# Patient Record
Sex: Male | Born: 1935 | Race: White | Hispanic: No | Marital: Single | Smoking: Former smoker
Health system: Southern US, Community
[De-identification: ages and names within clinical notes are randomized; demographics above are authoritative.]

## PROBLEM LIST (undated history)

## (undated) DIAGNOSIS — I351 Nonrheumatic aortic (valve) insufficiency: Secondary | ICD-10-CM

## (undated) DIAGNOSIS — Z720 Tobacco use: Secondary | ICD-10-CM

## (undated) DIAGNOSIS — C8510 Unspecified B-cell lymphoma, unspecified site: Secondary | ICD-10-CM

## (undated) DIAGNOSIS — I340 Nonrheumatic mitral (valve) insufficiency: Secondary | ICD-10-CM

## (undated) DIAGNOSIS — J439 Emphysema, unspecified: Secondary | ICD-10-CM

## (undated) DIAGNOSIS — E119 Type 2 diabetes mellitus without complications: Secondary | ICD-10-CM

## (undated) DIAGNOSIS — Z9221 Personal history of antineoplastic chemotherapy: Secondary | ICD-10-CM

## (undated) DIAGNOSIS — I6529 Occlusion and stenosis of unspecified carotid artery: Secondary | ICD-10-CM

## (undated) DIAGNOSIS — I2510 Atherosclerotic heart disease of native coronary artery without angina pectoris: Secondary | ICD-10-CM

## (undated) DIAGNOSIS — G5710 Meralgia paresthetica, unspecified lower limb: Secondary | ICD-10-CM

## (undated) DIAGNOSIS — I739 Peripheral vascular disease, unspecified: Secondary | ICD-10-CM

## (undated) DIAGNOSIS — K279 Peptic ulcer, site unspecified, unspecified as acute or chronic, without hemorrhage or perforation: Secondary | ICD-10-CM

## (undated) DIAGNOSIS — E785 Hyperlipidemia, unspecified: Secondary | ICD-10-CM

## (undated) DIAGNOSIS — D509 Iron deficiency anemia, unspecified: Secondary | ICD-10-CM

## (undated) DIAGNOSIS — D649 Anemia, unspecified: Secondary | ICD-10-CM

## (undated) DIAGNOSIS — J40 Bronchitis, not specified as acute or chronic: Secondary | ICD-10-CM

## (undated) DIAGNOSIS — I509 Heart failure, unspecified: Secondary | ICD-10-CM

## (undated) DIAGNOSIS — I10 Essential (primary) hypertension: Secondary | ICD-10-CM

## (undated) DIAGNOSIS — R600 Localized edema: Secondary | ICD-10-CM

## (undated) HISTORY — DX: Peptic ulcer, site unspecified, unspecified as acute or chronic, without hemorrhage or perforation: K27.9

## (undated) HISTORY — DX: Essential (primary) hypertension: I10

## (undated) HISTORY — DX: Localized edema: R60.0

## (undated) HISTORY — DX: Emphysema, unspecified: J43.9

## (undated) HISTORY — DX: Meralgia paresthetica, unspecified lower limb: G57.10

## (undated) HISTORY — DX: Occlusion and stenosis of unspecified carotid artery: I65.29

## (undated) HISTORY — DX: Iron deficiency anemia, unspecified: D50.9

## (undated) HISTORY — DX: Type 2 diabetes mellitus without complications: E11.9

## (undated) HISTORY — DX: Anemia, unspecified: D64.9

## (undated) HISTORY — DX: Bronchitis, not specified as acute or chronic: J40

## (undated) HISTORY — DX: Nonrheumatic aortic (valve) insufficiency: I35.1

## (undated) HISTORY — DX: Hyperlipidemia, unspecified: E78.5

## (undated) HISTORY — DX: Personal history of antineoplastic chemotherapy: Z92.21

## (undated) HISTORY — DX: Unspecified B-cell lymphoma, unspecified site: C85.10

## (undated) HISTORY — DX: Tobacco use: Z72.0

## (undated) HISTORY — DX: Atherosclerotic heart disease of native coronary artery without angina pectoris: I25.10

## (undated) HISTORY — DX: Nonrheumatic mitral (valve) insufficiency: I34.0

## (undated) HISTORY — DX: Peripheral vascular disease, unspecified: I73.9

---

## 2006-01-12 ENCOUNTER — Emergency Department: Payer: Self-pay | Admitting: Unknown Physician Specialty

## 2006-01-28 ENCOUNTER — Ambulatory Visit: Payer: Self-pay | Admitting: Surgery

## 2006-02-02 ENCOUNTER — Ambulatory Visit: Payer: Self-pay | Admitting: Surgery

## 2007-08-15 ENCOUNTER — Ambulatory Visit: Payer: Self-pay | Admitting: Cardiovascular Disease

## 2007-08-15 HISTORY — PX: CARDIAC CATHETERIZATION: SHX172

## 2007-11-30 DIAGNOSIS — C8510 Unspecified B-cell lymphoma, unspecified site: Secondary | ICD-10-CM

## 2007-11-30 HISTORY — DX: Unspecified B-cell lymphoma, unspecified site: C85.10

## 2008-09-03 ENCOUNTER — Ambulatory Visit: Payer: Self-pay | Admitting: Vascular Surgery

## 2008-10-09 ENCOUNTER — Ambulatory Visit: Payer: Self-pay | Admitting: Vascular Surgery

## 2008-10-16 ENCOUNTER — Inpatient Hospital Stay: Payer: Self-pay | Admitting: Vascular Surgery

## 2010-04-20 ENCOUNTER — Ambulatory Visit: Payer: Self-pay | Admitting: Rheumatology

## 2010-04-30 ENCOUNTER — Ambulatory Visit: Payer: Self-pay | Admitting: Oncology

## 2010-05-07 LAB — CBC WITH DIFFERENTIAL (CANCER CENTER ONLY)
BASO%: 0.5 % (ref 0.0–2.0)
Eosinophils Absolute: 0.3 10*3/uL (ref 0.0–0.5)
LYMPH#: 1.1 10*3/uL (ref 0.9–3.3)
LYMPH%: 15.8 % (ref 14.0–48.0)
MCV: 93 fL (ref 82–98)
MONO#: 0.5 10*3/uL (ref 0.1–0.9)
NEUT#: 4.8 10*3/uL (ref 1.5–6.5)
Platelets: 254 10*3/uL (ref 145–400)
RBC: 3.93 10*6/uL — ABNORMAL LOW (ref 4.20–5.70)
WBC: 6.7 10*3/uL (ref 4.0–10.0)

## 2010-05-07 LAB — CMP (CANCER CENTER ONLY)
AST: 18 U/L (ref 11–38)
Alkaline Phosphatase: 98 U/L — ABNORMAL HIGH (ref 26–84)
BUN, Bld: 30 mg/dL — ABNORMAL HIGH (ref 7–22)
Creat: 1.5 mg/dl — ABNORMAL HIGH (ref 0.6–1.2)
Glucose, Bld: 170 mg/dL — ABNORMAL HIGH (ref 73–118)
Total Bilirubin: 0.5 mg/dl (ref 0.20–1.60)

## 2010-05-07 LAB — MORPHOLOGY - CHCC SATELLITE
PLT EST ~~LOC~~: ADEQUATE
RBC Comments: NORMAL

## 2010-05-08 LAB — RETICULOCYTES (CHCC): Retic Ct Pct: 1.2 % (ref 0.4–3.1)

## 2010-05-08 LAB — IRON AND TIBC
%SAT: 16 % — ABNORMAL LOW (ref 20–55)
Iron: 49 ug/dL (ref 42–165)
UIBC: 255 ug/dL

## 2010-05-08 LAB — FERRITIN: Ferritin: 111 ng/mL (ref 22–322)

## 2010-05-08 LAB — BETA 2 MICROGLOBULIN, SERUM: Beta-2 Microglobulin: 2.87 mg/L — ABNORMAL HIGH (ref 1.01–1.73)

## 2010-05-13 ENCOUNTER — Ambulatory Visit: Payer: Self-pay | Admitting: Internal Medicine

## 2010-08-13 ENCOUNTER — Ambulatory Visit: Payer: Self-pay | Admitting: Oncology

## 2012-04-26 ENCOUNTER — Ambulatory Visit: Payer: Self-pay | Admitting: Cardiovascular Disease

## 2013-03-29 HISTORY — PX: ESOPHAGOGASTRODUODENOSCOPY: SHX1529

## 2013-03-29 HISTORY — PX: COLONOSCOPY: SHX174

## 2013-07-17 ENCOUNTER — Ambulatory Visit: Payer: Self-pay | Admitting: Cardiovascular Disease

## 2013-11-05 ENCOUNTER — Ambulatory Visit: Payer: Self-pay | Admitting: Internal Medicine

## 2013-11-19 ENCOUNTER — Ambulatory Visit: Payer: Self-pay | Admitting: Internal Medicine

## 2013-11-29 ENCOUNTER — Ambulatory Visit: Payer: Self-pay | Admitting: Internal Medicine

## 2014-04-25 ENCOUNTER — Ambulatory Visit: Payer: Self-pay | Admitting: Internal Medicine

## 2014-07-30 ENCOUNTER — Emergency Department: Payer: Self-pay | Admitting: Emergency Medicine

## 2015-10-14 ENCOUNTER — Inpatient Hospital Stay: Payer: Medicare PPO | Attending: Internal Medicine | Admitting: Internal Medicine

## 2015-10-14 ENCOUNTER — Encounter: Payer: Self-pay | Admitting: *Deleted

## 2015-10-14 ENCOUNTER — Other Ambulatory Visit: Payer: Self-pay | Admitting: *Deleted

## 2015-10-14 DIAGNOSIS — C8510 Unspecified B-cell lymphoma, unspecified site: Secondary | ICD-10-CM | POA: Insufficient documentation

## 2015-11-04 ENCOUNTER — Ambulatory Visit: Payer: Medicare PPO | Admitting: Internal Medicine

## 2015-12-25 ENCOUNTER — Inpatient Hospital Stay: Payer: Medicare PPO

## 2016-01-01 ENCOUNTER — Inpatient Hospital Stay: Payer: Medicare PPO | Attending: Internal Medicine

## 2016-01-24 ENCOUNTER — Encounter: Payer: Self-pay | Admitting: Emergency Medicine

## 2016-01-24 ENCOUNTER — Emergency Department
Admission: EM | Admit: 2016-01-24 | Discharge: 2016-01-24 | Disposition: A | Discharge status: Home / Self Care | Payer: Medicare PPO | Attending: Emergency Medicine | Admitting: Emergency Medicine

## 2016-01-24 ENCOUNTER — Emergency Department: Payer: Medicare PPO

## 2016-01-24 DIAGNOSIS — Z7984 Long term (current) use of oral hypoglycemic drugs: Secondary | ICD-10-CM | POA: Diagnosis not present

## 2016-01-24 DIAGNOSIS — D649 Anemia, unspecified: Secondary | ICD-10-CM | POA: Insufficient documentation

## 2016-01-24 DIAGNOSIS — Z7982 Long term (current) use of aspirin: Secondary | ICD-10-CM | POA: Insufficient documentation

## 2016-01-24 DIAGNOSIS — Y9289 Other specified places as the place of occurrence of the external cause: Secondary | ICD-10-CM | POA: Insufficient documentation

## 2016-01-24 DIAGNOSIS — Y998 Other external cause status: Secondary | ICD-10-CM | POA: Insufficient documentation

## 2016-01-24 DIAGNOSIS — Z79899 Other long term (current) drug therapy: Secondary | ICD-10-CM | POA: Insufficient documentation

## 2016-01-24 DIAGNOSIS — Z7902 Long term (current) use of antithrombotics/antiplatelets: Secondary | ICD-10-CM | POA: Insufficient documentation

## 2016-01-24 DIAGNOSIS — Z87891 Personal history of nicotine dependence: Secondary | ICD-10-CM | POA: Diagnosis not present

## 2016-01-24 DIAGNOSIS — S29002A Unspecified injury of muscle and tendon of back wall of thorax, initial encounter: Secondary | ICD-10-CM | POA: Diagnosis present

## 2016-01-24 DIAGNOSIS — S20221A Contusion of right back wall of thorax, initial encounter: Secondary | ICD-10-CM | POA: Insufficient documentation

## 2016-01-24 DIAGNOSIS — I10 Essential (primary) hypertension: Secondary | ICD-10-CM | POA: Insufficient documentation

## 2016-01-24 DIAGNOSIS — Y9389 Activity, other specified: Secondary | ICD-10-CM | POA: Diagnosis not present

## 2016-01-24 DIAGNOSIS — E785 Hyperlipidemia, unspecified: Secondary | ICD-10-CM | POA: Insufficient documentation

## 2016-01-24 DIAGNOSIS — E119 Type 2 diabetes mellitus without complications: Secondary | ICD-10-CM | POA: Insufficient documentation

## 2016-01-24 MED ORDER — TRAMADOL HCL 50 MG PO TABS: 50.0000 mg | ORAL_TABLET | Freq: Two times a day (BID) | ORAL | Status: DC | PRN

## 2016-01-24 MED ORDER — CARISOPRODOL 350 MG PO TABS: 350.0000 mg | ORAL_TABLET | Freq: Four times a day (QID) | ORAL | Status: DC | PRN
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
## 2016-03-18 ENCOUNTER — Telehealth: Payer: Self-pay | Admitting: Hematology and Oncology

## 2016-03-18 NOTE — Telephone Encounter (Signed)
Dr Alvy Bimler  Recommends pt to see Dr.  Mike Gip.

## 2016-04-29 ENCOUNTER — Other Ambulatory Visit: Payer: Self-pay | Admitting: Nephrology

## 2016-04-29 DIAGNOSIS — N183 Chronic kidney disease, stage 3 unspecified: Secondary | ICD-10-CM

## 2016-04-29 DIAGNOSIS — N179 Acute kidney failure, unspecified: Secondary | ICD-10-CM

## 2016-05-05 ENCOUNTER — Ambulatory Visit: Payer: Medicare PPO

## 2016-06-17 ENCOUNTER — Emergency Department: Payer: Medicare PPO

## 2016-06-17 ENCOUNTER — Inpatient Hospital Stay
Admission: EM | Admit: 2016-06-17 | Discharge: 2016-06-21 | DRG: 191 | Disposition: A | Discharge status: Home / Self Care | Payer: Medicare PPO | Attending: Internal Medicine | Admitting: Internal Medicine

## 2016-06-17 DIAGNOSIS — I25110 Atherosclerotic heart disease of native coronary artery with unstable angina pectoris: Secondary | ICD-10-CM | POA: Diagnosis present

## 2016-06-17 DIAGNOSIS — E1165 Type 2 diabetes mellitus with hyperglycemia: Secondary | ICD-10-CM | POA: Diagnosis present

## 2016-06-17 DIAGNOSIS — D509 Iron deficiency anemia, unspecified: Secondary | ICD-10-CM | POA: Diagnosis present

## 2016-06-17 DIAGNOSIS — Z9221 Personal history of antineoplastic chemotherapy: Secondary | ICD-10-CM

## 2016-06-17 DIAGNOSIS — Z9889 Other specified postprocedural states: Secondary | ICD-10-CM

## 2016-06-17 DIAGNOSIS — R0600 Dyspnea, unspecified: Secondary | ICD-10-CM | POA: Diagnosis not present

## 2016-06-17 DIAGNOSIS — I480 Paroxysmal atrial fibrillation: Secondary | ICD-10-CM | POA: Diagnosis present

## 2016-06-17 DIAGNOSIS — Z8711 Personal history of peptic ulcer disease: Secondary | ICD-10-CM

## 2016-06-17 DIAGNOSIS — T380X5A Adverse effect of glucocorticoids and synthetic analogues, initial encounter: Secondary | ICD-10-CM | POA: Diagnosis present

## 2016-06-17 DIAGNOSIS — Z79899 Other long term (current) drug therapy: Secondary | ICD-10-CM

## 2016-06-17 DIAGNOSIS — J449 Chronic obstructive pulmonary disease, unspecified: Secondary | ICD-10-CM | POA: Diagnosis present

## 2016-06-17 DIAGNOSIS — E1151 Type 2 diabetes mellitus with diabetic peripheral angiopathy without gangrene: Secondary | ICD-10-CM | POA: Diagnosis present

## 2016-06-17 DIAGNOSIS — I4892 Unspecified atrial flutter: Secondary | ICD-10-CM | POA: Diagnosis present

## 2016-06-17 DIAGNOSIS — J441 Chronic obstructive pulmonary disease with (acute) exacerbation: Secondary | ICD-10-CM | POA: Diagnosis present

## 2016-06-17 DIAGNOSIS — C8510 Unspecified B-cell lymphoma, unspecified site: Secondary | ICD-10-CM | POA: Diagnosis present

## 2016-06-17 DIAGNOSIS — I10 Essential (primary) hypertension: Secondary | ICD-10-CM | POA: Diagnosis present

## 2016-06-17 DIAGNOSIS — R0603 Acute respiratory distress: Secondary | ICD-10-CM

## 2016-06-17 DIAGNOSIS — Z7982 Long term (current) use of aspirin: Secondary | ICD-10-CM

## 2016-06-17 DIAGNOSIS — Z87891 Personal history of nicotine dependence: Secondary | ICD-10-CM

## 2016-06-17 DIAGNOSIS — R079 Chest pain, unspecified: Secondary | ICD-10-CM

## 2016-06-17 LAB — BASIC METABOLIC PANEL
ANION GAP: 8 (ref 5–15)
BUN: 36 mg/dL — AB (ref 6–20)
CALCIUM: 8.3 mg/dL — AB (ref 8.9–10.3)
CO2: 28 mmol/L (ref 22–32)
CREATININE: 1.72 mg/dL — AB (ref 0.61–1.24)
Chloride: 104 mmol/L (ref 101–111)
GFR calc Af Amer: 42 mL/min — ABNORMAL LOW (ref >60)
GFR, EST NON AFRICAN AMERICAN: 36 mL/min — AB (ref >60)
GLUCOSE: 143 mg/dL — AB (ref 65–99)
Potassium: 3.9 mmol/L (ref 3.5–5.1)
Sodium: 140 mmol/L (ref 135–145)

## 2016-06-17 LAB — CBC
HCT: 28.8 % — ABNORMAL LOW (ref 40.0–52.0)
HEMOGLOBIN: 9.5 g/dL — AB (ref 13.0–18.0)
MCH: 25.6 pg — AB (ref 26.0–34.0)
MCHC: 32.8 g/dL (ref 32.0–36.0)
MCV: 78.1 fL — ABNORMAL LOW (ref 80.0–100.0)
PLATELETS: 322 10*3/uL (ref 150–440)
RBC: 3.69 MIL/uL — ABNORMAL LOW (ref 4.40–5.90)
RDW: 18.7 % — AB (ref 11.5–14.5)
WBC: 11.6 10*3/uL — ABNORMAL HIGH (ref 3.8–10.6)

## 2016-06-17 LAB — TROPONIN I

## 2016-06-17 MED ORDER — ALBUTEROL SULFATE (2.5 MG/3ML) 0.083% IN NEBU
5.0000 mg | INHALATION_SOLUTION | Freq: Once | RESPIRATORY_TRACT | Status: AC
  Administered 2016-06-17: 5 mg via RESPIRATORY_TRACT

## 2016-06-17 MED ORDER — ASPIRIN 81 MG PO CHEW
324.0000 mg | CHEWABLE_TABLET | Freq: Once | ORAL | Status: AC
  Administered 2016-06-18: 324 mg via ORAL
  Filled 2016-06-17: qty 4

## 2016-06-17 MED ORDER — METHYLPREDNISOLONE SODIUM SUCC 125 MG IJ SOLR
125.0000 mg | Freq: Once | INTRAMUSCULAR | Status: AC
  Administered 2016-06-17: 125 mg via INTRAVENOUS

## 2016-06-17 MED ORDER — IPRATROPIUM-ALBUTEROL 0.5-2.5 (3) MG/3ML IN SOLN
3.0000 mL | Freq: Once | RESPIRATORY_TRACT | Status: AC
  Administered 2016-06-17: 3 mL via RESPIRATORY_TRACT

## 2016-06-17 MED ORDER — IPRATROPIUM-ALBUTEROL 0.5-2.5 (3) MG/3ML IN SOLN
RESPIRATORY_TRACT | Status: AC
  Filled 2016-06-17: qty 6

## 2016-06-17 MED ORDER — METHYLPREDNISOLONE SODIUM SUCC 125 MG IJ SOLR
INTRAMUSCULAR | Status: AC
  Filled 2016-06-17: qty 2

## 2016-06-17 MED ORDER — ALBUTEROL SULFATE (2.5 MG/3ML) 0.083% IN NEBU
INHALATION_SOLUTION | RESPIRATORY_TRACT | Status: AC
  Filled 2016-06-17: qty 6

## 2016-06-17 NOTE — ED Provider Notes (Signed)
[facility] Emergency Department Provider Note  ____________________________________________  Time seen: 11:00 PM  I have reviewed the triage vital signs and the nursing notes.   HISTORY  Chief Complaint Chest Pain and Shortness of Breath     HPI Brett [name] is a 81 y.o. male presents with acute onset of chest pain shortness of breath at 12:00 today. Patient states that the pain is central describes it as pressure radiating to his left neck and left arm. Patient also admits to dyspnea. Patient denies any auditory pain swelling. Patient denies any previous cardiac history or history of pulmonary emboli. Patient does admit to many years of tobacco use however stated that he quit in January 2017.     Past Medical History  Diagnosis Date  . Aortic regurgitation   . Mitral regurgitation   . Essential hypertension   . PVD (peripheral vascular disease) ([place])   . Bronchitis   . Hyperlipidemia   . Diabetes mellitus, type 2 ([place])   . CAD (coronary artery disease)   . B-cell lymphoma ([place]) 2009    DX AT [facility]  . History of chemotherapy   . PUD (peptic ulcer disease)   . Leg edema   . Meralgia paraesthetica   . Tobacco abuse   . Emphysema of lung ([place])   . Carotid stenosis   . Anemia   . IDA (iron deficiency anemia)     Patient Active Problem List   Diagnosis Date Noted  . B-cell lymphoma ([place]) 10/14/2015    Past Surgical History  Procedure Laterality Date  . Cardiac catheterization  08/15/2007  . Esophagogastroduodenoscopy  03/2013  . Colonoscopy  03/2013    Current Outpatient Rx  Name  Route  Sig  Dispense  Refill  . albuterol (VENTOLIN HFA) 108 (90 BASE) MCG/ACT inhaler      use as directed         . amLODipine (NORVASC) 5 MG tablet               . aspirin (ADULT ASPIRIN EC LOW STRENGTH) 81 MG EC tablet   Oral   Take 1 tablet by mouth daily.         . clopidogrel (PLAVIX) 75 MG tablet               . DHA-EPA-VITAMIN E  PO   Oral   Take by mouth.         . Fe Fum-FA-B Cmp-C-Zn-Mg-Mn-Cu (HEMOCYTE-PLUS) 106-1 MG TABS   Oral   Take 1 tablet by mouth daily.         . furosemide (LASIX) 40 MG tablet   Oral   Take 1 tablet by mouth daily.         Marland Kitchen gabapentin (NEURONTIN) 100 MG capsule   Oral   Take 1 capsule by mouth daily.         . Garlic [id number] MG CAPS   Oral   Take by mouth.         Marland Kitchen glimepiride (AMARYL) 1 MG tablet   Oral   Take 1 tablet by mouth daily.         . hydrALAZINE (APRESOLINE) 100 MG tablet               . hydrochlorothiazide (HYDRODIURIL) 25 MG tablet   Oral   Take 25 mg by mouth daily.      3   . HYDROcodone-ibuprofen (VICOPROFEN) 7.5-200 MG tablet               .  losartan-hydrochlorothiazide (HYZAAR) 100-25 MG tablet               . metoprolol (TOPROL-XL) 200 MG 24 hr tablet   Oral   Take 1 tablet by mouth daily.         Marland Kitchen omeprazole (PRILOSEC) 40 MG capsule   Oral   Take 1 capsule by mouth every morning.      1   . PREVNAR 13 SUSP injection      TO BE ADMINISTERED BY PHARMACIST FOR IMMUNIZATION      0     Dispense as written.   . simvastatin (ZOCOR) 40 MG tablet   Oral   Take by mouth.         . traMADol (ULTRAM) 50 MG tablet   Oral   Take 1 tablet (50 mg total) by mouth every 12 (twelve) hours as needed for moderate pain.   12 tablet   0   . traMADol (ULTRAM) 50 MG tablet   Oral   Take 1 tablet (50 mg total) by mouth every 12 (twelve) hours as needed for moderate pain.   12 tablet   0   . Vitamin D, Cholecalciferol, 1000 UNITS CAPS      once a day           Allergies No known drug allergies No family history on file.  Social History Social History  Substance Use Topics  . Smoking status: Former Smoker    Types: Cigarettes    Quit date: 08/30/2015  . Smokeless tobacco: Never Used  . Alcohol Use: No    Review of Systems  Constitutional: Negative for fever. Eyes: Negative for visual changes. ENT:  Negative for sore throat. Cardiovascular: Negative for chest pain. Respiratory: Negative for shortness of breath. Gastrointestinal: Negative for abdominal pain, vomiting and diarrhea. Genitourinary: Negative for dysuria. Musculoskeletal: Negative for back pain. Skin: Negative for rash. Neurological: Negative for headaches, focal weakness or numbness.   10-point ROS otherwise negative.  ____________________________________________   PHYSICAL EXAM:  VITAL SIGNS: ED Triage Vitals  Enc Vitals Group     BP 06/17/16 1812 122/48 mmHg     Pulse Rate 06/17/16 1812 86     Resp 06/17/16 1812 26     Temp 06/17/16 1812 98.2 F (36.8 C)     Temp Source 06/17/16 1812 Oral     SpO2 06/17/16 1812 94 %     Weight 06/17/16 1812 185 lb (83.915 kg)     Height --      Head Cir --      Peak Flow --      Pain Score 06/17/16 1812 7     Pain Loc --      Pain Edu? --      Excl. in [place]? --      Constitutional: Alert and oriented. Well appearing and in no distress. Eyes: Conjunctivae are normal. PERRL. Normal extraocular movements. ENT   Head: Normocephalic and atraumatic.   Nose: No congestion/rhinnorhea.   Mouth/Throat: Mucous membranes are moist.   Neck: No stridor. Hematological/Lymphatic/Immunilogical: No cervical lymphadenopathy. Cardiovascular: Normal rate, regular rhythm. Normal and symmetric distal pulses are present in all extremities. No murmurs, rubs, or gallops. Respiratory:Tachypnea with accessory respiratory muscle use. Coarse extremity wheezes Gastrointestinal: Soft and nontender. No distention. There is no CVA tenderness. Genitourinary: deferred Musculoskeletal: Nontender with normal range of motion in all extremities. No joint effusions.  No lower extremity tenderness nor edema. Neurologic:  Normal speech and language. No gross focal  neurologic deficits are appreciated. Speech is normal.  Skin:  Skin is warm, dry and intact. No rash noted. Psychiatric: Mood and  affect are normal. Speech and behavior are normal. Patient exhibits appropriate insight and judgment.  ____________________________________________    LABS (pertinent positives/negatives)  Labs Reviewed  BASIC METABOLIC PANEL - Abnormal; Notable for the following:    Glucose, Bld 143 (*)    BUN 36 (*)    Creatinine, Ser 1.72 (*)    Calcium 8.3 (*)    GFR calc non Af Amer 36 (*)    GFR calc Af Amer 42 (*)    All other components within normal limits  CBC - Abnormal; Notable for the following:    WBC 11.6 (*)    RBC 3.69 (*)    Hemoglobin 9.5 (*)    HCT 28.8 (*)    MCV 78.1 (*)    MCH 25.6 (*)    RDW 18.7 (*)    All other components within normal limits  TROPONIN I  BLOOD GAS, VENOUS     ____________________________________________   EKG ED ECG REPORT I, Yemas[place] N BROWN, the attending physician, personally viewed and interpreted this ECG.   Date: 06/18/2016  EKG Time: 6:11 PM  Rate: 87  Rhythm: Normal sinus rhythm  Axis: Normal  Intervals: Normal  ST&T Change: None   ____________________________________________    RADIOLOGY      DG Chest 2 View (Final result) Result time: 06/17/16 19:00:55   Final result by Rad Results In Interface (06/17/16 19:00:55)   Narrative:   CLINICAL DATA: Acute onset chest pain and shortness of breath today. Tachypnea.  EXAM: CHEST 2 VIEW  COMPARISON: None.  FINDINGS: Mild cardiomegaly noted. Mild bibasilar scarring or atelectasis noted. No evidence of pulmonary consolidation or edema. No evidence of pleural effusion.  IMPRESSION: Mild cardiomegaly and bibasilar scarring versus atelectasis. No evidence of pulmonary consolidation or edema.   Electronically Signed By: Earle Gell M.D. On: 06/17/2016 19:00          Procedures   Critical Care performed: CRITICAL CARE Performed by: Gregor Hams   Total critical care time: 30 minutes  Critical care time was exclusive of separately billable  procedures and treating other patients.  Critical care was necessary to treat or prevent imminent or life-threatening deterioration.  Critical care was time spent personally by me on the following activities: development of treatment plan with patient and/or surrogate as well as nursing, discussions with consultants, evaluation of patient's response to treatment, examination of patient, obtaining history from patient or surrogate, ordering and performing treatments and interventions, ordering and review of laboratory studies, ordering and review of radiographic studies, pulse oximetry and re-evaluation of patient's condition.   ____________________________________________   INITIAL IMPRESSION / ASSESSMENT AND PLAN / ED COURSE  Pertinent labs & imaging results that were available during my care of the patient were reviewed by me and considered in my medical decision making (see chart for details).  Patient apparent respiratory difficulty as such DuoNeb's 2 given his IV Solu-Medrol 125 mg. Patient given aspirin 324 mg  ____________________________________________   FINAL CLINICAL IMPRESSION(S) / ED DIAGNOSES  Final diagnoses:  Chest pain, unspecified chest pain type  Respiratory distress      Gregor Hams, MD 06/18/16 0126
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
## 2016-06-17 NOTE — ED Notes (Signed)
Pt c/o of right and left chest pain beginning at 12 pm today that radiates to neck, back, and both arms. Pt reports he had similar pain last Sunday. Pt reports SOB, weakness, dizziness/lightheadedness, and nausea accompany the pain. Pt is diaphoretic.

## 2016-06-17 NOTE — ED Notes (Signed)
Patient relations came to this RN to report concerns related to patient status; primary nurse has not been in with patient. Patient noted to be SOB in bed. Pulse oximeter attached; result 82%. Patient placed on supplemental oxygen at 2L/[place]; sats improved to 88%. Oxygen titrated up to 4L/[place]; sats 94%. Primary nurse made aware.

## 2016-06-18 ENCOUNTER — Encounter: Payer: Self-pay | Admitting: Internal Medicine

## 2016-06-18 DIAGNOSIS — R079 Chest pain, unspecified: Secondary | ICD-10-CM | POA: Diagnosis present

## 2016-06-18 DIAGNOSIS — J441 Chronic obstructive pulmonary disease with (acute) exacerbation: Secondary | ICD-10-CM | POA: Diagnosis present

## 2016-06-18 LAB — CBC
HEMATOCRIT: 28.5 % — AB (ref 40.0–52.0)
HEMOGLOBIN: 9.2 g/dL — AB (ref 13.0–18.0)
MCH: 25.3 pg — ABNORMAL LOW (ref 26.0–34.0)
MCHC: 32.4 g/dL (ref 32.0–36.0)
MCV: 78.3 fL — ABNORMAL LOW (ref 80.0–100.0)
Platelets: 308 10*3/uL (ref 150–440)
RBC: 3.65 MIL/uL — AB (ref 4.40–5.90)
RDW: 18.5 % — ABNORMAL HIGH (ref 11.5–14.5)
WBC: 14.1 10*3/uL — AB (ref 3.8–10.6)

## 2016-06-18 LAB — BASIC METABOLIC PANEL
Anion gap: 10 (ref 5–15)
BUN: 39 mg/dL — ABNORMAL HIGH (ref 6–20)
CALCIUM: 8.2 mg/dL — AB (ref 8.9–10.3)
CO2: 28 mmol/L (ref 22–32)
CREATININE: 2.04 mg/dL — AB (ref 0.61–1.24)
Chloride: 103 mmol/L (ref 101–111)
GFR calc Af Amer: 34 mL/min — ABNORMAL LOW (ref >60)
GFR calc non Af Amer: 29 mL/min — ABNORMAL LOW (ref >60)
GLUCOSE: 354 mg/dL — AB (ref 65–99)
Potassium: 4.5 mmol/L (ref 3.5–5.1)
Sodium: 141 mmol/L (ref 135–145)

## 2016-06-18 LAB — BLOOD GAS, VENOUS
Acid-Base Excess: 5.6 mmol/L — ABNORMAL HIGH (ref 0.0–3.0)
Bicarbonate: 31.1 mEq/L — ABNORMAL HIGH (ref 21.0–28.0)
O2 Saturation: 70.3 %
PCO2 VEN: 48 mmHg (ref 44.0–60.0)
PH VEN: 7.42 (ref 7.320–7.430)
PO2 VEN: 36 mmHg (ref 31.0–45.0)
Patient temperature: 37

## 2016-06-18 LAB — GLUCOSE, CAPILLARY
GLUCOSE-CAPILLARY: 383 mg/dL — AB (ref 65–99)
GLUCOSE-CAPILLARY: 432 mg/dL — AB (ref 65–99)
GLUCOSE-CAPILLARY: 468 mg/dL — AB (ref 65–99)
GLUCOSE-CAPILLARY: 326 mg/dL — AB (ref 65–99)
Glucose-Capillary: 363 mg/dL — ABNORMAL HIGH (ref 65–99)
Glucose-Capillary: 423 mg/dL — ABNORMAL HIGH (ref 65–99)
Glucose-Capillary: 275 mg/dL — ABNORMAL HIGH (ref 65–99)

## 2016-06-18 LAB — TROPONIN I: Troponin I: <0.03 ng/mL (ref <0.03)

## 2016-06-18 LAB — LIPID PANEL
Cholesterol: 121 mg/dL (ref 0–200)
HDL: 30 mg/dL — ABNORMAL LOW (ref >40)
LDL CALC: 63 mg/dL (ref 0–99)
Total CHOL/HDL Ratio: 4 RATIO
Triglycerides: 141 mg/dL (ref <150)
VLDL: 28 mg/dL (ref 0–40)

## 2016-06-18 LAB — FIBRIN DERIVATIVES D-DIMER (ARMC ONLY): FIBRIN DERIVATIVES D-DIMER (ARMC): 1781 — AB (ref 0–499)

## 2016-06-18 MED ORDER — CLOPIDOGREL BISULFATE 75 MG PO TABS
75.0000 mg | ORAL_TABLET | Freq: Every day | ORAL | Status: DC
  Administered 2016-06-18 – 2016-06-21 (×4): 75 mg via ORAL
  Filled 2016-06-18 (×4): qty 1

## 2016-06-18 MED ORDER — INSULIN ASPART 100 UNIT/ML ~~LOC~~ SOLN
0.0000 [IU] | Freq: Every day | SUBCUTANEOUS | Status: DC
  Administered 2016-06-18: 3 [IU] via SUBCUTANEOUS
  Filled 2016-06-18: qty 3

## 2016-06-18 MED ORDER — INSULIN ASPART 100 UNIT/ML ~~LOC~~ SOLN: 0.0000 [IU] | Freq: Every day | SUBCUTANEOUS | Status: DC

## 2016-06-18 MED ORDER — SODIUM CHLORIDE 0.9 % IV BOLUS (SEPSIS)
500.0000 mL | Freq: Once | INTRAVENOUS | Status: AC
  Administered 2016-06-18: 500 mL via INTRAVENOUS

## 2016-06-18 MED ORDER — ENOXAPARIN SODIUM 40 MG/0.4ML ~~LOC~~ SOLN
40.0000 mg | Freq: Every day | SUBCUTANEOUS | Status: DC
Start: 2016-06-18 — End: 2016-06-18
  Administered 2016-06-18: 40 mg via SUBCUTANEOUS
  Filled 2016-06-18: qty 0.4

## 2016-06-18 MED ORDER — SODIUM CHLORIDE 0.9% FLUSH: 3.0000 mL | INTRAVENOUS | Status: DC | PRN

## 2016-06-18 MED ORDER — INSULIN ASPART 100 UNIT/ML ~~LOC~~ SOLN
0.0000 [IU] | Freq: Three times a day (TID) | SUBCUTANEOUS | Status: DC
  Administered 2016-06-18: 20 [IU] via SUBCUTANEOUS
  Administered 2016-06-19: 11 [IU] via SUBCUTANEOUS
  Administered 2016-06-19: 10 [IU] via SUBCUTANEOUS
  Administered 2016-06-19: 20 [IU] via SUBCUTANEOUS
  Filled 2016-06-18 (×2): qty 20
  Filled 2016-06-18: qty 10
  Filled 2016-06-18: qty 11
  Filled 2016-06-18: qty 20

## 2016-06-18 MED ORDER — PANTOPRAZOLE SODIUM 40 MG PO TBEC
40.0000 mg | DELAYED_RELEASE_TABLET | Freq: Every day | ORAL | Status: DC
  Administered 2016-06-18 – 2016-06-21 (×4): 40 mg via ORAL
  Filled 2016-06-18 (×4): qty 1

## 2016-06-18 MED ORDER — ACETAMINOPHEN 325 MG PO TABS: 650.0000 mg | ORAL_TABLET | ORAL | Status: DC | PRN

## 2016-06-18 MED ORDER — ONDANSETRON HCL 4 MG/2ML IJ SOLN: 4.0000 mg | Freq: Four times a day (QID) | INTRAMUSCULAR | Status: DC | PRN

## 2016-06-18 MED ORDER — AMLODIPINE BESYLATE 5 MG PO TABS
5.0000 mg | ORAL_TABLET | Freq: Every day | ORAL | Status: DC
  Administered 2016-06-18: 5 mg via ORAL
  Filled 2016-06-18: qty 1

## 2016-06-18 MED ORDER — ATORVASTATIN CALCIUM 20 MG PO TABS
20.0000 mg | ORAL_TABLET | Freq: Every day | ORAL | Status: DC
  Administered 2016-06-18 – 2016-06-21 (×4): 20 mg via ORAL
  Filled 2016-06-18 (×5): qty 1

## 2016-06-18 MED ORDER — ASPIRIN 81 MG PO TBEC: 81.0000 mg | DELAYED_RELEASE_TABLET | Freq: Every day | ORAL | Status: DC

## 2016-06-18 MED ORDER — ASPIRIN EC 81 MG PO TBEC
81.0000 mg | DELAYED_RELEASE_TABLET | Freq: Every day | ORAL | Status: DC
  Administered 2016-06-19 – 2016-06-21 (×3): 81 mg via ORAL
  Filled 2016-06-18 (×4): qty 1

## 2016-06-18 MED ORDER — ENOXAPARIN SODIUM 40 MG/0.4ML ~~LOC~~ SOLN: 40.0000 mg | Freq: Every day | SUBCUTANEOUS | Status: DC

## 2016-06-18 MED ORDER — SODIUM CHLORIDE 0.9 % IV SOLN: 250.0000 mL | INTRAVENOUS | Status: DC | PRN

## 2016-06-18 MED ORDER — LOSARTAN POTASSIUM-HCTZ 100-25 MG PO TABS: 1.0000 | ORAL_TABLET | Freq: Every day | ORAL | Status: DC

## 2016-06-18 MED ORDER — ASPIRIN 81 MG PO CHEW: 324.0000 mg | CHEWABLE_TABLET | ORAL | Status: DC

## 2016-06-18 MED ORDER — METHYLPREDNISOLONE SODIUM SUCC 125 MG IJ SOLR
60.0000 mg | Freq: Four times a day (QID) | INTRAMUSCULAR | Status: DC
  Administered 2016-06-18 (×2): 60 mg via INTRAVENOUS
  Filled 2016-06-18 (×2): qty 2

## 2016-06-18 MED ORDER — HYDRALAZINE HCL 25 MG PO TABS
25.0000 mg | ORAL_TABLET | Freq: Three times a day (TID) | ORAL | Status: DC
  Administered 2016-06-18 – 2016-06-21 (×8): 25 mg via ORAL
  Filled 2016-06-18 (×12): qty 1

## 2016-06-18 MED ORDER — SODIUM CHLORIDE 0.9% FLUSH
3.0000 mL | Freq: Two times a day (BID) | INTRAVENOUS | Status: DC
  Administered 2016-06-18 – 2016-06-21 (×6): 3 mL via INTRAVENOUS

## 2016-06-18 MED ORDER — INSULIN ASPART 100 UNIT/ML ~~LOC~~ SOLN
10.0000 [IU] | Freq: Once | SUBCUTANEOUS | Status: AC
  Administered 2016-06-18: 10 [IU] via SUBCUTANEOUS
  Filled 2016-06-18: qty 10

## 2016-06-18 MED ORDER — IPRATROPIUM-ALBUTEROL 0.5-2.5 (3) MG/3ML IN SOLN
3.0000 mL | Freq: Four times a day (QID) | RESPIRATORY_TRACT | Status: DC
  Administered 2016-06-18 – 2016-06-21 (×12): 3 mL via RESPIRATORY_TRACT
  Filled 2016-06-18 (×13): qty 3

## 2016-06-18 MED ORDER — METHYLPREDNISOLONE SODIUM SUCC 40 MG IJ SOLR
40.0000 mg | Freq: Three times a day (TID) | INTRAMUSCULAR | Status: DC
  Administered 2016-06-18 – 2016-06-19 (×2): 40 mg via INTRAVENOUS
  Filled 2016-06-18 (×2): qty 1

## 2016-06-18 MED ORDER — NITROGLYCERIN 0.4 MG SL SUBL: 0.4000 mg | SUBLINGUAL_TABLET | SUBLINGUAL | Status: DC | PRN

## 2016-06-18 MED ORDER — ASPIRIN 300 MG RE SUPP: 300.0000 mg | RECTAL | Status: DC

## 2016-06-18 MED ORDER — HYDROCHLOROTHIAZIDE 25 MG PO TABS
25.0000 mg | ORAL_TABLET | Freq: Every day | ORAL | Status: DC
  Administered 2016-06-18 – 2016-06-21 (×4): 25 mg via ORAL
  Filled 2016-06-18 (×4): qty 1

## 2016-06-18 MED ORDER — INSULIN ASPART 100 UNIT/ML ~~LOC~~ SOLN
0.0000 [IU] | Freq: Three times a day (TID) | SUBCUTANEOUS | Status: DC
  Administered 2016-06-18 (×2): 15 [IU] via SUBCUTANEOUS
  Filled 2016-06-18 (×2): qty 15

## 2016-06-18 MED ORDER — GABAPENTIN 100 MG PO CAPS
100.0000 mg | ORAL_CAPSULE | Freq: Every day | ORAL | Status: DC
  Administered 2016-06-18 – 2016-06-20 (×3): 100 mg via ORAL
  Filled 2016-06-18 (×3): qty 1

## 2016-06-18 MED ORDER — LOSARTAN POTASSIUM 50 MG PO TABS
100.0000 mg | ORAL_TABLET | Freq: Every day | ORAL | Status: DC
  Administered 2016-06-18 – 2016-06-21 (×4): 100 mg via ORAL
  Filled 2016-06-18 (×5): qty 2

## 2016-06-18 MED ORDER — INSULIN GLARGINE 100 UNIT/ML ~~LOC~~ SOLN
5.0000 [IU] | Freq: Once | SUBCUTANEOUS | Status: AC
  Administered 2016-06-18: 5 [IU] via SUBCUTANEOUS
  Filled 2016-06-18: qty 0.05

## 2016-06-18 MED ORDER — GLIMEPIRIDE 1 MG PO TABS
1.0000 mg | ORAL_TABLET | Freq: Every day | ORAL | Status: DC
  Administered 2016-06-18 – 2016-06-21 (×4): 1 mg via ORAL
  Filled 2016-06-18 (×4): qty 1

## 2016-06-18 NOTE — Progress Notes (Signed)
Inpatient Diabetes Program Recommendations  AACE/ADA: New Consensus Statement on Inpatient Glycemic Control (2015)  Target Ranges:  Prepandial:   less than 140 mg/dL      Peak postprandial:   less than 180 mg/dL (1-2 hours)      Critically ill patients:  140 - 180 mg/dL   Lab Results  Component Value Date   GLUCAP 363* 06/18/2016    Review of Glycemic Control  Results for SABRINA, BUXBAUM (MRN [id number]) as of 06/18/2016 10:18  Ref. Range 06/18/2016 07:31  Glucose-Capillary Latest Ref Range: 65-99 mg/dL 363 (H)    Diabetes history: Type 2 Outpatient Diabetes medications: Amaryl 1mg /day Current orders for Inpatient glycemic control: Amaryl 1mg /day, Novolog moderate correction scale 0-15 units tid, Novolog 0-5 units qhs  Inpatient Diabetes Program Recommendations: Agree with current orders for blood sugar management. Please decrease correction insulin as steroids are tapered.   Gentry Fitz, RN, BA, MHA, CDE Diabetes Coordinator Inpatient Diabetes Program  [phone] (Team Pager) [phone] ([place]) 06/18/2016 10:34 AM

## 2016-06-18 NOTE — H&P (Signed)
[facility] [place] NAME: Brett [name]    MR#:  [id number]  DATE OF BIRTH:  February 12, 1935  DATE OF ADMISSION:  06/17/2016  PRIMARY CARE PHYSICIAN: Perrin Maltese, MD   REQUESTING/REFERRING PHYSICIAN:   CHIEF COMPLAINT:   Chief Complaint  Patient presents with  . Chest Pain  . Shortness of Breath    HISTORY OF PRESENT ILLNESS: Brett [name]  is a 81 y.o. male with a known history of Emphysema, aortic regurgitation, peripheral vascular disease, type 2 diabetes mellitus, hyperlipidemia presented to the emergency room with chest pain since yesterday afternoon. The pain is located in the left side of the chest and radiates to the shoulder. Patient also has occasional cough but no history of any fever or chills. Does not use any home oxygen. The chest pain is sharp in nature 4 out of 10 on a scale of 1-10. Patient follows up with Dr. [name] cardiologist as an outpatient. Patient was worked up with a troponin which was negative EKG shows normal sinus rhythm with no ST segment changes. Patient was short of breath in the emergency room was put on oxygen via nasal cannula at 3 L and he was given Solu-Medrol and neb treatments for COPD exacerbation. Hospitalist service was consulted for further care of the patient. No history of any headache dizziness or blurry vision. No history of any syncope or seizure.  PAST MEDICAL HISTORY:   Past Medical History  Diagnosis Date  . Aortic regurgitation   . Mitral regurgitation   . Essential hypertension   . PVD (peripheral vascular disease) ([place])   . Bronchitis   . Hyperlipidemia   . Diabetes mellitus, type 2 ([place])   . CAD (coronary artery disease)   . B-cell lymphoma ([place]) 2009    DX AT [facility]  . History of chemotherapy   . PUD (peptic ulcer disease)   . Leg edema   . Meralgia paraesthetica   . Tobacco abuse   . Emphysema of lung ([place])   . Carotid stenosis   . Anemia   . IDA (iron deficiency anemia)      PAST SURGICAL HISTORY: Past Surgical History  Procedure Laterality Date  . Cardiac catheterization  08/15/2007  . Esophagogastroduodenoscopy  03/2013  . Colonoscopy  03/2013    SOCIAL HISTORY:  Social History  Substance Use Topics  . Smoking status: Former Smoker    Types: Cigarettes    Quit date: 08/30/2015  . Smokeless tobacco: Never Used  . Alcohol Use: No    FAMILY HISTORY:  Family History  Problem Relation Age of Onset  . Rheum arthritis Neg Hx   . Osteoarthritis Neg Hx   . Asthma Neg Hx   . Diabetes Neg Hx   . Cancer Neg Hx     DRUG ALLERGIES: No Known Allergies  REVIEW OF SYSTEMS:   CONSTITUTIONAL: No fever, fatigue or weakness.  EYES: No blurred or double vision.  EARS, NOSE, AND THROAT: No tinnitus or ear pain.  RESPIRATORY: has occasional cough,has shortness of breath, has wheezing , no hemoptysis.  CARDIOVASCULAR: Has chest pain,no orthopnea, edema.  GASTROINTESTINAL: No nausea, vomiting, diarrhea or abdominal pain.  GENITOURINARY: No dysuria, hematuria.  ENDOCRINE: No polyuria, nocturia,  HEMATOLOGY: No anemia, easy bruising or bleeding SKIN: No rash or lesion. MUSCULOSKELETAL: No joint pain or arthritis.   NEUROLOGIC: No tingling, numbness, weakness.  PSYCHIATRY: No anxiety or depression.   MEDICATIONS AT HOME:  Prior to Admission medications  Medication Sig Start Date End Date Taking? Authorizing Provider  albuterol (VENTOLIN HFA) 108 (90 BASE) MCG/ACT inhaler inhale 1 puff into lungs everyday 09/19/15  Yes Historical Provider, MD  amLODipine (NORVASC) 5 MG tablet Take 5 mg by mouth daily.  08/27/15  Yes Historical Provider, MD  aspirin (ADULT ASPIRIN EC LOW STRENGTH) 81 MG EC tablet Take 1 tablet by mouth daily. 09/19/15  Yes Historical Provider, MD  atorvastatin (LIPITOR) 20 MG tablet Take 20 mg by mouth daily.   Yes Historical Provider, MD  clopidogrel (PLAVIX) 75 MG tablet Take 75 mg by mouth daily.  08/27/15  Yes Historical Provider, MD   furosemide (LASIX) 40 MG tablet Take 1 tablet by mouth daily. 09/22/15  Yes Historical Provider, MD  gabapentin (NEURONTIN) 100 MG capsule Take 1 capsule by mouth at bedtime.  09/22/15  Yes Historical Provider, MD  glimepiride (AMARYL) 1 MG tablet Take 1 tablet by mouth daily. 09/22/15  Yes Historical Provider, MD  hydrALAZINE (APRESOLINE) 100 MG tablet Take 300 mg by mouth 3 (three) times daily.  09/22/15  Yes Historical Provider, MD  hydrochlorothiazide (HYDRODIURIL) 25 MG tablet Take 25 mg by mouth daily. 09/16/15  Yes Historical Provider, MD  losartan-hydrochlorothiazide (HYZAAR) 100-25 MG tablet Take 1 tablet by mouth daily.  04/29/10  Yes Historical Provider, MD  metoprolol (TOPROL-XL) 200 MG 24 hr tablet Take 1 tablet by mouth daily. 09/22/15  Yes Historical Provider, MD  omeprazole (PRILOSEC) 40 MG capsule Take 1 capsule by mouth every morning. 09/19/15  Yes Historical Provider, MD  simvastatin (ZOCOR) 40 MG tablet Take 40 mg by mouth daily at 6 PM.  09/29/09  Yes Historical Provider, MD      PHYSICAL EXAMINATION:   VITAL SIGNS: Blood pressure 143/56, pulse 85, temperature 98.2 F (36.8 C), temperature source Oral, resp. rate 23, weight 83.915 kg (185 lb), SpO2 90 %.  GENERAL:  81 y.o.-year-old patient lying in the bed with no acute distress.  EYES: Pupils equal, round, reactive to light and accommodation. No scleral icterus. Extraocular muscles intact.  HEENT: Head atraumatic, normocephalic. Oropharynx and nasopharynx clear.  NECK:  Supple, no jugular venous distention. No thyroid enlargement, no tenderness.  LUNGS: Decreased breath sounds bilaterally,  Scattered wheezing in both lung fields . No use of accessory muscles of respiration.  CARDIOVASCULAR: S1, S2 normal. No murmurs, rubs, or gallops.  ABDOMEN: Soft, nontender, nondistended. Bowel sounds present. No organomegaly or mass.  EXTREMITIES: No pedal edema, cyanosis, or clubbing.  NEUROLOGIC: Cranial nerves II through XII are  intact. Muscle strength 5/5 in all extremities. Sensation intact. Gait not checked.  PSYCHIATRIC: The patient is alert and oriented x 3.  SKIN: No obvious rash, lesion, or ulcer.   LABORATORY PANEL:   CBC  Recent Labs Lab 06/17/16 1814  WBC 11.6*  HGB 9.5*  HCT 28.8*  PLT 322  MCV 78.1*  MCH 25.6*  MCHC 32.8  RDW 18.7*   ------------------------------------------------------------------------------------------------------------------  Chemistries   Recent Labs Lab 06/17/16 1814  NA 140  K 3.9  CL 104  CO2 28  GLUCOSE 143*  BUN 36*  CREATININE 1.72*  CALCIUM 8.3*   ------------------------------------------------------------------------------------------------------------------ estimated creatinine clearance is 36 mL/min (by C-G formula based on Cr of 1.72). ------------------------------------------------------------------------------------------------------------------ No results for input(s): TSH, T4TOTAL, T3FREE, THYROIDAB in the last 72 hours.  Invalid input(s): FREET3   Coagulation profile No results for input(s): INR, PROTIME in the last 168 hours. ------------------------------------------------------------------------------------------------------------------- No results for input(s): DDIMER in the last 72 hours. -------------------------------------------------------------------------------------------------------------------  Cardiac  Enzymes  Recent Labs Lab 06/17/16 1814  TROPONINI <0.03   ------------------------------------------------------------------------------------------------------------------ Invalid input(s): POCBNP  ---------------------------------------------------------------------------------------------------------------  Urinalysis No results found for: COLORURINE, APPEARANCEUR, LABSPEC, PHURINE, GLUCOSEU, HGBUR, BILIRUBINUR, KETONESUR, PROTEINUR, UROBILINOGEN, NITRITE, LEUKOCYTESUR   RADIOLOGY: Dg Chest 2  View  06/17/2016  CLINICAL DATA:  Acute onset chest pain and shortness of breath today. Tachypnea. EXAM: CHEST  2 VIEW COMPARISON:  None. FINDINGS: Mild cardiomegaly noted. Mild bibasilar scarring or atelectasis noted. No evidence of pulmonary consolidation or edema. No evidence of pleural effusion. IMPRESSION: Mild cardiomegaly and bibasilar scarring versus atelectasis. No evidence of pulmonary consolidation or edema. Electronically Signed   By: Earle Gell M.D.   On: 06/17/2016 19:00    EKG: Orders placed or performed during the hospital encounter of 06/17/16  . EKG 12-Lead  . EKG 12-Lead  . ED EKG  . ED EKG    IMPRESSION AND PLAN: 81 year old male patient with history of emphysema, peripheral vascular disease, diabetes mellitus, hypertension, aortic regurgitation presented to the emergency room with chest pain and cough. Admitting diagnosis 1. Unstable angina 2. Acute COPD exacerbation 3. Type 2 diabetes mellitus 4. Hypertension 5 peripheral vascular disease 6. B-cell lymphoma Treatment plan Admit patient to telemetry observation bed Cycle troponin to rule out ischemia IV Solu-Medrol 60 MG every 6 hourly Check echocardiogram Cardiology Consultation Aspirin 81 mg daily DVT prophylaxis subcutaneous Lovenox. Supportive care   All the records are reviewed and case discussed with ED provider. Management plans discussed with the patient, family and they are in agreement.  CODE STATUS:FULL Code Status History    This patient does not have a recorded code status. Please follow your organizational policy for patients in this situation.       TOTAL TIME TAKING CARE OF THIS PATIENT: 54 minutes.    Saundra Shelling M.D on 06/18/2016 at 2:26 AM  Between 7am to 6pm - Pager - [phone]  After 6pm go to [email or web address] - [email or web address] [facility]  Office  [phone]  CC: Primary care physician; Perrin Maltese, MD

## 2016-06-18 NOTE — Consult Note (Signed)
[facility] Card[facility]  CARDIOLOGY CONSULT NOTE  Patient ID: Brett [name] MRN: [id number] DOB/AGE: 1935-09-07 81 y.o.  Admit date: 06/17/2016 Referring Physician [facility] Primary Physician [facility] Cardiologist [name] Reason for Consultation Chest pain  HPI: 81 year old gentleman referred for evaluation of chest pain. She reports she underwent previous cardiac catheterization for 5 years ago which did not reveal significant coronary artery disease. Patient has known tobacco abuse, COPD on multiple inhaler therapy. He presents to [facility] emergency room with chest pain, pleuritic in nature, exacerbated by movement or cough, sharp in nature. ECG was nondiagnostic. The patient has ruled out for myocardial infarction by negative troponin. She was treated for COPD exacerbation, on oxygen therapy and intravenous Solu-Medrol.  Review of systems complete and found to be negative unless listed above     Past Medical History  Diagnosis Date  . Aortic regurgitation   . Mitral regurgitation   . Essential hypertension   . PVD (peripheral vascular disease) ([place])   . Bronchitis   . Hyperlipidemia   . Diabetes mellitus, type 2 ([place])   . CAD (coronary artery disease)   . B-cell lymphoma ([place]) 2009    DX AT [facility]  . History of chemotherapy   . PUD (peptic ulcer disease)   . Leg edema   . Meralgia paraesthetica   . Tobacco abuse   . Emphysema of lung ([place])   . Carotid stenosis   . Anemia   . IDA (iron deficiency anemia)     Past Surgical History  Procedure Laterality Date  . Cardiac catheterization  08/15/2007  . Esophagogastroduodenoscopy  03/2013  . Colonoscopy  03/2013    Prescriptions prior to admission  Medication Sig Dispense Refill Last Dose  . albuterol (VENTOLIN HFA) 108 (90 BASE) MCG/ACT inhaler inhale 1 puff into lungs everyday   06/17/2016 at Unknown time  . amLODipine (NORVASC) 5 MG tablet Take 5 mg by mouth daily.    06/17/2016 at Unknown time  . aspirin (ADULT ASPIRIN EC LOW STRENGTH)  81 MG EC tablet Take 1 tablet by mouth daily.   06/17/2016 at 0800  . atorvastatin (LIPITOR) 20 MG tablet Take 20 mg by mouth daily.   06/17/2016 at Unknown time  . clopidogrel (PLAVIX) 75 MG tablet Take 75 mg by mouth daily.    06/17/2016 at Unknown time  . furosemide (LASIX) 40 MG tablet Take 1 tablet by mouth daily.   06/17/2016 at Unknown time  . gabapentin (NEURONTIN) 100 MG capsule Take 1 capsule by mouth at bedtime.    Past Week at Unknown time  . glimepiride (AMARYL) 1 MG tablet Take 1 tablet by mouth daily.   06/17/2016 at Unknown time  . hydrALAZINE (APRESOLINE) 100 MG tablet Take 300 mg by mouth 3 (three) times daily.    06/17/2016 at Unknown time  . hydrochlorothiazide (HYDRODIURIL) 25 MG tablet Take 25 mg by mouth daily.  3 06/17/2016 at Unknown time  . losartan-hydrochlorothiazide (HYZAAR) 100-25 MG tablet Take 1 tablet by mouth daily.    06/17/2016 at Unknown time  . metoprolol (TOPROL-XL) 200 MG 24 hr tablet Take 1 tablet by mouth daily.   06/17/2016 at Unknown time  . omeprazole (PRILOSEC) 40 MG capsule Take 1 capsule by mouth every morning.  1 06/17/2016 at Unknown time  . simvastatin (ZOCOR) 40 MG tablet Take 40 mg by mouth daily at 6 PM.    06/17/2016 at Unknown time   Social History   Social History  . Marital Status: Single  Spouse Name: N/A  . Number of Children: N/A  . Years of Education: N/A   Occupational History  . retired    Social History Main Topics  . Smoking status: Former Smoker    Types: Cigarettes    Quit date: 08/30/2015  . Smokeless tobacco: Never Used  . Alcohol Use: No  . Drug Use: No  . Sexual Activity: Not on file   Other Topics Concern  . Not on file   Social History Narrative    Family History  Problem Relation Age of Onset  . Rheum arthritis Neg Hx   . Osteoarthritis Neg Hx   . Asthma Neg Hx   . Diabetes Neg Hx   . Cancer Neg Hx       Review of systems complete and found to be negative unless listed above      PHYSICAL  EXAM  General: Well developed, well nourished, in no acute distress HEENT:  Normocephalic and atramatic Neck:  No JVD.  Lungs: Clear bilaterally to auscultation and percussion. Heart: HRRR . Normal S1 and S2 without gallops or murmurs.  Abdomen: Bowel sounds are positive, abdomen soft and non-tender  Msk:  Back normal, normal gait. Normal strength and tone for age. Extremities: No clubbing, cyanosis or edema.   Neuro: Alert and oriented X 3. Psych:  Good affect, responds appropriately  Labs:   Lab Results  Component Value Date   WBC 14.1* 06/18/2016   HGB 9.2* 06/18/2016   HCT 28.5* 06/18/2016   MCV 78.3* 06/18/2016   PLT 308 06/18/2016    Recent Labs Lab 06/18/16 0413  NA 141  K 4.5  CL 103  CO2 28  BUN 39*  CREATININE 2.04*  CALCIUM 8.2*  GLUCOSE 354*   Lab Results  Component Value Date   TROPONINI <0.03 06/18/2016    Lab Results  Component Value Date   CHOL 121 06/18/2016   Lab Results  Component Value Date   HDL 30* 06/18/2016   Lab Results  Component Value Date   LDLCALC 63 06/18/2016   Lab Results  Component Value Date   TRIG 141 06/18/2016   Lab Results  Component Value Date   CHOLHDL 4.0 06/18/2016   No results found for: LDLDIRECT    Radiology: Dg Chest 2 View  06/17/2016  CLINICAL DATA:  Acute onset chest pain and shortness of breath today. Tachypnea. EXAM: CHEST  2 VIEW COMPARISON:  None. FINDINGS: Mild cardiomegaly noted. Mild bibasilar scarring or atelectasis noted. No evidence of pulmonary consolidation or edema. No evidence of pleural effusion. IMPRESSION: Mild cardiomegaly and bibasilar scarring versus atelectasis. No evidence of pulmonary consolidation or edema. Electronically Signed   By: Earle Gell M.D.   On: 06/17/2016 19:00    EKG: Sinus rhythm  ASSESSMENT AND PLAN:   1. Chest pain, atypical, pleuritic in nature, negative troponin, nondiagnostic ECG, likely noncardiac, likely secondary to COPD exacerbation 2. COPD  exacerbation, on nebulizer and IV Solu-Medrol therapy  Recommendations  1. Agree with current therapy 2. Defer full dose anticoagulation 3. Defer cardiac catheterization 4. Pending patient's clinical course, consider Lexiscan Myoview, which could be done as outpatient with Dr. [name]   Signed: Isaias Cowman MD,PhD, [facility] 06/18/2016, 9:00 AM

## 2016-06-18 NOTE — Care Management Obs Status (Signed)
[facility] NOTIFICATION   Patient Details  Name: Brett [name] MRN: [id number] Date of Birth: 1935/10/12   Medicare Observation Status Notification Given:  Yes    Jolly Mango, RN 06/18/2016, 10:05 AM

## 2016-06-18 NOTE — Progress Notes (Signed)
Dr. Margaretmary Eddy notified that CBG is still >420. Order for one time dose of lantus 5 units, novolog 10 units, 500cc NS bolus. Instructed to recheck CBG at 1900 to see if it starts trending down.

## 2016-06-18 NOTE — Progress Notes (Signed)
Brett [name] at [place] NAME: Brett [name]    MR#:  [id number]  DATE OF BIRTH:  Oct 24, 1935  SUBJECTIVE:  CHIEF COMPLAINT:  Patient is resting comfortably during my examination shortness of breath is better. Denies any chest pain. Quit smoking 8 months ago.  REVIEW OF SYSTEMS:  CONSTITUTIONAL: No fever, fatigue or weakness.  EYES: No blurred or double vision.  EARS, NOSE, AND THROAT: No tinnitus or ear pain.  RESPIRATORY: No cough, shortness of breath, wheezing or hemoptysis.  CARDIOVASCULAR: No chest pain, orthopnea, edema.  GASTROINTESTINAL: No nausea, vomiting, diarrhea or abdominal pain.  GENITOURINARY: No dysuria, hematuria.  ENDOCRINE: No polyuria, nocturia,  HEMATOLOGY: No anemia, easy bruising or bleeding SKIN: No rash or lesion. MUSCULOSKELETAL: No joint pain or arthritis.   NEUROLOGIC: No tingling, numbness, weakness.  PSYCHIATRY: No anxiety or depression.   DRUG ALLERGIES:  No Known Allergies  VITALS:  Blood pressure 118/48, pulse 95, temperature 98.1 F (36.7 C), temperature source Oral, resp. rate 19, weight 83.9 kg (184 lb 15.5 oz), SpO2 91 %.  PHYSICAL EXAMINATION:  GENERAL:  81 y.o.-year-old patient lying in the bed with no acute distress.  EYES: Pupils equal, round, reactive to light and accommodation. No scleral icterus. Extraocular muscles intact.  HEENT: Head atraumatic, normocephalic. Oropharynx and nasopharynx clear.  NECK:  Supple, no jugular venous distention. No thyroid enlargement, no tenderness.  LUNGS: Moderate  breath sounds bilaterally, minimal wheezing , norales,rhonchi or crepitation. No use of accessory muscles of respiration.  CARDIOVASCULAR: S1, S2 normal. No murmurs, rubs, or gallops.  ABDOMEN: Soft, nontender, nondistended. Bowel sounds present. No organomegaly or mass.  EXTREMITIES: No pedal edema, cyanosis, or clubbing.  NEUROLOGIC: Cranial nerves II through XII are intact. Muscle strength  5/5 in all extremities. Sensation intact. Gait not checked.  PSYCHIATRIC: The patient is alert and oriented x 3.  SKIN: No obvious rash, lesion, or ulcer.    LABORATORY PANEL:   CBC  Recent Labs Lab 06/18/16 0413  WBC 14.1*  HGB 9.2*  HCT 28.5*  PLT 308   ------------------------------------------------------------------------------------------------------------------  Chemistries   Recent Labs Lab 06/18/16 0413  NA 141  K 4.5  CL 103  CO2 28  GLUCOSE 354*  BUN 39*  CREATININE 2.04*  CALCIUM 8.2*   ------------------------------------------------------------------------------------------------------------------  Cardiac Enzymes  Recent Labs Lab 06/18/16 0946  TROPONINI <0.03   ------------------------------------------------------------------------------------------------------------------  RADIOLOGY:  Dg Chest 2 View  06/17/2016  CLINICAL DATA:  Acute onset chest pain and shortness of breath today. Tachypnea. EXAM: CHEST  2 VIEW COMPARISON:  None. FINDINGS: Mild cardiomegaly noted. Mild bibasilar scarring or atelectasis noted. No evidence of pulmonary consolidation or edema. No evidence of pleural effusion. IMPRESSION: Mild cardiomegaly and bibasilar scarring versus atelectasis. No evidence of pulmonary consolidation or edema. Electronically Signed   By: Earle Gell M.D.   On: 06/17/2016 19:00    EKG:   Orders placed or performed during the hospital encounter of 06/17/16  . EKG 12-Lead  . EKG 12-Lead  . ED EKG  . ED EKG    ASSESSMENT AND PLAN:    81 year old male patient with history of emphysema, peripheral vascular disease, diabetes mellitus, hypertension, aortic regurgitation presented to the emergency room with chest pain and cough.  #Acute COPD exacerbation Clinically better. Continue IV steroids, tapered slowly and continue breathing treatments Await clinical improvement  #Chest pain-probably from COPD exacerbation Nontrending troponins. Dr.  Doren [name] is recommending outpatient Lexiscan by patient's primary cardiologist Dr. Humphrey [name]  #Type  2 diabetes mellitus-sliding scale insulin follow-up with diabetic coordinator  # Hypertension-stable  #peripheral vascular disease  # B-cell lymphoma-outpatient follow-up with oncology as recommended DVT prophylaxis with Lovenox subcutaneous     All the records are reviewed and case discussed with Care Management/Social Workerr. Management plans discussed with the patient, family and they are in agreement.  CODE STATUS: FC  TOTAL TIME TAKING CARE OF THIS PATIENT: 36  minutes.   POSSIBLE D/C IN 2  DAYS, DEPENDING ON CLINICAL CONDITION.  Note: This dictation was prepared with Dragon dictation along with smaller phrase technology. Any transcriptional errors that result from this process are unintentional.   Brett [name] M.D on 06/18/2016 at 3:09 PM  Between 7am to 6pm - Pager - [phone] After 6pm go to [email or web address] - password [email or web address] [facility]  Office  [phone]  CC: Primary care physician; Brett Maltese, MD

## 2016-06-18 NOTE — Progress Notes (Signed)
CBG 432. Dr. Margaretmary Eddy notified. Order to change patient from moderate sliding scale to resistant sliding scale since patient is receiving steroids and to give the highest dose in that range (20 units).

## 2016-06-18 NOTE — ED Notes (Signed)
[name]  [phone]

## 2016-06-19 ENCOUNTER — Observation Stay (HOSPITAL_BASED_OUTPATIENT_CLINIC_OR_DEPARTMENT_OTHER)
Admit: 2016-06-19 | Discharge: 2016-06-19 | Disposition: A | Discharge status: Home / Self Care | Payer: Medicare PPO | Attending: Internal Medicine | Admitting: Internal Medicine

## 2016-06-19 DIAGNOSIS — R079 Chest pain, unspecified: Secondary | ICD-10-CM | POA: Diagnosis not present

## 2016-06-19 LAB — GLUCOSE, CAPILLARY
GLUCOSE-CAPILLARY: 450 mg/dL — AB (ref 65–99)
GLUCOSE-CAPILLARY: 256 mg/dL — AB (ref 65–99)
GLUCOSE-CAPILLARY: 263 mg/dL — AB (ref 65–99)
Glucose-Capillary: 289 mg/dL — ABNORMAL HIGH (ref 65–99)
Glucose-Capillary: 403 mg/dL — ABNORMAL HIGH (ref 65–99)
Glucose-Capillary: 390 mg/dL — ABNORMAL HIGH (ref 65–99)
Glucose-Capillary: 360 mg/dL — ABNORMAL HIGH (ref 65–99)

## 2016-06-19 LAB — BASIC METABOLIC PANEL
Anion gap: 11 (ref 5–15)
BUN: 68 mg/dL — ABNORMAL HIGH (ref 6–20)
CO2: 24 mmol/L (ref 22–32)
Calcium: 8.2 mg/dL — ABNORMAL LOW (ref 8.9–10.3)
Chloride: 102 mmol/L (ref 101–111)
Creatinine, Ser: 2.22 mg/dL — ABNORMAL HIGH (ref 0.61–1.24)
GFR calc Af Amer: 31 mL/min — ABNORMAL LOW (ref >60)
GFR calc non Af Amer: 26 mL/min — ABNORMAL LOW (ref >60)
Glucose, Bld: 492 mg/dL — ABNORMAL HIGH (ref 65–99)
Potassium: 4 mmol/L (ref 3.5–5.1)
Sodium: 137 mmol/L (ref 135–145)

## 2016-06-19 LAB — ECHOCARDIOGRAM COMPLETE: Weight: 2959.46 oz

## 2016-06-19 MED ORDER — HEPARIN SODIUM (PORCINE) 5000 UNIT/ML IJ SOLN
5000.0000 [IU] | Freq: Three times a day (TID) | INTRAMUSCULAR | Status: DC
  Administered 2016-06-19 – 2016-06-21 (×5): 5000 [IU] via SUBCUTANEOUS
  Filled 2016-06-19 (×5): qty 1

## 2016-06-19 MED ORDER — DILTIAZEM HCL 25 MG/5ML IV SOLN
5.0000 mg | Freq: Once | INTRAVENOUS | Status: AC
  Administered 2016-06-19: 5 mg via INTRAVENOUS
  Filled 2016-06-19: qty 5

## 2016-06-19 MED ORDER — SODIUM CHLORIDE 0.9 % IV SOLN
INTRAVENOUS | Status: AC
  Administered 2016-06-19 (×2): via INTRAVENOUS

## 2016-06-19 MED ORDER — INSULIN DETEMIR 100 UNIT/ML ~~LOC~~ SOLN
5.0000 [IU] | Freq: Once | SUBCUTANEOUS | Status: AC
  Administered 2016-06-19: 5 [IU] via SUBCUTANEOUS
  Filled 2016-06-19: qty 0.05

## 2016-06-19 MED ORDER — DILTIAZEM HCL 30 MG PO TABS
30.0000 mg | ORAL_TABLET | Freq: Four times a day (QID) | ORAL | Status: DC
  Administered 2016-06-19 – 2016-06-21 (×9): 30 mg via ORAL
  Filled 2016-06-19 (×9): qty 1

## 2016-06-19 MED ORDER — INSULIN ASPART 100 UNIT/ML ~~LOC~~ SOLN
0.0000 [IU] | Freq: Every day | SUBCUTANEOUS | Status: DC
  Administered 2016-06-19: 3 [IU] via SUBCUTANEOUS
  Administered 2016-06-20: 2 [IU] via SUBCUTANEOUS
  Filled 2016-06-19: qty 2
  Filled 2016-06-19: qty 3

## 2016-06-19 MED ORDER — SODIUM CHLORIDE 0.9 % IV SOLN
INTRAVENOUS | Status: AC
  Administered 2016-06-19: 15:00:00 via INTRAVENOUS

## 2016-06-19 MED ORDER — INSULIN ASPART 100 UNIT/ML ~~LOC~~ SOLN
20.0000 [IU] | Freq: Once | SUBCUTANEOUS | Status: AC
  Administered 2016-06-19: 20 [IU] via SUBCUTANEOUS

## 2016-06-19 MED ORDER — INSULIN ASPART 100 UNIT/ML ~~LOC~~ SOLN: 10.0000 [IU] | Freq: Once | SUBCUTANEOUS | Status: DC

## 2016-06-19 MED ORDER — INSULIN ASPART 100 UNIT/ML ~~LOC~~ SOLN
0.0000 [IU] | Freq: Three times a day (TID) | SUBCUTANEOUS | Status: DC
  Administered 2016-06-20: 15 [IU] via SUBCUTANEOUS
  Administered 2016-06-20: 11 [IU] via SUBCUTANEOUS
  Administered 2016-06-21: 4 [IU] via SUBCUTANEOUS
  Administered 2016-06-21: 7 [IU] via SUBCUTANEOUS
  Filled 2016-06-19: qty 7
  Filled 2016-06-19: qty 15
  Filled 2016-06-19: qty 4
  Filled 2016-06-19: qty 1

## 2016-06-19 MED ORDER — INSULIN GLARGINE 100 UNIT/ML ~~LOC~~ SOLN: 10.0000 [IU] | Freq: Every day | SUBCUTANEOUS | Status: DC

## 2016-06-19 MED ORDER — INSULIN GLARGINE 100 UNIT/ML ~~LOC~~ SOLN
10.0000 [IU] | Freq: Every day | SUBCUTANEOUS | Status: DC
  Administered 2016-06-19: 10 [IU] via SUBCUTANEOUS
  Filled 2016-06-19 (×3): qty 0.1

## 2016-06-19 MED ORDER — METHYLPREDNISOLONE SODIUM SUCC 40 MG IJ SOLR
40.0000 mg | INTRAMUSCULAR | Status: DC
  Administered 2016-06-20: 40 mg via INTRAVENOUS
  Filled 2016-06-19: qty 1

## 2016-06-19 NOTE — Progress Notes (Signed)
Pt blood glucose 263 tonight. MD Hugelmeyer notified. Verbal order given to place ACHS orders with resistant scale. RN to place orders and administer insulin as ordered. Will continue to monitor.   Iran Sizer M

## 2016-06-19 NOTE — Care Management Note (Addendum)
Case Management Note  Patient Details  Name: Brett [name] MRN: [id number] Date of Birth: November 28, 1935  Subjective/Objective:     Mr Piech's nurse called to report that the Discharge today has been cancelled per Dr Margaretmary Eddy. No new oxygen orders. Will reevaluate tomorrow for home oxygen.               Action/Plan:   Expected Discharge Date:                  Expected Discharge Plan:     In-House Referral:     Discharge planning Services     Post Acute Care Choice:    Choice offered to:     DME Arranged:    DME Agency:     HH Arranged:    HH Agency:     Status of Service:     If discussed at [employer] of Stay Meetings, dates discussed:    Additional Comments:  Annie Saephan A, RN 06/19/2016, 1:29 PM

## 2016-06-19 NOTE — Progress Notes (Signed)
Pt converted to NSR in the 80s.

## 2016-06-19 NOTE — Progress Notes (Signed)
HR still a fib 110s-120s, asymptomatic, VSS. MD Dr. Estanislado Pandy notified, orders placed to d/c amlodipine and begin cardizem PO first dose now. RN will continue to monitor. Rachael Fee, RN

## 2016-06-19 NOTE — Progress Notes (Signed)
CBG = 360.  Received 20 units novolog sliding scale.  Adjustments have been made to his insulin doses, hopefully this will bring his glucose under control.

## 2016-06-19 NOTE — Progress Notes (Signed)
Patient is alert, oriented, has cognitive deficiencies. Doesn't know routine things such as his height.  He is extremely hard of hearing, and I think responds to what he thinks he heard  Bilateral expiratory wheeze.  On O2 3L [place].  O2 sats are in the low 90's.

## 2016-06-19 NOTE — Progress Notes (Signed)
Pt remains a fib 110s with jumps into 120s, asymptomatic, VSS. MD Dr. Ara Kussmaul notified, orders placed for additional dose of 5mg  IVP cardizem. RN will administer and continue to monitor. Rachael Fee, RN

## 2016-06-19 NOTE — Progress Notes (Signed)
RN notified by CCMD that pt converted to a fib, sustaining HR 120s-130s with jumps into the 140s. VSS, pt asymptomatic. MD Dr. Ara Kussmaul notified, orders placed for 5mg  cardizem IVP once. RN will administer and continue to monitor. Rachael Fee, RN

## 2016-06-19 NOTE — Progress Notes (Signed)
Removed from oxygen in preparation for discharge.  O2 sat: 84.  O2 at 3L reapplied.  He pulled himself up in bed, O2 sat dropper to 82.  Oxygen at 1 L at rest resulted in O2 sat of 91,  Ambulation to room door on room air:  O2 sat = 86. Pulse 160.  Dr. Margaretmary Eddy notified

## 2016-06-19 NOTE — Progress Notes (Signed)
Patient has transitioned between NSR and A fib several times today.  When he is in A fib he is tachycardic.  Ambulating his from the bed to the door put him into A fib.  He reverted to NSR shortly after.

## 2016-06-19 NOTE — Progress Notes (Signed)
[place] at [place] NAME: Brett [name]    MR#:  [id number]  DATE OF BIRTH:  1935-08-17  SUBJECTIVE:  CHIEF COMPLAINT:  Patient is resting comfortably during my examination shortness of breath is better.Desaturated with ambulation today  REVIEW OF SYSTEMS:  CONSTITUTIONAL: No fever, fatigue or weakness.  EYES: No blurred or double vision.  EARS, NOSE, AND THROAT: No tinnitus or ear pain.  RESPIRATORY: No cough, shortness of breath, wheezing or hemoptysis.  CARDIOVASCULAR: No chest pain, orthopnea, edema.  GASTROINTESTINAL: No nausea, vomiting, diarrhea or abdominal pain.  GENITOURINARY: No dysuria, hematuria.  ENDOCRINE: No polyuria, nocturia,  HEMATOLOGY: No anemia, easy bruising or bleeding SKIN: No rash or lesion. MUSCULOSKELETAL: No joint pain or arthritis.   NEUROLOGIC: No tingling, numbness, weakness.  PSYCHIATRY: No anxiety or depression.   DRUG ALLERGIES:  No Known Allergies  VITALS:  Blood pressure 157/60, pulse 160, temperature 98 F (36.7 C), temperature source Oral, resp. rate 20, weight 83.9 kg (184 lb 15.5 oz), SpO2 86 %.  PHYSICAL EXAMINATION:  GENERAL:  81 y.o.-year-old patient lying in the bed with no acute distress.  EYES: Pupils equal, round, reactive to light and accommodation. No scleral icterus. Extraocular muscles intact.  HEENT: Head atraumatic, normocephalic. Oropharynx and nasopharynx clear.  NECK:  Supple, no jugular venous distention. No thyroid enlargement, no tenderness.  LUNGS: Moderate  breath sounds bilaterally, minimal wheezing , norales,rhonchi or crepitation. No use of accessory muscles of respiration.  CARDIOVASCULAR: S1, S2 normal. No murmurs, rubs, or gallops.  ABDOMEN: Soft, nontender, nondistended. Bowel sounds present. No organomegaly or mass.  EXTREMITIES: No pedal edema, cyanosis, or clubbing.  NEUROLOGIC: Cranial nerves II through XII are intact. Muscle strength 5/5 in all  extremities. Sensation intact. Gait not checked.  PSYCHIATRIC: The patient is alert and oriented x 3.  SKIN: No obvious rash, lesion, or ulcer.    LABORATORY PANEL:   CBC  Recent Labs Lab 06/18/16 0413  WBC 14.1*  HGB 9.2*  HCT 28.5*  PLT 308   ------------------------------------------------------------------------------------------------------------------  Chemistries   Recent Labs Lab 06/19/16 1239  NA 137  K 4.0  CL 102  CO2 24  GLUCOSE 492*  BUN 68*  CREATININE 2.22*  CALCIUM 8.2*   ------------------------------------------------------------------------------------------------------------------  Cardiac Enzymes  Recent Labs Lab 06/18/16 1538  TROPONINI <0.03   ------------------------------------------------------------------------------------------------------------------  RADIOLOGY:  Dg Chest 2 View  06/17/2016  CLINICAL DATA:  Acute onset chest pain and shortness of breath today. Tachypnea. EXAM: CHEST  2 VIEW COMPARISON:  None. FINDINGS: Mild cardiomegaly noted. Mild bibasilar scarring or atelectasis noted. No evidence of pulmonary consolidation or edema. No evidence of pleural effusion. IMPRESSION: Mild cardiomegaly and bibasilar scarring versus atelectasis. No evidence of pulmonary consolidation or edema. Electronically Signed   By: Earle Gell M.D.   On: 06/17/2016 19:00    EKG:   Orders placed or performed during the hospital encounter of 06/17/16  . EKG 12-Lead  . EKG 12-Lead  . ED EKG  . ED EKG    ASSESSMENT AND PLAN:    81 year old male patient with history of emphysema, peripheral vascular disease, diabetes mellitus, hypertension, aortic regurgitation presented to the emergency room with chest pain and cough.  #Acute COPD exacerbation  Clinically better but desaturated with min exertion   Continue IV steroids, taper slowly and continue breathing treatments Await clinical improvement  #Chest pain-probably from COPD  exacerbation Nontrending troponins. Dr. Doren Custard is recommending outpatient Lexiscan by patient's primary cardiologist Dr. [name]  #  Type 2 diabetes mellitus- hyperglycemia - steroid induced  Check Hb A1c  Lantus 10 units q hs  sliding scale insulin follow-up with diabetic coordinator  # Hypertension-stable  #peripheral vascular disease  # B-cell lymphoma-outpatient follow-up with oncology as recommended DVT prophylaxis with Lovenox subcutaneous     All the records are reviewed and case discussed with Care Management/Social Workerr. Management plans discussed with the patient, family and they are in agreement.  CODE STATUS: FC  TOTAL TIME TAKING CARE OF THIS PATIENT: 36  minutes.   POSSIBLE D/C IN 1-2  DAYS, DEPENDING ON CLINICAL CONDITION.  Note: This dictation was prepared with Dragon dictation along with smaller phrase technology. Any transcriptional errors that result from this process are unintentional.   [name] M.D on 06/19/2016 at 4:07 PM  Between 7am to 6pm - Pager - [phone] After 6pm go to [email or web address] - password [email or web address] [facility]  Office  [phone]  CC: Primary care physician; Perrin Maltese, MD

## 2016-06-19 NOTE — Progress Notes (Signed)
HR remains a fib 120s-130s, asymptomatic, VSS. MD Dr. Ara Kussmaul notified, orders placed for additional dose of 5mg  IVP cardizem. RN will administer and continue to monitor. Rachael Fee, RN

## 2016-06-19 NOTE — Progress Notes (Signed)
12:00 CBG = 450.  Dr. Margaretmary Eddy contacted. Stat glucose ordered = 492.  Instructed to give Novolog 20 units, levemir 5 units, and start NS at 13ml/hr. At 1420 CBG = 403. Dr. Margaretmary Eddy notified. Orders to give Novolog 10 units and give 500 ml  NS bolus.  At 1546 repeat CBG = 390. Dr. Margaretmary Eddy notified.

## 2016-06-20 DIAGNOSIS — C8510 Unspecified B-cell lymphoma, unspecified site: Secondary | ICD-10-CM | POA: Diagnosis present

## 2016-06-20 DIAGNOSIS — Z79899 Other long term (current) drug therapy: Secondary | ICD-10-CM | POA: Diagnosis not present

## 2016-06-20 DIAGNOSIS — Z8711 Personal history of peptic ulcer disease: Secondary | ICD-10-CM | POA: Diagnosis not present

## 2016-06-20 DIAGNOSIS — J441 Chronic obstructive pulmonary disease with (acute) exacerbation: Secondary | ICD-10-CM | POA: Diagnosis present

## 2016-06-20 DIAGNOSIS — I4892 Unspecified atrial flutter: Secondary | ICD-10-CM | POA: Diagnosis present

## 2016-06-20 DIAGNOSIS — Z9221 Personal history of antineoplastic chemotherapy: Secondary | ICD-10-CM | POA: Diagnosis not present

## 2016-06-20 DIAGNOSIS — I10 Essential (primary) hypertension: Secondary | ICD-10-CM | POA: Diagnosis present

## 2016-06-20 DIAGNOSIS — D509 Iron deficiency anemia, unspecified: Secondary | ICD-10-CM | POA: Diagnosis present

## 2016-06-20 DIAGNOSIS — E1151 Type 2 diabetes mellitus with diabetic peripheral angiopathy without gangrene: Secondary | ICD-10-CM | POA: Diagnosis present

## 2016-06-20 DIAGNOSIS — I25110 Atherosclerotic heart disease of native coronary artery with unstable angina pectoris: Secondary | ICD-10-CM | POA: Diagnosis present

## 2016-06-20 DIAGNOSIS — I480 Paroxysmal atrial fibrillation: Secondary | ICD-10-CM | POA: Diagnosis present

## 2016-06-20 DIAGNOSIS — E1165 Type 2 diabetes mellitus with hyperglycemia: Secondary | ICD-10-CM | POA: Diagnosis present

## 2016-06-20 DIAGNOSIS — Z87891 Personal history of nicotine dependence: Secondary | ICD-10-CM | POA: Diagnosis not present

## 2016-06-20 DIAGNOSIS — T380X5A Adverse effect of glucocorticoids and synthetic analogues, initial encounter: Secondary | ICD-10-CM | POA: Diagnosis present

## 2016-06-20 DIAGNOSIS — Z7982 Long term (current) use of aspirin: Secondary | ICD-10-CM | POA: Diagnosis not present

## 2016-06-20 DIAGNOSIS — R0600 Dyspnea, unspecified: Secondary | ICD-10-CM | POA: Diagnosis present

## 2016-06-20 DIAGNOSIS — J449 Chronic obstructive pulmonary disease, unspecified: Secondary | ICD-10-CM | POA: Diagnosis present

## 2016-06-20 DIAGNOSIS — Z9889 Other specified postprocedural states: Secondary | ICD-10-CM | POA: Diagnosis not present

## 2016-06-20 LAB — GLUCOSE, CAPILLARY
GLUCOSE-CAPILLARY: 419 mg/dL — AB (ref 65–99)
GLUCOSE-CAPILLARY: 408 mg/dL — AB (ref 65–99)
GLUCOSE-CAPILLARY: 369 mg/dL — AB (ref 65–99)
GLUCOSE-CAPILLARY: 331 mg/dL — AB (ref 65–99)
Glucose-Capillary: 268 mg/dL — ABNORMAL HIGH (ref 65–99)
Glucose-Capillary: 462 mg/dL — ABNORMAL HIGH (ref 65–99)
Glucose-Capillary: 212 mg/dL — ABNORMAL HIGH (ref 65–99)

## 2016-06-20 MED ORDER — INSULIN GLARGINE 100 UNIT/ML ~~LOC~~ SOLN
10.0000 [IU] | Freq: Two times a day (BID) | SUBCUTANEOUS | Status: DC
  Administered 2016-06-20 – 2016-06-21 (×3): 10 [IU] via SUBCUTANEOUS
  Filled 2016-06-20 (×4): qty 0.1

## 2016-06-20 MED ORDER — PREDNISONE 50 MG PO TABS
50.0000 mg | ORAL_TABLET | Freq: Every day | ORAL | Status: DC
  Administered 2016-06-21: 50 mg via ORAL
  Filled 2016-06-20: qty 1

## 2016-06-20 MED ORDER — INSULIN ASPART 100 UNIT/ML ~~LOC~~ SOLN
20.0000 [IU] | Freq: Once | SUBCUTANEOUS | Status: AC
  Administered 2016-06-20: 20 [IU] via SUBCUTANEOUS
  Filled 2016-06-20: qty 20

## 2016-06-20 MED ORDER — METOPROLOL SUCCINATE ER 50 MG PO TB24
50.0000 mg | ORAL_TABLET | Freq: Two times a day (BID) | ORAL | Status: DC
  Administered 2016-06-20 (×2): 50 mg via ORAL
  Filled 2016-06-20 (×2): qty 1

## 2016-06-20 MED ORDER — SODIUM CHLORIDE 0.9 % IV SOLN
INTRAVENOUS | Status: AC
Start: 2016-06-20 — End: 2016-06-20
  Administered 2016-06-20: 12:00:00 via INTRAVENOUS

## 2016-06-20 MED ORDER — SENNOSIDES-DOCUSATE SODIUM 8.6-50 MG PO TABS
1.0000 | ORAL_TABLET | Freq: Two times a day (BID) | ORAL | Status: DC
  Administered 2016-06-20 – 2016-06-21 (×3): 1 via ORAL
  Filled 2016-06-20 (×3): qty 1

## 2016-06-20 MED ORDER — INSULIN ASPART 100 UNIT/ML ~~LOC~~ SOLN
10.0000 [IU] | Freq: Once | SUBCUTANEOUS | Status: AC
  Administered 2016-06-20: 10 [IU] via SUBCUTANEOUS
  Filled 2016-06-20: qty 10

## 2016-06-20 NOTE — Progress Notes (Signed)
Ambulated one revolution of the unit.  Oxygen on at 3.5 liters.  Lowest O2 sat = 88. Patient was steady on his feet.  PT consult requested.

## 2016-06-20 NOTE — Progress Notes (Signed)
[place] [facility] [place] NAME: Brett [name]    MR#:  [id number]  DATE OF BIRTH:  [birth year]  SUBJECTIVE:  CHIEF COMPLAINT:  Patient is resting comfortably during my examinationIntermittent episodes of paroxysmal atrial fibrillation was noticed. Patient's shortness of breath is better.Desaturated to 88% on 3 L at room air  REVIEW OF SYSTEMS:  CONSTITUTIONAL: No fever, fatigue or weakness.  EYES: No blurred or double vision.  EARS, NOSE, AND THROAT: No tinnitus or ear pain.  RESPIRATORY: No cough, shortness of breath, wheezing or hemoptysis.  CARDIOVASCULAR: No chest pain, orthopnea, edema.  GASTROINTESTINAL: No nausea, vomiting, diarrhea or abdominal pain.  GENITOURINARY: No dysuria, hematuria.  ENDOCRINE: No polyuria, nocturia,  HEMATOLOGY: No anemia, easy bruising or bleeding SKIN: No rash or lesion. MUSCULOSKELETAL: No joint pain or arthritis.   NEUROLOGIC: No tingling, numbness, weakness.  PSYCHIATRY: No anxiety or depression.   DRUG ALLERGIES:  No Known Allergies  VITALS:  Blood pressure (!) 134/57, pulse 89, temperature 97.8 F (36.6 C), temperature source Oral, resp. rate (!) 24, height 5\' 10"  (1.778 m), weight 90.3 kg (199 lb), SpO2 93 %.  PHYSICAL EXAMINATION:  GENERAL:  81 y.o.-year-old patient lying in the bed with no acute distress.  EYES: Pupils equal, round, reactive to light and accommodation. No scleral icterus. Extraocular muscles intact.  HEENT: Head atraumatic, normocephalic. Oropharynx and nasopharynx clear.  NECK:  Supple, no jugular venous distention. No thyroid enlargement, no tenderness.  LUNGS: Moderate  breath sounds bilaterally, minimal end exp wheezing , norales,rhonchi or crepitation. No use of accessory muscles of respiration.  CARDIOVASCULAR: S1, S2 normal. No murmurs, rubs, or gallops.  ABDOMEN: Soft, nontender, nondistended. Bowel sounds present. No organomegaly or mass.  EXTREMITIES: No pedal  edema, cyanosis, or clubbing.  NEUROLOGIC: Cranial nerves II through XII are intact. Muscle strength 5/5 in all extremities. Sensation intact. Gait not checked.  PSYCHIATRIC: The patient is alert and oriented x 3.  SKIN: No obvious rash, lesion, or ulcer.    LABORATORY PANEL:   CBC  Recent Labs Lab 06/18/16 0413  WBC 14.1*  HGB 9.2*  HCT 28.5*  PLT 308   ------------------------------------------------------------------------------------------------------------------  Chemistries   Recent Labs Lab 06/19/16 1239  NA 137  K 4.0  CL 102  CO2 24  GLUCOSE 492*  BUN 68*  CREATININE 2.22*  CALCIUM 8.2*   ------------------------------------------------------------------------------------------------------------------  Cardiac Enzymes  Recent Labs Lab 06/18/16 1538  TROPONINI <0.03   ------------------------------------------------------------------------------------------------------------------  RADIOLOGY:  No results found.  EKG:   Orders placed or performed during the hospital encounter of 06/17/16  . EKG 12-Lead  . EKG 12-Lead  . ED EKG  . ED EKG    ASSESSMENT AND PLAN:    81 year old male patient with history of emphysema, peripheral vascular disease, diabetes mellitus, hypertension, aortic regurgitation presented to the emergency room with chest pain and cough.  #Acute COPD exacerbation -Desaturating with minimal exertion Incentive spirometry Ambulate patient with oxygen each shift Taper IV steroids, and continue breathing treatments Await clinical improvement  #Chest pain-probably from COPD exacerbation Nontrending troponins. Dr. Doren Custard is recommending outpatient Lexiscan by patient's primary cardiologist Dr. [name]  # paroxysmal atrial fibrillation Appreciate cardio recommendations  Resume metoprolol succinate 50 mg twice a day  Continue Cardizem for now Continue dual antiplatelet therapy. Hesitant to start chronic anticoagulation in the  setting of iron deficiency anemia. This can be further addressed as an outpatient.   #Type 2 diabetes mellitus- hyperglycemia - steroid induced  Check  Hb A1c  Lantus 10 units q 12 hours sliding scale insulin follow-up with diabetic coordinator  # Hypertension-stable  #peripheral vascular disease  # B-cell lymphoma-outpatient follow-up with oncology as recommended DVT prophylaxis with Lovenox subcutaneous     All the records are reviewed and case discussed with Care Management/Social Workerr. Management plans discussed with the patient, family and they are in agreement.  CODE STATUS: FC  TOTAL TIME TAKING CARE OF THIS PATIENT: 36  minutes.   POSSIBLE D/C IN 1-2  DAYS, DEPENDING ON CLINICAL CONDITION.  Note: This dictation was prepared with Dragon dictation along with smaller phrase technology. Any transcriptional errors that result from this process are unintentional.   Brett [name] M.D on 06/20/2016 at 12:06 PM  Between 7am to 6pm - Pager - [phone] After 6pm go to [email or web address] - password [email or web address] [facility]  Office  [phone]  CC: Primary care physician; Perrin Maltese, MD

## 2016-06-20 NOTE — Progress Notes (Addendum)
At 11:30 CBG = 462.  Dr. Margaretmary Eddy notified. Blood glucose ordered STAT. No S&S of hyperglycemia. 20 units of Novolog and 10 units Levemir given. At 13:15 CBG = 419.,At 14:40 CBG = 408. Additional Novolog 10 units given subq.

## 2016-06-20 NOTE — Progress Notes (Signed)
[facility] Cardiology  SUBJECTIVE: I don't have chest pain   Vitals:   06/20/16 0738 06/20/16 0953 06/20/16 0954 06/20/16 1006  BP: (!) 142/52 139/62    Pulse: (!) 108 (!) 106 (!) 112 (!) 124  Resp: (!) 24     Temp: 97.7 F (36.5 C)     TempSrc: Oral     SpO2: (!) 89% (!) 89% (!) 88% 93%  Weight:      Height:         Intake/Output Summary (Last 24 hours) at 06/20/16 1017 Last data filed at 06/20/16 0715  Gross per 24 hour  Intake          1466.67 ml  Output             1100 ml  Net           366.67 ml      PHYSICAL EXAM  General: Well developed, well nourished, in no acute distress HEENT:  Normocephalic and atramatic Neck:  No JVD.  Lungs: Clear bilaterally to auscultation and percussion. Heart: HRRR . Normal S1 and S2 without gallops or murmurs.  Abdomen: Bowel sounds are positive, abdomen soft and non-tender  Msk:  Back normal, normal gait. Normal strength and tone for age. Extremities: No clubbing, cyanosis or edema.   Neuro: Alert and oriented X 3. Psych:  Good affect, responds appropriately   LABS: Basic Metabolic Panel:  Recent Labs  06/18/16 0413 06/19/16 1239  NA 141 137  K 4.5 4.0  CL 103 102  CO2 28 24  GLUCOSE 354* 492*  BUN 39* 68*  CREATININE 2.04* 2.22*  CALCIUM 8.2* 8.2*   Liver Function Tests: No results for input(s): AST, ALT, ALKPHOS, BILITOT, PROT, ALBUMIN in the last 72 hours. No results for input(s): LIPASE, AMYLASE in the last 72 hours. CBC:  Recent Labs  06/17/16 1814 06/18/16 0413  WBC 11.6* 14.1*  HGB 9.5* 9.2*  HCT 28.8* 28.5*  MCV 78.1* 78.3*  PLT 322 308   Cardiac Enzymes:  Recent Labs  06/18/16 0413 06/18/16 0946 06/18/16 1538  TROPONINI <0.03 <0.03 <0.03   BNP: Invalid input(s): POCBNP D-Dimer: No results for input(s): DDIMER in the last 72 hours. Hemoglobin A1C: No results for input(s): HGBA1C in the last 72 hours. Fasting Lipid Panel:  Recent Labs  06/18/16 0413  CHOL 121  HDL 30*  LDLCALC 63   TRIG 141  CHOLHDL 4.0   Thyroid Function Tests: No results for input(s): TSH, T4TOTAL, T3FREE, THYROIDAB in the last 72 hours.  Invalid input(s): FREET3 Anemia Panel: No results for input(s): VITAMINB12, FOLATE, FERRITIN, TIBC, IRON, RETICCTPCT in the last 72 hours.  No results found.   Echo LV ejection fraction 60-65%  TELEMETRY: Atrial flutter at a rate of 110-120 bpm:  ASSESSMENT AND PLAN:  Principal Problem:   Chest pain Active Problems:   COPD with acute exacerbation (HCC)   COPD exacerbation (HCC)    1. Atypical chest pain, pleuritic, negative troponin, nondiagnostic ECG, currently resolved 2. Paroxysmal atrial flutter, variable heart rate, 110-120 BPM, asymptomatic 3. COPD exacerbation, improved  Recommendations  1. Continue current medications 2. Resume metoprolol succinate 50 mg twice a day 3. Continue Cardizem for now 4. Continue dual antiplatelet therapy. Hesitant to start chronic anticoagulation in the setting of iron deficiency anemia. This can be further addressed as an outpatient.   Isaias Cowman, MD, PhD, [facility] 06/20/2016 10:17 AM

## 2016-06-20 NOTE — Progress Notes (Signed)
Shifted to Afib a few minutes ago, pulse was 101.  Converted back to NSR quickly. Pulse 83.

## 2016-06-21 LAB — GLUCOSE, CAPILLARY
GLUCOSE-CAPILLARY: 183 mg/dL — AB (ref 65–99)
GLUCOSE-CAPILLARY: 234 mg/dL — AB (ref 65–99)

## 2016-06-21 LAB — HEMOGLOBIN A1C: HEMOGLOBIN A1C: 7.7 % — AB (ref 4.0–6.0)

## 2016-06-21 MED ORDER — DILTIAZEM HCL ER COATED BEADS 180 MG PO CP24: 180.0000 mg | ORAL_CAPSULE | Freq: Every day | ORAL | 0 refills | Status: DC

## 2016-06-21 MED ORDER — SODIUM CHLORIDE 0.9 % IJ SOLN
INTRAMUSCULAR | Status: AC
  Filled 2016-06-21: qty 10

## 2016-06-21 MED ORDER — HYDRALAZINE HCL 25 MG PO TABS: 50.0000 mg | ORAL_TABLET | Freq: Three times a day (TID) | ORAL | 0 refills | Status: DC

## 2016-06-21 MED ORDER — PREDNISONE 10 MG (21) PO TBPK: 10.0000 mg | ORAL_TABLET | Freq: Every day | ORAL | 0 refills | Status: DC

## 2016-06-21 MED ORDER — SENNOSIDES-DOCUSATE SODIUM 8.6-50 MG PO TABS: 1.0000 | ORAL_TABLET | Freq: Two times a day (BID) | ORAL | Status: DC

## 2016-06-21 MED ORDER — INSULIN GLARGINE 100 UNIT/ML ~~LOC~~ SOLN: 7.0000 [IU] | Freq: Two times a day (BID) | SUBCUTANEOUS | 11 refills | Status: DC

## 2016-06-21 MED ORDER — FLUTICASONE-SALMETEROL 250-50 MCG/DOSE IN AEPB: 1.0000 | INHALATION_SPRAY | Freq: Two times a day (BID) | RESPIRATORY_TRACT | 0 refills | Status: DC

## 2016-06-21 MED ORDER — DILTIAZEM HCL ER COATED BEADS 180 MG PO CP24
180.0000 mg | ORAL_CAPSULE | Freq: Every day | ORAL | Status: DC
  Administered 2016-06-21: 180 mg via ORAL
  Filled 2016-06-21: qty 1

## 2016-06-21 MED ORDER — METOPROLOL SUCCINATE ER 100 MG PO TB24: 100.0000 mg | ORAL_TABLET | Freq: Every day | ORAL | Status: DC

## 2016-06-21 NOTE — Progress Notes (Signed)
[facility] Card[facility]  SUBJECTIVE: I want to go home   Vitals:   06/21/16 0134 06/21/16 0411 06/21/16 0616 06/21/16 0827  BP:  126/60    Pulse:  69 80   Resp:  15    Temp:  98 F (36.7 C)    TempSrc:  Oral    SpO2: 93% 92% 92% 93%  Weight:      Height:         Intake/Output Summary (Last 24 hours) at 06/21/16 0831 Last data filed at 06/21/16 0700  Gross per 24 hour  Intake           2340.5 ml  Output             2600 ml  Net           -259.5 ml      PHYSICAL EXAM  General: Well developed, well nourished, in no acute distress HEENT:  Normocephalic and atramatic Neck:  No JVD.  Lungs: Clear bilaterally to auscultation and percussion. Heart: HRRR . Normal S1 and S2 without gallops or murmurs.  Abdomen: Bowel sounds are positive, abdomen soft and non-tender  Msk:  Back normal, normal gait. Normal strength and tone for age. Extremities: No clubbing, cyanosis or edema.   Neuro: Alert and oriented X 3. Psych:  Good affect, responds appropriately   LABS: Basic Metabolic Panel:  Recent Labs  06/19/16 1239  NA 137  K 4.0  CL 102  CO2 24  GLUCOSE 492*  BUN 68*  CREATININE 2.22*  CALCIUM 8.2*   Liver Function Tests: No results for input(s): AST, ALT, ALKPHOS, BILITOT, PROT, ALBUMIN in the last 72 hours. No results for input(s): LIPASE, AMYLASE in the last 72 hours. CBC: No results for input(s): WBC, NEUTROABS, HGB, HCT, MCV, PLT in the last 72 hours. Cardiac Enzymes:  Recent Labs  06/18/16 0946 06/18/16 1538  TROPONINI <0.03 <0.03   BNP: Invalid input(s): POCBNP D-Dimer: No results for input(s): DDIMER in the last 72 hours. Hemoglobin A1C: No results for input(s): HGBA1C in the last 72 hours. Fasting Lipid Panel: No results for input(s): CHOL, HDL, LDLCALC, TRIG, CHOLHDL, LDLDIRECT in the last 72 hours. Thyroid Function Tests: No results for input(s): TSH, T4TOTAL, T3FREE, THYROIDAB in the last 72 hours.  Invalid input(s): FREET3 Anemia Panel: No  results for input(s): VITAMINB12, FOLATE, FERRITIN, TIBC, IRON, RETICCTPCT in the last 72 hours.  No results found.   Echo Normal left ventricular function, LV ejection fraction 60-65%  TELEMETRY: Sinus rhythm:  ASSESSMENT AND PLAN:  Principal Problem:   Chest pain Active Problems:   COPD with acute exacerbation (HCC)   COPD exacerbation (HCC)   COPD (chronic obstructive pulmonary disease) ([place])    1. Atypical chest pain, pleuritic, negative troponin, nondiagnostic ECG, resolved 2. Paroxysmal atrial flutter, variable heart rate, currently in sinus rhythm 3. COPD exacerbation, improved  Recommendations  1. Continue current medications 2. Continue metoprolol and Cardizem for rate and rhythm control 3. Continue dual antiplatelet therapy. Hesitant to start chronic anticoagulation in the setting of iron deficiency anemia, which can be further addressed as an outpatient.  Sign off for now, please call if any questions   Kanoelani Dobies, MD, PhD, [facility] 06/21/2016 8:31 AM

## 2016-06-21 NOTE — Care Management Important Message (Signed)
Important Message  Patient Details  Name: Brett [name] MRN: [id number] Date of Birth: 10-14-1935   Medicare Important Message Given:  Yes    Jolly Mango, RN 06/21/2016, 9:56 AM

## 2016-06-21 NOTE — Care Management (Signed)
Met with patient at bedside. He is sitting up in chair.Extremely hard of hearing. Just worked with PT who recommended home health PT. Would benefit from nursing due to COPD and new home O2. Patient has no agency preference. Referral to [facility] for [facility] SN and PT and home O2. Patient lives at home alone. He uses no DME. He has a friend that is his support system and takes him to appointments.PCP is Wolfgang Phoenix.

## 2016-06-21 NOTE — Progress Notes (Signed)
Ambulated around nurses station earlier this morning Oxygen saturation down to 88 with exertion, 92% at rest on 3.[place]

## 2016-06-21 NOTE — Progress Notes (Signed)
Patient discharged home with home health services ,instructions explained and well understood,follow up appointments made,vital signs within normal limits,oxygen per nasal canula 3.5 liters,escorted by son and staff member via wheel chair.

## 2016-06-21 NOTE — Discharge Instructions (Signed)
Activity as tolerated with 3 L of oxygen via nasal cannula, as per PT recommendations Diet diabetic Follow-up with primary care physician in a week Follow-up with cardiology Dr. Lorinda Creed in a week Follow-up with pulmonology Dr. Mortimer Fries 2 weeks Follow-up with primary oncologist in 1 week or as recommended

## 2016-06-21 NOTE — Evaluation (Signed)
Physical Therapy Evaluation Patient Details Name: Brett [name] MRN: [id number] DOB: 1935/06/18 Today's Date: 06/21/2016   History of Present Illness  Pt is a 81 yr old male presenting with L sided chest pain radiating to posterior neck and shoulder and SOB. Admitted with acute COPD exacerbation; troponins found to be non-trending. PMH significant for emphysema, aortic regurgitation, PVD, DM, HLD and h/o B cell lymphoma.      Clinical Impression  Prior to admission, pt was independent with all functional mobility and ADLs.  Pt lives alone in a one story home with 3 STE.  Currently pt is mod I with O2 for bed mobility and transfers, and CGA for ambulation x 339ft. On 3L O2, pt desaturated from 92% at rest to 89% following ambulation, but quickly recovered to 93% with seated rest. RN and MD notified. Pt would benefit from from pulmonary rehabilitation for increased pulmonary activity tolerance and O2 management education.       Follow Up Recommendations  (Pulmonary Rehab)    Equipment Recommendations  None recommended by PT    Recommendations for Other Services       Precautions / Restrictions Precautions Precautions: Fall Restrictions Weight Bearing Restrictions: No      Mobility  Bed Mobility Overal bed mobility: Modified Independent General bed mobility comments: With 3L O2, pt able to achieve supine > sit from flat surface without assist.  Transfers Overall transfer level: Modified independent Equipment used:  (3L O2) General transfer comment: Pt performs sit <> stand transfer from EOB without difficulty or physical assist.  Ambulation/Gait Ambulation/Gait assistance: Min guard Ambulation Distance (Feet): 330 Feet Assistive device:  (3L O2) Gait Pattern/deviations: WFL(Within Functional Limits);Step-through pattern Gait velocity: WFL General Gait Details: Able to ambulate 2x around RN station with CGA for safety/O2 management. Performs head turns in all directions  without change in cadence or LOB.   Stairs    Wheelchair Mobility    Modified Rankin (Stroke Patients Only)     Balance Overall balance assessment: Needs assistance Sitting-balance support: Feet supported Sitting balance-Leahy Scale: Good Standing balance support: No upper extremity supported Standing balance-Leahy Scale: Good High level balance activites: Head turns High Level Balance Comments: No LOB or instability noted      Pertinent Vitals/Pain Pain Assessment: No/denies pain Resting: SaO2 on 3L 92%, HR 78 bpm Following TherEx:  SaO2 on 3L 91%, HR 81 bpm Following ambulation: SaO2 on 3L 89%, HR 100 bpm End of session: SaO2 on 3L 93%, HR 86 bpm    Home Living Family/patient expects to be discharged to:: Private residence Living Arrangements: Alone Available Help at Discharge: Friend(s);Available PRN/intermittently Type of Home: House Home Access: Stairs to enter Entrance Stairs-Rails: Left Entrance Stairs-Number of Steps: 3 Home Layout: One level Home Equipment: None      Prior Function Level of Independence: Independent  Not using oxygen at baseline         Extremity/Trunk Assessment   Upper Extremity Assessment: Overall WFL for tasks assessed   Lower Extremity Assessment: Overall WFL for tasks assessed (Strength at least 3/5 throughout bilat LEs)  Cervical / Trunk Assessment: Normal    Communication   Communication: HOH  Cognition Arousal/Alertness: Awake/alert Behavior During Therapy: WFL for tasks assessed/performed Overall Cognitive Status: Within Functional Limits for tasks assessed    General Comments Nursing cleared pt for participation in physical therapy.  Pt agreeable to PT session.     Exercises General Exercises - Lower Extremity Ankle Circles/Pumps: AROM Quad Sets: AROM Short Arc Quad:  AROM Heel Slides: AROM Hip ABduction/ADduction: AROM Straight Leg Raises: AROM  All exercises performed bilaterally x 10 reps in supine.        Assessment/Plan    PT Assessment Patient needs continued PT services  PT Diagnosis  (Cardiopulmonary status limiting activity)   PT Problem List Decreased activity tolerance;Decreased mobility;Cardiopulmonary status limiting activity  PT Treatment Interventions Gait training;Stair training;Functional mobility training;Therapeutic activities;Therapeutic exercise;Balance training;Patient/family education   PT Goals (Current goals can be found in the Care Plan section) Acute Rehab PT Goals Patient Stated Goal: I'm ready to go home PT Goal Formulation: With patient Time For Goal Achievement: 07/05/16 Potential to Achieve Goals: Good    Frequency Min 2X/week   Barriers to discharge           End of Session Equipment Utilized During Treatment: Gait belt;Oxygen (3L O2) Activity Tolerance: Patient tolerated treatment well Patient left: in chair;with call bell/phone within reach;with chair alarm set Nurse Communication: Mobility status (O2 monitoring)         Time: [id number] PT Time Calculation (min) (ACUTE ONLY): 36 min   Charges:         PT G Codes:        Esteen Delpriore, SPT 06/21/2016, 1:13 PM

## 2016-06-21 NOTE — Progress Notes (Signed)
SATURATION QUALIFICATIONS: (This note is used to comply with regulatory documentation for home oxygen)  Patient Saturations on Room Air at Rest =88%  Patient Saturations on Room Air while Ambulating =NA%  Patient Saturations on 3.5 Liters of oxygen while Ambulating =93%  Please briefly explain why patient needs home oxygen:

## 2016-06-21 NOTE — Discharge Summary (Addendum)
[facility] NAME: Brett [name]    MR#:  [id number]  DATE OF BIRTH:  1935-03-08  DATE OF ADMISSION:  06/17/2016 ADMITTING PHYSICIAN: Saundra Shelling, MD  DATE OF DISCHARGE: 06/21/16  PRIMARY CARE PHYSICIAN: Perrin Maltese, MD    ADMISSION DIAGNOSIS:  Respiratory distress [R06.00] Chest pain, unspecified chest pain type [R07.9]  DISCHARGE DIAGNOSIS:  Principal Problem:   Chest pain Active Problems:   COPD with acute exacerbation (HCC)   COPD exacerbation (HCC)   COPD (chronic obstructive pulmonary disease) ([place])   SECONDARY DIAGNOSIS:   Past Medical History:  Diagnosis Date  . Anemia   . Aortic regurgitation   . B-cell lymphoma ([place]) 2009   DX AT [facility]  . Bronchitis   . CAD (coronary artery disease)   . Carotid stenosis   . Diabetes mellitus, type 2 ([place])   . Emphysema of lung ([place])   . Essential hypertension   . History of chemotherapy   . Hyperlipidemia   . IDA (iron deficiency anemia)   . Leg edema   . Meralgia paraesthetica   . Mitral regurgitation   . PUD (peptic ulcer disease)   . PVD (peripheral vascular disease) ([place])   . Tobacco abuse     HOSPITAL COURSE:   81 year old male patient with history of emphysema, peripheral vascular disease, diabetes mellitus, hypertension, aortic regurgitation presented to the emergency room with chest pain and cough.  #Acute COPD exacerbation -Desaturating with minimal exertion Incentive spirometry Ambulated patient with oxygen ,He needs 3 L of oxygen via nasal cannula. Arranged Home O2 Tapered IV steroids to by mouth prednisone and continue breathing treatments  #Chest pain-probably from COPD exacerbation Nontrending troponins. Dr. Doren Custard is recommending outpatient Lexiscan by patient's primary cardiologist Dr. [name]  # paroxysmal atrial fibrillation Appreciate cardio recommendations Resume metoprolol succinate 50 mg twice a day Added Cardizem to  the regimen Continue dual antiplatelet therapy. Hesitant to start chronic anticoagulation in the setting of iron deficiency anemia. This can be further addressed as an outpatient.   #Type 2 diabetes mellitus- hyperglycemia -  Check Hb A1c op Lantus 7 units q 12 hours insulin training provided to the patient prior to the discharge. Discontinued Amaryl  # Hypertension-stable Cardizem is added to the regimen. Continue metoprolol and hydralazine dose is changed to 50 minute grams by mouth every 8 hours   #peripheral vascular disease  # B-cell lymphoma-outpatient follow-up with oncology as recommended  DVT prophylaxis with Lovenox subcutaneous  DISCHARGE CONDITIONS:   fair  CONSULTS OBTAINED:  Treatment Team:  Dionisio David, MD Isaias Cowman, MD   PROCEDURES none  DRUG ALLERGIES:  No Known Allergies  DISCHARGE MEDICATIONS:   Current Discharge Medication List    START taking these medications   Details  diltiazem (CARDIZEM CD) 180 MG 24 hr capsule Take 1 capsule (180 mg total) by mouth daily. Qty: 30 capsule, Refills: 0    Fluticasone-Salmeterol (ADVAIR DISKUS) 250-50 MCG/DOSE AEPB Inhale 1 puff into the lungs 2 times daily at 12 noon and 4 pm. Qty: 1 each, Refills: 0    insulin glargine (LANTUS) 100 UNIT/ML injection Inject 0.07 mLs (7 Units total) into the skin 2 (two) times daily. Qty: 10 mL, Refills: 11    predniSONE (STERAPRED UNI-PAK 21 TAB) 10 MG (21) TBPK tablet Take 1 tablet (10 mg total) by mouth daily. Take 6 tablets by mouth for 1 day followed by  5 tablets by mouth for 1 day followed by  4 tablets by mouth for 1 day followed by  3 tablets by mouth for 1 day followed by  2 tablets by mouth for 1 day followed by  1 tablet by mouth for a day and stop Qty: 21 tablet, Refills: 0    senna-docusate (SENOKOT-S) 8.6-50 MG tablet Take 1 tablet by mouth 2 (two) times daily.      CONTINUE these medications which have CHANGED   Details  hydrALAZINE  (APRESOLINE) 25 MG tablet Take 2 tablets (50 mg total) by mouth 3 (three) times daily. Qty: 90 tablet, Refills: 0      CONTINUE these medications which have NOT CHANGED   Details  albuterol (VENTOLIN HFA) 108 (90 BASE) MCG/ACT inhaler inhale 1 puff into lungs everyday    amLODipine (NORVASC) 5 MG tablet Take 5 mg by mouth daily.     aspirin (ADULT ASPIRIN EC LOW STRENGTH) 81 MG EC tablet Take 1 tablet by mouth daily.    atorvastatin (LIPITOR) 20 MG tablet Take 20 mg by mouth daily.    clopidogrel (PLAVIX) 75 MG tablet Take 75 mg by mouth daily.     furosemide (LASIX) 40 MG tablet Take 1 tablet by mouth daily.    gabapentin (NEURONTIN) 100 MG capsule Take 1 capsule by mouth at bedtime.     hydrochlorothiazide (HYDRODIURIL) 25 MG tablet Take 25 mg by mouth daily. Refills: 3    losartan-hydrochlorothiazide (HYZAAR) 100-25 MG tablet Take 1 tablet by mouth daily.     metoprolol (TOPROL-XL) 200 MG 24 hr tablet Take 1 tablet by mouth daily.    omeprazole (PRILOSEC) 40 MG capsule Take 1 capsule by mouth every morning. Refills: 1    simvastatin (ZOCOR) 40 MG tablet Take 40 mg by mouth daily at 6 PM.       STOP taking these medications     glimepiride (AMARYL) 1 MG tablet          DISCHARGE INSTRUCTIONS:   Activity as tolerated with 3 L of oxygen via nasal cannula, as per PT recommendations Diet diabetic Follow-up with primary care physician in a week Follow-up with cardiology Dr. Lorinda Creed in a week Follow-up with pulmonology Dr. Mortimer Fries 2 weeks Follow-up with primary oncologist in 1 week or as recommended   DIET:  Diabetic diet  DISCHARGE CONDITION:  Fair  ACTIVITY:  Activity as tolerated  OXYGEN:  Home Oxygen: Yes.     Oxygen Delivery: 3 liters/min via Patient connected to nasal cannula oxygen  DISCHARGE LOCATION:  home   If you experience worsening of your admission symptoms, develop shortness of breath, life threatening emergency, suicidal or homicidal  thoughts you must seek medical attention immediately by calling 911 or calling your MD immediately  if symptoms less severe.  You Must read complete instructions/literature along with all the possible adverse reactions/side effects for all the Medicines you take and that have been prescribed to you. Take any new Medicines after you have completely understood and accpet all the possible adverse reactions/side effects.   Please note  You were cared for by a hospitalist during your hospital stay. If you have any questions about your discharge medications or the care you received while you were in the hospital after you are discharged, you can call the unit and asked to speak with the hospitalist on call if the hospitalist that took care of you is not available. Once you are discharged, your primary care physician will handle any further medical issues. Please note that NO REFILLS for any discharge  medications will be authorized once you are discharged, as it is imperative that you return to your primary care physician (or establish a relationship with a primary care physician if you do not have one) for your aftercare needs so that they can reassess your need for medications and monitor your lab values.     Today  Chief Complaint  Patient presents with  . Chest Pain  . Shortness of Breath   Patient is feeling much better. Ambulated  in the hallway but needs 3 L of oxygen via nasal cannula  ROS:  CONSTITUTIONAL: Denies fevers, chills. Denies any fatigue, weakness.  EYES: Denies blurry vision, double vision, eye pain. EARS, NOSE, THROAT: Denies tinnitus, ear pain, hearing loss. RESPIRATORY: Denies cough, wheeze, shortness of breath.  CARDIOVASCULAR: Denies chest pain, palpitations, edema.  GASTROINTESTINAL: Denies nausea, vomiting, diarrhea, abdominal pain. Denies bright red blood per rectum. GENITOURINARY: Denies dysuria, hematuria. ENDOCRINE: Denies nocturia or thyroid problems. HEMATOLOGIC  AND LYMPHATIC: Denies easy bruising or bleeding. SKIN: Denies rash or lesion. MUSCULOSKELETAL: Denies pain in neck, back, shoulder, knees, hips or arthritic symptoms.  NEUROLOGIC: Denies paralysis, paresthesias.  PSYCHIATRIC: Denies anxiety or depressive symptoms.   VITAL SIGNS:  Blood pressure (!) 144/58, pulse 80, temperature 97.9 F (36.6 C), temperature source Oral, resp. rate 18, height 5\' 10"  (1.778 m), weight 90.3 kg (199 lb), SpO2 92 %.  I/O:    Intake/Output Summary (Last 24 hours) at 06/21/16 1322 Last data filed at 06/21/16 0958  Gross per 24 hour  Intake           1860.5 ml  Output             2350 ml  Net           -489.5 ml    PHYSICAL EXAMINATION:  GENERAL:  81 y.o.-year-old patient lying in the bed with no acute distress.  EYES: Pupils equal, round, reactive to light and accommodation. No scleral icterus. Extraocular muscles intact.  HEENT: Head atraumatic, normocephalic. Oropharynx and nasopharynx clear.  NECK:  Supple, no jugular venous distention. No thyroid enlargement, no tenderness.  LUNGS: Normal breath sounds bilaterally, no wheezing, rales,rhonchi or crepitation. No use of accessory muscles of respiration.  CARDIOVASCULAR: S1, S2 normal. No murmurs, rubs, or gallops.  ABDOMEN: Soft, non-tender, non-distended. Bowel sounds present. No organomegaly or mass.  EXTREMITIES: No pedal edema, cyanosis, or clubbing.  NEUROLOGIC: Cranial nerves II through XII are intact. Muscle strength 5/5 in all extremities. Sensation intact. Gait not checked.  PSYCHIATRIC: The patient is alert and oriented x 3.  SKIN: No obvious rash, lesion, or ulcer.   DATA REVIEW:   CBC  Recent Labs Lab 06/18/16 0413  WBC 14.1*  HGB 9.2*  HCT 28.5*  PLT 308    Chemistries   Recent Labs Lab 06/19/16 1239  NA 137  K 4.0  CL 102  CO2 24  GLUCOSE 492*  BUN 68*  CREATININE 2.22*  CALCIUM 8.2*    Cardiac Enzymes  Recent Labs Lab 06/18/16 1538  TROPONINI <0.03     Microbiology Results  No results found for this or any previous visit.  RADIOLOGY:  Dg Chest 2 View  Result Date: 06/17/2016 CLINICAL DATA:  Acute onset chest pain and shortness of breath today. Tachypnea. EXAM: CHEST  2 VIEW COMPARISON:  None. FINDINGS: Mild cardiomegaly noted. Mild bibasilar scarring or atelectasis noted. No evidence of pulmonary consolidation or edema. No evidence of pleural effusion. IMPRESSION: Mild cardiomegaly and bibasilar scarring versus atelectasis. No evidence  of pulmonary consolidation or edema. Electronically Signed   By: Earle Gell M.D.   On: 06/17/2016 19:00    EKG:   Orders placed or performed during the hospital encounter of 06/17/16  . EKG 12-Lead  . EKG 12-Lead  . ED EKG  . ED EKG      Management plans discussed with the patient, family and they are in agreement.  CODE STATUS:     Code Status Orders        Start     Ordered   06/18/16 0340  Full code  Continuous     06/18/16 0339    Code Status History    Date Active Date Inactive Code Status Order ID Comments User Context   This patient has a current code status but no historical code status.      TOTAL TIME TAKING CARE OF THIS PATIENT: 45  minutes.   Note: This dictation was prepared with Dragon dictation along with smaller phrase technology. Any transcriptional errors that result from this process are unintentional.   @[facility] @  on 06/21/2016 at 1:22 PM  Between 7am to 6pm - Pager - [phone]  After 6pm go to [email or web address] - password [email or web address] [facility]  Office  [phone]  CC: Primary care physician; Perrin Maltese, MD

## 2016-06-21 NOTE — Progress Notes (Signed)
Pt instructed on how to inject insulin. Pt return demonstrated method of pulling insulin into a syringe. Pt verbalized understanding and demonstrated the correct method to draw insulin into a syringe. Discussed with the patient areas to inject and told patient to rotate areas. Pt verbalized understanding.

## 2016-07-05 ENCOUNTER — Encounter: Payer: Self-pay | Admitting: Internal Medicine

## 2016-07-05 ENCOUNTER — Ambulatory Visit (INDEPENDENT_AMBULATORY_CARE_PROVIDER_SITE_OTHER): Payer: Medicare PPO | Admitting: Internal Medicine

## 2016-07-05 VITALS — BP 152/78 | HR 93 | Ht 71.0 in | Wt 190.0 lb

## 2016-07-05 DIAGNOSIS — J438 Other emphysema: Secondary | ICD-10-CM | POA: Diagnosis not present

## 2016-07-05 MED ORDER — TIOTROPIUM BROMIDE MONOHYDRATE 18 MCG IN CAPS: 18.0000 ug | ORAL_CAPSULE | Freq: Every day | RESPIRATORY_TRACT | 3 refills | Status: DC

## 2016-07-05 NOTE — Progress Notes (Signed)
[facility] Consultation      Assessment and Plan:  Severe pulmonary hypertension, with evidence of chronic right heart failure. -Suspect that this may be secondary to the patient's aortic stenosis, and may also be secondary to other causes of hypoxemia such as emphysema. -I've advised the patient and his caregiver that they need to follow up with cardiology to further address his valvular issues, and see whether the patient may require a TAVR procedure.  Emphysema. -Patient was advised to continue Advair, and discontinue Symbicort. He is prescribed Spiriva as well. -We'll check an alpha-1 antitrypsin level today, the patient was advised to try to increase his activity level. -At subsequent visit, we can consider referral to pulmonary rehabilitation.  Chronic kidney disease. -May be contributing to the patient's volume overload status, which in turn is contributing to his dyspnea.  Acute hypoxic respiratory failure. -Multifactorial, secondary to above.  Date: 07/05/2016  MRN# [id number] Brett [name] 13-Jan-1935  Referring Physician: Dr. [name].   Brett [name] is a 81 y.o. old male seen in consultation for chief complaint of:    Chief Complaint  Patient presents with  . Hospitalization Follow-up    pt states breathing is baseline since hosp. c/o cont sob w/exertion, fatigued & weakness. on 3L 02    HPI:   81 year old male smoker with history of emphysema, peripheral vascular disease, diabetes mellitus, peripheral vascular disease, optic ulcer disease, hypertension, aortic regurgitation B-cell lymphoma. He was recently admitted to the hospital with chest pain, he was diagnosed with a COPD exacerbation. He was discharged with a prednisone taper. He is present with his friend/caregiver who gives much of the history as the pt is very hard of hearing. He notes that the breathing was better when he got out of the hospital about 2 weeks ago, and now is a bit worse  for the past 2 days.   He is currently on Advair 250 twice daily and symbicort 1 puff once per day,  and on oxygen at 3L, he was not on anything before the hospitalization. He used to smoke .5 ppd until October of last year.  He does not drive, he can walk around a [employer] slowly without difficulty.   Review of 06/17/16 CXR images; emphysematous changes.  Differential CBC from 05/07/10 showed eos of 4.4% Echo from 06/19/16; EF 60%; PASP of 61%. Mild to moderate aortic stenosis. Mild LA dilatation.    Ambulatory oxygen testing: Baseline oxygen at rest on RA was 92% and HR 112. After walking 300 feet he had significant dyspnea, sat was 86% and HR 130 and regular. Started back on 3L oxygen, sat increased to 94%.    PMHX:   Past Medical History:  Diagnosis Date  . Anemia   . Aortic regurgitation   . B-cell lymphoma ([place]) 2009   DX AT [facility]  . Bronchitis   . CAD (coronary artery disease)   . Carotid stenosis   . Diabetes mellitus, type 2 ([place])   . Emphysema of lung ([place])   . Essential hypertension   . History of chemotherapy   . Hyperlipidemia   . IDA (iron deficiency anemia)   . Leg edema   . Meralgia paraesthetica   . Mitral regurgitation   . PUD (peptic ulcer disease)   . PVD (peripheral vascular disease) ([place])   . Tobacco abuse    Surgical Hx:  Past Surgical History:  Procedure Laterality Date  . CARDIAC CATHETERIZATION  08/15/2007  . COLONOSCOPY  03/2013  .  ESOPHAGOGASTRODUODENOSCOPY  03/2013   Family Hx:  Family History  Problem Relation Age of Onset  . Rheum arthritis Neg Hx   . Osteoarthritis Neg Hx   . Asthma Neg Hx   . Diabetes Neg Hx   . Cancer Neg Hx    Social Hx:   Social History  Substance Use Topics  . Smoking status: Former Smoker    Types: Cigarettes    Quit date: 08/30/2015  . Smokeless tobacco: Never Used  . Alcohol use No   Medication:   Reviewed.    Allergies:  Review of patient's allergies indicates no known allergies.  Review of  Systems: Gen:  Denies  fever, sweats, chills HEENT: Denies blurred vision, double vision. bleeds, Cvc:  No dizziness, chest pain. Resp:   Denies cough or sputum production,  Gi: Denies swallowing difficulty, stomach pain. Gu:  Denies bladder incontinence, burning urine Ext:   No Joint pain, stiffness. Skin: No skin rash,  hives  Endoc:  No polyuria, polydipsia. Psych: No depression, insomnia. Other:  All other systems were reviewed with the patient and were negative other that what is mentioned in the HPI.   Physical Examination:   VS: BP (!) 152/78 (BP Location: Left Arm, Cuff Size: Normal)   Pulse 93   Ht 5\' 11"  (1.803 m)   Wt 190 lb (86.2 kg)   SpO2 95%   BMI 26.50 kg/m   General Appearance: Very hard of hearing, appears fatigued. Neuro:without focal findings,  speech normal,  HEENT: PERRLA, EOM intact.   Pulmonary: normal breath sounds, No wheezing. Decreased air entry bilaterally. CardiovascularNormal S1,S2.  No m/r/g.   Abdomen: Benign, Soft, non-tender. Renal:  No costovertebral tenderness  GU:  No performed at this time. Endoc: No evident thyromegaly, no signs of acromegaly. Skin:   warm, no rashes, no ecchymosis  Extremities: normal, no cyanosis, clubbing.  Other findings:    LABORATORY PANEL:   CBC No results for input(s): WBC, HGB, HCT, PLT in the last 168 hours. ------------------------------------------------------------------------------------------------------------------  Chemistries  No results for input(s): NA, K, CL, CO2, GLUCOSE, BUN, CREATININE, CALCIUM, MG, AST, ALT, ALKPHOS, BILITOT in the last 168 hours.  Invalid input(s): GFRCGP ------------------------------------------------------------------------------------------------------------------  Cardiac Enzymes No results for input(s): TROPONINI in the last 168 hours. ------------------------------------------------------------  RADIOLOGY:  No results found.     Thank  you for the  consultation and for allowing [facility] to assist in the care of your patient. Our recommendations are noted above.  Please contact us if we can be of further service.   Marda Stalker, MD.  Board Certified in Internal Medicine, Pulmonary Medicine, [place], and Sleep Medicine.  [facility] Office Number: [phone]  Patricia Pesa, M.D.  Vilinda Boehringer, M.D.  Merton Border, M.D  07/05/2016

## 2016-07-05 NOTE — Patient Instructions (Addendum)
--  Stop symbicort, continue advair. Start spiriva once daily if you can afford it.  --You have severe pulmonary hypertension, which may be associated with leaky aortic valve. follow up with cardiologist in regards to aortic valve problems.  --Will check alpha-1 testing today.  --PFT before next visit.

## 2016-07-10 ENCOUNTER — Emergency Department: Payer: Medicare PPO

## 2016-07-10 ENCOUNTER — Inpatient Hospital Stay
Admission: EM | Admit: 2016-07-10 | Discharge: 2016-07-15 | DRG: 190 | Disposition: A | Discharge status: Home / Self Care | Payer: Medicare PPO | Attending: Internal Medicine | Admitting: Internal Medicine

## 2016-07-10 DIAGNOSIS — Z7902 Long term (current) use of antithrombotics/antiplatelets: Secondary | ICD-10-CM

## 2016-07-10 DIAGNOSIS — J441 Chronic obstructive pulmonary disease with (acute) exacerbation: Secondary | ICD-10-CM | POA: Diagnosis present

## 2016-07-10 DIAGNOSIS — Z87891 Personal history of nicotine dependence: Secondary | ICD-10-CM | POA: Diagnosis not present

## 2016-07-10 DIAGNOSIS — Z8711 Personal history of peptic ulcer disease: Secondary | ICD-10-CM | POA: Diagnosis not present

## 2016-07-10 DIAGNOSIS — D638 Anemia in other chronic diseases classified elsewhere: Secondary | ICD-10-CM | POA: Diagnosis present

## 2016-07-10 DIAGNOSIS — J9811 Atelectasis: Secondary | ICD-10-CM | POA: Diagnosis present

## 2016-07-10 DIAGNOSIS — Z8572 Personal history of non-Hodgkin lymphomas: Secondary | ICD-10-CM

## 2016-07-10 DIAGNOSIS — I5033 Acute on chronic diastolic (congestive) heart failure: Secondary | ICD-10-CM | POA: Diagnosis present

## 2016-07-10 DIAGNOSIS — J189 Pneumonia, unspecified organism: Secondary | ICD-10-CM | POA: Diagnosis present

## 2016-07-10 DIAGNOSIS — Z9221 Personal history of antineoplastic chemotherapy: Secondary | ICD-10-CM | POA: Diagnosis not present

## 2016-07-10 DIAGNOSIS — N179 Acute kidney failure, unspecified: Secondary | ICD-10-CM | POA: Diagnosis present

## 2016-07-10 DIAGNOSIS — N183 Chronic kidney disease, stage 3 (moderate): Secondary | ICD-10-CM | POA: Diagnosis present

## 2016-07-10 DIAGNOSIS — R140 Abdominal distension (gaseous): Secondary | ICD-10-CM | POA: Diagnosis present

## 2016-07-10 DIAGNOSIS — I739 Peripheral vascular disease, unspecified: Secondary | ICD-10-CM | POA: Diagnosis present

## 2016-07-10 DIAGNOSIS — J440 Chronic obstructive pulmonary disease with acute lower respiratory infection: Principal | ICD-10-CM | POA: Diagnosis present

## 2016-07-10 DIAGNOSIS — I509 Heart failure, unspecified: Secondary | ICD-10-CM

## 2016-07-10 DIAGNOSIS — I10 Essential (primary) hypertension: Secondary | ICD-10-CM | POA: Diagnosis present

## 2016-07-10 DIAGNOSIS — Z9889 Other specified postprocedural states: Secondary | ICD-10-CM | POA: Diagnosis not present

## 2016-07-10 DIAGNOSIS — I2510 Atherosclerotic heart disease of native coronary artery without angina pectoris: Secondary | ICD-10-CM | POA: Diagnosis present

## 2016-07-10 DIAGNOSIS — E119 Type 2 diabetes mellitus without complications: Secondary | ICD-10-CM

## 2016-07-10 DIAGNOSIS — I130 Hypertensive heart and chronic kidney disease with heart failure and stage 1 through stage 4 chronic kidney disease, or unspecified chronic kidney disease: Secondary | ICD-10-CM | POA: Diagnosis present

## 2016-07-10 DIAGNOSIS — T502X5A Adverse effect of carbonic-anhydrase inhibitors, benzothiadiazides and other diuretics, initial encounter: Secondary | ICD-10-CM | POA: Diagnosis present

## 2016-07-10 DIAGNOSIS — Z7951 Long term (current) use of inhaled steroids: Secondary | ICD-10-CM | POA: Diagnosis not present

## 2016-07-10 DIAGNOSIS — R079 Chest pain, unspecified: Secondary | ICD-10-CM

## 2016-07-10 DIAGNOSIS — J449 Chronic obstructive pulmonary disease, unspecified: Secondary | ICD-10-CM | POA: Diagnosis present

## 2016-07-10 DIAGNOSIS — I350 Nonrheumatic aortic (valve) stenosis: Secondary | ICD-10-CM | POA: Diagnosis present

## 2016-07-10 DIAGNOSIS — I272 Other secondary pulmonary hypertension: Secondary | ICD-10-CM | POA: Diagnosis present

## 2016-07-10 DIAGNOSIS — J9621 Acute and chronic respiratory failure with hypoxia: Secondary | ICD-10-CM | POA: Diagnosis present

## 2016-07-10 DIAGNOSIS — Z79899 Other long term (current) drug therapy: Secondary | ICD-10-CM

## 2016-07-10 DIAGNOSIS — E1122 Type 2 diabetes mellitus with diabetic chronic kidney disease: Secondary | ICD-10-CM | POA: Diagnosis present

## 2016-07-10 DIAGNOSIS — Y95 Nosocomial condition: Secondary | ICD-10-CM | POA: Diagnosis present

## 2016-07-10 DIAGNOSIS — Z7982 Long term (current) use of aspirin: Secondary | ICD-10-CM

## 2016-07-10 DIAGNOSIS — J90 Pleural effusion, not elsewhere classified: Secondary | ICD-10-CM

## 2016-07-10 DIAGNOSIS — R0602 Shortness of breath: Secondary | ICD-10-CM

## 2016-07-10 DIAGNOSIS — A419 Sepsis, unspecified organism: Secondary | ICD-10-CM | POA: Diagnosis present

## 2016-07-10 DIAGNOSIS — E876 Hypokalemia: Secondary | ICD-10-CM | POA: Diagnosis present

## 2016-07-10 LAB — COMPREHENSIVE METABOLIC PANEL
ALBUMIN: 3.2 g/dL — AB (ref 3.5–5.0)
ALK PHOS: 107 U/L (ref 38–126)
ALT: 24 U/L (ref 17–63)
AST: 18 U/L (ref 15–41)
Anion gap: 7 (ref 5–15)
BILIRUBIN TOTAL: 0.3 mg/dL (ref 0.3–1.2)
BUN: 31 mg/dL — AB (ref 6–20)
CHLORIDE: 100 mmol/L — AB (ref 101–111)
CO2: 33 mmol/L — AB (ref 22–32)
CREATININE: 1.66 mg/dL — AB (ref 0.61–1.24)
Calcium: 7.7 mg/dL — ABNORMAL LOW (ref 8.9–10.3)
GFR calc Af Amer: 44 mL/min — ABNORMAL LOW (ref >60)
GFR calc non Af Amer: 38 mL/min — ABNORMAL LOW (ref >60)
GLUCOSE: 201 mg/dL — AB (ref 65–99)
Potassium: 3.2 mmol/L — ABNORMAL LOW (ref 3.5–5.1)
Sodium: 140 mmol/L (ref 135–145)
Total Protein: 7 g/dL (ref 6.5–8.1)

## 2016-07-10 LAB — CBC WITH DIFFERENTIAL/PLATELET
BASOS ABS: 0.1 10*3/uL (ref 0–0.1)
BASOS PCT: 1 %
Eosinophils Absolute: 0.3 10*3/uL (ref 0–0.7)
Eosinophils Relative: 3 %
HEMATOCRIT: 28.5 % — AB (ref 40.0–52.0)
HEMOGLOBIN: 9.3 g/dL — AB (ref 13.0–18.0)
Lymphocytes Relative: 7 %
Lymphs Abs: 0.6 10*3/uL — ABNORMAL LOW (ref 1.0–3.6)
MCH: 24.6 pg — ABNORMAL LOW (ref 26.0–34.0)
MCHC: 32.6 g/dL (ref 32.0–36.0)
MCV: 75.6 fL — ABNORMAL LOW (ref 80.0–100.0)
Monocytes Absolute: 0.7 10*3/uL (ref 0.2–1.0)
Monocytes Relative: 9 %
NEUTROS ABS: 6.4 10*3/uL (ref 1.4–6.5)
NEUTROS PCT: 80 %
Platelets: 256 10*3/uL (ref 150–440)
RBC: 3.78 MIL/uL — ABNORMAL LOW (ref 4.40–5.90)
RDW: 18.6 % — AB (ref 11.5–14.5)
WBC: 8 10*3/uL (ref 3.8–10.6)

## 2016-07-10 LAB — BRAIN NATRIURETIC PEPTIDE: B Natriuretic Peptide: 427 pg/mL — ABNORMAL HIGH (ref 0.0–100.0)

## 2016-07-10 LAB — TROPONIN I: Troponin I: <0.03 ng/mL (ref <0.03)

## 2016-07-10 MED ORDER — PIPERACILLIN-TAZOBACTAM 3.375 G IVPB
INTRAVENOUS | Status: AC
  Filled 2016-07-10: qty 50

## 2016-07-10 MED ORDER — LEVOFLOXACIN IN D5W 750 MG/150ML IV SOLN
750.0000 mg | Freq: Once | INTRAVENOUS | Status: AC
  Administered 2016-07-10: 750 mg via INTRAVENOUS
  Filled 2016-07-10: qty 150

## 2016-07-10 MED ORDER — PIPERACILLIN-TAZOBACTAM 3.375 G IVPB 30 MIN
3.3750 g | Freq: Once | INTRAVENOUS | Status: AC
  Administered 2016-07-10: 3.375 g via INTRAVENOUS
  Filled 2016-07-10: qty 50

## 2016-07-10 MED ORDER — IPRATROPIUM-ALBUTEROL 0.5-2.5 (3) MG/3ML IN SOLN
RESPIRATORY_TRACT | Status: AC
  Filled 2016-07-10: qty 3

## 2016-07-10 MED ORDER — VANCOMYCIN HCL IN DEXTROSE 1-5 GM/200ML-% IV SOLN
1000.0000 mg | Freq: Once | INTRAVENOUS | Status: AC
  Administered 2016-07-10: 1000 mg via INTRAVENOUS
  Filled 2016-07-10: qty 200

## 2016-07-10 MED ORDER — IPRATROPIUM-ALBUTEROL 0.5-2.5 (3) MG/3ML IN SOLN
3.0000 mL | Freq: Once | RESPIRATORY_TRACT | Status: AC
  Administered 2016-07-10: 3 mL via RESPIRATORY_TRACT

## 2016-07-10 MED ORDER — FUROSEMIDE 10 MG/ML IJ SOLN
40.0000 mg | Freq: Once | INTRAMUSCULAR | Status: AC
  Administered 2016-07-10: 40 mg via INTRAVENOUS
  Filled 2016-07-10: qty 4

## 2016-07-10 NOTE — ED Notes (Signed)
Admitting MD at bedside.

## 2016-07-10 NOTE — ED Notes (Addendum)
Pt took 324 mg of aspirin before coming to ED. Pt supposed to see cardiology on Thursday for 'leaky valve

## 2016-07-10 NOTE — ED Notes (Signed)
Pt's O2 sat decreased to 93%. Pt's O2 flow increased to 5L from 4L [place]. Pt's O2 sat currently 95%; will continue to monitor

## 2016-07-10 NOTE — H&P (Signed)
Brett [name] at [place] NAME: Brett [name]    MR#:  [id number]  DATE OF BIRTH:  [birth year]  DATE OF ADMISSION:  07/10/2016  PRIMARY CARE PHYSICIAN: Perrin Maltese, MD   REQUESTING/REFERRING PHYSICIAN: Edd Fabian, MD  CHIEF COMPLAINT:   Chief Complaint  Patient presents with  . Respiratory Distress  . Abdominal Pain    upper left quadrant    HISTORY OF PRESENT ILLNESS:  Brett [name]  is a 81 y.o. male who presents with 3 days of progressive shortness of breath as well as some left-sided chest pain. Patient states he has had some chills at home as well. Workup in the ED today shows possible pneumonia, bandemia on his differential, elevated BNP suggestive of heart failure. Patient has significant lower extremity edema as well as some abdominal distention. Hospitals were called for admission  PAST MEDICAL HISTORY:   Past Medical History:  Diagnosis Date  . Anemia   . Aortic regurgitation   . B-cell lymphoma ([place]) 2009   DX AT [facility]  . Bronchitis   . CAD (coronary artery disease)   . Carotid stenosis   . Diabetes mellitus, type 2 ([place])   . Emphysema of lung ([place])   . Essential hypertension   . History of chemotherapy   . Hyperlipidemia   . IDA (iron deficiency anemia)   . Leg edema   . Meralgia paraesthetica   . Mitral regurgitation   . PUD (peptic ulcer disease)   . PVD (peripheral vascular disease) ([place])   . Tobacco abuse     PAST SURGICAL HISTORY:   Past Surgical History:  Procedure Laterality Date  . CARDIAC CATHETERIZATION  08/15/2007  . COLONOSCOPY  03/2013  . ESOPHAGOGASTRODUODENOSCOPY  03/2013    SOCIAL HISTORY:   Social History  Substance Use Topics  . Smoking status: Former Smoker    Types: Cigarettes    Quit date: 08/30/2015  . Smokeless tobacco: Never Used  . Alcohol use No    FAMILY HISTORY:   Family History  Problem Relation Age of Onset  . Rheum arthritis Neg Hx   . Osteoarthritis Neg Hx   .  Asthma Neg Hx   . Diabetes Neg Hx   . Cancer Neg Hx     DRUG ALLERGIES:  No Known Allergies  MEDICATIONS AT HOME:   Prior to Admission medications   Medication Sig Start Date End Date Taking? Authorizing Provider  amLODipine (NORVASC) 5 MG tablet Take 5 mg by mouth daily.  08/27/15  Yes Historical Provider, MD  aspirin (ADULT ASPIRIN EC LOW STRENGTH) 81 MG EC tablet Take 1 tablet by mouth daily. 09/19/15  Yes Historical Provider, MD  atorvastatin (LIPITOR) 20 MG tablet Take 20 mg by mouth daily.   Yes Historical Provider, MD  clopidogrel (PLAVIX) 75 MG tablet Take 75 mg by mouth daily.  08/27/15  Yes Historical Provider, MD  diltiazem (CARDIZEM CD) 180 MG 24 hr capsule Take 1 capsule (180 mg total) by mouth daily. 06/21/16  Yes [name], MD  Fluticasone-Salmeterol (ADVAIR DISKUS) 250-50 MCG/DOSE AEPB Inhale 1 puff into the lungs 2 times daily at 12 noon and 4 pm. 06/21/16  Yes [name], MD  furosemide (LASIX) 40 MG tablet Take 1 tablet by mouth daily. 09/22/15  Yes Historical Provider, MD  gabapentin (NEURONTIN) 100 MG capsule Take 1 capsule by mouth at bedtime.  09/22/15  Yes Historical Provider, MD  hydrALAZINE (APRESOLINE) 25 MG tablet Take 2 tablets (50 mg total)  by mouth 3 (three) times daily. 06/21/16  Yes [name], MD  hydrochlorothiazide (HYDRODIURIL) 25 MG tablet Take 25 mg by mouth daily. 09/16/15  Yes Historical Provider, MD  metoprolol (TOPROL-XL) 200 MG 24 hr tablet Take 1 tablet by mouth daily. 09/22/15  Yes Historical Provider, MD  senna-docusate (SENOKOT-S) 8.6-50 MG tablet Take 1 tablet by mouth 2 (two) times daily. 06/21/16  Yes [name], MD  tiotropium (SPIRIVA HANDIHALER) 18 MCG inhalation capsule Place 1 capsule (18 mcg total) into inhaler and inhale daily. 07/05/16  Yes Laverle Hobby, MD    REVIEW OF SYSTEMS:  Review of Systems  Constitutional: Positive for chills and fever. Negative for malaise/fatigue and weight loss.  HENT: Negative for ear pain,  hearing loss and tinnitus.   Eyes: Negative for blurred vision, double vision, pain and redness.  Respiratory: Positive for shortness of breath. Negative for cough and hemoptysis.   Cardiovascular: Positive for chest pain and leg swelling. Negative for palpitations and orthopnea.  Gastrointestinal: Negative for abdominal pain, constipation, diarrhea, nausea and vomiting.  Genitourinary: Negative for dysuria, frequency and hematuria.  Musculoskeletal: Negative for back pain, joint pain and neck pain.  Skin:       No acne, rash, or lesions  Neurological: Negative for dizziness, tremors, focal weakness and weakness.  Endo/Heme/Allergies: Negative for polydipsia. Does not bruise/bleed easily.  Psychiatric/Behavioral: Negative for depression. The patient is not nervous/anxious and does not have insomnia.      VITAL SIGNS:   Vitals:   07/10/16 2049 07/10/16 2051 07/10/16 2100 07/10/16 2154  BP: (!) 156/65  (!) 142/42 (!) 143/53  Pulse: 83  80 85  Resp: (!) 32  (!) 38 (!) 34  Temp: 99.6 F (37.6 C)   98.6 F (37 C)  TempSrc: Oral   Axillary  SpO2: 93%  93% 93%  Weight: 86.2 kg (190 lb) 86.2 kg (190 lb)    Height:  5\' 10"  (1.778 m)     Wt Readings from Last 3 Encounters:  07/10/16 86.2 kg (190 lb)  07/05/16 86.2 kg (190 lb)  06/19/16 90.3 kg (199 lb)    PHYSICAL EXAMINATION:  Physical Exam  Vitals reviewed. Constitutional: He is oriented to person, place, and time. He appears well-developed and well-nourished. No distress.  HENT:  Head: Normocephalic and atraumatic.  Mouth/Throat: Oropharynx is clear and moist.  Eyes: Conjunctivae and EOM are normal. Pupils are equal, round, and reactive to light. No scleral icterus.  Neck: Normal range of motion. Neck supple. No JVD present. No thyromegaly present.  Cardiovascular: Normal rate, regular rhythm and intact distal pulses.  Exam reveals no gallop and no friction rub.   No murmur heard. Respiratory: Effort normal. No respiratory  distress. He has no wheezes. He has rales.  GI: Soft. Bowel sounds are normal. He exhibits distension. There is no tenderness.  Musculoskeletal: Normal range of motion. He exhibits edema.  No arthritis, no gout  Lymphadenopathy:    He has no cervical adenopathy.  Neurological: He is alert and oriented to person, place, and time. No cranial nerve deficit.  No dysarthria, no aphasia  Skin: Skin is warm and dry. No rash noted. No erythema.  Psychiatric: He has a normal mood and affect. His behavior is normal. Judgment and thought content normal.    LABORATORY PANEL:   CBC  Recent Labs Lab 07/10/16 2048  WBC 8.0  HGB 9.3*  HCT 28.5*  PLT 256   ------------------------------------------------------------------------------------------------------------------  Chemistries   Recent Labs Lab 07/10/16 2048  NA 140  K 3.2*  CL 100*  CO2 33*  GLUCOSE 201*  BUN 31*  CREATININE 1.66*  CALCIUM 7.7*  AST 18  ALT 24  ALKPHOS 107  BILITOT 0.3   ------------------------------------------------------------------------------------------------------------------  Cardiac Enzymes  Recent Labs Lab 07/10/16 2048  TROPONINI <0.03   ------------------------------------------------------------------------------------------------------------------  RADIOLOGY:  Dg Chest 2 View  Result Date: 07/10/2016 CLINICAL DATA:  Chest pain. EXAM: CHEST  2 VIEW COMPARISON:  06/17/2016. FINDINGS: Interval small to moderate-sized bilateral pleural effusions, larger on the right. Bibasilar airspace opacity with an appearance most compatible with atelectasis. Poor inspiration with no gross change in mild prominence of the pulmonary vasculature and interstitial markings. No gross change in mild cardiomegaly. Diffuse osteopenia. Thoracolumbar spine degenerative changes. IMPRESSION: Interval bilateral pleural effusions and bibasilar atelectasis. Underlying pneumonia cannot be excluded. Electronically Signed    By: Claudie Revering M.D.   On: 07/10/2016 22:05    EKG:   Orders placed or performed during the hospital encounter of 07/10/16  . EKG 12-Lead  . EKG 12-Lead    IMPRESSION AND PLAN:  Principal Problem:   Sepsis ([place]) - antibiotics started in the ED, continue on admission. Blood and urine cultures sent. Patient is hemodynamically stable. Lactic acid pending. Active Problems:   Acute CHF - patient was admitted here in the hospital about a month ago and had an echocardiogram at that time which did not show any signs of CHF. However, tonight his BNP is elevated at 400 and he has clinical signs of CHF with bilateral lower extremity edema as well as abdominal distention. IV Lasix given in the ED, we'll order a second dose early in the morning as long as his lactic acid is okay.   COPD (chronic obstructive pulmonary disease) ([place]) - continue home inhalers   HCAP (healthcare-associated pneumonia) - IV antibiotics and cultures as above   HTN (hypertension) - continue home meds   Diabetes ([place]) - slight scale insulin with corresponding glucose checks and carb modified diet   CAD (coronary artery disease) - continue home meds  All the records are reviewed and case discussed with ED provider. Management plans discussed with the patient and/or family.  DVT PROPHYLAXIS: SubQ lovenox  GI PROPHYLAXIS: None  ADMISSION STATUS: Inpatient  CODE STATUS: Full Code Status History    Date Active Date Inactive Code Status Order ID Comments User Context   06/18/2016  3:40 AM 06/21/2016  6:44 PM Full Code [id number]  Saundra Shelling, MD ED      TOTAL TIME TAKING CARE OF THIS PATIENT: 45 minutes.    Jalina Blowers [place] 07/10/2016, 11:24 PM  [facility]  Office  [phone]  CC: Primary care physician; Perrin Maltese, MD

## 2016-07-10 NOTE — ED Notes (Signed)
Pt reminded to breath through nose

## 2016-07-10 NOTE — ED Triage Notes (Signed)
Per EMS: Pt called out for chest pain. Pt has recent COPD dx. Pt normally on 3L [place]. Pt O2 93% at EMS arrival. PT breathing labored, switched to 4L [place]. Pt recently finished lasted dose of prednisone - CBG currently elevated at 210

## 2016-07-10 NOTE — ED Notes (Signed)
Changed pt's dressing on right AC IV.

## 2016-07-10 NOTE — ED Notes (Signed)
Pt O2 decreased to 4L [place]. Pt O2 94%.

## 2016-07-10 NOTE — ED Notes (Signed)
MD Gayle at bedside. 

## 2016-07-10 NOTE — ED Notes (Signed)
Called pharmacy to request vancomyocin

## 2016-07-10 NOTE — ED Provider Notes (Addendum)
[facility] Emergency Department Provider Note   ____________________________________________   First MD Initiated Contact with Patient 07/10/16 2051     (approximate)  I have reviewed the triage vital signs and the nursing notes.   HISTORY  Chief Complaint Respiratory Distress and Abdominal Pain (upper left quadrant)    HPI Brett [name] is a 81 y.o. male with history of carotid stenosis, coronary artery disease, recent COPD diagnosis, severe pulmonary hypertension with chronic right heart failure thought to be secondary to known aortic stenosis who presents for evaluation of 3-4 hours of gradual onset constant left-sided chest and left upper quadrant pain, gradual, constant, severe, no modifying factors. This began at rest. He is also having shortness of breath. According to his son, he had a low-grade fever with a temperature of 100.8. No vomiting or diarrhea. He took 325 mg of aspirin at home. He has had worsening lower extremity swelling.    Past Medical History:  Diagnosis Date  . Anemia   . Aortic regurgitation   . B-cell lymphoma ([place]) 2009   DX AT [facility]  . Bronchitis   . CAD (coronary artery disease)   . Carotid stenosis   . Diabetes mellitus, type 2 ([place])   . Emphysema of lung ([place])   . Essential hypertension   . History of chemotherapy   . Hyperlipidemia   . IDA (iron deficiency anemia)   . Leg edema   . Meralgia paraesthetica   . Mitral regurgitation   . PUD (peptic ulcer disease)   . PVD (peripheral vascular disease) ([place])   . Tobacco abuse     Patient Active Problem List   Diagnosis Date Noted  . COPD (chronic obstructive pulmonary disease) ([place]) 06/20/2016  . Chest pain 06/18/2016  . COPD with acute exacerbation ([place]) 06/18/2016  . COPD exacerbation ([place]) 06/18/2016  . B-cell lymphoma ([place]) 10/14/2015    Past Surgical History:  Procedure Laterality Date  . CARDIAC CATHETERIZATION  08/15/2007  . COLONOSCOPY  03/2013    . ESOPHAGOGASTRODUODENOSCOPY  03/2013    Prior to Admission medications   Medication Sig Start Date End Date Taking? Authorizing Provider  amLODipine (NORVASC) 5 MG tablet Take 5 mg by mouth daily.  08/27/15  Yes Historical Provider, MD  aspirin (ADULT ASPIRIN EC LOW STRENGTH) 81 MG EC tablet Take 1 tablet by mouth daily. 09/19/15  Yes Historical Provider, MD  atorvastatin (LIPITOR) 20 MG tablet Take 20 mg by mouth daily.   Yes Historical Provider, MD  clopidogrel (PLAVIX) 75 MG tablet Take 75 mg by mouth daily.  08/27/15  Yes Historical Provider, MD  diltiazem (CARDIZEM CD) 180 MG 24 hr capsule Take 1 capsule (180 mg total) by mouth daily. 06/21/16  Yes [name], MD  Fluticasone-Salmeterol (ADVAIR DISKUS) 250-50 MCG/DOSE AEPB Inhale 1 puff into the lungs 2 times daily at 12 noon and 4 pm. 06/21/16  Yes [name], MD  furosemide (LASIX) 40 MG tablet Take 1 tablet by mouth daily. 09/22/15  Yes Historical Provider, MD  gabapentin (NEURONTIN) 100 MG capsule Take 1 capsule by mouth at bedtime.  09/22/15  Yes Historical Provider, MD  hydrALAZINE (APRESOLINE) 25 MG tablet Take 2 tablets (50 mg total) by mouth 3 (three) times daily. 06/21/16  Yes [name], MD  hydrochlorothiazide (HYDRODIURIL) 25 MG tablet Take 25 mg by mouth daily. 09/16/15  Yes Historical Provider, MD  metoprolol (TOPROL-XL) 200 MG 24 hr tablet Take 1 tablet by mouth daily. 09/22/15  Yes Historical Provider, MD  senna-docusate (SENOKOT-S) 8.6-50 MG tablet Take 1 tablet by mouth 2 (two) times daily. 06/21/16  Yes [name], MD  tiotropium (SPIRIVA HANDIHALER) 18 MCG inhalation capsule Place 1 capsule (18 mcg total) into inhaler and inhale daily. 07/05/16  Yes Laverle Hobby, MD    Allergies Review of patient's allergies indicates no known allergies.  Family History  Problem Relation Age of Onset  . Rheum arthritis Neg Hx   . Osteoarthritis Neg Hx   . Asthma Neg Hx   . Diabetes Neg Hx   . Cancer Neg Hx     Social  History Social History  Substance Use Topics  . Smoking status: Former Smoker    Types: Cigarettes    Quit date: 08/30/2015  . Smokeless tobacco: Never Used  . Alcohol use No    Review of Systems Constitutional: No fever/chills Eyes: No visual changes. ENT: No sore throat. Cardiovascular: +chest pain. Respiratory: + shortness of breath. Gastrointestinal: +LUQ abdominal pain.  No nausea, no vomiting.  No diarrhea.  No constipation. Genitourinary: Negative for dysuria. Musculoskeletal: Negative for back pain. Skin: Negative for rash. Neurological: Negative for headaches, focal weakness or numbness.  10-point ROS otherwise negative.  ____________________________________________   PHYSICAL EXAM:  Vitals:   07/10/16 2049 07/10/16 2051 07/10/16 2100 07/10/16 2154  BP: (!) 156/65  (!) 142/42 (!) 143/53  Pulse: 83  80 85  Resp: (!) 32  (!) 38 (!) 34  Temp: 99.6 F (37.6 C)   98.6 F (37 C)  TempSrc: Oral   Axillary  SpO2: 93%  93% 93%  Weight: 190 lb (86.2 kg) 190 lb (86.2 kg)    Height:  5\' 10"  (1.778 m)      VITAL SIGNS: ED Triage Vitals [07/10/16 2044]  Enc Vitals Group     BP      Pulse      Resp      Temp      Temp src      SpO2 94 %     Weight      Height      Head Circumference      Peak Flow      Pain Score      Pain Loc      Pain Edu?      Excl. in [place]?     Constitutional: Alert and oriented. In mild respiratory distress but able to speak in short sentences. Eyes: Conjunctivae are normal. PERRL. EOMI. Head: Atraumatic. Nose: No congestion/rhinnorhea. Mouth/Throat: Mucous membranes are moist.  Oropharynx non-erythematous. Neck: No stridor.  Supple without meningismus. Cardiovascular: Normal rate, regular rhythm. Grossly normal heart sounds.  Good peripheral circulation. Respiratory: Tachypnea with mild increased work of breathing, poor air movement bilaterally, no wheeze, rhonchi and the bases. Gastrointestinal: Soft and nontender. No distention.   No CVA tenderness. Genitourinary: Deferred. Musculoskeletal: 4+ pitting edema bilateral lower extremities. Neurologic:  Normal speech and language. No gross focal neurologic deficits are appreciated.  Skin:  Skin is warm, dry and intact. No rash noted. Psychiatric: Mood and affect are normal. Speech and behavior are normal.  ____________________________________________   LABS (all labs ordered are listed, but only abnormal results are displayed)  Labs Reviewed  CBC WITH DIFFERENTIAL/PLATELET - Abnormal; Notable for the following:       Result Value   RBC 3.78 (*)    Hemoglobin 9.3 (*)    HCT 28.5 (*)    MCV 75.6 (*)    MCH 24.6 (*)    RDW 18.6 (*)  Lymphs Abs 0.6 (*)    All other components within normal limits  COMPREHENSIVE METABOLIC PANEL - Abnormal; Notable for the following:    Potassium 3.2 (*)    Chloride 100 (*)    CO2 33 (*)    Glucose, Bld 201 (*)    BUN 31 (*)    Creatinine, Ser 1.66 (*)    Calcium 7.7 (*)    Albumin 3.2 (*)    GFR calc non Af Amer 38 (*)    GFR calc Af Amer 44 (*)    All other components within normal limits  BRAIN NATRIURETIC PEPTIDE - Abnormal; Notable for the following:    B Natriuretic Peptide 427.0 (*)    All other components within normal limits  CULTURE, BLOOD (ROUTINE X 2)  CULTURE, BLOOD (ROUTINE X 2)  TROPONIN I   ____________________________________________  EKG  ED ECG REPORT I, Joanne Gavel, the attending physician, personally viewed and interpreted this ECG.   Date: 07/10/2016  EKG Time: 20:48  Rate: 83  Rhythm: normal sinus rhythm  Axis: normal  Intervals:none  ST&T Change: No acute ST elevation or acute ST depression. T-wave inversions in lead 1, aVL, V2, V4, V5, V6. When compared to the prior EKG, these T-wave inversions are new since 06/17/2016.  ____________________________________________  RADIOLOGY  CXR IMPRESSION: Interval bilateral pleural effusions and bibasilar atelectasis. Underlying pneumonia  cannot be excluded. ____________________________________________   PROCEDURES  Procedure(s) performed: None  Procedures  Critical Care performed: Yes, see critical care note(s).  CRITICAL CARE Performed by: Loura Pardon A   Total critical care time: 30 minutes  Critical care time was exclusive of separately billable procedures and treating other patients.  Critical care was necessary to treat or prevent imminent or life-threatening deterioration.  Critical care was time spent personally by me on the following activities: development of treatment plan with patient and/or surrogate as well as nursing, discussions with consultants, evaluation of patient's response to treatment, examination of patient, obtaining history from patient or surrogate, ordering and performing treatments and interventions, ordering and review of laboratory studies, ordering and review of radiographic studies, pulse oximetry and re-evaluation of patient's condition.  ____________________________________________   INITIAL IMPRESSION / ASSESSMENT AND PLAN / ED COURSE  Pertinent labs & imaging results that were available during my care of the patient were reviewed by me and considered in my medical decision making (see chart for details).  ALVESTER KNOEDLER is a 81 y.o. male with history of carotid stenosis, coronary artery disease, recent COPD diagnosis, severe pulmonary hypertension with chronic right heart failure thought to be secondary to known aortic stenosis who presents for evaluation of 3-4 hours of gradual onset constant left-sided chest and left upper quadrant pain. On exam he is tachypneic, respiratory rate 32-38 at rest, O2 saturation is 93% on 4 L of oxygen, patient typically wears 3 L of oxygen chronically. He has mild tenderness in left chest wall, globally diminished breath sounds with rhonchi in the bases. Severe pitting edema bilateral lower extremities. Concern for ACS, COPD versus CHF  exacerbation. We'll treat with DuoNeb treatments, obtain screening labs, chest x-ray, give Lasix, and reassess frequently. anticipate admission.  ----------------------------------------- 10:38 PM on 07/10/2016 ----------------------------------------- Patient appears unremarkable at this time. He reports he is not having any more pain in the left side of his chest. Still with diminished air movement. Chest x-ray shows bilateral sizable pleural effusions, underlying pneumonia cannot be excluded. BNP is elevated at 427. We'll treat for CHF exacerbation as well  as age With Lasix, IV vancomycin, Zosyn, Levaquin given his hospitalization 1 month ago. His troponin is negative though his EKG did show new T-wave inversions in multiple leads. CBC shows anemia, hemoglobin 9.3 but this is chronic. CMP shows mild creatinine elevation at 1.66 which is improved from prior. Case discussed with Dr. Jannifer Franklin, hospitalist, for admission at this time.   Clinical Course     ____________________________________________   FINAL CLINICAL IMPRESSION(S) / ED DIAGNOSES  Final diagnoses:  HCAP (healthcare-associated pneumonia)  SOB (shortness of breath)  Chest pain, unspecified chest pain type  Acute on chronic congestive heart failure, unspecified congestive heart failure type ([place])      NEW MEDICATIONS STARTED DURING THIS VISIT:  New Prescriptions   No medications on file     Note:  This document was prepared using Dragon voice recognition software and may include unintentional dictation errors.    Joanne Gavel, MD 07/10/16 [place] Maizee Reinhold, MD 07/10/16 2251

## 2016-07-10 NOTE — ED Notes (Signed)
Patient transported to X-ray 

## 2016-07-11 ENCOUNTER — Inpatient Hospital Stay: Payer: Medicare PPO

## 2016-07-11 ENCOUNTER — Inpatient Hospital Stay
Admit: 2016-07-11 | Discharge: 2016-07-11 | Disposition: A | Discharge status: Home / Self Care | Payer: Medicare PPO | Attending: Cardiology | Admitting: Cardiology

## 2016-07-11 LAB — BASIC METABOLIC PANEL
Anion gap: 8 (ref 5–15)
BUN: 25 mg/dL — AB (ref 6–20)
CALCIUM: 7.4 mg/dL — AB (ref 8.9–10.3)
CHLORIDE: 97 mmol/L — AB (ref 101–111)
CO2: 37 mmol/L — AB (ref 22–32)
CREATININE: 1.62 mg/dL — AB (ref 0.61–1.24)
GFR calc Af Amer: 45 mL/min — ABNORMAL LOW (ref >60)
GFR calc non Af Amer: 39 mL/min — ABNORMAL LOW (ref >60)
GLUCOSE: 150 mg/dL — AB (ref 65–99)
Potassium: 2.6 mmol/L — CL (ref 3.5–5.1)
Sodium: 142 mmol/L (ref 135–145)

## 2016-07-11 LAB — CBC
HCT: 27.2 % — ABNORMAL LOW (ref 40.0–52.0)
Hemoglobin: 8.9 g/dL — ABNORMAL LOW (ref 13.0–18.0)
MCH: 24.8 pg — AB (ref 26.0–34.0)
MCHC: 32.7 g/dL (ref 32.0–36.0)
MCV: 75.8 fL — AB (ref 80.0–100.0)
PLATELETS: 243 10*3/uL (ref 150–440)
RBC: 3.59 MIL/uL — AB (ref 4.40–5.90)
RDW: 19 % — ABNORMAL HIGH (ref 11.5–14.5)
WBC: 6.3 10*3/uL (ref 3.8–10.6)

## 2016-07-11 LAB — MAGNESIUM
MAGNESIUM: 2 mg/dL (ref 1.7–2.4)
Magnesium: 1.1 mg/dL — ABNORMAL LOW (ref 1.7–2.4)

## 2016-07-11 LAB — TROPONIN I
TROPONIN I: 0.03 ng/mL — AB (ref <0.03)
Troponin I: 0.03 ng/mL (ref <0.03)
Troponin I: 0.03 ng/mL (ref <0.03)

## 2016-07-11 LAB — TSH: TSH: 1.599 u[IU]/mL (ref 0.350–4.500)

## 2016-07-11 LAB — GLUCOSE, CAPILLARY
Glucose-Capillary: 161 mg/dL — ABNORMAL HIGH (ref 65–99)
Glucose-Capillary: 315 mg/dL — ABNORMAL HIGH (ref 65–99)
Glucose-Capillary: 261 mg/dL — ABNORMAL HIGH (ref 65–99)
Glucose-Capillary: 217 mg/dL — ABNORMAL HIGH (ref 65–99)

## 2016-07-11 LAB — LACTIC ACID, PLASMA: LACTIC ACID, VENOUS: 1.1 mmol/L (ref 0.5–1.9)

## 2016-07-11 LAB — MRSA PCR SCREENING: MRSA BY PCR: NEGATIVE

## 2016-07-11 LAB — POTASSIUM: Potassium: 3 mmol/L — ABNORMAL LOW (ref 3.5–5.1)

## 2016-07-11 MED ORDER — SODIUM CHLORIDE 0.9 % IV SOLN
1250.0000 mg | INTRAVENOUS | Status: DC
  Administered 2016-07-11: 1250 mg via INTRAVENOUS
  Filled 2016-07-11: qty 1250

## 2016-07-11 MED ORDER — GABAPENTIN 100 MG PO CAPS
100.0000 mg | ORAL_CAPSULE | Freq: Every day | ORAL | Status: DC
  Administered 2016-07-11 – 2016-07-14 (×5): 100 mg via ORAL
  Filled 2016-07-11 (×5): qty 1

## 2016-07-11 MED ORDER — DILTIAZEM HCL ER COATED BEADS 180 MG PO CP24
180.0000 mg | ORAL_CAPSULE | Freq: Every day | ORAL | Status: DC
  Administered 2016-07-11 – 2016-07-15 (×5): 180 mg via ORAL
  Filled 2016-07-11 (×5): qty 1

## 2016-07-11 MED ORDER — CLOPIDOGREL BISULFATE 75 MG PO TABS
75.0000 mg | ORAL_TABLET | Freq: Every day | ORAL | Status: DC
  Administered 2016-07-11 – 2016-07-15 (×5): 75 mg via ORAL
  Filled 2016-07-11 (×5): qty 1

## 2016-07-11 MED ORDER — INSULIN ASPART 100 UNIT/ML ~~LOC~~ SOLN
0.0000 [IU] | Freq: Every day | SUBCUTANEOUS | Status: DC
  Administered 2016-07-11: 2 [IU] via SUBCUTANEOUS
  Filled 2016-07-11: qty 2

## 2016-07-11 MED ORDER — IPRATROPIUM-ALBUTEROL 0.5-2.5 (3) MG/3ML IN SOLN
3.0000 mL | RESPIRATORY_TRACT | Status: DC
  Administered 2016-07-11 – 2016-07-13 (×15): 3 mL via RESPIRATORY_TRACT
  Filled 2016-07-11 (×16): qty 3

## 2016-07-11 MED ORDER — METOPROLOL SUCCINATE ER 100 MG PO TB24
200.0000 mg | ORAL_TABLET | Freq: Every day | ORAL | Status: DC
Start: 2016-07-11 — End: 2016-07-15
  Administered 2016-07-11 – 2016-07-15 (×5): 200 mg via ORAL
  Filled 2016-07-11 (×5): qty 2

## 2016-07-11 MED ORDER — ONDANSETRON HCL 4 MG PO TABS: 4.0000 mg | ORAL_TABLET | Freq: Four times a day (QID) | ORAL | Status: DC | PRN

## 2016-07-11 MED ORDER — ATORVASTATIN CALCIUM 20 MG PO TABS
20.0000 mg | ORAL_TABLET | Freq: Every day | ORAL | Status: DC
  Administered 2016-07-11 – 2016-07-15 (×5): 20 mg via ORAL
  Filled 2016-07-11 (×5): qty 1

## 2016-07-11 MED ORDER — TIOTROPIUM BROMIDE MONOHYDRATE 18 MCG IN CAPS
18.0000 ug | ORAL_CAPSULE | Freq: Every day | RESPIRATORY_TRACT | Status: DC
  Administered 2016-07-11 – 2016-07-15 (×5): 18 ug via RESPIRATORY_TRACT
  Filled 2016-07-11: qty 5

## 2016-07-11 MED ORDER — POTASSIUM CHLORIDE 20 MEQ PO PACK
20.0000 meq | PACK | ORAL | Status: AC
  Administered 2016-07-11 (×2): 20 meq via ORAL
  Filled 2016-07-11 (×2): qty 1

## 2016-07-11 MED ORDER — ENOXAPARIN SODIUM 40 MG/0.4ML ~~LOC~~ SOLN
40.0000 mg | SUBCUTANEOUS | Status: DC
  Administered 2016-07-11: 40 mg via SUBCUTANEOUS
  Filled 2016-07-11: qty 0.4

## 2016-07-11 MED ORDER — POTASSIUM CHLORIDE CRYS ER 20 MEQ PO TBCR
40.0000 meq | EXTENDED_RELEASE_TABLET | ORAL | Status: AC
  Administered 2016-07-11 (×2): 40 meq via ORAL
  Filled 2016-07-11 (×2): qty 2

## 2016-07-11 MED ORDER — ASPIRIN EC 81 MG PO TBEC
81.0000 mg | DELAYED_RELEASE_TABLET | Freq: Every day | ORAL | Status: DC
Start: 2016-07-11 — End: 2016-07-15
  Administered 2016-07-11 – 2016-07-15 (×5): 81 mg via ORAL
  Filled 2016-07-11 (×5): qty 1

## 2016-07-11 MED ORDER — INSULIN ASPART 100 UNIT/ML ~~LOC~~ SOLN
0.0000 [IU] | Freq: Three times a day (TID) | SUBCUTANEOUS | Status: DC
  Administered 2016-07-11: 2 [IU] via SUBCUTANEOUS
  Administered 2016-07-11: 5 [IU] via SUBCUTANEOUS
  Administered 2016-07-11: 7 [IU] via SUBCUTANEOUS
  Administered 2016-07-12: 3 [IU] via SUBCUTANEOUS
  Administered 2016-07-12: 7 [IU] via SUBCUTANEOUS
  Administered 2016-07-12: 1 [IU] via SUBCUTANEOUS
  Administered 2016-07-13: 3 [IU] via SUBCUTANEOUS
  Administered 2016-07-13 – 2016-07-14 (×2): 2 [IU] via SUBCUTANEOUS
  Administered 2016-07-14: 7 [IU] via SUBCUTANEOUS
  Filled 2016-07-11: qty 2
  Filled 2016-07-11: qty 7
  Filled 2016-07-11: qty 1
  Filled 2016-07-11: qty 5
  Filled 2016-07-11: qty 3
  Filled 2016-07-11: qty 2
  Filled 2016-07-11: qty 7
  Filled 2016-07-11: qty 2
  Filled 2016-07-11: qty 7
  Filled 2016-07-11: qty 3

## 2016-07-11 MED ORDER — FUROSEMIDE 10 MG/ML IJ SOLN
40.0000 mg | Freq: Once | INTRAMUSCULAR | Status: AC
  Administered 2016-07-11: 40 mg via INTRAVENOUS
  Filled 2016-07-11: qty 4

## 2016-07-11 MED ORDER — CETYLPYRIDINIUM CHLORIDE 0.05 % MT LIQD
7.0000 mL | Freq: Two times a day (BID) | OROMUCOSAL | Status: DC
  Administered 2016-07-11 – 2016-07-15 (×9): 7 mL via OROMUCOSAL

## 2016-07-11 MED ORDER — FUROSEMIDE 40 MG PO TABS
40.0000 mg | ORAL_TABLET | Freq: Every day | ORAL | Status: DC
  Administered 2016-07-11: 40 mg via ORAL
  Filled 2016-07-11: qty 1

## 2016-07-11 MED ORDER — MOMETASONE FURO-FORMOTEROL FUM 200-5 MCG/ACT IN AERO
2.0000 | INHALATION_SPRAY | Freq: Two times a day (BID) | RESPIRATORY_TRACT | Status: DC
  Administered 2016-07-11 – 2016-07-15 (×9): 2 via RESPIRATORY_TRACT
  Filled 2016-07-11: qty 8.8

## 2016-07-11 MED ORDER — MAGNESIUM SULFATE 4 GM/100ML IV SOLN
4.0000 g | Freq: Once | INTRAVENOUS | Status: AC
  Administered 2016-07-11: 4 g via INTRAVENOUS
  Filled 2016-07-11 (×2): qty 100

## 2016-07-11 MED ORDER — IPRATROPIUM-ALBUTEROL 0.5-2.5 (3) MG/3ML IN SOLN: 3.0000 mL | RESPIRATORY_TRACT | Status: DC | PRN

## 2016-07-11 MED ORDER — ACETAMINOPHEN 325 MG PO TABS
650.0000 mg | ORAL_TABLET | Freq: Four times a day (QID) | ORAL | Status: DC | PRN
  Administered 2016-07-12: 650 mg via ORAL
  Filled 2016-07-11: qty 2

## 2016-07-11 MED ORDER — PIPERACILLIN-TAZOBACTAM 3.375 G IVPB
3.3750 g | Freq: Three times a day (TID) | INTRAVENOUS | Status: DC
  Administered 2016-07-11 – 2016-07-14 (×9): 3.375 g via INTRAVENOUS
  Filled 2016-07-11 (×12): qty 50

## 2016-07-11 MED ORDER — ONDANSETRON HCL 4 MG/2ML IJ SOLN: 4.0000 mg | Freq: Four times a day (QID) | INTRAMUSCULAR | Status: DC | PRN

## 2016-07-11 MED ORDER — FUROSEMIDE 10 MG/ML IJ SOLN
40.0000 mg | Freq: Two times a day (BID) | INTRAMUSCULAR | Status: DC
  Administered 2016-07-11 – 2016-07-15 (×8): 40 mg via INTRAVENOUS
  Filled 2016-07-11 (×8): qty 4

## 2016-07-11 MED ORDER — ACETAMINOPHEN 650 MG RE SUPP: 650.0000 mg | Freq: Four times a day (QID) | RECTAL | Status: DC | PRN

## 2016-07-11 MED ORDER — SODIUM CHLORIDE 0.9% FLUSH
3.0000 mL | Freq: Two times a day (BID) | INTRAVENOUS | Status: DC
  Administered 2016-07-11 – 2016-07-15 (×7): 3 mL via INTRAVENOUS

## 2016-07-11 NOTE — Progress Notes (Signed)
Pharmacy Consult for Electrolyte Monitoring and Replacement Indication: Hypokalemia, Hypomagnesemia  No Known Allergies  Patient Measurements: Height: 5\' 10"  (177.8 cm) Weight: 186 lb (84.4 kg) IBW/kg (Calculated) : 73  Vital Signs: Temp: 98.4 F (36.9 C) (08/13 0821) Temp Source: Oral (08/13 0821) BP: 142/53 (08/13 0821) Pulse Rate: 95 (08/13 0821) Intake/Output from previous day: 08/12 0701 - 08/13 0700 In: -  Out: 2300 [Urine:2300] Intake/Output from this shift: No intake/output data recorded.  Labs:  Recent Labs  07/10/16 2048 07/11/16 0615  WBC 8.0 6.3  HGB 9.3* 8.9*  HCT 28.5* 27.2*  PLT 256 243     Recent Labs  07/10/16 2048 07/11/16 0615  NA 140 142  K 3.2* 2.6*  CL 100* 97*  CO2 33* 37*  GLUCOSE 201* 150*  BUN 31* 25*  CREATININE 1.66* 1.62*  CALCIUM 7.7* 7.4*  MG  --  1.1*  PROT 7.0  --   ALBUMIN 3.2*  --   AST 18  --   ALT 24  --   ALKPHOS 107  --   BILITOT 0.3  --    Estimated Creatinine Clearance: 38.2 mL/min (by C-G formula based on SCr of 1.62 mg/dL).    Recent Labs  07/11/16 0736  GLUCAP 161*    Medical History: Past Medical History:  Diagnosis Date  . Anemia   . Aortic regurgitation   . B-cell lymphoma ([place]) 2009   DX AT [facility]  . Bronchitis   . CAD (coronary artery disease)   . Carotid stenosis   . Diabetes mellitus, type 2 ([place])   . Emphysema of lung ([place])   . Essential hypertension   . History of chemotherapy   . Hyperlipidemia   . IDA (iron deficiency anemia)   . Leg edema   . Meralgia paraesthetica   . Mitral regurgitation   . PUD (peptic ulcer disease)   . PVD (peripheral vascular disease) ([place])   . Tobacco abuse     Medications:  Scheduled:  . antiseptic oral rinse  7 mL Mouth Rinse BID  . aspirin EC  81 mg Oral Daily  . atorvastatin  20 mg Oral Daily  . clopidogrel  75 mg Oral Daily  . diltiazem  180 mg Oral Daily  . enoxaparin (LOVENOX) injection  40 mg Subcutaneous Q24H  . furosemide  40 mg Oral  Daily  . gabapentin  100 mg Oral QHS  . insulin aspart  0-5 Units Subcutaneous QHS  . insulin aspart  0-9 Units Subcutaneous TID WC  . ipratropium-albuterol  3 mL Nebulization Q4H  . magnesium sulfate 1 - 4 g bolus IVPB  4 g Intravenous Once  . metoprolol  200 mg Oral Daily  . mometasone-formoterol  2 puff Inhalation BID  . piperacillin-tazobactam (ZOSYN)  IV  3.375 g Intravenous Q8H  . potassium chloride  40 mEq Oral Q4H  . sodium chloride flush  3 mL Intravenous Q12H  . tiotropium  18 mcg Inhalation Daily  . vancomycin  1,250 mg Intravenous Q24H    Assessment: Pharmacy consulted to assist in replacing electrolytes in this 81 y/o M admitted with acute CHF.   K= 2.6, Mag= 1.1  Plan:  Magnesium sulfate 4 g IV once and potassium chloride 40 meq po x 2. Will recheck potassium at 1800 and all electrolytes with AM labs.   Ulice Dash D 07/11/2016,8:25 AM

## 2016-07-11 NOTE — Progress Notes (Signed)
Pharmacy Consult for Electrolyte Monitoring and Replacement Indication: Hypokalemia, Hypomagnesemia  No Known Allergies  Patient Measurements: Height: 5\' 10"  (177.8 cm) Weight: 186 lb (84.4 kg) IBW/kg (Calculated) : 73  Vital Signs: Temp: 98.2 F (36.8 C) (08/13 1940) Temp Source: Oral (08/13 1940) BP: 122/56 (08/13 1940) Pulse Rate: 84 (08/13 1943) Intake/Output from previous day: 08/12 0701 - 08/13 0700 In: -  Out: 2300 [Urine:2300] Intake/Output from this shift: No intake/output data recorded.  Labs:  Recent Labs  07/10/16 2048 07/11/16 0615  WBC 8.0 6.3  HGB 9.3* 8.9*  HCT 28.5* 27.2*  PLT 256 243     Recent Labs  07/10/16 2048 07/11/16 0615 07/11/16 1804  NA 140 142  --   K 3.2* 2.6* 3.0*  CL 100* 97*  --   CO2 33* 37*  --   GLUCOSE 201* 150*  --   BUN 31* 25*  --   CREATININE 1.66* 1.62*  --   CALCIUM 7.7* 7.4*  --   MG  --  1.1* 2.0  PROT 7.0  --   --   ALBUMIN 3.2*  --   --   AST 18  --   --   ALT 24  --   --   ALKPHOS 107  --   --   BILITOT 0.3  --   --    Estimated Creatinine Clearance: 38.2 mL/min (by C-G formula based on SCr of 1.62 mg/dL).    Recent Labs  07/11/16 0736 07/11/16 1142 07/11/16 1649  GLUCAP 161* 315* 261*    Medical History: Past Medical History:  Diagnosis Date  . Anemia   . Aortic regurgitation   . B-cell lymphoma ([place]) 2009   DX AT [facility]  . Bronchitis   . CAD (coronary artery disease)   . Carotid stenosis   . Diabetes mellitus, type 2 ([place])   . Emphysema of lung ([place])   . Essential hypertension   . History of chemotherapy   . Hyperlipidemia   . IDA (iron deficiency anemia)   . Leg edema   . Meralgia paraesthetica   . Mitral regurgitation   . PUD (peptic ulcer disease)   . PVD (peripheral vascular disease) ([place])   . Tobacco abuse     Medications:  Scheduled:  . antiseptic oral rinse  7 mL Mouth Rinse BID  . aspirin EC  81 mg Oral Daily  . atorvastatin  20 mg Oral Daily  . clopidogrel  75 mg  Oral Daily  . diltiazem  180 mg Oral Daily  . enoxaparin (LOVENOX) injection  40 mg Subcutaneous Q24H  . furosemide  40 mg Intravenous BID  . gabapentin  100 mg Oral QHS  . insulin aspart  0-5 Units Subcutaneous QHS  . insulin aspart  0-9 Units Subcutaneous TID WC  . ipratropium-albuterol  3 mL Nebulization Q4H  . metoprolol  200 mg Oral Daily  . mometasone-formoterol  2 puff Inhalation BID  . piperacillin-tazobactam (ZOSYN)  IV  3.375 g Intravenous Q8H  . potassium chloride  20 mEq Oral Q2H  . sodium chloride flush  3 mL Intravenous Q12H  . tiotropium  18 mcg Inhalation Daily    Assessment: Pharmacy consulted to assist in replacing electrolytes in this 81 y/o M admitted with acute CHF.   Follow up labs this evening: K = 3.0 Mg = 2.0  Plan:  Order potassium chloride 20 mg PO every 2 hours x 2 doses  Will recheck electrolytes with AM labs.   Stanton Kidney  Shelda Jakes, PharmD Clinical Pharmacist 07/11/2016,7:58 PM

## 2016-07-11 NOTE — ED Notes (Signed)
Pt's O2 sat decreased to 87%. Pt's O2 increased to 5L [place]

## 2016-07-11 NOTE — Progress Notes (Signed)
*  PRELIMINARY RESULTS* Echocardiogram 2D Echocardiogram has been performed.  Brett [name] 07/11/2016, 5:54 PM

## 2016-07-11 NOTE — Progress Notes (Signed)
Patient arrived to 2A Room 238. Patient denies pain and all questions answered. Patient oriented to unit and Fall Safety Plan signed. Skin assessment completed with Suzzanne Cloud and skin intact. A&Ox4, VSS, and ST on verified tele box #[id number]. Nursing staff will continue to monitor for any changes in patient status. Earleen Reaper, RN

## 2016-07-11 NOTE — Progress Notes (Signed)
Pt states he wears a CPAP at home at night, there is no order, MD paged, Dr. Ara Kussmaul gave to order to continue CPAP here. Respiratory has been called to let them know he needs the machine & to help with the settings. Will continue to monitor. Conley Simmonds, RN

## 2016-07-11 NOTE — Consult Note (Signed)
Brett [name] is a 81 y.o. male  [id number]  Primary Cardiologist: Neoma Laming Reason for Consultation: CHF  HPI: Brett [name] presented with a three-day history of progressive shortness of breath and increased swelling. He reports that his physical therapy tech noted a low oxygen saturation and patient was instructed to go to the hospital. The patient was recently hospitalized in July 2017 for COPD exacerbation which time an echocardiogram was done on 06/19/2016 showing a normal LV systolic function with EF [id number], normal diastolic function, mild LVH, pulmonary artery pressures moderate lead to severely elevated, and mild to moderate aortic stenosis and mild aortic regurgitation. The patient was a longtime smoker having quit approximately 1 year ago. On this admission chest x-ray indicates bilateral pleural effusions and pneumonia. He also has an elevated BNP of 427 and negative troponins. He is currently being treated with antibiotics for pneumonia and furosemide for symptoms of heart failure.  Coronary CT angiography 11/2015- [id number], Mild mid left circ disease, mild mid RCA disease, diffusely calcified LAD with no significant CAD   Review of Systems: Negative for chest pain or pressure, shortness of breath with exertion has improved since admission. Positive for 1+ bilateral pedal edema, improved since admission   Past Medical History:  Diagnosis Date  . Anemia   . Aortic regurgitation   . B-cell lymphoma ([place]) 2009   DX AT [facility]  . Bronchitis   . CAD (coronary artery disease)   . Carotid stenosis   . Diabetes mellitus, type 2 ([place])   . Emphysema of lung ([place])   . Essential hypertension   . History of chemotherapy   . Hyperlipidemia   . IDA (iron deficiency anemia)   . Leg edema   . Meralgia paraesthetica   . Mitral regurgitation   . PUD (peptic ulcer disease)   . PVD (peripheral vascular disease) ([place])   . Tobacco abuse     Medications Prior to Admission  Medication Sig  Dispense Refill  . amLODipine (NORVASC) 5 MG tablet Take 5 mg by mouth daily.     Marland Kitchen aspirin (ADULT ASPIRIN EC LOW STRENGTH) 81 MG EC tablet Take 1 tablet by mouth daily.    Marland Kitchen atorvastatin (LIPITOR) 20 MG tablet Take 20 mg by mouth daily.    . clopidogrel (PLAVIX) 75 MG tablet Take 75 mg by mouth daily.     Marland Kitchen diltiazem (CARDIZEM CD) 180 MG 24 hr capsule Take 1 capsule (180 mg total) by mouth daily. 30 capsule 0  . Fluticasone-Salmeterol (ADVAIR DISKUS) 250-50 MCG/DOSE AEPB Inhale 1 puff into the lungs 2 times daily at 12 noon and 4 pm. 1 each 0  . furosemide (LASIX) 40 MG tablet Take 1 tablet by mouth daily.    Marland Kitchen gabapentin (NEURONTIN) 100 MG capsule Take 1 capsule by mouth at bedtime.     . hydrALAZINE (APRESOLINE) 25 MG tablet Take 2 tablets (50 mg total) by mouth 3 (three) times daily. 90 tablet 0  . hydrochlorothiazide (HYDRODIURIL) 25 MG tablet Take 25 mg by mouth daily.  3  . metoprolol (TOPROL-XL) 200 MG 24 hr tablet Take 1 tablet by mouth daily.    Marland Kitchen senna-docusate (SENOKOT-S) 8.6-50 MG tablet Take 1 tablet by mouth 2 (two) times daily.    Marland Kitchen tiotropium (SPIRIVA HANDIHALER) 18 MCG inhalation capsule Place 1 capsule (18 mcg total) into inhaler and inhale daily. 90 capsule 3     . antiseptic oral rinse  7 mL Mouth Rinse BID  . aspirin EC  81  mg Oral Daily  . atorvastatin  20 mg Oral Daily  . clopidogrel  75 mg Oral Daily  . diltiazem  180 mg Oral Daily  . enoxaparin (LOVENOX) injection  40 mg Subcutaneous Q24H  . furosemide  40 mg Intravenous BID  . gabapentin  100 mg Oral QHS  . insulin aspart  0-5 Units Subcutaneous QHS  . insulin aspart  0-9 Units Subcutaneous TID WC  . ipratropium-albuterol  3 mL Nebulization Q4H  . metoprolol  200 mg Oral Daily  . mometasone-formoterol  2 puff Inhalation BID  . piperacillin-tazobactam (ZOSYN)  IV  3.375 g Intravenous Q8H  . sodium chloride flush  3 mL Intravenous Q12H  . tiotropium  18 mcg Inhalation Daily    Infusions:    No Known  Allergies  Social History   Social History  . Marital status: Single    Spouse name: N/A  . Number of children: N/A  . Years of education: N/A   Occupational History  . retired    Social History Main Topics  . Smoking status: Former Smoker    Types: Cigarettes    Quit date: 08/30/2015  . Smokeless tobacco: Never Used  . Alcohol use No  . Drug use: No  . Sexual activity: Not on file   Other Topics Concern  . Not on file   Social History Narrative  . No narrative on file    Family History  Problem Relation Age of Onset  . Rheum arthritis Neg Hx   . Osteoarthritis Neg Hx   . Asthma Neg Hx   . Diabetes Neg Hx   . Cancer Neg Hx     PHYSICAL EXAM: Vitals:   07/11/16 0821 07/11/16 1140  BP: (!) 142/53 (!) 120/53  Pulse: 95 81  Resp: 18 17  Temp: 98.4 F (36.9 C) 97.6 F (36.4 C)     Intake/Output Summary (Last 24 hours) at 07/11/16 1450 Last data filed at 07/11/16 1216  Gross per 24 hour  Intake              100 ml  Output             3075 ml  Net            -2975 ml    General:  Well appearing. No respiratory difficulty HEENT: normal Neck: supple. no JVD. Carotids 2+ bilat; no bruits. No lymphadenopathy or thryomegaly appreciated. Cor: PMI nondisplaced. Regular rate & rhythm. No rubs, gallops or murmurs. Lungs: clear Abdomen: soft, nontender, nondistended. No hepatosplenomegaly. No bruits or masses. Good bowel sounds. Extremities: no cyanosis, clubbing, rash, edema Neuro: alert & oriented x 3, cranial nerves grossly intact. moves all 4 extremities w/o difficulty. Affect pleasant.  ECG: Normal sinus rhythm with T-wave inversions, antero-laterally which was not present in July 2017  Results for orders placed or performed during the hospital encounter of 07/10/16 (from the past 24 hour(s))  CBC with Differential     Status: Abnormal   Collection Time: 07/10/16  8:48 PM  Result Value Ref Range   WBC 8.0 3.8 - 10.6 K/uL   RBC 3.78 (L) 4.40 - 5.90 MIL/uL    Hemoglobin 9.3 (L) 13.0 - 18.0 g/dL   HCT 28.5 (L) 40.0 - 52.0 %   MCV 75.6 (L) 80.0 - 100.0 fL   MCH 24.6 (L) 26.0 - 34.0 pg   MCHC 32.6 32.0 - 36.0 g/dL   RDW 18.6 (H) 11.5 - 14.5 %   Platelets 256 150 -  440 K/uL   Neutrophils Relative % 80 %   Neutro Abs 6.4 1.4 - 6.5 K/uL   Lymphocytes Relative 7 %   Lymphs Abs 0.6 (L) 1.0 - 3.6 K/uL   Monocytes Relative 9 %   Monocytes Absolute 0.7 0.2 - 1.0 K/uL   Eosinophils Relative 3 %   Eosinophils Absolute 0.3 0 - 0.7 K/uL   Basophils Relative 1 %   Basophils Absolute 0.1 0 - 0.1 K/uL  Comprehensive metabolic panel     Status: Abnormal   Collection Time: 07/10/16  8:48 PM  Result Value Ref Range   Sodium 140 135 - 145 mmol/L   Potassium 3.2 (L) 3.5 - 5.1 mmol/L   Chloride 100 (L) 101 - 111 mmol/L   CO2 33 (H) 22 - 32 mmol/L   Glucose, Bld 201 (H) 65 - 99 mg/dL   BUN 31 (H) 6 - 20 mg/dL   Creatinine, Ser 1.66 (H) 0.61 - 1.24 mg/dL   Calcium 7.7 (L) 8.9 - 10.3 mg/dL   Total Protein 7.0 6.5 - 8.1 g/dL   Albumin 3.2 (L) 3.5 - 5.0 g/dL   AST 18 15 - 41 U/L   ALT 24 17 - 63 U/L   Alkaline Phosphatase 107 38 - 126 U/L   Total Bilirubin 0.3 0.3 - 1.2 mg/dL   GFR calc non Af Amer 38 (L) >60 mL/min   GFR calc Af Amer 44 (L) >60 mL/min   Anion gap 7 5 - 15  Troponin I     Status: None   Collection Time: 07/10/16  8:48 PM  Result Value Ref Range   Troponin I <0.03 <0.03 ng/mL  Brain natriuretic peptide     Status: Abnormal   Collection Time: 07/10/16  8:48 PM  Result Value Ref Range   B Natriuretic Peptide 427.0 (H) 0.0 - 100.0 pg/mL  Blood culture (routine x 2)     Status: None (Preliminary result)   Collection Time: 07/10/16 11:05 PM  Result Value Ref Range   Specimen Description BLOOD RIGHT ASSIST CONTROL    Special Requests      BOTTLES DRAWN AEROBIC AND ANAEROBIC 18CCAERO,16CCANA   Culture NO GROWTH < 12 HOURS    Report Status PENDING   Blood culture (routine x 2)     Status: None (Preliminary result)   Collection Time:  07/10/16 11:08 PM  Result Value Ref Range   Specimen Description BLOOD RIGHT HAND    Special Requests      BOTTLES DRAWN AEROBIC AND ANAEROBIC 14CCAERO,[id number]   Culture NO GROWTH < 12 HOURS    Report Status PENDING   Lactic acid, plasma     Status: None   Collection Time: 07/11/16 12:41 AM  Result Value Ref Range   Lactic Acid, Venous 1.1 0.5 - 1.9 mmol/L  Troponin I     Status: Abnormal   Collection Time: 07/11/16 12:41 AM  Result Value Ref Range   Troponin I 0.03 (HH) <0.03 ng/mL  Troponin I     Status: Abnormal   Collection Time: 07/11/16  6:15 AM  Result Value Ref Range   Troponin I 0.03 (HH) <0.03 ng/mL  Basic metabolic panel     Status: Abnormal   Collection Time: 07/11/16  6:15 AM  Result Value Ref Range   Sodium 142 135 - 145 mmol/L   Potassium 2.6 (LL) 3.5 - 5.1 mmol/L   Chloride 97 (L) 101 - 111 mmol/L   CO2 37 (H) 22 - 32 mmol/L  Glucose, Bld 150 (H) 65 - 99 mg/dL   BUN 25 (H) 6 - 20 mg/dL   Creatinine, Ser 1.62 (H) 0.61 - 1.24 mg/dL   Calcium 7.4 (L) 8.9 - 10.3 mg/dL   GFR calc non Af Amer 39 (L) >60 mL/min   GFR calc Af Amer 45 (L) >60 mL/min   Anion gap 8 5 - 15  CBC     Status: Abnormal   Collection Time: 07/11/16  6:15 AM  Result Value Ref Range   WBC 6.3 3.8 - 10.6 K/uL   RBC 3.59 (L) 4.40 - 5.90 MIL/uL   Hemoglobin 8.9 (L) 13.0 - 18.0 g/dL   HCT 27.2 (L) 40.0 - 52.0 %   MCV 75.8 (L) 80.0 - 100.0 fL   MCH 24.8 (L) 26.0 - 34.0 pg   MCHC 32.7 32.0 - 36.0 g/dL   RDW 19.0 (H) 11.5 - 14.5 %   Platelets 243 150 - 440 K/uL  Magnesium     Status: Abnormal   Collection Time: 07/11/16  6:15 AM  Result Value Ref Range   Magnesium 1.1 (L) 1.7 - 2.4 mg/dL  Glucose, capillary     Status: Abnormal   Collection Time: 07/11/16  7:36 AM  Result Value Ref Range   Glucose-Capillary 161 (H) 65 - 99 mg/dL  MRSA PCR Screening     Status: None   Collection Time: 07/11/16  7:43 AM  Result Value Ref Range   MRSA by PCR NEGATIVE NEGATIVE  Glucose, capillary      Status: Abnormal   Collection Time: 07/11/16 11:42 AM  Result Value Ref Range   Glucose-Capillary 315 (H) 65 - 99 mg/dL  Troponin I     Status: Abnormal   Collection Time: 07/11/16 11:45 AM  Result Value Ref Range   Troponin I 0.03 (HH) <0.03 ng/mL  TSH     Status: None   Collection Time: 07/11/16 11:45 AM  Result Value Ref Range   TSH 1.599 0.350 - 4.500 uIU/mL   Dg Chest 1 View  Result Date: 07/11/2016 CLINICAL DATA:  Pt came in through ED yesterday with left sided CP. Pt was diagnosed with PNA- follow up. Former smoker. Hx of CAD, Carotid stenosis, diabetes, emphysema, HTN, Mitral regurg. EXAM: CHEST 1 VIEW COMPARISON:  07/10/2016 FINDINGS: Moderate, right greater left, pleural effusions with associated lung base opacity, most likely atelectasis, are stable. No evidence of pulmonary edema. No pneumothorax. Cardiac silhouette is mildly enlarged but partly obscured by the pleural effusions. No mediastinal or hilar masses or convincing adenopathy. IMPRESSION: 1. No change from previous day's study. 2. Moderate, right greater left, pleural effusions with associated atelectasis. The lung base pneumonia is possible but felt less likely. No evidence of pulmonary edema. Electronically Signed   By: Lajean Manes M.D.   On: 07/11/2016 09:02   Dg Chest 2 View  Result Date: 07/10/2016 CLINICAL DATA:  Chest pain. EXAM: CHEST  2 VIEW COMPARISON:  06/17/2016. FINDINGS: Interval small to moderate-sized bilateral pleural effusions, larger on the right. Bibasilar airspace opacity with an appearance most compatible with atelectasis. Poor inspiration with no gross change in mild prominence of the pulmonary vasculature and interstitial markings. No gross change in mild cardiomegaly. Diffuse osteopenia. Thoracolumbar spine degenerative changes. IMPRESSION: Interval bilateral pleural effusions and bibasilar atelectasis. Underlying pneumonia cannot be excluded. Electronically Signed   By: Claudie Revering M.D.   On:  07/10/2016 22:05     ASSESSMENT AND PLAN: Congestive heart failure with elevated BNP of 427, shortness  of breath and hypoxia, pleural effusions on chest x-ray and increased lower extremity edema. Currently the patient is not having chest pain and his breathing has improved since admission, troponins have been less than 0.03 and 0.03 for 3 occurrences. Echo did not identify heart failure in July 2017. In light of EKG changes will repeat echo. Advised to continue with diuresis.  Daune Perch, NP 07/11/2016 2:50 PM

## 2016-07-11 NOTE — Progress Notes (Signed)
[facility] [place] NAME: Nygil Luttrull    MR#:  [id number]  DATE OF BIRTH:  1935/09/20  SUBJECTIVE:   Patient here with increasing lower extremity edema and shortness of breath.  Shortness of breath has improved however continues to have significant amount of lower extremity edema and abdominal fluid.  REVIEW OF SYSTEMS:    Review of Systems  Constitutional: Negative for chills, fever and malaise/fatigue.  HENT: Negative.  Negative for ear discharge, ear pain, hearing loss, nosebleeds and sore throat.   Eyes: Negative.  Negative for blurred vision and pain.  Respiratory: Positive for cough, shortness of breath and wheezing. Negative for hemoptysis.   Cardiovascular: Positive for leg swelling. Negative for chest pain and palpitations.  Gastrointestinal: Negative.  Negative for abdominal pain, blood in stool, diarrhea, nausea and vomiting.  Genitourinary: Negative.  Negative for dysuria.  Musculoskeletal: Negative.  Negative for back pain.  Skin: Negative.   Neurological: Positive for weakness. Negative for dizziness, tremors, speech change, focal weakness, seizures and headaches.  Endo/Heme/Allergies: Negative.  Does not bruise/bleed easily.  Psychiatric/Behavioral: Negative.  Negative for depression, hallucinations and suicidal ideas.    Tolerating Diet:yes      DRUG ALLERGIES:  No Known Allergies  VITALS:  Blood pressure (!) 142/53, pulse 95, temperature 98.4 F (36.9 C), temperature source Oral, resp. rate 18, height 5\' 10"  (1.778 m), weight 84.4 kg (186 lb), SpO2 91 %.  PHYSICAL EXAMINATION:   Physical Exam  Constitutional: He is oriented to person, place, and time and well-developed, well-nourished, and in no distress. No distress.  HENT:  Head: Normocephalic.  Eyes: No scleral icterus.  Neck: Normal range of motion. Neck supple. No JVD present. No tracheal deviation present.  Cardiovascular: Normal rate and regular  rhythm.  Exam reveals no gallop and no friction rub.   Murmur heard. Pulmonary/Chest: Effort normal and breath sounds normal. No respiratory distress. He has no wheezes. He has no rales. He exhibits no tenderness.  Abdominal: Soft. Bowel sounds are normal. He exhibits distension. He exhibits no mass. There is no tenderness. There is no rebound and no guarding.  Musculoskeletal: Normal range of motion. He exhibits edema.  Neurological: He is alert and oriented to person, place, and time.  Skin: Skin is warm. No rash noted. No erythema.  Psychiatric: Affect and judgment normal.      LABORATORY PANEL:   CBC  Recent Labs Lab 07/11/16 0615  WBC 6.3  HGB 8.9*  HCT 27.2*  PLT 243   ------------------------------------------------------------------------------------------------------------------  Chemistries   Recent Labs Lab 07/10/16 2048 07/11/16 0615  NA 140 142  K 3.2* 2.6*  CL 100* 97*  CO2 33* 37*  GLUCOSE 201* 150*  BUN 31* 25*  CREATININE 1.66* 1.62*  CALCIUM 7.7* 7.4*  MG  --  1.1*  AST 18  --   ALT 24  --   ALKPHOS 107  --   BILITOT 0.3  --    ------------------------------------------------------------------------------------------------------------------  Cardiac Enzymes  Recent Labs Lab 07/10/16 2048 07/11/16 0041 07/11/16 0615  TROPONINI <0.03 0.03* 0.03*   ------------------------------------------------------------------------------------------------------------------  RADIOLOGY:  Dg Chest 1 View  Result Date: 07/11/2016 CLINICAL DATA:  Pt came in through ED yesterday with left sided CP. Pt was diagnosed with PNA- follow up. Former smoker. Hx of CAD, Carotid stenosis, diabetes, emphysema, HTN, Mitral regurg. EXAM: CHEST 1 VIEW COMPARISON:  07/10/2016 FINDINGS: Moderate, right greater left, pleural effusions with associated lung base opacity, most likely atelectasis, are stable.  No evidence of pulmonary edema. No pneumothorax. Cardiac silhouette is  mildly enlarged but partly obscured by the pleural effusions. No mediastinal or hilar masses or convincing adenopathy. IMPRESSION: 1. No change from previous day's study. 2. Moderate, right greater left, pleural effusions with associated atelectasis. The lung base pneumonia is possible but felt less likely. No evidence of pulmonary edema. Electronically Signed   By: Lajean Manes M.D.   On: 07/11/2016 09:02   Dg Chest 2 View  Result Date: 07/10/2016 CLINICAL DATA:  Chest pain. EXAM: CHEST  2 VIEW COMPARISON:  06/17/2016. FINDINGS: Interval small to moderate-sized bilateral pleural effusions, larger on the right. Bibasilar airspace opacity with an appearance most compatible with atelectasis. Poor inspiration with no gross change in mild prominence of the pulmonary vasculature and interstitial markings. No gross change in mild cardiomegaly. Diffuse osteopenia. Thoracolumbar spine degenerative changes. IMPRESSION: Interval bilateral pleural effusions and bibasilar atelectasis. Underlying pneumonia cannot be excluded. Electronically Signed   By: Claudie Revering M.D.   On: 07/10/2016 22:05     ASSESSMENT AND PLAN:   81 year old male with a history of marginal zone lymphoma and COPD who presents with acute CHF exacerbation.  1. Acute on chronic diastolic heart failure with pEF: Patient had echocardiogram in July which shows preserved ejection fraction however he does have mild LVH and increased pulmonary arterial pressures. Patient will need IV Lasix. Continue to monitor daily weight and I/O Cardiology consult pending.   2. Acute hypoxic respiratory failure: Chest x-ray this morning is reporting bilateral effusions with atelectasis. There is also evidence of pneumonia. Start ISS. Continue ZOSYN. MRSA PCR Negative so therefore we'll discontinue May Glasson. If hypoxemia does not improve then consider ordering a lateral decubitus xrays to evaluate pleural effusions. Follow up on blood culture. 3.  Severe hypokalemia: Consult pharmacy to assist with bleeding. Check magnesium level as well.  4. COPD: Patient does not appear to be in exacerbation at this time. Continue outpatient regimen.   5. Anemia of chronic disease: Hemoglobin remains relatively stable. No need for transfusion at this time.  6. Diabetes: Continue sliding scale insulin and ADA diet.  7.history of marginal zone lymphoma: Patient follows at [facility].   Management plans discussed with the patient and he is in agreement.  CODE STATUS: FULL  TOTAL TIME TAKING CARE OF THIS PATIENT: 30 minutes.     POSSIBLE D/C 2-3 days, DEPENDING ON CLINICAL CONDITION.   Seynabou Fults M.D on 07/11/2016 at 11:03 AM  Between 7am to 6pm - Pager - [phone] After 6pm go to [email or web address] - password [email or web address] [facility]  Office  [phone]  CC: Primary care physician; Perrin Maltese, MD  Note: This dictation was prepared with Dragon dictation along with smaller phrase technology. Any transcriptional errors that result from this process are unintentional.

## 2016-07-11 NOTE — ED Notes (Signed)
Called floor to let them know pt on the way 

## 2016-07-11 NOTE — ED Notes (Signed)
Pt's O2 sat now 93% on 5L  

## 2016-07-11 NOTE — Evaluation (Signed)
Physical Therapy Evaluation Patient Details Name: Brett [name] MRN: [id number] DOB: [birth year] Today's Date: 07/11/2016   History of Present Illness   81 y.o. male who presents with 3 days of progressive shortness of breath as well as some left-sided chest pain. Patient states he has had some chills at home as well. Admitted with sepsis - possible pneumonia. Patient has significant lower extremity edema as well as some abdominal distention. PMH significant for emphysema, aortic regurgitation, PVD, DM, HLD and h/o B cell lymphoma.    Clinical Impression  Pt very hard of hearing, but able to participate well with PT and shows good confidence and safety with ambulation.  However his O2 is of concern t/o the session, at rest on 5 liters he was in the mid 80s, able to get to low 90s with focused breathing.  During brief bout of ambulation his sats dropped to low 80s and he did have significant fatigue.  Regarding strength, balance, etc pt did well, but his activity tolerance was very limited.     Follow Up Recommendations Home health PT    Equipment Recommendations  None recommended by PT    Recommendations for Other Services       Precautions / Restrictions Precautions Precautions: Fall Restrictions Weight Bearing Restrictions: No      Mobility  Bed Mobility Overal bed mobility: Independent             General bed mobility comments: No issues rising to EOB  Transfers Overall transfer level: Independent Equipment used: None             General transfer comment: Pt performs sit <> stand transfer from EOB without difficulty or physical assist.  Ambulation/Gait Ambulation/Gait assistance: Modified independent (Device/Increase time) Ambulation Distance (Feet): 50 Feet Assistive device: None       General Gait Details: Pt walks with good speed and confidence with no LOBs or safety issues, however O2 drops to 82% during ambulation on 6 liters. He reports some fatigue  but not excessive, O2 increases to 90s on 5 liters relatively quickly sitting at rest.   Stairs            Wheelchair Mobility    Modified Rankin (Stroke Patients Only)       Balance Overall balance assessment: Independent   Sitting balance-Leahy Scale: Good       Standing balance-Leahy Scale: Good                               Pertinent Vitals/Pain Pain Assessment: No/denies pain    Home Living Family/patient expects to be discharged to:: Private residence Living Arrangements: Alone Available Help at Discharge: Friend(s) (stops by QD, assists with grocery shopping, etc) Type of Home: House Home Access: Stairs to enter Entrance Stairs-Rails: Left Entrance Stairs-Number of Steps: 3   Home Equipment:  (O2)      Prior Function Level of Independence: Independent         Comments: Pt reports he does not get out much secondary to O2, but is able to do all he needs in the home     Hand Dominance        Extremity/Trunk Assessment   Upper Extremity Assessment: Overall WFL for tasks assessed           Lower Extremity Assessment: Overall WFL for tasks assessed         Communication   Communication: [facility]  Cognition Arousal/Alertness: Awake/alert  Behavior During Therapy: WFL for tasks assessed/performed Overall Cognitive Status: Within Functional Limits for tasks assessed                      General Comments      Exercises        Assessment/Plan    PT Assessment Patient needs continued PT services  PT Diagnosis Generalized weakness   PT Problem List Decreased activity tolerance;Cardiopulmonary status limiting activity  PT Treatment Interventions Gait training;Stair training;Functional mobility training;Therapeutic activities;Therapeutic exercise;Balance training;Patient/family education   PT Goals (Current goals can be found in the Care Plan section) Acute Rehab PT Goals Patient Stated Goal: go home PT Goal  Formulation: With patient Time For Goal Achievement: 07/25/16 Potential to Achieve Goals: Good    Frequency Min 2X/week   Barriers to discharge        Co-evaluation               End of Session Equipment Utilized During Treatment: Gait belt;Oxygen (5 liters in bed, 6 during ambulation) Activity Tolerance: Patient limited by fatigue Patient left: with bed alarm set;with call bell/phone within reach           Time: 1300-1315 PT Time Calculation (min) (ACUTE ONLY): 15 min   Charges:   PT Evaluation $PT Eval Low Complexity: 1 Procedure     PT G Codes:        Kreg Shropshire, DPT 07/11/2016, 2:40 PM

## 2016-07-11 NOTE — Progress Notes (Addendum)
Pharmacy Antibiotic Note  Brett [name] is a 81 y.o. male admitted on 07/10/2016 with sepsis.  Pharmacy has been consulted for vancomycin and Zosyn dosing.  Plan: DW 86.2kg   Vd 60L kei 0.035 hr-1  T1/2 20 hours Vancomycin 1250 mg q 24 hours ordered with stacked dosing. Level before 5th dose. Goal trough 15-20.  Zosyn 3.375 grams q 8 hours ordered.    Height: 5\' 10"  (177.8 cm) Weight: 187 lb 12.8 oz (85.2 kg) IBW/kg (Calculated) : 73  Temp (24hrs), Avg:98.6 F (37 C), Min:97.7 F (36.5 C), Max:99.6 F (37.6 C)   Recent Labs Lab 07/10/16 2048  WBC 8.0  CREATININE 1.66*    Estimated Creatinine Clearance: 37.3 mL/min (by C-G formula based on SCr of 1.66 mg/dL).    No Known Allergies  Antimicrobials this admission: vancomycin  >>  Zosyn  >>   Dose adjustments this admission:   Microbiology results: 8/12 BCx: pending    Thank you for allowing pharmacy to be a part of this patient's care.  Cole Klugh S 07/11/2016 12:35 AM

## 2016-07-12 ENCOUNTER — Inpatient Hospital Stay: Payer: Medicare PPO

## 2016-07-12 LAB — GLUCOSE, CAPILLARY
GLUCOSE-CAPILLARY: 323 mg/dL — AB (ref 65–99)
GLUCOSE-CAPILLARY: 148 mg/dL — AB (ref 65–99)
GLUCOSE-CAPILLARY: 176 mg/dL — AB (ref 65–99)
Glucose-Capillary: 207 mg/dL — ABNORMAL HIGH (ref 65–99)

## 2016-07-12 LAB — ECHOCARDIOGRAM COMPLETE
Height: 70 in
Weight: 2976 oz

## 2016-07-12 LAB — BASIC METABOLIC PANEL
Anion gap: 11 (ref 5–15)
BUN: 32 mg/dL — AB (ref 6–20)
CHLORIDE: 95 mmol/L — AB (ref 101–111)
CO2: 34 mmol/L — AB (ref 22–32)
Calcium: 7.4 mg/dL — ABNORMAL LOW (ref 8.9–10.3)
Creatinine, Ser: 2 mg/dL — ABNORMAL HIGH (ref 0.61–1.24)
GFR calc non Af Amer: 30 mL/min — ABNORMAL LOW (ref >60)
GFR, EST AFRICAN AMERICAN: 35 mL/min — AB (ref >60)
Glucose, Bld: 187 mg/dL — ABNORMAL HIGH (ref 65–99)
POTASSIUM: 3.6 mmol/L (ref 3.5–5.1)
SODIUM: 140 mmol/L (ref 135–145)

## 2016-07-12 LAB — MAGNESIUM: MAGNESIUM: 1.9 mg/dL (ref 1.7–2.4)

## 2016-07-12 LAB — PHOSPHORUS: PHOSPHORUS: 4.6 mg/dL (ref 2.5–4.6)

## 2016-07-12 MED ORDER — HEPARIN (PORCINE) IN NACL 2-0.9 UNIT/ML-% IJ SOLN
INTRAMUSCULAR | Status: AC
  Filled 2016-07-12: qty 500

## 2016-07-12 MED ORDER — ENOXAPARIN SODIUM 40 MG/0.4ML ~~LOC~~ SOLN
40.0000 mg | SUBCUTANEOUS | Status: DC
  Administered 2016-07-12: 40 mg via SUBCUTANEOUS
  Filled 2016-07-12 (×2): qty 0.4

## 2016-07-12 MED ORDER — SODIUM CHLORIDE 0.9% FLUSH: 3.0000 mL | INTRAVENOUS | Status: DC | PRN

## 2016-07-12 MED ORDER — SODIUM CHLORIDE 0.9 % WEIGHT BASED INFUSION: 1.0000 mL/kg/h | INTRAVENOUS | Status: DC

## 2016-07-12 MED ORDER — MIDAZOLAM HCL 2 MG/2ML IJ SOLN
INTRAMUSCULAR | Status: AC
  Filled 2016-07-12: qty 2

## 2016-07-12 MED ORDER — LIVING WELL WITH DIABETES BOOK
Freq: Once | Status: AC
  Administered 2016-07-12: 22:00:00
  Filled 2016-07-12: qty 1

## 2016-07-12 MED ORDER — ENOXAPARIN SODIUM 30 MG/0.3ML ~~LOC~~ SOLN: 30.0000 mg | SUBCUTANEOUS | Status: DC

## 2016-07-12 MED ORDER — SODIUM CHLORIDE 0.9% FLUSH
3.0000 mL | Freq: Two times a day (BID) | INTRAVENOUS | Status: DC
Start: 2016-07-12 — End: 2016-07-13
  Administered 2016-07-12 (×2): 3 mL via INTRAVENOUS

## 2016-07-12 MED ORDER — ASPIRIN 81 MG PO CHEW
81.0000 mg | CHEWABLE_TABLET | ORAL | Status: AC
  Administered 2016-07-13: 81 mg via ORAL
  Filled 2016-07-12: qty 1

## 2016-07-12 MED ORDER — FENTANYL CITRATE (PF) 100 MCG/2ML IJ SOLN
INTRAMUSCULAR | Status: AC
  Filled 2016-07-12: qty 2

## 2016-07-12 MED ORDER — SODIUM CHLORIDE 0.9 % IV SOLN
250.0000 mL | INTRAVENOUS | Status: DC | PRN
  Administered 2016-07-12: 1000 mL via INTRAVENOUS

## 2016-07-12 MED ORDER — SODIUM CHLORIDE 0.9 % WEIGHT BASED INFUSION: 3.0000 mL/kg/h | INTRAVENOUS | Status: DC

## 2016-07-12 NOTE — Progress Notes (Addendum)
Inpatient Diabetes Program Recommendations  AACE/ADA: New Consensus Statement on Inpatient Glycemic Control (2015)  Target Ranges:  Prepandial:   less than 140 mg/dL      Peak postprandial:   less than 180 mg/dL (1-2 hours)      Critically ill patients:  140 - 180 mg/dL   Results for Brett [name], Brett [name] (MRN [id number]) as of 07/12/2016 09:23  Ref. Range 07/11/2016 07:36 07/11/2016 11:42 07/11/2016 16:49 07/11/2016 21:29 07/12/2016 07:33  Glucose-Capillary Latest Ref Range: 65 - 99 mg/dL 161 (H) 315 (H) 261 (H) 217 (H) 207 (H)  Results for Brett, [name] (MRN [id number]) as of 07/12/2016 15:11  Ref. Range 06/20/2016 05:04  Hemoglobin A1C Latest Ref Range: 4.0 - 6.0 % 7.7 (H)   Review of Glycemic Control  Diabetes history: DM2 Outpatient Diabetes medications: No DM meds listed on home medication list Current orders for Inpatient glycemic control: Novolog 0-9 units TID with meals, Novolog 0-5 units QHS  Inpatient Diabetes Program Recommendations: Insulin - Basal: Please consider ordering low dose basal insulin. Recommend starting with Lantus 8 units Q24H starting now (based on 84 kg x 0.1 units). Outpatient DM medications: If patient will be prescribed DM medication at time of discharge, please ensure patient has a prescription for medications along with instructions to take medication as prescribed and follow up with PCP.  NOTE: In reviewing the chart, noted patient use to be on Amaryl for DM control which was discontinued on 06/21/16 per Discharge Summary. Patient was discharged from last hospitalization on 06/21/16 on Lantus 7 units BID (new to insulin) for diabetes control.  Addendum 07/12/16@15 :01- Went to speak with patient about DM and clarify if patient was taking DM medications. Of note, patient is very hard of hearing. Patient states that he does NOT have diabetes. Discussed documentation of DM2 hx in chart and patient continues to deny any prior diagnosis of diabetes. Patient reports that  he does NOT take any medications as an outpatient for DM. Inquired about last discharge from hospital on 06/21/16 at which time Amaryl was discontinued and patient was prescribed Lantus and was also educated on insulin administration on 06/21/16 by Thomas Hoff, RN (according to chart review). Again patient states that he does not have diabetes and is adamant that he does not take any DM medications as an outpatient. Patient reports that he does not have any of his outpatient medications with him at the hospital and that he gets his medications from [facility] order. In further talking with patient he states that he does monitor his glucose and it ranges from 70-150's mg/dl when he checks it. Patient reports that his PCP asked him to check his glucose daily. Will order Living Well with Diabetes booklet and patient education on DM by bedside RNs. At time of discharge, MD will need to be sure patient is prescribed DM medication if continued as an outpatient.  Thanks, Barnie Alderman, RN, MSN, CDE Diabetes Coordinator Inpatient Diabetes Program [phone] (Team Pager from [place] to [place]) [phone] ([place] office) [phone] [facility] office) [phone] [facility] office)

## 2016-07-12 NOTE — Care Management (Signed)
[facility] on [facility] through [facility]

## 2016-07-12 NOTE — Progress Notes (Signed)
Physical Therapy Treatment Patient Details Name: Brett [name] MRN: [id number] DOB: [birth year] Today's Date: 07/12/2016    History of Present Illness  81 y.o. male who presents with 3 days of progressive shortness of breath as well as some left-sided chest pain. Patient states he has had some chills at home as well. Admitted with sepsis - possible pneumonia. Patient has significant lower extremity edema as well as some abdominal distention. PMH significant for emphysema, aortic regurgitation, PVD, DM, HLD and h/o B cell lymphoma.      PT Comments    Pt initially refuses PT; agreeable with gentle encouragement. Pt just finishing breathing treatment. Respiratory therapist notes 88% O2 saturation is okay for pt. Pt O2 saturation throughout session is 87-88%. Pt participates well with supine and seated exercises. Pt notes he does not wish to use assistive device for stand/ambulation. Pt demonstrates increased difficulty with sit to stand tranfers today and short ambulation requiring Min assist to attain/maintain balance without device. Several sit to stand transfers performed before ability to ambulate to chair. Pt up in chair comfortably. Discussed need for assistive device at this time due to decreased strength and balance. Continue PT to progress strength, endurance and balance to improve functional mobility and allow for safe return home.   Follow Up Recommendations  Home health PT     Equipment Recommendations  None recommended by PT    Recommendations for Other Services       Precautions / Restrictions Precautions Precautions: Fall Restrictions Weight Bearing Restrictions: No    Mobility  Bed Mobility Overal bed mobility: Modified Independent             General bed mobility comments: use of rails; increased time/effort  Transfers Overall transfer level: Needs assistance Equipment used: None Transfers: Sit to/from Stand Sit to Stand: Min guard;Min assist          General transfer comment: STS performed several times without AD, as pt does not wish to use. Pt unsteady/ataxic with STS requiring assist to attain/maintain balance Pt attempts with large arm movements/lean against bed to gain balance and falls to bed in seated position 3 of 4 times.    Ambulation/Gait Ambulation/Gait assistance: Min assist Ambulation Distance (Feet): 5 Feet Assistive device: None Gait Pattern/deviations: Decreased step length - right (short, choppy steps bed to chair) Gait velocity: reduced Gait velocity interpretation: Below normal speed for age/gender General Gait Details: Min A for steadiness bed to chair   Stairs            Wheelchair Mobility    Modified Rankin (Stroke Patients Only)       Balance Overall balance assessment: Needs assistance Sitting-balance support: Feet supported Sitting balance-Leahy Scale: Good     Standing balance support: No upper extremity supported Standing balance-Leahy Scale: Poor                      Cognition Arousal/Alertness: Awake/alert Behavior During Therapy: WFL for tasks assessed/performed Overall Cognitive Status: Within Functional Limits for tasks assessed                      Exercises General Exercises - Lower Extremity Ankle Circles/Pumps: AROM;Both;20 reps;Supine Quad Sets: Strengthening;Both;20 reps;Supine Gluteal Sets: Strengthening;Both;20 reps;Supine Short Arc Quad: AROM;Both;20 reps;Supine Long Arc Quad: AROM;Both;20 reps;Seated Heel Slides: AROM;Both;20 reps;Supine Hip ABduction/ADduction: AROM;Both;20 reps;Supine Straight Leg Raises: Strengthening;Both;10 reps;Supine Hip Flexion/Marching: AROM;Both;20 reps;Seated    General Comments        Pertinent Vitals/Pain Pain  Assessment: No/denies pain    Home Living                      Prior Function            PT Goals (current goals can now be found in the care plan section) Progress towards PT goals:  Progressing toward goals    Frequency  Min 2X/week    PT Plan Current plan remains appropriate    Co-evaluation             End of Session Equipment Utilized During Treatment: Gait belt;Oxygen Activity Tolerance: Patient limited by fatigue;Patient tolerated treatment well Patient left: in chair;with call bell/phone within reach;with chair alarm set     Time: [id number] PT Time Calculation (min) (ACUTE ONLY): 33 min  Charges:  $Gait Training: 8-22 mins $Therapeutic Exercise: 8-22 mins                    G Codes:      Charlaine Dalton, PTA 07/12/2016, 12:16 PM

## 2016-07-12 NOTE — Progress Notes (Signed)
[facility]  Patient Status: Active  [facility] is providing the following services: SN/PT/MSW  If patient discharges after hours, please call [phone].   Brett [name] 07/12/2016, 9:16 AM

## 2016-07-12 NOTE — Progress Notes (Signed)
[name] at [place] NAME: Brett [name]    MR#:  [id number]  DATE OF BIRTH:  [birth year]  SUBJECTIVE:   Shortness of breath and lower extremity edema is improved. Continues to be on 6 L oxygen with saturations at 88%.  High risk for intubation if any worsening.   REVIEW OF SYSTEMS:    Review of Systems  Constitutional: Negative for chills, fever and malaise/fatigue.  HENT: Negative.  Negative for ear discharge, ear pain, hearing loss, nosebleeds and sore throat.   Eyes: Negative.  Negative for blurred vision and pain.  Respiratory: Positive for cough, shortness of breath and wheezing. Negative for hemoptysis.   Cardiovascular: Positive for leg swelling. Negative for chest pain and palpitations.  Gastrointestinal: Negative.  Negative for abdominal pain, blood in stool, diarrhea, nausea and vomiting.  Genitourinary: Negative.  Negative for dysuria.  Musculoskeletal: Negative.  Negative for back pain.  Skin: Negative.   Neurological: Positive for weakness. Negative for dizziness, tremors, speech change, focal weakness, seizures and headaches.  Endo/Heme/Allergies: Negative.  Does not bruise/bleed easily.  Psychiatric/Behavioral: Negative.  Negative for depression, hallucinations and suicidal ideas.   DRUG ALLERGIES:  No Known Allergies  VITALS:  Blood pressure (!) 110/42, pulse 92, temperature 98 F (36.7 C), resp. rate 18, height 5\' 10"  (1.778 m), weight 84.5 kg (186 lb 4.8 oz), SpO2 (!) 88 %.  PHYSICAL EXAMINATION:   Physical Exam  Constitutional: He is oriented to person, place, and time and well-developed, well-nourished, and in no distress. No distress.  HENT:  Head: Normocephalic.  Eyes: No scleral icterus.  Neck: Normal range of motion. Neck supple. No JVD present. No tracheal deviation present.  Cardiovascular: Normal rate and regular rhythm.  Exam reveals no gallop and no friction rub.   Murmur heard. Pulmonary/Chest:  Effort normal and breath sounds normal. No respiratory distress. He has no wheezes. He has no rales. He exhibits no tenderness.  Abdominal: Soft. Bowel sounds are normal. He exhibits distension. He exhibits no mass. There is no tenderness. There is no rebound and no guarding.  Musculoskeletal: Normal range of motion. He exhibits edema.  Neurological: He is alert and oriented to person, place, and time.  Skin: Skin is warm. No rash noted. No erythema.  Psychiatric: Affect and judgment normal.   LABORATORY PANEL:   CBC  Recent Labs Lab 07/11/16 0615  WBC 6.3  HGB 8.9*  HCT 27.2*  PLT 243   ------------------------------------------------------------------------------------------------------------------  Chemistries   Recent Labs Lab 07/10/16 2048  07/12/16 0412  NA 140  < > 140  K 3.2*  < > 3.6  CL 100*  < > 95*  CO2 33*  < > 34*  GLUCOSE 201*  < > 187*  BUN 31*  < > 32*  CREATININE 1.66*  < > 2.00*  CALCIUM 7.7*  < > 7.4*  MG  --   < > 1.9  AST 18  --   --   ALT 24  --   --   ALKPHOS 107  --   --   BILITOT 0.3  --   --   < > = values in this interval not displayed. ------------------------------------------------------------------------------------------------------------------  Cardiac Enzymes  Recent Labs Lab 07/11/16 0041 07/11/16 0615 07/11/16 1145  TROPONINI 0.03* 0.03* 0.03*   ------------------------------------------------------------------------------------------------------------------  RADIOLOGY:  Dg Chest 1 View  Result Date: 07/11/2016 CLINICAL DATA:  Pt came in through ED yesterday with left sided CP. Pt was diagnosed with PNA- follow up.  Former smoker. Hx of CAD, Carotid stenosis, diabetes, emphysema, HTN, Mitral regurg. EXAM: CHEST 1 VIEW COMPARISON:  07/10/2016 FINDINGS: Moderate, right greater left, pleural effusions with associated lung base opacity, most likely atelectasis, are stable. No evidence of pulmonary edema. No pneumothorax. Cardiac  silhouette is mildly enlarged but partly obscured by the pleural effusions. No mediastinal or hilar masses or convincing adenopathy. IMPRESSION: 1. No change from previous day's study. 2. Moderate, right greater left, pleural effusions with associated atelectasis. The lung base pneumonia is possible but felt less likely. No evidence of pulmonary edema. Electronically Signed   By: Lajean Manes M.D.   On: 07/11/2016 09:02   Dg Chest 2 View  Result Date: 07/10/2016 CLINICAL DATA:  Chest pain. EXAM: CHEST  2 VIEW COMPARISON:  06/17/2016. FINDINGS: Interval small to moderate-sized bilateral pleural effusions, larger on the right. Bibasilar airspace opacity with an appearance most compatible with atelectasis. Poor inspiration with no gross change in mild prominence of the pulmonary vasculature and interstitial markings. No gross change in mild cardiomegaly. Diffuse osteopenia. Thoracolumbar spine degenerative changes. IMPRESSION: Interval bilateral pleural effusions and bibasilar atelectasis. Underlying pneumonia cannot be excluded. Electronically Signed   By: Claudie Revering M.D.   On: 07/10/2016 22:05     ASSESSMENT AND PLAN:   81 year old male with a history of marginal zone lymphoma and COPD who presents with acute CHF exacerbation.  1. Acute on chronic diastolic heart failure with pEF: Patient had echocardiogram in July which shows preserved ejection fraction however he does have mild LVH and increased pulmonary arterial pressures. Continue IV Lasix. Continue to monitor daily weight and I/O Patient will get cardiac catheterization today as per Dr. Yancey Flemings.  # Bilateral pleural effusions Likely due to CHF. No significant improvement despite good diuresis. We'll check a CT scan of the chest without contrast. Further management as per CT scan results.  # Acute kidney injury over CKD stage III due to diuresis  2. Acute hypoxic respiratory failure There is also evidence of pneumonia. Continue  ZOSYN. MRSA PCR Negative -  discontinued vancomycin  3. Severe hypokalemia due to Diuresis has resolved  4. COPD: Patient does not appear to be in exacerbation at this time. Continue outpatient regimen.  5. Anemia of chronic disease: Hemoglobin remains relatively stable. No need for transfusion at this time.  6. Diabetes: Continue sliding scale insulin and ADA diet.  7.history of marginal zone lymphoma: Patient follows at [facility].  Management plans discussed with the patient and he is in agreement.  CODE STATUS: FULL  TOTAL TIME TAKING CARE OF THIS PATIENT: 30 minutes.   POSSIBLE D/C 2-3 days, DEPENDING ON CLINICAL CONDITION.  Hillary Bow R M.D on 07/12/2016 at 2:28 PM  Between 7am to 6pm - Pager - [phone]  After 6pm go to [email or web address] - password [email or web address] [facility] [facility]  Office  [phone]  CC: Primary care physician; Perrin Maltese, MD  Note: This dictation was prepared with Dragon dictation along with smaller phrase technology. Any transcriptional errors that result from this process are unintentional.

## 2016-07-12 NOTE — Care Management Note (Addendum)
Case Management Note  Patient Details  Name: Brett [name] MRN: [id number] Date of Birth: [birth year]  Subjective/Objective:  Recently at [facility] 07/20-007/24 for COPD exacerbation. Readmitted 08/12 with PNA, acute onset CHF. Referred to CHF clinic. Patient familiar to [facility] from last visit. He lives at home alone but has a friend that is his support system, Brett [name] [phone]).  Followed by [facility] for SN, PT and SW. He has worked in the recent past but has been unable to due to his illnesses. He will be retiring at this point per his friend, Brett [name]. PCP is Brett [name]. Following progression.                 Action/Plan: Resumption of home care at DC.   Expected Discharge Date:  07/13/16               Expected Discharge Plan:  [place]  In-House Referral:     Discharge planning Services  CM Consult  Post Acute Care Choice:  Home Health, Resumption of Svcs/PTA Provider Choice offered to:     DME Arranged:    DME Agency:     HH Arranged:  RN, PT, Social Work [employer] Agency:  [place]  Status of Service:  In process, will continue to follow  If discussed at Long Length of Stay Meetings, dates discussed:    Additional Comments:  Jolly Mango, RN 07/12/2016, 9:32 AM

## 2016-07-12 NOTE — Progress Notes (Signed)
Patient returned to unit from special no change

## 2016-07-12 NOTE — Progress Notes (Signed)
SUBJECTIVE: Patient is still very short of breath   Vitals:   07/12/16 0113 07/12/16 0430 07/12/16 0458 07/12/16 0728  BP:   (!) 121/52   Pulse: 91  95   Resp:   16   Temp:   97.8 F (36.6 C)   TempSrc:   Axillary   SpO2: 95% 91% 100% (!) 88%  Weight:   186 lb 4.8 oz (84.5 kg)   Height:        Intake/Output Summary (Last 24 hours) at 07/12/16 0843 Last data filed at 07/12/16 0700  Gross per 24 hour  Intake              603 ml  Output             2775 ml  Net            -2172 ml    LABS: Basic Metabolic Panel:  Recent Labs  07/11/16 0615 07/11/16 1804 07/12/16 0412  NA 142  --  140  K 2.6* 3.0* 3.6  CL 97*  --  95*  CO2 37*  --  34*  GLUCOSE 150*  --  187*  BUN 25*  --  32*  CREATININE 1.62*  --  2.00*  CALCIUM 7.4*  --  7.4*  MG 1.1* 2.0 1.9  PHOS  --   --  4.6   Liver Function Tests:  Recent Labs  07/10/16 2048  AST 18  ALT 24  ALKPHOS 107  BILITOT 0.3  PROT 7.0  ALBUMIN 3.2*   No results for input(s): LIPASE, AMYLASE in the last 72 hours. CBC:  Recent Labs  07/10/16 2048 07/11/16 0615  WBC 8.0 6.3  NEUTROABS 6.4  --   HGB 9.3* 8.9*  HCT 28.5* 27.2*  MCV 75.6* 75.8*  PLT 256 243   Cardiac Enzymes:  Recent Labs  07/11/16 0041 07/11/16 0615 07/11/16 1145  TROPONINI 0.03* 0.03* 0.03*   BNP: Invalid input(s): POCBNP D-Dimer: No results for input(s): DDIMER in the last 72 hours. Hemoglobin A1C: No results for input(s): HGBA1C in the last 72 hours. Fasting Lipid Panel: No results for input(s): CHOL, HDL, LDLCALC, TRIG, CHOLHDL, LDLDIRECT in the last 72 hours. Thyroid Function Tests:  Recent Labs  07/11/16 1145  TSH 1.599   Anemia Panel: No results for input(s): VITAMINB12, FOLATE, FERRITIN, TIBC, IRON, RETICCTPCT in the last 72 hours.   PHYSICAL EXAM General: Well developed, well nourished, in no acute distress HEENT:  Normocephalic and atramatic Neck:  No JVD.  Lungs: Clear bilaterally to auscultation and  percussion. Heart: HRRR . Normal S1 and S2 without gallops or murmurs.  Abdomen: Bowel sounds are positive, abdomen soft and non-tender  Msk:  Back normal, normal gait. Normal strength and tone for age. Extremities: No clubbing, cyanosis or edema.   Neuro: Alert and oriented X 3. Psych:  Good affect, responds appropriately  TELEMETRY:Sinus rhythm  ASSESSMENT AND PLAN: Patient has congestive heart failure most likely due to diastolic dysfunction and pulmonary hypertension with no significant aortic stenosis. Advise continuation of diuresis. CCTA showed no significant coronary artery disease done recently. Thus catheterization is not needed and creatinine is also elevated. Principal Problem:   Sepsis ([place]) Active Problems:   COPD (chronic obstructive pulmonary disease) ([place])   HCAP (healthcare-associated pneumonia)   HTN (hypertension)   Diabetes (HCC)   CAD (coronary artery disease)   Acute CHF (congestive heart failure) (HCC)    Shonteria Abeln A, MD, [facility] 07/12/2016 8:43 AM

## 2016-07-12 NOTE — Care Management Important Message (Signed)
Important Message  Patient Details  Name: Brett [name] MRN: [id number] Date of Birth: 1935-10-09   Medicare Important Message Given:  Yes    Jolly Mango, RN 07/12/2016, 8:55 AM

## 2016-07-12 NOTE — Progress Notes (Signed)
Pt has been on and off of the non rebreather/CPAP/6L [place] all night, pt has had the non rebreather on since 130 and he wanted to take it off this morning, put him back on the 6L [place] and sats are from 89-90%. Talked to respiratory about him, respiratory mentioned the hi-flow [place]. MD paged to ask if he wanted to put him on that, Dr. Marcille Blanco states that for the moment if the pt is comfortable on the 6L [place], then to keep him on that for right now but to monitor him. Goal is to keep sats 88% and above. Will continue to monitor. Conley Simmonds, RN

## 2016-07-12 NOTE — Progress Notes (Signed)
Pharmacy Consult for Electrolyte Monitoring and Replacement Indication: Hypokalemia, Hypomagnesemia  No Known Allergies  Patient Measurements: Height: 5\' 10"  (177.8 cm) Weight: 186 lb 4.8 oz (84.5 kg) IBW/kg (Calculated) : 73  Vital Signs: Temp: 98 F (36.7 C) (08/14 1436) Temp Source: Oral (08/14 1436) BP: 131/59 (08/14 1436) Pulse Rate: 90 (08/14 1436) Intake/Output from previous day: 08/13 0701 - 08/14 0700 In: 603 [P.O.:400; I.V.:3; IV Piggyback:200] Out: [id number] [Urine:3075] Intake/Output from this shift: No intake/output data recorded.  Labs:  Recent Labs  07/10/16 2048 07/11/16 0615  WBC 8.0 6.3  HGB 9.3* 8.9*  HCT 28.5* 27.2*  PLT 256 243     Recent Labs  07/10/16 2048 07/11/16 0615 07/11/16 1804 07/12/16 0412  NA 140 142  --  140  K 3.2* 2.6* 3.0* 3.6  CL 100* 97*  --  95*  CO2 33* 37*  --  34*  GLUCOSE 201* 150*  --  187*  BUN 31* 25*  --  32*  CREATININE 1.66* 1.62*  --  2.00*  CALCIUM 7.7* 7.4*  --  7.4*  MG  --  1.1* 2.0 1.9  PHOS  --   --   --  4.6  PROT 7.0  --   --   --   ALBUMIN 3.2*  --   --   --   AST 18  --   --   --   ALT 24  --   --   --   ALKPHOS 107  --   --   --   BILITOT 0.3  --   --   --    Estimated Creatinine Clearance: 30.9 mL/min (by C-G formula based on SCr of 2 mg/dL).    Recent Labs  07/12/16 0733 07/12/16 1123 07/12/16 1613  GLUCAP 207* 323* 148*    Medical History: Past Medical History:  Diagnosis Date  . Anemia   . Aortic regurgitation   . 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 Bronchitis   . CAD (coronary artery disease)   . Carotid stenosis   . Diabetes mellitus, type 2 ([place])   . Emphysema of lung ([place])   . Essential hypertension   . History of chemotherapy   . Hyperlipidemia   . IDA (iron deficiency anemia)   . Leg edema   . Meralgia paraesthetica   . Mitral regurgitation   . PUD (peptic ulcer disease)   . PVD (peripheral vascular disease) ([place])   . Tobacco abuse     Medications:   Scheduled:  . antiseptic oral rinse  7 mL Mouth Rinse BID  . [START ON 07/13/2016] aspirin  81 mg Oral Pre-Cath  . aspirin EC  81 mg Oral Daily  . atorvastatin  20 mg Oral Daily  . clopidogrel  75 mg Oral Daily  . diltiazem  180 mg Oral Daily  . enoxaparin (LOVENOX) injection  30 mg Subcutaneous Q24H  . furosemide  40 mg Intravenous BID  . gabapentin  100 mg Oral QHS  . insulin aspart  0-5 Units Subcutaneous QHS  . insulin aspart  0-9 Units Subcutaneous TID WC  . ipratropium-albuterol  3 mL Nebulization Q4H  . living well with diabetes book   Does not apply Once  . metoprolol  200 mg Oral Daily  . mometasone-formoterol  2 puff Inhalation BID  . piperacillin-tazobactam (ZOSYN)  IV  3.375 g Intravenous Q8H  . sodium chloride flush  3 mL Intravenous Q12H  . sodium chloride flush  3 mL Intravenous Q12H  . tiotropium  18 mcg Inhalation Daily    Assessment: Pharmacy consulted to assist in replacing electrolytes in this 81 y/o M admitted with acute CHF.    Plan:   Will recheck electrolyte with am labs.    Pharmacy will continue to monitor and adjust per consult.    Brett [name] 07/12/2016,7:15 PM

## 2016-07-12 NOTE — Progress Notes (Signed)
Patient off the floor to specials for cardia catherization

## 2016-07-13 ENCOUNTER — Encounter
Admission: EM | Disposition: A | Discharge status: Home / Self Care | Payer: Self-pay | Source: Home / Self Care | Attending: Internal Medicine

## 2016-07-13 HISTORY — PX: CARDIAC CATHETERIZATION: SHX172

## 2016-07-13 LAB — GLUCOSE, CAPILLARY
GLUCOSE-CAPILLARY: 173 mg/dL — AB (ref 65–99)
Glucose-Capillary: 181 mg/dL — ABNORMAL HIGH (ref 65–99)
Glucose-Capillary: 213 mg/dL — ABNORMAL HIGH (ref 65–99)
Glucose-Capillary: 145 mg/dL — ABNORMAL HIGH (ref 65–99)

## 2016-07-13 LAB — BASIC METABOLIC PANEL
ANION GAP: 10 (ref 5–15)
BUN: 33 mg/dL — ABNORMAL HIGH (ref 6–20)
CHLORIDE: 95 mmol/L — AB (ref 101–111)
CO2: 37 mmol/L — ABNORMAL HIGH (ref 22–32)
Calcium: 7.4 mg/dL — ABNORMAL LOW (ref 8.9–10.3)
Creatinine, Ser: 2.04 mg/dL — ABNORMAL HIGH (ref 0.61–1.24)
GFR, EST AFRICAN AMERICAN: 34 mL/min — AB (ref >60)
GFR, EST NON AFRICAN AMERICAN: 29 mL/min — AB (ref >60)
Glucose, Bld: 152 mg/dL — ABNORMAL HIGH (ref 65–99)
POTASSIUM: 3.3 mmol/L — AB (ref 3.5–5.1)
SODIUM: 142 mmol/L (ref 135–145)

## 2016-07-13 LAB — MAGNESIUM: Magnesium: 1.8 mg/dL (ref 1.7–2.4)

## 2016-07-13 LAB — HEMOGLOBIN: HEMOGLOBIN: 8.8 g/dL — AB (ref 13.0–18.0)

## 2016-07-13 SURGERY — RIGHT/LEFT HEART CATH AND CORONARY ANGIOGRAPHY
Anesthesia: Moderate Sedation | Laterality: Right

## 2016-07-13 SURGERY — LEFT HEART CATH AND CORONARY ANGIOGRAPHY
Anesthesia: Moderate Sedation | Laterality: Right

## 2016-07-13 MED ORDER — HEPARIN (PORCINE) IN NACL 2-0.9 UNIT/ML-% IJ SOLN
INTRAMUSCULAR | Status: AC
  Filled 2016-07-13: qty 1000

## 2016-07-13 MED ORDER — SODIUM CHLORIDE 0.9 % WEIGHT BASED INFUSION
3.0000 mL/kg/h | INTRAVENOUS | Status: DC
Start: 2016-07-14 — End: 2016-07-13

## 2016-07-13 MED ORDER — SODIUM CHLORIDE 0.9% FLUSH: 3.0000 mL | Freq: Two times a day (BID) | INTRAVENOUS | Status: DC

## 2016-07-13 MED ORDER — FENTANYL CITRATE (PF) 100 MCG/2ML IJ SOLN
INTRAMUSCULAR | Status: AC
  Filled 2016-07-13: qty 2

## 2016-07-13 MED ORDER — SODIUM CHLORIDE 0.9 % IV SOLN: 250.0000 mL | INTRAVENOUS | Status: DC | PRN

## 2016-07-13 MED ORDER — ASPIRIN 81 MG PO CHEW: 81.0000 mg | CHEWABLE_TABLET | ORAL | Status: DC

## 2016-07-13 MED ORDER — SODIUM CHLORIDE 0.9 % WEIGHT BASED INFUSION: 1.0000 mL/kg/h | INTRAVENOUS | Status: DC

## 2016-07-13 MED ORDER — POTASSIUM CHLORIDE CRYS ER 20 MEQ PO TBCR
40.0000 meq | EXTENDED_RELEASE_TABLET | Freq: Once | ORAL | Status: AC
  Administered 2016-07-13: 40 meq via ORAL
  Filled 2016-07-13: qty 2

## 2016-07-13 MED ORDER — MIDAZOLAM HCL 2 MG/2ML IJ SOLN
INTRAMUSCULAR | Status: AC
  Filled 2016-07-13: qty 2

## 2016-07-13 MED ORDER — MIDAZOLAM HCL 2 MG/2ML IJ SOLN
INTRAMUSCULAR | Status: DC | PRN
  Administered 2016-07-13: 1 mg via INTRAVENOUS

## 2016-07-13 MED ORDER — SODIUM CHLORIDE 0.9% FLUSH: 3.0000 mL | INTRAVENOUS | Status: DC | PRN

## 2016-07-13 MED ORDER — IPRATROPIUM-ALBUTEROL 0.5-2.5 (3) MG/3ML IN SOLN
3.0000 mL | Freq: Four times a day (QID) | RESPIRATORY_TRACT | Status: DC
  Administered 2016-07-14 – 2016-07-15 (×6): 3 mL via RESPIRATORY_TRACT
  Filled 2016-07-13 (×6): qty 3

## 2016-07-13 MED ORDER — SODIUM CHLORIDE 0.9 % IV SOLN
INTRAVENOUS | Status: DC
  Administered 2016-07-13: 17:00:00 via INTRAVENOUS

## 2016-07-13 SURGICAL SUPPLY — 12 items
CATH INFINITI 5FR ANG PIGTAIL (CATHETERS) ×3 IMPLANT
CATH INFINITI 5FR JL4 (CATHETERS) ×3 IMPLANT
CATH INFINITI JR4 5F (CATHETERS) ×3 IMPLANT
CATH SWANZ 7F THERMO (CATHETERS) ×3 IMPLANT
DEVICE CLOSURE MYNXGRIP 5F (Vascular Products) ×3 IMPLANT
KIT MANI 3VAL PERCEP (MISCELLANEOUS) ×3 IMPLANT
KIT RIGHT HEART (MISCELLANEOUS) ×3 IMPLANT
NEEDLE PERC 18GX7CM (NEEDLE) ×3 IMPLANT
PACK CARDIAC CATH (CUSTOM PROCEDURE TRAY) ×3 IMPLANT
SHEATH PINNACLE 5F 10CM (SHEATH) ×3 IMPLANT
SHEATH PINNACLE 7F 10CM (SHEATH) ×3 IMPLANT
WIRE EMERALD 3MM-J .035X150CM (WIRE) ×3 IMPLANT

## 2016-07-13 NOTE — Progress Notes (Signed)
Patient changed to [place] 5L from veni mask. Will continue to monitor 02 sats. Report called to floor. Will return patient to floor around 1600.

## 2016-07-13 NOTE — Progress Notes (Signed)
[place] at [place] NAME: Brett [name]    MR#:  [id number]  DATE OF BIRTH:  [birth year]  SUBJECTIVE:   Shortness of breath is better,  on 5 L oxygen.  REVIEW OF SYSTEMS:    Review of Systems  Constitutional: Negative for chills, fever and malaise/fatigue.  HENT: Negative.  Negative for ear discharge, ear pain, hearing loss, nosebleeds and sore throat.   Eyes: Negative.  Negative for blurred vision and pain.  Respiratory: Positive for cough, shortness of breath and wheezing. Negative for hemoptysis.   Cardiovascular: Positive for leg swelling. Negative for chest pain and palpitations.  Gastrointestinal: Negative.  Negative for abdominal pain, blood in stool, diarrhea, nausea and vomiting.  Genitourinary: Negative.  Negative for dysuria.  Musculoskeletal: Negative.  Negative for back pain.  Skin: Negative.   Neurological: Positive for weakness. Negative for dizziness, tremors, speech change, focal weakness, seizures and headaches.  Endo/Heme/Allergies: Negative.  Does not bruise/bleed easily.  Psychiatric/Behavioral: Negative.  Negative for depression, hallucinations and suicidal ideas.   DRUG ALLERGIES:  Not on File  VITALS:  Blood pressure 111/61, pulse 97, temperature 97.7 F (36.5 C), temperature source Oral, resp. rate (!) 24, height 5\' 10"  (1.778 m), weight 179 lb 6.4 oz (81.4 kg), SpO2 90 %.  PHYSICAL EXAMINATION:   Physical Exam  Constitutional: He is oriented to person, place, and time and well-developed, well-nourished, and in no distress. No distress.  HENT:  Head: Normocephalic.  Eyes: No scleral icterus.  Neck: Normal range of motion. Neck supple. No JVD present. No tracheal deviation present.  Cardiovascular: Normal rate and regular rhythm.  Exam reveals no gallop and no friction rub.   Murmur heard. Pulmonary/Chest: Effort normal and breath sounds normal. No respiratory distress. He has no wheezes. He has no rales. He  exhibits no tenderness.  Abdominal: Soft. Bowel sounds are normal. He exhibits distension. He exhibits no mass. There is no tenderness. There is no rebound and no guarding.  Musculoskeletal: Normal range of motion. He exhibits edema.  Neurological: He is alert and oriented to person, place, and time.  Skin: Skin is warm. No rash noted. No erythema.  Psychiatric: Affect and judgment normal.   LABORATORY PANEL:   CBC  Recent Labs Lab 07/11/16 0615 07/13/16 0400  WBC 6.3  --   HGB 8.9* 8.8*  HCT 27.2*  --   PLT 243  --    ------------------------------------------------------------------------------------------------------------------  Chemistries   Recent Labs Lab 07/10/16 2048  07/13/16 0400  NA 140  < > 142  K 3.2*  < > 3.3*  CL 100*  < > 95*  CO2 33*  < > 37*  GLUCOSE 201*  < > 152*  BUN 31*  < > 33*  CREATININE 1.66*  < > 2.04*  CALCIUM 7.7*  < > 7.4*  MG  --   < > 1.8  AST 18  --   --   ALT 24  --   --   ALKPHOS 107  --   --   BILITOT 0.3  --   --   < > = values in this interval not displayed. ------------------------------------------------------------------------------------------------------------------  Cardiac Enzymes  Recent Labs Lab 07/11/16 0041 07/11/16 0615 07/11/16 1145  TROPONINI 0.03* 0.03* 0.03*   ------------------------------------------------------------------------------------------------------------------  RADIOLOGY:  Ct Chest Wo Contrast  Result Date: 07/12/2016 CLINICAL DATA:  Followup pleural effusions. EXAM: CT CHEST WITHOUT CONTRAST TECHNIQUE: Multidetector CT imaging of the chest was performed following the standard protocol without  IV contrast. COMPARISON:  Portable chest obtained yesterday. FINDINGS: Cardiovascular: Aortic and coronary artery calcifications. Mediastinum/Nodes: There are some borderline enlarged mediastinal nodes. These include a left inferior paratracheal node with a short axis diameter of 8 mm on image number 57  and a right paratracheal node with a short axis diameter of 8 mm on image number 46. A small pericardial effusion is demonstrated with a thickness of 9 mm on image number 124. Lungs/Pleura: Mild moderate bilateral bullous changes, most pronounced in the upper lobes. Bilateral lower lobe atelectasis, greater on the right. Small to moderate-sized bilateral pleural effusions. Upper Abdomen: Unremarkable. Musculoskeletal: Thoracic spine degenerative changes. IMPRESSION: 1. Small to moderate-sized bilateral pleural effusions. 2. Bilateral lower lobe atelectasis. 3. COPD with centrilobular emphysema. 4. Small pericardial effusion. 5. Aortic atherosclerosis and coronary artery atherosclerosis. Electronically Signed   By: Claudie Revering M.D.   On: 07/12/2016 14:37     ASSESSMENT AND PLAN:   81 year old male with a history of marginal zone lymphoma and COPD who presents with acute CHF exacerbation.  1. Acute on chronic diastolic heart failure with pEF: Patient had echocardiogram in July which shows preserved ejection fraction however he does have mild LVH and increased pulmonary arterial pressures. Continue IV Lasix. Continue to monitor daily weight and I/O Patient will get cardiac catheterization today as per Dr. [name].  # Bilateral pleural effusions Likely due to CHF. No significant improvement despite good diuresis.  CT scan of the chest without contrast: Small to moderate-sized bilateral pleural effusions.  # Acute kidney injury over CKD stage III due to diuresis, f/u BMP.  2. Acute hypoxic respiratory failure There is also evidence of pneumonia. Continue ZOSYN. MRSA PCR Negative -  discontinued vancomycin  3. Severe hypokalemia due to Diuresis, given KCl.  4. COPD: Patient does not appear to be in exacerbation at this time. Continue outpatient regimen.  5. Anemia of chronic disease: Hemoglobin remains relatively stable. No need for transfusion at this time.  6. Diabetes: Continue sliding scale  insulin and ADA diet.  7.history of marginal zone lymphoma: Patient follows at [facility].  Management plans discussed with the patient and he is in agreement.  CODE STATUS: FULL  TOTAL TIME TAKING CARE OF THIS PATIENT: 41 minutes.   POSSIBLE D/C 2-3 days, DEPENDING ON CLINICAL CONDITION.  Demetrios Loll M.D on 07/13/2016 at 2:58 PM  Between 7am to 6pm - Pager - [phone]  After 6pm go to [email or web address] - password [email or web address] [place] [facility]  Office  [phone]  CC: Primary care physician; Perrin Maltese, MD  Note: This dictation was prepared with Dragon dictation along with smaller phrase technology. Any transcriptional errors that result from this process are unintentional.

## 2016-07-13 NOTE — Progress Notes (Signed)
Pt returned from cath lab, R groin site intact, soft, no evidence of hematoma, bleeding or ecchymosis. No reports of pain, VSS on 4L [place]. Pt up to bathroom without difficulty. Will continue to monitor.

## 2016-07-13 NOTE — Progress Notes (Signed)
MD made aware by staff that patient's 02 sats have been low since coming to specials area (85 to 90% on 5L). Dr. [name] reassessed patient and states that he will do procedure as scheduled.

## 2016-07-13 NOTE — Progress Notes (Signed)
Placed pt on Cpap for QHS, pt immediately started pulling at the mask and stated he could not wear. Placed pt back in 6L [place], informed RN and will cont. To monitor.

## 2016-07-13 NOTE — Progress Notes (Signed)
SUBJECTIVE: Patient denies any chest pain but feels less short of breath compared to yesterday.   Vitals:   07/13/16 0010 07/13/16 0419 07/13/16 0423 07/13/16 0745  BP:  (!) 117/54    Pulse:  94    Resp:  18    Temp:  98.3 F (36.8 C)    TempSrc:  Oral    SpO2: 92% 91% 91% 90%  Weight:  179 lb 6.4 oz (81.4 kg)    Height:        Intake/Output Summary (Last 24 hours) at 07/13/16 0853 Last data filed at 07/13/16 0815  Gross per 24 hour  Intake              563 ml  Output             1426 ml  Net             -863 ml    LABS: Basic Metabolic Panel:  Recent Labs  07/11/16 1804 07/12/16 0412 07/13/16 0400  NA  --  140 142  K 3.0* 3.6 3.3*  CL  --  95* 95*  CO2  --  34* 37*  GLUCOSE  --  187* 152*  BUN  --  32* 33*  CREATININE  --  2.00* 2.04*  CALCIUM  --  7.4* 7.4*  MG 2.0 1.9  --   PHOS  --  4.6  --    Liver Function Tests:  Recent Labs  07/10/16 2048  AST 18  ALT 24  ALKPHOS 107  BILITOT 0.3  PROT 7.0  ALBUMIN 3.2*   No results for input(s): LIPASE, AMYLASE in the last 72 hours. CBC:  Recent Labs  07/10/16 2048 07/11/16 0615 07/13/16 0400  WBC 8.0 6.3  --   NEUTROABS 6.4  --   --   HGB 9.3* 8.9* 8.8*  HCT 28.5* 27.2*  --   MCV 75.6* 75.8*  --   PLT 256 243  --    Cardiac Enzymes:  Recent Labs  07/11/16 0041 07/11/16 0615 07/11/16 1145  TROPONINI 0.03* 0.03* 0.03*   BNP: Invalid input(s): POCBNP D-Dimer: No results for input(s): DDIMER in the last 72 hours. Hemoglobin A1C: No results for input(s): HGBA1C in the last 72 hours. Fasting Lipid Panel: No results for input(s): CHOL, HDL, LDLCALC, TRIG, CHOLHDL, LDLDIRECT in the last 72 hours. Thyroid Function Tests:  Recent Labs  07/11/16 1145  TSH 1.599   Anemia Panel: No results for input(s): VITAMINB12, FOLATE, FERRITIN, TIBC, IRON, RETICCTPCT in the last 72 hours.   PHYSICAL EXAM General: Well developed, well nourished, in no acute distress HEENT:  Normocephalic and  atramatic Neck:  No JVD.  Lungs: Clear bilaterally to auscultation and percussion. Heart: HRRR . Normal S1 and S2 without gallops or murmurs.  Abdomen: Bowel sounds are positive, abdomen soft and non-tender  Msk:  Back normal, normal gait. Normal strength and tone for age. Extremities: No clubbing, cyanosis or edema.   Neuro: Alert and oriented X 3. Psych:  Good affect, responds appropriately  TELEMETRY:Sinus rhythm  ASSESSMENT AND PLAN: Congestive heart failure with pulmonary hypertension and mild aortic stenosis and diastolic dysfunction but normal systolic function. Patient does have renal insufficiency but ccta showed severe calcification of the LAD and moderate disease in left circumflex and RCA. Because of the severe calcification patient may still have significant LAD disease thus catheterization will be done without LV gram. Also will do a right heart catheter because patient appears to have pulmonary hypertension on  echocardiogram. Patient was on 6 L nasal cannula and I was unable to lie down yesterday thus catheterization had to be postponed for today.  Principal Problem:   Sepsis ([place]) Active Problems:   COPD (chronic obstructive pulmonary disease) ([place])   HCAP (healthcare-associated pneumonia)   HTN (hypertension)   Diabetes (HCC)   CAD (coronary artery disease)   Acute CHF (congestive heart failure) (HCC)    Fenna Semel A, MD, [facility] 07/13/2016 8:53 AM

## 2016-07-13 NOTE — Progress Notes (Signed)
PT Cancellation Note  Patient Details Name: Brett [name] MRN: [id number] DOB: 14-Nov-1935   Cancelled Treatment:    Reason Eval/Treat Not Completed: Patient at procedure or test/unavailable. Treatment attempted; pt unavailable at this time. Re attempt at a later time/date.    Charlaine Dalton, [place] 07/13/2016, 2:17 PM

## 2016-07-13 NOTE — Progress Notes (Signed)
Mild CAD with severe calcification in LAD/LCX/RCA without any obstrctive disease. LV gram deffered, asEF on echo was normal.

## 2016-07-14 ENCOUNTER — Encounter: Payer: Self-pay | Admitting: Cardiovascular Disease

## 2016-07-14 ENCOUNTER — Inpatient Hospital Stay: Payer: Medicare PPO

## 2016-07-14 LAB — GLUCOSE, CAPILLARY
GLUCOSE-CAPILLARY: 152 mg/dL — AB (ref 65–99)
GLUCOSE-CAPILLARY: 319 mg/dL — AB (ref 65–99)
GLUCOSE-CAPILLARY: 219 mg/dL — AB (ref 65–99)
Glucose-Capillary: 138 mg/dL — ABNORMAL HIGH (ref 65–99)
Glucose-Capillary: 132 mg/dL — ABNORMAL HIGH (ref 65–99)

## 2016-07-14 LAB — BASIC METABOLIC PANEL
ANION GAP: 12 (ref 5–15)
BUN: 36 mg/dL — ABNORMAL HIGH (ref 6–20)
CHLORIDE: 95 mmol/L — AB (ref 101–111)
CO2: 34 mmol/L — AB (ref 22–32)
CREATININE: 2.01 mg/dL — AB (ref 0.61–1.24)
Calcium: 7.5 mg/dL — ABNORMAL LOW (ref 8.9–10.3)
GFR calc Af Amer: 35 mL/min — ABNORMAL LOW (ref >60)
GFR calc non Af Amer: 30 mL/min — ABNORMAL LOW (ref >60)
Glucose, Bld: 154 mg/dL — ABNORMAL HIGH (ref 65–99)
POTASSIUM: 3.5 mmol/L (ref 3.5–5.1)
SODIUM: 141 mmol/L (ref 135–145)

## 2016-07-14 MED ORDER — LEVOFLOXACIN IN D5W 500 MG/100ML IV SOLN
500.0000 mg | Freq: Once | INTRAVENOUS | Status: AC
  Administered 2016-07-14: 500 mg via INTRAVENOUS
  Filled 2016-07-14: qty 100

## 2016-07-14 MED ORDER — INSULIN ASPART 100 UNIT/ML ~~LOC~~ SOLN
0.0000 [IU] | Freq: Every day | SUBCUTANEOUS | Status: DC
  Administered 2016-07-14: 2 [IU] via SUBCUTANEOUS
  Filled 2016-07-14: qty 2

## 2016-07-14 MED ORDER — POTASSIUM CHLORIDE CRYS ER 20 MEQ PO TBCR
20.0000 meq | EXTENDED_RELEASE_TABLET | Freq: Once | ORAL | Status: AC
  Administered 2016-07-14: 20 meq via ORAL
  Filled 2016-07-14: qty 1

## 2016-07-14 MED ORDER — LEVOFLOXACIN IN D5W 250 MG/50ML IV SOLN
250.0000 mg | INTRAVENOUS | Status: DC
  Filled 2016-07-14: qty 50

## 2016-07-14 MED ORDER — INSULIN ASPART 100 UNIT/ML ~~LOC~~ SOLN
0.0000 [IU] | Freq: Three times a day (TID) | SUBCUTANEOUS | Status: DC
Start: 2016-07-14 — End: 2016-07-15
  Administered 2016-07-14: 2 [IU] via SUBCUTANEOUS
  Administered 2016-07-15: 3 [IU] via SUBCUTANEOUS
  Filled 2016-07-14: qty 8
  Filled 2016-07-14: qty 3
  Filled 2016-07-14: qty 2

## 2016-07-14 NOTE — Discharge Instructions (Signed)
Heart Failure Clinic appointment on August 06, 2016 at 11:30am with Darylene Price, [place]. Please call [phone] to reschedule.

## 2016-07-14 NOTE — Progress Notes (Signed)
SUBJECTIVE: The patient is feeling well this morning with no chest pain. Breathing is better and patient is still on 4 L of oxygen. Pedal edema has improved.   Vitals:   07/13/16 1950 07/14/16 0230 07/14/16 0556 07/14/16 0815  BP: (!) 129/50  (!) 122/55 131/63  Pulse: 97  90 100  Resp: 15  14   Temp: 97.7 F (36.5 C)  97.7 F (36.5 C)   TempSrc: Oral     SpO2: 92% 91% 95% 93%  Weight:   176 lb 8 oz (80.1 kg)   Height:        Intake/Output Summary (Last 24 hours) at 07/14/16 0828 Last data filed at 07/14/16 [id number]  Gross per 24 hour  Intake              303 ml  Output             1700 ml  Net            -1397 ml    LABS: Basic Metabolic Panel:  Recent Labs  07/12/16 0412 07/13/16 0400 07/14/16 0405  NA 140 142 141  K 3.6 3.3* 3.5  CL 95* 95* 95*  CO2 34* 37* 34*  GLUCOSE 187* 152* 154*  BUN 32* 33* 36*  CREATININE 2.00* 2.04* 2.01*  CALCIUM 7.4* 7.4* 7.5*  MG 1.9 1.8  --   PHOS 4.6  --   --    Liver Function Tests: No results for input(s): AST, ALT, ALKPHOS, BILITOT, PROT, ALBUMIN in the last 72 hours. No results for input(s): LIPASE, AMYLASE in the last 72 hours. CBC:  Recent Labs  07/13/16 0400  HGB 8.8*   Cardiac Enzymes:  Recent Labs  07/11/16 1145  TROPONINI 0.03*   BNP: Invalid input(s): POCBNP D-Dimer: No results for input(s): DDIMER in the last 72 hours. Hemoglobin A1C: No results for input(s): HGBA1C in the last 72 hours. Fasting Lipid Panel: No results for input(s): CHOL, HDL, LDLCALC, TRIG, CHOLHDL, LDLDIRECT in the last 72 hours. Thyroid Function Tests:  Recent Labs  07/11/16 1145  TSH 1.599   Anemia Panel: No results for input(s): VITAMINB12, FOLATE, FERRITIN, TIBC, IRON, RETICCTPCT in the last 72 hours.   PHYSICAL EXAM General: Well developed, well nourished, in no acute distress HEENT:  Normocephalic and atramatic Neck:  No JVD.  Lungs: Clear bilaterally to auscultation and percussion. Heart: HRRR . Normal S1 and S2  without gallops or murmurs.  Abdomen: Bowel sounds are positive, abdomen soft and non-tender  Msk:  Back normal, normal gait. Normal strength and tone for age. Extremities: No clubbing, cyanosis or edema.   Neuro: Alert and oriented X 3. Psych:  Good affect, responds appropriately  TELEMETRY: Sinus rhythm  ASSESSMENT AND PLAN: Congestive heart failure with pulmonary hypertension mild aortic stenosis and diastolic dysfunction but normal systolic function. A cardiac catheterization was done yesterday by Dr. [name] which revealed Mild CAD with severe calcification in LAD/LCX/RCA without any obstrctive disease. LV gram deffered, as EF on echo was normal. The patient likely has a combination of diastolic heart failure and COPD complicated by pneumonia. Patient has been diuresing well and slowly improving. Still requiring 4 L of oxygen and hospitalist is managing.  Principal Problem:   Sepsis ([place]) Active Problems:   COPD (chronic obstructive pulmonary disease) ([place])   HCAP (healthcare-associated pneumonia)   HTN (hypertension)   Diabetes ([place])   CAD (coronary artery disease)   Acute CHF (congestive heart failure) (HCC)    Daune Perch, NP  07/14/2016 8:28 AM

## 2016-07-14 NOTE — Progress Notes (Signed)
Physical Therapy Treatment Patient Details Name: Brett [name] MRN: [id number] DOB: 13-Dec-1934 Today's Date: 07/14/2016    History of Present Illness  81 y.o. male who presents with 3 days of progressive shortness of breath as well as some left-sided chest pain. Patient states he has had some chills at home as well. Admitted with sepsis - possible pneumonia. Patient has significant lower extremity edema as well as some abdominal distention. PMH significant for emphysema, aortic regurgitation, PVD, DM, HLD and h/o B cell lymphoma.      PT Comments    Pt agreeable to PT, but only bed exercises this morn; pt notes he was up in the chair earlier and recently returned to bed. Pt now feels fatigued. Pt progresses bed exercises well today tolerating resistance with several exercises, increasing repetitions with straight leg raise without assist and initiating new exercises. Pt without complaints post session. Heart rate and O2 saturation maintain within initial readings throughout session. Continue Pt to progress strength and endurance and improve functional mobility to allow an optimal, safe return home. Pt does need to demonstrated continuity with ambulation before discharge.   Follow Up Recommendations  Home health PT     Equipment Recommendations  None recommended by PT    Recommendations for Other Services       Precautions / Restrictions Restrictions Weight Bearing Restrictions: No    Mobility  Bed Mobility               General bed mobility comments: Not tested; recently returned to bed; fatigued  Transfers                    Ambulation/Gait                 Stairs            Wheelchair Mobility    Modified Rankin (Stroke Patients Only)       Balance                                    Cognition Arousal/Alertness: Awake/alert Behavior During Therapy: WFL for tasks assessed/performed Overall Cognitive Status: Within  Functional Limits for tasks assessed                      Exercises General Exercises - Lower Extremity Ankle Circles/Pumps: AROM;Both;20 reps;Supine Quad Sets: Strengthening;Both;20 reps;Supine Gluteal Sets: Strengthening;Both;20 reps;Supine Short Arc Quad: AROM;Both;20 reps;Supine Heel Slides: Strengthening;Both;20 reps;Supine Hip ABduction/ADduction: Strengthening;Both;20 reps;Supine Straight Leg Raises: Strengthening;Both;20 reps;Supine Other Exercises Other Exercises: Ham sets, B 20x supine Other Exercises: Bridge 20x supine    General Comments        Pertinent Vitals/Pain Pain Assessment: No/denies pain    Home Living                      Prior Function            PT Goals (current goals can now be found in the care plan section)      Frequency  Min 2X/week    PT Plan Current plan remains appropriate    Co-evaluation             End of Session   Activity Tolerance: Patient tolerated treatment well;Patient limited by fatigue (fatigue reason pt did not want out of bed, up earlier ) Patient left: in bed;with call bell/phone within reach;with bed alarm set  Time: [id number] PT Time Calculation (min) (ACUTE ONLY): 23 min  Charges:  $Therapeutic Exercise: 23-37 mins                    G Codes:      Charlaine Dalton, PTA 07/14/2016, 12:07 PM

## 2016-07-14 NOTE — Progress Notes (Signed)
Patients CBG increased to 319 from this AM (it was 152 this AM). Pt not on steroids and reports he didn't consume anything other than his breakfast tray this AM. Sent text page to Dr. Bridgett Larsson to let him know.

## 2016-07-14 NOTE — Progress Notes (Signed)
[facility] [place] NAME: Brett [name]    MR#:  [id number]  DATE OF BIRTH:  [birth year]  SUBJECTIVE:   Shortness of breath is better,  on 4 L oxygen.  REVIEW OF SYSTEMS:    Review of Systems  Constitutional: Negative for chills, fever and malaise/fatigue.  HENT: Negative.  Negative for ear discharge, ear pain, hearing loss, nosebleeds and sore throat.   Eyes: Negative.  Negative for blurred vision and pain.  Respiratory: Positive for cough and shortness of breath. Negative for hemoptysis and wheezing.   Cardiovascular: Positive for leg swelling. Negative for chest pain and palpitations.  Gastrointestinal: Negative.  Negative for abdominal pain, blood in stool, diarrhea, nausea and vomiting.  Genitourinary: Negative.  Negative for dysuria.  Musculoskeletal: Negative.  Negative for back pain.  Skin: Negative.   Neurological: Positive for weakness. Negative for dizziness, tremors, speech change, focal weakness, seizures and headaches.  Endo/Heme/Allergies: Negative.  Does not bruise/bleed easily.  Psychiatric/Behavioral: Negative.  Negative for depression, hallucinations and suicidal ideas.   DRUG ALLERGIES:  Not on File  VITALS:  Blood pressure 124/60, pulse 99, temperature 98.1 F (36.7 C), temperature source Oral, resp. rate 20, height 5\' 10"  (1.778 m), weight 176 lb 8 oz (80.1 kg), SpO2 91 %.  PHYSICAL EXAMINATION:   Physical Exam  Constitutional: He is oriented to person, place, and time and well-developed, well-nourished, and in no distress. No distress.  HENT:  Head: Normocephalic.  Eyes: No scleral icterus.  Neck: Normal range of motion. Neck supple. No JVD present. No tracheal deviation present.  Cardiovascular: Normal rate and regular rhythm.  Exam reveals no gallop and no friction rub.   Murmur heard. Pulmonary/Chest: Effort normal and breath sounds normal. No respiratory distress. He has no wheezes. He has no rales. He  exhibits no tenderness.  Abdominal: Soft. Bowel sounds are normal. He exhibits no distension and no mass. There is no tenderness. There is no rebound and no guarding.  Musculoskeletal: Normal range of motion. He exhibits edema.  Neurological: He is alert and oriented to person, place, and time.  Skin: Skin is warm. No rash noted. No erythema.  Psychiatric: Affect and judgment normal.   LABORATORY PANEL:   CBC  Recent Labs Lab 07/11/16 0615 07/13/16 0400  WBC 6.3  --   HGB 8.9* 8.8*  HCT 27.2*  --   PLT 243  --    ------------------------------------------------------------------------------------------------------------------  Chemistries   Recent Labs Lab 07/10/16 2048  07/13/16 0400 07/14/16 0405  NA 140  < > 142 141  K 3.2*  < > 3.3* 3.5  CL 100*  < > 95* 95*  CO2 33*  < > 37* 34*  GLUCOSE 201*  < > 152* 154*  BUN 31*  < > 33* 36*  CREATININE 1.66*  < > 2.04* 2.01*  CALCIUM 7.7*  < > 7.4* 7.5*  MG  --   < > 1.8  --   AST 18  --   --   --   ALT 24  --   --   --   ALKPHOS 107  --   --   --   BILITOT 0.3  --   --   --   < > = values in this interval not displayed. ------------------------------------------------------------------------------------------------------------------  Cardiac Enzymes  Recent Labs Lab 07/11/16 0041 07/11/16 0615 07/11/16 1145  TROPONINI 0.03* 0.03* 0.03*   ------------------------------------------------------------------------------------------------------------------  RADIOLOGY:  Dg Chest 2 View  Result Date: 07/14/2016  CLINICAL DATA:  Pleural effusion. EXAM: CHEST  2 VIEW COMPARISON:  CT 07/12/2016.  Chest x-ray 07/11/2016. FINDINGS: Cardiomegaly pulmonary vascular prominence and bilateral interstitial prominence consistent congestive heart failure. Bilateral pleural effusions. No pneumothorax. IMPRESSION: Congestive heart failure with bilateral interstitial edema and bilateral pleural effusions. Electronically Signed   By:  [name]  Register   On: 07/14/2016 14:40     ASSESSMENT AND PLAN:   81 year old male with a history of marginal zone lymphoma and COPD who presents with acute CHF exacerbation.  1. Acute on chronic diastolic heart failure with pEF: Patient had echocardiogram in July which shows preserved ejection fraction however he does have mild LVH and increased pulmonary arterial pressures. Continue IV Lasix. Continue to monitor daily weight and I/O Mild CAD with severe calcification in LAD/LCX/RCA without any obstrctive disease per cardiac catheterization by Dr. [name].  # Bilateral pleural effusions Likely due to CHF.   CT scan of the chest without contrast: Small to moderate-sized bilateral pleural effusions. Repeat CXR in am.  # Acute kidney injury over CKD stage III due to diuresis, f/u BMP.  2. Acute hypoxic respiratory failure There is also evidence of pneumonia. disContinue ZOSYN. Change to po levaquin. MRSA PCR Negative -  discontinued vancomycin  3. Severe hypokalemia due to Diuresis, given KCl, improved.  4. COPD: Patient does not appear to be in exacerbation at this time. Continue outpatient regimen.  5. Anemia of chronic disease: Hemoglobin remains relatively stable. No need for transfusion at this time.  6. Diabetes: Continue sliding scale insulin and ADA diet.  7.history of marginal zone lymphoma: Patient follows at [facility]  Management plans discussed with the patient and he is in agreement.  CODE STATUS: FULL  TOTAL TIME TAKING CARE OF THIS PATIENT: 37 minutes.   POSSIBLE D/C 2 days, DEPENDING ON CLINICAL CONDITION.  Demetrios Loll M.D on 07/14/2016 at 3:56 PM  Between 7am to 6pm - Pager - [phone]  After 6pm go to [email or web address] - password [email or web address] [place] [facility]  Office  [phone]  CC: Primary care physician; Perrin Maltese, MD  Note: This dictation was prepared with Dragon dictation along with smaller phrase technology. Any transcriptional errors  that result from this process are unintentional.

## 2016-07-15 LAB — BASIC METABOLIC PANEL
ANION GAP: 8 (ref 5–15)
BUN: 37 mg/dL — ABNORMAL HIGH (ref 6–20)
CALCIUM: 7.4 mg/dL — AB (ref 8.9–10.3)
CO2: 36 mmol/L — AB (ref 22–32)
Chloride: 95 mmol/L — ABNORMAL LOW (ref 101–111)
Creatinine, Ser: 1.78 mg/dL — ABNORMAL HIGH (ref 0.61–1.24)
GFR, EST AFRICAN AMERICAN: 40 mL/min — AB (ref >60)
GFR, EST NON AFRICAN AMERICAN: 35 mL/min — AB (ref >60)
GLUCOSE: 258 mg/dL — AB (ref 65–99)
POTASSIUM: 3.3 mmol/L — AB (ref 3.5–5.1)
Sodium: 139 mmol/L (ref 135–145)

## 2016-07-15 LAB — CULTURE, BLOOD (ROUTINE X 2)
CULTURE: NO GROWTH
CULTURE: NO GROWTH

## 2016-07-15 LAB — GLUCOSE, CAPILLARY
GLUCOSE-CAPILLARY: 186 mg/dL — AB (ref 65–99)
GLUCOSE-CAPILLARY: 279 mg/dL — AB (ref 65–99)

## 2016-07-15 LAB — MAGNESIUM: Magnesium: 1.8 mg/dL (ref 1.7–2.4)

## 2016-07-15 MED ORDER — LEVOFLOXACIN 500 MG PO TABS: 500.0000 mg | ORAL_TABLET | Freq: Every day | ORAL | 0 refills | Status: DC

## 2016-07-15 MED ORDER — POTASSIUM CHLORIDE 20 MEQ PO PACK
40.0000 meq | PACK | ORAL | Status: DC
  Administered 2016-07-15: 40 meq via ORAL
  Filled 2016-07-15: qty 2

## 2016-07-15 MED ORDER — MAGNESIUM SULFATE 2 GM/50ML IV SOLN
2.0000 g | Freq: Once | INTRAVENOUS | Status: AC
  Administered 2016-07-15: 2 g via INTRAVENOUS
  Filled 2016-07-15: qty 50

## 2016-07-15 NOTE — Care Management Note (Signed)
Case Management Note  Patient Details  Name: Brett [name] MRN: [id number] Date of Birth: Sep 16, 1935  Subjective/Objective:                    Action/Plan:   Expected Discharge Date:   07/15/2016              Expected Discharge Plan:  [place]  In-House Referral:     Discharge planning Services  CM Consult  Post Acute Care Choice:  Home Health, Resumption of Svcs/PTA Provider Choice offered to:     DME Arranged:    DME Agency:     HH Arranged:  RN, PT, Social Work [employer] Agency:  [place]  Status of Service:  In process, will continue to follow  If discussed at Long Length of Stay Meetings, dates discussed:    Additional Comments:  Jolly Mango, RN 07/15/2016, 11:15 AM

## 2016-07-15 NOTE — Progress Notes (Signed)
SUBJECTIVE: No chest pain no shortness of breath   Vitals:   07/14/16 1408 07/14/16 2009 07/14/16 2033 07/15/16 0419  BP:  (!) 118/54  (!) 113/59  Pulse:  92  87  Resp:  14  18  Temp:  97.9 F (36.6 C)  97.7 F (36.5 C)  TempSrc:  Oral  Oral  SpO2: 91% 94% 93% 92%  Weight:    176 lb 4.8 oz (80 kg)  Height:        Intake/Output Summary (Last 24 hours) at 07/15/16 0831 Last data filed at 07/15/16 0617  Gross per 24 hour  Intake              393 ml  Output             1725 ml  Net            -1332 ml    LABS: Basic Metabolic Panel:  Recent Labs  07/13/16 0400 07/14/16 0405 07/15/16 0339  NA 142 141 139  K 3.3* 3.5 3.3*  CL 95* 95* 95*  CO2 37* 34* 36*  GLUCOSE 152* 154* 258*  BUN 33* 36* 37*  CREATININE 2.04* 2.01* 1.78*  CALCIUM 7.4* 7.5* 7.4*  MG 1.8  --  1.8   Liver Function Tests: No results for input(s): AST, ALT, ALKPHOS, BILITOT, PROT, ALBUMIN in the last 72 hours. No results for input(s): LIPASE, AMYLASE in the last 72 hours. CBC:  Recent Labs  07/13/16 0400  HGB 8.8*   Cardiac Enzymes: No results for input(s): CKTOTAL, CKMB, CKMBINDEX, TROPONINI in the last 72 hours. BNP: Invalid input(s): POCBNP D-Dimer: No results for input(s): DDIMER in the last 72 hours. Hemoglobin A1C: No results for input(s): HGBA1C in the last 72 hours. Fasting Lipid Panel: No results for input(s): CHOL, HDL, LDLCALC, TRIG, CHOLHDL, LDLDIRECT in the last 72 hours. Thyroid Function Tests: No results for input(s): TSH, T4TOTAL, T3FREE, THYROIDAB in the last 72 hours.  Invalid input(s): FREET3 Anemia Panel: No results for input(s): VITAMINB12, FOLATE, FERRITIN, TIBC, IRON, RETICCTPCT in the last 72 hours.   PHYSICAL EXAM General: Well developed, well nourished, in no acute distress HEENT:  Normocephalic and atramatic Neck:  No JVD.  Lungs: Clear bilaterally to auscultation and percussion. Heart: HRRR . Normal S1 and S2 without gallops or murmurs.  Abdomen: Bowel  sounds are positive, abdomen soft and non-tender  Msk:  Back normal, normal gait. Normal strength and tone for age. Extremities: No clubbing, cyanosis or edema.   Neuro: Alert and oriented X 3. Psych:  Good affect, responds appropriately  TELEMETRY: Sinus rhythm  ASSESSMENT AND PLAN: Mild coronary artery disease with COPD exacerbation and diastolic dysfunction heart failure. Patient has gradually improved and may be discharged with follow-up in the office next Tuesday at 10 AM.  Principal Problem:   Sepsis ([place]) Active Problems:   COPD (chronic obstructive pulmonary disease) ([place])   HCAP (healthcare-associated pneumonia)   HTN (hypertension)   Diabetes ([place])   CAD (coronary artery disease)   Acute CHF (congestive heart failure) ([place])    Mathew Postiglione A, MD, [facility] 07/15/2016 8:31 AM

## 2016-07-15 NOTE — Progress Notes (Signed)
Inpatient Diabetes Program Recommendations  AACE/ADA: New Consensus Statement on Inpatient Glycemic Control (2015)  Target Ranges:  Prepandial:   less than 140 mg/dL      Peak postprandial:   less than 180 mg/dL (1-2 hours)      Critically ill patients:  140 - 180 mg/dL   Lab Results  Component Value Date   GLUCAP 186 (H) 07/15/2016   HGBA1C 7.7 (H) 06/20/2016    Review of Glycemic Control  Results for Brett [name], Brett [name] (MRN [id number]) as of 07/15/2016 09:55  Ref. Range 07/14/2016 11:20 07/14/2016 16:09 07/14/2016 18:16 07/14/2016 21:00 07/15/2016 07:34  Glucose-Capillary Latest Ref Range: 65 - 99 mg/dL 319 (H) 138 (H) 132 (H) 219 (H) 186 (H)    Diabetes history: Type 2 Outpatient Diabetes medications: none noted Current orders for Inpatient glycemic control: Novolog moderate correction scale 0-15 units tid, Novolog 0-5 units qhs  Inpatient Diabetes Program Recommendations:   Please consider ordering low dose basal insulin. Recommend starting with Lantus 8 units Q24H starting now (based on 80 kg x 0.1 units).  Outpatient DM medications: If patient will be prescribed DM medication at time of discharge, please ensure patient has a prescription for medications along with instructions to take medication as prescribed and follow up with PCP.  Gentry Fitz, RN, BA, MHA, CDE Diabetes Coordinator Inpatient Diabetes Program  [phone] (Team Pager) [phone] ([place]) 07/15/2016 10:00 AM

## 2016-07-15 NOTE — Care Management (Signed)
Patient discharging home with [place].

## 2016-07-15 NOTE — Discharge Summary (Signed)
[facility] [place] NAME: Brett [name]    MR#:  [id number]  DATE OF BIRTH:  05-30-1935  DATE OF ADMISSION:  07/10/2016 ADMITTING PHYSICIAN: Lance Coon, MD  DATE OF DISCHARGE: 07/15/16  PRIMARY CARE PHYSICIAN: Perrin Maltese, MD    ADMISSION DIAGNOSIS:  SOB (shortness of breath) [R06.02] HCAP (healthcare-associated pneumonia) [J18.9] Chest pain, unspecified chest pain type [R07.9] Acute on chronic congestive heart failure, unspecified congestive heart failure type ([place]) [I50.9]  DISCHARGE DIAGNOSIS:  Principal Problem:   Sepsis ([place])  acute on chronic hypoxic respiratory failure HCAP (healthcare-associated pneumonia)   Acute on chronic diastolic CHF (congestive heart failure) (HCC) Additional diagnosis   COPD (chronic obstructive pulmonary disease) (HCC)   HTN (hypertension)   Diabetes (HCC)   CAD (coronary artery disease)   Acute on chronic diastolic CHF (congestive heart failure) ([place])   SECONDARY DIAGNOSIS:   Past Medical History:  Diagnosis Date  . Anemia   . Aortic regurgitation   . B-cell lymphoma ([place]) 2009   DX AT [facility]  . Bronchitis   . CAD (coronary artery disease)   . Carotid stenosis   . Diabetes mellitus, type 2 ([place])   . Emphysema of lung ([place])   . Essential hypertension   . History of chemotherapy   . Hyperlipidemia   . IDA (iron deficiency anemia)   . Leg edema   . Meralgia paraesthetica   . Mitral regurgitation   . PUD (peptic ulcer disease)   . PVD (peripheral vascular disease) ([place])   . Tobacco abuse     HOSPITAL COURSE:  Brett [name]  is a 81 y.o. male admitted 07/10/2016 with chief complaint Respiratory Distress and Abdominal Pain (upper left quadrant) . Please see H&P performed by Lance Coon, MD for further information.Patient presented with the above symptoms, found to have evidence of pneumonia started on antibiotics with initial improvement also noted to have volume overload.  Placed on IV diuresis with overall improvement of symptoms. Patient also evaluated by cardiology during his stay in the hospital who assisted in the diuresis process. He is now back to baseline oxygen requirements to follow with cardiology as an outpatient.  DISCHARGE CONDITIONS:   Stable  CONSULTS OBTAINED:  Treatment Team:  Dionisio David, MD  DRUG ALLERGIES:  Not on File  DISCHARGE MEDICATIONS:   Current Discharge Medication List    START taking these medications   Details  levofloxacin (LEVAQUIN) 500 MG tablet Take 1 tablet (500 mg total) by mouth daily. Qty: 4 tablet, Refills: 0      CONTINUE these medications which have NOT CHANGED   Details  amLODipine (NORVASC) 5 MG tablet Take 5 mg by mouth daily.     aspirin (ADULT ASPIRIN EC LOW STRENGTH) 81 MG EC tablet Take 1 tablet by mouth daily.    atorvastatin (LIPITOR) 20 MG tablet Take 20 mg by mouth daily.    clopidogrel (PLAVIX) 75 MG tablet Take 75 mg by mouth daily.     diltiazem (CARDIZEM CD) 180 MG 24 hr capsule Take 1 capsule (180 mg total) by mouth daily. Qty: 30 capsule, Refills: 0    Fluticasone-Salmeterol (ADVAIR DISKUS) 250-50 MCG/DOSE AEPB Inhale 1 puff into the lungs 2 times daily at 12 noon and 4 pm. Qty: 1 each, Refills: 0    furosemide (LASIX) 40 MG tablet Take 1 tablet by mouth daily.    gabapentin (NEURONTIN) 100 MG capsule Take 1 capsule by mouth at bedtime.  hydrALAZINE (APRESOLINE) 25 MG tablet Take 2 tablets (50 mg total) by mouth 3 (three) times daily. Qty: 90 tablet, Refills: 0    hydrochlorothiazide (HYDRODIURIL) 25 MG tablet Take 25 mg by mouth daily. Refills: 3    metoprolol (TOPROL-XL) 200 MG 24 hr tablet Take 1 tablet by mouth daily.    senna-docusate (SENOKOT-S) 8.6-50 MG tablet Take 1 tablet by mouth 2 (two) times daily.    tiotropium (SPIRIVA HANDIHALER) 18 MCG inhalation capsule Place 1 capsule (18 mcg total) into inhaler and inhale daily. Qty: 90 capsule, Refills: 3           DISCHARGE INSTRUCTIONS:    DIET:  Cardiac diet  DISCHARGE CONDITION:  Stable  ACTIVITY:  Activity as tolerated  OXYGEN:  Home Oxygen: Yes.     Oxygen Delivery: 3 liters/min via Patient connected to nasal cannula oxygen  DISCHARGE LOCATION:  home   If you experience worsening of your admission symptoms, develop shortness of breath, life threatening emergency, suicidal or homicidal thoughts you must seek medical attention immediately by calling 911 or calling your MD immediately  if symptoms less severe.  You Must read complete instructions/literature along with all the possible adverse reactions/side effects for all the Medicines you take and that have been prescribed to you. Take any new Medicines after you have completely understood and accpet all the possible adverse reactions/side effects.   Please note  You were cared for by a hospitalist during your hospital stay. If you have any questions about your discharge medications or the care you received while you were in the hospital after you are discharged, you can call the unit and asked to speak with the hospitalist on call if the hospitalist that took care of you is not available. Once you are discharged, your primary care physician will handle any further medical issues. Please note that NO REFILLS for any discharge medications will be authorized once you are discharged, as it is imperative that you return to your primary care physician (or establish a relationship with a primary care physician if you do not have one) for your aftercare needs so that they can reassess your need for medications and monitor your lab values.    On the day of Discharge:   VITAL SIGNS:  Blood pressure (!) 128/59, pulse 87, temperature 97.7 F (36.5 C), temperature source Oral, resp. rate 18, height 5\' 10"  (1.778 m), weight 80 kg (176 lb 4.8 oz), SpO2 92 %.  I/O:   Intake/Output Summary (Last 24 hours) at 07/15/16 0932 Last data filed at  07/15/16 0617  Gross per 24 hour  Intake              393 ml  Output             1475 ml  Net            -1082 ml    PHYSICAL EXAMINATION:  GENERAL:  81 y.o.-year-old patient lying in the bed with no acute distress.  EYES: Pupils equal, round, reactive to light and accommodation. No scleral icterus. Extraocular muscles intact.  HEENT: Head atraumatic, normocephalic. Oropharynx and nasopharynx clear.  NECK:  Supple, no jugular venous distention. No thyroid enlargement, no tenderness.  LUNGS: Normal breath sounds bilaterally, no wheezing, rales,rhonchi or crepitation. No use of accessory muscles of respiration.  CARDIOVASCULAR: S1, S2 normal. No murmurs, rubs, or gallops.  ABDOMEN: Soft, non-tender, non-distended. Bowel sounds present. No organomegaly or mass.  EXTREMITIES: No pedal edema, cyanosis, or clubbing.  NEUROLOGIC: Cranial nerves II through XII are intact. Muscle strength 5/5 in all extremities. Sensation intact. Gait not checked.  PSYCHIATRIC: The patient is alert and oriented x 3.  SKIN: No obvious rash, lesion, or ulcer.   DATA REVIEW:   CBC  Recent Labs Lab 07/11/16 0615 07/13/16 0400  WBC 6.3  --   HGB 8.9* 8.8*  HCT 27.2*  --   PLT 243  --     Chemistries   Recent Labs Lab 07/10/16 2048  07/15/16 0339  NA 140  < > 139  K 3.2*  < > 3.3*  CL 100*  < > 95*  CO2 33*  < > 36*  GLUCOSE 201*  < > 258*  BUN 31*  < > 37*  CREATININE 1.66*  < > 1.78*  CALCIUM 7.7*  < > 7.4*  MG  --   < > 1.8  AST 18  --   --   ALT 24  --   --   ALKPHOS 107  --   --   BILITOT 0.3  --   --   < > = values in this interval not displayed.  Cardiac Enzymes  Recent Labs Lab 07/11/16 1145  TROPONINI 0.03*    Microbiology Results  Results for orders placed or performed during the hospital encounter of 07/10/16  Blood culture (routine x 2)     Status: None (Preliminary result)   Collection Time: 07/10/16 11:05 PM  Result Value Ref Range Status   Specimen Description BLOOD  RIGHT ASSIST CONTROL  Final   Special Requests   Final    BOTTLES DRAWN AEROBIC AND ANAEROBIC 18CCAERO,16CCANA   Culture NO GROWTH 4 DAYS  Final   Report Status PENDING  Incomplete  Blood culture (routine x 2)     Status: None (Preliminary result)   Collection Time: 07/10/16 11:08 PM  Result Value Ref Range Status   Specimen Description BLOOD RIGHT HAND  Final   Special Requests   Final    BOTTLES DRAWN AEROBIC AND ANAEROBIC 14CCAERO,11CCANA   Culture NO GROWTH 4 DAYS  Final   Report Status PENDING  Incomplete  MRSA PCR Screening     Status: None   Collection Time: 07/11/16  7:43 AM  Result Value Ref Range Status   MRSA by PCR NEGATIVE NEGATIVE Final    Comment:        The GeneXpert MRSA Assay (FDA approved for NASAL specimens only), is one component of a comprehensive MRSA colonization surveillance program. It is not intended to diagnose MRSA infection nor to guide or monitor treatment for MRSA infections.     RADIOLOGY:  Dg Chest 2 View  Result Date: 07/14/2016 CLINICAL DATA:  Pleural effusion. EXAM: CHEST  2 VIEW COMPARISON:  CT 07/12/2016.  Chest x-ray 07/11/2016. FINDINGS: Cardiomegaly pulmonary vascular prominence and bilateral interstitial prominence consistent congestive heart failure. Bilateral pleural effusions. No pneumothorax. IMPRESSION: Congestive heart failure with bilateral interstitial edema and bilateral pleural effusions. Electronically Signed   By: [name]  Register   On: 07/14/2016 14:40     Management plans discussed with the patient, family and they are in agreement.  CODE STATUS:     Code Status Orders        Start     Ordered   07/11/16 0019  Full code  Continuous     07/11/16 0018    Code Status History    Date Active Date Inactive Code Status Order ID Comments User Context   07/11/2016 12:18  AM 07/11/2016  5:07 PM Full Code [id number]  Lance Coon, MD Inpatient   06/18/2016  3:40 AM 06/21/2016  6:44 PM Full Code [id number]  Saundra Shelling, MD  ED      TOTAL TIME TAKING CARE OF THIS PATIENT: 32 minutes.    Hower,  Karenann Cai.D on 07/15/2016 at 9:32 AM  Between 7am to 6pm - Pager - [phone]  After 6pm go to [email or web address] - Proofreader  [facility]  Office  [phone]  CC: Primary care physician; Perrin Maltese, MD

## 2016-07-27 ENCOUNTER — Telehealth: Payer: Self-pay

## 2016-07-27 NOTE — Telephone Encounter (Signed)
Received forms from [place] in regards to this patient's CT scan that was performed on 07/12/16, pt is scheduled for a rov with DR on 09/01/16. [facility] is concerned about a possible pleural fluid aspiration & path.  DR please advise if you would like to see this patient sooner then he's scheduled appt. Thanks.

## 2016-07-28 NOTE — Telephone Encounter (Signed)
Per DK (verbally) pt should be scheduled sooner then October. Pt is scheduled with DR 08/11/16 @ 11:45. Pt aware and voices understanding. Nothing further needed.

## 2016-08-05 ENCOUNTER — Ambulatory Visit (INDEPENDENT_AMBULATORY_CARE_PROVIDER_SITE_OTHER): Payer: Medicare PPO | Admitting: Internal Medicine

## 2016-08-05 ENCOUNTER — Encounter: Payer: Self-pay | Admitting: Internal Medicine

## 2016-08-05 VITALS — BP 118/58 | HR 75 | Ht 71.0 in | Wt 188.0 lb

## 2016-08-05 DIAGNOSIS — J90 Pleural effusion, not elsewhere classified: Secondary | ICD-10-CM

## 2016-08-05 DIAGNOSIS — J432 Centrilobular emphysema: Secondary | ICD-10-CM

## 2016-08-05 NOTE — Patient Instructions (Signed)
Follow up with Dr. Juanell Fairly in:1 week - 2 view CXR prior to follow up visit - cont with CHF meds - cont with current inhalers - cont with supplemental O2

## 2016-08-05 NOTE — Assessment & Plan Note (Signed)
Bilateral pleural effusion seen on CAT scan. Physical examination today shows increase air movement on the right with decreased breath sounds on the left with fine crackles. I suspect that he has had radiographic lag from his recent HCAP, that was seen at this cardiologist office.   At this time these effusions are clinically resolving, and no acute intervention is needed such as thoracentesis. Patient is not septic appearing and no further antibiotics or steroids from a pulmonary standpoint as needed also.  Plan: -Follow-up in one week with two-view chest x-ray

## 2016-08-05 NOTE — Progress Notes (Signed)
[facility] Consultation      MRN# [id number] Brett [name] Mar 11, 1935   CC: Chief Complaint  Patient presents with  . Follow-up    feeling good; SOB w/excertion and when he bends over and then up; walking more; doing PT at home;      Events since last clinic visit: Patient presents today for follow-up visit at the request of his cardiologist secondary to continue pleural effusions. History per caregiver who is in the room, who is his neighbor. Patient was recently admitted to the hospital from August 12 to August 17, for healthcare associated pneumonia, acute by bilateral pleural effusions. There are now has a shiny also had a right heart cardiac catheterization which is self reported as normal. He follows closely with his cardiologist, Dr. [name], who obtained another x-ray recently and suspected the patient still has pneumonia, based on his x-ray results, even though he was not having increased shortness of breath, cough, sputum production, or fever or chills. At today's visit he has chronic shortness of breath, is wearing his 3 L of oxygen with exertion and at night. His saturation since his pneumonia on 3 L is usually averaging about 95-96 percent, he is in no acute respiratory distress today. Examination today shows that he has improved breath sounds on the right side with decreased breath sounds and fine crackles on the left.     Current Outpatient Prescriptions:  .  amLODipine (NORVASC) 5 MG tablet, Take 5 mg by mouth daily. , Disp: , Rfl:  .  aspirin (ADULT ASPIRIN EC LOW STRENGTH) 81 MG EC tablet, Take 1 tablet by mouth daily., Disp: , Rfl:  .  atorvastatin (LIPITOR) 20 MG tablet, Take 20 mg by mouth daily., Disp: , Rfl:  .  clopidogrel (PLAVIX) 75 MG tablet, Take 75 mg by mouth daily. , Disp: , Rfl:  .  diltiazem (CARDIZEM CD) 180 MG 24 hr capsule, Take 1 capsule (180 mg total) by mouth daily., Disp: 30 capsule, Rfl: 0 .  Fluticasone-Salmeterol (ADVAIR  DISKUS) 250-50 MCG/DOSE AEPB, Inhale 1 puff into the lungs 2 times daily at 12 noon and 4 pm., Disp: 1 each, Rfl: 0 .  furosemide (LASIX) 40 MG tablet, Take 1 tablet by mouth daily., Disp: , Rfl:  .  gabapentin (NEURONTIN) 100 MG capsule, Take 1 capsule by mouth at bedtime. , Disp: , Rfl:  .  hydrALAZINE (APRESOLINE) 25 MG tablet, Take 2 tablets (50 mg total) by mouth 3 (three) times daily., Disp: 90 tablet, Rfl: 0 .  hydrochlorothiazide (HYDRODIURIL) 25 MG tablet, Take 25 mg by mouth daily., Disp: , Rfl: 3 .  metoprolol (TOPROL-XL) 200 MG 24 hr tablet, Take 1 tablet by mouth daily., Disp: , Rfl:  .  senna-docusate (SENOKOT-S) 8.6-50 MG tablet, Take 1 tablet by mouth 2 (two) times daily., Disp: , Rfl:  .  tiotropium (SPIRIVA HANDIHALER) 18 MCG inhalation capsule, Place 1 capsule (18 mcg total) into inhaler and inhale daily., Disp: 90 capsule, Rfl: 3   Review of Systems  Constitutional: Negative for chills and fever.  Eyes: Negative for blurred vision and double vision.  Respiratory: Positive for shortness of breath. Negative for cough, sputum production and wheezing.   Gastrointestinal: Negative for heartburn.  Genitourinary: Negative for dysuria.  Musculoskeletal: Negative for myalgias.  Skin: Negative for rash.  Neurological: Negative for headaches.      Allergies:  Review of patient's allergies indicates no known allergies.  Physical Examination:  VS: BP (!) 118/58 (BP Location:  Left Arm, Cuff Size: Normal)   Pulse 75   Ht 5\' 11"  (1.803 m)   Wt 188 lb (85.3 kg)   SpO2 91%   BMI 26.22 kg/m   General Appearance: No distress  HEENT: PERRLA, no ptosis, no other lesions noticed Pulmonary:dec L base BS sound with fine crackles, good respiratory effort, mild dec BS on the Right   Cardiovascular:  Normal S1,S2.  No m/r/g.     Abdomen:Exam: Benign, Soft, non-tender, No masses  Skin:   warm, no rashes, no ecchymosis  Extremities: bilateral 2+ pitting edema to mid legs.       Rad  results: (The following images and results were reviewed by Dr. Stevenson Clinch on 08/05/2016). CXR 07/14/16 FINDINGS: Cardiomegaly pulmonary vascular prominence and bilateral interstitial prominence consistent congestive heart failure. Bilateral pleural effusions. No pneumothorax.  IMPRESSION: Congestive heart failure with bilateral interstitial edema and bilateral pleural effusions.      Assessment and Plan: 81 year old male with severe centrilobular emphysema, diastolic heart failure, pulmonary hypertension, seen in clinic today for bilateral pleural effusions at the request of his cardiologist Pleural effusion Bilateral pleural effusion seen on CAT scan. Physical examination today shows increase air movement on the right with decreased breath sounds on the left with fine crackles. I suspect that he has had radiographic lag from his recent HCAP, that was seen at this cardiologist office.   At this time these effusions are clinically resolving, and no acute intervention is needed such as thoracentesis. Patient is not septic appearing and no further antibiotics or steroids from a pulmonary standpoint as needed also.  Plan: -Follow-up in one week with two-view chest x-ray  COPD (chronic obstructive pulmonary disease) ([place]) Currently following closely with Dr. Ashby Dawes.  Current COPD regiment includes: 3 L of oxygen with exertion and sleep Spiriva, Dulera  Plan: -No acute exacerbation of COPD, continue with current inhalers and oxygen regiment.   Updated Medication List Outpatient Encounter Prescriptions as of 08/05/2016  Medication Sig  . amLODipine (NORVASC) 5 MG tablet Take 5 mg by mouth daily.   Marland Kitchen aspirin (ADULT ASPIRIN EC LOW STRENGTH) 81 MG EC tablet Take 1 tablet by mouth daily.  Marland Kitchen atorvastatin (LIPITOR) 20 MG tablet Take 20 mg by mouth daily.  . clopidogrel (PLAVIX) 75 MG tablet Take 75 mg by mouth daily.   Marland Kitchen diltiazem (CARDIZEM CD) 180 MG 24 hr capsule Take 1 capsule (180 mg  total) by mouth daily.  . Fluticasone-Salmeterol (ADVAIR DISKUS) 250-50 MCG/DOSE AEPB Inhale 1 puff into the lungs 2 times daily at 12 noon and 4 pm.  . furosemide (LASIX) 40 MG tablet Take 1 tablet by mouth daily.  Marland Kitchen gabapentin (NEURONTIN) 100 MG capsule Take 1 capsule by mouth at bedtime.   . hydrALAZINE (APRESOLINE) 25 MG tablet Take 2 tablets (50 mg total) by mouth 3 (three) times daily.  . hydrochlorothiazide (HYDRODIURIL) 25 MG tablet Take 25 mg by mouth daily.  . metoprolol (TOPROL-XL) 200 MG 24 hr tablet Take 1 tablet by mouth daily.  Marland Kitchen senna-docusate (SENOKOT-S) 8.6-50 MG tablet Take 1 tablet by mouth 2 (two) times daily.  Marland Kitchen tiotropium (SPIRIVA HANDIHALER) 18 MCG inhalation capsule Place 1 capsule (18 mcg total) into inhaler and inhale daily.  . [DISCONTINUED] levofloxacin (LEVAQUIN) 500 MG tablet Take 1 tablet (500 mg total) by mouth daily.   No facility-administered encounter medications on file as of 08/05/2016.     Orders for this visit: Orders Placed This Encounter  Procedures  . DG Chest 2 View  Standing Status:   Future    Standing Expiration Date:   10/05/2017    Order Specific Question:   Reason for Exam (SYMPTOM  OR DIAGNOSIS REQUIRED)    Answer:   pleural effusion    Order Specific Question:   Preferred imaging location?    Answer:   [facility]    Thank  you for the visitation and for allowing  [facility] to assist in the care of your patient. Our recommendations are noted above.  Please contact us if we can be of further service.  Vilinda Boehringer, MD [facility] Office Number: [phone]  Note: This note was prepared with Dragon dictation along with smaller phrase technology. Any transcriptional errors that result from this process are unintentional.

## 2016-08-05 NOTE — Assessment & Plan Note (Signed)
Currently following closely with Dr. Ashby Dawes.  Current COPD regiment includes: 3 L of oxygen with exertion and sleep Spiriva, Dulera  Plan: -No acute exacerbation of COPD, continue with current inhalers and oxygen regiment.

## 2016-08-06 ENCOUNTER — Encounter: Payer: Self-pay | Admitting: Family

## 2016-08-06 ENCOUNTER — Ambulatory Visit: Payer: Medicare PPO | Attending: Family | Admitting: Family

## 2016-08-06 VITALS — BP 116/51 | HR 71 | Resp 20 | Ht 70.0 in | Wt 187.0 lb

## 2016-08-06 DIAGNOSIS — I110 Hypertensive heart disease with heart failure: Secondary | ICD-10-CM | POA: Diagnosis not present

## 2016-08-06 DIAGNOSIS — J449 Chronic obstructive pulmonary disease, unspecified: Secondary | ICD-10-CM | POA: Insufficient documentation

## 2016-08-06 DIAGNOSIS — Z7982 Long term (current) use of aspirin: Secondary | ICD-10-CM | POA: Insufficient documentation

## 2016-08-06 DIAGNOSIS — R600 Localized edema: Secondary | ICD-10-CM | POA: Insufficient documentation

## 2016-08-06 DIAGNOSIS — Z7902 Long term (current) use of antithrombotics/antiplatelets: Secondary | ICD-10-CM | POA: Diagnosis not present

## 2016-08-06 DIAGNOSIS — I10 Essential (primary) hypertension: Secondary | ICD-10-CM

## 2016-08-06 DIAGNOSIS — Z8572 Personal history of non-Hodgkin lymphomas: Secondary | ICD-10-CM | POA: Diagnosis not present

## 2016-08-06 DIAGNOSIS — Z9981 Dependence on supplemental oxygen: Secondary | ICD-10-CM | POA: Diagnosis not present

## 2016-08-06 DIAGNOSIS — D509 Iron deficiency anemia, unspecified: Secondary | ICD-10-CM | POA: Insufficient documentation

## 2016-08-06 DIAGNOSIS — Z8711 Personal history of peptic ulcer disease: Secondary | ICD-10-CM | POA: Insufficient documentation

## 2016-08-06 DIAGNOSIS — Z955 Presence of coronary angioplasty implant and graft: Secondary | ICD-10-CM | POA: Diagnosis not present

## 2016-08-06 DIAGNOSIS — Z9221 Personal history of antineoplastic chemotherapy: Secondary | ICD-10-CM | POA: Insufficient documentation

## 2016-08-06 DIAGNOSIS — I2510 Atherosclerotic heart disease of native coronary artery without angina pectoris: Secondary | ICD-10-CM | POA: Diagnosis not present

## 2016-08-06 DIAGNOSIS — J432 Centrilobular emphysema: Secondary | ICD-10-CM | POA: Diagnosis not present

## 2016-08-06 DIAGNOSIS — Z87891 Personal history of nicotine dependence: Secondary | ICD-10-CM | POA: Insufficient documentation

## 2016-08-06 DIAGNOSIS — I5032 Chronic diastolic (congestive) heart failure: Secondary | ICD-10-CM | POA: Insufficient documentation

## 2016-08-06 DIAGNOSIS — E785 Hyperlipidemia, unspecified: Secondary | ICD-10-CM | POA: Insufficient documentation

## 2016-08-06 DIAGNOSIS — Z8679 Personal history of other diseases of the circulatory system: Secondary | ICD-10-CM | POA: Diagnosis not present

## 2016-08-06 NOTE — Patient Instructions (Signed)
Resume weighing daily and call for an overnight weight gain of > 2 pounds or a weekly weight gain of >5 pounds. 

## 2016-08-06 NOTE — Progress Notes (Signed)
Subjective:    Patient ID: Brett [name], male    DOB: 16-Jun-1935, 81 y.o.   MRN: [id number]  Congestive Heart Failure  Presents for initial visit. The disease course has been improving. Associated symptoms include edema and shortness of breath ("when bending over at the waist"). Pertinent negatives include no abdominal pain, chest pain, fatigue, orthopnea or palpitations. The symptoms have been improving. Past treatments include beta blockers, oxygen and salt and fluid restriction. The treatment provided moderate relief. Compliance with prior treatments has been good. His past medical history is significant for anemia, CAD, chronic lung disease, HTN and valvular heart disease.  Hypertension  This is a chronic problem. The current episode started more than 1 year ago. The problem is unchanged. The problem is controlled. Associated symptoms include peripheral edema and shortness of breath ("when bending over at the waist"). Pertinent negatives include no chest pain, neck pain or palpitations. There are no associated agents to hypertension. Risk factors for coronary artery disease include dyslipidemia, male gender and smoking/tobacco exposure. Past treatments include beta blockers, calcium channel blockers, diuretics and lifestyle changes. The current treatment provides significant improvement. There are no compliance problems.  Hypertensive end-organ damage includes CAD/MI and heart failure.   Past Medical History:  Diagnosis Date  . Anemia   . Aortic regurgitation   . B-cell lymphoma ([place]) 2009   DX AT [facility]  . Bronchitis   . CAD (coronary artery disease)   . Carotid stenosis   . Diabetes mellitus, type 2 ([place])   . Emphysema of lung ([place])   . Essential hypertension   . History of chemotherapy   . Hyperlipidemia   . IDA (iron deficiency anemia)   . Leg edema   . Meralgia paraesthetica   . Mitral regurgitation   . PUD (peptic ulcer disease)   . PVD (peripheral vascular disease) ([place])    . Tobacco abuse     Past Surgical History:  Procedure Laterality Date  . CARDIAC CATHETERIZATION  08/15/2007  . CARDIAC CATHETERIZATION Right 07/13/2016   Procedure: Right/Left Heart Cath and Coronary Angiography;  Surgeon: Dionisio David, MD;  Location: [facility];  Service: Cardiovascular;  Laterality: Right;  . COLONOSCOPY  03/2013  . ESOPHAGOGASTRODUODENOSCOPY  03/2013    Family History  Problem Relation Age of Onset  . Rheum arthritis Neg Hx   . Osteoarthritis Neg Hx   . Asthma Neg Hx   . Diabetes Neg Hx   . Cancer Neg Hx     Social History  Substance Use Topics  . Smoking status: Former Smoker    Types: Cigarettes    Quit date: 08/30/2015  . Smokeless tobacco: Never Used  . Alcohol use No    No Known Allergies  Prior to Admission medications   Medication Sig Start Date End Date Taking? Authorizing Provider  amLODipine (NORVASC) 5 MG tablet Take 5 mg by mouth daily.  08/27/15  Yes Historical Provider, MD  aspirin (ADULT ASPIRIN EC LOW STRENGTH) 81 MG EC tablet Take 1 tablet by mouth daily. 09/19/15  Yes Historical Provider, MD  atorvastatin (LIPITOR) 20 MG tablet Take 20 mg by mouth daily.   Yes Historical Provider, MD  clopidogrel (PLAVIX) 75 MG tablet Take 75 mg by mouth daily.  08/27/15  Yes Historical Provider, MD  Fluticasone-Salmeterol (ADVAIR DISKUS) 250-50 MCG/DOSE AEPB Inhale 1 puff into the lungs 2 times daily at 12 noon and 4 pm. 06/21/16  Yes [name], MD  furosemide (LASIX) 40 MG tablet  Take 1 tablet by mouth 2 (two) times daily.  09/22/15  Yes Historical Provider, MD  gabapentin (NEURONTIN) 100 MG capsule Take 1 capsule by mouth at bedtime.  09/22/15  Yes Historical Provider, MD  hydrALAZINE (APRESOLINE) 25 MG tablet Take 2 tablets (50 mg total) by mouth 3 (three) times daily. 06/21/16  Yes [name], MD  hydrochlorothiazide (HYDRODIURIL) 25 MG tablet Take 25 mg by mouth daily. 09/16/15  Yes Historical Provider, MD  metoprolol (TOPROL-XL) 200 MG  24 hr tablet Take 1 tablet by mouth daily. 09/22/15  Yes Historical Provider, MD  senna-docusate (SENOKOT-S) 8.6-50 MG tablet Take 1 tablet by mouth 2 (two) times daily. Patient taking differently: Take 1 tablet by mouth 2 (two) times daily as needed.  06/21/16  Yes [name], MD  tiotropium (SPIRIVA HANDIHALER) 18 MCG inhalation capsule Place 1 capsule (18 mcg total) into inhaler and inhale daily. 07/05/16  Yes Laverle Hobby, MD      Review of Systems  Constitutional: Negative for appetite change and fatigue.  HENT: Positive for hearing loss. Negative for congestion, postnasal drip and sore throat.   Eyes: Positive for visual disturbance (left eye). Negative for itching.  Respiratory: Positive for shortness of breath ("when bending over at the waist"). Negative for cough, chest tightness and wheezing.   Cardiovascular: Positive for leg swelling. Negative for chest pain and palpitations.  Gastrointestinal: Negative for abdominal distention and abdominal pain.  Endocrine: Negative.   Genitourinary: Negative.   Musculoskeletal: Negative for back pain and neck pain.  Skin: Negative.   Allergic/Immunologic: Negative.   Neurological: Negative for dizziness and light-headedness.  Hematological: Negative for adenopathy. Bruises/bleeds easily.  Psychiatric/Behavioral: Positive for sleep disturbance (chronic frequent waking during the night). Negative for dysphoric mood. The patient is not nervous/anxious.        Objective:   Physical Exam  Constitutional: He is oriented to person, place, and time. He appears well-developed and well-nourished.  HENT:  Head: Normocephalic and atraumatic.  Eyes: Conjunctivae are normal. Pupils are equal, round, and reactive to light.  Neck: Normal range of motion. Neck supple.  Cardiovascular: Normal rate and regular rhythm.   Pulmonary/Chest: Effort normal. He has no wheezes. He has rales in the left lower field.  Abdominal: Soft. He exhibits no  distension. There is no tenderness.  Musculoskeletal: He exhibits edema (2+ pitting edema in bilateral lower legs). He exhibits no tenderness.  Neurological: He is alert and oriented to person, place, and time.  Skin: Skin is warm and dry.  Psychiatric: He has a normal mood and affect. His behavior is normal. Thought content normal.  Nursing note and vitals reviewed.   BP (!) 116/51   Pulse 71   Resp 20   Ht 5\' 10"  (1.778 m)   Wt 187 lb (84.8 kg)   SpO2 99% Comment: on 3L  BMI 26.83 kg/m        Assessment & Plan:  1: Chronic heart failure with preserved ejection fraction- Patient presents with shortness of breath when bending over at the waist which quickly improves when he sits back up (Class II). Denies any fatigue. Does have chronic swelling in his lower legs and does wear his TED hose daily. Has recently had his diuretic adjusted by his cardiologist [name]). Does elevate his legs when he's at home sitting in his recliner. Has not been weighing himself because he doesn't have any scales so a set of scales was given to him today. He was instructed to weigh himself daily first  thing in the morning and call for an overnight weight gain of >2 pounds or a weekly weight gain of >5 pounds. He admits to adding salt to "some foods" and the importance of not adding any salt to food was discussed. Discussed how to read food labels and the importance of closely following a 2000mg  sodium diet was discussed and written information was also given to him. Is currently having physical therapy come twice a week. 2: HTN- Blood pressure looks good today. Continue medications at this time. 3: COPD- Continues to have some rales in his LLL and has been recently seen by his pulmonologist. Wears his oxygen at 3L around the clock as well as use his inhalers.  Patient did not bring his medications nor a list. Each medication was verbally reviewed with the patient and he was encouraged to bring the bottles to every  visit to confirm accuracy of list.  Return in 1 month or sooner for any questions/problems before then.

## 2016-08-11 ENCOUNTER — Ambulatory Visit: Payer: Medicare PPO | Admitting: Internal Medicine

## 2016-08-11 NOTE — Progress Notes (Signed)
* [facility]     Assessment and Plan:  81 year old male, who was admitted to the hospital in July for pneumonia. Subsequently was placed on oxygen. History of emphysema with good functional status.  Pleural effusions. -CXR 08/12/16: Images reviewed. Mild right pleural effusion, significantly improved from previous.  Pulmonary hypertension. -Evidence of pulmonary hypertension seen on CT scan with a large pulmonary arteries, however, on review of the hemodynamics from recent left/right heart cath-- the mean pulmonary artery pressure was 25, LVEDP was elevated. This is consistent with normal pulmonary pressures.   Emphysema.  -Seen on most recent CT images. --Continue spiriva and dulera.  --Continue oxygen at night at 2L, can discontinue ambulatory oxygen. -Patient will be completing a pulmonary function test and following up in approximately 3 months.   Date: 08/11/2016  MRN# [id number] Brett [name] 08-20-1935   Brett [name] is a 81 y.o. old male seen in follow up for chief complaint of  Chief Complaint  Patient presents with  . Follow-up    pleural effusion; no complaints; O2 levels drop w/exertion. using O2 at night and w/exertion     HPI:   Patient presents today for follow-up visit for pleural effusions. History per caregiver who is in the room, who is his neighbor. Patient was recently admitted to the hospital from August 12 to August 17, for healthcare associated pneumonia, acute by bilateral pleural effusions. Before that hospitalization he would walk 2 miles per day and was very active and would go to work every day. Today he notes that his breathing has been feeling well. He has been using 3L at night and with activity.    He follows closely with his cardiologist, Dr. [name]. Review of the CT scan images from 07/12/16: There is biapical emphysema, bilateral pleural effusions, moderate on the right, small on the left. Chest x-ray 07/14/16 images  reviewed, continued bilateral pleural effusions.Chest x-ray images taken today also reviewed, shows a minimal right-sided pleural effusion, otherwise unremarkable. Lungs with mild hyperinflation.  At today's visit he has chronic shortness of breath, is wearing his 3 L of oxygen with exertion and at night. His saturation since his pneumonia on 3 L is usually averaging about 95-96 percent, he is in no acute respiratory distress today. Examination today shows that he has improved breath sounds on the right side with decreased breath sounds and fine crackles on the left. He quit smoking about a year ago. He is taking spiriva once daily and the dulera 2 puffs once daily.   Findings from right to left heart cath on 07/13/2016:  Mid LAD to Dist LAD lesion, 40 %stenosed.  Prox Cx to Dist Cx lesion, 30 %stenosed.  Mid RCA to Dist RCA lesion, 30 %stenosed.  Hemodynamic findings consistent with pulmonary hypertension.                    Elevated LV EDP consistent with volume overload; LVEF on echo was 55 percent   On my review of the reported hemodynamics: PAP: 40/10; mean equals 25. RVSP: 45. LVEDP: 33, 12 (uncertain which measurements are from the right versus left heart caths).  Oxygen sat today on RA at rest was 93% with HR 76, after walking 600 feet he has mild dyspnea, sat dropped to 87% and HR 103.   Medication:   Outpatient Encounter Prescriptions as of 08/12/2016  Medication Sig  . amLODipine (NORVASC) 5 MG tablet Take 5 mg by mouth daily.   Marland Kitchen aspirin (ADULT  ASPIRIN EC LOW STRENGTH) 81 MG EC tablet Take 1 tablet by mouth daily.  Marland Kitchen atorvastatin (LIPITOR) 20 MG tablet Take 20 mg by mouth daily.  . clopidogrel (PLAVIX) 75 MG tablet Take 75 mg by mouth daily.   . Fluticasone-Salmeterol (ADVAIR DISKUS) 250-50 MCG/DOSE AEPB Inhale 1 puff into the lungs 2 times daily at 12 noon and 4 pm.  . furosemide (LASIX) 40 MG tablet Take 1 tablet by mouth 2 (two) times daily.   Marland Kitchen gabapentin (NEURONTIN) 100  MG capsule Take 1 capsule by mouth at bedtime.   . hydrALAZINE (APRESOLINE) 25 MG tablet Take 2 tablets (50 mg total) by mouth 3 (three) times daily.  . hydrochlorothiazide (HYDRODIURIL) 25 MG tablet Take 25 mg by mouth daily.  . metoprolol (TOPROL-XL) 200 MG 24 hr tablet Take 1 tablet by mouth daily.  Marland Kitchen senna-docusate (SENOKOT-S) 8.6-50 MG tablet Take 1 tablet by mouth 2 (two) times daily. (Patient taking differently: Take 1 tablet by mouth 2 (two) times daily as needed. )  . tiotropium (SPIRIVA HANDIHALER) 18 MCG inhalation capsule Place 1 capsule (18 mcg total) into inhaler and inhale daily.   No facility-administered encounter medications on file as of 08/12/2016.      Allergies:  Review of patient's allergies indicates no known allergies.  Review of Systems: Gen:  Denies  fever, sweats. HEENT: Denies blurred vision. Cvc:  No dizziness, chest pain or heaviness Resp:   Denies cough or sputum porduction. Gi: Denies swallowing difficulty, stomach pain. Gu:  Denies bladder incontinence, burning urine Ext:   No Joint pain, stiffness. Skin: No skin rash, easy bruising. Endoc:  No polyuria, polydipsia. Psych: No depression, insomnia. Other:  All other systems were reviewed and found to be negative other than what is mentioned in the HPI.   Physical Examination:   VS: BP (!) 114/58 (BP Location: Right Arm, Cuff Size: Normal)   Pulse 73   Wt 184 lb (83.5 kg)   SpO2 92%   BMI 26.40 kg/m     General Appearance: No distress  Neuro:without focal findings,  speech normal,  HEENT: PERRLA, EOM intact. Pulmonary: normal breath sounds, No wheezing.   CardiovascularNormal S1,S2.  No m/r/g.   Abdomen: Benign, Soft, non-tender. Renal:  No costovertebral tenderness  GU:  Not performed at this time. Endoc: No evident thyromegaly, no signs of acromegaly. Skin:   warm, no rash. Extremities: normal, no cyanosis, clubbing.   LABORATORY PANEL:   CBC No results for input(s): WBC, HGB, HCT,  PLT in the last 168 hours. ------------------------------------------------------------------------------------------------------------------  Chemistries  No results for input(s): NA, K, CL, CO2, GLUCOSE, BUN, CREATININE, CALCIUM, MG, AST, ALT, ALKPHOS, BILITOT in the last 168 hours.  Invalid input(s): GFRCGP ------------------------------------------------------------------------------------------------------------------  Cardiac Enzymes No results for input(s): TROPONINI in the last 168 hours. ------------------------------------------------------------  RADIOLOGY:   No results found for this or any previous visit. Results for orders placed during the hospital encounter of 07/10/16  DG Chest 2 View   Narrative CLINICAL DATA:  Pleural effusion.  EXAM: CHEST  2 VIEW  COMPARISON:  CT 07/12/2016.  Chest x-ray 07/11/2016.  FINDINGS: Cardiomegaly pulmonary vascular prominence and bilateral interstitial prominence consistent congestive heart failure. Bilateral pleural effusions. No pneumothorax.  IMPRESSION: Congestive heart failure with bilateral interstitial edema and bilateral pleural effusions.   Electronically Signed   By: [name]  Register   On: 07/14/2016 14:40    ------------------------------------------------------------------------------------------------------------------  Thank  you for allowing [facility] to assist in the care of your  patient. Our recommendations are noted above.  Please contact us if we can be of further service.   Marda Stalker, MD.  [facility] Office Number: [phone]  Patricia Pesa, M.D.  Vilinda Boehringer, M.D.  Merton Border, M.D  08/11/2016

## 2016-08-12 ENCOUNTER — Encounter: Payer: Self-pay | Admitting: Internal Medicine

## 2016-08-12 ENCOUNTER — Ambulatory Visit (INDEPENDENT_AMBULATORY_CARE_PROVIDER_SITE_OTHER): Payer: Medicare PPO | Admitting: Internal Medicine

## 2016-08-12 ENCOUNTER — Encounter: Payer: Self-pay | Admitting: *Deleted

## 2016-08-12 ENCOUNTER — Ambulatory Visit: Payer: Medicare PPO | Admitting: Pulmonary Disease

## 2016-08-12 ENCOUNTER — Ambulatory Visit
Admission: RE | Admit: 2016-08-12 | Discharge: 2016-08-12 | Disposition: A | Discharge status: Home / Self Care | Payer: Medicare PPO | Source: Ambulatory Visit | Attending: Internal Medicine | Admitting: Internal Medicine

## 2016-08-12 VITALS — BP 114/58 | HR 73 | Wt 184.0 lb

## 2016-08-12 DIAGNOSIS — J90 Pleural effusion, not elsewhere classified: Secondary | ICD-10-CM | POA: Insufficient documentation

## 2016-08-12 DIAGNOSIS — J438 Other emphysema: Secondary | ICD-10-CM

## 2016-08-12 DIAGNOSIS — J9811 Atelectasis: Secondary | ICD-10-CM | POA: Diagnosis not present

## 2016-08-12 NOTE — Patient Instructions (Signed)
--  Continue spiriva and dulera.  --Continue oxygen at night at 2L, can discontinue portable oxygen.  --OK to go back to work. Ok to drive.

## 2016-08-30 ENCOUNTER — Encounter: Payer: Self-pay | Admitting: *Deleted

## 2016-09-01 ENCOUNTER — Ambulatory Visit: Payer: Medicare PPO | Admitting: Internal Medicine

## 2016-09-03 ENCOUNTER — Ambulatory Visit: Payer: Medicare PPO | Attending: Family | Admitting: Family

## 2016-09-03 ENCOUNTER — Encounter: Payer: Self-pay | Admitting: Family

## 2016-09-03 VITALS — BP 117/51 | HR 88 | Resp 20 | Ht 70.0 in | Wt 183.0 lb

## 2016-09-03 DIAGNOSIS — I2510 Atherosclerotic heart disease of native coronary artery without angina pectoris: Secondary | ICD-10-CM | POA: Insufficient documentation

## 2016-09-03 DIAGNOSIS — Z87891 Personal history of nicotine dependence: Secondary | ICD-10-CM | POA: Insufficient documentation

## 2016-09-03 DIAGNOSIS — R531 Weakness: Secondary | ICD-10-CM | POA: Diagnosis not present

## 2016-09-03 DIAGNOSIS — J432 Centrilobular emphysema: Secondary | ICD-10-CM

## 2016-09-03 DIAGNOSIS — I509 Heart failure, unspecified: Secondary | ICD-10-CM | POA: Diagnosis present

## 2016-09-03 DIAGNOSIS — E119 Type 2 diabetes mellitus without complications: Secondary | ICD-10-CM | POA: Diagnosis not present

## 2016-09-03 DIAGNOSIS — Z9221 Personal history of antineoplastic chemotherapy: Secondary | ICD-10-CM | POA: Diagnosis not present

## 2016-09-03 DIAGNOSIS — Z7984 Long term (current) use of oral hypoglycemic drugs: Secondary | ICD-10-CM | POA: Diagnosis not present

## 2016-09-03 DIAGNOSIS — Z7951 Long term (current) use of inhaled steroids: Secondary | ICD-10-CM | POA: Insufficient documentation

## 2016-09-03 DIAGNOSIS — I10 Essential (primary) hypertension: Secondary | ICD-10-CM

## 2016-09-03 DIAGNOSIS — Z7902 Long term (current) use of antithrombotics/antiplatelets: Secondary | ICD-10-CM | POA: Insufficient documentation

## 2016-09-03 DIAGNOSIS — Z8711 Personal history of peptic ulcer disease: Secondary | ICD-10-CM | POA: Insufficient documentation

## 2016-09-03 DIAGNOSIS — E785 Hyperlipidemia, unspecified: Secondary | ICD-10-CM | POA: Diagnosis not present

## 2016-09-03 DIAGNOSIS — I739 Peripheral vascular disease, unspecified: Secondary | ICD-10-CM | POA: Diagnosis not present

## 2016-09-03 DIAGNOSIS — Z8572 Personal history of non-Hodgkin lymphomas: Secondary | ICD-10-CM | POA: Insufficient documentation

## 2016-09-03 DIAGNOSIS — J449 Chronic obstructive pulmonary disease, unspecified: Secondary | ICD-10-CM | POA: Insufficient documentation

## 2016-09-03 DIAGNOSIS — Z7982 Long term (current) use of aspirin: Secondary | ICD-10-CM | POA: Diagnosis not present

## 2016-09-03 DIAGNOSIS — R29898 Other symptoms and signs involving the musculoskeletal system: Secondary | ICD-10-CM

## 2016-09-03 DIAGNOSIS — I110 Hypertensive heart disease with heart failure: Secondary | ICD-10-CM | POA: Insufficient documentation

## 2016-09-03 DIAGNOSIS — I5032 Chronic diastolic (congestive) heart failure: Secondary | ICD-10-CM

## 2016-09-03 DIAGNOSIS — Z79899 Other long term (current) drug therapy: Secondary | ICD-10-CM | POA: Diagnosis not present

## 2016-09-03 MED ORDER — FUROSEMIDE 40 MG PO TABS: 40.0000 mg | ORAL_TABLET | Freq: Two times a day (BID) | ORAL | 3 refills | Status: DC

## 2016-09-03 MED ORDER — FUROSEMIDE 40 MG PO TABS: 40.0000 mg | ORAL_TABLET | Freq: Two times a day (BID) | ORAL | 0 refills | Status: DC

## 2016-09-03 NOTE — Progress Notes (Signed)
Patient ID: Brett [name], male    DOB: 07-Mar-1935, 81 y.o.   MRN: [id number]  HPI  Mr Nordling is a 81 y/o male with a history of PVD, previous tobacco use, MR, anemia, hyperlipidemia, HTN, DM, CAD, COPD, carotid stenosis and chronic heart failure.   Last echo was done 07/11/16 which showed an EF of 55% along with mild MR, moderate TR and PA pressure of 40 mmHg.   Had a cardiac catheterization done on 07/13/16   Mid LAD to Dist LAD lesion, 40 %stenosed.  Prox Cx to Dist Cx lesion, 30 %stenosed.  Mid RCA to Dist RCA lesion, 30 %stenosed.                    Hemodynamic findings consistent with pulmonary hypertension.  Last admission was on 07/10/16 to [facility] with sepsis, pneumonia and exacerbation of chronic heart failure. Was treated with antibiotics along with IV diuresis. Was discharged on oxygen.   He presents today for a follow-up visit with fatigue with moderate exertion. Denies any shortness of breath or swelling in his legs/abdomen. Continues to weigh daily and says that his home weight ranges from 183-185 pounds. Has been out of his furosemide since yesterday. Biggest concern today is the increasing difficulty that he's having in getting up from a seated position. Able to walk without difficulty.    Past Medical History:  Diagnosis Date  . Anemia   . Aortic regurgitation   . B-cell lymphoma ([place]) 2009   DX AT [facility]  . Bronchitis   . CAD (coronary artery disease)   . Carotid stenosis   . Diabetes mellitus, type 2 ([place])   . Emphysema of lung ([place])   . Essential hypertension   . History of chemotherapy   . Hyperlipidemia   . IDA (iron deficiency anemia)   . Leg edema   . Meralgia paraesthetica   . Mitral regurgitation   . PUD (peptic ulcer disease)   . PVD (peripheral vascular disease) ([place])   . Tobacco abuse     Past Surgical History:  Procedure Laterality Date  . CARDIAC CATHETERIZATION  08/15/2007  . CARDIAC CATHETERIZATION Right 07/13/2016   Procedure:  Right/Left Heart Cath and Coronary Angiography;  Surgeon: Dionisio David, MD;  Location: [facility];  Service: Cardiovascular;  Laterality: Right;  . COLONOSCOPY  03/2013  . ESOPHAGOGASTRODUODENOSCOPY  03/2013    Family History  Problem Relation Age of Onset  . Rheum arthritis Neg Hx   . Osteoarthritis Neg Hx   . Asthma Neg Hx   . Diabetes Neg Hx   . Cancer Neg Hx     Social History  Substance Use Topics  . Smoking status: Former Smoker    Types: Cigarettes    Quit date: 08/30/2015  . Smokeless tobacco: Never Used  . Alcohol use No    No Known Allergies  Prior to Admission medications   Medication Sig Start Date End Date Taking? Authorizing Provider  amLODipine (NORVASC) 5 MG tablet Take 5 mg by mouth daily.  08/27/15  Yes Historical Provider, MD  aspirin (ADULT ASPIRIN EC LOW STRENGTH) 81 MG EC tablet Take 1 tablet by mouth daily. 09/19/15  Yes Historical Provider, MD  atorvastatin (LIPITOR) 20 MG tablet Take 20 mg by mouth daily.   Yes Historical Provider, MD  clopidogrel (PLAVIX) 75 MG tablet Take 75 mg by mouth daily.  08/27/15  Yes Historical Provider, MD  Fluticasone-Salmeterol (ADVAIR DISKUS) 250-50 MCG/DOSE AEPB Inhale 1 puff  into the lungs 2 times daily at 12 noon and 4 pm. 06/21/16  Yes [name], MD  furosemide (LASIX) 40 MG tablet Take 1 tablet (40 mg total) by mouth 2 (two) times daily. 09/03/16  Yes Alisa Graff, FNP  gabapentin (NEURONTIN) 100 MG capsule Take 1 capsule by mouth at bedtime.  09/22/15  Yes Historical Provider, MD  glimepiride (AMARYL) 1 MG tablet Take 1 mg by mouth daily with breakfast.   Yes Historical Provider, MD  hydrALAZINE (APRESOLINE) 25 MG tablet Take 2 tablets (50 mg total) by mouth 3 (three) times daily. 06/21/16  Yes [name], MD  hydrochlorothiazide (HYDRODIURIL) 25 MG tablet Take 25 mg by mouth daily. 09/16/15  Yes Historical Provider, MD  metoprolol (TOPROL-XL) 200 MG 24 hr tablet Take 1 tablet by mouth daily. 09/22/15  Yes  Historical Provider, MD  senna-docusate (SENOKOT-S) 8.6-50 MG tablet Take 1 tablet by mouth 2 (two) times daily. Patient taking differently: Take 1 tablet by mouth 2 (two) times daily as needed.  06/21/16  Yes [name], MD  tiotropium (SPIRIVA HANDIHALER) 18 MCG inhalation capsule Place 1 capsule (18 mcg total) into inhaler and inhale daily. 07/05/16  Yes Laverle Hobby, MD     Review of Systems  Constitutional: Positive for fatigue. Negative for appetite change.  HENT: Positive for hearing loss. Negative for congestion, postnasal drip and sore throat.   Eyes: Negative.   Respiratory: Negative for cough, chest tightness and shortness of breath.   Cardiovascular: Positive for leg swelling. Negative for chest pain and palpitations.  Gastrointestinal: Negative for abdominal distention and abdominal pain.  Endocrine: Negative.   Genitourinary: Negative.   Musculoskeletal: Negative for back pain and neck pain.  Skin: Negative.   Allergic/Immunologic: Negative.   Neurological: Positive for weakness (getting up from a chair). Negative for dizziness and light-headedness.  Hematological: Negative for adenopathy. Does not bruise/bleed easily.  Psychiatric/Behavioral: Positive for sleep disturbance (sleeping more during the day). Negative for dysphoric mood. The patient is not nervous/anxious.     Vitals:   09/03/16 0849  BP: (!) 117/51  Pulse: 88  Resp: 20  SpO2: 92%  Weight: 183 lb (83 kg)  Height: 5\' 10"  (1.778 m)     Physical Exam  Constitutional: He is oriented to person, place, and time. He appears well-developed and well-nourished.  HENT:  Head: Normocephalic and atraumatic.  Right Ear: Decreased hearing is noted.  Left Ear: Decreased hearing is noted.  Eyes: Conjunctivae are normal. Pupils are equal, round, and reactive to light.  Neck: Normal range of motion. Neck supple.  Cardiovascular: Normal rate and regular rhythm.   Pulmonary/Chest: Effort normal. He has no  wheezes. He has no rales.  Abdominal: Soft. He exhibits no distension. There is no tenderness.  Musculoskeletal: He exhibits edema (trace amount bilateral lower legs). He exhibits no tenderness.  Neurological: He is alert and oriented to person, place, and time.  Skin: Skin is warm and dry.  Psychiatric: He has a normal mood and affect. His behavior is normal. Thought content normal.  Nursing note and vitals reviewed.    Assessment & Plan:  1: Chronic heart failure with preserved ejection fraction- - NYHA Class II - mildly fluid overloaded today, has been out of furosemide since yesterday - weighing daily with weight ranging from 183-185 pounds. Reminded to call for an overnight weight gain of >2 pounds or a weekly weight gain of >5 pounds - 30 day furosemide RX sent to local pharmacy and 90 day RX  sent to [facility] mail order - continue elevating legs when at home - saw cardiologist 08/19/16 and has another appointment scheduled in November 2017  2: HTN-  - BP looks great today - Has recently seen his PCP  3: COPD- - Wearing oxygen at 3L just at bedtime now - No wheezing heard  4: Leg weakness- - His friend says that this has worsened over the last couple of weeks where he has difficulty in standing from seated position - Walking without difficulty - Endorses some stiffness in knees - Encouraged him to follow-up with PCP to see if he needs knee xrays or not  Return in 3 months or sooner for any questions/problems before then.

## 2016-09-03 NOTE — Patient Instructions (Signed)
Continue weighing daily and call for an overnight weight gain of > 2 pounds or a weekly weight gain of >5 pounds. 

## 2016-10-07 ENCOUNTER — Emergency Department
Admission: EM | Admit: 2016-10-07 | Discharge: 2016-10-07 | Disposition: A | Discharge status: Home / Self Care | Payer: Medicare PPO | Attending: Emergency Medicine | Admitting: Emergency Medicine

## 2016-10-07 ENCOUNTER — Encounter: Payer: Self-pay | Admitting: Emergency Medicine

## 2016-10-07 DIAGNOSIS — I2510 Atherosclerotic heart disease of native coronary artery without angina pectoris: Secondary | ICD-10-CM | POA: Diagnosis not present

## 2016-10-07 DIAGNOSIS — R42 Dizziness and giddiness: Secondary | ICD-10-CM

## 2016-10-07 DIAGNOSIS — Z7984 Long term (current) use of oral hypoglycemic drugs: Secondary | ICD-10-CM | POA: Insufficient documentation

## 2016-10-07 DIAGNOSIS — J449 Chronic obstructive pulmonary disease, unspecified: Secondary | ICD-10-CM | POA: Diagnosis not present

## 2016-10-07 DIAGNOSIS — Z79899 Other long term (current) drug therapy: Secondary | ICD-10-CM | POA: Diagnosis not present

## 2016-10-07 DIAGNOSIS — Z87891 Personal history of nicotine dependence: Secondary | ICD-10-CM | POA: Insufficient documentation

## 2016-10-07 DIAGNOSIS — R55 Syncope and collapse: Secondary | ICD-10-CM | POA: Insufficient documentation

## 2016-10-07 DIAGNOSIS — I5032 Chronic diastolic (congestive) heart failure: Secondary | ICD-10-CM | POA: Diagnosis not present

## 2016-10-07 DIAGNOSIS — I110 Hypertensive heart disease with heart failure: Secondary | ICD-10-CM | POA: Insufficient documentation

## 2016-10-07 DIAGNOSIS — Z7982 Long term (current) use of aspirin: Secondary | ICD-10-CM | POA: Insufficient documentation

## 2016-10-07 DIAGNOSIS — E119 Type 2 diabetes mellitus without complications: Secondary | ICD-10-CM | POA: Diagnosis not present

## 2016-10-07 LAB — COMPREHENSIVE METABOLIC PANEL
ALK PHOS: 76 U/L (ref 38–126)
ALT: 11 U/L — ABNORMAL LOW (ref 17–63)
ANION GAP: 15 (ref 5–15)
AST: 20 U/L (ref 15–41)
Albumin: 3.2 g/dL — ABNORMAL LOW (ref 3.5–5.0)
BILIRUBIN TOTAL: 0.3 mg/dL (ref 0.3–1.2)
BUN: 62 mg/dL — ABNORMAL HIGH (ref 6–20)
CALCIUM: 8.6 mg/dL — AB (ref 8.9–10.3)
CO2: 35 mmol/L — ABNORMAL HIGH (ref 22–32)
Chloride: 87 mmol/L — ABNORMAL LOW (ref 101–111)
Creatinine, Ser: 2.06 mg/dL — ABNORMAL HIGH (ref 0.61–1.24)
GFR, EST AFRICAN AMERICAN: 34 mL/min — AB (ref >60)
GFR, EST NON AFRICAN AMERICAN: 29 mL/min — AB (ref >60)
Glucose, Bld: 308 mg/dL — ABNORMAL HIGH (ref 65–99)
Potassium: 2.8 mmol/L — ABNORMAL LOW (ref 3.5–5.1)
SODIUM: 137 mmol/L (ref 135–145)
TOTAL PROTEIN: 8 g/dL (ref 6.5–8.1)

## 2016-10-07 LAB — CBC
HCT: 26.7 % — ABNORMAL LOW (ref 40.0–52.0)
HEMOGLOBIN: 8.3 g/dL — AB (ref 13.0–18.0)
MCH: 21.1 pg — ABNORMAL LOW (ref 26.0–34.0)
MCHC: 30.9 g/dL — ABNORMAL LOW (ref 32.0–36.0)
MCV: 68.3 fL — ABNORMAL LOW (ref 80.0–100.0)
PLATELETS: 474 10*3/uL — AB (ref 150–440)
RBC: 3.91 MIL/uL — AB (ref 4.40–5.90)
RDW: 20.7 % — ABNORMAL HIGH (ref 11.5–14.5)
WBC: 11 10*3/uL — ABNORMAL HIGH (ref 3.8–10.6)

## 2016-10-07 MED ORDER — SODIUM CHLORIDE 0.9 % IV BOLUS (SEPSIS)
1000.0000 mL | Freq: Once | INTRAVENOUS | Status: AC
  Administered 2016-10-07: 1000 mL via INTRAVENOUS

## 2016-10-07 NOTE — ED Triage Notes (Signed)
Patient sent by dr Cletis Media for low hbg--blood was drawn on Tuesday and they called to day saying to go to ED now for transfusion.  He has had some dizziness recently

## 2016-10-07 NOTE — ED Triage Notes (Deleted)
2. In watching the video [employer]- 5 levels of leadership we learn the five levels of becoming a good leader. The video explains that as we climb up these levels we grow as leaders. The levels are position, permission, production, people development and personhood.

## 2016-10-07 NOTE — Discharge Instructions (Signed)
Please continue current outpatient management and continue with your Doppler studies tomorrow. Please drink plenty of fluids and continue her current medications. Contact her primary physician to inform them of your visit here today in emergency department and that your lab work should be in the system for them to analyze.  Please return immediately if condition worsens. Please contact her primary physician or the physician you were given for referral. If you have any specialist physicians involved in her treatment and plan please also contact them. Thank you for using [facility].

## 2016-10-07 NOTE — ED Provider Notes (Signed)
Time Seen: Approximately 1519 I have reviewed the triage notes  Chief Complaint: Anemia   History of Present Illness: Brett [name] is a 81 y.o. male who was referred here for evaluation of possible anemia. Patient has history of anemia with B cell lymphoma, etc. He is currently being evaluated on an outpatient basis for near syncopal episodes which she states is been going on now for the last several months. The blood drawn on an outpatient basis on Tuesday and his neighbor was notified who is his primary caretaker at this time that his hemoglobin was 7.0 per the office and was referred here to the emergency department for further evaluation. The patient describes some lightheadedness whenever he is leaning over and then stands upright. No syncopal episodes and has carotid Doppler studies were ordered for tomorrow.  Past Medical History:  Diagnosis Date  . Anemia   . Aortic regurgitation   . 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 Bronchitis   . CAD (coronary artery disease)   . Carotid stenosis   . Diabetes mellitus, type 2 ([place])   . Emphysema of lung ([place])   . Essential hypertension   . History of chemotherapy   . Hyperlipidemia   . IDA (iron deficiency anemia)   . Leg edema   . Meralgia paraesthetica   . Mitral regurgitation   . PUD (peptic ulcer disease)   . PVD (peripheral vascular disease) ([place])   . Tobacco abuse     Patient Active Problem List   Diagnosis Date Noted  . Leg weakness, bilateral 09/03/2016  . Chronic diastolic heart failure ([place]) 08/06/2016  . Pleural effusion 08/05/2016  . Sepsis ([place]) 07/10/2016  . HTN (hypertension) 07/10/2016  . Diabetes ([place]) 07/10/2016  . CAD (coronary artery disease) 07/10/2016  . COPD (chronic obstructive pulmonary disease) ([place]) 06/20/2016  . Chest pain 06/18/2016  . B-cell lymphoma ([place]) 10/14/2015    Past Surgical History:  Procedure Laterality Date  . CARDIAC CATHETERIZATION  08/15/2007  . CARDIAC  CATHETERIZATION Right 07/13/2016   Procedure: Right/Left Heart Cath and Coronary Angiography;  Surgeon: Dionisio David, MD;  Location: [facility];  Service: Cardiovascular;  Laterality: Right;  . COLONOSCOPY  03/2013  . ESOPHAGOGASTRODUODENOSCOPY  03/2013    Past Surgical History:  Procedure Laterality Date  . CARDIAC CATHETERIZATION  08/15/2007  . CARDIAC CATHETERIZATION Right 07/13/2016   Procedure: Right/Left Heart Cath and Coronary Angiography;  Surgeon: Dionisio David, MD;  Location: [facility];  Service: Cardiovascular;  Laterality: Right;  . COLONOSCOPY  03/2013  . ESOPHAGOGASTRODUODENOSCOPY  03/2013    Current Outpatient Rx  . Order #: [id number] Class: Historical Med  . Order #: [id number] Class: Historical Med  . Order #: [id number] Class: Historical Med  . Order #: [id number] Class: Historical Med  . Order #: [id number] Class: Print  . Order #: [id number] Class: Normal  . Order #: [id number] Class: Historical Med  . Order #: [id number] Class: Historical Med  . Order #: [id number] Class: Print  . Order #: [id number] Class: Historical Med  . Order #: [id number] Class: Historical Med  . Order #: [id number] Class: OTC  . Order #: [id number] Class: Normal    Allergies:  Patient has no known allergies.  Family History: Family History  Problem Relation Age of Onset  . Rheum arthritis Neg Hx   . Osteoarthritis Neg Hx   . Asthma Neg Hx   . Diabetes Neg Hx   . Cancer Neg Hx     Social History: Social History  Substance Use Topics  . Smoking status: Former Smoker    Types: Cigarettes    Quit date: 08/30/2015  . Smokeless tobacco: Never Used  . Alcohol use No     Review of Systems:   10 point review of systems was performed and was otherwise negative:  Constitutional: No fever Eyes: No visual disturbances ENT: No sore throat, ear pain Cardiac: No chest pain Respiratory: No shortness of breath, wheezing, or stridor Abdomen: No abdominal pain, no vomiting, No  diarrhea Endocrine: No weight loss, No night sweats Extremities: No peripheral edema, cyanosis Skin: No rashes, easy bruising Neurologic: No focal weakness, trouble with speech or swollowing Urologic: No dysuria, Hematuria, or urinary frequency Patient denies any melena or hematochezia. Physical Exam:  ED Triage Vitals [10/07/16 1155]  Enc Vitals Group     BP (!) 126/45     Pulse Rate 89     Resp 16     Temp 97.4 F (36.3 C)     Temp Source Oral     SpO2 94 %     Weight 175 lb (79.4 kg)     Height 5\' 10"  (1.778 m)     Head Circumference      Peak Flow      Pain Score      Pain Loc      Pain Edu?      Excl. in [place]?     General: Awake , Alert , and Oriented times 3; GCS 15 Head: Normal cephalic , atraumatic Eyes: Pupils equal , round, reactive to light Nose/Throat: No nasal drainage, patent upper airway without erythema or exudate.  Neck: Supple, Full range of motion, No anterior adenopathy or palpable thyroid masses Lungs: Clear to ascultation without wheezes , rhonchi, or rales Heart: Regular rate, regular rhythm without murmurs , gallops , or rubs Abdomen: Soft, non tender without rebound, guarding , or rigidity; bowel sounds positive and symmetric in all 4 quadrants. No organomegaly .        Extremities: 2 plus symmetric pulses. No edema, clubbing or cyanosis Neurologic: normal ambulation, Motor symmetric without deficits, sensory intact Skin: warm, dry, no rashes   Labs:   All laboratory work was reviewed including any pertinent negatives or positives listed below:  Labs Reviewed  COMPREHENSIVE METABOLIC PANEL - Abnormal; Notable for the following:       Result Value   Potassium 2.8 (*)    Chloride 87 (*)    CO2 35 (*)    Glucose, Bld 308 (*)    BUN 62 (*)    Creatinine, Ser 2.06 (*)    Calcium 8.6 (*)    Albumin 3.2 (*)    ALT 11 (*)    GFR calc non Af Amer 29 (*)    GFR calc Af Amer 34 (*)    All other components within normal limits  CBC - Abnormal;  Notable for the following:    WBC 11.0 (*)    RBC 3.91 (*)    Hemoglobin 8.3 (*)    HCT 26.7 (*)    MCV 68.3 (*)    MCH 21.1 (*)    MCHC 30.9 (*)    RDW 20.7 (*)    Platelets 474 (*)    All other components within normal limits   ED Course:  Patient's stay here was uneventful and review of his laboratory work his hemoglobin was 8.3 which is not significantly different from his previous hemoglobins in the past. He does not have any evidence  of active bleeding and I felt a blood transfusion was not necessary at this time from a clinical standpoint. Patient does appear to be dehydrated based on his BUN and creatinine ratio and was given a fluid bolus here in the emergency department.   Clinical Course      Assessment: * Near syncope   Final Clinical Impression:   Final diagnoses:  Postural dizziness with near syncope     Plan:  Outpatient Patient was advised to return immediately if condition worsens. Patient was advised to follow up with their primary care physician or other specialized physicians involved in their outpatient care. The patient and/or family member/power of attorney had laboratory results reviewed at the bedside. All questions and concerns were addressed and appropriate discharge instructions were distributed by the nursing staff.             Daymon Larsen, MD 10/07/16 1725

## 2016-12-01 ENCOUNTER — Encounter: Payer: Self-pay | Admitting: *Deleted

## 2016-12-01 ENCOUNTER — Inpatient Hospital Stay
Admission: EM | Admit: 2016-12-01 | Discharge: 2016-12-03 | DRG: 812 | Disposition: A | Discharge status: Home Health | Payer: Medicare Other | Attending: Internal Medicine | Admitting: Internal Medicine

## 2016-12-01 ENCOUNTER — Emergency Department: Payer: Medicare Other

## 2016-12-01 DIAGNOSIS — M6281 Muscle weakness (generalized): Secondary | ICD-10-CM

## 2016-12-01 DIAGNOSIS — Z8711 Personal history of peptic ulcer disease: Secondary | ICD-10-CM | POA: Diagnosis not present

## 2016-12-01 DIAGNOSIS — I10 Essential (primary) hypertension: Secondary | ICD-10-CM | POA: Diagnosis not present

## 2016-12-01 DIAGNOSIS — E785 Hyperlipidemia, unspecified: Secondary | ICD-10-CM | POA: Diagnosis present

## 2016-12-01 DIAGNOSIS — D509 Iron deficiency anemia, unspecified: Secondary | ICD-10-CM | POA: Diagnosis not present

## 2016-12-01 DIAGNOSIS — R296 Repeated falls: Secondary | ICD-10-CM | POA: Diagnosis present

## 2016-12-01 DIAGNOSIS — I6529 Occlusion and stenosis of unspecified carotid artery: Secondary | ICD-10-CM | POA: Diagnosis not present

## 2016-12-01 DIAGNOSIS — I351 Nonrheumatic aortic (valve) insufficiency: Secondary | ICD-10-CM | POA: Diagnosis present

## 2016-12-01 DIAGNOSIS — J439 Emphysema, unspecified: Secondary | ICD-10-CM | POA: Diagnosis not present

## 2016-12-01 DIAGNOSIS — Z8572 Personal history of non-Hodgkin lymphomas: Secondary | ICD-10-CM | POA: Diagnosis not present

## 2016-12-01 DIAGNOSIS — I2510 Atherosclerotic heart disease of native coronary artery without angina pectoris: Secondary | ICD-10-CM | POA: Diagnosis present

## 2016-12-01 DIAGNOSIS — D649 Anemia, unspecified: Secondary | ICD-10-CM

## 2016-12-01 DIAGNOSIS — Z8673 Personal history of transient ischemic attack (TIA), and cerebral infarction without residual deficits: Secondary | ICD-10-CM

## 2016-12-01 DIAGNOSIS — W19XXXA Unspecified fall, initial encounter: Secondary | ICD-10-CM

## 2016-12-01 DIAGNOSIS — I340 Nonrheumatic mitral (valve) insufficiency: Secondary | ICD-10-CM | POA: Diagnosis not present

## 2016-12-01 DIAGNOSIS — R262 Difficulty in walking, not elsewhere classified: Secondary | ICD-10-CM | POA: Diagnosis not present

## 2016-12-01 DIAGNOSIS — I639 Cerebral infarction, unspecified: Secondary | ICD-10-CM | POA: Diagnosis present

## 2016-12-01 DIAGNOSIS — R29898 Other symptoms and signs involving the musculoskeletal system: Secondary | ICD-10-CM

## 2016-12-01 DIAGNOSIS — R531 Weakness: Secondary | ICD-10-CM | POA: Diagnosis present

## 2016-12-01 DIAGNOSIS — Z79899 Other long term (current) drug therapy: Secondary | ICD-10-CM

## 2016-12-01 DIAGNOSIS — Z9221 Personal history of antineoplastic chemotherapy: Secondary | ICD-10-CM | POA: Diagnosis not present

## 2016-12-01 DIAGNOSIS — Z87891 Personal history of nicotine dependence: Secondary | ICD-10-CM | POA: Diagnosis not present

## 2016-12-01 DIAGNOSIS — Z23 Encounter for immunization: Secondary | ICD-10-CM

## 2016-12-01 DIAGNOSIS — Z7902 Long term (current) use of antithrombotics/antiplatelets: Secondary | ICD-10-CM

## 2016-12-01 DIAGNOSIS — E1151 Type 2 diabetes mellitus with diabetic peripheral angiopathy without gangrene: Secondary | ICD-10-CM | POA: Diagnosis not present

## 2016-12-01 DIAGNOSIS — Z7982 Long term (current) use of aspirin: Secondary | ICD-10-CM

## 2016-12-01 LAB — CBC
HCT: 25.3 % — ABNORMAL LOW (ref 40.0–52.0)
HEMOGLOBIN: 7.7 g/dL — AB (ref 13.0–18.0)
MCH: 20.7 pg — ABNORMAL LOW (ref 26.0–34.0)
MCHC: 30.5 g/dL — AB (ref 32.0–36.0)
MCV: 68.1 fL — ABNORMAL LOW (ref 80.0–100.0)
Platelets: 375 10*3/uL (ref 150–440)
RBC: 3.72 MIL/uL — ABNORMAL LOW (ref 4.40–5.90)
RDW: 21 % — ABNORMAL HIGH (ref 11.5–14.5)
WBC: 5.6 10*3/uL (ref 3.8–10.6)

## 2016-12-01 LAB — BASIC METABOLIC PANEL
ANION GAP: 9 (ref 5–15)
BUN: 19 mg/dL (ref 6–20)
CALCIUM: 8 mg/dL — AB (ref 8.9–10.3)
CO2: 31 mmol/L (ref 22–32)
Chloride: 101 mmol/L (ref 101–111)
Creatinine, Ser: 1.64 mg/dL — ABNORMAL HIGH (ref 0.61–1.24)
GFR calc Af Amer: 44 mL/min — ABNORMAL LOW (ref >60)
GFR, EST NON AFRICAN AMERICAN: 38 mL/min — AB (ref >60)
GLUCOSE: 166 mg/dL — AB (ref 65–99)
POTASSIUM: 3.9 mmol/L (ref 3.5–5.1)
SODIUM: 141 mmol/L (ref 135–145)

## 2016-12-01 LAB — URINALYSIS, COMPLETE (UACMP) WITH MICROSCOPIC
BILIRUBIN URINE: NEGATIVE
Bacteria, UA: NONE SEEN
Glucose, UA: NEGATIVE mg/dL
HGB URINE DIPSTICK: NEGATIVE
Ketones, ur: NEGATIVE mg/dL
LEUKOCYTES UA: NEGATIVE
NITRITE: NEGATIVE
PH: 6 (ref 5.0–8.0)
Protein, ur: 100 mg/dL — AB
RBC / HPF: NONE SEEN RBC/hpf (ref 0–5)
SPECIFIC GRAVITY, URINE: 1.015 (ref 1.005–1.030)

## 2016-12-01 LAB — PREPARE RBC (CROSSMATCH)

## 2016-12-01 LAB — TROPONIN I: Troponin I: 0.03 ng/mL (ref <0.03)

## 2016-12-01 LAB — ABO/RH: ABO/RH(D): B POS

## 2016-12-01 MED ORDER — SODIUM CHLORIDE 0.9 % IV SOLN
Freq: Once | INTRAVENOUS | Status: AC
  Administered 2016-12-02: 02:00:00 via INTRAVENOUS

## 2016-12-01 NOTE — ED Notes (Signed)
Patient's friend , Claudette Head, is his contact for medical updates.  [name] can be reached on his (c) [phone] or (h) [phone].

## 2016-12-01 NOTE — ED Triage Notes (Addendum)
Pt brought in with friend who reports pt has had frequent falls over the past 5 days, pt denies hitting head or LOC, bilateral feet increased swelling, pt is pale in color, pt reports being sore all over from the frequent falls, pt has bruise over entire body from falling, pt reports not remembering all the falls

## 2016-12-01 NOTE — ED Notes (Signed)
Lab called and informed of the add on troponin.  Verbalized understanding of this information.

## 2016-12-01 NOTE — ED Provider Notes (Signed)
[facility] Emergency Department Provider Note  Time seen: 7:59 PM  I have reviewed the triage vital signs and the nursing notes.   HISTORY  Chief Complaint Fall    HPI Brett [name] is a 82 y.o. male with a past medical history of anemia, diabetes, hypertension, peripheral edema,who presents to the emergency department for generalized weakness. According to the family friend who checks on the patient, the patient lives alone however over the past 2-3 days he has fallen 8 times. The friend states this is extremely abnormal the patient does not typically fall. He is also notes that the patient has become more sluggish. Patient states he has been feeling weak over the past several days, states especially weakness in his right lower extremity which he states has been ongoing for the past one month but worse over the past several days. Patient now having difficulty ambulating. The friend states tonight he attempted to get the patient up was unable to get the patient out of bed so he brought the patient to the emergency department for evaluation. Patient does not recall the falls. Patient denies any pain or discomfort at this time.   Past Medical History:  Diagnosis Date  . Anemia   . Aortic regurgitation   . 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 Bronchitis   . CAD (coronary artery disease)   . Carotid stenosis   . Diabetes mellitus, type 2 ([place])   . Emphysema of lung ([place])   . Essential hypertension   . History of chemotherapy   . Hyperlipidemia   . IDA (iron deficiency anemia)   . Leg edema   . Meralgia paraesthetica   . Mitral regurgitation   . PUD (peptic ulcer disease)   . PVD (peripheral vascular disease) ([place])   . Tobacco abuse     Patient Active Problem List   Diagnosis Date Noted  . Leg weakness, bilateral 09/03/2016  . Chronic diastolic heart failure ([place]) 08/06/2016  . Pleural effusion 08/05/2016  . Sepsis ([place]) 07/10/2016  . HTN  (hypertension) 07/10/2016  . Diabetes ([place]) 07/10/2016  . CAD (coronary artery disease) 07/10/2016  . COPD (chronic obstructive pulmonary disease) ([place]) 06/20/2016  . Chest pain 06/18/2016  . B-cell lymphoma ([place]) 10/14/2015    Past Surgical History:  Procedure Laterality Date  . CARDIAC CATHETERIZATION  08/15/2007  . CARDIAC CATHETERIZATION Right 07/13/2016   Procedure: Right/Left Heart Cath and Coronary Angiography;  Surgeon: Dionisio David, MD;  Location: [facility];  Service: Cardiovascular;  Laterality: Right;  . COLONOSCOPY  03/2013  . ESOPHAGOGASTRODUODENOSCOPY  03/2013    Prior to Admission medications   Medication Sig Start Date End Date Taking? Authorizing Provider  amLODipine (NORVASC) 5 MG tablet Take 5 mg by mouth daily.  08/27/15   Historical Provider, MD  aspirin (ADULT ASPIRIN EC LOW STRENGTH) 81 MG EC tablet Take 1 tablet by mouth daily. 09/19/15   Historical Provider, MD  atorvastatin (LIPITOR) 20 MG tablet Take 20 mg by mouth daily.    Historical Provider, MD  clopidogrel (PLAVIX) 75 MG tablet Take 75 mg by mouth daily.  08/27/15   Historical Provider, MD  Fluticasone-Salmeterol (ADVAIR DISKUS) 250-50 MCG/DOSE AEPB Inhale 1 puff into the lungs 2 times daily at 12 noon and 4 pm. 06/21/16   [name], MD  furosemide (LASIX) 40 MG tablet Take 1 tablet (40 mg total) by mouth 2 (two) times daily. 09/03/16   Alisa Graff, FNP  gabapentin (NEURONTIN) 100 MG capsule Take 1 capsule by mouth at bedtime.  09/22/15   Historical Provider, MD  glimepiride (AMARYL) 1 MG tablet Take 1 mg by mouth daily with breakfast.    Historical Provider, MD  hydrALAZINE (APRESOLINE) 25 MG tablet Take 2 tablets (50 mg total) by mouth 3 (three) times daily. 06/21/16   [name], MD  hydrochlorothiazide (HYDRODIURIL) 25 MG tablet Take 25 mg by mouth daily. 09/16/15   Historical Provider, MD  metoprolol (TOPROL-XL) 200 MG 24 hr tablet Take 1 tablet by mouth daily. 09/22/15   Historical  Provider, MD  senna-docusate (SENOKOT-S) 8.6-50 MG tablet Take 1 tablet by mouth 2 (two) times daily. Patient taking differently: Take 1 tablet by mouth 2 (two) times daily as needed.  06/21/16   [name], MD  tiotropium (SPIRIVA HANDIHALER) 18 MCG inhalation capsule Place 1 capsule (18 mcg total) into inhaler and inhale daily. 07/05/16   Laverle Hobby, MD    No Known Allergies  Family History  Problem Relation Age of Onset  . Rheum arthritis Neg Hx   . Osteoarthritis Neg Hx   . Asthma Neg Hx   . Diabetes Neg Hx   . Cancer Neg Hx     Social History Social History  Substance Use Topics  . Smoking status: Former Smoker    Types: Cigarettes    Quit date: 08/30/2015  . Smokeless tobacco: Never Used  . Alcohol use No    Review of Systems Constitutional: Negative for fever. Cardiovascular: Negative for chest pain. Respiratory: Negative for shortness of breath. Gastrointestinal: Negative for abdominal pain Musculoskeletal: Bruises over her arms and legs. Neurological: Generalized weakness. Increased right lower extremity weakness. 10-point ROS otherwise negative.  ____________________________________________   PHYSICAL EXAM:  VITAL SIGNS: ED Triage Vitals  Enc Vitals Group     BP 12/01/16 1740 (!) 131/59     Pulse Rate 12/01/16 1740 (!) 112     Resp 12/01/16 1740 18     Temp 12/01/16 1740 97.3 F (36.3 C)     Temp Source 12/01/16 1740 Oral     SpO2 12/01/16 1740 (!) 87 %     Weight 12/01/16 1740 176 lb (79.8 kg)     Height 12/01/16 1740 5\' 10"  (1.778 m)     Head Circumference --      Peak Flow --      Pain Score 12/01/16 1748 4     Pain Loc --      Pain Edu? --      Excl. in [place]? --     Constitutional: Alert and oriented. Somnolent, sluggish responses. Hard of hearing. Pale in color. Eyes: Normal exam ENT   Head: Normocephalic and atraumatic.   Mouth/Throat: Mucous membranes are moist. Cardiovascular: Normal rate, regular rhythm around 90-1 100  bpm. Respiratory: Normal respiratory effort without tachypnea nor retractions. Breath sounds are clear Gastrointestinal: Soft and nontender. No distention. Musculoskeletal: Nontender with normal range of motion in all extremities.  Neurologic:  Patient has generalized weakness however significant weakness noted in the right lower extremity 2/5, unable to lift off of the bed against gravity. 4/5 motor in left lower extremity. 4+/5 motor bilaterally in the upper extremities. No cranial nerve deficit. Skin:  Skin is warm, dry and intact.  Psychiatric: Mood and affect are normal.   ____________________________________________    EKG  EKG reviewed and interpreted by myself shows sinus tachycardia 111 bpm, narrow QRS, normal axis, normal intervals, nonspecific but no concerning ST changes.  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 No acute infarct.  Chest x-ray is negative for opacity, possible mild CHF.  ____________________________________________   INITIAL IMPRESSION / ASSESSMENT AND PLAN / ED COURSE  Pertinent labs & imaging results that were available during my care of the patient were reviewed by me and considered in my medical decision making (see chart for details).  Patient presents to the emergency department generalized weakness with multiple recent falls. Patient has significant weakness especially in the right lower extremity. CT does not appear to show any acute infarct. Labs show hemoglobin of 7.7. We'll perform a rectal examination. Patient's typical hemoglobin is around 8.5-9. Urinalysis and chest x-ray pending.  Rectal examination shows guaiac negative stool. We will order a transfusion for the patient. Patient's urinalysis is negative. Given the patient's increased weakness multiple falls and right lower extremity weakness patient will be admitted to the hospital for further treatment.  Transfusion pending.  ____________________________________________   FINAL CLINICAL IMPRESSION(S) / ED DIAGNOSES  Generalized weakness Right lower extremity weakness. Symptomatic anemia.    Harvest Dark, MD 12/01/16 2232

## 2016-12-02 ENCOUNTER — Inpatient Hospital Stay: Payer: Medicare Other

## 2016-12-02 DIAGNOSIS — W19XXXA Unspecified fall, initial encounter: Secondary | ICD-10-CM | POA: Diagnosis not present

## 2016-12-02 DIAGNOSIS — D509 Iron deficiency anemia, unspecified: Secondary | ICD-10-CM | POA: Diagnosis not present

## 2016-12-02 DIAGNOSIS — Z8711 Personal history of peptic ulcer disease: Secondary | ICD-10-CM | POA: Diagnosis not present

## 2016-12-02 DIAGNOSIS — I351 Nonrheumatic aortic (valve) insufficiency: Secondary | ICD-10-CM | POA: Diagnosis not present

## 2016-12-02 DIAGNOSIS — I340 Nonrheumatic mitral (valve) insufficiency: Secondary | ICD-10-CM | POA: Diagnosis not present

## 2016-12-02 DIAGNOSIS — R296 Repeated falls: Secondary | ICD-10-CM | POA: Diagnosis not present

## 2016-12-02 DIAGNOSIS — E1151 Type 2 diabetes mellitus with diabetic peripheral angiopathy without gangrene: Secondary | ICD-10-CM | POA: Diagnosis not present

## 2016-12-02 DIAGNOSIS — R531 Weakness: Secondary | ICD-10-CM

## 2016-12-02 DIAGNOSIS — Z8673 Personal history of transient ischemic attack (TIA), and cerebral infarction without residual deficits: Secondary | ICD-10-CM | POA: Diagnosis not present

## 2016-12-02 DIAGNOSIS — I639 Cerebral infarction, unspecified: Secondary | ICD-10-CM

## 2016-12-02 DIAGNOSIS — I10 Essential (primary) hypertension: Secondary | ICD-10-CM | POA: Diagnosis not present

## 2016-12-02 DIAGNOSIS — Z23 Encounter for immunization: Secondary | ICD-10-CM | POA: Diagnosis not present

## 2016-12-02 DIAGNOSIS — J439 Emphysema, unspecified: Secondary | ICD-10-CM | POA: Diagnosis not present

## 2016-12-02 DIAGNOSIS — I2510 Atherosclerotic heart disease of native coronary artery without angina pectoris: Secondary | ICD-10-CM | POA: Diagnosis not present

## 2016-12-02 DIAGNOSIS — E785 Hyperlipidemia, unspecified: Secondary | ICD-10-CM | POA: Diagnosis not present

## 2016-12-02 DIAGNOSIS — Z8572 Personal history of non-Hodgkin lymphomas: Secondary | ICD-10-CM | POA: Diagnosis not present

## 2016-12-02 DIAGNOSIS — Z87891 Personal history of nicotine dependence: Secondary | ICD-10-CM | POA: Diagnosis not present

## 2016-12-02 DIAGNOSIS — I6529 Occlusion and stenosis of unspecified carotid artery: Secondary | ICD-10-CM | POA: Diagnosis not present

## 2016-12-02 DIAGNOSIS — R262 Difficulty in walking, not elsewhere classified: Secondary | ICD-10-CM | POA: Diagnosis not present

## 2016-12-02 DIAGNOSIS — Z9221 Personal history of antineoplastic chemotherapy: Secondary | ICD-10-CM | POA: Diagnosis not present

## 2016-12-02 LAB — IRON AND TIBC
Iron: 21 ug/dL — ABNORMAL LOW (ref 45–182)
SATURATION RATIOS: 9 % — AB (ref 17.9–39.5)
TIBC: 245 ug/dL — ABNORMAL LOW (ref 250–450)
UIBC: 224 ug/dL

## 2016-12-02 LAB — CBC WITH DIFFERENTIAL/PLATELET
BASOS ABS: 0 10*3/uL (ref 0–0.1)
Basophils Relative: 0 %
EOS PCT: 15 %
Eosinophils Absolute: 0.8 10*3/uL — ABNORMAL HIGH (ref 0–0.7)
HCT: 26 % — ABNORMAL LOW (ref 40.0–52.0)
Hemoglobin: 8.1 g/dL — ABNORMAL LOW (ref 13.0–18.0)
LYMPHS ABS: 0.6 10*3/uL — AB (ref 1.0–3.6)
LYMPHS PCT: 10 %
MCH: 21.5 pg — AB (ref 26.0–34.0)
MCHC: 31.2 g/dL — ABNORMAL LOW (ref 32.0–36.0)
MCV: 68.8 fL — AB (ref 80.0–100.0)
Monocytes Absolute: 0.4 10*3/uL (ref 0.2–1.0)
Monocytes Relative: 7 %
NEUTROS ABS: 4 10*3/uL (ref 1.4–6.5)
NEUTROS PCT: 68 %
PLATELETS: 354 10*3/uL (ref 150–440)
RBC: 3.78 MIL/uL — AB (ref 4.40–5.90)
RDW: 21 % — ABNORMAL HIGH (ref 11.5–14.5)
WBC: 5.8 10*3/uL (ref 3.8–10.6)

## 2016-12-02 LAB — BASIC METABOLIC PANEL
ANION GAP: 5 (ref 5–15)
BUN: 17 mg/dL (ref 6–20)
CHLORIDE: 103 mmol/L (ref 101–111)
CO2: 32 mmol/L (ref 22–32)
CREATININE: 1.54 mg/dL — AB (ref 0.61–1.24)
Calcium: 7.6 mg/dL — ABNORMAL LOW (ref 8.9–10.3)
GFR calc non Af Amer: 41 mL/min — ABNORMAL LOW (ref >60)
GFR, EST AFRICAN AMERICAN: 48 mL/min — AB (ref >60)
Glucose, Bld: 123 mg/dL — ABNORMAL HIGH (ref 65–99)
Potassium: 3.6 mmol/L (ref 3.5–5.1)
SODIUM: 140 mmol/L (ref 135–145)

## 2016-12-02 LAB — LIPID PANEL
CHOLESTEROL: 114 mg/dL (ref 0–200)
HDL: 22 mg/dL — ABNORMAL LOW (ref >40)
LDL CALC: 55 mg/dL (ref 0–99)
TRIGLYCERIDES: 187 mg/dL — AB (ref <150)
Total CHOL/HDL Ratio: 5.2 RATIO
VLDL: 37 mg/dL (ref 0–40)

## 2016-12-02 LAB — GLUCOSE, CAPILLARY
GLUCOSE-CAPILLARY: 162 mg/dL — AB (ref 65–99)
Glucose-Capillary: 108 mg/dL — ABNORMAL HIGH (ref 65–99)
Glucose-Capillary: 130 mg/dL — ABNORMAL HIGH (ref 65–99)
Glucose-Capillary: 148 mg/dL — ABNORMAL HIGH (ref 65–99)

## 2016-12-02 MED ORDER — SODIUM CHLORIDE 0.9% FLUSH
3.0000 mL | Freq: Two times a day (BID) | INTRAVENOUS | Status: DC
  Administered 2016-12-02 – 2016-12-03 (×2): 3 mL via INTRAVENOUS

## 2016-12-02 MED ORDER — INFLUENZA VAC SPLIT QUAD 0.5 ML IM SUSY
0.5000 mL | PREFILLED_SYRINGE | INTRAMUSCULAR | Status: AC
  Administered 2016-12-03: 11:00:00 0.5 mL via INTRAMUSCULAR
  Filled 2016-12-02: qty 0.5

## 2016-12-02 MED ORDER — GLIMEPIRIDE 2 MG PO TABS
1.0000 mg | ORAL_TABLET | Freq: Every day | ORAL | Status: DC
Start: 2016-12-02 — End: 2016-12-03
  Administered 2016-12-02 – 2016-12-03 (×2): 1 mg via ORAL
  Filled 2016-12-02 (×2): qty 1

## 2016-12-02 MED ORDER — FUROSEMIDE 40 MG PO TABS
40.0000 mg | ORAL_TABLET | Freq: Two times a day (BID) | ORAL | Status: DC
  Administered 2016-12-02 – 2016-12-03 (×3): 40 mg via ORAL
  Filled 2016-12-02 (×3): qty 1

## 2016-12-02 MED ORDER — TIOTROPIUM BROMIDE MONOHYDRATE 18 MCG IN CAPS
18.0000 ug | ORAL_CAPSULE | Freq: Every day | RESPIRATORY_TRACT | Status: DC
Start: 2016-12-02 — End: 2016-12-03
  Administered 2016-12-02 – 2016-12-03 (×2): 18 ug via RESPIRATORY_TRACT
  Filled 2016-12-02: qty 5

## 2016-12-02 MED ORDER — CLOPIDOGREL BISULFATE 75 MG PO TABS
75.0000 mg | ORAL_TABLET | Freq: Every day | ORAL | Status: DC
  Administered 2016-12-02 – 2016-12-03 (×2): 75 mg via ORAL
  Filled 2016-12-02 (×2): qty 1

## 2016-12-02 MED ORDER — METOPROLOL SUCCINATE ER 100 MG PO TB24
200.0000 mg | ORAL_TABLET | Freq: Every day | ORAL | Status: DC
  Administered 2016-12-02 – 2016-12-03 (×2): 200 mg via ORAL
  Filled 2016-12-02: qty 8

## 2016-12-02 MED ORDER — INSULIN ASPART 100 UNIT/ML ~~LOC~~ SOLN
0.0000 [IU] | Freq: Three times a day (TID) | SUBCUTANEOUS | Status: DC
  Administered 2016-12-02: 1 [IU] via SUBCUTANEOUS
  Administered 2016-12-02: 13:00:00 2 [IU] via SUBCUTANEOUS
  Filled 2016-12-02: qty 2

## 2016-12-02 MED ORDER — ENOXAPARIN SODIUM 40 MG/0.4ML ~~LOC~~ SOLN
40.0000 mg | SUBCUTANEOUS | Status: DC
  Administered 2016-12-02: 40 mg via SUBCUTANEOUS
  Filled 2016-12-02: qty 0.4

## 2016-12-02 MED ORDER — HEPARIN SODIUM (PORCINE) 5000 UNIT/ML IJ SOLN
5000.0000 [IU] | Freq: Three times a day (TID) | INTRAMUSCULAR | Status: DC
  Administered 2016-12-02: 5000 [IU] via SUBCUTANEOUS
  Filled 2016-12-02: qty 1

## 2016-12-02 MED ORDER — IRON SUCROSE 20 MG/ML IV SOLN
500.0000 mg | Freq: Once | INTRAVENOUS | Status: DC
  Filled 2016-12-02: qty 25

## 2016-12-02 MED ORDER — PNEUMOCOCCAL VAC POLYVALENT 25 MCG/0.5ML IJ INJ
0.5000 mL | INJECTION | INTRAMUSCULAR | Status: AC
  Administered 2016-12-03: 11:00:00 0.5 mL via INTRAMUSCULAR
  Filled 2016-12-02: qty 0.5

## 2016-12-02 MED ORDER — GABAPENTIN 100 MG PO CAPS
100.0000 mg | ORAL_CAPSULE | Freq: Every day | ORAL | Status: DC
  Administered 2016-12-02: 22:00:00 100 mg via ORAL
  Filled 2016-12-02: qty 1

## 2016-12-02 MED ORDER — SODIUM CHLORIDE 0.9 % IV SOLN
500.0000 mg | Freq: Once | INTRAVENOUS | Status: AC
  Administered 2016-12-02: 20:00:00 500 mg via INTRAVENOUS
  Filled 2016-12-02 (×2): qty 25

## 2016-12-02 MED ORDER — ATORVASTATIN CALCIUM 20 MG PO TABS
40.0000 mg | ORAL_TABLET | Freq: Every day | ORAL | Status: DC
  Administered 2016-12-02 – 2016-12-03 (×2): 40 mg via ORAL
  Filled 2016-12-02 (×2): qty 2

## 2016-12-02 MED ORDER — SENNOSIDES-DOCUSATE SODIUM 8.6-50 MG PO TABS
1.0000 | ORAL_TABLET | Freq: Two times a day (BID) | ORAL | Status: DC | PRN
  Administered 2016-12-03: 1 via ORAL
  Filled 2016-12-02: qty 1

## 2016-12-02 MED ORDER — ASPIRIN EC 81 MG PO TBEC
81.0000 mg | DELAYED_RELEASE_TABLET | Freq: Every day | ORAL | Status: DC
  Administered 2016-12-02 – 2016-12-03 (×2): 81 mg via ORAL
  Filled 2016-12-02 (×2): qty 1

## 2016-12-02 MED ORDER — MOMETASONE FURO-FORMOTEROL FUM 200-5 MCG/ACT IN AERO
2.0000 | INHALATION_SPRAY | Freq: Two times a day (BID) | RESPIRATORY_TRACT | Status: DC
  Administered 2016-12-02 – 2016-12-03 (×3): 2 via RESPIRATORY_TRACT
  Filled 2016-12-02: qty 8.8

## 2016-12-02 MED ORDER — HYDRALAZINE HCL 50 MG PO TABS
50.0000 mg | ORAL_TABLET | Freq: Three times a day (TID) | ORAL | Status: DC
  Administered 2016-12-02 – 2016-12-03 (×4): 50 mg via ORAL
  Filled 2016-12-02 (×4): qty 1

## 2016-12-02 MED ORDER — AMLODIPINE BESYLATE 5 MG PO TABS
5.0000 mg | ORAL_TABLET | Freq: Every day | ORAL | Status: DC
  Administered 2016-12-02 – 2016-12-03 (×2): 5 mg via ORAL
  Filled 2016-12-02 (×2): qty 1

## 2016-12-02 NOTE — Consult Note (Signed)
Reason for Consult:Falls  Referring Physician: Posey Pronto  CC: Falls  HPI: Brett [name] is an 82 y.o. male with multiple medical problems admitted secondary to multiple falls.  Patient reports that he has had more falls than usual for the past week.  Prior to a week ago he was not having any falls.  Reports no dizziness or vertigo and no loss of consciousness.  For two of the falls he reports that he got tangled in his O2 cord.  For one of the falls he reports getting his feet crossed up in the bathroom.  With the last fall in his kitchen he reports that he was unable to get up.  Per report of the chart he was noted to have some right sided weakness.    Past Medical History:  Diagnosis Date  . Anemia   . Aortic regurgitation   . B-cell lymphoma ([place]) 2009   DX AT [facility]  . Bronchitis   . CAD (coronary artery disease)   . Carotid stenosis   . Diabetes mellitus, type 2 ([place])   . Emphysema of lung ([place])   . Essential hypertension   . History of chemotherapy   . Hyperlipidemia   . IDA (iron deficiency anemia)   . Leg edema   . Meralgia paraesthetica   . Mitral regurgitation   . PUD (peptic ulcer disease)   . PVD (peripheral vascular disease) ([place])   . Tobacco abuse     Past Surgical History:  Procedure Laterality Date  . CARDIAC CATHETERIZATION  08/15/2007  . CARDIAC CATHETERIZATION Right 07/13/2016   Procedure: Right/Left Heart Cath and Coronary Angiography;  Surgeon: Dionisio David, MD;  Location: [facility];  Service: Cardiovascular;  Laterality: Right;  . COLONOSCOPY  03/2013  . ESOPHAGOGASTRODUODENOSCOPY  03/2013    Family History  Problem Relation Age of Onset  . Rheum arthritis Neg Hx   . Osteoarthritis Neg Hx   . Asthma Neg Hx   . Diabetes Neg Hx   . Cancer Neg Hx     Social History:  reports that he quit smoking about 15 months ago. His smoking use included Cigarettes. He has never used smokeless tobacco. He reports that he does not drink alcohol or use  drugs.  No Known Allergies  Medications:  I have reviewed the patient's current medications. Prior to Admission:  Prescriptions Prior to Admission  Medication Sig Dispense Refill Last Dose  . amLODipine (NORVASC) 5 MG tablet Take 5 mg by mouth daily.    12/01/2016 at Unknown time  . aspirin (ADULT ASPIRIN EC LOW STRENGTH) 81 MG EC tablet Take 1 tablet by mouth daily.   12/01/2016 at Unknown time  . atorvastatin (LIPITOR) 20 MG tablet Take 20 mg by mouth daily.   11/30/2016 at Unknown time  . clopidogrel (PLAVIX) 75 MG tablet Take 75 mg by mouth daily.    12/01/2016 at Unknown time  . Fluticasone-Salmeterol (ADVAIR DISKUS) 250-50 MCG/DOSE AEPB Inhale 1 puff into the lungs 2 times daily at 12 noon and 4 pm. 1 each 0 12/01/2016 at Unknown time  . furosemide (LASIX) 40 MG tablet Take 1 tablet (40 mg total) by mouth 2 (two) times daily. 180 tablet 3 12/01/2016 at Unknown time  . gabapentin (NEURONTIN) 100 MG capsule Take 1 capsule by mouth at bedtime.    11/30/2016 at Unknown time  . glimepiride (AMARYL) 1 MG tablet Take 1 mg by mouth daily with breakfast.   12/01/2016 at Unknown time  . hydrALAZINE (  APRESOLINE) 25 MG tablet Take 2 tablets (50 mg total) by mouth 3 (three) times daily. 90 tablet 0 12/01/2016 at Unknown time  . hydrochlorothiazide (HYDRODIURIL) 25 MG tablet Take 25 mg by mouth daily.  3 12/01/2016 at Unknown time  . metoprolol (TOPROL-XL) 200 MG 24 hr tablet Take 1 tablet by mouth daily.   12/01/2016 at Unknown time  . senna-docusate (SENOKOT-S) 8.6-50 MG tablet Take 1 tablet by mouth 2 (two) times daily. (Patient taking differently: Take 1 tablet by mouth 2 (two) times daily as needed. )   Taking  . tiotropium (SPIRIVA HANDIHALER) 18 MCG inhalation capsule Place 1 capsule (18 mcg total) into inhaler and inhale daily. 90 capsule 3 12/01/2016 at Unknown time   Scheduled: . amLODipine  5 mg Oral Daily  . aspirin EC  81 mg Oral Daily  . atorvastatin  40 mg Oral Daily  . clopidogrel  75 mg Oral Daily  .  furosemide  40 mg Oral BID  . gabapentin  100 mg Oral QHS  . glimepiride  1 mg Oral Q breakfast  . heparin  5,000 Units Subcutaneous Q8H  . hydrALAZINE  50 mg Oral TID  . insulin aspart  0-9 Units Subcutaneous TID WC  . metoprolol  200 mg Oral Daily  . mometasone-formoterol  2 puff Inhalation BID  . sodium chloride flush  3 mL Intravenous Q12H  . tiotropium  18 mcg Inhalation Daily    ROS: History obtained from the patient  General ROS: negative for - chills, fatigue, fever, night sweats, weight gain or weight loss Psychological ROS: negative for - behavioral disorder, hallucinations, memory difficulties, mood swings or suicidal ideation Ophthalmic ROS: negative for - blurry vision, double vision, eye pain or loss of vision ENT ROS: HOH Allergy and Immunology ROS: negative for - hives or itchy/watery eyes Hematological and Lymphatic ROS: negative for - bleeding problems, bruising or swollen lymph nodes Endocrine ROS: negative for - galactorrhea, hair pattern changes, polydipsia/polyuria or temperature intolerance Respiratory ROS: negative for - cough, hemoptysis, shortness of breath or wheezing Cardiovascular ROS: negative for - chest pain, dyspnea on exertion, edema or irregular heartbeat Gastrointestinal ROS: negative for - abdominal pain, diarrhea, hematemesis, nausea/vomiting or stool incontinence Genito-Urinary ROS: negative for - dysuria, hematuria, incontinence or urinary frequency/urgency Musculoskeletal ROS: negative for - joint swelling or muscular weakness Neurological ROS: as noted in HPI Dermatological ROS: negative for rash and skin lesion changes  Physical Examination: Blood pressure (!) 149/82, pulse 99, temperature 98.5 F (36.9 C), temperature source Oral, resp. rate 18, height 5\' 10"  (1.778 m), weight 83.1 kg (183 lb 4.8 oz), SpO2 96 %.  HEENT-  Normocephalic, no lesions, without obvious abnormality.  Normal external eye and conjunctiva.  Normal TM's bilaterally.   Normal auditory canals and external ears. Normal external nose, mucus membranes and septum.  Normal pharynx. Cardiovascular- S1, S2 normal, pulses palpable throughout   Lungs- chest clear, no wheezing, rales, normal symmetric air entry Abdomen- soft, non-tender; bowel sounds normal; no masses,  no organomegaly Extremities- mild lower extremity edema Lymph-no adenopathy palpable Musculoskeletal-no joint tenderness, deformity or swelling Skin-warm and dry, no hyperpigmentation, vitiligo, or suspicious lesions  Neurological Examination Mental Status: Alert, oriented, thought content appropriate.  Speech fluent without evidence of aphasia.  Able to follow 3 step commands without difficulty. Cranial Nerves: II: Discs flat bilaterally; Visual fields grossly normal, pupils equal, round, reactive to light and accommodation III,IV, VI: ptosis not present, extra-ocular motions intact bilaterally V,VII: smile symmetric, facial light touch  sensation normal bilaterally VIII: hearing decreased bilaterally IX,X: gag reflex present XI: bilateral shoulder shrug XII: midline tongue extension Motor: Right : Upper extremity   5/5    Left:     Upper extremity   5/5  Lower extremity   5/5     Lower extremity   5/5 Tone and bulk:normal tone throughout; no atrophy noted Sensory: Pinprick and light touch intact throughout, bilaterally Deep Tendon Reflexes: 2+ in the upper extremities, 1+ at the knees and absent at the ankles Plantars: Right: downgoing   Left: downgoing Cerebellar: Normal finger-to-nose and normal heel-to-shin testing bilaterally Gait: not tested due to safety concerns    Laboratory Studies:   Basic Metabolic Panel:  Recent Labs Lab 12/01/16 1749 12/02/16 0310  NA 141 140  K 3.9 3.6  CL 101 103  CO2 31 32  GLUCOSE 166* 123*  BUN 19 17  CREATININE 1.64* 1.54*  CALCIUM 8.0* 7.6*    Liver Function Tests: No results for input(s): AST, ALT, ALKPHOS, BILITOT, PROT, ALBUMIN in the  last 168 hours. No results for input(s): LIPASE, AMYLASE in the last 168 hours. No results for input(s): AMMONIA in the last 168 hours.  CBC:  Recent Labs Lab 12/01/16 1749  WBC 5.6  HGB 7.7*  HCT 25.3*  MCV 68.1*  PLT 375    Cardiac Enzymes:  Recent Labs Lab 12/01/16 1749  TROPONINI 0.03*    BNP: Invalid input(s): POCBNP  CBG:  Recent Labs Lab 12/02/16 0727 12/02/16 1153  GLUCAP 108* 162*    Microbiology: Results for orders placed or performed during the hospital encounter of 07/10/16  Blood culture (routine x 2)     Status: None   Collection Time: 07/10/16 11:05 PM  Result Value Ref Range Status   Specimen Description BLOOD RIGHT ASSIST CONTROL  Final   Special Requests   Final    BOTTLES DRAWN AEROBIC AND ANAEROBIC [place]   Culture NO GROWTH 5 DAYS  Final   Report Status 07/15/2016 FINAL  Final  Blood culture (routine x 2)     Status: None   Collection Time: 07/10/16 11:08 PM  Result Value Ref Range Status   Specimen Description BLOOD RIGHT HAND  Final   Special Requests   Final    BOTTLES DRAWN AEROBIC AND ANAEROBIC 14CCAERO,11CCANA   Culture NO GROWTH 5 DAYS  Final   Report Status 07/15/2016 FINAL  Final  MRSA PCR Screening     Status: None   Collection Time: 07/11/16  7:43 AM  Result Value Ref Range Status   MRSA by PCR NEGATIVE NEGATIVE Final    Comment:        The GeneXpert MRSA Assay (FDA approved for NASAL specimens only), is one component of a comprehensive MRSA colonization surveillance program. It is not intended to diagnose MRSA infection nor to guide or monitor treatment for MRSA infections.     Coagulation Studies: No results for input(s): LABPROT, INR in the last 72 hours.  Urinalysis:  Recent Labs Lab 12/01/16 2132  COLORURINE YELLOW*  LABSPEC 1.015  PHURINE 6.0  GLUCOSEU NEGATIVE  HGBUR NEGATIVE  BILIRUBINUR NEGATIVE  KETONESUR NEGATIVE  PROTEINUR 100*  NITRITE NEGATIVE  LEUKOCYTESUR NEGATIVE     Lipid Panel:     Component Value Date/Time   CHOL 114 12/02/2016 0310   TRIG 187 (H) 12/02/2016 0310   HDL 22 (L) 12/02/2016 0310   CHOLHDL 5.2 12/02/2016 0310   VLDL 37 12/02/2016 0310   LDLCALC 55 12/02/2016 0310  HgbA1C:  Lab Results  Component Value Date   HGBA1C 7.7 (H) 06/20/2016    Urine Drug Screen:  No results found for: LABOPIA, COCAINSCRNUR, LABBENZ, AMPHETMU, THCU, LABBARB  Alcohol Level: No results for input(s): ETH in the last 168 hours.  Other results: EKG: sinus tachycardia at 111bpm.  Imaging: Dg Chest 2 View  Result Date: 12/01/2016 CLINICAL DATA:  Repeated falls over the past 5 days.  Weakness. EXAM: CHEST  2 VIEW COMPARISON:  08/12/2016 CXR FINDINGS: Stable cardiomegaly with aortic atherosclerosis. Mild faint airspace opacities bilaterally which may reflect sequela of CHF. Trace left effusion with left basilar atelectasis. Calcific bursitis versus rotator cuff tendinopathy on the left. Mild AC joint osteoarthritis. IMPRESSION: Stable cardiomegaly with aortic atherosclerosis. Faint airspace opacities bilaterally with small left effusion may reflect sequela of CHF. Electronically Signed   By: Ashley Royalty M.D.   On: 12/01/2016 20:33   Ct Head Wo Contrast  Result Date: 12/01/2016 CLINICAL DATA:  Frequent falls over the past 5 days. Denies hitting head or loss consciousness. : EXAM: CT HEAD WITHOUT CONTRAST TECHNIQUE: Contiguous axial images were obtained from the base of the skull through the vertex without intravenous contrast. COMPARISON:  None. FINDINGS: Brain: There is mild superficial and central atrophy. Chronic appearing small vessel ischemic changes periventricular and subcortical white matter. Chronic small left cerebellar infarct. No intra-axial mass nor extra-axial fluid collections. Vascular: Moderate carotid siphon calcifications bilaterally. Skull: No acute osseous abnormality. Sinuses/Orbits: Symmetric appearing orbits and globes. Right lens  replacement surgery. The visualized paranasal sinuses and mastoids are clear and without acute appearing abnormality. Other: None IMPRESSION: Mild superficial and central atrophy with chronic appearing small vessel ischemic disease of periventricular and subcortical white matter. Chronic appearing left tiny cerebellar infarct. No acute intracranial abnormality. Electronically Signed   By: Ashley Royalty M.D.   On: 12/01/2016 18:38   Mr Brain Wo Contrast  Result Date: 12/02/2016 CLINICAL DATA:  Unstable balance. Multiple falls. History of diabetes hyperlipidemia vascular disease. EXAM: MRI HEAD WITHOUT CONTRAST TECHNIQUE: Multiplanar, multiecho pulse sequences of the brain and surrounding structures were obtained without intravenous contrast. COMPARISON:  CT head 12/01/2016 FINDINGS: Brain: Negative for acute infarct. Small chronic infarct left cerebellum. Tiny white matter hyperintensities. Mild atrophy.  Negative for hydrocephalus Small focus of superficial subarachnoid hemorrhage over the right convexity with hemosiderin staining. No other areas of superficial siderosis. Negative for mass or edema. Vascular: Normal arterial flow voids. Skull and upper cervical spine: Negative Sinuses/Orbits: Mild mucosal edema paranasal sinuses. Right lens replacement. Other: None IMPRESSION: Negative for acute infarct. Atrophy and mild chronic ischemic change. Small chronic infarct left cerebellum Small focus of chronic subarachnoid hemorrhage over the right convexity. This could be due to prior trauma or stroke. Small vascular malformation cannot be excluded as a cause for prior hemorrhage. Electronically Signed   By: Franchot Gallo M.D.   On: 12/02/2016 10:46   US Carotid Bilateral  Result Date: 12/02/2016 CLINICAL DATA:  82 year old male with symptoms of cerebrovascular accident EXAM: BILATERAL CAROTID DUPLEX ULTRASOUND TECHNIQUE: Pearline Cables scale imaging, color Doppler and duplex ultrasound were performed of bilateral carotid  and vertebral arteries in the neck. COMPARISON:  Brain MRI 12/02/2016 ; prior MRA neck 04/26/2012 FINDINGS: Criteria: Quantification of carotid stenosis is based on velocity parameters that correlate the residual internal carotid diameter with NASCET-based stenosis levels, using the diameter of the distal internal carotid lumen as the denominator for stenosis measurement. The following velocity measurements were obtained: RIGHT ICA:  101/33 cm/sec CCA:  38/93 cm/sec SYSTOLIC ICA/CCA RATIO:  1.0 DIASTOLIC ICA/CCA RATIO:  2.0 ECA:  366 cm/sec LEFT ICA:  97/23 cm/sec CCA:  734/28 cm/sec SYSTOLIC ICA/CCA RATIO:  0.9 DIASTOLIC ICA/CCA RATIO:  1.1 ECA:  109 cm/sec RIGHT CAROTID ARTERY: Heterogeneous atherosclerotic plaque in the carotid bifurcation. By peak systolic velocity criteria, the estimated stenosis remains less than 50%. RIGHT VERTEBRAL ARTERY:  Patent with antegrade flow. LEFT CAROTID ARTERY: Heterogeneous atherosclerotic plaque in the carotid bifurcation. By peak systolic velocity criteria the estimated stenosis remains less than 50%. LEFT VERTEBRAL ARTERY:  Patent with antegrade flow. IMPRESSION: 1. Mild (1-49%) stenosis proximal right internal carotid artery secondary to heterogenous atherosclerotic plaque. 2. Mild (1-49%) stenosis proximal left internal carotid artery secondary to heterogenous atherosclerotic plaque. 3. Vertebral arteries are patent with normal antegrade flow. Signed, Criselda Peaches, MD [facility] Electronically Signed   By: Jacqulynn Cadet M.D.   On: 12/02/2016 11:20     Assessment/Plan: 82 year old male presenting after multiple falls.  MRI of the brain reviewed and shows no acute changes.  No evidence of acute infarct.  TIA remains on the differential since patient noted to have some focal weakness but since with chronic infarcts can not rule out unmasking of neurologic abnormalities due to hypoperfusion.  Carotid  dopplers unremarkable.  LDL 55.  No evidence of PD on neurological examination.    Recommendations: 1.  PT to evaluate patient gait 2.  Orthostatic vitals 3.  Continue ASA    Alexis Goodell, MD Neurology [phone] 12/02/2016, 1:14 PM

## 2016-12-02 NOTE — H&P (Signed)
[facility] NAME: Brett [name]    MR#:  [id number]  DATE OF BIRTH:  05/22/1935  DATE OF ADMISSION:  12/01/2016  PRIMARY CARE PHYSICIAN: Perrin Maltese, MD   REQUESTING/REFERRING PHYSICIAN: Dr.Brown  CHIEF COMPLAINT:   Chief Complaint  Patient presents with  . Fall    HISTORY OF PRESENT ILLNESS: Brett [name]  is a 82 y.o. male with a known history of Anemia, aortic regurgitation, B-cell lymphoma, bronchitis, coronary artery disease, carotid stenosis, diabetes, emphysema, hyperlipidemia, iron deficiency anemia, peptic ulcer disease, peripheral vascular disease- for last few weeks he started having imbalance and had multiple falls so finally sent to emergency room. In ER on CT scan he was noted to have some stroke on his CT scan and he also noted to have some weakness in his right side compared to left. His hemoglobin is lower than his baseline but his stool guaiac was negative in ER. Her physician ordered 2 units of blood transfusion for him and gave his admission for further stroke workup. Patient denies any headache, visual symptoms, speech problem.  PAST MEDICAL HISTORY:   Past Medical History:  Diagnosis Date  . Anemia   . Aortic regurgitation   . B-cell lymphoma ([place]) 2009   DX AT [facility]  . Bronchitis   . CAD (coronary artery disease)   . Carotid stenosis   . Diabetes mellitus, type 2 ([place])   . Emphysema of lung ([place])   . Essential hypertension   . History of chemotherapy   . Hyperlipidemia   . IDA (iron deficiency anemia)   . Leg edema   . Meralgia paraesthetica   . Mitral regurgitation   . PUD (peptic ulcer disease)   . PVD (peripheral vascular disease) ([place])   . Tobacco abuse     PAST SURGICAL HISTORY: Past Surgical History:  Procedure Laterality Date  . CARDIAC CATHETERIZATION  08/15/2007  . CARDIAC CATHETERIZATION Right 07/13/2016   Procedure: Right/Left Heart Cath and Coronary Angiography;  Surgeon: Dionisio David, MD;  Location: [facility];  Service: Cardiovascular;  Laterality: Right;  . COLONOSCOPY  03/2013  . ESOPHAGOGASTRODUODENOSCOPY  03/2013    SOCIAL HISTORY:  Social History  Substance Use Topics  . Smoking status: Former Smoker    Types: Cigarettes    Quit date: 08/30/2015  . Smokeless tobacco: Never Used  . Alcohol use No    FAMILY HISTORY:  Family History  Problem Relation Age of Onset  . Rheum arthritis Neg Hx   . Osteoarthritis Neg Hx   . Asthma Neg Hx   . Diabetes Neg Hx   . Cancer Neg Hx     DRUG ALLERGIES: No Known Allergies  REVIEW OF SYSTEMS:   CONSTITUTIONAL: No fever, fatigue or weakness.  EYES: No blurred or double vision.  EARS, NOSE, AND THROAT: No tinnitus or ear pain.  RESPIRATORY: No cough, shortness of breath, wheezing or hemoptysis.  CARDIOVASCULAR: No chest pain, orthopnea, edema.  GASTROINTESTINAL: No nausea, vomiting, diarrhea or abdominal pain.  GENITOURINARY: No dysuria, hematuria.  ENDOCRINE: No polyuria, nocturia,  HEMATOLOGY: No anemia, easy bruising or bleeding SKIN: No rash or lesion. MUSCULOSKELETAL: No joint pain or arthritis.   NEUROLOGIC: No tingling, numbness, weakness. Has multiple falls in last few weeks. PSYCHIATRY: No anxiety or depression.   MEDICATIONS AT HOME:  Prior to Admission medications   Medication Sig Start Date End Date Taking? Authorizing Provider  amLODipine (NORVASC) 5 MG tablet Take 5 mg  by mouth daily.  08/27/15  Yes Historical Provider, MD  aspirin (ADULT ASPIRIN EC LOW STRENGTH) 81 MG EC tablet Take 1 tablet by mouth daily. 09/19/15  Yes Historical Provider, MD  atorvastatin (LIPITOR) 20 MG tablet Take 20 mg by mouth daily.   Yes Historical Provider, MD  clopidogrel (PLAVIX) 75 MG tablet Take 75 mg by mouth daily.  08/27/15  Yes Historical Provider, MD  Fluticasone-Salmeterol (ADVAIR DISKUS) 250-50 MCG/DOSE AEPB Inhale 1 puff into the lungs 2 times daily at 12 noon and 4 pm. 06/21/16  Yes [name], MD   furosemide (LASIX) 40 MG tablet Take 1 tablet (40 mg total) by mouth 2 (two) times daily. 09/03/16  Yes Alisa Graff, FNP  gabapentin (NEURONTIN) 100 MG capsule Take 1 capsule by mouth at bedtime.  09/22/15  Yes Historical Provider, MD  glimepiride (AMARYL) 1 MG tablet Take 1 mg by mouth daily with breakfast.   Yes Historical Provider, MD  hydrALAZINE (APRESOLINE) 25 MG tablet Take 2 tablets (50 mg total) by mouth 3 (three) times daily. 06/21/16  Yes [name], MD  hydrochlorothiazide (HYDRODIURIL) 25 MG tablet Take 25 mg by mouth daily. 09/16/15  Yes Historical Provider, MD  metoprolol (TOPROL-XL) 200 MG 24 hr tablet Take 1 tablet by mouth daily. 09/22/15  Yes Historical Provider, MD  senna-docusate (SENOKOT-S) 8.6-50 MG tablet Take 1 tablet by mouth 2 (two) times daily. Patient taking differently: Take 1 tablet by mouth 2 (two) times daily as needed.  06/21/16  Yes [name], MD  tiotropium (SPIRIVA HANDIHALER) 18 MCG inhalation capsule Place 1 capsule (18 mcg total) into inhaler and inhale daily. 07/05/16  Yes Laverle Hobby, MD      PHYSICAL EXAMINATION:   VITAL SIGNS: Blood pressure (!) 145/71, pulse (!) 109, temperature 98.2 F (36.8 C), temperature source Oral, resp. rate 20, height 5\' 10"  (1.778 m), weight 79.8 kg (176 lb), SpO2 94 %.  GENERAL:  82 y.o.-year-old patient lying in the bed with no acute distress.  EYES: Pupils equal, round, reactive to light and accommodation. No scleral icterus. Extraocular muscles intact.  HEENT: Head atraumatic, normocephalic. Oropharynx and nasopharynx clear.  NECK:  Supple, no jugular venous distention. No thyroid enlargement, no tenderness.  LUNGS: Normal breath sounds bilaterally, no wheezing, rales,rhonchi or crepitation. No use of accessory muscles of respiration.  CARDIOVASCULAR: S1, S2 normal. No murmurs, rubs, or gallops.  ABDOMEN: Soft, nontender, nondistended. Bowel sounds present. No organomegaly or mass.  EXTREMITIES: No pedal  edema, cyanosis, or clubbing.  NEUROLOGIC: Cranial nerves II through XII are intact. Muscle strength 4/5 in Upper and lower extremities. Sensation intact. Gait not checked.  Coordination is not checked. PSYCHIATRIC: The patient is alert and oriented x 3.  SKIN: No obvious rash, lesion, or ulcer.   LABORATORY PANEL:   CBC  Recent Labs Lab 12/01/16 1749  WBC 5.6  HGB 7.7*  HCT 25.3*  PLT 375  MCV 68.1*  MCH 20.7*  MCHC 30.5*  RDW 21.0*   ------------------------------------------------------------------------------------------------------------------  Chemistries   Recent Labs Lab 12/01/16 1749  NA 141  K 3.9  CL 101  CO2 31  GLUCOSE 166*  BUN 19  CREATININE 1.64*  CALCIUM 8.0*   ------------------------------------------------------------------------------------------------------------------ estimated creatinine clearance is 37.7 mL/min (by C-G formula based on SCr of 1.64 mg/dL (H)). ------------------------------------------------------------------------------------------------------------------ No results for input(s): TSH, T4TOTAL, T3FREE, THYROIDAB in the last 72 hours.  Invalid input(s): FREET3   Coagulation profile No results for input(s): INR, PROTIME in the last 168 hours. -------------------------------------------------------------------------------------------------------------------  No results for input(s): DDIMER in the last 72 hours. -------------------------------------------------------------------------------------------------------------------  Cardiac Enzymes  Recent Labs Lab 12/01/16 1749  TROPONINI 0.03*   ------------------------------------------------------------------------------------------------------------------ Invalid input(s): POCBNP  ---------------------------------------------------------------------------------------------------------------  Urinalysis    Component Value Date/Time   COLORURINE YELLOW (A) 12/01/2016  2132   APPEARANCEUR CLEAR (A) 12/01/2016 2132   LABSPEC 1.015 12/01/2016 2132   PHURINE 6.0 12/01/2016 2132   GLUCOSEU NEGATIVE 12/01/2016 2132   HGBUR NEGATIVE 12/01/2016 2132   BILIRUBINUR NEGATIVE 12/01/2016 2132   [place] NEGATIVE 12/01/2016 2132   PROTEINUR 100 (A) 12/01/2016 2132   NITRITE NEGATIVE 12/01/2016 2132   LEUKOCYTESUR NEGATIVE 12/01/2016 2132     RADIOLOGY: Dg Chest 2 View  Result Date: 12/01/2016 CLINICAL DATA:  Repeated falls over the past 5 days.  Weakness. EXAM: CHEST  2 VIEW COMPARISON:  08/12/2016 CXR FINDINGS: Stable cardiomegaly with aortic atherosclerosis. Mild faint airspace opacities bilaterally which may reflect sequela of CHF. Trace left effusion with left basilar atelectasis. Calcific bursitis versus rotator cuff tendinopathy on the left. Mild AC joint osteoarthritis. IMPRESSION: Stable cardiomegaly with aortic atherosclerosis. Faint airspace opacities bilaterally with small left effusion may reflect sequela of CHF. Electronically Signed   By: Ashley Royalty M.D.   On: 12/01/2016 20:33   Ct Head Wo Contrast  Result Date: 12/01/2016 CLINICAL DATA:  Frequent falls over the past 5 days. Denies hitting head or loss consciousness. : EXAM: CT HEAD WITHOUT CONTRAST TECHNIQUE: Contiguous axial images were obtained from the base of the skull through the vertex without intravenous contrast. COMPARISON:  None. FINDINGS: Brain: There is mild superficial and central atrophy. Chronic appearing small vessel ischemic changes periventricular and subcortical white matter. Chronic small left cerebellar infarct. No intra-axial mass nor extra-axial fluid collections. Vascular: Moderate carotid siphon calcifications bilaterally. Skull: No acute osseous abnormality. Sinuses/Orbits: Symmetric appearing orbits and globes. Right lens replacement surgery. The visualized paranasal sinuses and mastoids are clear and without acute appearing abnormality. Other: None IMPRESSION: Mild superficial and  central atrophy with chronic appearing small vessel ischemic disease of periventricular and subcortical white matter. Chronic appearing left tiny cerebellar infarct. No acute intracranial abnormality. Electronically Signed   By: Ashley Royalty M.D.   On: 12/01/2016 18:38    EKG: Orders placed or performed during the hospital encounter of 12/01/16  . EKG 12-Lead  . EKG 12-Lead  . ED EKG  . ED EKG    IMPRESSION AND PLAN:  * Stroke   Patient has symptoms for last few weeks and CT scan shows possibly chronic versus subacute stroke.   We'll do MRI on the brain, carotid Doppler. Echocardiogram was done 4 months ago so no need to repeat.    Physical therapy evaluation, frequent neuro checks, orthostatic vital checks.   Check lipid panel and hemoglobin A1c.   Patient is already taking aspirin and Plavix, continue that, increase statin dose.   Neurology consult.  * Iron deficiency anemia   The patient's hemoglobin is less than baseline but no guaiac positive stool.   ER physician with transfusing PRBCs.   Check iron studies and he may need to be started on oral iron supplements.  * Hypertension   Continue home medications.  * History of coronary artery disease   Continue home medication.  * Diabetes   Continue oral agents, keep on sliding scale coverage.  All the records are reviewed and case discussed with ED provider. Management plans discussed with the patient, family and they are in agreement.  CODE STATUS: full Code Status History  Date Active Date Inactive Code Status Order ID Comments User Context   07/11/2016 12:18 AM 07/11/2016  5:07 PM Full Code [id number]  Lance Coon, MD Inpatient   06/18/2016  3:40 AM 06/21/2016  6:44 PM Full Code [id number]  Saundra Shelling, MD ED       TOTAL TIME TAKING CARE OF THIS PATIENT: 50 minutes.    Vaughan Basta M.D on 12/02/2016   Between 7am to 6pm - Pager - [phone]  After 6pm go to [email or web address] - password [email or web address] [facility]  Office  [phone]  CC: Primary care physician; Perrin Maltese, MD   Note: This dictation was prepared with Dragon dictation along with smaller phrase technology. Any transcriptional errors that result from this process are unintentional.

## 2016-12-02 NOTE — Progress Notes (Signed)
[facility]                                                                                                                                                                                  Patient Demographics   Brett [name], is a 82 y.o. male, DOB - [birth year], DDU:[id number]  Admit date - 12/01/2016   Admitting Physician Dustin Flock, MD  Outpatient Primary MD for the patient is Perrin Maltese, MD   LOS - 0  Subjective: Patient admited with falls and stroke like symptoms, Continuing to complain of feeling weak.    Review of Systems:   CONSTITUTIONAL: No documented fever. Positive fatigue, positive weakness. No weight gain, no weight loss.  EYES: No blurry or double vision.  ENT: No tinnitus. No postnasal drip. No redness of the oropharynx.  RESPIRATORY: No cough, no wheeze, no hemoptysis. No dyspnea.  CARDIOVASCULAR: No chest pain. No orthopnea. No palpitations. No syncope.  GASTROINTESTINAL: No nausea, no vomiting or diarrhea. No abdominal pain. No melena or hematochezia.  GENITOURINARY: No dysuria or hematuria.  ENDOCRINE: No polyuria or nocturia. No heat or cold intolerance.  HEMATOLOGY: No anemia. No bruising. No bleeding.  INTEGUMENTARY: No rashes. No lesions.  MUSCULOSKELETAL: No arthritis. No swelling. No gout.  NEUROLOGIC: No numbness, tingling, or ataxia. No seizure-type activity.  PSYCHIATRIC: No anxiety. No insomnia. No ADD.    Vitals:   Vitals:   12/02/16 1305 12/02/16 1307 12/02/16 1310 12/02/16 1313  BP: 136/77 (!) 143/80 (!) 156/89 (!) 149/82  Pulse: (!) 106 (!) 111 (!) 105 99  Resp: 18 18 18 18   Temp: 98.4 F (36.9 C) 98.4 F (36.9 C) 98.5 F (36.9 C) 98.5 F (36.9 C)  TempSrc: Oral Oral Oral Oral  SpO2: 94% 94% 96% 96%  Weight:      Height:        Wt Readings from Last 3 Encounters:  12/02/16 183 lb 4.8 oz (83.1 kg)  10/07/16 175 lb (79.4 kg)  09/03/16 183 lb (83 kg)     Intake/Output Summary (Last  24 hours) at 12/02/16 1534 Last data filed at 12/02/16 1527  Gross per 24 hour  Intake             1380 ml  Output              400 ml  Net              980 ml    Physical Exam:   GENERAL: Pleasant-appearing in no apparent distress.  HEAD, EYES, EARS, NOSE AND THROAT: Atraumatic, normocephalic. Extraocular muscles are intact. Pupils equal and reactive to light. Sclerae anicteric. No conjunctival  injection. No oro-pharyngeal erythema.  NECK: Supple. There is no jugular venous distention. No bruits, no lymphadenopathy, no thyromegaly.  HEART: Regular rate and rhythm,. No murmurs, no rubs, no clicks.  LUNGS: Clear to auscultation bilaterally. No rales or rhonchi. No wheezes.  ABDOMEN: Soft, flat, nontender, nondistended. Has good bowel sounds. No hepatosplenomegaly appreciated.  EXTREMITIES: No evidence of any cyanosis, clubbing, or peripheral edema.  +2 pedal and radial pulses bilaterally.  NEUROLOGIC: The patient is alert, awake, and oriented x3 with diffuse motor weakness no sensory deficits appreciated bilaterally.  SKIN: Moist and warm with no rashes appreciated.  Psych: Not anxious, depressed LN: No inguinal LN enlargement    Antibiotics   Anti-infectives    None      Medications   Scheduled Meds: . amLODipine  5 mg Oral Daily  . aspirin EC  81 mg Oral Daily  . atorvastatin  40 mg Oral Daily  . clopidogrel  75 mg Oral Daily  . furosemide  40 mg Oral BID  . gabapentin  100 mg Oral QHS  . glimepiride  1 mg Oral Q breakfast  . heparin  5,000 Units Subcutaneous Q8H  . hydrALAZINE  50 mg Oral TID  . insulin aspart  0-9 Units Subcutaneous TID WC  . metoprolol  200 mg Oral Daily  . mometasone-formoterol  2 puff Inhalation BID  . sodium chloride flush  3 mL Intravenous Q12H  . tiotropium  18 mcg Inhalation Daily   Continuous Infusions: PRN Meds:.senna-docusate   Data Review:   Micro Results No results found for this or any previous visit (from the past 240  hour(s)).  Radiology Reports Dg Chest 2 View  Result Date: 12/01/2016 CLINICAL DATA:  Repeated falls over the past 5 days.  Weakness. EXAM: CHEST  2 VIEW COMPARISON:  08/12/2016 CXR FINDINGS: Stable cardiomegaly with aortic atherosclerosis. Mild faint airspace opacities bilaterally which may reflect sequela of CHF. Trace left effusion with left basilar atelectasis. Calcific bursitis versus rotator cuff tendinopathy on the left. Mild AC joint osteoarthritis. IMPRESSION: Stable cardiomegaly with aortic atherosclerosis. Faint airspace opacities bilaterally with small left effusion may reflect sequela of CHF. Electronically Signed   By: Ashley Royalty M.D.   On: 12/01/2016 20:33   Ct Head Wo Contrast  Result Date: 12/01/2016 CLINICAL DATA:  Frequent falls over the past 5 days. Denies hitting head or loss consciousness. : EXAM: CT HEAD WITHOUT CONTRAST TECHNIQUE: Contiguous axial images were obtained from the base of the skull through the vertex without intravenous contrast. COMPARISON:  None. FINDINGS: Brain: There is mild superficial and central atrophy. Chronic appearing small vessel ischemic changes periventricular and subcortical white matter. Chronic small left cerebellar infarct. No intra-axial mass nor extra-axial fluid collections. Vascular: Moderate carotid siphon calcifications bilaterally. Skull: No acute osseous abnormality. Sinuses/Orbits: Symmetric appearing orbits and globes. Right lens replacement surgery. The visualized paranasal sinuses and mastoids are clear and without acute appearing abnormality. Other: None IMPRESSION: Mild superficial and central atrophy with chronic appearing small vessel ischemic disease of periventricular and subcortical white matter. Chronic appearing left tiny cerebellar infarct. No acute intracranial abnormality. Electronically Signed   By: Ashley Royalty M.D.   On: 12/01/2016 18:38   Mr Brain Wo Contrast  Result Date: 12/02/2016 CLINICAL DATA:  Unstable balance.  Multiple falls. History of diabetes hyperlipidemia vascular disease. EXAM: MRI HEAD WITHOUT CONTRAST TECHNIQUE: Multiplanar, multiecho pulse sequences of the brain and surrounding structures were obtained without intravenous contrast. COMPARISON:  CT head 12/01/2016 FINDINGS: Brain: Negative for acute  infarct. Small chronic infarct left cerebellum. Tiny white matter hyperintensities. Mild atrophy.  Negative for hydrocephalus Small focus of superficial subarachnoid hemorrhage over the right convexity with hemosiderin staining. No other areas of superficial siderosis. Negative for mass or edema. Vascular: Normal arterial flow voids. Skull and upper cervical spine: Negative Sinuses/Orbits: Mild mucosal edema paranasal sinuses. Right lens replacement. Other: None IMPRESSION: Negative for acute infarct. Atrophy and mild chronic ischemic change. Small chronic infarct left cerebellum Small focus of chronic subarachnoid hemorrhage over the right convexity. This could be due to prior trauma or stroke. Small vascular malformation cannot be excluded as a cause for prior hemorrhage. Electronically Signed   By: Franchot Gallo M.D.   On: 12/02/2016 10:46   US Carotid Bilateral  Result Date: 12/02/2016 CLINICAL DATA:  82 year old male with symptoms of cerebrovascular accident EXAM: BILATERAL CAROTID DUPLEX ULTRASOUND TECHNIQUE: Pearline Cables scale imaging, color Doppler and duplex ultrasound were performed of bilateral carotid and vertebral arteries in the neck. COMPARISON:  Brain MRI 12/02/2016 ; prior MRA neck 04/26/2012 FINDINGS: Criteria: Quantification of carotid stenosis is based on velocity parameters that correlate the residual internal carotid diameter with NASCET-based stenosis levels, using the diameter of the distal internal carotid lumen as the denominator for stenosis measurement. The following velocity measurements were obtained: RIGHT ICA:  101/33 cm/sec CCA:  10/93 cm/sec SYSTOLIC ICA/CCA RATIO:  1.0 DIASTOLIC ICA/CCA  RATIO:  2.0 ECA:  366 cm/sec LEFT ICA:  97/23 cm/sec CCA:  235/57 cm/sec SYSTOLIC ICA/CCA RATIO:  0.9 DIASTOLIC ICA/CCA RATIO:  1.1 ECA:  109 cm/sec RIGHT CAROTID ARTERY: Heterogeneous atherosclerotic plaque in the carotid bifurcation. By peak systolic velocity criteria, the estimated stenosis remains less than 50%. RIGHT VERTEBRAL ARTERY:  Patent with antegrade flow. LEFT CAROTID ARTERY: Heterogeneous atherosclerotic plaque in the carotid bifurcation. By peak systolic velocity criteria the estimated stenosis remains less than 50%. LEFT VERTEBRAL ARTERY:  Patent with antegrade flow. IMPRESSION: 1. Mild (1-49%) stenosis proximal right internal carotid artery secondary to heterogenous atherosclerotic plaque. 2. Mild (1-49%) stenosis proximal left internal carotid artery secondary to heterogenous atherosclerotic plaque. 3. Vertebral arteries are patent with normal antegrade flow. Signed, Criselda Peaches, MD [facility] Electronically Signed   By: Jacqulynn Cadet M.D.   On: 12/02/2016 11:20     CBC  Recent Labs Lab 12/01/16 1749 12/02/16 1445  WBC 5.6 5.8  HGB 7.7* 8.1*  HCT 25.3* 26.0*  PLT 375 354  MCV 68.1* 68.8*  MCH 20.7* 21.5*  MCHC 30.5* 31.2*  RDW 21.0* 21.0*  LYMPHSABS  --  0.6*  MONOABS  --  0.4  EOSABS  --  0.8*  BASOSABS  --  0.0    Chemistries   Recent Labs Lab 12/01/16 1749 12/02/16 0310  NA 141 140  K 3.9 3.6  CL 101 103  CO2 31 32  GLUCOSE 166* 123*  BUN 19 17  CREATININE 1.64* 1.54*  CALCIUM 8.0* 7.6*   ------------------------------------------------------------------------------------------------------------------ estimated creatinine clearance is 40.2 mL/min (by C-G formula based on SCr of 1.54 mg/dL (H)). ------------------------------------------------------------------------------------------------------------------ No results for input(s): HGBA1C in the last 72  hours. ------------------------------------------------------------------------------------------------------------------  Recent Labs  12/02/16 0310  CHOL 114  HDL 22*  LDLCALC 55  TRIG 187*  CHOLHDL 5.2   ------------------------------------------------------------------------------------------------------------------ No results for input(s): TSH, T4TOTAL, T3FREE, THYROIDAB in the last 72 hours.  Invalid input(s): FREET3 ------------------------------------------------------------------------------------------------------------------  Recent Labs  12/02/16 0310  TIBC 245*  IRON 21*    Coagulation profile No results for input(s):  INR, PROTIME in the last 168 hours.  No results for input(s): DDIMER in the last 72 hours.  Cardiac Enzymes  Recent Labs Lab 12/01/16 1749  TROPONINI 0.03*   ------------------------------------------------------------------------------------------------------------------ Invalid input(s): POCBNP    Assessment & Plan  Patient is 53 admitted with fall noted to have weakness  *Generalized weakness    MRI suggest old strokes and chronic hemorrhage. Seen by neuro   Will need physical therapy evaluation   *Iron deficiency anemia   Status post transfusion I will give him dose of iron.  * Hypertension   Continue home medications. Blood pressure stable  * History of coronary artery disease   Continue Plavix aspirin and metoprolol  * Diabetes   Continue oral agents, keep on sliding scale coverage.  All the records are reviewed and case discussed with ED provider. Management plans discussed with the patient, family and they are in agreement.  CODE STATUS: full          Code Status History    Date Active Date Inactive Code Status Order ID Comments User Context   07/11/2016 12:18 AM 07/11/2016  5:07 PM Full Code [id number]  Lance Coon, MD Inpatient   06/18/2016  3:40 AM 06/21/2016  6:44 PM Full Code [id number]  Saundra Shelling,  MD ED       TOTAL TIME TAKING CARE OF THIS PATIENT: 29minminutes.    Vaughan Basta M.D on 12/02/2016       Code Status Orders        Start     Ordered   12/02/16 0813  Full code  Continuous     12/02/16 0812    Code Status History    Date Active Date Inactive Code Status Order ID Comments User Context   07/11/2016 12:18 AM 07/11/2016  5:07 PM Full Code [id number]  Lance Coon, MD Inpatient   06/18/2016  3:40 AM 06/21/2016  6:44 PM Full Code [id number]  Saundra Shelling, MD ED           Consults  neurology   DVT Prophylaxis  Lovenox   Lab Results  Component Value Date   PLT 354 12/02/2016     Time Spent in minutes   54min  Greater than 50% of time spent in care coordination and counseling patient regarding the condition and plan of care.   Dustin Flock M.D on 12/02/2016 at 3:34 PM  Between 7am to 6pm - Pager - [phone]  After 6pm go to [email or web address] - password [email or web address] [facility]   Office  [phone]

## 2016-12-02 NOTE — Evaluation (Signed)
Physical Therapy Evaluation Patient Details Name: Brett [name] MRN: [id number] DOB: 1935/04/25 Today's Date: 12/02/2016   History of Present Illness  Pt is a 82 year old male presenting after multiple falls.  MRI of the brain reviewed and shows no acute changes.  No evidence of acute infarct.  TIA remains on the differential since patient noted to have some focal weakness but since with chronic infarcts can not rule out unmasking of neurologic abnormalities due to hypoperfusion.  Carotid dopplers unremarkable.  LDL 55.  No evidence of PD on neurological examination.      Clinical Impression  Pt presents with deficits in gait, balance, and activity tolerance.  Pt showed good functional strength in BUEs and BLEs with strength grossly equal L and R.  Light touch and proprioception grossly intact to BLEs and no deficits R VS L noted in coordination with finger to nose test or RAMs of UEs.  Pt ind with bed mobility tasks and SBA with transfers without LOB.  Pt able to amb 100' with RW and SBA with slow/moderate cadence and no LOB but was limited by fatigue.  Orthostatic BPs taken (mmHg) with supine: 116/51, sitting: 125/59, and standing: 123/61 all without symptoms.  Baseline HR in the low 90s with HR 110 bpm and SpO2 88% after amb with SpO2 returning to baseline of 91-92% quickly upon sitting with cues for breathing technique.  Pt will benefit from PT services to address above deficits for decreased caregiver assistance upon discharge.      Follow Up Recommendations Home health PT    Equipment Recommendations  Rolling walker with 5" wheels    Recommendations for Other Services       Precautions / Restrictions Precautions Precautions: Fall Restrictions Weight Bearing Restrictions: No      Mobility  Bed Mobility Overal bed mobility: Independent                Transfers Overall transfer level: Needs assistance Equipment used: Rolling walker (2 wheeled) Transfers: Sit to/from  Stand Sit to Stand: Supervision         General transfer comment: Pt confident and steady with standing without LOB  Ambulation/Gait Ambulation/Gait assistance: Supervision Ambulation Distance (Feet): 100 Feet Assistive device: Rolling walker (2 wheeled) Gait Pattern/deviations: Step-through pattern   Gait velocity interpretation: Below normal speed for age/gender General Gait Details: Slow to moderate cadence wtih gait without LOB with distance limited by fatigue and minimal SOB; SpO2 88% on 2LO2/min and HR 110 bpm after amb.  SpO2 returned to 91% after sitting < 30 sec with cues for breathing technique.  Stairs            Wheelchair Mobility    Modified Rankin (Stroke Patients Only)       Balance Overall balance assessment: Needs assistance Sitting-balance support: No upper extremity supported Sitting balance-Leahy Scale: Normal     Standing balance support: Bilateral upper extremity supported Standing balance-Leahy Scale: Good                               Pertinent Vitals/Pain Pain Assessment: No/denies pain    Home Living Family/patient expects to be discharged to:: Private residence Living Arrangements: Alone Available Help at Discharge: Friend(s);Available PRN/intermittently Type of Home: House Home Access: Stairs to enter Entrance Stairs-Rails: Left Entrance Stairs-Number of Steps: 3 Home Layout: One level Home Equipment: Cane - quad      Prior Function Level of Independence: Independent with  assistive device(s)         Comments: Pt reports Ind Amb limited community distances with QC, no fall history beyond recent bouts of falling, Ind with ADLs     Hand Dominance   Dominant Hand: Right    Extremity/Trunk Assessment   Upper Extremity Assessment Upper Extremity Assessment: Overall WFL for tasks assessed    Lower Extremity Assessment Lower Extremity Assessment: Overall WFL for tasks assessed       Communication    Communication: HOH  Cognition Arousal/Alertness: Awake/alert Behavior During Therapy: WFL for tasks assessed/performed Overall Cognitive Status: Within Functional Limits for tasks assessed                      General Comments      Exercises Total Joint Exercises Ankle Circles/Pumps: Strengthening;Both;10 reps Quad Sets: AROM;Both;10 reps Gluteal Sets: AROM;Both;10 reps Towel Squeeze: AROM;Both;10 reps Straight Leg Raises: AROM;Both;5 reps Long Arc Quad: AROM;Both;10 reps Knee Flexion: AROM;Both;10 reps Marching in Standing: AROM;10 reps   Assessment/Plan    PT Assessment Patient needs continued PT services  PT Problem List Decreased activity tolerance;Decreased balance          PT Treatment Interventions Gait training;Stair training;Therapeutic exercise;Therapeutic activities;Balance training;Neuromuscular re-education;Patient/family education    PT Goals (Current goals can be found in the Care Plan section)  Acute Rehab PT Goals Patient Stated Goal: I want to walk like I used to be able to PT Goal Formulation: With patient Time For Goal Achievement: 12/15/16 Potential to Achieve Goals: Good    Frequency Min 2X/week   Barriers to discharge        Co-evaluation               End of Session Equipment Utilized During Treatment: Gait belt;Oxygen Activity Tolerance: Patient limited by fatigue Patient left: in bed;with bed alarm set;with call bell/phone within reach           Time: 7116-5790 PT Time Calculation (min) (ACUTE ONLY): 36 min   Charges:   PT Evaluation $PT Eval Low Complexity: 1 Procedure PT Treatments $Therapeutic Exercise: 8-22 mins   PT G Codes:        DRoyetta Asal PT, DPT 12/02/16, 4:44 PM

## 2016-12-03 DIAGNOSIS — Z23 Encounter for immunization: Secondary | ICD-10-CM | POA: Diagnosis not present

## 2016-12-03 DIAGNOSIS — I639 Cerebral infarction, unspecified: Secondary | ICD-10-CM | POA: Diagnosis not present

## 2016-12-03 DIAGNOSIS — D509 Iron deficiency anemia, unspecified: Secondary | ICD-10-CM | POA: Diagnosis not present

## 2016-12-03 LAB — TYPE AND SCREEN
ABO/RH(D): B POS
Antibody Screen: NEGATIVE
UNIT DIVISION: 0
UNIT DIVISION: 0

## 2016-12-03 LAB — CBC
HEMATOCRIT: 28.5 % — AB (ref 40.0–52.0)
Hemoglobin: 9 g/dL — ABNORMAL LOW (ref 13.0–18.0)
MCH: 21.5 pg — AB (ref 26.0–34.0)
MCHC: 31.5 g/dL — AB (ref 32.0–36.0)
MCV: 68.4 fL — ABNORMAL LOW (ref 80.0–100.0)
Platelets: 362 10*3/uL (ref 150–440)
RBC: 4.17 MIL/uL — ABNORMAL LOW (ref 4.40–5.90)
RDW: 21.1 % — ABNORMAL HIGH (ref 11.5–14.5)
WBC: 7.7 10*3/uL (ref 3.8–10.6)

## 2016-12-03 LAB — HEMOGLOBIN A1C
Hgb A1c MFr Bld: 7.3 % — ABNORMAL HIGH (ref 4.8–5.6)
Mean Plasma Glucose: 163 mg/dL

## 2016-12-03 LAB — GLUCOSE, CAPILLARY
GLUCOSE-CAPILLARY: 157 mg/dL — AB (ref 65–99)
Glucose-Capillary: 105 mg/dL — ABNORMAL HIGH (ref 65–99)

## 2016-12-03 NOTE — Discharge Instructions (Signed)
[facility] [place] [facility]:  Cardiac diet  DISCHARGE CONDITION:  Stable  ACTIVITY:  Activity as tolerated with walker  OXYGEN:  Home Oxygen: No.   Oxygen Delivery: room air  DISCHARGE LOCATION:  home    ADDITIONAL DISCHARGE INSTRUCTION:   If you experience worsening of your admission symptoms, develop shortness of breath, life threatening emergency, suicidal or homicidal thoughts you must seek medical attention immediately by calling 911 or calling your MD immediately  if symptoms less severe.  You Must read complete instructions/literature along with all the possible adverse reactions/side effects for all the Medicines you take and that have been prescribed to you. Take any new Medicines after you have completely understood and accpet all the possible adverse reactions/side effects.   Please note  You were cared for by a hospitalist during your hospital stay. If you have any questions about your discharge medications or the care you received while you were in the hospital after you are discharged, you can call the unit and asked to speak with the hospitalist on call if the hospitalist that took care of you is not available. Once you are discharged, your primary care physician will handle any further medical issues. Please note that NO REFILLS for any discharge medications will be authorized once you are discharged, as it is imperative that you return to your primary care physician (or establish a relationship with a primary care physician if you do not have one) for your aftercare needs so that they can reassess your need for medications and monitor your lab values.

## 2016-12-03 NOTE — Discharge Summary (Signed)
[place] [facility] [place], 82 y.o., DOB 11-02-1935, MRN [id number]. Admission date: 12/01/2016 Discharge Date 12/03/2016 Primary MD Perrin Maltese, MD Admitting Physician Dustin Flock, MD  Admission Diagnosis  Cerebral infarction [facility]) [I63.9] CVA (cerebral infarction) [I63.9] Right leg weakness [R29.898] Fall, initial encounter [W19.XXXA] Symptomatic anemia [D64.9]  Discharge Diagnosis   Principal Problem: Fall Symptomatic anemia with guaiac negative stools History of B cell lymphoma Coronary artery disease Diabetes type 2 COPD Essential hypertension Hyperlipidemia unspecified Valvular heart disease Peptic ulcer disease    Hospital Course Brett [name]  is a 82 y.o. male with a known history of Anemia, aortic regurgitation, B-cell lymphoma, bronchitis, coronary artery disease, carotid stenosis, diabetes, emphysema, hyperlipidemia, iron deficiency anemia, peptic ulcer disease, peripheral vascular disease- for last few weeks he started having imbalance and had multiple falls so finally sent to emergency room. Patient in the ER was noted to be anemic. Therefore he was transfused. He does have a history of iron deficiency anemia. He was guaiac negative. Patient will need outpatient GI follow-up was as well as follow-up with his primary care provider increased still having some significant anemia will need to follow-up with hematology. Patient received transfusion. He started feeling better. He also had MRI of the brain which showed old stroke as well as chronic subarachnoid hemorrhage which neurology did not feel needed any intervention. but no new strokes. He was seen by physical therapy who recommended home PT. Patient currently being arranged for that.             Consults  neurology  Significant Tests:  See full reports for all details     Dg Chest 2 View  Result Date: 12/01/2016 CLINICAL DATA:  Repeated falls over the past 5  days.  Weakness. EXAM: CHEST  2 VIEW COMPARISON:  08/12/2016 CXR FINDINGS: Stable cardiomegaly with aortic atherosclerosis. Mild faint airspace opacities bilaterally which may reflect sequela of CHF. Trace left effusion with left basilar atelectasis. Calcific bursitis versus rotator cuff tendinopathy on the left. Mild AC joint osteoarthritis. IMPRESSION: Stable cardiomegaly with aortic atherosclerosis. Faint airspace opacities bilaterally with small left effusion may reflect sequela of CHF. Electronically Signed   By: Ashley Royalty M.D.   On: 12/01/2016 20:33   Ct Head Wo Contrast  Result Date: 12/01/2016 CLINICAL DATA:  Frequent falls over the past 5 days. Denies hitting head or loss consciousness. : EXAM: CT HEAD WITHOUT CONTRAST TECHNIQUE: Contiguous axial images were obtained from the base of the skull through the vertex without intravenous contrast. COMPARISON:  None. FINDINGS: Brain: There is mild superficial and central atrophy. Chronic appearing small vessel ischemic changes periventricular and subcortical white matter. Chronic small left cerebellar infarct. No intra-axial mass nor extra-axial fluid collections. Vascular: Moderate carotid siphon calcifications bilaterally. Skull: No acute osseous abnormality. Sinuses/Orbits: Symmetric appearing orbits and globes. Right lens replacement surgery. The visualized paranasal sinuses and mastoids are clear and without acute appearing abnormality. Other: None IMPRESSION: Mild superficial and central atrophy with chronic appearing small vessel ischemic disease of periventricular and subcortical white matter. Chronic appearing left tiny cerebellar infarct. No acute intracranial abnormality. Electronically Signed   By: Ashley Royalty M.D.   On: 12/01/2016 18:38   Mr Brain Wo Contrast  Result Date: 12/02/2016 CLINICAL DATA:  Unstable balance. Multiple falls. History of diabetes hyperlipidemia vascular disease. EXAM: MRI HEAD WITHOUT CONTRAST TECHNIQUE: Multiplanar,  multiecho pulse sequences of the brain and surrounding structures were obtained without intravenous contrast. COMPARISON:  CT head  12/01/2016 FINDINGS: Brain: Negative for acute infarct. Small chronic infarct left cerebellum. Tiny white matter hyperintensities. Mild atrophy.  Negative for hydrocephalus Small focus of superficial subarachnoid hemorrhage over the right convexity with hemosiderin staining. No other areas of superficial siderosis. Negative for mass or edema. Vascular: Normal arterial flow voids. Skull and upper cervical spine: Negative Sinuses/Orbits: Mild mucosal edema paranasal sinuses. Right lens replacement. Other: None IMPRESSION: Negative for acute infarct. Atrophy and mild chronic ischemic change. Small chronic infarct left cerebellum Small focus of chronic subarachnoid hemorrhage over the right convexity. This could be due to prior trauma or stroke. Small vascular malformation cannot be excluded as a cause for prior hemorrhage. Electronically Signed   By: Franchot Gallo M.D.   On: 12/02/2016 10:46   US Carotid Bilateral  Result Date: 12/02/2016 CLINICAL DATA:  82 year old male with symptoms of cerebrovascular accident EXAM: BILATERAL CAROTID DUPLEX ULTRASOUND TECHNIQUE: Pearline Cables scale imaging, color Doppler and duplex ultrasound were performed of bilateral carotid and vertebral arteries in the neck. COMPARISON:  Brain MRI 12/02/2016 ; prior MRA neck 04/26/2012 FINDINGS: Criteria: Quantification of carotid stenosis is based on velocity parameters that correlate the residual internal carotid diameter with NASCET-based stenosis levels, using the diameter of the distal internal carotid lumen as the denominator for stenosis measurement. The following velocity measurements were obtained: RIGHT ICA:  101/33 cm/sec CCA:  05/39 cm/sec SYSTOLIC ICA/CCA RATIO:  1.0 DIASTOLIC ICA/CCA RATIO:  2.0 ECA:  366 cm/sec LEFT ICA:  97/23 cm/sec CCA:  767/34 cm/sec SYSTOLIC ICA/CCA RATIO:  0.9 DIASTOLIC ICA/CCA  RATIO:  1.1 ECA:  109 cm/sec RIGHT CAROTID ARTERY: Heterogeneous atherosclerotic plaque in the carotid bifurcation. By peak systolic velocity criteria, the estimated stenosis remains less than 50%. RIGHT VERTEBRAL ARTERY:  Patent with antegrade flow. LEFT CAROTID ARTERY: Heterogeneous atherosclerotic plaque in the carotid bifurcation. By peak systolic velocity criteria the estimated stenosis remains less than 50%. LEFT VERTEBRAL ARTERY:  Patent with antegrade flow. IMPRESSION: 1. Mild (1-49%) stenosis proximal right internal carotid artery secondary to heterogenous atherosclerotic plaque. 2. Mild (1-49%) stenosis proximal left internal carotid artery secondary to heterogenous atherosclerotic plaque. 3. Vertebral arteries are patent with normal antegrade flow. Signed, Criselda Peaches, MD [facility] Electronically Signed   By: Jacqulynn Cadet M.D.   On: 12/02/2016 11:20       Today   Subjective:   Brett [name]  Feeling a little stronger. Denies any complaints  Objective:   Blood pressure 139/81, pulse 90, temperature 98.1 F (36.7 C), temperature source Oral, resp. rate 18, height 5\' 10"  (1.778 m), weight 183 lb 4.8 oz (83.1 kg), SpO2 93 %.  .  Intake/Output Summary (Last 24 hours) at 12/03/16 1545 Last data filed at 12/03/16 1200  Gross per 24 hour  Intake              720 ml  Output              900 ml  Net             -180 ml    Exam VITAL SIGNS: Blood pressure 139/81, pulse 90, temperature 98.1 F (36.7 C), temperature source Oral, resp. rate 18, height 5\' 10"  (1.778 m), weight 183 lb 4.8 oz (83.1 kg), SpO2 93 %.  GENERAL:  82 y.o.-year-old patient lying in the bed with no acute distress.  EYES: Pupils equal, round, reactive to light and accommodation. No scleral icterus. Extraocular muscles intact.  HEENT: Head atraumatic, normocephalic. Oropharynx and  nasopharynx clear.  NECK:  Supple, no jugular venous  distention. No thyroid enlargement, no tenderness.  LUNGS: Normal breath sounds bilaterally, no wheezing, rales,rhonchi or crepitation. No use of accessory muscles of respiration.  CARDIOVASCULAR: S1, S2 normal. Positive systolic murmurs, rubs, or gallops.  ABDOMEN: Soft, nontender, nondistended. Bowel sounds present. No organomegaly or mass.  EXTREMITIES: No pedal edema, cyanosis, or clubbing.  NEUROLOGIC: Cranial nerves II through XII are intact. Muscle strength 5/5 in all extremities. Sensation intact. Gait not checked.  PSYCHIATRIC: The patient is alert and oriented x 3.  SKIN: No obvious rash, lesion, or ulcer.   Data Review     CBC w Diff: Lab Results  Component Value Date   WBC 7.7 12/03/2016   HGB 9.0 (L) 12/03/2016   HGB 12.5 (L) 05/07/2010   HCT 28.5 (L) 12/03/2016   HCT 36.5 (L) 05/07/2010   PLT 362 12/03/2016   PLT 254 05/07/2010   LYMPHOPCT 10 12/02/2016   LYMPHOPCT 15.8 05/07/2010   MONOPCT 7 12/02/2016   MONOPCT 7.7 05/07/2010   EOSPCT 15 12/02/2016   EOSPCT 4.4 05/07/2010   BASOPCT 0 12/02/2016   BASOPCT 0.5 05/07/2010   CMP: Lab Results  Component Value Date   NA 140 12/02/2016   NA 138 05/07/2010   K 3.6 12/02/2016   K 4.2 05/07/2010   CL 103 12/02/2016   CL 101 05/07/2010   CO2 32 12/02/2016   CO2 29 05/07/2010   BUN 17 12/02/2016   BUN 30 (H) 05/07/2010   CREATININE 1.54 (H) 12/02/2016   CREATININE 1.5 (H) 05/07/2010   PROT 8.0 10/07/2016   PROT 6.6 05/07/2010   ALBUMIN 3.2 (L) 10/07/2016   ALBUMIN 4.0 05/07/2010   BILITOT 0.3 10/07/2016   BILITOT 0.50 05/07/2010   ALKPHOS 76 10/07/2016   ALKPHOS 98 (H) 05/07/2010   AST 20 10/07/2016   AST 18 05/07/2010   ALT 11 (L) 10/07/2016   ALT 20 05/07/2010  .  Micro Results No results found for this or any previous visit (from the past 240 hour(s)).      Code Status Orders        Start     Ordered   12/02/16 0813  Full code  Continuous     12/02/16 0812    Code Status History    Date  Active Date Inactive Code Status Order ID Comments User Context   07/11/2016 12:18 AM 07/11/2016  5:07 PM Full Code [id number]  Lance Coon, MD Inpatient   06/18/2016  3:40 AM 06/21/2016  6:44 PM Full Code [id number]  Saundra Shelling, MD ED          Follow-up Information    Perrin Maltese, MD. Go on 12/10/2016.   Specialty:  Internal Medicine Why:  @10 :00 AM Contact information: [address] [place] [address] [phone]        Lucilla Lame, MD. Schedule an appointment as soon as possible for a visit in 1 week.   Specialty:  Gastroenterology Why:  iron def anemia Contact information: [address] [place] [address] [phone]           Discharge Medications   Allergies as of 12/03/2016   No Known Allergies     Medication List    TAKE these medications   ADULT ASPIRIN EC LOW STRENGTH 81 MG EC tablet Generic drug:  aspirin Take 1 tablet by mouth daily.   amLODipine 5 MG tablet Commonly known as:  NORVASC Take 5 mg by mouth  daily.   atorvastatin 20 MG tablet Commonly known as:  LIPITOR Take 20 mg by mouth daily.   clopidogrel 75 MG tablet Commonly known as:  PLAVIX Take 75 mg by mouth daily.   Fluticasone-Salmeterol 250-50 MCG/DOSE Aepb Commonly known as:  ADVAIR DISKUS Inhale 1 puff into the lungs 2 times daily at 12 noon and 4 pm.   furosemide 40 MG tablet Commonly known as:  LASIX Take 1 tablet (40 mg total) by mouth 2 (two) times daily.   gabapentin 100 MG capsule Commonly known as:  NEURONTIN Take 1 capsule by mouth at bedtime.   glimepiride 1 MG tablet Commonly known as:  AMARYL Take 1 mg by mouth daily with breakfast.   hydrALAZINE 25 MG tablet Commonly known as:  APRESOLINE Take 2 tablets (50 mg total) by mouth 3 (three) times daily.   hydrochlorothiazide 25 MG tablet Commonly known as:  HYDRODIURIL Take 25 mg by mouth daily.   metoprolol 200 MG 24 hr tablet Commonly known as:  TOPROL-XL Take 1 tablet by mouth  daily.   senna-docusate 8.6-50 MG tablet Commonly known as:  Senokot-S Take 1 tablet by mouth 2 (two) times daily. What changed:  when to take this  reasons to take this   tiotropium 18 MCG inhalation capsule Commonly known as:  SPIRIVA HANDIHALER Place 1 capsule (18 mcg total) into inhaler and inhale daily.            Durable Medical Equipment        Start     Ordered   12/03/16 1343  For home use only DME Walker rolling  Once    Question:  Patient needs a walker to treat with the following condition  Answer:  Altered gait   12/03/16 1343         Total Time in preparing paper work, data evaluation and todays exam - 35 minutes  Dustin Flock M.D on 12/03/2016 at 3:45 PM  [place]  [phone]

## 2016-12-03 NOTE — Clinical Social Work Note (Signed)
Clinical Social Work Assessment  Patient Details  Name: Brett [name] MRN: [id number] Date of Birth: [birth year]  Date of referral:  12/03/16               Reason for consult:  Discharge Planning                Permission sought to share information with:    Permission granted to share information::     Name::        Agency::     Relationship::     Contact Information:     Housing/Transportation Living arrangements for the past 2 months:  Single Family Home Source of Information:  Patient Patient Interpreter Needed:  None Criminal Activity/Legal Involvement Pertinent to Current Situation/Hospitalization:  No - Comment as needed Significant Relationships:  Friend Lives with:  Self Do you feel safe going back to the place where you live?  Yes Need for family participation in patient care:  Yes (Comment)  Care giving concerns:  Patient lives alone in [place] and has a supportive friend Brett [name] that lives near by and checks on him often.    Social Worker assessment / plan:  Holiday representative (CSW) received consult from RN that patient's caregiver would like to talk about placement. PT is recommending home health. CSW met with patient alone at bedside to discuss D/C plan. Patient was alert and oriented and was sitting up in the bed. CSW introduced self and explained role of CSW department. Patient reported that he lives at home and has the help the his friend Brett [name]. Patient reported that he wants to go home today. RN case manager aware of above. CSW attempted to contact patient's friend Brett [name] however he did not answer. Patient will D/C home today. Please reconsult if future social work needs arise. CSW signing off.   Employment status:  Retired Nurse, adult PT Recommendations:  Home with [place] / Referral to community resources:  Other (Comment Required) (Home Health )  Patient/Family's Response to care:  Patient prefers to go  home today.   Patient/Family's Understanding of and Emotional Response to Diagnosis, Current Treatment, and Prognosis:  Patient was very pleasant and thanked CSW for visit.   Emotional Assessment Appearance:  Appears stated age Attitude/Demeanor/Rapport:    Affect (typically observed):  Accepting, Adaptable, Pleasant Orientation:  Oriented to Self, Oriented to Place, Oriented to  Time, Oriented to Situation Alcohol / Substance use:  Not Applicable Psych involvement (Current and /or in the community):  No (Comment)  Discharge Needs  Concerns to be addressed:  Discharge Planning Concerns Readmission within the last 30 days:  No Current discharge risk:  None Barriers to Discharge:  No Barriers Identified   Saarah Dewing, Veronia Beets, LCSW 12/03/2016, 4:56 PM

## 2016-12-03 NOTE — Care Management (Signed)
Admitted to [facility] with the diagnosis of stroke. Lives alone. Friend is Claudette Head [phone]) Last seen Dr. [name] 2 weeks ago. [facility] in the past for services. No skilled facility. Home oxygen per [facility]. Prescriptions are filled per mail and [facility] [place]. No Life Alert. Takes care of all basic activities of daily living himself, doesn't drive. [name] is in the home most all the time and helps with errands. Good appetite. Cooks his own meals.  Friend will transport. Physical therapy evaluation completed. Recommends home with home health and physical therapy. [place]. Floydene Flock, [facility] representative updated. Requested Med Alert information = given Possible discharge today per Dr. Reginia Forts RN MSN CCM Care Management

## 2016-12-03 NOTE — Discharge Planning (Addendum)
Patient IV and tele removed.  Discharge papers given, explained and educated.  Patient informed of suggested FU appts and also given scripts.  Educated on safety while using walker and need to use inside and outside to avoid future falls.  RN assessment and VS revealed stability for DC to home with [facility] services. Patient SPO2 remains above 90% on RA at rest and with ambulation, therefore will not be needed oxygen at discharge.  Patient wheeled to front and friend transported home via car.

## 2016-12-08 ENCOUNTER — Encounter: Payer: Self-pay | Admitting: Family

## 2016-12-08 ENCOUNTER — Ambulatory Visit: Payer: Medicare PPO | Attending: Family | Admitting: Family

## 2016-12-08 VITALS — BP 130/59 | HR 86 | Resp 18 | Ht 70.0 in | Wt 173.0 lb

## 2016-12-08 DIAGNOSIS — I5032 Chronic diastolic (congestive) heart failure: Secondary | ICD-10-CM | POA: Insufficient documentation

## 2016-12-08 DIAGNOSIS — Z8261 Family history of arthritis: Secondary | ICD-10-CM | POA: Diagnosis not present

## 2016-12-08 DIAGNOSIS — J189 Pneumonia, unspecified organism: Secondary | ICD-10-CM | POA: Insufficient documentation

## 2016-12-08 DIAGNOSIS — Z7982 Long term (current) use of aspirin: Secondary | ICD-10-CM | POA: Insufficient documentation

## 2016-12-08 DIAGNOSIS — Z8711 Personal history of peptic ulcer disease: Secondary | ICD-10-CM | POA: Diagnosis not present

## 2016-12-08 DIAGNOSIS — Z809 Family history of malignant neoplasm, unspecified: Secondary | ICD-10-CM | POA: Diagnosis not present

## 2016-12-08 DIAGNOSIS — I110 Hypertensive heart disease with heart failure: Secondary | ICD-10-CM | POA: Insufficient documentation

## 2016-12-08 DIAGNOSIS — E1151 Type 2 diabetes mellitus with diabetic peripheral angiopathy without gangrene: Secondary | ICD-10-CM | POA: Insufficient documentation

## 2016-12-08 DIAGNOSIS — I2510 Atherosclerotic heart disease of native coronary artery without angina pectoris: Secondary | ICD-10-CM | POA: Diagnosis not present

## 2016-12-08 DIAGNOSIS — Z825 Family history of asthma and other chronic lower respiratory diseases: Secondary | ICD-10-CM | POA: Insufficient documentation

## 2016-12-08 DIAGNOSIS — R29898 Other symptoms and signs involving the musculoskeletal system: Secondary | ICD-10-CM

## 2016-12-08 DIAGNOSIS — I340 Nonrheumatic mitral (valve) insufficiency: Secondary | ICD-10-CM | POA: Insufficient documentation

## 2016-12-08 DIAGNOSIS — Z87891 Personal history of nicotine dependence: Secondary | ICD-10-CM | POA: Diagnosis not present

## 2016-12-08 DIAGNOSIS — I10 Essential (primary) hypertension: Secondary | ICD-10-CM

## 2016-12-08 DIAGNOSIS — Z833 Family history of diabetes mellitus: Secondary | ICD-10-CM | POA: Insufficient documentation

## 2016-12-08 DIAGNOSIS — Z8673 Personal history of transient ischemic attack (TIA), and cerebral infarction without residual deficits: Secondary | ICD-10-CM | POA: Insufficient documentation

## 2016-12-08 DIAGNOSIS — D509 Iron deficiency anemia, unspecified: Secondary | ICD-10-CM | POA: Diagnosis not present

## 2016-12-08 DIAGNOSIS — Z9889 Other specified postprocedural states: Secondary | ICD-10-CM | POA: Diagnosis not present

## 2016-12-08 DIAGNOSIS — E785 Hyperlipidemia, unspecified: Secondary | ICD-10-CM | POA: Diagnosis not present

## 2016-12-08 DIAGNOSIS — J432 Centrilobular emphysema: Secondary | ICD-10-CM

## 2016-12-08 DIAGNOSIS — J440 Chronic obstructive pulmonary disease with acute lower respiratory infection: Secondary | ICD-10-CM | POA: Diagnosis not present

## 2016-12-08 NOTE — Progress Notes (Signed)
Patient ID: Brett [name], male    DOB: July 01, 1935, 82 y.o.   MRN: [id number]  HPI  Brett [name] is a 82 y/o male with a history of PVD, previous tobacco use, Brett, anemia, hyperlipidemia, HTN, DM, CAD, COPD, carotid stenosis and chronic heart failure.   Last echo was done 07/11/16 which showed an EF of 55% along with mild Brett, moderate TR and PA pressure of 40 mmHg.   Had a cardiac catheterization done on 07/13/16   Mid LAD to Dist LAD lesion, 40 %stenosed.  Prox Cx to Dist Cx lesion, 30 %stenosed.  Mid RCA to Dist RCA lesion, 30 %stenosed.                    Hemodynamic findings consistent with pulmonary hypertension.  Most recently admitted on 12/01/16 due to frequent falls and anemia. Was transfused and had PT evaluation who recommended home PT. Brain MRI showed an old stroke as well as chronic subarachnoid hemorrhage which neurology felt didn't need intervention. Discharged the next day.  Was in the ED on 10/07/16 due to anemia and near syncopal episodes. Was given IV fluids due to some dehydration and was discharged from the ED. Previous admission was on 07/10/16 to [facility] with sepsis, pneumonia and exacerbation of chronic heart failure. Was treated with antibiotics along with IV diuresis. Was discharged on oxygen.   He presents today for a follow-up visit with fatigue with moderate exertion. Minimal shortness of breath with exertion. Denies any swelling in his legs. Continues to weigh and has noticed a gradual weight loss. Is supposed to be having PT come to the home soon to work on his balance, weakness and muscle strength. He says that he used to walk 2 miles daily and would like to be able to get back to walking more. Home health nurse coming today.   Past Medical History:  Diagnosis Date  . Anemia   . Aortic regurgitation   . B-cell lymphoma ([place]) 2009   DX AT [facility]  . Bronchitis   . CAD (coronary artery disease)   . Carotid stenosis   . Diabetes mellitus, type 2 ([place])   . Emphysema  of lung ([place])   . Essential hypertension   . History of chemotherapy   . Hyperlipidemia   . IDA (iron deficiency anemia)   . Leg edema   . Meralgia paraesthetica   . Mitral regurgitation   . PUD (peptic ulcer disease)   . PVD (peripheral vascular disease) ([place])   . Tobacco abuse     Past Surgical History:  Procedure Laterality Date  . CARDIAC CATHETERIZATION  08/15/2007  . CARDIAC CATHETERIZATION Right 07/13/2016   Procedure: Right/Left Heart Cath and Coronary Angiography;  Surgeon: Dionisio David, MD;  Location: [place] [facility];  Service: Cardiovascular;  Laterality: Right;  . COLONOSCOPY  03/2013  . ESOPHAGOGASTRODUODENOSCOPY  03/2013   Family History  Problem Relation Age of Onset  . Rheum arthritis Neg Hx   . Osteoarthritis Neg Hx   . Asthma Neg Hx   . Diabetes Neg Hx   . Cancer Neg Hx    Social History  Substance Use Topics  . Smoking status: Former Smoker    Types: Cigarettes    Quit date: 08/30/2015  . Smokeless tobacco: Never Used  . Alcohol use No   No Known Allergies  Prior to Admission medications   Medication Sig Start Date End Date Taking? Authorizing Provider  amLODipine (NORVASC) 5 MG tablet Take  5 mg by mouth daily.  08/27/15  Yes Historical Provider, MD  aspirin (ADULT ASPIRIN EC LOW STRENGTH) 81 MG EC tablet Take 1 tablet by mouth daily. 09/19/15  Yes Historical Provider, MD  atorvastatin (LIPITOR) 20 MG tablet Take 20 mg by mouth daily.   Yes Historical Provider, MD  clopidogrel (PLAVIX) 75 MG tablet Take 75 mg by mouth daily.  08/27/15  Yes Historical Provider, MD  Fluticasone-Salmeterol (ADVAIR DISKUS) 250-50 MCG/DOSE AEPB Inhale 1 puff into the lungs 2 times daily at 12 noon and 4 pm. 06/21/16  Yes [name], MD  furosemide (LASIX) 40 MG tablet Take 1 tablet (40 mg total) by mouth 2 (two) times daily. 09/03/16  Yes Alisa Graff, FNP  gabapentin (NEURONTIN) 100 MG capsule Take 1 capsule by mouth at bedtime.  09/22/15  Yes Historical Provider, MD   glimepiride (AMARYL) 1 MG tablet Take 1 mg by mouth daily with breakfast.   Yes Historical Provider, MD  hydrALAZINE (APRESOLINE) 25 MG tablet Take 2 tablets (50 mg total) by mouth 3 (three) times daily. 06/21/16  Yes [name], MD  hydrochlorothiazide (HYDRODIURIL) 25 MG tablet Take 25 mg by mouth daily. 09/16/15  Yes Historical Provider, MD  metoprolol (TOPROL-XL) 200 MG 24 hr tablet Take 1 tablet by mouth daily. 09/22/15  Yes Historical Provider, MD  senna-docusate (SENOKOT-S) 8.6-50 MG tablet Take 1 tablet by mouth 2 (two) times daily. Patient taking differently: Take 1 tablet by mouth 2 (two) times daily as needed.  06/21/16  Yes [name], MD  tiotropium (SPIRIVA HANDIHALER) 18 MCG inhalation capsule Place 1 capsule (18 mcg total) into inhaler and inhale daily. 07/05/16  Yes Laverle Hobby, MD      Review of Systems  Constitutional: Positive for fatigue. Negative for appetite change.  HENT: Positive for hearing loss. Negative for congestion, postnasal drip and sore throat.   Eyes: Negative.   Respiratory: Positive for shortness of breath (minimal). Negative for cough, chest tightness and wheezing.   Cardiovascular: Negative for chest pain, palpitations and leg swelling.  Gastrointestinal: Negative for abdominal distention and abdominal pain.  Endocrine: Negative.   Genitourinary: Negative.   Musculoskeletal: Negative for back pain and neck pain.  Skin: Negative.   Allergic/Immunologic: Negative.   Neurological: Negative for dizziness and light-headedness.  Hematological: Negative for adenopathy. Does not bruise/bleed easily.  Psychiatric/Behavioral: Positive for sleep disturbance (chronic difficulty with oxygen at 3L). Negative for dysphoric mood and suicidal ideas. The patient is not nervous/anxious.    Vitals:   12/08/16 0857  BP: (!) 130/59  Pulse: 86  Resp: 18  SpO2: 100%  Weight: 173 lb (78.5 kg)  Height: 5\' 10"  (1.778 m)   Wt Readings from Last 3 Encounters:   12/08/16 173 lb (78.5 kg)  12/02/16 183 lb 4.8 oz (83.1 kg)  10/07/16 175 lb (79.4 kg)   Lab Results  Component Value Date   CREATININE 1.54 (H) 12/02/2016   CREATININE 1.64 (H) 12/01/2016   CREATININE 2.06 (H) 10/07/2016    Physical Exam  Constitutional: He is oriented to person, place, and time. He appears well-developed and well-nourished.  HENT:  Head: Normocephalic and atraumatic.  Right Ear: Decreased hearing is noted.  Left Ear: Decreased hearing is noted.  Eyes: Conjunctivae are normal. Pupils are equal, round, and reactive to light.  Neck: Normal range of motion. Neck supple. No JVD present.  Cardiovascular: Normal rate and regular rhythm.   Pulmonary/Chest: Effort normal. He has no wheezes. He has no rales.  Abdominal:  Soft. He exhibits no distension. There is no tenderness.  Musculoskeletal: He exhibits no edema or tenderness.  Neurological: He is alert and oriented to person, place, and time.  Skin: Skin is warm and dry.  Psychiatric: He has a normal mood and affect. His behavior is normal. Thought content normal.  Nursing note and vitals reviewed.   Assessment & Plan:  1: Chronic heart failure with preserved ejection fraction- - NYHA Class II - euvolemic today - continuing to weigh daily Reminded to call for an overnight weight gain of >2 pounds or a weekly weight gain of >5 pounds. Weight down 10 pounds from his last visit here October 2017. He says that he's trying to decrease portion sizes and is rarely eating fast food anymore - did receive his flu/pneumonia vaccine for the season  - sees cardiologist [name]) 12/27/16  2: HTN-  - BP looks good today - sees his PCP [name]) 12/27/16  3: COPD- - Wearing oxygen at 3L only at bedtime  - No wheezing heard  4: Leg weakness- - continues to have some difficulty in rising from a seated position - recently in the hospital due to falls with evidence of an old stroke listed. Home health nurse coming out today and home  PT will also be coming - Walking without difficulty  Return in 3 months or sooner for any questions/problems before then.

## 2016-12-08 NOTE — Patient Instructions (Signed)
Continue weighing daily and call for an overnight weight gain of > 2 pounds or a weekly weight gain of >5 pounds. 

## 2017-01-07 NOTE — Progress Notes (Signed)
   12/02/16 1639  PT Time Calculation  PT Start Time (ACUTE ONLY) 1512  PT Stop Time (ACUTE ONLY) 1548  PT Time Calculation (min) (ACUTE ONLY) 36 min  PT G-Codes **NOT FOR INPATIENT CLASS**  Functional Assessment Tool Used clinical judgment  Functional Limitation Mobility: Walking and moving around  Mobility: Walking and Moving Around Current Status (V6160) CI  Mobility: Walking and Moving Around Goal Status (V3710) CI  PT General Charges  $$ ACUTE PT VISIT 1 Procedure  PT Evaluation  $PT Eval Low Complexity 1 Procedure  PT Treatments  $Therapeutic Exercise 8-22 mins   Late entry g-codes entered after review of initial evaluation by Cy Blamer, PT.  Reyes Ivan. Owens Shark, PT, DPT, NCS 01/07/17, 11:03 AM [phone]

## 2017-03-08 ENCOUNTER — Ambulatory Visit: Payer: Medicare PPO | Admitting: Family

## 2017-09-21 ENCOUNTER — Inpatient Hospital Stay
Admission: EM | Admit: 2017-09-21 | Discharge: 2017-09-24 | DRG: 190 | Disposition: A | Discharge status: Home / Self Care | Payer: Medicare PPO | Attending: Internal Medicine | Admitting: Internal Medicine

## 2017-09-21 ENCOUNTER — Emergency Department: Payer: Medicare PPO

## 2017-09-21 ENCOUNTER — Encounter: Payer: Self-pay | Admitting: Emergency Medicine

## 2017-09-21 DIAGNOSIS — Z66 Do not resuscitate: Secondary | ICD-10-CM | POA: Diagnosis present

## 2017-09-21 DIAGNOSIS — J209 Acute bronchitis, unspecified: Secondary | ICD-10-CM | POA: Diagnosis present

## 2017-09-21 DIAGNOSIS — E785 Hyperlipidemia, unspecified: Secondary | ICD-10-CM | POA: Diagnosis present

## 2017-09-21 DIAGNOSIS — I2510 Atherosclerotic heart disease of native coronary artery without angina pectoris: Secondary | ICD-10-CM | POA: Diagnosis present

## 2017-09-21 DIAGNOSIS — J441 Chronic obstructive pulmonary disease with (acute) exacerbation: Secondary | ICD-10-CM | POA: Diagnosis not present

## 2017-09-21 DIAGNOSIS — Z8701 Personal history of pneumonia (recurrent): Secondary | ICD-10-CM

## 2017-09-21 DIAGNOSIS — Z7951 Long term (current) use of inhaled steroids: Secondary | ICD-10-CM

## 2017-09-21 DIAGNOSIS — Z79899 Other long term (current) drug therapy: Secondary | ICD-10-CM

## 2017-09-21 DIAGNOSIS — Z8572 Personal history of non-Hodgkin lymphomas: Secondary | ICD-10-CM

## 2017-09-21 DIAGNOSIS — E1122 Type 2 diabetes mellitus with diabetic chronic kidney disease: Secondary | ICD-10-CM | POA: Diagnosis present

## 2017-09-21 DIAGNOSIS — Z7902 Long term (current) use of antithrombotics/antiplatelets: Secondary | ICD-10-CM

## 2017-09-21 DIAGNOSIS — N183 Chronic kidney disease, stage 3 (moderate): Secondary | ICD-10-CM | POA: Diagnosis present

## 2017-09-21 DIAGNOSIS — E1151 Type 2 diabetes mellitus with diabetic peripheral angiopathy without gangrene: Secondary | ICD-10-CM | POA: Diagnosis present

## 2017-09-21 DIAGNOSIS — Z7984 Long term (current) use of oral hypoglycemic drugs: Secondary | ICD-10-CM

## 2017-09-21 DIAGNOSIS — J440 Chronic obstructive pulmonary disease with acute lower respiratory infection: Secondary | ICD-10-CM | POA: Diagnosis present

## 2017-09-21 DIAGNOSIS — Z9221 Personal history of antineoplastic chemotherapy: Secondary | ICD-10-CM | POA: Diagnosis not present

## 2017-09-21 DIAGNOSIS — J449 Chronic obstructive pulmonary disease, unspecified: Secondary | ICD-10-CM | POA: Diagnosis present

## 2017-09-21 DIAGNOSIS — I129 Hypertensive chronic kidney disease with stage 1 through stage 4 chronic kidney disease, or unspecified chronic kidney disease: Secondary | ICD-10-CM | POA: Diagnosis present

## 2017-09-21 DIAGNOSIS — J9621 Acute and chronic respiratory failure with hypoxia: Secondary | ICD-10-CM | POA: Diagnosis present

## 2017-09-21 DIAGNOSIS — Z87891 Personal history of nicotine dependence: Secondary | ICD-10-CM | POA: Diagnosis not present

## 2017-09-21 DIAGNOSIS — Z7982 Long term (current) use of aspirin: Secondary | ICD-10-CM

## 2017-09-21 DIAGNOSIS — R0902 Hypoxemia: Secondary | ICD-10-CM

## 2017-09-21 DIAGNOSIS — N179 Acute kidney failure, unspecified: Secondary | ICD-10-CM | POA: Diagnosis present

## 2017-09-21 LAB — BLOOD GAS, VENOUS
Acid-Base Excess: 6.3 mmol/L — ABNORMAL HIGH (ref 0.0–2.0)
BICARBONATE: 32.2 mmol/L — AB (ref 20.0–28.0)
O2 Saturation: 84.2 %
PCO2 VEN: 52 mmHg (ref 44.0–60.0)
PH VEN: 7.4 (ref 7.250–7.430)
Patient temperature: 37
pO2, Ven: 49 mmHg — ABNORMAL HIGH (ref 32.0–45.0)

## 2017-09-21 LAB — COMPREHENSIVE METABOLIC PANEL
ALBUMIN: 3.5 g/dL (ref 3.5–5.0)
ALT: 10 U/L — ABNORMAL LOW (ref 17–63)
ANION GAP: 12 (ref 5–15)
AST: 17 U/L (ref 15–41)
Alkaline Phosphatase: 85 U/L (ref 38–126)
BUN: 29 mg/dL — ABNORMAL HIGH (ref 6–20)
CHLORIDE: 104 mmol/L (ref 101–111)
CO2: 26 mmol/L (ref 22–32)
Calcium: 8.1 mg/dL — ABNORMAL LOW (ref 8.9–10.3)
Creatinine, Ser: 1.8 mg/dL — ABNORMAL HIGH (ref 0.61–1.24)
GFR calc non Af Amer: 34 mL/min — ABNORMAL LOW (ref >60)
GFR, EST AFRICAN AMERICAN: 39 mL/min — AB (ref >60)
Glucose, Bld: 169 mg/dL — ABNORMAL HIGH (ref 65–99)
Potassium: 3.9 mmol/L (ref 3.5–5.1)
Sodium: 142 mmol/L (ref 135–145)
Total Bilirubin: 0.6 mg/dL (ref 0.3–1.2)
Total Protein: 6.9 g/dL (ref 6.5–8.1)

## 2017-09-21 LAB — CBC WITH DIFFERENTIAL/PLATELET
Basophils Absolute: 0.1 10*3/uL (ref 0–0.1)
Basophils Relative: 0 %
Eosinophils Absolute: 0.2 10*3/uL (ref 0–0.7)
Eosinophils Relative: 1 %
HEMATOCRIT: 35.2 % — AB (ref 40.0–52.0)
HEMOGLOBIN: 11.3 g/dL — AB (ref 13.0–18.0)
LYMPHS ABS: 0.6 10*3/uL — AB (ref 1.0–3.6)
Lymphocytes Relative: 5 %
MCH: 27.5 pg (ref 26.0–34.0)
MCHC: 32.2 g/dL (ref 32.0–36.0)
MCV: 85.3 fL (ref 80.0–100.0)
MONOS PCT: 8 %
Monocytes Absolute: 1 10*3/uL (ref 0.2–1.0)
NEUTROS ABS: 11.2 10*3/uL — AB (ref 1.4–6.5)
NEUTROS PCT: 86 %
Platelets: 256 10*3/uL (ref 150–440)
RBC: 4.13 MIL/uL — AB (ref 4.40–5.90)
RDW: 15.9 % — ABNORMAL HIGH (ref 11.5–14.5)
WBC: 13.1 10*3/uL — ABNORMAL HIGH (ref 3.8–10.6)

## 2017-09-21 LAB — PROTIME-INR
INR: 0.96
PROTHROMBIN TIME: 12.7 s (ref 11.4–15.2)

## 2017-09-21 LAB — TROPONIN I: Troponin I: <0.03 ng/mL (ref <0.03)

## 2017-09-21 LAB — LACTIC ACID, PLASMA: LACTIC ACID, VENOUS: 1.5 mmol/L (ref 0.5–1.9)

## 2017-09-21 MED ORDER — VANCOMYCIN HCL IN DEXTROSE 1-5 GM/200ML-% IV SOLN
1000.0000 mg | Freq: Once | INTRAVENOUS | Status: AC
  Administered 2017-09-21: 1000 mg via INTRAVENOUS
  Filled 2017-09-21: qty 200

## 2017-09-21 MED ORDER — VANCOMYCIN HCL 10 G IV SOLR
1250.0000 mg | INTRAVENOUS | Status: DC
  Administered 2017-09-22 – 2017-09-23 (×2): 1250 mg via INTRAVENOUS
  Filled 2017-09-21 (×2): qty 1250

## 2017-09-21 MED ORDER — PIPERACILLIN-TAZOBACTAM 3.375 G IVPB
3.3750 g | Freq: Three times a day (TID) | INTRAVENOUS | Status: DC
  Administered 2017-09-22 – 2017-09-23 (×5): 3.375 g via INTRAVENOUS
  Filled 2017-09-21 (×4): qty 50

## 2017-09-21 MED ORDER — IPRATROPIUM-ALBUTEROL 0.5-2.5 (3) MG/3ML IN SOLN
RESPIRATORY_TRACT | Status: AC
  Administered 2017-09-21: 6 mL
  Filled 2017-09-21: qty 6

## 2017-09-21 MED ORDER — ALBUTEROL SULFATE (2.5 MG/3ML) 0.083% IN NEBU
5.0000 mg | INHALATION_SOLUTION | Freq: Once | RESPIRATORY_TRACT | Status: AC
  Administered 2017-09-21: 5 mg via RESPIRATORY_TRACT
  Filled 2017-09-21: qty 6

## 2017-09-21 MED ORDER — METHYLPREDNISOLONE SODIUM SUCC 125 MG IJ SOLR
125.0000 mg | Freq: Once | INTRAMUSCULAR | Status: AC
  Administered 2017-09-21: 125 mg via INTRAVENOUS
  Filled 2017-09-21: qty 2

## 2017-09-21 MED ORDER — PIPERACILLIN-TAZOBACTAM 3.375 G IVPB 30 MIN
3.3750 g | Freq: Once | INTRAVENOUS | Status: AC
  Administered 2017-09-21: 3.375 g via INTRAVENOUS
  Filled 2017-09-21: qty 50

## 2017-09-21 NOTE — ED Notes (Signed)
Pt's neighbor, [name], left his phone number [phone]); Call [name] in the event of any updates or change in condition of the patient; pt aware and gave permission to have his medical information shared with his neighbor, [name].

## 2017-09-21 NOTE — Progress Notes (Signed)
Pharmacy Antibiotic Note  Brett [name] is a 82 y.o. male admitted on 09/21/2017 with pneumonia.  Pharmacy has been consulted for vanc/zosyn dosing.  Plan: Patient received vanc 1g IV and zosyn 3.375g IV x 1 in ED  Will f/u w/ vanc 1.25g IV q24h w/ 6 hour stacked dose. Will draw vanc trough 10/28 @ 0300 prior to 4th dose. Will continue zosyn 3.375g IV q8h.  Ke 0.0341 T1/2 20 ~ 24 hrs Goal trough 15 - 20 mcg/mL  Height: 6' (182.9 cm) Weight: 182 lb 6.4 oz (82.7 kg) IBW/kg (Calculated) : 77.6  Temp (24hrs), Avg:99.8 F (37.7 C), Min:99.8 F (37.7 C), Max:99.8 F (37.7 C)   Recent Labs Lab 09/21/17 2122 09/21/17 2137  WBC 13.1*  --   CREATININE 1.80*  --   LATICACIDVEN  --  1.5    Estimated Creatinine Clearance: 35.9 mL/min (A) (by C-G formula based on SCr of 1.8 mg/dL (H)).    No Known Allergies   Thank you for allowing pharmacy to be a part of this patient's care.  Tobie Lords, PharmD, BCPS Clinical Pharmacist 09/21/2017

## 2017-09-21 NOTE — ED Notes (Signed)
CODE SEPSIS CALLED TO Brett [name] AT [facility]

## 2017-09-21 NOTE — ED Provider Notes (Signed)
[facility] Emergency Department Provider Note  ____________________________________________  Time seen: Approximately 10:55 PM  I have reviewed the triage vital signs and the nursing notes.   HISTORY  Chief Complaint Shortness of Breath    HPI Brett [name] is a 82 y.o. male who complains of chest pain described as tightness and shortness of breath that started this afternoon. Constant, no radiation. Worse with movement. Feels like when he had pneumonia in the past. No alleviating factors. Moderate intensity.     Past Medical History:  Diagnosis Date  . Anemia   . Aortic regurgitation   . B-cell lymphoma ([place]) 2009   DX AT [facility]  . Bronchitis   . CAD (coronary artery disease)   . Carotid stenosis   . Diabetes mellitus, type 2 ([place])   . Emphysema of lung ([place])   . Essential hypertension   . History of chemotherapy   . Hyperlipidemia   . IDA (iron deficiency anemia)   . Leg edema   . Meralgia paraesthetica   . Mitral regurgitation   . PUD (peptic ulcer disease)   . PVD (peripheral vascular disease) ([place])   . Tobacco abuse      Patient Active Problem List   Diagnosis Date Noted  . Stroke ([place]) 12/02/2016  . Stroke (cerebrum) ([place]) 12/02/2016  . Leg weakness, bilateral 09/03/2016  . Chronic diastolic heart failure ([place]) 08/06/2016  . Pleural effusion 08/05/2016  . Sepsis ([place]) 07/10/2016  . HTN (hypertension) 07/10/2016  . Diabetes ([place]) 07/10/2016  . CAD (coronary artery disease) 07/10/2016  . COPD (chronic obstructive pulmonary disease) ([place]) 06/20/2016  . Chest pain 06/18/2016  . B-cell lymphoma ([place]) 10/14/2015     Past Surgical History:  Procedure Laterality Date  . CARDIAC CATHETERIZATION  08/15/2007  . CARDIAC CATHETERIZATION Right 07/13/2016   Procedure: Right/Left Heart Cath and Coronary Angiography;  Surgeon: Dionisio David, MD;  Location: [place] [facility];  Service: Cardiovascular;  Laterality: Right;  .  COLONOSCOPY  03/2013  . ESOPHAGOGASTRODUODENOSCOPY  03/2013     Prior to Admission medications   Medication Sig Start Date End Date Taking? Authorizing Provider  amLODipine (NORVASC) 5 MG tablet Take 5 mg by mouth daily.  08/27/15   [provider]  aspirin (ADULT ASPIRIN EC LOW STRENGTH) 81 MG EC tablet Take 1 tablet by mouth daily. 09/19/15   [provider]  atorvastatin (LIPITOR) 20 MG tablet Take 20 mg by mouth daily.    [provider]  clopidogrel (PLAVIX) 75 MG tablet Take 75 mg by mouth daily.  08/27/15   [provider]  Fluticasone-Salmeterol (ADVAIR DISKUS) 250-50 MCG/DOSE AEPB Inhale 1 puff into the lungs 2 times daily at 12 noon and 4 pm. 06/21/16   [name], MD  furosemide (LASIX) 40 MG tablet Take 1 tablet (40 mg total) by mouth 2 (two) times daily. 09/03/16   Alisa Graff, FNP  gabapentin (NEURONTIN) 100 MG capsule Take 1 capsule by mouth at bedtime.  09/22/15   [provider]  glimepiride (AMARYL) 1 MG tablet Take 1 mg by mouth daily with breakfast.    [provider]  hydrALAZINE (APRESOLINE) 25 MG tablet Take 2 tablets (50 mg total) by mouth 3 (three) times daily. 06/21/16   Gouru, Illene Silver, MD  hydrochlorothiazide (HYDRODIURIL) 25 MG tablet Take 25 mg by mouth daily. 09/16/15   [provider]  metoprolol (TOPROL-XL) 200 MG 24 hr tablet Take 1 tablet by mouth daily. 09/22/15   [provider]  senna-docusate (SENOKOT-S) 8.6-50 MG tablet Take 1 tablet by mouth 2 (two) times daily. Patient taking differently: Take 1 tablet by mouth 2 (two) times daily as needed.  06/21/16   Gouru, Illene Silver, MD  tiotropium (SPIRIVA HANDIHALER) 18 MCG inhalation capsule Place 1 capsule (18 mcg total) into inhaler and inhale daily. 07/05/16   Laverle Hobby, MD     Allergies Patient has no known allergies.   Family History  Problem Relation Age of Onset  . Rheum arthritis Neg Hx   . Osteoarthritis Neg Hx   . Asthma  Neg Hx   . Diabetes Neg Hx   . Cancer Neg Hx     Social History Social History  Substance Use Topics  . Smoking status: Former Smoker    Types: Cigarettes    Quit date: 08/30/2015  . Smokeless tobacco: Never Used  . Alcohol use No    Review of Systems  Constitutional:   No fever or chills.  ENT:   No sore throat. No rhinorrhea. Cardiovascular:   Positive as above chest pain syncope. Respiratory:   Positive shortness of breath and nonproductive cough. Gastrointestinal:   Negative for abdominal pain, vomiting and diarrhea.  Musculoskeletal:   Negative for focal pain or swelling All other systems reviewed and are negative except as documented above in ROS and HPI.  ____________________________________________   PHYSICAL EXAM:  VITAL SIGNS: ED Triage Vitals  Enc Vitals Group     BP 09/21/17 2111 130/68     Pulse Rate 09/21/17 2111 93     Resp 09/21/17 2111 (!) 23     Temp 09/21/17 2111 99.8 F (37.7 C)     Temp Source 09/21/17 2111 Axillary     SpO2 09/21/17 2110 97 %     Weight 09/21/17 2113 182 lb 6.4 oz (82.7 kg)     Height 09/21/17 2113 6' (1.829 m)     Head Circumference --      Peak Flow --      Pain Score --      Pain Loc --      Pain Edu? --      Excl. in [place]? --     Vital signs reviewed, nursing assessments reviewed.   Constitutional:   Alert and oriented. Not in distress Eyes:   No scleral icterus.  EOMI. No nystagmus. No conjunctival pallor. PERRL. ENT   Head:   Normocephalic and atraumatic.   Nose:   No congestion/rhinnorhea.    Mouth/Throat:   MMM, no pharyngeal erythema. No peritonsillar mass.    Neck:   No meningismus. Full ROM. Hematological/Lymphatic/Immunilogical:   No cervical lymphadenopathy. Cardiovascular:   Tachycardia heart rate 110. Symmetric bilateral radial and DP pulses.  No murmurs.  Respiratory:   Tachypnea, diminished breath sounds diffusely with expiratory wheezing. No focal crackles. Gastrointestinal:   Soft and  nontender. Non distended. There is no CVA tenderness.  No rebound, rigidity, or guarding. Genitourinary:   deferred Musculoskeletal:   Normal range of motion in all extremities. No joint effusions.  No lower extremity tenderness.  No edema. Neurologic:   Normal speech and language.  Motor grossly intact. No gross focal neurologic deficits are appreciated.  Skin:    Skin is warm, dry and intact. No rash noted.  No petechiae, purpura, or bullae.  ____________________________________________    LABS (pertinent positives/negatives) (all labs ordered are listed, but only abnormal results are displayed) Labs Reviewed  COMPREHENSIVE METABOLIC PANEL - Abnormal; Notable for the following:  Result Value   Glucose, Bld 169 (*)    BUN 29 (*)    Creatinine, Ser 1.80 (*)    Calcium 8.1 (*)    ALT 10 (*)    GFR calc non Af Amer 34 (*)    GFR calc Af Amer 39 (*)    All other components within normal limits  CBC WITH DIFFERENTIAL/PLATELET - Abnormal; Notable for the following:    WBC 13.1 (*)    RBC 4.13 (*)    Hemoglobin 11.3 (*)    HCT 35.2 (*)    RDW 15.9 (*)    Neutro Abs 11.2 (*)    Lymphs Abs 0.6 (*)    All other components within normal limits  BLOOD GAS, VENOUS - Abnormal; Notable for the following:    pO2, Ven 49.0 (*)    Bicarbonate 32.2 (*)    Acid-Base Excess 6.3 (*)    All other components within normal limits  CULTURE, BLOOD (ROUTINE X 2)  CULTURE, BLOOD (ROUTINE X 2)  PROTIME-INR  TROPONIN I  LACTIC ACID, PLASMA  URINALYSIS, COMPLETE (UACMP) WITH MICROSCOPIC  LACTIC ACID, PLASMA   ____________________________________________   EKG  Interpreted by me Sinus rhythm rate of 92, normal axis and intervals. Normal QRS ST segments and T waves.  ____________________________________________    [id number]  Dg Chest Portable 1 View  Result Date: 09/21/2017 CLINICAL DATA:  Shortness of Breath EXAM: PORTABLE CHEST 1 VIEW COMPARISON:  12/01/2016 FINDINGS: Cardiac  shadow is mildly enlarged but stable. Mild right basilar atelectasis is noted with associated pleural effusion. The left lung is clear. No acute bony abnormality is noted. IMPRESSION: Mild right basilar atelectasis with small effusion. Electronically Signed   By: Inez Catalina M.D.   On: 09/21/2017 21:28    ____________________________________________   PROCEDURES Procedures CRITICAL CARE Performed by: Joni Fears, Zariah Jost   Total critical care time: 35 minutes  Critical care time was exclusive of separately billable procedures and treating other patients.  Critical care was necessary to treat or prevent imminent or life-threatening deterioration.  Critical care was time spent personally by me on the following activities: development of treatment plan with patient and/or surrogate as well as nursing, discussions with consultants, evaluation of patient's response to treatment, examination of patient, obtaining history from patient or surrogate, ordering and performing treatments and interventions, ordering and review of laboratory studies, ordering and review of radiographic studies, pulse oximetry and re-evaluation of patient's condition.  ____________________________________________   DIFFERENTIAL DIAGNOSIS  Pneumonia, sepsis, pneumothorax, pulmonary embolism, COPD exacerbation, non-STEMI  CLINICAL IMPRESSION / ASSESSMENT AND PLAN / ED COURSE  Pertinent labs & imaging results that were available during my care of the patient were reviewed by me and considered in my medical decision making (see chart for details).   Patient presents with chest pain and shortness of breath. Vital signs are concerning for room air hypoxia of 88%, tachycardia to 110, relative fever in this 82 year old man, and tachypnea. Sepsis protocol initiated upon initial assessment. Empiric antibiotics with vancomycin and Zosyn. Lactate came back in normal range and blood pressure has remained stable, so does not  require large volume fluid boluses for resuscitation. Chest x-ray is unremarkable. Urinalysis pending. Case discussed with the hospitalist for further management.      ____________________________________________   FINAL CLINICAL IMPRESSION(S) / ED DIAGNOSES    Final diagnoses:  COPD exacerbation ([place])  Hypoxia      New Prescriptions   No medications on file     Portions of this  note were generated with dragon dictation software. Dictation errors may occur despite best attempts at proofreading.    Carrie Mew, MD 09/21/17 [phone]

## 2017-09-21 NOTE — ED Notes (Signed)
Per patient request, [name], pt's neighbor, was called to inform him that the patient has been set to be admitted to the hospital.

## 2017-09-21 NOTE — ED Notes (Signed)
Pt's O2 sats noted to be dropping to the high 80s, low 90s; pt's O2 increased to 4L via [place]. MD notified.

## 2017-09-21 NOTE — ED Triage Notes (Signed)
Pt presents to ED 04 via EMS from home c/o shortness of breath; per EMS pt has history of pneumonia, and pt stated that his chest pain feels like it did when he had pneumonia; per EMS pt's VS were respirations in the low 30s, Sinus Tach on the monitor, EKG was normal otherwise, CBG = 175; at this time, pt is awake, alert, and oriented x4, pt has inspiratory and expository wheezing on the right side, and expository wheezes on the left side.

## 2017-09-22 LAB — BASIC METABOLIC PANEL
Anion gap: 9 (ref 5–15)
BUN: 29 mg/dL — ABNORMAL HIGH (ref 6–20)
CHLORIDE: 105 mmol/L (ref 101–111)
CO2: 28 mmol/L (ref 22–32)
CREATININE: 1.75 mg/dL — AB (ref 0.61–1.24)
Calcium: 8 mg/dL — ABNORMAL LOW (ref 8.9–10.3)
GFR calc non Af Amer: 35 mL/min — ABNORMAL LOW (ref >60)
GFR, EST AFRICAN AMERICAN: 41 mL/min — AB (ref >60)
Glucose, Bld: 187 mg/dL — ABNORMAL HIGH (ref 65–99)
Potassium: 3.6 mmol/L (ref 3.5–5.1)
Sodium: 142 mmol/L (ref 135–145)

## 2017-09-22 LAB — BLOOD GAS, ARTERIAL
Acid-Base Excess: 2.3 mmol/L — ABNORMAL HIGH (ref 0.0–2.0)
Bicarbonate: 28.7 mmol/L — ABNORMAL HIGH (ref 20.0–28.0)
FIO2: 0.36
O2 SAT: 93.2 %
PATIENT TEMPERATURE: 37
PO2 ART: 71 mmHg — AB (ref 83.0–108.0)
pCO2 arterial: 52 mmHg — ABNORMAL HIGH (ref 32.0–48.0)
pH, Arterial: 7.35 (ref 7.350–7.450)

## 2017-09-22 LAB — GLUCOSE, CAPILLARY
GLUCOSE-CAPILLARY: 209 mg/dL — AB (ref 65–99)
GLUCOSE-CAPILLARY: 160 mg/dL — AB (ref 65–99)
Glucose-Capillary: 179 mg/dL — ABNORMAL HIGH (ref 65–99)
Glucose-Capillary: 260 mg/dL — ABNORMAL HIGH (ref 65–99)
Glucose-Capillary: 241 mg/dL — ABNORMAL HIGH (ref 65–99)

## 2017-09-22 LAB — CBC WITH DIFFERENTIAL/PLATELET
Basophils Absolute: 0 10*3/uL (ref 0–0.1)
Basophils Relative: 0 %
EOS ABS: 0 10*3/uL (ref 0–0.7)
Eosinophils Relative: 0 %
HEMATOCRIT: 34.3 % — AB (ref 40.0–52.0)
HEMOGLOBIN: 11 g/dL — AB (ref 13.0–18.0)
LYMPHS ABS: 0.3 10*3/uL — AB (ref 1.0–3.6)
Lymphocytes Relative: 2 %
MCH: 27.5 pg (ref 26.0–34.0)
MCHC: 32.1 g/dL (ref 32.0–36.0)
MCV: 85.6 fL (ref 80.0–100.0)
MONOS PCT: 4 %
Monocytes Absolute: 0.5 10*3/uL (ref 0.2–1.0)
NEUTROS ABS: 12.4 10*3/uL — AB (ref 1.4–6.5)
Neutrophils Relative %: 94 %
Platelets: 233 10*3/uL (ref 150–440)
RBC: 4 MIL/uL — ABNORMAL LOW (ref 4.40–5.90)
RDW: 16.2 % — ABNORMAL HIGH (ref 11.5–14.5)
WBC: 13.1 10*3/uL — ABNORMAL HIGH (ref 3.8–10.6)

## 2017-09-22 LAB — HEMOGLOBIN A1C
Hgb A1c MFr Bld: 6 % — ABNORMAL HIGH (ref 4.8–5.6)
Mean Plasma Glucose: 125.5 mg/dL

## 2017-09-22 LAB — LACTIC ACID, PLASMA: Lactic Acid, Venous: 1.6 mmol/L (ref 0.5–1.9)

## 2017-09-22 MED ORDER — HYDROCODONE-ACETAMINOPHEN 5-325 MG PO TABS: 1.0000 | ORAL_TABLET | ORAL | Status: DC | PRN

## 2017-09-22 MED ORDER — ONDANSETRON HCL 4 MG/2ML IJ SOLN: 4.0000 mg | Freq: Four times a day (QID) | INTRAMUSCULAR | Status: DC | PRN

## 2017-09-22 MED ORDER — INSULIN GLARGINE 100 UNIT/ML ~~LOC~~ SOLN
15.0000 [IU] | Freq: Every day | SUBCUTANEOUS | Status: DC
  Administered 2017-09-22 – 2017-09-23 (×3): 15 [IU] via SUBCUTANEOUS
  Filled 2017-09-22 (×4): qty 0.15

## 2017-09-22 MED ORDER — ACETAMINOPHEN 650 MG RE SUPP: 650.0000 mg | Freq: Four times a day (QID) | RECTAL | Status: DC | PRN

## 2017-09-22 MED ORDER — POLYETHYLENE GLYCOL 3350 17 G PO PACK: 17.0000 g | PACK | Freq: Every day | ORAL | Status: DC | PRN

## 2017-09-22 MED ORDER — IPRATROPIUM-ALBUTEROL 0.5-2.5 (3) MG/3ML IN SOLN
3.0000 mL | Freq: Three times a day (TID) | RESPIRATORY_TRACT | Status: DC
  Administered 2017-09-22 – 2017-09-24 (×6): 3 mL via RESPIRATORY_TRACT
  Filled 2017-09-22 (×6): qty 3

## 2017-09-22 MED ORDER — GUAIFENESIN ER 600 MG PO TB12
600.0000 mg | ORAL_TABLET | Freq: Two times a day (BID) | ORAL | Status: DC
  Administered 2017-09-22 – 2017-09-24 (×6): 600 mg via ORAL
  Filled 2017-09-22 (×6): qty 1

## 2017-09-22 MED ORDER — METHYLPREDNISOLONE SODIUM SUCC 125 MG IJ SOLR
60.0000 mg | Freq: Four times a day (QID) | INTRAMUSCULAR | Status: DC
  Administered 2017-09-22 – 2017-09-23 (×7): 60 mg via INTRAVENOUS
  Filled 2017-09-22 (×7): qty 2

## 2017-09-22 MED ORDER — LEVOFLOXACIN 500 MG PO TABS
250.0000 mg | ORAL_TABLET | Freq: Every day | ORAL | Status: DC
  Administered 2017-09-22 – 2017-09-23 (×2): 250 mg via ORAL
  Filled 2017-09-22 (×2): qty 1

## 2017-09-22 MED ORDER — SODIUM CHLORIDE 0.9 % IV SOLN
INTRAVENOUS | Status: DC
  Administered 2017-09-22 – 2017-09-24 (×4): via INTRAVENOUS

## 2017-09-22 MED ORDER — ASPIRIN EC 81 MG PO TBEC
81.0000 mg | DELAYED_RELEASE_TABLET | Freq: Every day | ORAL | Status: DC
  Administered 2017-09-22 – 2017-09-24 (×3): 81 mg via ORAL
  Filled 2017-09-22 (×3): qty 1

## 2017-09-22 MED ORDER — INSULIN ASPART 100 UNIT/ML ~~LOC~~ SOLN
0.0000 [IU] | Freq: Three times a day (TID) | SUBCUTANEOUS | Status: DC
Start: 2017-09-22 — End: 2017-09-22
  Administered 2017-09-22: 11 [IU] via SUBCUTANEOUS
  Administered 2017-09-22: 7 [IU] via SUBCUTANEOUS
  Filled 2017-09-22 (×2): qty 1

## 2017-09-22 MED ORDER — METOPROLOL SUCCINATE ER 50 MG PO TB24
200.0000 mg | ORAL_TABLET | Freq: Every day | ORAL | Status: DC
  Administered 2017-09-22 – 2017-09-24 (×3): 200 mg via ORAL
  Filled 2017-09-22 (×3): qty 4

## 2017-09-22 MED ORDER — HEPARIN SODIUM (PORCINE) 5000 UNIT/ML IJ SOLN
5000.0000 [IU] | Freq: Three times a day (TID) | INTRAMUSCULAR | Status: DC
  Administered 2017-09-22 – 2017-09-24 (×8): 5000 [IU] via SUBCUTANEOUS
  Filled 2017-09-22 (×8): qty 1

## 2017-09-22 MED ORDER — LEVOFLOXACIN 500 MG PO TABS
500.0000 mg | ORAL_TABLET | Freq: Once | ORAL | Status: AC
Start: 2017-09-22 — End: 2017-09-22
  Administered 2017-09-22: 500 mg via ORAL
  Filled 2017-09-22: qty 1

## 2017-09-22 MED ORDER — HYDRALAZINE HCL 50 MG PO TABS
50.0000 mg | ORAL_TABLET | Freq: Three times a day (TID) | ORAL | Status: DC
  Administered 2017-09-22 – 2017-09-24 (×7): 50 mg via ORAL
  Filled 2017-09-22 (×7): qty 1

## 2017-09-22 MED ORDER — ACETAMINOPHEN 325 MG PO TABS: 650.0000 mg | ORAL_TABLET | Freq: Four times a day (QID) | ORAL | Status: DC | PRN

## 2017-09-22 MED ORDER — SENNOSIDES-DOCUSATE SODIUM 8.6-50 MG PO TABS
1.0000 | ORAL_TABLET | Freq: Every day | ORAL | Status: DC
  Administered 2017-09-22 – 2017-09-23 (×3): 1 via ORAL
  Filled 2017-09-22 (×3): qty 1

## 2017-09-22 MED ORDER — IPRATROPIUM-ALBUTEROL 0.5-2.5 (3) MG/3ML IN SOLN
3.0000 mL | Freq: Four times a day (QID) | RESPIRATORY_TRACT | Status: DC
  Administered 2017-09-22 (×3): 3 mL via RESPIRATORY_TRACT
  Filled 2017-09-22 (×3): qty 3

## 2017-09-22 MED ORDER — AMLODIPINE BESYLATE 5 MG PO TABS
5.0000 mg | ORAL_TABLET | Freq: Every day | ORAL | Status: DC
Start: 2017-09-22 — End: 2017-09-24
  Administered 2017-09-22 – 2017-09-24 (×3): 5 mg via ORAL
  Filled 2017-09-22 (×3): qty 1

## 2017-09-22 MED ORDER — ATORVASTATIN CALCIUM 20 MG PO TABS
20.0000 mg | ORAL_TABLET | Freq: Every day | ORAL | Status: DC
  Administered 2017-09-22 – 2017-09-23 (×2): 20 mg via ORAL
  Filled 2017-09-22 (×2): qty 1

## 2017-09-22 MED ORDER — NICOTINE 7 MG/24HR TD PT24
7.0000 mg | MEDICATED_PATCH | Freq: Every day | TRANSDERMAL | Status: DC
  Administered 2017-09-22: 7 mg via TRANSDERMAL
  Filled 2017-09-22 (×3): qty 1

## 2017-09-22 MED ORDER — GABAPENTIN 100 MG PO CAPS
100.0000 mg | ORAL_CAPSULE | Freq: Every day | ORAL | Status: DC
  Administered 2017-09-22 – 2017-09-23 (×3): 100 mg via ORAL
  Filled 2017-09-22 (×3): qty 1

## 2017-09-22 MED ORDER — GLIMEPIRIDE 2 MG PO TABS
1.0000 mg | ORAL_TABLET | Freq: Every day | ORAL | Status: DC
  Administered 2017-09-22 – 2017-09-23 (×2): 1 mg via ORAL
  Filled 2017-09-22: qty 0.5
  Filled 2017-09-22: qty 1

## 2017-09-22 MED ORDER — ONDANSETRON HCL 4 MG PO TABS: 4.0000 mg | ORAL_TABLET | Freq: Four times a day (QID) | ORAL | Status: DC | PRN

## 2017-09-22 MED ORDER — CLOPIDOGREL BISULFATE 75 MG PO TABS
75.0000 mg | ORAL_TABLET | Freq: Every day | ORAL | Status: DC
  Administered 2017-09-22 – 2017-09-24 (×3): 75 mg via ORAL
  Filled 2017-09-22 (×3): qty 1

## 2017-09-22 MED ORDER — LEVOFLOXACIN 500 MG PO TABS: 750.0000 mg | ORAL_TABLET | Freq: Every day | ORAL | Status: DC

## 2017-09-22 MED ORDER — MOMETASONE FURO-FORMOTEROL FUM 200-5 MCG/ACT IN AERO
2.0000 | INHALATION_SPRAY | Freq: Two times a day (BID) | RESPIRATORY_TRACT | Status: DC
  Administered 2017-09-22 – 2017-09-24 (×6): 2 via RESPIRATORY_TRACT
  Filled 2017-09-22: qty 8.8

## 2017-09-22 MED ORDER — INSULIN ASPART 100 UNIT/ML ~~LOC~~ SOLN
0.0000 [IU] | Freq: Three times a day (TID) | SUBCUTANEOUS | Status: DC
  Administered 2017-09-22 – 2017-09-23 (×2): 3 [IU] via SUBCUTANEOUS
  Administered 2017-09-23 (×2): 2 [IU] via SUBCUTANEOUS
  Administered 2017-09-24: 1 [IU] via SUBCUTANEOUS
  Administered 2017-09-24: 3 [IU] via SUBCUTANEOUS
  Filled 2017-09-22 (×6): qty 1

## 2017-09-22 MED ORDER — LEVOFLOXACIN 500 MG PO TABS: 250.0000 mg | ORAL_TABLET | Freq: Every day | ORAL | Status: DC

## 2017-09-22 MED ORDER — ORAL CARE MOUTH RINSE: 15.0000 mL | Freq: Two times a day (BID) | OROMUCOSAL | Status: DC

## 2017-09-22 MED ORDER — INSULIN ASPART 100 UNIT/ML ~~LOC~~ SOLN
4.0000 [IU] | Freq: Three times a day (TID) | SUBCUTANEOUS | Status: DC
  Administered 2017-09-22 – 2017-09-23 (×3): 4 [IU] via SUBCUTANEOUS
  Filled 2017-09-22 (×3): qty 1

## 2017-09-22 MED ORDER — FUROSEMIDE 40 MG PO TABS
40.0000 mg | ORAL_TABLET | Freq: Two times a day (BID) | ORAL | Status: DC
  Administered 2017-09-22 – 2017-09-24 (×5): 40 mg via ORAL
  Filled 2017-09-22 (×5): qty 1

## 2017-09-22 NOTE — H&P (Signed)
[facility] NAME: Brett [name]    MR#:  [id number]  DATE OF BIRTH:  1935/10/16  DATE OF ADMISSION:  09/21/2017  PRIMARY CARE PHYSICIAN: Perrin Maltese, MD   REQUESTING/REFERRING PHYSICIAN:   CHIEF COMPLAINT:   Chief Complaint  Patient presents with  . Shortness of Breath    HISTORY OF PRESENT ILLNESS: Brett [name]  is a 82 y.o. male with below past medical history presenting to the emergency room with 1 day history of worsening shortness of breath, chest tightness made worse with deep breathing/coughing, subjective fevers, chills, sweating episodes, brought to the emergency room via EMS-respiratory rate noted to be in the 30s, tachycardia with heart rate 105 in the emergency room, ER workup noted for creatinine 1.8, BUN 29, white count 13,000, chest x-ray noted for right basilar atelectasis, EKG noted for sinus tachycardia, lactic acid level was normal, hospital service subsequently contacted for further evaluation/care given O2 saturation of 88% on room air on presentation, patient evaluated bedside emergency room, no apparent distress, resting comfortably in bed, patient now be admitted for acute on COPD exacerbation  PAST MEDICAL HISTORY:   Past Medical History:  Diagnosis Date  . Anemia   . Aortic regurgitation   . B-cell lymphoma ([place]) 2009   DX AT [facility]  . Bronchitis   . CAD (coronary artery disease)   . Carotid stenosis   . Diabetes mellitus, type 2 ([place])   . Emphysema of lung ([place])   . Essential hypertension   . History of chemotherapy   . Hyperlipidemia   . IDA (iron deficiency anemia)   . Leg edema   . Meralgia paraesthetica   . Mitral regurgitation   . PUD (peptic ulcer disease)   . PVD (peripheral vascular disease) ([place])   . Tobacco abuse     PAST SURGICAL HISTORY: Past Surgical History:  Procedure Laterality Date  . CARDIAC CATHETERIZATION  08/15/2007  . CARDIAC CATHETERIZATION Right 07/13/2016    Procedure: Right/Left Heart Cath and Coronary Angiography;  Surgeon: Dionisio David, MD;  Location: [facility];  Service: Cardiovascular;  Laterality: Right;  . COLONOSCOPY  03/2013  . ESOPHAGOGASTRODUODENOSCOPY  03/2013    SOCIAL HISTORY:  Social History  Substance Use Topics  . Smoking status: Former Smoker    Types: Cigarettes    Quit date: 08/30/2015  . Smokeless tobacco: Never Used  . Alcohol use No    FAMILY HISTORY:  Family History  Problem Relation Age of Onset  . Rheum arthritis Neg Hx   . Osteoarthritis Neg Hx   . Asthma Neg Hx   . Diabetes Neg Hx   . Cancer Neg Hx     DRUG ALLERGIES: No Known Allergies  REVIEW OF SYSTEMS:   CONSTITUTIONAL: Subjective fever, fatigue and generalized weakness.  EYES: No blurred or double vision.  EARS, NOSE, AND THROAT: No tinnitus or ear pain.  RESPIRATORY: Positive cough cough, shortness of breath, wheezing, no hemoptysis.  CARDIOVASCULAR: Chest tightness with deep breathing/coughing, no orthopnea, edema.  GASTROINTESTINAL: No nausea, vomiting, diarrhea or abdominal pain.  GENITOURINARY: No dysuria, hematuria.  ENDOCRINE: No polyuria, nocturia,  HEMATOLOGY: No anemia, easy bruising or bleeding SKIN: No rash or lesion. MUSCULOSKELETAL: No joint pain or arthritis.   NEUROLOGIC: No tingling, numbness, weakness.  PSYCHIATRY: No anxiety or depression.   MEDICATIONS AT HOME:  Prior to Admission medications   Medication Sig Start Date End Date Taking? Authorizing Provider  amLODipine (NORVASC) 5 MG  tablet Take 5 mg by mouth daily.  08/27/15   [provider]  aspirin (ADULT ASPIRIN EC LOW STRENGTH) 81 MG EC tablet Take 1 tablet by mouth daily. 09/19/15   [provider]  atorvastatin (LIPITOR) 20 MG tablet Take 20 mg by mouth daily.    [provider]  clopidogrel (PLAVIX) 75 MG tablet Take 75 mg by mouth daily.  08/27/15   [provider]  Fluticasone-Salmeterol (ADVAIR DISKUS) 250-50  MCG/DOSE AEPB Inhale 1 puff into the lungs 2 times daily at 12 noon and 4 pm. 06/21/16   [name], MD  furosemide (LASIX) 40 MG tablet Take 1 tablet (40 mg total) by mouth 2 (two) times daily. 09/03/16   Alisa Graff, FNP  gabapentin (NEURONTIN) 100 MG capsule Take 1 capsule by mouth at bedtime.  09/22/15   [provider]  glimepiride (AMARYL) 1 MG tablet Take 1 mg by mouth daily with breakfast.    [provider]  hydrALAZINE (APRESOLINE) 25 MG tablet Take 2 tablets (50 mg total) by mouth 3 (three) times daily. 06/21/16   Gouru, Illene Silver, MD  hydrochlorothiazide (HYDRODIURIL) 25 MG tablet Take 25 mg by mouth daily. 09/16/15   [provider]  metoprolol (TOPROL-XL) 200 MG 24 hr tablet Take 1 tablet by mouth daily. 09/22/15   [provider]  senna-docusate (SENOKOT-S) 8.6-50 MG tablet Take 1 tablet by mouth 2 (two) times daily. Patient taking differently: Take 1 tablet by mouth 2 (two) times daily as needed.  06/21/16   Gouru, Illene Silver, MD  tiotropium (SPIRIVA HANDIHALER) 18 MCG inhalation capsule Place 1 capsule (18 mcg total) into inhaler and inhale daily. 07/05/16   Laverle Hobby, MD      PHYSICAL EXAMINATION:   VITAL SIGNS: Blood pressure (!) 131/58, pulse 92, temperature 99.8 F (37.7 C), temperature source Axillary, resp. rate (!) 21, height 6' (1.829 m), weight 82.7 kg (182 lb 6.4 oz), SpO2 92 %.  GENERAL:  82 y.o.-year-old patient lying in the bed with no acute distress.  Frail appearing EYES: Pupils equal, round, reactive to light and accommodation. No scleral icterus. Extraocular muscles intact.  HEENT: Head atraumatic, normocephalic. Oropharynx and nasopharynx clear.  Heart of hearing NECK:  Supple, no jugular venous distention. No thyroid enlargement, no tenderness.  LUNGS: Diminished breath sounds bilaterally,  wheezing, rhonchi. No use of accessory muscles of respiration.  CARDIOVASCULAR: S1, S2 normal. No murmurs, rubs, or gallops.   ABDOMEN: Soft, nontender, nondistended. Bowel sounds present. No organomegaly or mass.  EXTREMITIES: No pedal edema, cyanosis, or clubbing.  NEUROLOGIC: Cranial nerves II through XII are intact. MAES. Gait not checked.  PSYCHIATRIC: The patient is alert and oriented x 3.  SKIN: No obvious rash, lesion, or ulcer.   LABORATORY PANEL:   CBC  Recent Labs Lab 09/21/17 2122  WBC 13.1*  HGB 11.3*  HCT 35.2*  PLT 256  MCV 85.3  MCH 27.5  MCHC 32.2  RDW 15.9*  LYMPHSABS 0.6*  MONOABS 1.0  EOSABS 0.2  BASOSABS 0.1   ------------------------------------------------------------------------------------------------------------------  Chemistries   Recent Labs Lab 09/21/17 2122  NA 142  K 3.9  CL 104  CO2 26  GLUCOSE 169*  BUN 29*  CREATININE 1.80*  CALCIUM 8.1*  AST 17  ALT 10*  ALKPHOS 85  BILITOT 0.6   ------------------------------------------------------------------------------------------------------------------ estimated creatinine clearance is 35.9 mL/min (A) (by C-G formula based on SCr of 1.8 mg/dL (H)). ------------------------------------------------------------------------------------------------------------------ No results for input(s): TSH, T4TOTAL, T3FREE, THYROIDAB in the last 72 hours.  Invalid input(s): FREET3   Coagulation profile  Recent Labs Lab 09/21/17 2122  INR 0.96   ------------------------------------------------------------------------------------------------------------------- No results for input(s): DDIMER in the last 72 hours. -------------------------------------------------------------------------------------------------------------------  Cardiac Enzymes  Recent Labs Lab 09/21/17 2122  TROPONINI <0.03   ------------------------------------------------------------------------------------------------------------------ Invalid input(s):  POCBNP  ---------------------------------------------------------------------------------------------------------------  Urinalysis    Component Value Date/Time   COLORURINE YELLOW (A) 12/01/2016 2132   APPEARANCEUR CLEAR (A) 12/01/2016 2132   LABSPEC 1.015 12/01/2016 2132   PHURINE 6.0 12/01/2016 2132   GLUCOSEU NEGATIVE 12/01/2016 2132   HGBUR NEGATIVE 12/01/2016 2132   BILIRUBINUR NEGATIVE 12/01/2016 2132   KETONESUR NEGATIVE 12/01/2016 2132   PROTEINUR 100 (A) 12/01/2016 2132   NITRITE NEGATIVE 12/01/2016 2132   LEUKOCYTESUR NEGATIVE 12/01/2016 2132     RADIOLOGY: Dg Chest Portable 1 View  Result Date: 09/21/2017 CLINICAL DATA:  Shortness of Breath EXAM: PORTABLE CHEST 1 VIEW COMPARISON:  12/01/2016 FINDINGS: Cardiac shadow is mildly enlarged but stable. Mild right basilar atelectasis is noted with associated pleural effusion. The left lung is clear. No acute bony abnormality is noted. IMPRESSION: Mild right basilar atelectasis with small effusion. Electronically Signed   By: Inez Catalina M.D.   On: 09/21/2017 21:28    EKG: Orders placed or performed during the hospital encounter of 09/21/17  . ED EKG  . ED EKG  . EKG 12-Lead  . EKG 12-Lead  . EKG 12-Lead  . EKG 12-Lead    IMPRESSION AND PLAN: 1 acute on COPD exacerbation Admit to MedSurg floor on her COPD protocol, empiric Avelox for 5-7-day course, IV Solu-Medrol with tapering as tolerated, inhaled corticosteroids twice daily, mucolytic agents, check sputum cultures, respiratory therapy to evaluate/treat, aggressive pulmonary toilet with bronchodilator therapy, continue close medical monitoring  2 acute SIRS Most likely secondary to above Lactic acid level was normal, white count 13,000, chest x-ray noted for right basilar atelectasis Antibiotics per above, check urinalysis, gentle IV fluids for rehydration, hold hydrochlorothiazide  3 chronic diabetes mellitus type 2 Stable Continue home regimen, start sliding  scale insulin with Accu-Cheks per routine, start Lantus 15 units subcu at bedtime while on steroids, check hemoglobin A1c to determine level of control, and continue close medical monitoring  4 chronic tobacco smoking abuse/dependency Nicotine patch daily and cessation counseling ordered  5 acute on chronic hypoxic respiratory failure Stable Patient chronically on 2 L via nasal cannula at night only Supplemental oxygen as needed and all other plans as stated above  6 history of B-cell lymphoma Status post chemotherapy in remote past Currently in remission   DNR status per patient wishes Condition stable Prognosis poor DVT prophylaxis with heparin subcu Disposition Home in 2-3 days  All the records are reviewed and case discussed with ED provider. Management plans discussed with the patient, family and they are in agreement.  CODE STATUS:    Code Status Orders        Start     Ordered   09/22/17 0037  Full code  Continuous     09/22/17 0036    Code Status History    Date Active Date Inactive Code Status Order ID Comments User Context   09/22/2017 12:33 AM 09/22/2017 12:36 AM DNR [id number]  Gorden Harms, MD ED   12/02/2016  8:12 AM 12/03/2016  8:09 PM Full Code [id number]  Vaughan Basta, MD Inpatient   07/11/2016 12:18 AM 07/11/2016  5:07 PM Full Code [id number]  Lance Coon, MD Inpatient   06/18/2016  3:40 AM 06/21/2016  6:44 PM Full  Code [id number]  Saundra Shelling, MD ED       TOTAL TIME TAKING CARE OF THIS PATIENT: 45 minutes.    Avel Peace Salary M.D on 09/22/2017   Between 7am to 6pm - Pager - [phone]  After 6pm go to [email or web address] - password [email or web address] [facility]  Office  [phone]  CC: Primary care physician; Perrin Maltese, MD   Note: This dictation was prepared with Dragon dictation along with smaller phrase technology. Any transcriptional errors that result from this process are unintentional.

## 2017-09-22 NOTE — Progress Notes (Signed)
Patient admitted for SOB found to have SIRS. Levaquin ordered for COPD exacerbation as well.  Pt's CrCl 35.9 ml/min.  Will start levaquin 500 mg PO x 1 then levaquin 250 mg daily for 4 days for a total of 5 days.  Tobie Lords, PharmD, BCPS Clinical Pharmacist 09/22/2017

## 2017-09-22 NOTE — Progress Notes (Addendum)
Brett [name] at [place] NAME: Brett [name]    MR#:  [id number]  DATE OF BIRTH:  [birth year]  SUBJECTIVE:  CHIEF COMPLAINT:  SOB is better  REVIEW OF SYSTEMS:  CONSTITUTIONAL: No fever, fatigue or weakness.  EYES: No blurred or double vision.  EARS, NOSE, AND THROAT: No tinnitus or ear pain.  RESPIRATORY: improved  cough, shortness of breath, no wheezing or hemoptysis.  CARDIOVASCULAR: No chest pain, orthopnea, edema.  GASTROINTESTINAL: No nausea, vomiting, diarrhea or abdominal pain.  GENITOURINARY: No dysuria, hematuria.  ENDOCRINE: No polyuria, nocturia,  HEMATOLOGY: No anemia, easy bruising or bleeding SKIN: No rash or lesion. MUSCULOSKELETAL: No joint pain or arthritis.   NEUROLOGIC: No tingling, numbness, weakness.  PSYCHIATRY: No anxiety or depression.   DRUG ALLERGIES:  No Known Allergies  VITALS:  Blood pressure 125/70, pulse (!) 107, temperature 98 F (36.7 C), temperature source Oral, resp. rate 20, height 6' (1.829 m), weight 82.7 kg (182 lb 6.4 oz), SpO2 91 %.  PHYSICAL EXAMINATION:  GENERAL:  82 y.o.-year-old patient lying in the bed with no acute distress.  EYES: Pupils equal, round, reactive to light and accommodation. No scleral icterus. Extraocular muscles intact.  HEENT: Head atraumatic, normocephalic. Oropharynx and nasopharynx clear.  NECK:  Supple, no jugular venous distention. No thyroid enlargement, no tenderness.  LUNGS: Mod breath sounds bilaterally, no wheezing, rales,rhonchi or crepitation. No use of accessory muscles of respiration.  CARDIOVASCULAR: S1, S2 normal. No murmurs, rubs, or gallops.  ABDOMEN: Soft, nontender, nondistended. Bowel sounds present. No organomegaly or mass.  EXTREMITIES: No pedal edema, cyanosis, or clubbing.  NEUROLOGIC: Cranial nerves II through XII are intact. Muscle strength 5/5 in all extremities. Sensation intact. Gait not checked.  PSYCHIATRIC: The patient is alert and  oriented x 3.  SKIN: No obvious rash, lesion, or ulcer.    LABORATORY PANEL:   CBC  Recent Labs Lab 09/22/17 0054  WBC 13.1*  HGB 11.0*  HCT 34.3*  PLT 233   ------------------------------------------------------------------------------------------------------------------  Chemistries   Recent Labs Lab 09/21/17 2122 09/22/17 0054  NA 142 142  K 3.9 3.6  CL 104 105  CO2 26 28  GLUCOSE 169* 187*  BUN 29* 29*  CREATININE 1.80* 1.75*  CALCIUM 8.1* 8.0*  AST 17  --   ALT 10*  --   ALKPHOS 85  --   BILITOT 0.6  --    ------------------------------------------------------------------------------------------------------------------  Cardiac Enzymes  Recent Labs Lab 09/21/17 2122  TROPONINI <0.03   ------------------------------------------------------------------------------------------------------------------  RADIOLOGY:  Dg Chest Portable 1 View  Result Date: 09/21/2017 CLINICAL DATA:  Shortness of Breath EXAM: PORTABLE CHEST 1 VIEW COMPARISON:  12/01/2016 FINDINGS: Cardiac shadow is mildly enlarged but stable. Mild right basilar atelectasis is noted with associated pleural effusion. The left lung is clear. No acute bony abnormality is noted. IMPRESSION: Mild right basilar atelectasis with small effusion. Electronically Signed   By: Inez Catalina M.D.   On: 09/21/2017 21:28    EKG:   Orders placed or performed during the hospital encounter of 09/21/17  . ED EKG  . ED EKG  . EKG 12-Lead  . EKG 12-Lead  . EKG 12-Lead  . EKG 12-Lead    ASSESSMENT AND PLAN:   1 acute on COPD exacerbation Continue empiric Avelox for 5-7-day course, IV Solu-Medrol with tapering as tolerated  inhaled corticosteroids twice daily, mucolytic agents check sputum cultures, respiratory therapy to evaluate/treat aggressive pulmonary toilet with bronchodilator therapy  2 acute SIRS Most likely  secondary to above Lactic acid level was normal, white count 13,000, chest x-ray noted  for right basilar atelectasis Antibiotics per above, gentle IV fluids for rehydration, hold hydrochlorothiazide  3 chronic diabetes mellitus type 2 Stable, A1c 6.0 Continue home regimen,  sliding scale insulin with Accu-Cheks per routine Lantus 15 units subcu at bedtime while on steroids   4 chronic tobacco smoking abuse/dependency Nicotine patch daily and cessation counseling ordered  5 acute on chronic hypoxic respiratory failure Stable Patient chronically on 2 L via nasal cannula at night only Supplemental oxygen as needed and all other plans as stated above  6 history of B-cell lymphoma Status post chemotherapy in remote past Currently in remission   DNR status per patient wishes     All the records are reviewed and case discussed with Care Management/Social Workerr. Management plans discussed with the patient, family and they are in agreement.  CODE STATUS: DNR   TOTAL TIME TAKING CARE OF THIS PATIENT: 35  minutes.   POSSIBLE D/C IN 1-2 DAYS, DEPENDING ON CLINICAL CONDITION.  Note: This dictation was prepared with Dragon dictation along with smaller phrase technology. Any transcriptional errors that result from this process are unintentional.   Brett [name] M.D on 09/22/2017 at 4:37 PM  Between 7am to 6pm - Pager - [phone] After 6pm go to [email or web address] - password [email or web address] [facility]  Office  [phone]  CC: Primary care physician; Perrin Maltese, MD

## 2017-09-22 NOTE — Progress Notes (Signed)
Inpatient Diabetes Program Recommendations  AACE/ADA: New Consensus Statement on Inpatient Glycemic Control (2015)  Target Ranges:  Prepandial:   less than 140 mg/dL      Peak postprandial:   less than 180 mg/dL (1-2 hours)      Critically ill patients:  140 - 180 mg/dL   Lab Results  Component Value Date   GLUCAP 209 (H) 09/22/2017   HGBA1C 6.0 (H) 09/22/2017    Review of Glycemic Control  Results for Brett [name], Brett [name] (MRN [id number]) as of 09/22/2017 13:57  Ref. Range 09/22/2017 01:05 09/22/2017 08:29 09/22/2017 11:57  Glucose-Capillary Latest Ref Range: 65 - 99 mg/dL 179 (H) 260 (H) 209 (H)    Diabetes history: Type 2 Outpatient Diabetes medications: Amaryl 1 mg qam  Current orders for Inpatient glycemic control: Amaryl 1mg  qam, Novolog 0-20 units tid, Lantus 15 units qhs  * Solu-medrol 60mg  q6h  Inpatient Diabetes Program Recommendations:  Please d/c Amaryl, decrease Novolog resistant correction to 0-9 units tid (based on GFR, BUN and creatinine) and add Novolog 5 units tid while patient is on steroids.   Add Novolog 0-5 units qhs  Gentry Fitz, RN, [place], [place], CDE Diabetes Coordinator Inpatient Diabetes Program  [phone] (Team Pager) [phone] ([place]) 09/22/2017 2:05 PM

## 2017-09-23 LAB — GLUCOSE, CAPILLARY
GLUCOSE-CAPILLARY: 151 mg/dL — AB (ref 65–99)
GLUCOSE-CAPILLARY: 230 mg/dL — AB (ref 65–99)
GLUCOSE-CAPILLARY: 157 mg/dL — AB (ref 65–99)
Glucose-Capillary: 181 mg/dL — ABNORMAL HIGH (ref 65–99)

## 2017-09-23 MED ORDER — METHYLPREDNISOLONE SODIUM SUCC 40 MG IJ SOLR
40.0000 mg | Freq: Two times a day (BID) | INTRAMUSCULAR | Status: DC
  Administered 2017-09-24 (×2): 40 mg via INTRAVENOUS
  Filled 2017-09-23 (×2): qty 1

## 2017-09-23 MED ORDER — LEVOFLOXACIN 500 MG PO TABS
750.0000 mg | ORAL_TABLET | ORAL | Status: DC
  Administered 2017-09-24: 750 mg via ORAL
  Filled 2017-09-23: qty 2

## 2017-09-23 MED ORDER — INSULIN ASPART 100 UNIT/ML ~~LOC~~ SOLN
6.0000 [IU] | Freq: Three times a day (TID) | SUBCUTANEOUS | Status: DC
  Administered 2017-09-23 – 2017-09-24 (×3): 6 [IU] via SUBCUTANEOUS
  Filled 2017-09-23 (×3): qty 1

## 2017-09-23 NOTE — Progress Notes (Signed)
Brett [name] at [place] NAME: Brett [name]    MR#:  [id number]  DATE OF BIRTH:  [birth year]  SUBJECTIVE:  CHIEF COMPLAINT:  SOB is better, still coughing and some wheezing  REVIEW OF SYSTEMS:  CONSTITUTIONAL: No fever, fatigue or weakness.  EYES: No blurred or double vision.  EARS, NOSE, AND THROAT: No tinnitus or ear pain.  RESPIRATORY: improved  cough, shortness of breath, reports minimal wheezing or hemoptysis.  CARDIOVASCULAR: No chest pain, orthopnea, edema.  GASTROINTESTINAL: No nausea, vomiting, diarrhea or abdominal pain.  GENITOURINARY: No dysuria, hematuria.  ENDOCRINE: No polyuria, nocturia,  HEMATOLOGY: No anemia, easy bruising or bleeding SKIN: No rash or lesion. MUSCULOSKELETAL: No joint pain or arthritis.   NEUROLOGIC: No tingling, numbness, weakness.  PSYCHIATRY: No anxiety or depression.   DRUG ALLERGIES:  No Known Allergies  VITALS:  Blood pressure 129/68, pulse 94, temperature 98 F (36.7 C), temperature source Oral, resp. rate 17, height 6' (1.829 m), weight 82.7 kg (182 lb 6.4 oz), SpO2 96 %.  PHYSICAL EXAMINATION:  GENERAL:  82 y.o.-year-old patient lying in the bed with no acute distress.  EYES: Pupils equal, round, reactive to light and accommodation. No scleral icterus. Extraocular muscles intact.  HEENT: Head atraumatic, normocephalic. Oropharynx and nasopharynx clear.  NECK:  Supple, no jugular venous distention. No thyroid enlargement, no tenderness.  LUNGS: Mod breath sounds bilaterally, minimal diffuse wheezing, no rales,rhonchi or crepitation. No use of accessory muscles of respiration.  CARDIOVASCULAR: S1, S2 normal. No murmurs, rubs, or gallops.  ABDOMEN: Soft, nontender, nondistended. Bowel sounds present. No organomegaly or mass.  EXTREMITIES: No pedal edema, cyanosis, or clubbing.  NEUROLOGIC: Cranial nerves II through XII are intact. Muscle strength 5/5 in all extremities. Sensation intact.  Gait not checked.  PSYCHIATRIC: The patient is alert and oriented x 3.  SKIN: No obvious rash, lesion, or ulcer.    LABORATORY PANEL:   CBC  Recent Labs Lab 09/22/17 0054  WBC 13.1*  HGB 11.0*  HCT 34.3*  PLT 233   ------------------------------------------------------------------------------------------------------------------  Chemistries   Recent Labs Lab 09/21/17 2122 09/22/17 0054  NA 142 142  K 3.9 3.6  CL 104 105  CO2 26 28  GLUCOSE 169* 187*  BUN 29* 29*  CREATININE 1.80* 1.75*  CALCIUM 8.1* 8.0*  AST 17  --   ALT 10*  --   ALKPHOS 85  --   BILITOT 0.6  --    ------------------------------------------------------------------------------------------------------------------  Cardiac Enzymes  Recent Labs Lab 09/21/17 2122  TROPONINI <0.03   ------------------------------------------------------------------------------------------------------------------  RADIOLOGY:  Dg Chest Portable 1 View  Result Date: 09/21/2017 CLINICAL DATA:  Shortness of Breath EXAM: PORTABLE CHEST 1 VIEW COMPARISON:  12/01/2016 FINDINGS: Cardiac shadow is mildly enlarged but stable. Mild right basilar atelectasis is noted with associated pleural effusion. The left lung is clear. No acute bony abnormality is noted. IMPRESSION: Mild right basilar atelectasis with small effusion. Electronically Signed   By: Inez Catalina M.D.   On: 09/21/2017 21:28    EKG:   Orders placed or performed during the hospital encounter of 09/21/17  . ED EKG  . ED EKG  . EKG 12-Lead  . EKG 12-Lead  . EKG 12-Lead  . EKG 12-Lead    ASSESSMENT AND PLAN:   1 acute on COPD exacerbation  clinically improving but still wheezing with exertion Continue empiric levofloxacin for 5-7-day course, tapering IV Solu-Medrol   inhaled corticosteroids twice daily, mucolytic agents check sputum cultures if they can  collect a sample   aggressive pulmonary toilet with bronchodilator therapy  2 acute bronchitis   Blood cultures are negative  Most likely secondary to above Lactic acid level was normal, white count 13,000, chest x-ray noted for right basilar atelectasis Antibiotics per above, gentle IV fluids for rehydration, hold hydrochlorothiazide  3 chronic diabetes mellitus type 2 Stable, A1c 6.0 Continue home regimen,  sliding scale insulin with Accu-Cheks per routine Lantus 15 units subcu at bedtime while on steroids   4 chronic tobacco smoking abuse/dependency Nicotine patch daily and cessation counseling ordered  5 acute on chronic hypoxic respiratory failure Stable Patient chronically on 2 L via nasal cannula at night only Supplemental oxygen as needed and all other plans as stated above  6 history of B-cell lymphoma Status post chemotherapy in remote past Currently in remission   DNR status per patient wishes     All the records are reviewed and case discussed with Care Management/Social Workerr. Management plans discussed with the patient, family and they are in agreement.  CODE STATUS: DNR   TOTAL TIME TAKING CARE OF THIS PATIENT: 35  minutes.   POSSIBLE D/C IN 1-2 DAYS, DEPENDING ON CLINICAL CONDITION.  Note: This dictation was prepared with Dragon dictation along with smaller phrase technology. Any transcriptional errors that result from this process are unintentional.   [name] M.D on 09/23/2017 at 3:52 PM  Between 7am to 6pm - Pager - [phone] After 6pm go to [email or web address] - password [email or web address] [facility]  Office  [phone]  CC: Primary care physician; Perrin Maltese, MD

## 2017-09-23 NOTE — Progress Notes (Signed)
Inpatient Diabetes Program Recommendations  AACE/ADA: New Consensus Statement on Inpatient Glycemic Control (2015)  Target Ranges:  Prepandial:   less than 140 mg/dL      Peak postprandial:   less than 180 mg/dL (1-2 hours)      Critically ill patients:  140 - 180 mg/dL   Lab Results  Component Value Date   GLUCAP 230 (H) 09/23/2017   HGBA1C 6.0 (H) 09/22/2017   Diabetes history: Type 2 Outpatient Diabetes medications: Amaryl 1 mg qam  Results for Brett [name], Brett [name] (MRN [id number]) as of 09/23/2017 12:56  Ref. Range 09/22/2017 11:57 09/22/2017 16:25 09/22/2017 21:03 09/23/2017 07:51 09/23/2017 11:56  Glucose-Capillary Latest Ref Range: 65 - 99 mg/dL 209 (H) 241 (H) 160 (H) 151 (H) 230 (H)    Current orders for Inpatient glycemic control: Amaryl 1mg  qam, Novolog 0-9 units tid, Lantus 15 units qhs, Novolog 4 units tid  * Solu-medrol 60mg  q6h  Inpatient Diabetes Program Recommendations:  Please d/c Amary and increase Novolog to 6 units tid while patient is on steroids.    Gentry Fitz, RN, BA, MHA, CDE Diabetes Coordinator Inpatient Diabetes Program  [phone] (Team Pager) [phone] ([place]) 09/23/2017 12:56 PM

## 2017-09-23 NOTE — Progress Notes (Signed)
Patient refused nicotine patch.

## 2017-09-24 LAB — GLUCOSE, CAPILLARY
GLUCOSE-CAPILLARY: 147 mg/dL — AB (ref 65–99)
Glucose-Capillary: 215 mg/dL — ABNORMAL HIGH (ref 65–99)

## 2017-09-24 MED ORDER — PREDNISONE 10 MG PO TABS: ORAL_TABLET | ORAL | 0 refills | Status: DC

## 2017-09-24 MED ORDER — NICOTINE 7 MG/24HR TD PT24: 7.0000 mg | MEDICATED_PATCH | Freq: Every day | TRANSDERMAL | 0 refills | Status: DC

## 2017-09-24 MED ORDER — ALBUTEROL SULFATE (2.5 MG/3ML) 0.083% IN NEBU: 2.5000 mg | INHALATION_SOLUTION | Freq: Four times a day (QID) | RESPIRATORY_TRACT | 0 refills | Status: DC | PRN

## 2017-09-24 MED ORDER — ATORVASTATIN CALCIUM 20 MG PO TABS: 20.0000 mg | ORAL_TABLET | Freq: Every day | ORAL | 0 refills | Status: DC

## 2017-09-24 MED ORDER — FUROSEMIDE 40 MG PO TABS: 40.0000 mg | ORAL_TABLET | Freq: Every day | ORAL | 0 refills | Status: DC

## 2017-09-24 MED ORDER — AMOXICILLIN-POT CLAVULANATE 500-125 MG PO TABS: 1.0000 | ORAL_TABLET | Freq: Two times a day (BID) | ORAL | 0 refills | Status: DC

## 2017-09-24 MED ORDER — ALBUTEROL SULFATE HFA 108 (90 BASE) MCG/ACT IN AERS: 2.0000 | INHALATION_SPRAY | Freq: Four times a day (QID) | RESPIRATORY_TRACT | 0 refills | Status: AC | PRN

## 2017-09-24 MED ORDER — TIOTROPIUM BROMIDE MONOHYDRATE 18 MCG IN CAPS: 18.0000 ug | ORAL_CAPSULE | Freq: Every day | RESPIRATORY_TRACT | 0 refills | Status: DC

## 2017-09-24 MED ORDER — METOPROLOL SUCCINATE ER 200 MG PO TB24
200.0000 mg | ORAL_TABLET | Freq: Every day | ORAL | 0 refills | Status: DC
Start: 2017-09-24 — End: 2019-12-05

## 2017-09-24 NOTE — Discharge Summary (Signed)
[facility] [place] NAME: Brett [name]    MR#:  [id number]  DATE OF BIRTH:  March 11, 1935  DATE OF ADMISSION:  09/21/2017 ADMITTING PHYSICIAN: Gorden Harms, MD  DATE OF DISCHARGE: 09/24/2017  PRIMARY CARE PHYSICIAN: Perrin Maltese, MD    ADMISSION DIAGNOSIS:  Hypoxia [R09.02] COPD exacerbation ([place]) [J44.1]  DISCHARGE DIAGNOSIS:  Active Problems:   COPD (chronic obstructive pulmonary disease) ([place])   SECONDARY DIAGNOSIS:   Past Medical History:  Diagnosis Date  . Anemia   . Aortic regurgitation   . B-cell lymphoma ([place]) 2009   DX AT [facility]  . Bronchitis   . CAD (coronary artery disease)   . Carotid stenosis   . Diabetes mellitus, type 2 ([place])   . Emphysema of lung ([place])   . Essential hypertension   . History of chemotherapy   . Hyperlipidemia   . IDA (iron deficiency anemia)   . Leg edema   . Meralgia paraesthetica   . Mitral regurgitation   . PUD (peptic ulcer disease)   . PVD (peripheral vascular disease) ([place])   . Tobacco abuse     HOSPITAL COURSE:   1.  Acute on now chronic respiratory failure with hypoxia.  The patient's pulse ox was in the 80s at rest.  Qualifies for home oxygen.  Case discussed with care manager to set this up.  Patient already has oxygen at night. 2.  COPD exacerbation.  Will give 2 more days of prednisone 40 mg and then stop.  Switched antibiotics to Augmentin.  Refilled nebulizer treatments albuterol inhaler and Spiriva.  Patient has Advair at home. 3.  Type 2 diabetes mellitus.  Patient's hemoglobin A1c is 6.0.  Sugars will be elevated while on steroids.  Patient received Lantus while here.  The patient is on diet control at home. 4.  Chronic kidney disease stage III.  Monitor as outpatient. 5.  Tobacco abuse nicotine patch prescribed 6.  History of B-cell lymphoma 7.  History of peripheral vascular disease 3.  Essential hypertension continue usual medications  DISCHARGE CONDITIONS:    Fair  CONSULTS OBTAINED:  None  DRUG ALLERGIES:  No Known Allergies  DISCHARGE MEDICATIONS:   Current Discharge Medication List    START taking these medications   Details  albuterol (PROVENTIL HFA;VENTOLIN HFA) 108 (90 Base) MCG/ACT inhaler Inhale 2 puffs into the lungs every 6 (six) hours as needed for wheezing or shortness of breath. Qty: 1 Inhaler, Refills: 0    albuterol (PROVENTIL) (2.5 MG/3ML) 0.083% nebulizer solution Take 3 mLs (2.5 mg total) by nebulization every 6 (six) hours as needed for wheezing or shortness of breath. Qty: 360 mL, Refills: 0    amoxicillin-clavulanate (AUGMENTIN) 500-125 MG tablet Take 1 tablet (500 mg total) by mouth 2 (two) times daily. Qty: 8 tablet, Refills: 0    nicotine (NICODERM CQ - DOSED IN MG/24 HR) 7 mg/24hr patch Place 1 patch (7 mg total) onto the skin daily. Qty: 28 patch, Refills: 0    predniSONE (DELTASONE) 10 MG tablet 4 tablets po daily for two days Qty: 8 tablet, Refills: 0      CONTINUE these medications which have CHANGED   Details  atorvastatin (LIPITOR) 20 MG tablet Take 1 tablet (20 mg total) by mouth daily at 6 PM. Qty: 30 tablet, Refills: 0    furosemide (LASIX) 40 MG tablet Take 1 tablet (40 mg total) by mouth daily. Qty: 30 tablet, Refills: 0    metoprolol (TOPROL-XL)  200 MG 24 hr tablet Take 1 tablet (200 mg total) by mouth daily. Qty: 30 tablet, Refills: 0    tiotropium (SPIRIVA HANDIHALER) 18 MCG inhalation capsule Place 1 capsule (18 mcg total) into inhaler and inhale daily. Qty: 30 capsule, Refills: 0      CONTINUE these medications which have NOT CHANGED   Details  amLODipine (NORVASC) 5 MG tablet Take 5 mg by mouth daily.     clopidogrel (PLAVIX) 75 MG tablet Take 75 mg by mouth daily.     eplerenone (INSPRA) 25 MG tablet Take 50 mg by mouth daily.    Fluticasone-Salmeterol (ADVAIR DISKUS) 250-50 MCG/DOSE AEPB Inhale 1 puff into the lungs 2 times daily at 12 noon and 4 pm. Qty: 1 each,  Refills: 0    gabapentin (NEURONTIN) 100 MG capsule Take 1 capsule by mouth 2 (two) times daily.     hydrALAZINE (APRESOLINE) 25 MG tablet Take 2 tablets (50 mg total) by mouth 3 (three) times daily. Qty: 90 tablet, Refills: 0    isosorbide dinitrate (ISORDIL) 30 MG tablet Take 30 mg by mouth daily.    pantoprazole (PROTONIX) 40 MG tablet Take 40 mg by mouth daily.    aspirin (ADULT ASPIRIN EC LOW STRENGTH) 81 MG EC tablet Take 1 tablet by mouth daily.    senna-docusate (SENOKOT-S) 8.6-50 MG tablet Take 1 tablet by mouth 2 (two) times daily.      STOP taking these medications     glimepiride (AMARYL) 1 MG tablet          DISCHARGE INSTRUCTIONS:   Follow-up PMD 1 week  If you experience worsening of your admission symptoms, develop shortness of breath, life threatening emergency, suicidal or homicidal thoughts you must seek medical attention immediately by calling 911 or calling your MD immediately  if symptoms less severe.  You Must read complete instructions/literature along with all the possible adverse reactions/side effects for all the Medicines you take and that have been prescribed to you. Take any new Medicines after you have completely understood and accept all the possible adverse reactions/side effects.   Please note  You were cared for by a hospitalist during your hospital stay. If you have any questions about your discharge medications or the care you received while you were in the hospital after you are discharged, you can call the unit and asked to speak with the hospitalist on call if the hospitalist that took care of you is not available. Once you are discharged, your primary care physician will handle any further medical issues. Please note that NO REFILLS for any discharge medications will be authorized once you are discharged, as it is imperative that you return to your primary care physician (or establish a relationship with a primary care physician if you do not  have one) for your aftercare needs so that they can reassess your need for medications and monitor your lab values.    Today   CHIEF COMPLAINT:   Chief Complaint  Patient presents with  . Shortness of Breath    HISTORY OF PRESENT ILLNESS:  Brett [name]  is a 82 y.o. male presented with shortness of breath.   VITAL SIGNS:  Blood pressure 128/63, pulse 86, temperature 97.8 F (36.6 C), temperature source Oral, resp. rate 16, height 6' (1.829 m), weight 82.7 kg (182 lb 6.4 oz), SpO2 93 %.    PHYSICAL EXAMINATION:  GENERAL:  82 y.o.-year-old patient lying in the bed with no acute distress.  EYES: Pupils equal, round,  reactive to light and accommodation. No scleral icterus. Extraocular muscles intact.  HEENT: Head atraumatic, normocephalic. Oropharynx and nasopharynx clear.  NECK:  Supple, no jugular venous distention. No thyroid enlargement, no tenderness.  LUNGS: decreased breath sounds bilaterally, no wheezing, rales,rhonchi or crepitation. No use of accessory muscles of respiration.  CARDIOVASCULAR: S1, S2 normal. No murmurs, rubs, or gallops.  ABDOMEN: Soft, non-tender, non-distended. Bowel sounds present. No organomegaly or mass.  EXTREMITIES: No pedal edema, cyanosis, or clubbing.  NEUROLOGIC: Cranial nerves II through XII are intact. Muscle strength 5/5 in all extremities. Sensation intact. Gait not checked.  PSYCHIATRIC: The patient is alert and oriented x 3.  SKIN: No obvious rash, lesion, or ulcer.   DATA REVIEW:   CBC  Recent Labs Lab 09/22/17 0054  WBC 13.1*  HGB 11.0*  HCT 34.3*  PLT 233    Chemistries   Recent Labs Lab 09/21/17 2122 09/22/17 0054  NA 142 142  K 3.9 3.6  CL 104 105  CO2 26 28  GLUCOSE 169* 187*  BUN 29* 29*  CREATININE 1.80* 1.75*  CALCIUM 8.1* 8.0*  AST 17  --   ALT 10*  --   ALKPHOS 85  --   BILITOT 0.6  --     Cardiac Enzymes  Recent Labs Lab 09/21/17 2122  TROPONINI <0.03    Microbiology Results  Results  for orders placed or performed during the hospital encounter of 09/21/17  Culture, blood (Routine x 2)     Status: None (Preliminary result)   Collection Time: 09/21/17  9:22 PM  Result Value Ref Range Status   Specimen Description BLOOD LT HAND  Final   Special Requests   Final    BOTTLES DRAWN AEROBIC AND ANAEROBIC Blood Culture adequate volume   Culture NO GROWTH 2 DAYS  Final   Report Status PENDING  Incomplete  Culture, blood (Routine x 2)     Status: None (Preliminary result)   Collection Time: 09/21/17  9:22 PM  Result Value Ref Range Status   Specimen Description BLOOD LT [facility]  Final   Special Requests   Final    BOTTLES DRAWN AEROBIC AND ANAEROBIC Blood Culture adequate volume   Culture NO GROWTH 2 DAYS  Final   Report Status PENDING  Incomplete     Management plans discussed with the patient, family and they are in agreement.  CODE STATUS:     Code Status Orders        Start     Ordered   09/22/17 0037  Full code  Continuous     09/22/17 0036    Code Status History    Date Active Date Inactive Code Status Order ID Comments User Context   09/22/2017 12:33 AM 09/22/2017 12:36 AM DNR [id number]  SalaryAvel Peace, MD ED   12/02/2016  8:12 AM 12/03/2016  8:09 PM Full Code [id number]  Vaughan Basta, MD Inpatient   07/11/2016 12:18 AM 07/11/2016  5:07 PM Full Code [id number]  Lance Coon, MD Inpatient   06/18/2016  3:40 AM 06/21/2016  6:44 PM Full Code [id number]  Saundra Shelling, MD ED      TOTAL TIME TAKING CARE OF THIS PATIENT: 35 minutes.    Loletha Grayer M.D on 09/24/2017 at 1:02 PM  Between 7am to 6pm - Pager - [phone]  After 6pm go to [email or web address] - password [email or web address] [facility]  [phone]  CC: Primary care physician; Perrin Maltese, MD

## 2017-09-24 NOTE — Progress Notes (Signed)
SATURATION QUALIFICATIONS:  Patient Saturations on Room Air at Rest = 88%  Patient Saturations on [employer] while Ambulating = 84%  Patient Saturations on 2 Liters of oxygen while Ambulating = 93%  Please briefly explain why patient needs home oxygen: Drop in O2 sats

## 2017-09-24 NOTE — Care Management Note (Signed)
Case Management Note  Patient Details  Name: Brett [name] MRN: [id number] Date of Birth: 1935-05-09  Subjective/Objective:  Called new 02 Sats and MD order for continuous 02 to Brett [name] at [facility]. Brett [name] is already on nocturnal 02 with [facility].                   Action/Plan:   Expected Discharge Date:  09/24/17               Expected Discharge Plan:  Home/Self Care  In-House Referral:  NA  Discharge planning Services  CM Consult  Post Acute Care Choice:  Durable Medical Equipment Choice offered to:  Patient  DME Arranged:  Oxygen (Increase from nocturnal 02 to continuous 02) DME Agency:  [place]:  NA [place] Agency:  NA  Status of Service:  Completed, signed off  If discussed at [place] of Stay Meetings, dates discussed:    Additional Comments:  Harlan Ervine A, RN 09/24/2017, 9:12 AM

## 2017-09-24 NOTE — Progress Notes (Signed)
Pt alert and oriented. IVs removed. Pt on 2L O2. No complaints at this time. Discharge instructions and prescriptions given to pt. Pt's ride here with portable oxygen tank. Pt ready for discharge.

## 2017-09-24 NOTE — Discharge Instructions (Signed)
Chronic Obstructive Pulmonary Disease Exacerbation Chronic obstructive pulmonary disease (COPD) is a common lung condition in which airflow from the lungs is limited. COPD is a general term that can be used to describe many different lung problems that limit airflow, including chronic bronchitis and emphysema. COPD exacerbations are episodes when breathing symptoms become much worse and require extra treatment. Without treatment, COPD exacerbations can be life threatening, and frequent COPD exacerbations can cause further damage to your lungs. What are the causes?  Respiratory infections.  Exposure to smoke.  Exposure to air pollution, chemical fumes, or dust. Sometimes there is no apparent cause or trigger. What increases the risk?  Smoking cigarettes.  Older age.  Frequent prior COPD exacerbations. What are the signs or symptoms?  Increased coughing.  Increased thick spit (sputum) production.  Increased wheezing.  Increased shortness of breath.  Rapid breathing.  Chest tightness. How is this diagnosed? Your medical history, a physical exam, and tests will help your health care provider make a diagnosis. Tests may include:  A chest X-ray.  Basic lab tests.  Sputum testing.  An arterial blood gas test.  How is this treated? Depending on the severity of your COPD exacerbation, you may need to be admitted to a hospital for treatment. Some of the treatments commonly used to treat COPD exacerbations are:  Antibiotic medicines.  Bronchodilators. These are drugs that expand the air passages. They may be given with an inhaler or nebulizer. Spacer devices may be needed to help improve drug delivery.  Corticosteroid medicines.  Supplemental oxygen therapy.  Airway clearing techniques, such as noninvasive ventilation (NIV) and positive expiratory pressure (PEP). These provide respiratory support through a mask or other noninvasive device.  Follow these instructions at  home:  Do not smoke. Quitting smoking is very important to prevent COPD from getting worse and exacerbations from happening as often.  Avoid exposure to all substances that irritate the airway, especially to tobacco smoke.  If you were prescribed an antibiotic medicine, finish it all even if you start to feel better.  Take all medicines as directed by your health care provider.It is important to use correct technique with inhaled medicines.  Drink enough fluids to keep your urine clear or pale yellow (unless you have a medical condition that requires fluid restriction).  Use a cool mist vaporizer. This makes it easier to clear your chest when you cough.  If you have a home nebulizer and oxygen, continue to use them as directed.  Maintain all necessary vaccinations to prevent infections.  Exercise regularly.  Eat a healthy diet.  Keep all follow-up appointments as directed by your health care provider. Get help right away if:  You have worsening shortness of breath.  You have trouble talking.  You have severe chest pain.  You have blood in your sputum.  You have a fever.  You have weakness, vomit repeatedly, or faint.  You feel confused.  You continue to get worse. This information is not intended to replace advice given to you by your health care provider. Make sure you discuss any questions you have with your health care provider. Document Released: 09/12/2007 Document Revised: 04/22/2016 Document Reviewed: 07/20/2013 Elsevier Interactive Patient Education  2017 [employer]  

## 2017-09-27 LAB — CULTURE, BLOOD (ROUTINE X 2)
CULTURE: NO GROWTH
Culture: NO GROWTH
Special Requests: ADEQUATE
Special Requests: ADEQUATE

## 2017-11-13 ENCOUNTER — Emergency Department: Payer: Medicare PPO

## 2017-11-13 ENCOUNTER — Encounter: Payer: Self-pay | Admitting: Emergency Medicine

## 2017-11-13 ENCOUNTER — Other Ambulatory Visit: Payer: Self-pay

## 2017-11-13 ENCOUNTER — Inpatient Hospital Stay
Admission: EM | Admit: 2017-11-13 | Discharge: 2017-11-16 | DRG: 193 | Disposition: A | Discharge status: Home / Self Care | Payer: Medicare PPO | Attending: Internal Medicine | Admitting: Internal Medicine

## 2017-11-13 DIAGNOSIS — Z79899 Other long term (current) drug therapy: Secondary | ICD-10-CM

## 2017-11-13 DIAGNOSIS — Z9221 Personal history of antineoplastic chemotherapy: Secondary | ICD-10-CM | POA: Diagnosis not present

## 2017-11-13 DIAGNOSIS — Z7984 Long term (current) use of oral hypoglycemic drugs: Secondary | ICD-10-CM | POA: Diagnosis not present

## 2017-11-13 DIAGNOSIS — I10 Essential (primary) hypertension: Secondary | ICD-10-CM | POA: Diagnosis present

## 2017-11-13 DIAGNOSIS — Z8572 Personal history of non-Hodgkin lymphomas: Secondary | ICD-10-CM

## 2017-11-13 DIAGNOSIS — J9601 Acute respiratory failure with hypoxia: Secondary | ICD-10-CM

## 2017-11-13 DIAGNOSIS — Z8711 Personal history of peptic ulcer disease: Secondary | ICD-10-CM

## 2017-11-13 DIAGNOSIS — Z7902 Long term (current) use of antithrombotics/antiplatelets: Secondary | ICD-10-CM | POA: Diagnosis not present

## 2017-11-13 DIAGNOSIS — I080 Rheumatic disorders of both mitral and aortic valves: Secondary | ICD-10-CM | POA: Diagnosis present

## 2017-11-13 DIAGNOSIS — E119 Type 2 diabetes mellitus without complications: Secondary | ICD-10-CM

## 2017-11-13 DIAGNOSIS — Z7951 Long term (current) use of inhaled steroids: Secondary | ICD-10-CM

## 2017-11-13 DIAGNOSIS — E1151 Type 2 diabetes mellitus with diabetic peripheral angiopathy without gangrene: Secondary | ICD-10-CM | POA: Diagnosis present

## 2017-11-13 DIAGNOSIS — R6510 Systemic inflammatory response syndrome (SIRS) of non-infectious origin without acute organ dysfunction: Secondary | ICD-10-CM

## 2017-11-13 DIAGNOSIS — I2510 Atherosclerotic heart disease of native coronary artery without angina pectoris: Secondary | ICD-10-CM | POA: Diagnosis present

## 2017-11-13 DIAGNOSIS — J440 Chronic obstructive pulmonary disease with acute lower respiratory infection: Secondary | ICD-10-CM | POA: Diagnosis present

## 2017-11-13 DIAGNOSIS — E785 Hyperlipidemia, unspecified: Secondary | ICD-10-CM | POA: Diagnosis present

## 2017-11-13 DIAGNOSIS — J189 Pneumonia, unspecified organism: Secondary | ICD-10-CM | POA: Diagnosis present

## 2017-11-13 DIAGNOSIS — Y95 Nosocomial condition: Secondary | ICD-10-CM | POA: Diagnosis present

## 2017-11-13 DIAGNOSIS — Z87891 Personal history of nicotine dependence: Secondary | ICD-10-CM | POA: Diagnosis not present

## 2017-11-13 DIAGNOSIS — Z66 Do not resuscitate: Secondary | ICD-10-CM | POA: Diagnosis present

## 2017-11-13 DIAGNOSIS — J9600 Acute respiratory failure, unspecified whether with hypoxia or hypercapnia: Secondary | ICD-10-CM

## 2017-11-13 LAB — BRAIN NATRIURETIC PEPTIDE: B Natriuretic Peptide: 91 pg/mL (ref 0.0–100.0)

## 2017-11-13 LAB — CBC WITH DIFFERENTIAL/PLATELET
Basophils Absolute: 0.1 10*3/uL (ref 0–0.1)
Basophils Relative: 1 %
Eosinophils Absolute: 0 10*3/uL (ref 0–0.7)
Eosinophils Relative: 0 %
HEMATOCRIT: 35.3 % — AB (ref 40.0–52.0)
Hemoglobin: 11.5 g/dL — ABNORMAL LOW (ref 13.0–18.0)
LYMPHS ABS: 0.8 10*3/uL — AB (ref 1.0–3.6)
LYMPHS PCT: 4 %
MCH: 27.3 pg (ref 26.0–34.0)
MCHC: 32.5 g/dL (ref 32.0–36.0)
MCV: 84.2 fL (ref 80.0–100.0)
MONO ABS: 1.5 10*3/uL — AB (ref 0.2–1.0)
MONOS PCT: 8 %
NEUTROS ABS: 16.8 10*3/uL — AB (ref 1.4–6.5)
Neutrophils Relative %: 87 %
Platelets: 217 10*3/uL (ref 150–440)
RBC: 4.2 MIL/uL — ABNORMAL LOW (ref 4.40–5.90)
RDW: 17.1 % — AB (ref 11.5–14.5)
WBC: 19.3 10*3/uL — ABNORMAL HIGH (ref 3.8–10.6)

## 2017-11-13 LAB — COMPREHENSIVE METABOLIC PANEL
ALBUMIN: 3.5 g/dL (ref 3.5–5.0)
ALK PHOS: 78 U/L (ref 38–126)
ALT: 15 U/L — ABNORMAL LOW (ref 17–63)
ANION GAP: 11 (ref 5–15)
AST: 23 U/L (ref 15–41)
BUN: 22 mg/dL — ABNORMAL HIGH (ref 6–20)
CHLORIDE: 102 mmol/L (ref 101–111)
CO2: 27 mmol/L (ref 22–32)
Calcium: 8.2 mg/dL — ABNORMAL LOW (ref 8.9–10.3)
Creatinine, Ser: 1.64 mg/dL — ABNORMAL HIGH (ref 0.61–1.24)
GFR calc Af Amer: 44 mL/min — ABNORMAL LOW (ref >60)
GFR calc non Af Amer: 38 mL/min — ABNORMAL LOW (ref >60)
GLUCOSE: 160 mg/dL — AB (ref 65–99)
POTASSIUM: 4.2 mmol/L (ref 3.5–5.1)
SODIUM: 140 mmol/L (ref 135–145)
Total Bilirubin: 0.8 mg/dL (ref 0.3–1.2)
Total Protein: 6.6 g/dL (ref 6.5–8.1)

## 2017-11-13 LAB — GLUCOSE, CAPILLARY: GLUCOSE-CAPILLARY: 245 mg/dL — AB (ref 65–99)

## 2017-11-13 LAB — LACTIC ACID, PLASMA
Lactic Acid, Venous: 4 mmol/L (ref 0.5–1.9)
Lactic Acid, Venous: 5.9 mmol/L (ref 0.5–1.9)

## 2017-11-13 LAB — TROPONIN I: Troponin I: 0.04 ng/mL (ref <0.03)

## 2017-11-13 MED ORDER — DOCUSATE SODIUM 100 MG PO CAPS
100.0000 mg | ORAL_CAPSULE | Freq: Two times a day (BID) | ORAL | Status: DC
  Administered 2017-11-13 – 2017-11-15 (×5): 100 mg via ORAL
  Filled 2017-11-13 (×6): qty 1

## 2017-11-13 MED ORDER — DEXTROSE 5 % IV SOLN
2.0000 g | Freq: Once | INTRAVENOUS | Status: AC
  Administered 2017-11-13: 2 g via INTRAVENOUS
  Filled 2017-11-13: qty 2

## 2017-11-13 MED ORDER — FUROSEMIDE 40 MG PO TABS
40.0000 mg | ORAL_TABLET | Freq: Every day | ORAL | Status: DC
  Administered 2017-11-14 – 2017-11-16 (×3): 40 mg via ORAL
  Filled 2017-11-13 (×3): qty 1

## 2017-11-13 MED ORDER — SODIUM CHLORIDE 0.9 % IV SOLN
INTRAVENOUS | Status: DC
  Administered 2017-11-13 – 2017-11-14 (×3): via INTRAVENOUS

## 2017-11-13 MED ORDER — HYDRALAZINE HCL 50 MG PO TABS
50.0000 mg | ORAL_TABLET | Freq: Two times a day (BID) | ORAL | Status: DC
  Administered 2017-11-13 – 2017-11-16 (×6): 50 mg via ORAL
  Filled 2017-11-13 (×6): qty 1

## 2017-11-13 MED ORDER — IPRATROPIUM-ALBUTEROL 0.5-2.5 (3) MG/3ML IN SOLN
3.0000 mL | Freq: Once | RESPIRATORY_TRACT | Status: AC
  Administered 2017-11-13: 3 mL via RESPIRATORY_TRACT
  Filled 2017-11-13: qty 3

## 2017-11-13 MED ORDER — HEPARIN SODIUM (PORCINE) 5000 UNIT/ML IJ SOLN
5000.0000 [IU] | Freq: Three times a day (TID) | INTRAMUSCULAR | Status: DC
  Administered 2017-11-13 – 2017-11-16 (×8): 5000 [IU] via SUBCUTANEOUS
  Filled 2017-11-13 (×8): qty 1

## 2017-11-13 MED ORDER — SODIUM CHLORIDE 0.9 % IV BOLUS (SEPSIS)
500.0000 mL | Freq: Once | INTRAVENOUS | Status: AC
Start: 2017-11-13 — End: 2017-11-13
  Administered 2017-11-13: 500 mL via INTRAVENOUS

## 2017-11-13 MED ORDER — ONDANSETRON HCL 4 MG/2ML IJ SOLN: 4.0000 mg | Freq: Four times a day (QID) | INTRAMUSCULAR | Status: DC | PRN

## 2017-11-13 MED ORDER — SODIUM CHLORIDE 0.9 % IV BOLUS (SEPSIS)
500.0000 mL | Freq: Once | INTRAVENOUS | Status: AC
  Administered 2017-11-13: 500 mL via INTRAVENOUS

## 2017-11-13 MED ORDER — GLIMEPIRIDE 1 MG PO TABS
1.0000 mg | ORAL_TABLET | Freq: Two times a day (BID) | ORAL | Status: DC
  Administered 2017-11-13 – 2017-11-15 (×4): 1 mg via ORAL
  Filled 2017-11-13 (×5): qty 1

## 2017-11-13 MED ORDER — CLOPIDOGREL BISULFATE 75 MG PO TABS
75.0000 mg | ORAL_TABLET | Freq: Every day | ORAL | Status: DC
  Administered 2017-11-14 – 2017-11-16 (×3): 75 mg via ORAL
  Filled 2017-11-13 (×3): qty 1

## 2017-11-13 MED ORDER — INSULIN ASPART 100 UNIT/ML ~~LOC~~ SOLN
0.0000 [IU] | Freq: Three times a day (TID) | SUBCUTANEOUS | Status: DC
  Administered 2017-11-14: 3 [IU] via SUBCUTANEOUS
  Administered 2017-11-14 (×2): 5 [IU] via SUBCUTANEOUS
  Administered 2017-11-15 (×2): 3 [IU] via SUBCUTANEOUS
  Administered 2017-11-15: 2 [IU] via SUBCUTANEOUS
  Administered 2017-11-16: 3 [IU] via SUBCUTANEOUS
  Administered 2017-11-16: 1 [IU] via SUBCUTANEOUS
  Filled 2017-11-13 (×8): qty 1

## 2017-11-13 MED ORDER — METHYLPREDNISOLONE SODIUM SUCC 125 MG IJ SOLR
125.0000 mg | Freq: Once | INTRAMUSCULAR | Status: AC
  Administered 2017-11-13: 125 mg via INTRAVENOUS
  Filled 2017-11-13: qty 2

## 2017-11-13 MED ORDER — TIOTROPIUM BROMIDE MONOHYDRATE 18 MCG IN CAPS
18.0000 ug | ORAL_CAPSULE | Freq: Every day | RESPIRATORY_TRACT | Status: DC
  Administered 2017-11-14 – 2017-11-16 (×3): 18 ug via RESPIRATORY_TRACT
  Filled 2017-11-13: qty 5

## 2017-11-13 MED ORDER — ATORVASTATIN CALCIUM 20 MG PO TABS
20.0000 mg | ORAL_TABLET | Freq: Every day | ORAL | Status: DC
  Administered 2017-11-14 – 2017-11-15 (×2): 20 mg via ORAL
  Filled 2017-11-13 (×2): qty 1

## 2017-11-13 MED ORDER — MOMETASONE FURO-FORMOTEROL FUM 200-5 MCG/ACT IN AERO
2.0000 | INHALATION_SPRAY | Freq: Two times a day (BID) | RESPIRATORY_TRACT | Status: DC
  Administered 2017-11-13 – 2017-11-16 (×6): 2 via RESPIRATORY_TRACT
  Filled 2017-11-13: qty 8.8

## 2017-11-13 MED ORDER — SODIUM CHLORIDE 0.9 % IV BOLUS (SEPSIS)
1500.0000 mL | Freq: Once | INTRAVENOUS | Status: AC
  Administered 2017-11-13: 1500 mL via INTRAVENOUS

## 2017-11-13 MED ORDER — ONDANSETRON HCL 4 MG PO TABS: 4.0000 mg | ORAL_TABLET | Freq: Four times a day (QID) | ORAL | Status: DC | PRN

## 2017-11-13 MED ORDER — METOPROLOL TARTRATE 5 MG/5ML IV SOLN
5.0000 mg | Freq: Once | INTRAVENOUS | Status: AC
  Administered 2017-11-13: 5 mg via INTRAVENOUS
  Filled 2017-11-13: qty 5

## 2017-11-13 MED ORDER — IPRATROPIUM-ALBUTEROL 0.5-2.5 (3) MG/3ML IN SOLN
3.0000 mL | Freq: Four times a day (QID) | RESPIRATORY_TRACT | Status: DC
  Administered 2017-11-13 – 2017-11-14 (×2): 3 mL via RESPIRATORY_TRACT
  Filled 2017-11-13 (×2): qty 3

## 2017-11-13 MED ORDER — ACETAMINOPHEN 325 MG PO TABS: 650.0000 mg | ORAL_TABLET | Freq: Four times a day (QID) | ORAL | Status: DC | PRN

## 2017-11-13 MED ORDER — METOPROLOL SUCCINATE ER 100 MG PO TB24
200.0000 mg | ORAL_TABLET | Freq: Every day | ORAL | Status: DC
  Administered 2017-11-14 – 2017-11-16 (×3): 200 mg via ORAL
  Filled 2017-11-13 (×3): qty 2

## 2017-11-13 MED ORDER — ISOSORBIDE DINITRATE 30 MG PO TABS
30.0000 mg | ORAL_TABLET | Freq: Every day | ORAL | Status: DC
  Administered 2017-11-14 – 2017-11-16 (×3): 30 mg via ORAL
  Filled 2017-11-13 (×3): qty 1

## 2017-11-13 MED ORDER — ACETAMINOPHEN 650 MG RE SUPP: 650.0000 mg | Freq: Four times a day (QID) | RECTAL | Status: DC | PRN

## 2017-11-13 MED ORDER — AMLODIPINE BESYLATE 5 MG PO TABS
5.0000 mg | ORAL_TABLET | Freq: Every day | ORAL | Status: DC
  Administered 2017-11-14 – 2017-11-16 (×3): 5 mg via ORAL
  Filled 2017-11-13 (×3): qty 1

## 2017-11-13 MED ORDER — BISACODYL 10 MG RE SUPP: 10.0000 mg | Freq: Every day | RECTAL | Status: DC | PRN

## 2017-11-13 MED ORDER — GABAPENTIN 100 MG PO CAPS
100.0000 mg | ORAL_CAPSULE | Freq: Two times a day (BID) | ORAL | Status: DC
  Administered 2017-11-13 – 2017-11-16 (×6): 100 mg via ORAL
  Filled 2017-11-13 (×6): qty 1

## 2017-11-13 MED ORDER — METHYLPREDNISOLONE SODIUM SUCC 125 MG IJ SOLR
60.0000 mg | Freq: Four times a day (QID) | INTRAMUSCULAR | Status: DC
  Administered 2017-11-13 – 2017-11-15 (×7): 60 mg via INTRAVENOUS
  Filled 2017-11-13 (×7): qty 2

## 2017-11-13 MED ORDER — VANCOMYCIN HCL IN DEXTROSE 1-5 GM/200ML-% IV SOLN
1000.0000 mg | Freq: Once | INTRAVENOUS | Status: AC
  Administered 2017-11-13: 1000 mg via INTRAVENOUS
  Filled 2017-11-13: qty 200

## 2017-11-13 MED ORDER — PANTOPRAZOLE SODIUM 40 MG IV SOLR
40.0000 mg | Freq: Two times a day (BID) | INTRAVENOUS | Status: DC
  Administered 2017-11-13 – 2017-11-15 (×4): 40 mg via INTRAVENOUS
  Filled 2017-11-13 (×4): qty 40

## 2017-11-13 MED ORDER — DEXTROSE 5 % IV SOLN
500.0000 mg | INTRAVENOUS | Status: DC
  Administered 2017-11-13 – 2017-11-15 (×3): 500 mg via INTRAVENOUS
  Filled 2017-11-13 (×4): qty 500

## 2017-11-13 MED ORDER — ALBUTEROL SULFATE (2.5 MG/3ML) 0.083% IN NEBU: 3.0000 mL | INHALATION_SOLUTION | Freq: Four times a day (QID) | RESPIRATORY_TRACT | Status: DC | PRN

## 2017-11-13 MED ORDER — FLUTICASONE PROPIONATE 50 MCG/ACT NA SUSP
2.0000 | Freq: Every day | NASAL | Status: DC | PRN
  Filled 2017-11-13: qty 16

## 2017-11-13 NOTE — ED Provider Notes (Addendum)
[facility] Emergency Department Provider Note ____________________________________________   First MD Initiated Contact with Patient 11/13/17 1539     (approximate)  I have reviewed the triage vital signs and the nursing notes.   HISTORY  Chief Complaint Shortness of Breath    HPI Brett [name] is a 82 y.o. male with past medical history as noted below including COPD and recent admission for same, who presents with shortness of breath, acute onset over the last several hours, initially preceded by some left-sided abdominal discomfort and chest pain which is now resolved.  Patient denies associated cough or fever.  He states that he feels somewhat better after a nebulizer given by EMS.  Past Medical History:  Diagnosis Date  . Anemia   . Aortic regurgitation   . B-cell lymphoma ([place]) 2009   DX AT [facility]  . Bronchitis   . CAD (coronary artery disease)   . Carotid stenosis   . Diabetes mellitus, type 2 ([place])   . Emphysema of lung ([place])   . Essential hypertension   . History of chemotherapy   . Hyperlipidemia   . IDA (iron deficiency anemia)   . Leg edema   . Meralgia paraesthetica   . Mitral regurgitation   . PUD (peptic ulcer disease)   . PVD (peripheral vascular disease) ([place])   . Tobacco abuse     Patient Active Problem List   Diagnosis Date Noted  . Stroke ([place]) 12/02/2016  . Stroke (cerebrum) ([place]) 12/02/2016  . Leg weakness, bilateral 09/03/2016  . Chronic diastolic heart failure ([place]) 08/06/2016  . Pleural effusion 08/05/2016  . Sepsis ([place]) 07/10/2016  . HTN (hypertension) 07/10/2016  . Diabetes ([place]) 07/10/2016  . CAD (coronary artery disease) 07/10/2016  . COPD (chronic obstructive pulmonary disease) ([place]) 06/20/2016  . Chest pain 06/18/2016  . B-cell lymphoma ([place]) 10/14/2015    Past Surgical History:  Procedure Laterality Date  . CARDIAC CATHETERIZATION  08/15/2007  . CARDIAC CATHETERIZATION Right 07/13/2016   Procedure: Right/Left Heart Cath and Coronary Angiography;  Surgeon: Dionisio David, MD;  Location: [place] CV [facility];  Service: Cardiovascular;  Laterality: Right;  . COLONOSCOPY  03/2013  . ESOPHAGOGASTRODUODENOSCOPY  03/2013    Prior to Admission medications   Medication Sig Start Date End Date Taking? Authorizing Provider  albuterol (PROVENTIL HFA;VENTOLIN HFA) 108 (90 Base) MCG/ACT inhaler Inhale 2 puffs into the lungs every 6 (six) hours as needed for wheezing or shortness of breath. 09/24/17  Yes Wieting, Richard, MD  albuterol (PROVENTIL) (2.5 MG/3ML) 0.083% nebulizer solution Take 3 mLs (2.5 mg total) by nebulization every 6 (six) hours as needed for wheezing or shortness of breath. 09/24/17  Yes Wieting, Richard, MD  amLODipine (NORVASC) 5 MG tablet Take 5 mg by mouth daily.  08/27/15  Yes [provider]  atorvastatin (LIPITOR) 20 MG tablet Take 1 tablet (20 mg total) by mouth daily at 6 PM. 09/24/17  Yes Wieting, Richard, MD  clopidogrel (PLAVIX) 75 MG tablet Take 75 mg by mouth daily.  08/27/15  Yes [provider]  fluticasone (FLONASE) 50 MCG/ACT nasal spray Place 2 sprays into the nose daily as needed for allergies.   Yes [provider]  furosemide (LASIX) 40 MG tablet Take 1 tablet (40 mg total) by mouth daily. 09/24/17  Yes Wieting, Richard, MD  gabapentin (NEURONTIN) 100 MG capsule Take 1 capsule by mouth 2 (two) times daily.  09/22/15  Yes [provider]  glimepiride (AMARYL) 1 MG tablet Take  1 mg by mouth 2 (two) times daily.   Yes [provider]  hydrALAZINE (APRESOLINE) 25 MG tablet Take 2 tablets (50 mg total) by mouth 3 (three) times daily. Patient taking differently: Take 50 mg by mouth 2 (two) times daily.  06/21/16  Yes Gouru, Illene Silver, MD  isosorbide dinitrate (ISORDIL) 30 MG tablet Take 30 mg by mouth daily.   Yes [provider]  metoprolol (TOPROL-XL) 200 MG 24 hr tablet Take 1 tablet (200 mg total) by mouth daily.  09/24/17  Yes Wieting, Richard, MD  pantoprazole (PROTONIX) 40 MG tablet Take 40 mg by mouth daily.   Yes [provider]  eplerenone (INSPRA) 25 MG tablet Take 50 mg by mouth daily.    [provider]  Fluticasone-Salmeterol (ADVAIR DISKUS) 250-50 MCG/DOSE AEPB Inhale 1 puff into the lungs 2 times daily at 12 noon and 4 pm. Patient not taking: Reported on 11/13/2017 06/21/16   [name], MD  nicotine (NICODERM CQ - DOSED IN MG/24 HR) 7 mg/24hr patch Place 1 patch (7 mg total) onto the skin daily. Patient not taking: Reported on 11/13/2017 09/24/17   Loletha Grayer, MD  predniSONE (DELTASONE) 10 MG tablet 4 tablets po daily for two days Patient not taking: Reported on 11/13/2017 09/24/17   Loletha Grayer, MD  senna-docusate (SENOKOT-S) 8.6-50 MG tablet Take 1 tablet by mouth 2 (two) times daily. Patient not taking: Reported on 11/13/2017 06/21/16   [name], MD  tiotropium (SPIRIVA HANDIHALER) 18 MCG inhalation capsule Place 1 capsule (18 mcg total) into inhaler and inhale daily. Patient not taking: Reported on 11/13/2017 09/24/17   Loletha Grayer, MD    Allergies Patient has no known allergies.  Family History  Problem Relation Age of Onset  . Rheum arthritis Neg Hx   . Osteoarthritis Neg Hx   . Asthma Neg Hx   . Diabetes Neg Hx   . Cancer Neg Hx     Social History Social History   Tobacco Use  . Smoking status: Former Smoker    Types: Cigarettes    Last attempt to quit: 08/30/2015    Years since quitting: 2.2  . Smokeless tobacco: Never Used  Substance Use Topics  . Alcohol use: No    Alcohol/week: 0.0 oz  . Drug use: No    Review of Systems  Constitutional: No fever. Eyes: No redness. ENT: No sore throat. Cardiovascular: Positive for resolved chest pain. Respiratory: Positive for shortness of breath. Gastrointestinal: No nausea, no vomiting.  No diarrhea.  Genitourinary: Negative for dysuria.  Musculoskeletal: Negative for back  pain. Skin: Negative for rash. Neurological: Positive for headache.   ____________________________________________   PHYSICAL EXAM:  VITAL SIGNS: ED Triage Vitals  Enc Vitals Group     BP 11/13/17 1536 (!) 124/57     Pulse Rate 11/13/17 1536 (!) 152     Resp 11/13/17 1536 (!) 33     Temp 11/13/17 1536 98.1 F (36.7 C)     Temp Source 11/13/17 1536 Oral     SpO2 11/13/17 1536 (!) 89 %     Weight 11/13/17 1538 189 lb (85.7 kg)     Height 11/13/17 1538 5\' 10"  (1.778 m)     Head Circumference --      Peak Flow --      Pain Score --      Pain Loc --      Pain Edu? --      Excl. in [place]? --     Constitutional:  Alert and oriented.  Slightly uncomfortable appearing, but no acute distress. Eyes: Conjunctivae are normal.  Head: Atraumatic. Nose: No congestion/rhinnorhea. Mouth/Throat: Mucous membranes are dry.   Neck: Normal range of motion.  Cardiovascular: Tachycardic, regular rhythm. Grossly normal heart sounds.  Good peripheral circulation. Respiratory: Slightly increased respiratory effort.  Decreased breath sounds bilaterally with scattered rhonchi. Gastrointestinal: Soft with mild left upper quadrant discomfort. No distention.  Genitourinary: No CVA tenderness. Musculoskeletal: No lower extremity edema.  Extremities warm and well perfused.  Neurologic:  Normal speech and language.  Motor intact in all extremities. no gross focal neurologic deficits are appreciated.  Skin:  Skin is warm and dry. No rash noted. Psychiatric: Mood and affect are normal. Speech and behavior are normal.  ____________________________________________   LABS (all labs ordered are listed, but only abnormal results are displayed)  Labs Reviewed  COMPREHENSIVE METABOLIC PANEL - Abnormal; Notable for the following components:      Result Value   Glucose, Bld 160 (*)    BUN 22 (*)    Creatinine, Ser 1.64 (*)    Calcium 8.2 (*)    ALT 15 (*)    GFR calc non Af Amer 38 (*)    GFR calc Af Amer 44  (*)    All other components within normal limits  CBC WITH DIFFERENTIAL/PLATELET - Abnormal; Notable for the following components:   WBC 19.3 (*)    RBC 4.20 (*)    Hemoglobin 11.5 (*)    HCT 35.3 (*)    RDW 17.1 (*)    Neutro Abs 16.8 (*)    Lymphs Abs 0.8 (*)    Monocytes Absolute 1.5 (*)    All other components within normal limits  TROPONIN I - Abnormal; Notable for the following components:   Troponin I 0.04 (*)    All other components within normal limits  CULTURE, BLOOD (ROUTINE X 2)  CULTURE, BLOOD (ROUTINE X 2)  BRAIN NATRIURETIC PEPTIDE  URINALYSIS, COMPLETE (UACMP) WITH MICROSCOPIC  LACTIC ACID, PLASMA  LACTIC ACID, PLASMA   ____________________________________________  EKG  ED ECG REPORT I, Arta Silence, the attending physician, personally viewed and interpreted this ECG.  Date: 11/13/2017 EKG Time: 1529 Rate: 147 Rhythm: Sinus tachycardia versus SVT QRS Axis: normal Intervals: normal ST/T Wave abnormalities: Nonspecific abnormalities lateral leads Narrative Interpretation: Likely sinus tachycardia with no other acute findings, otherwise no change when compared to EKG of 09/17/2017  ED ECG REPORT I, Arta Silence, the attending physician, personally viewed and interpreted this ECG.  Date: 11/13/2017 EKG Time: 1627 Rate: 108 Rhythm: Sinus tachycardia QRS Axis: normal Intervals: normal ST/T Wave abnormalities: normal Narrative Interpretation: no evidence of acute ischemia; no significant change other than rate from EKG of earlier today  ____________________________________________  RADIOLOGY  CXR: L lower lobe pneumonia  ____________________________________________   PROCEDURES  Procedure(s) performed: No    Critical Care performed: No ____________________________________________   INITIAL IMPRESSION / ASSESSMENT AND PLAN / ED COURSE  Pertinent labs & imaging results that were available during my care of the patient were  reviewed by me and considered in my medical decision making (see chart for details).  82 year old male with past medical history as noted above presents with relatively acute onset of increased shortness of breath associated with some now resolved chest pain and left upper quadrant abdominal pain.  Patient is normally on O2 at home, but is now requiring 6 L by nasal cannula to maintain O2 sat in the low 90s.  On exam, patient is  tachycardic to the 140s, tachypneic, and hypoxic, but other vital signs are normal.  He is slightly uncomfortable appearing but not in acute respiratory distress.  The remainder the exam is as described with decreased breath sounds bilaterally.  Review of past medical records in Epic reveals an admission for COPD exacerbation 2 months ago after which patient was placed on O2.  Differential for today's presentation is COPD exacerbation, acute bronchitis, pneumonia, less likely cardiac etiology.  I suspect that the elevated HR is sinus tachycardia due to dehydration and exertion, rather than SVT or atrial fibrillation, however since it is very elevated I will give both fluids and low dose of metoprolol for rate control.  Plan: Nebs, steroids, chest x-ray, labs, IV fluids, IV metoprolol, and reassess.    ----------------------------------------- 6:38 PM on 11/13/2017 -----------------------------------------  Chest x-ray reveals likely left lower lobe infiltrate, and patient's white blood cell count is 19 so presentation is consistent with pneumonia.  Given the patient was admitted less than 2 months ago I will treat for healthcare associated pneumonia.  Patient appears more comfortable but remains somewhat hypoxic, and on 3 L by nasal cannula his O2 sat is only around 90-92%, the patient will need to be admitted.  Patient will be signed out to the hospitalist Dr. Doy Hutching.  ____________________________________________   FINAL CLINICAL IMPRESSION(S) / ED DIAGNOSES  Final  diagnoses:  HCAP (healthcare-associated pneumonia)      NEW MEDICATIONS STARTED DURING THIS VISIT:  This SmartLink is deprecated. Use AVSMEDLIST instead to display the medication list for a patient.   Note:  This document was prepared using Dragon voice recognition software and may include unintentional dictation errors.    Arta Silence, MD 11/13/17 1840    Arta Silence, MD 11/13/17 [phone]

## 2017-11-13 NOTE — ED Notes (Signed)
Pt transport to floor by Kirke Shaggy, RN  Pt. Verbalizes understanding of admission. VS stable and pain controlled per pt.  Pt. In NAD at time of transfer. Pt. Stable at the time of departure from the unit, departing unit by the safest and most appropriate manner per that pt condition and limitations with all belongings accounted for.

## 2017-11-13 NOTE — ED Notes (Signed)
Admitting MD at bedside.

## 2017-11-13 NOTE — H&P (Signed)
History and Physical    Brett [name] ATF:[id number] DOB: 11/26/1935 DOA: 11/13/2017  Referring physician: Dr. Cherylann Banas PCP: Perrin Maltese, MD  Specialists: none  Chief Complaint: SOB  HPI: Brett [name] is a 82 y.o. male has a past medical history significant for COPD and Dm as well as Lymphoma now with SOB and fever. In ER, pt was hypoxic with leukocytosis and elevated lactic acid. Also tachycardic. He is now admitted. Denies CP or cough.No N/V/D. CXR shows LLL pneumonia.   Review of Systems: The patient denies anorexia,  weight loss,, vision loss, decreased hearing, hoarseness, chest pain, syncope, peripheral edema, balance deficits, hemoptysis, abdominal pain, melena, hematochezia, severe indigestion/heartburn, hematuria, incontinence, genital sores, muscle weakness, suspicious skin lesions, transient blindness, difficulty walking, depression, unusual weight change, abnormal bleeding, enlarged lymph nodes, angioedema, and breast masses.   Past Medical History:  Diagnosis Date  . Anemia   . Aortic regurgitation   . B-cell lymphoma ([place]) 2009   DX AT [facility]  . Bronchitis   . CAD (coronary artery disease)   . Carotid stenosis   . Diabetes mellitus, type 2 ([place])   . Emphysema of lung ([place])   . Essential hypertension   . History of chemotherapy   . Hyperlipidemia   . IDA (iron deficiency anemia)   . Leg edema   . Meralgia paraesthetica   . Mitral regurgitation   . PUD (peptic ulcer disease)   . PVD (peripheral vascular disease) ([place])   . Tobacco abuse    Past Surgical History:  Procedure Laterality Date  . CARDIAC CATHETERIZATION  08/15/2007  . CARDIAC CATHETERIZATION Right 07/13/2016   Procedure: Right/Left Heart Cath and Coronary Angiography;  Surgeon: Dionisio David, MD;  Location: [place] [facility];  Service: Cardiovascular;  Laterality: Right;  . COLONOSCOPY  03/2013  . ESOPHAGOGASTRODUODENOSCOPY  03/2013   Social History:  reports that he quit smoking about 2  years ago. His smoking use included cigarettes. he has never used smokeless tobacco. He reports that he does not drink alcohol or use drugs.  No Known Allergies  Family History  Problem Relation Age of Onset  . Rheum arthritis Neg Hx   . Osteoarthritis Neg Hx   . Asthma Neg Hx   . Diabetes Neg Hx   . Cancer Neg Hx     Prior to Admission medications   Medication Sig Start Date End Date Taking? Authorizing Provider  albuterol (PROVENTIL HFA;VENTOLIN HFA) 108 (90 Base) MCG/ACT inhaler Inhale 2 puffs into the lungs every 6 (six) hours as needed for wheezing or shortness of breath. 09/24/17  Yes Wieting, Richard, MD  albuterol (PROVENTIL) (2.5 MG/3ML) 0.083% nebulizer solution Take 3 mLs (2.5 mg total) by nebulization every 6 (six) hours as needed for wheezing or shortness of breath. 09/24/17  Yes Wieting, Richard, MD  amLODipine (NORVASC) 5 MG tablet Take 5 mg by mouth daily.  08/27/15  Yes [provider]  atorvastatin (LIPITOR) 20 MG tablet Take 1 tablet (20 mg total) by mouth daily at 6 PM. 09/24/17  Yes Wieting, Richard, MD  clopidogrel (PLAVIX) 75 MG tablet Take 75 mg by mouth daily.  08/27/15  Yes [provider]  fluticasone (FLONASE) 50 MCG/ACT nasal spray Place 2 sprays into the nose daily as needed for allergies.   Yes [provider]  furosemide (LASIX) 40 MG tablet Take 1 tablet (40 mg total) by mouth daily. 09/24/17  Yes Wieting, Richard, MD  gabapentin (NEURONTIN) 100 MG capsule Take 1  capsule by mouth 2 (two) times daily.  09/22/15  Yes [provider]  glimepiride (AMARYL) 1 MG tablet Take 1 mg by mouth 2 (two) times daily.   Yes [provider]  hydrALAZINE (APRESOLINE) 25 MG tablet Take 2 tablets (50 mg total) by mouth 3 (three) times daily. Patient taking differently: Take 50 mg by mouth 2 (two) times daily.  06/21/16  Yes Gouru, Illene Silver, MD  isosorbide dinitrate (ISORDIL) 30 MG tablet Take 30 mg by mouth daily.   Yes [provider]  metoprolol (TOPROL-XL) 200 MG 24 hr tablet Take 1 tablet (200 mg total) by mouth daily. 09/24/17  Yes Wieting, Richard, MD  pantoprazole (PROTONIX) 40 MG tablet Take 40 mg by mouth daily.   Yes [provider]  eplerenone (INSPRA) 25 MG tablet Take 50 mg by mouth daily.    [provider]  Fluticasone-Salmeterol (ADVAIR DISKUS) 250-50 MCG/DOSE AEPB Inhale 1 puff into the lungs 2 times daily at 12 noon and 4 pm. Patient not taking: Reported on 11/13/2017 06/21/16   [name], MD  nicotine (NICODERM CQ - DOSED IN MG/24 HR) 7 mg/24hr patch Place 1 patch (7 mg total) onto the skin daily. Patient not taking: Reported on 11/13/2017 09/24/17   Loletha Grayer, MD  predniSONE (DELTASONE) 10 MG tablet 4 tablets po daily for two days Patient not taking: Reported on 11/13/2017 09/24/17   Loletha Grayer, MD  senna-docusate (SENOKOT-S) 8.6-50 MG tablet Take 1 tablet by mouth 2 (two) times daily. Patient not taking: Reported on 11/13/2017 06/21/16   [name], MD  tiotropium (SPIRIVA HANDIHALER) 18 MCG inhalation capsule Place 1 capsule (18 mcg total) into inhaler and inhale daily. Patient not taking: Reported on 11/13/2017 09/24/17   Loletha Grayer, MD   Physical Exam: Vitals:   11/13/17 1538 11/13/17 1625 11/13/17 1748 11/13/17 1900  BP:  129/68 138/65 132/65  Pulse:  (!) 111 (!) 118 (!) 116  Resp:  20 20 (!) 22  Temp:      TempSrc:      SpO2:   93% 93%  Weight: 85.7 kg (189 lb)     Height: 5\' 10"  (1.778 m)        General:  No apparent distress, WDWN, [place]/AT  Eyes: PERRL, EOMI, no scleral icterus, conjunctiva clear  ENT: moist oropharynx without exudate, TM's benign, dentition fair  Neck: supple, no lymphadenopathy. No bruits or thyromegaly  Cardiovascular: rapid rate with regular rhythm without MRG; 2+ peripheral pulses, no JVD, no peripheral edema  Respiratory: scattered rhonchi and wheezes with dullness at left base. No rales. Respiratory effort  is increased  Abdomen: soft, non tender to palpation, positive bowel sounds, no guarding, no rebound  Skin: no rashes or lesions  Musculoskeletal: normal bulk and tone, no joint swelling  Psychiatric: normal mood and affect, A&OX3  Neurologic: CN 2-12 grossly intact, Motor strength 5/5 in all 4 groups with symmetric DTR's and non-focal sensory exam  Labs on Admission:  Basic Metabolic Panel: Recent Labs  Lab 11/13/17 1608  NA 140  K 4.2  CL 102  CO2 27  GLUCOSE 160*  BUN 22*  CREATININE 1.64*  CALCIUM 8.2*   Liver Function Tests: Recent Labs  Lab 11/13/17 1608  AST 23  ALT 15*  ALKPHOS 78  BILITOT 0.8  PROT 6.6  ALBUMIN 3.5   No results for input(s): LIPASE, AMYLASE in the last 168 hours. No results for input(s): AMMONIA in the last 168 hours. CBC: Recent Labs  Lab  11/13/17 1608  WBC 19.3*  NEUTROABS 16.8*  HGB 11.5*  HCT 35.3*  MCV 84.2  PLT 217   Cardiac Enzymes: Recent Labs  Lab 11/13/17 1608  TROPONINI 0.04*    BNP (last 3 results) Recent Labs    11/13/17 1608  BNP 91.0    ProBNP (last 3 results) No results for input(s): PROBNP in the last 8760 hours.  CBG: No results for input(s): GLUCAP in the last 168 hours.  Radiological Exams on Admission: Dg Chest Portable 1 View  Result Date: 11/13/2017 CLINICAL DATA:  B-cell lymphoma. Shortness of breath. Rule out pneumonia. EXAM: PORTABLE CHEST 1 VIEW COMPARISON:  09/21/2017 FINDINGS: Cardiomegaly. Consolidation in the left lower lobe concerning for pneumonia. Small left effusion. No confluent opacity or effusion on the right. No acute bony abnormality. IMPRESSION: Left lower lobe opacity concerning for pneumonia. Small left effusion. Electronically Signed   By: Rolm Baptise M.D.   On: 11/13/2017 16:06    EKG: Independently reviewed.  Assessment/Plan Principal Problem:   Acute respiratory failure (HCC) Active Problems:   Diabetes ([place])   CAP (community acquired pneumonia)   SIRS  (systemic inflammatory response syndrome) ([place])   Will admit to floor with IV fluids, IV ABX, SVN's, O2, and IV steroids. Follow sugars. Repeat labs in AM. F/u cultures for ? Of sepsis  Diet: clear liquids Fluids: NS@75  DVT Prophylaxis: SQ Heparin  Code Status: FULL  Family Communication: no  Disposition Plan: home  Time spent: 50 min

## 2017-11-13 NOTE — ED Notes (Signed)
Pt neighbor in room with patient. No concerns or complaints voiced. VSS.

## 2017-11-13 NOTE — ED Triage Notes (Signed)
Pt to ER from home via [facility] they called out for SOB. EMS gave pt Duo neb. Heart rate is in the 140's to 150's. Pt is 89% on room air.

## 2017-11-14 ENCOUNTER — Other Ambulatory Visit: Payer: Self-pay

## 2017-11-14 LAB — CBC
HCT: 30 % — ABNORMAL LOW (ref 40.0–52.0)
HEMOGLOBIN: 9.6 g/dL — AB (ref 13.0–18.0)
MCH: 27.4 pg (ref 26.0–34.0)
MCHC: 32 g/dL (ref 32.0–36.0)
MCV: 85.7 fL (ref 80.0–100.0)
Platelets: 181 10*3/uL (ref 150–440)
RBC: 3.5 MIL/uL — AB (ref 4.40–5.90)
RDW: 17.3 % — ABNORMAL HIGH (ref 11.5–14.5)
WBC: 15.9 10*3/uL — ABNORMAL HIGH (ref 3.8–10.6)

## 2017-11-14 LAB — GLUCOSE, CAPILLARY
GLUCOSE-CAPILLARY: 271 mg/dL — AB (ref 65–99)
Glucose-Capillary: 264 mg/dL — ABNORMAL HIGH (ref 65–99)
Glucose-Capillary: 212 mg/dL — ABNORMAL HIGH (ref 65–99)
Glucose-Capillary: 244 mg/dL — ABNORMAL HIGH (ref 65–99)

## 2017-11-14 LAB — COMPREHENSIVE METABOLIC PANEL
ALT: 14 U/L — ABNORMAL LOW (ref 17–63)
ANION GAP: 12 (ref 5–15)
AST: 38 U/L (ref 15–41)
Albumin: 2.9 g/dL — ABNORMAL LOW (ref 3.5–5.0)
Alkaline Phosphatase: 56 U/L (ref 38–126)
BUN: 26 mg/dL — ABNORMAL HIGH (ref 6–20)
CHLORIDE: 108 mmol/L (ref 101–111)
CO2: 20 mmol/L — AB (ref 22–32)
Calcium: 7.1 mg/dL — ABNORMAL LOW (ref 8.9–10.3)
Creatinine, Ser: 1.81 mg/dL — ABNORMAL HIGH (ref 0.61–1.24)
GFR calc non Af Amer: 34 mL/min — ABNORMAL LOW (ref >60)
GFR, EST AFRICAN AMERICAN: 39 mL/min — AB (ref >60)
Glucose, Bld: 345 mg/dL — ABNORMAL HIGH (ref 65–99)
POTASSIUM: 4.1 mmol/L (ref 3.5–5.1)
SODIUM: 140 mmol/L (ref 135–145)
Total Bilirubin: 0.5 mg/dL (ref 0.3–1.2)
Total Protein: 5.7 g/dL — ABNORMAL LOW (ref 6.5–8.1)

## 2017-11-14 LAB — LACTIC ACID, PLASMA: LACTIC ACID, VENOUS: 5.9 mmol/L — AB (ref 0.5–1.9)

## 2017-11-14 MED ORDER — ALBUTEROL SULFATE (2.5 MG/3ML) 0.083% IN NEBU
3.0000 mL | INHALATION_SOLUTION | Freq: Four times a day (QID) | RESPIRATORY_TRACT | Status: DC
  Administered 2017-11-14 – 2017-11-16 (×6): 3 mL via RESPIRATORY_TRACT
  Filled 2017-11-14 (×7): qty 3

## 2017-11-14 MED ORDER — SODIUM CHLORIDE 0.9% FLUSH
3.0000 mL | Freq: Two times a day (BID) | INTRAVENOUS | Status: DC
  Administered 2017-11-14 – 2017-11-16 (×4): 3 mL via INTRAVENOUS

## 2017-11-14 MED ORDER — CHLORHEXIDINE GLUCONATE 0.12 % MT SOLN
15.0000 mL | Freq: Two times a day (BID) | OROMUCOSAL | Status: DC
  Administered 2017-11-14 – 2017-11-16 (×5): 15 mL via OROMUCOSAL
  Filled 2017-11-14 (×5): qty 15

## 2017-11-14 MED ORDER — DEXTROSE 5 % IV SOLN
1.0000 g | Freq: Every day | INTRAVENOUS | Status: DC
  Administered 2017-11-14 – 2017-11-16 (×3): 1 g via INTRAVENOUS
  Filled 2017-11-14 (×4): qty 10

## 2017-11-14 MED ORDER — ORAL CARE MOUTH RINSE
15.0000 mL | Freq: Two times a day (BID) | OROMUCOSAL | Status: DC
  Administered 2017-11-14 – 2017-11-15 (×2): 15 mL via OROMUCOSAL

## 2017-11-14 MED ORDER — IPRATROPIUM-ALBUTEROL 0.5-2.5 (3) MG/3ML IN SOLN
3.0000 mL | Freq: Three times a day (TID) | RESPIRATORY_TRACT | Status: DC
  Administered 2017-11-14: 3 mL via RESPIRATORY_TRACT
  Filled 2017-11-14: qty 3

## 2017-11-14 NOTE — Progress Notes (Signed)
Inpatient Diabetes Program Recommendations  AACE/ADA: New Consensus Statement on Inpatient Glycemic Control (2015)  Target Ranges:  Prepandial:   less than 140 mg/dL      Peak postprandial:   less than 180 mg/dL (1-2 hours)      Critically ill patients:  140 - 180 mg/dL   Lab Results  Component Value Date   GLUCAP 264 (H) 11/14/2017   HGBA1C 6.0 (H) 09/22/2017   Review of Glycemic Control  Diabetes history: DM 2 Outpatient Diabetes medications: Amaryl 1 mg BID Current orders for Inpatient glycemic control: Amaryl 1 mg BID, Novolog Sensitive Correction 0-9 units tid  Inpatient Diabetes Program Recommendations:    A1c 6% on 09/22/17 Patient on IV steroids.  Patient on clear liquid diet. Glimepiride is high risk for hypoglycemia in the acute care setting. Consider d/cing Glimepiride while inpatient.   If patient remains on Glimepiride consider Current Correction scale but Q4 hours.  Consider increasing dose and frequency of Novolog Correction scale to Novolog Moderate Correction 0-15 units Q4 hours.  Thanks,  Tama Headings RN, MSN, [facility] Inpatient Diabetes Coordinator Team Pager [phone] (8a-5p)

## 2017-11-14 NOTE — Plan of Care (Signed)
  Clinical Measurements: Diagnostic test results will improve 11/14/2017 0350 - Not Progressing by Marylouise Stacks, RN  Lactic acid at 5.9. Clinical Measurements: Respiratory complications will improve 11/14/2017 0350 - Not Progressing by Marylouise Stacks, RN   Activity: Risk for activity intolerance will decrease 11/14/2017 0350 - Not Progressing by Marylouise Stacks, RN  Dyspnea with exertion.

## 2017-11-14 NOTE — Care Management (Signed)
Patient admitted from home with acute respiratory failure. PCP [name].  PT consult pending.  Previous admission patient was set up with continuous home O2 through [place].  RNCM following.

## 2017-11-14 NOTE — Progress Notes (Signed)
[place] [facility]                                                                                                                                                                                  Patient Demographics   Brett [name], is a 82 y.o. male, DOB - [birth year], KGU:[id number]  Admit date - 11/13/2017   Admitting Physician Idelle Crouch, MD  Outpatient Primary MD for the patient is Perrin Maltese, MD   LOS - 1  Subjective: Patient continues to have cough and shortness of breath    Review of Systems:   CONSTITUTIONAL: No documented fever. No fatigue, weakness. No weight gain, no weight loss.  EYES: No blurry or double vision.  ENT: No tinnitus. No postnasal drip. No redness of the oropharynx.  RESPIRATORY: Positive cough, no wheeze, no hemoptysis.  Positive dyspnea.  CARDIOVASCULAR: No chest pain. No orthopnea. No palpitations. No syncope.  GASTROINTESTINAL: No nausea, no vomiting or diarrhea. No abdominal pain. No melena or hematochezia.  GENITOURINARY: No dysuria or hematuria.  ENDOCRINE: No polyuria or nocturia. No heat or cold intolerance.  HEMATOLOGY: No anemia. No bruising. No bleeding.  INTEGUMENTARY: No rashes. No lesions.  MUSCULOSKELETAL: No arthritis. No swelling. No gout.  NEUROLOGIC: No numbness, tingling, or ataxia. No seizure-type activity.  PSYCHIATRIC: No anxiety. No insomnia. No ADD.    Vitals:   Vitals:   11/14/17 0352 11/14/17 0744 11/14/17 1708 11/14/17 1709  BP: (!) 126/55 137/67 112/62   Pulse: (!) 107 (!) 110 (!) 102 (!) 104  Resp: 18 18 18    Temp: 97.7 F (36.5 C)   98.1 F (36.7 C)  TempSrc: Oral   Oral  SpO2: 92% 96% 92% 92%  Weight: 192 lb 9.6 oz (87.4 kg)     Height:        Wt Readings from Last 3 Encounters:  11/14/17 192 lb 9.6 oz (87.4 kg)  09/21/17 182 lb 6.4 oz (82.7 kg)  12/08/16 173 lb (78.5 kg)     Intake/Output Summary (Last 24 hours) at 11/14/2017 1837 Last data filed at  11/14/2017 1829 Gross per 24 hour  Intake 2602.5 ml  Output 1720 ml  Net 882.5 ml    Physical Exam:   GENERAL: Pleasant-appearing in no apparent distress.  HEAD, EYES, EARS, NOSE AND THROAT: Atraumatic, normocephalic. Extraocular muscles are intact. Pupils equal and reactive to light. Sclerae anicteric. No conjunctival injection. No oro-pharyngeal erythema.  NECK: Supple. There is no jugular venous distention. No bruits, no lymphadenopathy, no thyromegaly.  HEART: Regular rate and rhythm,. No murmurs, no rubs, no clicks.  LUNGS: Rhonchus breath sounds ABDOMEN: Soft, flat, nontender, nondistended. Has  good bowel sounds. No hepatosplenomegaly appreciated.  EXTREMITIES: No evidence of any cyanosis, clubbing, or peripheral edema.  +2 pedal and radial pulses bilaterally.  NEUROLOGIC: The patient is alert, awake, and oriented x3 with no focal motor or sensory deficits appreciated bilaterally.  SKIN: Moist and warm with no rashes appreciated.  Psych: Not anxious, depressed LN: No inguinal LN enlargement    Antibiotics   Anti-infectives (From admission, onward)   Start     Dose/Rate Route Frequency Ordered Stop   11/14/17 1100  cefTRIAXone (ROCEPHIN) 1 g in dextrose 5 % 50 mL IVPB     1 g 100 mL/hr over 30 Minutes Intravenous Daily 11/14/17 1012     11/13/17 2100  azithromycin (ZITHROMAX) 500 mg in dextrose 5 % 250 mL IVPB     500 mg 250 mL/hr over 60 Minutes Intravenous Every 24 hours 11/13/17 1925     11/13/17 1815  ceFEPIme (MAXIPIME) 2 g in dextrose 5 % 50 mL IVPB     2 g 100 mL/hr over 30 Minutes Intravenous  Once 11/13/17 1811 11/13/17 1901   11/13/17 1815  vancomycin (VANCOCIN) IVPB 1000 mg/200 mL premix     1,000 mg 200 mL/hr over 60 Minutes Intravenous  Once 11/13/17 1811 11/13/17 2041      Medications   Scheduled Meds: . amLODipine  5 mg Oral Daily  . atorvastatin  20 mg Oral q1800  . chlorhexidine  15 mL Mouth Rinse BID  . clopidogrel  75 mg Oral Daily  . docusate  sodium  100 mg Oral BID  . furosemide  40 mg Oral Daily  . gabapentin  100 mg Oral BID  . glimepiride  1 mg Oral BID  . heparin  5,000 Units Subcutaneous Q8H  . hydrALAZINE  50 mg Oral BID  . insulin aspart  0-9 Units Subcutaneous TID WC  . ipratropium-albuterol  3 mL Nebulization TID  . isosorbide dinitrate  30 mg Oral Daily  . mouth rinse  15 mL Mouth Rinse q12n4p  . methylPREDNISolone (SOLU-MEDROL) injection  60 mg Intravenous Q6H  . metoprolol  200 mg Oral Daily  . mometasone-formoterol  2 puff Inhalation BID  . pantoprazole (PROTONIX) IV  40 mg Intravenous Q12H  . sodium chloride flush  3 mL Intravenous Q12H  . tiotropium  18 mcg Inhalation Daily   Continuous Infusions: . sodium chloride 75 mL/hr at 11/14/17 0651  . azithromycin Stopped (11/13/17 2325)  . cefTRIAXone (ROCEPHIN)  IV Stopped (11/14/17 1334)   PRN Meds:.acetaminophen **OR** acetaminophen, albuterol, bisacodyl, fluticasone, ondansetron **OR** ondansetron (ZOFRAN) IV   Data Review:   Micro Results Recent Results (from the past 240 hour(s))  Culture, blood (routine x 2)     Status: None (Preliminary result)   Collection Time: 11/13/17  6:15 PM  Result Value Ref Range Status   Specimen Description BLOOD RT HAND  Final   Special Requests   Final    BOTTLES DRAWN AEROBIC AND ANAEROBIC Blood Culture results may not be optimal due to an excessive volume of blood received in culture bottles   Culture NO GROWTH < 24 HOURS  Final   Report Status PENDING  Incomplete  Culture, blood (routine x 2)     Status: None (Preliminary result)   Collection Time: 11/13/17  6:15 PM  Result Value Ref Range Status   Specimen Description BLOOD LT [facility]  Final   Special Requests   Final    BOTTLES DRAWN AEROBIC AND ANAEROBIC Blood Culture adequate volume  Culture NO GROWTH < 24 HOURS  Final   Report Status PENDING  Incomplete    Radiology Reports Dg Chest Portable 1 View  Result Date: 11/13/2017 CLINICAL DATA:  B-cell lymphoma.  Shortness of breath. Rule out pneumonia. EXAM: PORTABLE CHEST 1 VIEW COMPARISON:  09/21/2017 FINDINGS: Cardiomegaly. Consolidation in the left lower lobe concerning for pneumonia. Small left effusion. No confluent opacity or effusion on the right. No acute bony abnormality. IMPRESSION: Left lower lobe opacity concerning for pneumonia. Small left effusion. Electronically Signed   By: Rolm Baptise M.D.   On: 11/13/2017 16:06     CBC Recent Labs  Lab 11/13/17 1608 11/14/17 0043  WBC 19.3* 15.9*  HGB 11.5* 9.6*  HCT 35.3* 30.0*  PLT 217 181  MCV 84.2 85.7  MCH 27.3 27.4  MCHC 32.5 32.0  RDW 17.1* 17.3*  LYMPHSABS 0.8*  --   MONOABS 1.5*  --   EOSABS 0.0  --   BASOSABS 0.1  --     Chemistries  Recent Labs  Lab 11/13/17 1608 11/14/17 0043  NA 140 140  K 4.2 4.1  CL 102 108  CO2 27 20*  GLUCOSE 160* 345*  BUN 22* 26*  CREATININE 1.64* 1.81*  CALCIUM 8.2* 7.1*  AST 23 38  ALT 15* 14*  ALKPHOS 78 56  BILITOT 0.8 0.5   ------------------------------------------------------------------------------------------------------------------ estimated creatinine clearance is 33.6 mL/min (A) (by C-G formula based on SCr of 1.81 mg/dL (H)). ------------------------------------------------------------------------------------------------------------------ No results for input(s): HGBA1C in the last 72 hours. ------------------------------------------------------------------------------------------------------------------ No results for input(s): CHOL, HDL, LDLCALC, TRIG, CHOLHDL, LDLDIRECT in the last 72 hours. ------------------------------------------------------------------------------------------------------------------ No results for input(s): TSH, T4TOTAL, T3FREE, THYROIDAB in the last 72 hours.  Invalid input(s): FREET3 ------------------------------------------------------------------------------------------------------------------ No results for input(s): VITAMINB12, FOLATE,  FERRITIN, TIBC, IRON, RETICCTPCT in the last 72 hours.  Coagulation profile No results for input(s): INR, PROTIME in the last 168 hours.  No results for input(s): DDIMER in the last 72 hours.  Cardiac Enzymes Recent Labs  Lab 11/13/17 1608  TROPONINI 0.04*   ------------------------------------------------------------------------------------------------------------------ Invalid input(s): POCBNP    Assessment & Plan   Patient is a 82 year old with shortness of breath  1.  Community-acquired pneumonia patient was only on azithromycin I will add ceftriaxone  2.  Acute on chronic COPD exasperation continue therapy with nebulizer steroids and home inhalers  3.  Essential hypertension continue therapy with amlodipine, isosorbide mononitrate, Toprol-XL  4.  Diabetes type 2 continue Amaryl and sliding scale insulin  5.  Coronary artery disease continue therapy with metoprolol and imdure  6.  Hyperlipidemia unspecified continue therapy with Lipitor  7.  Miscellaneous Lovenox for DVT prophylaxis     Code Status Orders  (From admission, onward)        Start     Ordered   11/13/17 2124  Full code  Continuous     11/13/17 2123    Code Status History    Date Active Date Inactive Code Status Order ID Comments User Context   09/22/2017 00:36 09/24/2017 18:37 Full Code [id number]  Gorden Harms, MD Inpatient   09/22/2017 00:33 09/22/2017 00:36 DNR [id number]  Gorden Harms, MD ED   12/02/2016 08:12 12/03/2016 20:09 Full Code [id number]  Vaughan Basta, MD Inpatient   07/11/2016 00:18 07/11/2016 17:07 Full Code [id number]  Lance Coon, MD Inpatient   06/18/2016 03:40 06/21/2016 18:44 Full Code [id number]  Saundra Shelling, MD ED           Consults none  DVT Prophylaxis  Lovenox  Lab Results  Component Value Date   PLT 181 11/14/2017     Time Spent in minutes   35 minutes spent  Greater than 50% of time spent in care coordination and counseling patient  regarding the condition and plan of care.   Dustin Flock M.D on 11/14/2017 at 6:37 PM  Between 7am to 6pm - Pager - [phone]  After 6pm go to [email or web address] - password [email or web address] [facility]   Office  [phone]

## 2017-11-14 NOTE — Progress Notes (Signed)
Physical Therapy Evaluation Patient Details Name: Brett [name] MRN: [id number] DOB: [birth year] Today's Date: 11/14/2017   History of Present Illness  Brett [name] is a 82 y.o. male has a past medical history significant for COPD and DM as well as Lymphoma now with SOB and fever. In ER, pt was hypoxic with leukocytosis and elevated lactic acid. Also tachycardic. He is now admitted. Denies CP or cough.No N/V/D. CXR shows LLL pneumonia. Pt admitted with acute respiratory failure secondary to CAP as well as SIRS.   Clinical Impression  Pt admitted with above diagnosis. Pt currently with functional limitations due to the deficits listed below (see PT Problem List).  Pt demonstrates good speed and sequencing with bed mobility. Able to perform transfers without UE support and no assistive device. Stable and safe with transfers. Able to complete a full lap around RN station without assistive device. Minimal head turning noted and pt ambulates with wide stance. No gross LOB with ambulation. HR increases to 122 bpm and SaO2 drops to 81% on 2L/min. Standing rest break with supplemental O2 increase to 4L/min for SaO2 to rebound to 90% after approximately 60-90 seconds of pursed lip breathing. Pt is safe with respect to his mobility to return home. He would benefit from [facility] PT to work on safety, endurance, and activity tolerance. He is AOx4 at time of evaluation but there are some concerns regarding self care given grossly unkept toenails. Minimal eye contact from patient during evaluation. Pt will benefit from PT services to address deficits in strength, balance, and mobility in order to return to full function at home.       Follow Up Recommendations Home health PT    Equipment Recommendations  None recommended by PT    Recommendations for Other Services       Precautions / Restrictions Precautions Precautions: Fall Restrictions Weight Bearing Restrictions: No      Mobility  Bed  Mobility Overal bed mobility: Modified Independent Bed Mobility: Supine to Sit;Sit to Supine     Supine to sit: Modified independent (Device/Increase time) Sit to supine: Modified independent (Device/Increase time)   General bed mobility comments: Pt demonstrates good speed and sequencing with bed mobility.  Transfers Overall transfer level: Needs assistance Equipment used: None Transfers: Sit to/from Stand Sit to Stand: Supervision         General transfer comment: Able to perform transfers without UE support and no assistive device. Stable and safe with transfers.   Ambulation/Gait Ambulation/Gait assistance: Supervision Ambulation Distance (Feet): 220 Feet Assistive device: None   Gait velocity: Decreased   General Gait Details: Able to complete a full lap around RN station without assistive device. Minimal head turning noted and pt ambulates with wide stance. No gross LOB with ambulation. HR increases to 122 bpm and SaO2 drops to 81% on 2L/min. Standing rest break with supplemental O2 increase to 4L/min for SaO2 to rebound to 90% after approximately 60-90 seconds of pursed lip breathing. Vitals monitored intermittently throughout evaluation  Stairs            Wheelchair Mobility    Modified Rankin (Stroke Patients Only)       Balance Overall balance assessment: Needs assistance Sitting-balance support: No upper extremity supported Sitting balance-Leahy Scale: Good     Standing balance support: No upper extremity supported Standing balance-Leahy Scale: Fair Standing balance comment: Negative [place]. Single leg balance approximately 1s on each LE  Pertinent Vitals/Pain Pain Assessment: No/denies pain    Home Living Family/patient expects to be discharged to:: Private residence Living Arrangements: Alone Available Help at Discharge: Friend(s);Available PRN/intermittently Type of Home: Other(Comment)(Condo) Home  Access: Stairs to enter Entrance Stairs-Rails: Right Entrance Stairs-Number of Steps: 3 Home Layout: One level Home Equipment: Kasandra Knudsen - quad;Walker - 2 wheels;Other (comment)(Home O2 at 2L/min)      Prior Function Level of Independence: Needs assistance   Gait / Transfers Assistance Needed: Pt reports he ambulates without assistive device. Denies falls  ADL's / Homemaking Assistance Needed: Reports independence with ADLs and mostly independent with IADLs. Doesn't drive        Hand Dominance   Dominant Hand: Right    Extremity/Trunk Assessment   Upper Extremity Assessment Upper Extremity Assessment: Overall WFL for tasks assessed    Lower Extremity Assessment Lower Extremity Assessment: Overall WFL for tasks assessed       Communication   Communication: HOH  Cognition Arousal/Alertness: Awake/alert Behavior During Therapy: WFL for tasks assessed/performed Overall Cognitive Status: Within Functional Limits for tasks assessed                                 General Comments: AOx4 at time of evaluation. Rarely makes eye contact during evaluation      General Comments      Exercises     Assessment/Plan    PT Assessment Patient needs continued PT services  PT Problem List Decreased activity tolerance;Decreased balance;Decreased mobility;Cardiopulmonary status limiting activity       PT Treatment Interventions DME instruction;Gait training;Stair training;Therapeutic activities;Functional mobility training;Therapeutic exercise;Balance training;Neuromuscular re-education;Patient/family education    PT Goals (Current goals can be found in the Care Plan section)  Acute Rehab PT Goals Patient Stated Goal: Return to prior function at home PT Goal Formulation: With patient Time For Goal Achievement: 11/28/17 Potential to Achieve Goals: Good    Frequency Min 2X/week   Barriers to discharge Decreased caregiver support Lives alone    Co-evaluation                AM-PAC PT "6 Clicks" Daily Activity  Outcome Measure Difficulty turning over in bed (including adjusting bedclothes, sheets and blankets)?: None Difficulty moving from lying on back to sitting on the side of the bed? : None Difficulty sitting down on and standing up from a chair with arms (e.g., wheelchair, bedside commode, etc,.)?: None Help needed moving to and from a bed to chair (including a wheelchair)?: None Help needed walking in hospital room?: None Help needed climbing 3-5 steps with a railing? : A Little 6 Click Score: 23    End of Session Equipment Utilized During Treatment: Gait belt Activity Tolerance: Patient tolerated treatment well Patient left: in bed;with call bell/phone within reach;with bed alarm set Nurse Communication: Other (comment)(SaO2 readings with ambulation) PT Visit Diagnosis: Difficulty in walking, not elsewhere classified (R26.2);Unsteadiness on feet (R26.81)    Time: 3300-7622 PT Time Calculation (min) (ACUTE ONLY): 26 min   Charges:   PT Evaluation $PT Eval Low Complexity: 1 Low PT Treatments $Gait Training: 8-22 mins   PT G Codes:   PT G-Codes **NOT FOR INPATIENT CLASS** Functional Assessment Tool Used: AM-PAC 6 Clicks Basic Mobility Functional Limitation: Mobility: Walking and moving around Mobility: Walking and Moving Around Current Status (Q3335): At least 1 percent but less than 20 percent impaired, limited or restricted Mobility: Walking and Moving Around Goal Status [phone]): At least  1 percent but less than 20 percent impaired, limited or restricted    Phillips Grout PT, DPT    Huprich,Jason 11/14/2017, 3:08 PM

## 2017-11-15 LAB — BASIC METABOLIC PANEL
ANION GAP: 8 (ref 5–15)
BUN: 40 mg/dL — ABNORMAL HIGH (ref 6–20)
CALCIUM: 7.4 mg/dL — AB (ref 8.9–10.3)
CO2: 24 mmol/L (ref 22–32)
CREATININE: 1.62 mg/dL — AB (ref 0.61–1.24)
Chloride: 111 mmol/L (ref 101–111)
GFR, EST AFRICAN AMERICAN: 45 mL/min — AB (ref >60)
GFR, EST NON AFRICAN AMERICAN: 38 mL/min — AB (ref >60)
Glucose, Bld: 230 mg/dL — ABNORMAL HIGH (ref 65–99)
Potassium: 4 mmol/L (ref 3.5–5.1)
SODIUM: 143 mmol/L (ref 135–145)

## 2017-11-15 LAB — GLUCOSE, CAPILLARY
GLUCOSE-CAPILLARY: 196 mg/dL — AB (ref 65–99)
GLUCOSE-CAPILLARY: 221 mg/dL — AB (ref 65–99)
Glucose-Capillary: 243 mg/dL — ABNORMAL HIGH (ref 65–99)
Glucose-Capillary: 166 mg/dL — ABNORMAL HIGH (ref 65–99)

## 2017-11-15 LAB — CBC
HEMATOCRIT: 30.1 % — AB (ref 40.0–52.0)
Hemoglobin: 9.6 g/dL — ABNORMAL LOW (ref 13.0–18.0)
MCH: 27.1 pg (ref 26.0–34.0)
MCHC: 32 g/dL (ref 32.0–36.0)
MCV: 84.5 fL (ref 80.0–100.0)
PLATELETS: 196 10*3/uL (ref 150–440)
RBC: 3.56 MIL/uL — ABNORMAL LOW (ref 4.40–5.90)
RDW: 17.6 % — AB (ref 11.5–14.5)
WBC: 16.9 10*3/uL — AB (ref 3.8–10.6)

## 2017-11-15 MED ORDER — METHYLPREDNISOLONE SODIUM SUCC 125 MG IJ SOLR
60.0000 mg | Freq: Two times a day (BID) | INTRAMUSCULAR | Status: DC
  Administered 2017-11-15 – 2017-11-16 (×2): 60 mg via INTRAVENOUS
  Filled 2017-11-15 (×2): qty 2

## 2017-11-15 MED ORDER — INSULIN GLARGINE 100 UNIT/ML ~~LOC~~ SOLN
8.0000 [IU] | Freq: Every day | SUBCUTANEOUS | Status: DC
  Administered 2017-11-15: 8 [IU] via SUBCUTANEOUS
  Filled 2017-11-15 (×2): qty 0.08

## 2017-11-15 MED ORDER — PANTOPRAZOLE SODIUM 40 MG PO TBEC
40.0000 mg | DELAYED_RELEASE_TABLET | Freq: Two times a day (BID) | ORAL | Status: DC
  Administered 2017-11-15 – 2017-11-16 (×2): 40 mg via ORAL
  Filled 2017-11-15 (×2): qty 1

## 2017-11-15 NOTE — Progress Notes (Signed)
[place] at [facility]                                                                                                                                                                                  Patient Demographics   Brett [name], is a 82 y.o. male, DOB - 28-Aug-1935, BHA:[id number]  Admit date - 11/13/2017   Admitting Physician Idelle Crouch, MD  Outpatient Primary MD for the patient is Perrin Maltese, MD   LOS - 2  Subjective: Patient breathing is improved but still having cough , heart rate slightly elevated    Review of Systems:   CONSTITUTIONAL: No documented fever. No fatigue, weakness. No weight gain, no weight loss.  EYES: No blurry or double vision.  ENT: No tinnitus. No postnasal drip. No redness of the oropharynx.  RESPIRATORY: Positive cough, no wheeze, no hemoptysis.  Positive dyspnea.  CARDIOVASCULAR: No chest pain. No orthopnea. No palpitations. No syncope.  GASTROINTESTINAL: No nausea, no vomiting or diarrhea. No abdominal pain. No melena or hematochezia.  GENITOURINARY: No dysuria or hematuria.  ENDOCRINE: No polyuria or nocturia. No heat or cold intolerance.  HEMATOLOGY: No anemia. No bruising. No bleeding.  INTEGUMENTARY: No rashes. No lesions.  MUSCULOSKELETAL: No arthritis. No swelling. No gout.  NEUROLOGIC: No numbness, tingling, or ataxia. No seizure-type activity.  PSYCHIATRIC: No anxiety. No insomnia. No ADD.    Vitals:   Vitals:   11/15/17 0127 11/15/17 0501 11/15/17 0800 11/15/17 0855  BP:  123/69 137/70   Pulse:  (!) 102 (!) 116   Resp:  18 18   Temp:  97.7 F (36.5 C) 97.8 F (36.6 C)   TempSrc:  Oral Oral   SpO2: 93% 94% 92% 90%  Weight:  193 lb 6.4 oz (87.7 kg)    Height:        Wt Readings from Last 3 Encounters:  11/15/17 193 lb 6.4 oz (87.7 kg)  09/21/17 182 lb 6.4 oz (82.7 kg)  12/08/16 173 lb (78.5 kg)     Intake/Output Summary (Last 24 hours) at 11/15/2017 1535 Last data filed at  11/15/2017 1529 Gross per 24 hour  Intake 2027.5 ml  Output 2420 ml  Net -392.5 ml    Physical Exam:   GENERAL: Pleasant-appearing in no apparent distress.  HEAD, EYES, EARS, NOSE AND THROAT: Atraumatic, normocephalic. Extraocular muscles are intact. Pupils equal and reactive to light. Sclerae anicteric. No conjunctival injection. No oro-pharyngeal erythema.  NECK: Supple. There is no jugular venous distention. No bruits, no lymphadenopathy, no thyromegaly.  HEART: Regular rate and rhythm,. No murmurs, no rubs, no clicks.  LUNGS: Rhonchus breath sounds ABDOMEN: Soft, flat, nontender, nondistended.  Has good bowel sounds. No hepatosplenomegaly appreciated.  EXTREMITIES: No evidence of any cyanosis, clubbing, or peripheral edema.  +2 pedal and radial pulses bilaterally.  NEUROLOGIC: The patient is alert, awake, and oriented x3 with no focal motor or sensory deficits appreciated bilaterally.  SKIN: Moist and warm with no rashes appreciated.  Psych: Not anxious, depressed LN: No inguinal LN enlargement    Antibiotics   Anti-infectives (From admission, onward)   Start     Dose/Rate Route Frequency Ordered Stop   11/14/17 1100  cefTRIAXone (ROCEPHIN) 1 g in dextrose 5 % 50 mL IVPB     1 g 100 mL/hr over 30 Minutes Intravenous Daily 11/14/17 1012     11/13/17 2100  azithromycin (ZITHROMAX) 500 mg in dextrose 5 % 250 mL IVPB     500 mg 250 mL/hr over 60 Minutes Intravenous Every 24 hours 11/13/17 1925     11/13/17 1815  ceFEPIme (MAXIPIME) 2 g in dextrose 5 % 50 mL IVPB     2 g 100 mL/hr over 30 Minutes Intravenous  Once 11/13/17 1811 11/13/17 1901   11/13/17 1815  vancomycin (VANCOCIN) IVPB 1000 mg/200 mL premix     1,000 mg 200 mL/hr over 60 Minutes Intravenous  Once 11/13/17 1811 11/13/17 2041      Medications   Scheduled Meds: . albuterol  3 mL Inhalation Q6H  . amLODipine  5 mg Oral Daily  . atorvastatin  20 mg Oral q1800  . chlorhexidine  15 mL Mouth Rinse BID  .  clopidogrel  75 mg Oral Daily  . docusate sodium  100 mg Oral BID  . furosemide  40 mg Oral Daily  . gabapentin  100 mg Oral BID  . heparin  5,000 Units Subcutaneous Q8H  . hydrALAZINE  50 mg Oral BID  . insulin aspart  0-9 Units Subcutaneous TID WC  . insulin glargine  8 Units Subcutaneous QHS  . isosorbide dinitrate  30 mg Oral Daily  . mouth rinse  15 mL Mouth Rinse q12n4p  . methylPREDNISolone (SOLU-MEDROL) injection  60 mg Intravenous Q6H  . metoprolol  200 mg Oral Daily  . mometasone-formoterol  2 puff Inhalation BID  . pantoprazole  40 mg Oral BID  . sodium chloride flush  3 mL Intravenous Q12H  . tiotropium  18 mcg Inhalation Daily   Continuous Infusions: . sodium chloride 75 mL/hr at 11/14/17 1857  . azithromycin Stopped (11/14/17 2206)  . cefTRIAXone (ROCEPHIN)  IV Stopped (11/15/17 0920)   PRN Meds:.acetaminophen **OR** acetaminophen, bisacodyl, fluticasone, ondansetron **OR** ondansetron (ZOFRAN) IV   Data Review:   Micro Results Recent Results (from the past 240 hour(s))  Culture, blood (routine x 2)     Status: None (Preliminary result)   Collection Time: 11/13/17  6:15 PM  Result Value Ref Range Status   Specimen Description BLOOD RT HAND  Final   Special Requests   Final    BOTTLES DRAWN AEROBIC AND ANAEROBIC Blood Culture results may not be optimal due to an excessive volume of blood received in culture bottles   Culture NO GROWTH 2 DAYS  Final   Report Status PENDING  Incomplete  Culture, blood (routine x 2)     Status: None (Preliminary result)   Collection Time: 11/13/17  6:15 PM  Result Value Ref Range Status   Specimen Description BLOOD LT [facility]  Final   Special Requests   Final    BOTTLES DRAWN AEROBIC AND ANAEROBIC Blood Culture adequate volume   Culture NO  GROWTH 2 DAYS  Final   Report Status PENDING  Incomplete    Radiology Reports Dg Chest Portable 1 View  Result Date: 11/13/2017 CLINICAL DATA:  B-cell lymphoma. Shortness of breath. Rule out  pneumonia. EXAM: PORTABLE CHEST 1 VIEW COMPARISON:  09/21/2017 FINDINGS: Cardiomegaly. Consolidation in the left lower lobe concerning for pneumonia. Small left effusion. No confluent opacity or effusion on the right. No acute bony abnormality. IMPRESSION: Left lower lobe opacity concerning for pneumonia. Small left effusion. Electronically Signed   By: Rolm Baptise M.D.   On: 11/13/2017 16:06     CBC Recent Labs  Lab 11/13/17 1608 11/14/17 0043 11/15/17 0306  WBC 19.3* 15.9* 16.9*  HGB 11.5* 9.6* 9.6*  HCT 35.3* 30.0* 30.1*  PLT 217 181 196  MCV 84.2 85.7 84.5  MCH 27.3 27.4 27.1  MCHC 32.5 32.0 32.0  RDW 17.1* 17.3* 17.6*  LYMPHSABS 0.8*  --   --   MONOABS 1.5*  --   --   EOSABS 0.0  --   --   BASOSABS 0.1  --   --     Chemistries  Recent Labs  Lab 11/13/17 1608 11/14/17 0043 11/15/17 0306  NA 140 140 143  K 4.2 4.1 4.0  CL 102 108 111  CO2 27 20* 24  GLUCOSE 160* 345* 230*  BUN 22* 26* 40*  CREATININE 1.64* 1.81* 1.62*  CALCIUM 8.2* 7.1* 7.4*  AST 23 38  --   ALT 15* 14*  --   ALKPHOS 78 56  --   BILITOT 0.8 0.5  --    ------------------------------------------------------------------------------------------------------------------ estimated creatinine clearance is 40.6 mL/min (A) (by C-G formula based on SCr of 1.62 mg/dL (H)). ------------------------------------------------------------------------------------------------------------------ No results for input(s): HGBA1C in the last 72 hours. ------------------------------------------------------------------------------------------------------------------ No results for input(s): CHOL, HDL, LDLCALC, TRIG, CHOLHDL, LDLDIRECT in the last 72 hours. ------------------------------------------------------------------------------------------------------------------ No results for input(s): TSH, T4TOTAL, T3FREE, THYROIDAB in the last 72 hours.  Invalid input(s):  FREET3 ------------------------------------------------------------------------------------------------------------------ No results for input(s): VITAMINB12, FOLATE, FERRITIN, TIBC, IRON, RETICCTPCT in the last 72 hours.  Coagulation profile No results for input(s): INR, PROTIME in the last 168 hours.  No results for input(s): DDIMER in the last 72 hours.  Cardiac Enzymes Recent Labs  Lab 11/13/17 1608  TROPONINI 0.04*   ------------------------------------------------------------------------------------------------------------------ Invalid input(s): POCBNP    Assessment & Plan   Patient is a 82 year old with shortness of breath  1.  Community-acquired pneumonia : Continue ceftriaxone and azithromycin  2.  Acute on chronic COPD exasperation continue therapy with nebulizer steroids and home inhalers  3.  Essential hypertension continue therapy with amlodipine, isosorbide mononitrate, Toprol-XL  4.  Diabetes type 2 continue Amaryl and sliding scale insulin  5.  Coronary artery disease continue therapy with metoprolol and imdure  6.  Hyperlipidemia unspecified continue therapy with Lipitor  7.  Miscellaneous Lovenox for DVT prophylaxis     Code Status Orders  (From admission, onward)        Start     Ordered   11/13/17 2124  Full code  Continuous     11/13/17 2123    Code Status History    Date Active Date Inactive Code Status Order ID Comments User Context   09/22/2017 00:36 09/24/2017 18:37 Full Code [id number]  Gorden Harms, MD Inpatient   09/22/2017 00:33 09/22/2017 00:36 DNR [id number]  Gorden Harms, MD ED   12/02/2016 08:12 12/03/2016 20:09 Full Code [id number]  Vaughan Basta, MD Inpatient   07/11/2016 00:18 07/11/2016 17:07 Full  Code [id number]  Lance Coon, MD Inpatient   06/18/2016 03:40 06/21/2016 18:44 Full Code [id number]  Saundra Shelling, MD ED           Consults none  DVT Prophylaxis  Lovenox  Lab Results  Component Value Date    PLT 196 11/15/2017     Time Spent in minutes   59minutes spent  Greater than 50% of time spent in care coordination and counseling patient regarding the condition and plan of care.   Dustin Flock M.D on 11/15/2017 at 3:35 PM  Between 7am to 6pm - Pager - [phone]  After 6pm go to [email or web address] - password [email or web address] [place] [facility]   Office  [phone]

## 2017-11-15 NOTE — Plan of Care (Signed)
  Progressing Education: Knowledge of General Education information will improve 11/15/2017 1003 - Progressing by Rolley Sims, RN Health Behavior/Discharge Planning: Ability to manage health-related needs will improve 11/15/2017 1003 - Progressing by Rolley Sims, RN Clinical Measurements: Ability to maintain clinical measurements within normal limits will improve 11/15/2017 1003 - Progressing by Rolley Sims, RN Will remain free from infection 11/15/2017 1003 - Progressing by Rolley Sims, RN

## 2017-11-15 NOTE — Progress Notes (Signed)
PHARMACIST - PHYSICIAN COMMUNICATION  DR:   Posey Pronto  CONCERNING: IV to Oral Route Change Policy  RECOMMENDATION: This patient is receiving protonix by the intravenous route.  Based on criteria approved by the Pharmacy and Therapeutics Committee, the intravenous medication(s) is/are being converted to the equivalent oral dose form(s).   DESCRIPTION: These criteria include:  The patient is eating (either orally or via tube) and/or has been taking other orally administered medications for a least 24 hours  The patient has no evidence of active gastrointestinal bleeding or impaired GI absorption (gastrectomy, short bowel, patient on TNA or NPO).  If you have questions about this conversion, please contact the Pharmacy Department  []   [phone] )  Brett [name] [x]   [phone] )  Brett [name] []   [phone] )  Brett [name] []   [phone] )  Brett [name] []   [phone] )  Brett [name], [name] 11/15/2017 9:00 AM

## 2017-11-15 NOTE — Progress Notes (Signed)
Inpatient Diabetes Program Recommendations  AACE/ADA: New Consensus Statement on Inpatient Glycemic Control (2015)  Target Ranges:  Prepandial:   less than 140 mg/dL      Peak postprandial:   less than 180 mg/dL (1-2 hours)      Critically ill patients:  140 - 180 mg/dL   Lab Results  Component Value Date   GLUCAP 221 (H) 11/15/2017   HGBA1C 6.0 (H) 09/22/2017    Review of Glycemic Control  Results for ABHAY, GODBOLT (MRN [id number]) as of 11/15/2017 13:48  Ref. Range 11/14/2017 17:01 11/14/2017 21:20 11/15/2017 08:07 11/15/2017 11:44  Glucose-Capillary Latest Ref Range: 65 - 99 mg/dL 212 (H) 244 (H) 196 (H) 221 (H)     Diabetes history: DM 2 Outpatient Diabetes medications: Amaryl 1 mg BID  Current orders for Inpatient glycemic control: Amaryl 1 mg BID, Novolog Sensitive Correction 0-9 units tid * steroids 60mg  q6h  Inpatient Diabetes Program Recommendations:     Glimepiride is high risk for hypoglycemia in the acute care setting. Consider d/cing Glimepiride while inpatient and starting low dose basal insulin- Lantus 13 units qhs  Consider increasing dose of  Novolog Correction scale to Novolog Moderate Correction 0-15 units tid and add Novolog 0-5 units qhs  Gentry Fitz, RN, [place], [place], CDE Diabetes Coordinator Inpatient Diabetes Program  [phone] (Team Pager) [phone] ([place]) 11/15/2017 1:51 PM

## 2017-11-15 NOTE — Plan of Care (Signed)
  Progressing Education: Knowledge of General Education information will improve 11/15/2017 0327 - Progressing by Rolland Bimler, [place] Behavior/Discharge Planning: Ability to manage health-related needs will improve 11/15/2017 0327 - Progressing by Rolland Bimler, RN Clinical Measurements: Ability to maintain clinical measurements within normal limits will improve 11/15/2017 0327 - Progressing by Rolland Bimler, RN Will remain free from infection 11/15/2017 0327 - Progressing by Rolland Bimler, RN Diagnostic test results will improve 11/15/2017 0327 - Progressing by Rolland Bimler, RN Respiratory complications will improve 11/15/2017 0327 - Progressing by Rolland Bimler, RN Cardiovascular complication will be avoided 11/15/2017 0327 - Progressing by Rolland Bimler, RN Activity: Risk for activity intolerance will decrease 11/15/2017 0327 - Progressing by Rolland Bimler, RN Elimination: Will not experience complications related to bowel motility 11/15/2017 0327 - Progressing by Rolland Bimler, RN Will not experience complications related to urinary retention 11/15/2017 0327 - Progressing by Rolland Bimler, RN Pain Managment: General experience of comfort will improve 11/15/2017 0327 - Progressing by Rolland Bimler, RN Safety: Ability to remain free from injury will improve 11/15/2017 0327 - Progressing by Rolland Bimler, RN Skin Integrity: Risk for impaired skin integrity will decrease 11/15/2017 0327 - Progressing by Rolland Bimler, RN Fluid Volume: Hemodynamic stability will improve 11/15/2017 0327 - Progressing by Rolland Bimler, RN Clinical Measurements: Diagnostic test results will improve 11/15/2017 0327 - Progressing by Rolland Bimler, RN Signs and symptoms of infection will decrease 11/15/2017 0327 - Progressing by Rolland Bimler,  RN Respiratory: Ability to maintain adequate ventilation will improve 11/15/2017 0327 - Progressing by Rolland Bimler, RN

## 2017-11-16 LAB — BASIC METABOLIC PANEL WITH GFR
Anion gap: 9 (ref 5–15)
BUN: 51 mg/dL — ABNORMAL HIGH (ref 6–20)
CO2: 24 mmol/L (ref 22–32)
Calcium: 7.6 mg/dL — ABNORMAL LOW (ref 8.9–10.3)
Chloride: 109 mmol/L (ref 101–111)
Creatinine, Ser: 1.63 mg/dL — ABNORMAL HIGH (ref 0.61–1.24)
GFR calc Af Amer: 44 mL/min — ABNORMAL LOW
GFR calc non Af Amer: 38 mL/min — ABNORMAL LOW
Glucose, Bld: 195 mg/dL — ABNORMAL HIGH (ref 65–99)
Potassium: 3.4 mmol/L — ABNORMAL LOW (ref 3.5–5.1)
Sodium: 142 mmol/L (ref 135–145)

## 2017-11-16 LAB — CBC
HEMATOCRIT: 29.4 % — AB (ref 40.0–52.0)
HEMOGLOBIN: 9.6 g/dL — AB (ref 13.0–18.0)
MCH: 27.4 pg (ref 26.0–34.0)
MCHC: 32.7 g/dL (ref 32.0–36.0)
MCV: 83.8 fL (ref 80.0–100.0)
Platelets: 199 10*3/uL (ref 150–440)
RBC: 3.51 MIL/uL — AB (ref 4.40–5.90)
RDW: 17.6 % — ABNORMAL HIGH (ref 11.5–14.5)
WBC: 11.1 10*3/uL — ABNORMAL HIGH (ref 3.8–10.6)

## 2017-11-16 LAB — GLUCOSE, CAPILLARY
GLUCOSE-CAPILLARY: 210 mg/dL — AB (ref 65–99)
Glucose-Capillary: 169 mg/dL — ABNORMAL HIGH (ref 65–99)

## 2017-11-16 MED ORDER — LEVOFLOXACIN 500 MG PO TABS: 500.0000 mg | ORAL_TABLET | Freq: Every day | ORAL | 0 refills | Status: AC

## 2017-11-16 MED ORDER — PREDNISONE 10 MG (21) PO TBPK: ORAL_TABLET | ORAL | 0 refills | Status: DC

## 2017-11-16 MED ORDER — ALBUTEROL SULFATE (2.5 MG/3ML) 0.083% IN NEBU: 3.0000 mL | INHALATION_SOLUTION | Freq: Four times a day (QID) | RESPIRATORY_TRACT | Status: DC | PRN

## 2017-11-16 NOTE — Care Management Important Message (Signed)
Important Message  Patient Details  Name: Brett [name] MRN: [id number] Date of Birth: 1935-07-24   Medicare Important Message Given:  Yes    [name], RN 11/16/2017, 12:36 PM

## 2017-11-16 NOTE — Plan of Care (Signed)
Pt is A&Ox4. VSS. 2L O2  continued. Pt voids in urinal. IV antibiotic continued per order. No complaints thus far. Will continue to monitor and report to oncoming RN .  Progressing Education: Knowledge of General Education information will improve 11/16/2017 0032 - Progressing by Aleen Campi, RN Health Behavior/Discharge Planning: Ability to manage health-related needs will improve 11/16/2017 0032 - Progressing by Aleen Campi, RN Clinical Measurements: Ability to maintain clinical measurements within normal limits will improve 11/16/2017 0032 - Progressing by Aleen Campi, RN Will remain free from infection 11/16/2017 0032 - Progressing by Aleen Campi, RN Diagnostic test results will improve 11/16/2017 0032 - Progressing by Aleen Campi, RN Respiratory complications will improve 11/16/2017 0032 - Progressing by Aleen Campi, RN Cardiovascular complication will be avoided 11/16/2017 0032 - Progressing by Aleen Campi, RN Activity: Risk for activity intolerance will decrease 11/16/2017 0032 - Progressing by Aleen Campi, RN Elimination: Will not experience complications related to bowel motility 11/16/2017 0032 - Progressing by Aleen Campi, RN Will not experience complications related to urinary retention 11/16/2017 0032 - Progressing by Aleen Campi, RN Pain Managment: General experience of comfort will improve 11/16/2017 0032 - Progressing by Aleen Campi, RN Safety: Ability to remain free from injury will improve 11/16/2017 0032 - Progressing by Aleen Campi, RN Skin Integrity: Risk for impaired skin integrity will decrease 11/16/2017 0032 - Progressing by Aleen Campi, RN Fluid Volume: Hemodynamic stability will improve 11/16/2017 0032 - Progressing by Aleen Campi, RN Clinical Measurements: Diagnostic test results will improve 11/16/2017 0032 - Progressing by Aleen Campi, RN Signs and symptoms of infection will decrease 11/16/2017 0032 -  Progressing by Aleen Campi, RN Respiratory: Ability to maintain adequate ventilation will improve 11/16/2017 0032 - Progressing by Aleen Campi, RN

## 2017-11-16 NOTE — Discharge Instructions (Signed)
[facility] - [place] at [facility] ° °DIET:  °Cardiac diet ° °DISCHARGE CONDITION:  °Stable ° °ACTIVITY:  °Activity as tolerated ° °OXYGEN:  °Home Oxygen: Yes.   °  °Oxygen Delivery: 2 liters/min via Patient connected to nasal cannula oxygen ° °DISCHARGE LOCATION:  °home  ° ° °ADDITIONAL DISCHARGE INSTRUCTION: ° ° °If you experience worsening of your admission symptoms, develop shortness of breath, life threatening emergency, suicidal or homicidal thoughts you must seek medical attention immediately by calling 911 or calling your MD immediately  if symptoms less severe. ° °You Must read complete instructions/literature along with all the possible adverse reactions/side effects for all the Medicines you take and that have been prescribed to you. Take any new Medicines after you have completely understood and accpet all the possible adverse reactions/side effects.  ° °Please note ° °You were cared for by a hospitalist during your hospital stay. If you have any questions about your discharge medications or the care you received while you were in the hospital after you are discharged, you can call the unit and asked to speak with the hospitalist on call if the hospitalist that took care of you is not available. Once you are discharged, your primary care physician will handle any further medical issues. Please note that NO REFILLS for any discharge medications will be authorized once you are discharged, as it is imperative that you return to your primary care physician (or establish a relationship with a primary care physician if you do not have one) for your aftercare needs so that they can reassess your need for medications and monitor your lab values. ° ° °

## 2017-11-16 NOTE — Progress Notes (Signed)
CBG 210 per Gerald Stabs NT. Glucometer has not synched yet.

## 2017-11-16 NOTE — Care Management Note (Signed)
Case Management Note  Patient Details  Name: Brett [name] MRN: [id number] Date of Birth: 1935-07-27    Patient admitted from home with respiratory failure.  Patient lives at home alone.  [name] provides transportation. PCP [name].  Pharmacy [facility].  PT has assessed patient and recommends home health PT. Patient agreeable to services.  Patient has had [name] in the past and would like to use them again.  [name] with [facility] notified of referral.  Patient has chronic home O2 through [facility], a RW, and cane.  [name] is to bring portable tank for discharge.  Home health orders requested.  RNCM signing off.    Subjective/Objective:                    Action/Plan:   Expected Discharge Date:  11/16/17               Expected Discharge Plan:  [name]  In-House Referral:     Discharge planning Services  CM Consult  Post Acute Care Choice:  Home Health Choice offered to:  Patient  DME Arranged:    DME Agency:     HH Arranged:  RN, PT [name] Agency:  [name]  Status of Service:  Completed, signed off  If discussed at [place] of Stay Meetings, dates discussed:    Additional Comments:  [name], RN 11/16/2017, 12:39 PM

## 2017-11-16 NOTE — Progress Notes (Signed)
[name] to be D/C'd Home per MD order. Patient given discharge teaching and paperwork regarding medications, diet, follow-up appointments and activity. Patient understanding verbalized. No questions or complaints at this time. Skin condition as charted. IV and telemetry removed prior to leaving.  No further needs by Care Management/Social Work - [place] set up. Prescriptions sent to pharmacy by MD.   An After Visit Summary was printed and given to the patient. Caregiver/family present during discharge teaching.   Patient discharged on portable home O2 tank.   Brett [name]

## 2017-11-16 NOTE — Discharge Summary (Signed)
[place] at [facility], 82 y.o., DOB Jun 15, 1935, MRN [id number]. Admission date: 11/13/2017 Discharge Date 11/16/2017 Primary MD Perrin Maltese, MD Admitting Physician Idelle Crouch, MD  Admission Diagnosis  HCAP (healthcare-associated pneumonia) [J18.9]  Discharge Diagnosis   Principal Problem: Community-acquired pneumonia Acute on chronic COPD exasperation Essential hypertension Diabetes type 2 Coronary artery disease Hyperlipidemia     Hospital Course  CODEN FRANCHI is a 82 y.o. male has a past medical history significant for COPD and Dm as well as Lymphoma now with SOB and fever. In ER, pt was hypoxic with leukocytosis and elevated lactic acid. Also tachycardic.  CXR shows LLL pneumonia.   Patient was admitted for IV antibiotics and treated with nebulizers and oral steroids.  His breathing is much improved and is stable to return back to home.              Consults  None  Significant Tests:  See full reports for all details      Dg Chest Portable 1 View  Result Date: 11/13/2017 CLINICAL DATA:  B-cell lymphoma. Shortness of breath. Rule out pneumonia. EXAM: PORTABLE CHEST 1 VIEW COMPARISON:  09/21/2017 FINDINGS: Cardiomegaly. Consolidation in the left lower lobe concerning for pneumonia. Small left effusion. No confluent opacity or effusion on the right. No acute bony abnormality. IMPRESSION: Left lower lobe opacity concerning for pneumonia. Small left effusion. Electronically Signed   By: Rolm Baptise M.D.   On: 11/13/2017 16:06       Today   Subjective:   Brett [name] feeling much better denies any complaint Objective:   Blood pressure (!) 146/78, pulse 91, temperature (!) 97.5 F (36.4 C), temperature source Oral, resp. rate 18, height 5\' 10"  (1.778 m), weight 192 lb 6.4 oz (87.3 kg), SpO2 95 %.  .  Intake/Output Summary (Last 24 hours) at 11/16/2017 1655 Last data filed at 11/16/2017 0500 Gross  per 24 hour  Intake 120 ml  Output 1030 ml  Net -910 ml    Exam VITAL SIGNS: Blood pressure (!) 146/78, pulse 91, temperature (!) 97.5 F (36.4 C), temperature source Oral, resp. rate 18, height 5\' 10"  (1.778 m), weight 192 lb 6.4 oz (87.3 kg), SpO2 95 %.  GENERAL:  82 y.o.-year-old patient lying in the bed with no acute distress.  EYES: Pupils equal, round, reactive to light and accommodation. No scleral icterus. Extraocular muscles intact.  HEENT: Head atraumatic, normocephalic. Oropharynx and nasopharynx clear.  NECK:  Supple, no jugular venous distention. No thyroid enlargement, no tenderness.  LUNGS: Normal breath sounds bilaterally, no wheezing, rales,rhonchi or crepitation. No use of accessory muscles of respiration.  CARDIOVASCULAR: S1, S2 normal. No murmurs, rubs, or gallops.  ABDOMEN: Soft, nontender, nondistended. Bowel sounds present. No organomegaly or mass.  EXTREMITIES: No pedal edema, cyanosis, or clubbing.  NEUROLOGIC: Cranial nerves II through XII are intact. Muscle strength 5/5 in all extremities. Sensation intact. Gait not checked.  PSYCHIATRIC: The patient is alert and oriented x 3.  SKIN: No obvious rash, lesion, or ulcer.   Data Review     CBC w Diff:  Lab Results  Component Value Date   WBC 11.1 (H) 11/16/2017   HGB 9.6 (L) 11/16/2017   HGB 12.5 (L) 05/07/2010   HCT 29.4 (L) 11/16/2017   HCT 36.5 (L) 05/07/2010   PLT 199 11/16/2017   PLT 254 05/07/2010   LYMPHOPCT 4 11/13/2017   LYMPHOPCT 15.8 05/07/2010   MONOPCT 8 11/13/2017  MONOPCT 7.7 05/07/2010   EOSPCT 0 11/13/2017   EOSPCT 4.4 05/07/2010   BASOPCT 1 11/13/2017   BASOPCT 0.5 05/07/2010   CMP:  Lab Results  Component Value Date   NA 142 11/16/2017   NA 138 05/07/2010   K 3.4 (L) 11/16/2017   K 4.2 05/07/2010   CL 109 11/16/2017   CL 101 05/07/2010   CO2 24 11/16/2017   CO2 29 05/07/2010   BUN 51 (H) 11/16/2017   BUN 30 (H) 05/07/2010   CREATININE 1.63 (H) 11/16/2017    CREATININE 1.5 (H) 05/07/2010   PROT 5.7 (L) 11/14/2017   PROT 6.6 05/07/2010   ALBUMIN 2.9 (L) 11/14/2017   ALBUMIN 4.0 05/07/2010   BILITOT 0.5 11/14/2017   BILITOT 0.50 05/07/2010   ALKPHOS 56 11/14/2017   ALKPHOS 98 (H) 05/07/2010   AST 38 11/14/2017   AST 18 05/07/2010   ALT 14 (L) 11/14/2017   ALT 20 05/07/2010  .  Micro Results Recent Results (from the past 240 hour(s))  Culture, blood (routine x 2)     Status: None (Preliminary result)   Collection Time: 11/13/17  6:15 PM  Result Value Ref Range Status   Specimen Description BLOOD RT HAND  Final   Special Requests   Final    BOTTLES DRAWN AEROBIC AND ANAEROBIC Blood Culture results may not be optimal due to an excessive volume of blood received in culture bottles   Culture NO GROWTH 3 DAYS  Final   Report Status PENDING  Incomplete  Culture, blood (routine x 2)     Status: None (Preliminary result)   Collection Time: 11/13/17  6:15 PM  Result Value Ref Range Status   Specimen Description BLOOD LT [facility]  Final   Special Requests   Final    BOTTLES DRAWN AEROBIC AND ANAEROBIC Blood Culture adequate volume   Culture NO GROWTH 3 DAYS  Final   Report Status PENDING  Incomplete        Code Status Orders  (From admission, onward)        Start     Ordered   11/13/17 2124  Full code  Continuous     11/13/17 2123    Code Status History    Date Active Date Inactive Code Status Order ID Comments User Context   09/22/2017 00:36 09/24/2017 18:37 Full Code [id number]  Gorden Harms, MD Inpatient   09/22/2017 00:33 09/22/2017 00:36 DNR [id number]  Gorden Harms, MD ED   12/02/2016 08:12 12/03/2016 20:09 Full Code [id number]  Vaughan Basta, MD Inpatient   07/11/2016 00:18 07/11/2016 17:07 Full Code [id number]  Lance Coon, MD Inpatient   06/18/2016 03:40 06/21/2016 18:44 Full Code [id number]  Saundra Shelling, MD ED          Follow-up Information    Perrin Maltese, MD. Go on 11/25/2017.   Specialty:  Internal  Medicine Why:  @2 :30 PM Contact information: [place] [id number] [phone]           Discharge Medications   Allergies as of 11/16/2017   No Known Allergies     Medication List    STOP taking these medications   predniSONE 10 MG tablet Commonly known as:  DELTASONE Replaced by:  predniSONE 10 MG (21) Tbpk tablet     TAKE these medications   albuterol (2.5 MG/3ML) 0.083% nebulizer solution Commonly known as:  PROVENTIL Take 3 mLs (2.5 mg total) by nebulization every 6 (six) hours as needed for wheezing or  shortness of breath.   albuterol 108 (90 Base) MCG/ACT inhaler Commonly known as:  PROVENTIL HFA;VENTOLIN HFA Inhale 2 puffs into the lungs every 6 (six) hours as needed for wheezing or shortness of breath.   amLODipine 5 MG tablet Commonly known as:  NORVASC Take 5 mg by mouth daily.   atorvastatin 20 MG tablet Commonly known as:  LIPITOR Take 1 tablet (20 mg total) by mouth daily at 6 PM.   clopidogrel 75 MG tablet Commonly known as:  PLAVIX Take 75 mg by mouth daily.   eplerenone 25 MG tablet Commonly known as:  INSPRA Take 50 mg by mouth daily.   fluticasone 50 MCG/ACT nasal spray Commonly known as:  FLONASE Place 2 sprays into the nose daily as needed for allergies.   Fluticasone-Salmeterol 250-50 MCG/DOSE Aepb Commonly known as:  ADVAIR DISKUS Inhale 1 puff into the lungs 2 times daily at 12 noon and 4 pm.   furosemide 40 MG tablet Commonly known as:  LASIX Take 1 tablet (40 mg total) by mouth daily.   gabapentin 100 MG capsule Commonly known as:  NEURONTIN Take 1 capsule by mouth 2 (two) times daily.   glimepiride 1 MG tablet Commonly known as:  AMARYL Take 1 mg by mouth 2 (two) times daily.   hydrALAZINE 25 MG tablet Commonly known as:  APRESOLINE Take 2 tablets (50 mg total) by mouth 3 (three) times daily. What changed:  when to take this   isosorbide dinitrate 30 MG tablet Commonly known as:  ISORDIL Take 30  mg by mouth daily.   levofloxacin 500 MG tablet Commonly known as:  LEVAQUIN Take 1 tablet (500 mg total) by mouth daily for 5 days.   metoprolol 200 MG 24 hr tablet Commonly known as:  TOPROL-XL Take 1 tablet (200 mg total) by mouth daily.   nicotine 7 mg/24hr patch Commonly known as:  NICODERM CQ - dosed in mg/24 hr Place 1 patch (7 mg total) onto the skin daily.   pantoprazole 40 MG tablet Commonly known as:  PROTONIX Take 40 mg by mouth daily.   predniSONE 10 MG (21) Tbpk tablet Commonly known as:  STERAPRED UNI-PAK 21 TAB Start at 60mg  taper by 10mg  until complete Replaces:  predniSONE 10 MG tablet   senna-docusate 8.6-50 MG tablet Commonly known as:  Senokot-S Take 1 tablet by mouth 2 (two) times daily.   tiotropium 18 MCG inhalation capsule Commonly known as:  SPIRIVA HANDIHALER Place 1 capsule (18 mcg total) into inhaler and inhale daily.          Total Time in preparing paper work, data evaluation and todays exam - 35 minutes  Dustin Flock M.D on 11/16/2017 at 4:55 PM  [facility]   Office  [phone]

## 2017-11-16 NOTE — Progress Notes (Signed)
PT Cancellation Note  Patient Details Name: Brett [name] MRN: [id number] DOB: 04-May-1935   Cancelled Treatment:    Reason Eval/Treat Not Completed: Other (comment). Treatment attempted; pt discharging home today.    Larae Grooms, PTA 11/16/2017, 12:04 PM

## 2017-11-18 LAB — CULTURE, BLOOD (ROUTINE X 2)
CULTURE: NO GROWTH
CULTURE: NO GROWTH
SPECIAL REQUESTS: ADEQUATE

## 2017-11-24 NOTE — Progress Notes (Signed)
[facility]  Patient Status: Unable to locate patient, MD and Isaias Cowman, RNCM have been notified. Not taken under care at this time for [facility] services.   Florene Glen 11/24/2017, 9:49 AM

## 2017-12-05 ENCOUNTER — Emergency Department: Payer: Medicare PPO

## 2017-12-05 ENCOUNTER — Encounter: Payer: Self-pay | Admitting: *Deleted

## 2017-12-05 ENCOUNTER — Other Ambulatory Visit: Payer: Self-pay

## 2017-12-05 ENCOUNTER — Inpatient Hospital Stay
Admission: EM | Admit: 2017-12-05 | Discharge: 2017-12-08 | DRG: 190 | Disposition: A | Discharge status: Home Health | Payer: Medicare PPO | Attending: Internal Medicine | Admitting: Internal Medicine

## 2017-12-05 DIAGNOSIS — D509 Iron deficiency anemia, unspecified: Secondary | ICD-10-CM | POA: Diagnosis present

## 2017-12-05 DIAGNOSIS — Z9981 Dependence on supplemental oxygen: Secondary | ICD-10-CM

## 2017-12-05 DIAGNOSIS — J9621 Acute and chronic respiratory failure with hypoxia: Secondary | ICD-10-CM | POA: Diagnosis present

## 2017-12-05 DIAGNOSIS — Z8701 Personal history of pneumonia (recurrent): Secondary | ICD-10-CM

## 2017-12-05 DIAGNOSIS — R778 Other specified abnormalities of plasma proteins: Secondary | ICD-10-CM

## 2017-12-05 DIAGNOSIS — I129 Hypertensive chronic kidney disease with stage 1 through stage 4 chronic kidney disease, or unspecified chronic kidney disease: Secondary | ICD-10-CM | POA: Diagnosis present

## 2017-12-05 DIAGNOSIS — I080 Rheumatic disorders of both mitral and aortic valves: Secondary | ICD-10-CM | POA: Diagnosis present

## 2017-12-05 DIAGNOSIS — H9190 Unspecified hearing loss, unspecified ear: Secondary | ICD-10-CM | POA: Diagnosis present

## 2017-12-05 DIAGNOSIS — R748 Abnormal levels of other serum enzymes: Secondary | ICD-10-CM | POA: Diagnosis present

## 2017-12-05 DIAGNOSIS — R0689 Other abnormalities of breathing: Secondary | ICD-10-CM

## 2017-12-05 DIAGNOSIS — J441 Chronic obstructive pulmonary disease with (acute) exacerbation: Secondary | ICD-10-CM | POA: Diagnosis present

## 2017-12-05 DIAGNOSIS — R000 Tachycardia, unspecified: Secondary | ICD-10-CM | POA: Diagnosis present

## 2017-12-05 DIAGNOSIS — R7989 Other specified abnormal findings of blood chemistry: Secondary | ICD-10-CM

## 2017-12-05 DIAGNOSIS — I2510 Atherosclerotic heart disease of native coronary artery without angina pectoris: Secondary | ICD-10-CM | POA: Diagnosis present

## 2017-12-05 DIAGNOSIS — J9622 Acute and chronic respiratory failure with hypercapnia: Secondary | ICD-10-CM | POA: Diagnosis present

## 2017-12-05 DIAGNOSIS — N182 Chronic kidney disease, stage 2 (mild): Secondary | ICD-10-CM | POA: Diagnosis present

## 2017-12-05 DIAGNOSIS — N179 Acute kidney failure, unspecified: Secondary | ICD-10-CM | POA: Diagnosis present

## 2017-12-05 DIAGNOSIS — Z8572 Personal history of non-Hodgkin lymphomas: Secondary | ICD-10-CM

## 2017-12-05 DIAGNOSIS — E1151 Type 2 diabetes mellitus with diabetic peripheral angiopathy without gangrene: Secondary | ICD-10-CM | POA: Diagnosis present

## 2017-12-05 DIAGNOSIS — Z8711 Personal history of peptic ulcer disease: Secondary | ICD-10-CM

## 2017-12-05 DIAGNOSIS — Z9221 Personal history of antineoplastic chemotherapy: Secondary | ICD-10-CM

## 2017-12-05 DIAGNOSIS — E1122 Type 2 diabetes mellitus with diabetic chronic kidney disease: Secondary | ICD-10-CM | POA: Diagnosis present

## 2017-12-05 DIAGNOSIS — Z87891 Personal history of nicotine dependence: Secondary | ICD-10-CM

## 2017-12-05 HISTORY — DX: Type 2 diabetes mellitus without complications: E11.9

## 2017-12-05 HISTORY — DX: Essential (primary) hypertension: I10

## 2017-12-05 LAB — CBC WITH DIFFERENTIAL/PLATELET
Basophils Absolute: 0 10*3/uL (ref 0–0.1)
Basophils Relative: 1 %
EOS ABS: 0.2 10*3/uL (ref 0–0.7)
Eosinophils Relative: 3 %
HCT: 35 % — ABNORMAL LOW (ref 40.0–52.0)
Hemoglobin: 11.5 g/dL — ABNORMAL LOW (ref 13.0–18.0)
LYMPHS ABS: 1.3 10*3/uL (ref 1.0–3.6)
Lymphocytes Relative: 16 %
MCH: 27.7 pg (ref 26.0–34.0)
MCHC: 32.8 g/dL (ref 32.0–36.0)
MCV: 84.5 fL (ref 80.0–100.0)
MONO ABS: 0.7 10*3/uL (ref 0.2–1.0)
MONOS PCT: 9 %
Neutro Abs: 5.9 10*3/uL (ref 1.4–6.5)
Neutrophils Relative %: 71 %
PLATELETS: 240 10*3/uL (ref 150–440)
RBC: 4.14 MIL/uL — ABNORMAL LOW (ref 4.40–5.90)
RDW: 17.8 % — ABNORMAL HIGH (ref 11.5–14.5)
WBC: 8.3 10*3/uL (ref 3.8–10.6)

## 2017-12-05 LAB — URINALYSIS, ROUTINE W REFLEX MICROSCOPIC
Bilirubin Urine: NEGATIVE
Glucose, UA: 50 mg/dL — AB
Ketones, ur: NEGATIVE mg/dL
Leukocytes, UA: NEGATIVE
Nitrite: NEGATIVE
PROTEIN: 100 mg/dL — AB
SPECIFIC GRAVITY, URINE: 1.023 (ref 1.005–1.030)
pH: 5 (ref 5.0–8.0)

## 2017-12-05 LAB — TROPONIN I: Troponin I: 0.04 ng/mL (ref <0.03)

## 2017-12-05 LAB — INFLUENZA PANEL BY PCR (TYPE A & B)
INFLAPCR: NEGATIVE
INFLBPCR: NEGATIVE

## 2017-12-05 LAB — COMPREHENSIVE METABOLIC PANEL
ALT: 14 U/L — AB (ref 17–63)
AST: 17 U/L (ref 15–41)
Albumin: 3.6 g/dL (ref 3.5–5.0)
Alkaline Phosphatase: 88 U/L (ref 38–126)
Anion gap: 12 (ref 5–15)
BUN: 28 mg/dL — ABNORMAL HIGH (ref 6–20)
CHLORIDE: 99 mmol/L — AB (ref 101–111)
CO2: 30 mmol/L (ref 22–32)
CREATININE: 1.4 mg/dL — AB (ref 0.61–1.24)
Calcium: 8.8 mg/dL — ABNORMAL LOW (ref 8.9–10.3)
GFR calc Af Amer: 53 mL/min — ABNORMAL LOW (ref >60)
GFR, EST NON AFRICAN AMERICAN: 46 mL/min — AB (ref >60)
Glucose, Bld: 169 mg/dL — ABNORMAL HIGH (ref 65–99)
Potassium: 4.2 mmol/L (ref 3.5–5.1)
Sodium: 141 mmol/L (ref 135–145)
Total Bilirubin: 0.9 mg/dL (ref 0.3–1.2)
Total Protein: 7.9 g/dL (ref 6.5–8.1)

## 2017-12-05 LAB — PROTIME-INR
INR: 0.97
Prothrombin Time: 12.8 seconds (ref 11.4–15.2)

## 2017-12-05 LAB — LACTIC ACID, PLASMA: LACTIC ACID, VENOUS: 1.6 mmol/L (ref 0.5–1.9)

## 2017-12-05 LAB — APTT: APTT: 27 s (ref 24–36)

## 2017-12-05 LAB — BRAIN NATRIURETIC PEPTIDE: B NATRIURETIC PEPTIDE 5: 46 pg/mL (ref 0.0–100.0)

## 2017-12-05 MED ORDER — ALBUTEROL SULFATE (2.5 MG/3ML) 0.083% IN NEBU: 2.5000 mg | INHALATION_SOLUTION | Freq: Four times a day (QID) | RESPIRATORY_TRACT | Status: DC | PRN

## 2017-12-05 MED ORDER — IOPAMIDOL (ISOVUE-370) INJECTION 76%
75.0000 mL | Freq: Once | INTRAVENOUS | Status: AC | PRN
  Administered 2017-12-05: 75 mL via INTRAVENOUS

## 2017-12-05 MED ORDER — METHYLPREDNISOLONE SODIUM SUCC 125 MG IJ SOLR
125.0000 mg | Freq: Once | INTRAMUSCULAR | Status: AC
  Administered 2017-12-05: 125 mg via INTRAVENOUS
  Filled 2017-12-05: qty 2

## 2017-12-05 MED ORDER — METHYLPREDNISOLONE SODIUM SUCC 125 MG IJ SOLR
60.0000 mg | INTRAMUSCULAR | Status: DC
  Administered 2017-12-06 – 2017-12-07 (×2): 60 mg via INTRAVENOUS
  Filled 2017-12-05 (×2): qty 2

## 2017-12-05 MED ORDER — SODIUM CHLORIDE 0.9 % IV BOLUS (SEPSIS)
1500.0000 mL | Freq: Once | INTRAVENOUS | Status: AC
  Administered 2017-12-05: 1500 mL via INTRAVENOUS

## 2017-12-05 MED ORDER — PIPERACILLIN-TAZOBACTAM 3.375 G IVPB 30 MIN
3.3750 g | Freq: Once | INTRAVENOUS | Status: AC
  Administered 2017-12-05: 3.375 g via INTRAVENOUS
  Filled 2017-12-05: qty 50

## 2017-12-05 MED ORDER — ASPIRIN 81 MG PO CHEW
324.0000 mg | CHEWABLE_TABLET | Freq: Once | ORAL | Status: AC
Start: 2017-12-05 — End: 2017-12-05
  Administered 2017-12-05: 324 mg via ORAL
  Filled 2017-12-05: qty 4

## 2017-12-05 MED ORDER — POLYETHYLENE GLYCOL 3350 17 G PO PACK: 17.0000 g | PACK | Freq: Every day | ORAL | Status: DC | PRN

## 2017-12-05 MED ORDER — ACETAMINOPHEN 325 MG PO TABS
650.0000 mg | ORAL_TABLET | Freq: Four times a day (QID) | ORAL | Status: DC | PRN
  Administered 2017-12-08: 650 mg via ORAL
  Filled 2017-12-05: qty 2

## 2017-12-05 MED ORDER — MOMETASONE FURO-FORMOTEROL FUM 100-5 MCG/ACT IN AERO
2.0000 | INHALATION_SPRAY | Freq: Two times a day (BID) | RESPIRATORY_TRACT | Status: DC
  Administered 2017-12-05 – 2017-12-08 (×6): 2 via RESPIRATORY_TRACT
  Filled 2017-12-05: qty 8.8

## 2017-12-05 MED ORDER — ENOXAPARIN SODIUM 40 MG/0.4ML ~~LOC~~ SOLN
40.0000 mg | SUBCUTANEOUS | Status: DC
  Administered 2017-12-05 – 2017-12-07 (×3): 40 mg via SUBCUTANEOUS
  Filled 2017-12-05 (×3): qty 0.4

## 2017-12-05 MED ORDER — SODIUM CHLORIDE 0.9 % IV BOLUS (SEPSIS)
1000.0000 mL | Freq: Once | INTRAVENOUS | Status: AC
  Administered 2017-12-05: 1000 mL via INTRAVENOUS

## 2017-12-05 MED ORDER — VANCOMYCIN HCL 10 G IV SOLR
1750.0000 mg | Freq: Once | INTRAVENOUS | Status: AC
  Administered 2017-12-05: 1750 mg via INTRAVENOUS
  Filled 2017-12-05: qty 1750

## 2017-12-05 MED ORDER — ACETAMINOPHEN 650 MG RE SUPP: 650.0000 mg | Freq: Four times a day (QID) | RECTAL | Status: DC | PRN

## 2017-12-05 MED ORDER — ORAL CARE MOUTH RINSE
15.0000 mL | Freq: Two times a day (BID) | OROMUCOSAL | Status: DC
  Administered 2017-12-05 – 2017-12-07 (×4): 15 mL via OROMUCOSAL

## 2017-12-05 NOTE — ED Notes (Signed)
Pharmacy aware of need for vancomycin

## 2017-12-05 NOTE — Progress Notes (Signed)
CODE SEPSIS - PHARMACY COMMUNICATION  **Broad Spectrum Antibiotics should be administered within 1 hour of Sepsis diagnosis**  Time Code Sepsis Called/Page Received: 1718  Antibiotics Ordered: Zosyn and vancomycin  Time of 1st antibiotic administration: Zosyn 1733  Additional action taken by pharmacy:   If necessary, Name of Provider/Nurse Contacted:     Rocky Morel ,PharmD Clinical Pharmacist  12/05/2017  5:32 PM

## 2017-12-05 NOTE — Progress Notes (Signed)
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 States that Dr. Wolfgang Phoenix is his PCP, she has put him on some medications, but doesn't remember all of them, only Plavix, Lipitor and a BP medication; has a cane and walker at home, but doesn't use them; Dr. Maryan Char at [facility], he's supposed to have another eye surgery on December 29, 2017; and Dr. Doroteo Bradford is his Cardiologist. Only brought clothes with him;Barbaraann Faster, RN 10:58 PM 12/05/2017

## 2017-12-05 NOTE — ED Notes (Signed)
CODE  SEPSIS  CALLED TO  Brett [name]  AT  [facility]

## 2017-12-05 NOTE — ED Triage Notes (Signed)
Pt brought in via ems from home with sob.  Sx for 2 weeks, worse past 2 days.  recent pneumonia.  Pt on 4 liters oxygen at home.  Pt alert.  Iv in place.  125mg  solumedrol and alb tx given by ems.

## 2017-12-05 NOTE — ED Notes (Signed)
Date and time results received: 12/05/17 1747 (use smartphrase ".now" to insert current time)  Test: troponin  Critical Value: 0.04  Name of Provider Notified: norman

## 2017-12-05 NOTE — H&P (Signed)
[place] at [place] NAME: Brett [name]    MR#:  [id number]  DATE OF BIRTH:  June 05, 1935  DATE OF ADMISSION:  12/05/2017  PRIMARY CARE PHYSICIAN: Patient, No Pcp Per   REQUESTING/REFERRING PHYSICIAN: Dr. Mariea Clonts  Brett COMPLAINT:  Increasing shortness of breath  HISTORY OF PRESENT ILLNESS:  Brett [name]  is a 83 y.o. male with a known history of PD on 2 L nasal cannula oxygen,  eye decreased vision hypertension, impaired hearing comes to the emergency room with increasing shortness of breath.  She reports he was at [facility] about 3 weeks ago treated for possible pneumonia and COPD.  I have no records of it in the computer.  He is very hard on hearing not able to give much history.  No family members present.  Patient was ambulated with his oxygen he was extremely short of breath wheezing and now is being admitted with acute on chronic COPD exacerbation  He received breathing treatment and dose of Solu-Medrol.  During my examination patient was not wheezing.  Chest x-ray no pneumonia.   PAST MEDICAL HISTORY:   Past Medical History:  Diagnosis Date  . Hypertension     PAST SURGICAL HISTOIRY:  Patient had some eye surgery in the left eye SOCIAL HISTORY:   Social History   Tobacco Use  . Smoking status: Former Research scientist (life sciences)  . Smokeless tobacco: Never Used  Substance Use Topics  . Alcohol use: No    Frequency: Never    FAMILY HISTORY:  No family history on file.  DRUG ALLERGIES:  No Known Allergies  REVIEW OF SYSTEMS:  Review of Systems  Constitutional: Negative for chills, fever and weight loss.  HENT: Negative for ear discharge, ear pain and nosebleeds.   Eyes: Negative for blurred vision, pain and discharge.  Respiratory: Positive for shortness of breath. Negative for sputum production, wheezing and stridor.   Cardiovascular: Negative for chest pain, palpitations, orthopnea and PND.  Gastrointestinal:  Negative for abdominal pain, diarrhea, nausea and vomiting.  Genitourinary: Negative for frequency and urgency.  Musculoskeletal: Negative for back pain and joint pain.  Neurological: Positive for weakness. Negative for sensory change, speech change and focal weakness. Loss of consciousness:    Psychiatric/Behavioral: Negative for depression and hallucinations. The patient is not nervous/anxious.      MEDICATIONS AT HOME:   Prior to Admission medications   Not on File      VITAL SIGNS:  Blood pressure (!) 143/79, pulse (!) 107, temperature 98.6 F (37 C), temperature source Oral, resp. rate (!) 24, height 5\' 10"  (1.778 m), weight 85.7 kg (189 lb), SpO2 92 %.  PHYSICAL EXAMINATION:  GENERAL:  83 y.o.-year-old patient lying in the bed with no acute distress.  EYES: Pupils equal, round, reactive to light and accommodation. No scleral icterus. Extraocular muscles intact.  Left eye legally blind HEENT: Head atraumatic, normocephalic. Oropharynx and nasopharynx clear.  NECK:  Supple, no jugular venous distention. No thyroid enlargement, no tenderness.  LUNGS: Normal breath sounds bilaterally, no wheezing, rales,rhonchi or crepitation. No use of accessory muscles of respiration.  CARDIOVASCULAR: S1, S2 normal. No murmurs, rubs, or gallops.  ABDOMEN: Soft, nontender, nondistended. Bowel sounds present. No organomegaly or mass.  EXTREMITIES: No pedal edema, cyanosis, or clubbing.  NEUROLOGIC: Cranial nerves II through XII are intact. Muscle strength 5/5 in all extremities. Sensation intact. Gait not checked.  PSYCHIATRIC: The patient is alert and oriented x 3.  SKIN: No obvious rash, lesion,  or ulcer.   LABORATORY PANEL:   CBC Recent Labs  Lab 12/05/17 1646  WBC 8.3  HGB 11.5*  HCT 35.0*  PLT 240   ------------------------------------------------------------------------------------------------------------------  Chemistries  Recent Labs  Lab 12/05/17 1646  NA 141  K 4.2  CL  99*  CO2 30  GLUCOSE 169*  BUN 28*  CREATININE 1.40*  CALCIUM 8.8*  AST 17  ALT 14*  ALKPHOS 88  BILITOT 0.9   ------------------------------------------------------------------------------------------------------------------  Cardiac Enzymes Recent Labs  Lab 12/05/17 1646  TROPONINI 0.04*   ------------------------------------------------------------------------------------------------------------------  RADIOLOGY:  Dg Chest 2 View  Result Date: 12/05/2017 CLINICAL DATA:  83 year old male with progressive shortness of breath for 2 weeks. Former smoker. EXAM: CHEST  2 VIEW COMPARISON:  None. FINDINGS: Mild cardiomegaly. Other mediastinal contours are within normal limits. Visualized tracheal air column is within normal limits. No pneumothorax or pulmonary edema. Possible small bilateral pleural effusions. No consolidation. No confluent pulmonary opacity. Basilar predominant bilateral increased pulmonary interstitial markings appear likely chronic. No acute osseous abnormality identified. Negative visible bowel gas pattern. IMPRESSION: Possible small pleural effusions, but no other acute cardiopulmonary abnormality identified, as chronic pulmonary interstitial changes are suspected. Electronically Signed   By: Genevie Ann M.D.   On: 12/05/2017 18:20   Ct Angio Chest Pe W And/or Wo Contrast  Result Date: 12/05/2017 CLINICAL DATA:  Dyspnea, cough and hypoxia. Recently treated for pneumonia. EXAM: CT ANGIOGRAPHY CHEST WITH CONTRAST TECHNIQUE: Multidetector CT imaging of the chest was performed using the standard protocol during bolus administration of intravenous contrast. Multiplanar CT image reconstructions and MIPs were obtained to evaluate the vascular anatomy. CONTRAST:  65mL ISOVUE-370 IOPAMIDOL (ISOVUE-370) INJECTION 76% COMPARISON:  12/05/2017 CXR FINDINGS: Cardiovascular: Cardiomegaly without pericardial effusion. No acute pulmonary embolus to the segmental level. Good opacification of  pulmonary arterial system. Moderate aortic atherosclerosis without aneurysm or dissection. Left main and three-vessel coronary arteriosclerosis is noted. Mediastinum/Nodes: Subcentimeter right upper and left lower paratracheal lymph nodes without adenopathy. Patent trachea and mainstem bronchi. Esophagus is unremarkable. Lungs/Pleura: Small left effusion. Centrilobular emphysema, upper lobe predominant. Four through 6 mm subpleural nodular densities are identified within the right lower lobe possibly postinfectious or postinflammatory. A follow-up is recommended as below names neoplastic nodules are not entirely excluded. Tiny tree-in-bud densities are seen in the posterior aspect of the right upper lobe likely representing stigmata bronchiolitis. No pneumonic consolidation is identified. There is no pneumothorax. Mild peribronchial thickening is seen within the lower lobes. Pleural-based density in the lingula is identified likely representing focus of atelectasis. Attention to this area on follow-up is suggested. Upper Abdomen: Mild hepatic steatosis.  No acute abnormality. Musculoskeletal: No acute nor suspicious osseous abnormalities. Degenerative change is noted along the dorsal spine. Review of the MIP images confirms the above findings. IMPRESSION: 1. Cardiomegaly with left main and three-vessel coronary arteriosclerosis. 2. No acute pulmonary embolus. 3. Upper lobe predominant centrilobular emphysema. 4. Tiny posterior segment right upper lobe tree-in-bud densities likely reflecting stigmata of bronchitis. 5. Subpleural nodular opacities are noted ranging in size 4-6 mm. These may be postinfectious or postinflammatory in etiology. Non-contrast chest CT at 3-6 months is recommended for further assessment. If the nodules are stable at time of repeat CT, then future CT at 18-24 months (from today's scan) is considered optional for low-risk patients, but is recommended for high-risk patients. This recommendation  follows the consensus statement: Guidelines for Management of Incidental Pulmonary Nodules Detected on CT Images: From the Fleischner Society 2017; Radiology 2017; 284:228-243. 6. Small left  effusion with adjacent atelectasis. 7. Aortic atherosclerosis. Aortic Atherosclerosis (ICD10-I70.0) and Emphysema (ICD10-J43.9). Electronically Signed   By: Ashley Royalty M.D.   On: 12/05/2017 19:09    EKG:    IMPRESSION AND PLAN:   Brett [name]  is a 83 y.o. male with a known history of PD on 2 L nasal cannula oxygen,  eye decreased vision hypertension, impaired hearing comes to the emergency room with increasing shortness of breath.  1.  Acute on chronic COPD exacerbation -Patient sats remain above 90 on 2 L nasal cannula oxygen however after ambulation he got very short of breath with some wheezing -IV Solu-Medrol, nebulizer, inhalers -Chest x-ray no evidence of pneumonia I will hold off on antibiotic -Incentive spirometer  2.  Hypertension -New home meds once pharmacy tech is able to reconcile  3.  DVT prophylaxis subcu Lovenox  Unfortunately I do not have any other history from patient or past medical history.  He is very hard on hearing and unable to discuss with family member at this time.  We will have CM look into discharge planning    All the records are reviewed and case discussed with ED provider. Management plans discussed with the patient, family and they are in agreement.  CODE STATUS: Full  TOTAL TIME TAKING CARE OF THIS PATIENT: *50* minutes.    Fritzi Mandes M.D on 12/05/2017 at 8:26 PM  Between 7am to 6pm - Pager - [phone]  After 6pm go to [email or web address] - password [email or web address] [place] [facility]  Office  [phone]  CC: Primary care physician; Patient, No Pcp Per

## 2017-12-05 NOTE — ED Notes (Signed)
Patient transported to X-ray 

## 2017-12-05 NOTE — ED Notes (Signed)
Pt ambulated in hallway with this RN's assistance. Pt was on 2L of oxygen at 93% to start, during ambulation pt's O2 dropped to 85% on 2L, pt's oxygen turned up to 4L however pt's oxygen level continued to stay in the mid 80's. Pt was able to ambulate back to his rm and into the bed where he remained on 4L and oxygen rose to 92%. Pt continues to rest in the bed on 4L, respiratory rate has decreased from 34 resp to 25 at this time.

## 2017-12-05 NOTE — ED Provider Notes (Addendum)
[facility] Emergency Department Provider Note  ____________________________________________  Time seen: Approximately 5:16 PM  I have reviewed the triage vital signs and the nursing notes.   HISTORY  Chief Complaint Shortness of Breath    HPI Brett [name] is a 83 y.o. male with a history of COPD on 3 L nasal cannula, recent admission for pneumonia, presenting with shortness of breath.  The patient reports he was admitted to the hospital 3 weeks ago and discharge with steroids and albuterol, but does not remember taking any antibiotics.  Over the past several days, his cough has worsened and he has become more short of breath, despite his exogenous oxygen.  He denies any chest pain, fever or chills, nausea vomiting or diarrhea.   Past Medical History:  Diagnosis Date  . Hypertension     There are no active problems to display for this patient.       Allergies Patient has no known allergies.  No family history on file.  Social History Social History   Tobacco Use  . Smoking status: Former Research scientist (life sciences)  . Smokeless tobacco: Never Used  Substance Use Topics  . Alcohol use: No    Frequency: Never  . Drug use: No    Review of Systems Constitutional: No fever/chills.  Some general malaise. Eyes: No visual changes.  No eye discharge. ENT: No sore throat.  Positive congestion with clear rhinorrhea.. Cardiovascular: Denies chest pain. Denies palpitations. Respiratory: Positive shortness of breath.  Positive cough. Gastrointestinal: No abdominal pain.  No nausea, no vomiting.  No diarrhea.  No constipation. Genitourinary: Negative for dysuria. Musculoskeletal: Negative for back pain. Skin: Negative for rash. Neurological: Negative for headaches. No focal numbness, tingling or weakness.     ____________________________________________   PHYSICAL EXAM:  VITAL SIGNS: ED Triage Vitals  Enc Vitals Group     BP 12/05/17 1645 (!) 145/90      Pulse Rate 12/05/17 1645 (!) 121     Resp 12/05/17 1645 20     Temp 12/05/17 1645 98.6 F (37 C)     Temp Source 12/05/17 1645 Oral     SpO2 12/05/17 1645 93 %     Weight 12/05/17 1642 189 lb (85.7 kg)     Height 12/05/17 1642 5\' 10"  (1.778 m)     Head Circumference --      Peak Flow --      Pain Score --      Pain Loc --      Pain Edu? --      Excl. in [place]? --     Constitutional: Alert and oriented.  Chronically ill appearing and in no acute distress. Answers questions appropriately, although the patient is extremely hard of hearing, and does not hear some of my questions despite multiple attempts. Eyes: Conjunctivae are normal.  EOMI. No scleral icterus.  No eye discharge. Head: Atraumatic. Nose: No congestion/rhinnorhea. Mouth/Throat: Mucous membranes are mildly dry.  Neck: No stridor.  Supple.  No JVD.  No meningismus. Cardiovascular: Normal rate, regular rhythm. No murmurs, rubs or gallops.  Respiratory: The patient has mild tachypnea without accessory muscle use or retractions.  He has rales in the bases bilaterally, right greater than left.  He has no wheezing.  He is able to speak in 4-5 word sentences without any difficulty. Gastrointestinal: Soft, nontender and nondistended.  No guarding or rebound.  No peritoneal signs. Musculoskeletal: No LE edema. No ttp in the calves or palpable cords.  Negative Homan's sign. Neurologic:  A&Ox3.  Speech is clear.  Face and smile are symmetric.  EOMI.  Moves all extremities well. Skin:  Skin is warm, dry and intact. No rash noted. Psychiatric: Mood and affect are normal. Speech and behavior are normal.  Normal judgement.  ____________________________________________   LABS (all labs ordered are listed, but only abnormal results are displayed)  Labs Reviewed  COMPREHENSIVE METABOLIC PANEL - Abnormal; Notable for the following components:      Result Value   Chloride 99 (*)    Glucose, Bld 169 (*)    BUN 28 (*)    Creatinine, Ser  1.40 (*)    Calcium 8.8 (*)    ALT 14 (*)    GFR calc non Af Amer 46 (*)    GFR calc Af Amer 53 (*)    All other components within normal limits  CBC WITH DIFFERENTIAL/PLATELET - Abnormal; Notable for the following components:   RBC 4.14 (*)    Hemoglobin 11.5 (*)    HCT 35.0 (*)    RDW 17.8 (*)    All other components within normal limits  URINALYSIS, ROUTINE W REFLEX MICROSCOPIC - Abnormal; Notable for the following components:   Color, Urine AMBER (*)    APPearance HAZY (*)    Glucose, UA 50 (*)    Hgb urine dipstick SMALL (*)    Protein, ur 100 (*)    Bacteria, UA FEW (*)    Squamous Epithelial / LPF 0-5 (*)    All other components within normal limits  TROPONIN I - Abnormal; Notable for the following components:   Troponin I 0.04 (*)    All other components within normal limits  BLOOD GAS, VENOUS - Abnormal; Notable for the following components:   pCO2, Ven 66 (*)    Bicarbonate 36.4 (*)    Acid-Base Excess 8.7 (*)    All other components within normal limits  CULTURE, BLOOD (ROUTINE X 2)  CULTURE, BLOOD (ROUTINE X 2)  URINE CULTURE  LACTIC ACID, PLASMA  BRAIN NATRIURETIC PEPTIDE  INFLUENZA PANEL BY PCR (TYPE A & B)  PROTIME-INR  APTT   ____________________________________________  EKG  ED ECG REPORT I, Eula Listen, the attending physician, personally viewed and interpreted this ECG.   Date: 12/05/2017  EKG Time: 1737  Rate: 99  Rhythm: normal sinus rhythm; RSR'  Axis: normal  Intervals:first-degree A-V block   ST&T Change: No STEMI  ____________________________________________  RADIOLOGY  Dg Chest 2 View  Result Date: 12/05/2017 CLINICAL DATA:  83 year old male with progressive shortness of breath for 2 weeks. Former smoker. EXAM: CHEST  2 VIEW COMPARISON:  None. FINDINGS: Mild cardiomegaly. Other mediastinal contours are within normal limits. Visualized tracheal air column is within normal limits. No pneumothorax or pulmonary edema. Possible  small bilateral pleural effusions. No consolidation. No confluent pulmonary opacity. Basilar predominant bilateral increased pulmonary interstitial markings appear likely chronic. No acute osseous abnormality identified. Negative visible bowel gas pattern. IMPRESSION: Possible small pleural effusions, but no other acute cardiopulmonary abnormality identified, as chronic pulmonary interstitial changes are suspected. Electronically Signed   By: Genevie Ann M.D.   On: 12/05/2017 18:20   Ct Angio Chest Pe W And/or Wo Contrast  Result Date: 12/05/2017 CLINICAL DATA:  Dyspnea, cough and hypoxia. Recently treated for pneumonia. EXAM: CT ANGIOGRAPHY CHEST WITH CONTRAST TECHNIQUE: Multidetector CT imaging of the chest was performed using the standard protocol during bolus administration of intravenous contrast. Multiplanar CT image reconstructions and MIPs were obtained to evaluate the vascular anatomy. CONTRAST:  73mL ISOVUE-370 IOPAMIDOL (ISOVUE-370) INJECTION 76% COMPARISON:  12/05/2017 CXR FINDINGS: Cardiovascular: Cardiomegaly without pericardial effusion. No acute pulmonary embolus to the segmental level. Good opacification of pulmonary arterial system. Moderate aortic atherosclerosis without aneurysm or dissection. Left main and three-vessel coronary arteriosclerosis is noted. Mediastinum/Nodes: Subcentimeter right upper and left lower paratracheal lymph nodes without adenopathy. Patent trachea and mainstem bronchi. Esophagus is unremarkable. Lungs/Pleura: Small left effusion. Centrilobular emphysema, upper lobe predominant. Four through 6 mm subpleural nodular densities are identified within the right lower lobe possibly postinfectious or postinflammatory. A follow-up is recommended as below names neoplastic nodules are not entirely excluded. Tiny tree-in-bud densities are seen in the posterior aspect of the right upper lobe likely representing stigmata bronchiolitis. No pneumonic consolidation is identified. There is  no pneumothorax. Mild peribronchial thickening is seen within the lower lobes. Pleural-based density in the lingula is identified likely representing focus of atelectasis. Attention to this area on follow-up is suggested. Upper Abdomen: Mild hepatic steatosis.  No acute abnormality. Musculoskeletal: No acute nor suspicious osseous abnormalities. Degenerative change is noted along the dorsal spine. Review of the MIP images confirms the above findings. IMPRESSION: 1. Cardiomegaly with left main and three-vessel coronary arteriosclerosis. 2. No acute pulmonary embolus. 3. Upper lobe predominant centrilobular emphysema. 4. Tiny posterior segment right upper lobe tree-in-bud densities likely reflecting stigmata of bronchitis. 5. Subpleural nodular opacities are noted ranging in size 4-6 mm. These may be postinfectious or postinflammatory in etiology. Non-contrast chest CT at 3-6 months is recommended for further assessment. If the nodules are stable at time of repeat CT, then future CT at 18-24 months (from today's scan) is considered optional for low-risk patients, but is recommended for high-risk patients. This recommendation follows the consensus statement: Guidelines for Management of Incidental Pulmonary Nodules Detected on CT Images: From the Fleischner Society 2017; Radiology 2017; 284:228-243. 6. Small left effusion with adjacent atelectasis. 7. Aortic atherosclerosis. Aortic Atherosclerosis (ICD10-I70.0) and Emphysema (ICD10-J43.9). Electronically Signed   By: Ashley Royalty M.D.   On: 12/05/2017 19:09    ____________________________________________   PROCEDURES  Procedure(s) performed: None  Procedures  Critical Care performed: Yes, see critical care note(s) ____________________________________________   INITIAL IMPRESSION / ASSESSMENT AND PLAN / ED COURSE  Pertinent labs & imaging results that were available during my care of the patient were reviewed by me and considered in my medical decision  making (see chart for details).  83 y.o. male chronically on 3 L of oxygen for COPD, recent pneumonia hospitalization, presenting with worsening shortness of breath, cough.  On arrival to the emergency department the patient is tachypneic and has some mildly dry mucous membranes.  I am concerned that his tachypnea may be from sepsis or some dehydration, or a combination of factors.  The most likely etiology of the patient's symptoms is recurrent pneumonia, now hospital-acquired, but I would also consider PE although this is less likely.  Acute COPD exacerbation is also possible.  We will check the patient for fluid as well.  Empiric antibiotics have been initiated, and the patient will receive intravenous fluids.  The patient will require admission to the hospital for further evaluation and treatment.  ----------------------------------------- 6:18 PM on 12/05/2017 -----------------------------------------  The patient has had an improvement in his heart rate from the 120s-104.  He has remained afebrile and has a normal white blood cell count.  His blood gas shows normal pH with some hypercarbia.  I am awaiting the results of his chest x-ray.  His urine has a few bacteria  without any other significant evidence of UTI.  He does have proteinuria and glucosuria in his urine.  He also has renal insufficiency today.  The patient has an elevated troponin, which may be due to cardiac strain rather than ACS or MI; this will need to be trended.  An aspirin has been ordered.  ----------------------------------------- 7:23 PM on 12/05/2017 -----------------------------------------  I reevaluated the patient who continues to have a low-grade tachycardia in the low 100s.  His oxygen saturations are normal at 2 L nasal cannula and will do an ambulatory pulse oximetry reading at this time.  His influenza testing is negative.  His BNP is within normal limits.  His CT scan is negative for PE or acute infectious  process.  There are some nodules that are likely post infectious.  It is possible the patient has an isolated COPD exacerbation, and I will treat him with Solu-Medrol.  If the patient's oxygen saturation drops on his regular oxygen, I will plan to admit him for further evaluation and treatment.  ----------------------------------------- 8:17 PM on 12/05/2017 -----------------------------------------  The patient was ambulated and had a significant bump in his heart rate to the 130s, with hypoxia to the mid 80s.  The patient has been admitted.  CRITICAL CARE Performed by: Eula Listen   Total critical care time: 40 minutes  Critical care time was exclusive of separately billable procedures and treating other patients.  Critical care was necessary to treat or prevent imminent or life-threatening deterioration.  Critical care was time spent personally by me on the following activities: development of treatment plan with patient and/or surrogate as well as nursing, discussions with consultants, evaluation of patient's response to treatment, examination of patient, obtaining history from patient or surrogate, ordering and performing treatments and interventions, ordering and review of laboratory studies, ordering and review of radiographic studies, pulse oximetry and re-evaluation of patient's condition.   ____________________________________________  FINAL CLINICAL IMPRESSION(S) / ED DIAGNOSES  Final diagnoses:  Elevated troponin  Sinus tachycardia  Hypercarbia         NEW MEDICATIONS STARTED DURING THIS VISIT:  This SmartLink is deprecated. Use AVSMEDLIST instead to display the medication list for a patient.    Eula Listen, MD 12/05/17 2017    Eula Listen, MD 12/05/17 2018

## 2017-12-06 DIAGNOSIS — I2510 Atherosclerotic heart disease of native coronary artery without angina pectoris: Secondary | ICD-10-CM | POA: Diagnosis present

## 2017-12-06 DIAGNOSIS — Z9221 Personal history of antineoplastic chemotherapy: Secondary | ICD-10-CM | POA: Diagnosis not present

## 2017-12-06 DIAGNOSIS — Z9981 Dependence on supplemental oxygen: Secondary | ICD-10-CM | POA: Diagnosis not present

## 2017-12-06 DIAGNOSIS — Z8572 Personal history of non-Hodgkin lymphomas: Secondary | ICD-10-CM | POA: Diagnosis not present

## 2017-12-06 DIAGNOSIS — J9621 Acute and chronic respiratory failure with hypoxia: Secondary | ICD-10-CM | POA: Diagnosis present

## 2017-12-06 DIAGNOSIS — D509 Iron deficiency anemia, unspecified: Secondary | ICD-10-CM | POA: Diagnosis present

## 2017-12-06 DIAGNOSIS — I129 Hypertensive chronic kidney disease with stage 1 through stage 4 chronic kidney disease, or unspecified chronic kidney disease: Secondary | ICD-10-CM | POA: Diagnosis present

## 2017-12-06 DIAGNOSIS — E1151 Type 2 diabetes mellitus with diabetic peripheral angiopathy without gangrene: Secondary | ICD-10-CM | POA: Diagnosis present

## 2017-12-06 DIAGNOSIS — R000 Tachycardia, unspecified: Secondary | ICD-10-CM | POA: Diagnosis present

## 2017-12-06 DIAGNOSIS — J441 Chronic obstructive pulmonary disease with (acute) exacerbation: Secondary | ICD-10-CM | POA: Diagnosis present

## 2017-12-06 DIAGNOSIS — Z8711 Personal history of peptic ulcer disease: Secondary | ICD-10-CM | POA: Diagnosis not present

## 2017-12-06 DIAGNOSIS — J9622 Acute and chronic respiratory failure with hypercapnia: Secondary | ICD-10-CM | POA: Diagnosis present

## 2017-12-06 DIAGNOSIS — E1122 Type 2 diabetes mellitus with diabetic chronic kidney disease: Secondary | ICD-10-CM | POA: Diagnosis present

## 2017-12-06 DIAGNOSIS — I080 Rheumatic disorders of both mitral and aortic valves: Secondary | ICD-10-CM | POA: Diagnosis present

## 2017-12-06 DIAGNOSIS — Z8701 Personal history of pneumonia (recurrent): Secondary | ICD-10-CM | POA: Diagnosis not present

## 2017-12-06 DIAGNOSIS — N182 Chronic kidney disease, stage 2 (mild): Secondary | ICD-10-CM | POA: Diagnosis present

## 2017-12-06 DIAGNOSIS — Z87891 Personal history of nicotine dependence: Secondary | ICD-10-CM | POA: Diagnosis not present

## 2017-12-06 DIAGNOSIS — R748 Abnormal levels of other serum enzymes: Secondary | ICD-10-CM | POA: Diagnosis present

## 2017-12-06 DIAGNOSIS — N179 Acute kidney failure, unspecified: Secondary | ICD-10-CM | POA: Diagnosis present

## 2017-12-06 DIAGNOSIS — H9190 Unspecified hearing loss, unspecified ear: Secondary | ICD-10-CM | POA: Diagnosis present

## 2017-12-06 LAB — GLUCOSE, CAPILLARY: Glucose-Capillary: 251 mg/dL — ABNORMAL HIGH (ref 65–99)

## 2017-12-06 MED ORDER — AMLODIPINE BESYLATE 5 MG PO TABS
5.0000 mg | ORAL_TABLET | Freq: Every day | ORAL | Status: DC
  Administered 2017-12-06: 5 mg via ORAL
  Filled 2017-12-06: qty 1

## 2017-12-06 MED ORDER — INSULIN ASPART 100 UNIT/ML ~~LOC~~ SOLN
0.0000 [IU] | Freq: Every day | SUBCUTANEOUS | Status: DC
  Administered 2017-12-06: 3 [IU] via SUBCUTANEOUS
  Filled 2017-12-06: qty 1

## 2017-12-06 MED ORDER — PANTOPRAZOLE SODIUM 40 MG PO TBEC
40.0000 mg | DELAYED_RELEASE_TABLET | Freq: Two times a day (BID) | ORAL | Status: DC
  Administered 2017-12-06 – 2017-12-07 (×3): 40 mg via ORAL
  Filled 2017-12-06 (×3): qty 1

## 2017-12-06 MED ORDER — GLIMEPIRIDE 1 MG PO TABS
1.0000 mg | ORAL_TABLET | Freq: Two times a day (BID) | ORAL | Status: DC
  Administered 2017-12-06 – 2017-12-07 (×3): 1 mg via ORAL
  Filled 2017-12-06 (×4): qty 1

## 2017-12-06 MED ORDER — ISOSORBIDE MONONITRATE ER 30 MG PO TB24
30.0000 mg | ORAL_TABLET | Freq: Every day | ORAL | Status: DC
  Administered 2017-12-06: 30 mg via ORAL
  Filled 2017-12-06: qty 1

## 2017-12-06 MED ORDER — LISINOPRIL 10 MG PO TABS
5.0000 mg | ORAL_TABLET | Freq: Every day | ORAL | Status: DC
  Administered 2017-12-07: 5 mg via ORAL
  Filled 2017-12-06: qty 1

## 2017-12-06 MED ORDER — GABAPENTIN 100 MG PO CAPS
100.0000 mg | ORAL_CAPSULE | Freq: Two times a day (BID) | ORAL | Status: DC
  Administered 2017-12-06 – 2017-12-07 (×3): 100 mg via ORAL
  Filled 2017-12-06 (×3): qty 1

## 2017-12-06 MED ORDER — CLOPIDOGREL BISULFATE 75 MG PO TABS
75.0000 mg | ORAL_TABLET | Freq: Every day | ORAL | Status: DC
  Administered 2017-12-06 – 2017-12-07 (×2): 75 mg via ORAL
  Filled 2017-12-06 (×2): qty 1

## 2017-12-06 MED ORDER — ATORVASTATIN CALCIUM 20 MG PO TABS
80.0000 mg | ORAL_TABLET | Freq: Every day | ORAL | Status: DC
  Administered 2017-12-06 – 2017-12-07 (×2): 80 mg via ORAL
  Filled 2017-12-06 (×2): qty 4

## 2017-12-06 MED ORDER — INSULIN ASPART 100 UNIT/ML ~~LOC~~ SOLN
0.0000 [IU] | Freq: Three times a day (TID) | SUBCUTANEOUS | Status: DC
  Administered 2017-12-07: 4 [IU] via SUBCUTANEOUS
  Administered 2017-12-07 – 2017-12-08 (×3): 2 [IU] via SUBCUTANEOUS
  Filled 2017-12-06 (×4): qty 1

## 2017-12-06 NOTE — Progress Notes (Signed)
[facility] [place] NAME: Brett [name]    MR#:  [id number]  DATE OF BIRTH:  24-Feb-1935  SUBJECTIVE:  CHIEF COMPLAINT:   Chief Complaint  Patient presents with  . Shortness of Breath  still SOB, concerned about premature D/C till he feels better, hard of hearing REVIEW OF SYSTEMS:  Review of Systems  Constitutional: Negative for chills, fever and weight loss.  HENT: Positive for hearing loss. Negative for nosebleeds and sore throat.   Eyes: Negative for blurred vision.  Respiratory: Positive for cough and shortness of breath. Negative for wheezing.   Cardiovascular: Negative for chest pain, orthopnea, leg swelling and PND.  Gastrointestinal: Negative for abdominal pain, constipation, diarrhea, heartburn, nausea and vomiting.  Genitourinary: Negative for dysuria and urgency.  Musculoskeletal: Negative for back pain.  Skin: Negative for rash.  Neurological: Negative for dizziness, speech change, focal weakness and headaches.  Endo/Heme/Allergies: Does not bruise/bleed easily.  Psychiatric/Behavioral: Negative for depression.    DRUG ALLERGIES:  No Known Allergies VITALS:  Blood pressure 140/66, pulse (!) 108, temperature 97.8 F (36.6 C), temperature source Oral, resp. rate (!) 24, height 5\' 10"  (1.778 m), weight 84.1 kg (185 lb 4.8 oz), SpO2 94 %. PHYSICAL EXAMINATION:  Physical Exam  Constitutional: He is oriented to person, place, and time and well-developed, well-nourished, and in no distress.  HENT:  Head: Normocephalic and atraumatic.  Eyes: Conjunctivae and EOM are normal. Pupils are equal, round, and reactive to light.  Neck: Normal range of motion. Neck supple. No tracheal deviation present. No thyromegaly present.  Cardiovascular: Normal rate, regular rhythm and normal heart sounds.  Pulmonary/Chest: Effort normal and breath sounds normal. No respiratory distress. He has no wheezes. He exhibits no tenderness.    Abdominal: Soft. Bowel sounds are normal. He exhibits no distension. There is no tenderness.  Musculoskeletal: Normal range of motion.  Neurological: He is alert and oriented to person, place, and time. No cranial nerve deficit.  Skin: Skin is warm and dry. No rash noted.  Psychiatric: Mood and affect normal.   LABORATORY PANEL:  Male CBC Recent Labs  Lab 12/05/17 1646  WBC 8.3  HGB 11.5*  HCT 35.0*  PLT 240   ------------------------------------------------------------------------------------------------------------------ Chemistries  Recent Labs  Lab 12/05/17 1646  NA 141  K 4.2  CL 99*  CO2 30  GLUCOSE 169*  BUN 28*  CREATININE 1.40*  CALCIUM 8.8*  AST 17  ALT 14*  ALKPHOS 88  BILITOT 0.9   RADIOLOGY:  Dg Chest 2 View  Result Date: 12/05/2017 CLINICAL DATA:  83 year old male with progressive shortness of breath for 2 weeks. Former smoker. EXAM: CHEST  2 VIEW COMPARISON:  None. FINDINGS: Mild cardiomegaly. Other mediastinal contours are within normal limits. Visualized tracheal air column is within normal limits. No pneumothorax or pulmonary edema. Possible small bilateral pleural effusions. No consolidation. No confluent pulmonary opacity. Basilar predominant bilateral increased pulmonary interstitial markings appear likely chronic. No acute osseous abnormality identified. Negative visible bowel gas pattern. IMPRESSION: Possible small pleural effusions, but no other acute cardiopulmonary abnormality identified, as chronic pulmonary interstitial changes are suspected. Electronically Signed   By: Genevie Ann M.D.   On: 12/05/2017 18:20   Ct Angio Chest Pe W And/or Wo Contrast  Result Date: 12/05/2017 CLINICAL DATA:  Dyspnea, cough and hypoxia. Recently treated for pneumonia. EXAM: CT ANGIOGRAPHY CHEST WITH CONTRAST TECHNIQUE: Multidetector CT imaging of the chest was performed using the standard protocol during bolus administration of intravenous  contrast. Multiplanar CT image  reconstructions and MIPs were obtained to evaluate the vascular anatomy. CONTRAST:  74mL ISOVUE-370 IOPAMIDOL (ISOVUE-370) INJECTION 76% COMPARISON:  12/05/2017 CXR FINDINGS: Cardiovascular: Cardiomegaly without pericardial effusion. No acute pulmonary embolus to the segmental level. Good opacification of pulmonary arterial system. Moderate aortic atherosclerosis without aneurysm or dissection. Left main and three-vessel coronary arteriosclerosis is noted. Mediastinum/Nodes: Subcentimeter right upper and left lower paratracheal lymph nodes without adenopathy. Patent trachea and mainstem bronchi. Esophagus is unremarkable. Lungs/Pleura: Small left effusion. Centrilobular emphysema, upper lobe predominant. Four through 6 mm subpleural nodular densities are identified within the right lower lobe possibly postinfectious or postinflammatory. A follow-up is recommended as below names neoplastic nodules are not entirely excluded. Tiny tree-in-bud densities are seen in the posterior aspect of the right upper lobe likely representing stigmata bronchiolitis. No pneumonic consolidation is identified. There is no pneumothorax. Mild peribronchial thickening is seen within the lower lobes. Pleural-based density in the lingula is identified likely representing focus of atelectasis. Attention to this area on follow-up is suggested. Upper Abdomen: Mild hepatic steatosis.  No acute abnormality. Musculoskeletal: No acute nor suspicious osseous abnormalities. Degenerative change is noted along the dorsal spine. Review of the MIP images confirms the above findings. IMPRESSION: 1. Cardiomegaly with left main and three-vessel coronary arteriosclerosis. 2. No acute pulmonary embolus. 3. Upper lobe predominant centrilobular emphysema. 4. Tiny posterior segment right upper lobe tree-in-bud densities likely reflecting stigmata of bronchitis. 5. Subpleural nodular opacities are noted ranging in size 4-6 mm. These may be postinfectious or  postinflammatory in etiology. Non-contrast chest CT at 3-6 months is recommended for further assessment. If the nodules are stable at time of repeat CT, then future CT at 18-24 months (from today's scan) is considered optional for low-risk patients, but is recommended for high-risk patients. This recommendation follows the consensus statement: Guidelines for Management of Incidental Pulmonary Nodules Detected on CT Images: From the Fleischner Society 2017; Radiology 2017; 284:228-243. 6. Small left effusion with adjacent atelectasis. 7. Aortic atherosclerosis. Aortic Atherosclerosis (ICD10-I70.0) and Emphysema (ICD10-J43.9). Electronically Signed   By: Ashley Royalty M.D.   On: 12/05/2017 19:09   ASSESSMENT AND PLAN:   Ivy Meriwether  is a 83 y.o. male with a known history of PD on 2 L nasal cannula oxygen, decreased vision hypertension, impaired hearing admitted with increasing shortness of breath.  1.  Acute on chronic COPD exacerbation - continue IV Solu-Medrol, nebulizer, inhalers -Chest x-ray no evidence of pneumonia - hold off on antibiotic -Incentive spirometer  2.  Hypertension -New home meds once pharmacy tech is able to reconcile  3. ARF - monitor, likely prerenal  4. Sinus tach - likely due to breathing issues     All the records are reviewed and case discussed with Care Management/Social Worker. Management plans discussed with the patient, nursing and they are in agreement.  CODE STATUS: Full Code  TOTAL TIME TAKING CARE OF THIS PATIENT: 25 minutes.   More than 50% of the time was spent in counseling/coordination of care: YES  POSSIBLE D/C IN 1-2 DAYS, DEPENDING ON CLINICAL CONDITION.   Brett [name] M.D on 12/06/2017 at 5:24 PM  Between 7am to 6pm - Pager - [phone]  After 6pm go to [email or web address] - Proofreader  [facility]  Office  [phone]  CC: Primary care physician; Perrin Maltese, MD  Note: This dictation was prepared  with Dragon dictation along with smaller phrase technology. Any transcriptional errors that result from this process are unintentional.

## 2017-12-07 ENCOUNTER — Encounter: Payer: Self-pay | Admitting: *Deleted

## 2017-12-07 LAB — GLUCOSE, CAPILLARY
GLUCOSE-CAPILLARY: 178 mg/dL — AB (ref 65–99)
Glucose-Capillary: 190 mg/dL — ABNORMAL HIGH (ref 65–99)
Glucose-Capillary: 236 mg/dL — ABNORMAL HIGH (ref 65–99)
Glucose-Capillary: 185 mg/dL — ABNORMAL HIGH (ref 65–99)

## 2017-12-07 LAB — URINE CULTURE: Culture: NO GROWTH

## 2017-12-07 MED ORDER — AMLODIPINE BESYLATE 10 MG PO TABS
10.0000 mg | ORAL_TABLET | Freq: Every day | ORAL | Status: DC
  Administered 2017-12-07: 10 mg via ORAL
  Filled 2017-12-07: qty 1

## 2017-12-07 MED ORDER — ISOSORBIDE MONONITRATE ER 60 MG PO TB24
60.0000 mg | ORAL_TABLET | Freq: Every day | ORAL | Status: DC
  Administered 2017-12-07: 60 mg via ORAL
  Filled 2017-12-07 (×2): qty 1

## 2017-12-07 NOTE — Progress Notes (Signed)
[facility] [place] NAME: Brett [name]    MR#:  [id number]  DATE OF BIRTH:  1935-07-29  SUBJECTIVE:  CHIEF COMPLAINT:   Chief Complaint  Patient presents with  . Shortness of Breath  Sleepy, agreeable for discharge tomorrow as not quite feeling strong today and still short of breath, 4 L oxygen REVIEW OF SYSTEMS:  Review of Systems  Constitutional: Negative for chills, fever and weight loss.  HENT: Positive for hearing loss. Negative for nosebleeds and sore throat.   Eyes: Negative for blurred vision.  Respiratory: Positive for cough and shortness of breath. Negative for wheezing.   Cardiovascular: Negative for chest pain, orthopnea, leg swelling and PND.  Gastrointestinal: Negative for abdominal pain, constipation, diarrhea, heartburn, nausea and vomiting.  Genitourinary: Negative for dysuria and urgency.  Musculoskeletal: Negative for back pain.  Skin: Negative for rash.  Neurological: Negative for dizziness, speech change, focal weakness and headaches.  Endo/Heme/Allergies: Does not bruise/bleed easily.  Psychiatric/Behavioral: Negative for depression.   DRUG ALLERGIES:  No Known Allergies VITALS:  Blood pressure 123/70, pulse 90, temperature 97.7 F (36.5 C), resp. rate 18, height 5\' 10"  (1.778 m), weight 84.1 kg (185 lb 4.8 oz), SpO2 97 %. PHYSICAL EXAMINATION:  Physical Exam  Constitutional: He is oriented to person, place, and time and well-developed, well-nourished, and in no distress.  HENT:  Head: Normocephalic and atraumatic.  Eyes: Conjunctivae and EOM are normal. Pupils are equal, round, and reactive to light.  Neck: Normal range of motion. Neck supple. No tracheal deviation present. No thyromegaly present.  Cardiovascular: Normal rate, regular rhythm and normal heart sounds.  Pulmonary/Chest: Effort normal and breath sounds normal. No respiratory distress. He has no wheezes. He exhibits no tenderness.  Abdominal:  Soft. Bowel sounds are normal. He exhibits no distension. There is no tenderness.  Musculoskeletal: Normal range of motion.  Neurological: He is alert and oriented to person, place, and time. No cranial nerve deficit.  Skin: Skin is warm and dry. No rash noted.  Psychiatric: Mood and affect normal.   LABORATORY PANEL:  Male CBC Recent Labs  Lab 12/05/17 1646  WBC 8.3  HGB 11.5*  HCT 35.0*  PLT 240   ------------------------------------------------------------------------------------------------------------------ Chemistries  Recent Labs  Lab 12/05/17 1646  NA 141  K 4.2  CL 99*  CO2 30  GLUCOSE 169*  BUN 28*  CREATININE 1.40*  CALCIUM 8.8*  AST 17  ALT 14*  ALKPHOS 88  BILITOT 0.9   RADIOLOGY:  No results found. ASSESSMENT AND PLAN:  Brett [name]  is a 83 y.o. male with a known history of PD on 2 L nasal cannula oxygen, decreased vision hypertension, impaired hearing admitted with increasing shortness of breath.  1.  Acute on chronic COPD exacerbation - continue IV Solu-Medrol, nebulizer, inhalers -Chest x-ray no evidence of pneumonia - hold off on antibiotic -Incentive spirometer -Wean his oxygen and ambulate to check oxygen level  2.  Hypertension -Continue amlodipine, lisinopril and Imdur  3.  Acute on chronic kidney disease stage II - monitor, likely prerenal   4. Sinus tach - likely due to breathing issues     All the records are reviewed and case discussed with Care Management/Social Worker. Management plans discussed with the patient, nursing and they are in agreement.  CODE STATUS: Full Code  TOTAL TIME TAKING CARE OF THIS PATIENT: 25 minutes.   More than 50% of the time was spent in counseling/coordination of care: YES  POSSIBLE  D/C IN 1 DAYS, DEPENDING ON CLINICAL CONDITION.   Max Sane M.D on 12/07/2017 at 4:24 PM  Between 7am to 6pm - Pager - [phone]  After 6pm go to [email or web address] - Proofreader  [employer]  [facility]  Office  [phone]  CC: Primary care physician; Brett Maltese, MD  Note: This dictation was prepared with Dragon dictation along with smaller phrase technology. Any transcriptional errors that result from this process are unintentional.

## 2017-12-07 NOTE — Progress Notes (Signed)
While attempting to wean pt I got my pulse ox prior and it was 95 % on 3 L Brett [name] sitting on his bed. I dropped the oxygen to 2 while walking and his pulse ox went to 85% so I got him to take some deep breaths and I put it back to 3 L and it went back up to 92. Once I got the pt back to his room I put his oxygen on 2.5 and he stayed at 92%. I was able to get him stable at 92% and I will continue to monitor him.

## 2017-12-07 NOTE — Progress Notes (Signed)
Inpatient Diabetes Program Recommendations  AACE/ADA: New Consensus Statement on Inpatient Glycemic Control (2015)  Target Ranges:  Prepandial:   less than 140 mg/dL      Peak postprandial:   less than 180 mg/dL (1-2 hours)      Critically ill patients:  140 - 180 mg/dL   Results for COLLIN, RENGEL (MRN [id number]) as of 12/07/2017 12:20  Ref. Range 12/06/2017 22:02 12/07/2017 08:00 12/07/2017 11:44  Glucose-Capillary Latest Ref Range: 65 - 99 mg/dL 251 (H) 190 (H) 236 (H)    Home DM Meds: Amaryl 1 mg BID  Current Insulin Orders: Amaryl 1 mg BID      Novolog Moderate Correction Scale/ SSI (0-15 units) TID AC + HS       MD- Note patient getting Solumedrol 60 mg daily.  CBGs elevated today.  If patient to remain on IV steroids, please consider adding low dose basal insulin to in-hospital insulin regimen:  Lantus 8 units daily (0.1 units/kg dosing)      --Will follow patient during hospitalization--  Wyn Quaker RN, MSN, CDE Diabetes Coordinator Inpatient Glycemic Control Team Team Pager: [phone] (8a-5p)

## 2017-12-08 LAB — CBC
HCT: 30.7 % — ABNORMAL LOW (ref 40.0–52.0)
HEMOGLOBIN: 9.7 g/dL — AB (ref 13.0–18.0)
MCH: 26.9 pg (ref 26.0–34.0)
MCHC: 31.6 g/dL — ABNORMAL LOW (ref 32.0–36.0)
MCV: 85.3 fL (ref 80.0–100.0)
PLATELETS: 248 10*3/uL (ref 150–440)
RBC: 3.6 MIL/uL — AB (ref 4.40–5.90)
RDW: 18.1 % — ABNORMAL HIGH (ref 11.5–14.5)
WBC: 8.6 10*3/uL (ref 3.8–10.6)

## 2017-12-08 LAB — BASIC METABOLIC PANEL
ANION GAP: 11 (ref 5–15)
BUN: 42 mg/dL — AB (ref 6–20)
CHLORIDE: 106 mmol/L (ref 101–111)
CO2: 26 mmol/L (ref 22–32)
Calcium: 8.2 mg/dL — ABNORMAL LOW (ref 8.9–10.3)
Creatinine, Ser: 1.46 mg/dL — ABNORMAL HIGH (ref 0.61–1.24)
GFR calc Af Amer: 51 mL/min — ABNORMAL LOW (ref >60)
GFR, EST NON AFRICAN AMERICAN: 44 mL/min — AB (ref >60)
Glucose, Bld: 226 mg/dL — ABNORMAL HIGH (ref 65–99)
POTASSIUM: 4.5 mmol/L (ref 3.5–5.1)
Sodium: 143 mmol/L (ref 135–145)

## 2017-12-08 LAB — GLUCOSE, CAPILLARY: Glucose-Capillary: 194 mg/dL — ABNORMAL HIGH (ref 65–99)

## 2017-12-08 MED ORDER — PREDNISONE 10 MG (21) PO TBPK: ORAL_TABLET | ORAL | 0 refills | Status: DC

## 2017-12-08 MED ORDER — AMOXICILLIN-POT CLAVULANATE 875-125 MG PO TABS: 1.0000 | ORAL_TABLET | Freq: Two times a day (BID) | ORAL | 0 refills | Status: AC

## 2017-12-08 MED ORDER — MOMETASONE FURO-FORMOTEROL FUM 100-5 MCG/ACT IN AERO: 2.0000 | INHALATION_SPRAY | Freq: Two times a day (BID) | RESPIRATORY_TRACT | 0 refills | Status: DC

## 2017-12-08 NOTE — Care Management Note (Signed)
Case Management Note  Patient Details  Name: Brett [name] MRN: [id number] Date of Birth: Jan 04, 1935   Patient lives at home alone.  Friend named [name] provides all transportation, and comes by daily to check on the patient.  PCP [name].  Pharmacy [facility].  Patient has RW and cane in the home, but does not use them.  Home health orders have been written for RN, PT, and aide.  Home health agency preference provided.  Patient has had [facility] previously and would like to use them again.  Patient has chronic O2, and [name] for discharge.  [name] with [facility] notified of referral and asked to call the home phone [phone].  RNCM signing off.   Subjective/Objective:                    Action/Plan:   Expected Discharge Date:  12/08/17               Expected Discharge Plan:  [place]  In-House Referral:     Discharge planning Services  CM Consult  Post Acute Care Choice:  Home Health Choice offered to:  Patient  DME Arranged:    DME Agency:     HH Arranged:  RN, PT, Nurse's Aide [place] Agency:  [facility]  Status of Service:  Completed, signed off  If discussed at [place] of Stay Meetings, dates discussed:    Additional Comments:  [name], RN 12/08/2017, 10:50 AM

## 2017-12-08 NOTE — Care Management Important Message (Signed)
Important Message  Patient Details  Name: Brett [name] MRN: [id number] Date of Birth: 1935/04/01   Medicare Important Message Given:  N/A - LOS <3 / Initial given by admissions    [name], RN 12/08/2017, 10:36 AM

## 2017-12-08 NOTE — Progress Notes (Signed)
12/08/2017 11:07 AM  [name] to be D/C'd Home per MD order.  Discussed prescriptions and follow up appointments with the patient. Prescriptions given to patient, medication list explained in detail. Pt verbalized understanding.  Allergies as of 12/08/2017   No Known Allergies     Medication List    TAKE these medications   amLODipine 5 MG tablet Commonly known as:  NORVASC Take 5 mg by mouth daily.   amoxicillin-clavulanate 875-125 MG tablet Commonly known as:  AUGMENTIN Take 1 tablet by mouth every 12 (twelve) hours for 7 days.   atorvastatin 80 MG tablet Commonly known as:  LIPITOR Take 80 mg by mouth daily.   clopidogrel 75 MG tablet Commonly known as:  PLAVIX Take 75 mg by mouth daily.   ergocalciferol 50000 units capsule Commonly known as:  VITAMIN D2 Take 50,000 Units by mouth once a week.   gabapentin 100 MG capsule Commonly known as:  NEURONTIN Take 100 mg by mouth 2 (two) times daily.   glimepiride 1 MG tablet Commonly known as:  AMARYL Take 1 mg by mouth 2 (two) times daily.   hydrALAZINE 100 MG tablet Commonly known as:  APRESOLINE Take 100 mg by mouth 3 (three) times daily.   isosorbide mononitrate 30 MG 24 hr tablet Commonly known as:  IMDUR Take 30 mg by mouth daily.   mometasone-formoterol 100-5 MCG/ACT Aero Commonly known as:  DULERA Inhale 2 puffs into the lungs 2 (two) times daily.   pantoprazole 40 MG tablet Commonly known as:  PROTONIX Take 40 mg by mouth 2 (two) times daily before a meal.   predniSONE 10 MG (21) Tbpk tablet Commonly known as:  STERAPRED UNI-PAK 21 TAB Start 60 mg daily, taper 10 mg daily until done       Vitals:   12/08/17 0550 12/08/17 0606  BP: (!) 152/68   Pulse: 82   Resp: 18   Temp: (!) 103.2 F (39.6 C) (!) 97.4 F (36.3 C)  SpO2: 97%     Skin clean, dry and intact without evidence of skin break down, no evidence of skin tears noted. IV catheter discontinued intact. Site without signs and  symptoms of complications. Dressing and pressure applied. Pt denies pain at this time. No complaints noted.  An After Visit Summary was printed and given to the patient. Patient escorted via [place], and D/C home via private auto.  Brett [name]

## 2017-12-08 NOTE — Progress Notes (Signed)
Inpatient Diabetes Program Recommendations  AACE/ADA: New Consensus Statement on Inpatient Glycemic Control (2015)  Target Ranges:  Prepandial:   less than 140 mg/dL      Peak postprandial:   less than 180 mg/dL (1-2 hours)      Critically ill patients:  140 - 180 mg/dL   Lab Results  Component Value Date   GLUCAP 194 (H) 12/08/2017    Review of Glycemic Control  Results for [name], [name] (MRN [id number]) as of 12/08/2017 09:03  Ref. Range 12/07/2017 08:00 12/07/2017 11:44 12/07/2017 16:38 12/07/2017 20:36 12/08/2017 07:52  Glucose-Capillary Latest Ref Range: 65 - 99 mg/dL 190 (H) 236 (H) 185 (H) 178 (H) 194 (H)   Home DM Meds: Amaryl 1 mg BID  Current Insulin Orders: Amaryl 1 mg BID,  Novolog Moderate Correction Scale/ SSI (0-15 units) TID AC, Novolog 0-5 units qhs * Note patient getting Solumedrol 60 mg daily.  If patient to remain on IV steroids, please consider adding low dose basal insulin to in-hospital insulin regimen: Lantus 8 units daily (0.1 units/kg dosing)   Gentry Fitz, RN, BA, [place], CDE Diabetes Coordinator Inpatient Diabetes Program  [phone] (Team Pager) [phone] ([place]) 12/08/2017 9:09 AM

## 2017-12-08 NOTE — Progress Notes (Signed)
As the patient was sleeping his oxygen dropped to 85% on 2.5L. I woke him up and encouraged him to take some deep breaths and he remained between 84% to 86%. I put the patient on 4L oxygen and he got up to 93%. Will continue to monitor the patient and attempt to wean the patient back to 3L of oxygen.

## 2017-12-10 LAB — BLOOD GAS, VENOUS
Acid-Base Excess: 8.7 mmol/L — ABNORMAL HIGH (ref 0.0–2.0)
Bicarbonate: 36.4 mmol/L — ABNORMAL HIGH (ref 20.0–28.0)
PATIENT TEMPERATURE: 37
PCO2 VEN: 66 mmHg — AB (ref 44.0–60.0)
PH VEN: 7.35 (ref 7.250–7.430)

## 2017-12-10 LAB — CULTURE, BLOOD (ROUTINE X 2)
Culture: NO GROWTH
Culture: NO GROWTH
SPECIAL REQUESTS: ADEQUATE
Special Requests: ADEQUATE

## 2017-12-12 NOTE — Discharge Summary (Signed)
Brett [name] at [place] NAME: Brett [name]    MR#:  [id number]  DATE OF BIRTH:  11/21/1935  DATE OF ADMISSION:  12/05/2017   ADMITTING PHYSICIAN: Fritzi Mandes, MD  DATE OF DISCHARGE: 12/08/2017 11:23 AM  PRIMARY CARE PHYSICIAN: Perrin Maltese, MD   ADMISSION DIAGNOSIS:  Sinus tachycardia [R00.0] Hypercarbia [R06.89] Elevated troponin [R74.8] DISCHARGE DIAGNOSIS:  Active Problems:   COPD exacerbation (Brett [name])  SECONDARY DIAGNOSIS:   Past Medical History:  Diagnosis Date  . Anemia   . Aortic regurgitation   . B-cell lymphoma (Brett [name]) 2009   DX AT [facility]  . Bronchitis   . CAD (coronary artery disease)   . Carotid stenosis   . Diabetes mellitus without complication (Brett [name])   . Diabetes mellitus, type 2 ([place])   . Emphysema of lung (Brett [name])   . Essential hypertension   . History of chemotherapy   . Hyperlipidemia   . Hypertension   . IDA (iron deficiency anemia)   . Leg edema   . Meralgia paraesthetica   . Mitral regurgitation   . PUD (peptic ulcer disease)   . PVD (peripheral vascular disease) (Brett [name])   . Tobacco abuse    HOSPITAL COURSE:  [name] a83 y.o.malewith a known history of PD on 2 L nasal cannula oxygen, decreased vision hypertension, impaired hearing admitted with increasing shortness of breath.  1. Acute on chronic COPD exacerbation - improved by steroids, nebs  2. Hypertension -Continue amlodipine, lisinopril and Imdur  3.  Acute on chronic kidney disease stage II: at baseline now - monitor, likely prerenal   4. Sinus tach: resolved - likely due to breathing issues DISCHARGE CONDITIONS:  stable CONSULTS OBTAINED:   DRUG ALLERGIES:  No Known Allergies DISCHARGE MEDICATIONS:   Allergies as of 12/08/2017   No Known Allergies     Medication List    TAKE these medications   amLODipine 5 MG tablet Commonly known as:  NORVASC Take 5 mg by mouth daily.   amoxicillin-clavulanate 875-125 MG  tablet Commonly known as:  AUGMENTIN Take 1 tablet by mouth every 12 (twelve) hours for 7 days.   atorvastatin 80 MG tablet Commonly known as:  LIPITOR Take 80 mg by mouth daily.   clopidogrel 75 MG tablet Commonly known as:  PLAVIX Take 75 mg by mouth daily.   ergocalciferol 50000 units capsule Commonly known as:  VITAMIN D2 Take 50,000 Units by mouth once a week.   gabapentin 100 MG capsule Commonly known as:  NEURONTIN Take 100 mg by mouth 2 (two) times daily.   glimepiride 1 MG tablet Commonly known as:  AMARYL Take 1 mg by mouth 2 (two) times daily.   hydrALAZINE 100 MG tablet Commonly known as:  APRESOLINE Take 100 mg by mouth 3 (three) times daily.   isosorbide mononitrate 30 MG 24 hr tablet Commonly known as:  IMDUR Take 30 mg by mouth daily.   mometasone-formoterol 100-5 MCG/ACT Aero Commonly known as:  DULERA Inhale 2 puffs into the lungs 2 (two) times daily.   pantoprazole 40 MG tablet Commonly known as:  PROTONIX Take 40 mg by mouth 2 (two) times daily before a meal.   predniSONE 10 MG (21) Tbpk tablet Commonly known as:  STERAPRED UNI-PAK 21 TAB Start 60 mg daily, taper 10 mg daily until done        DISCHARGE INSTRUCTIONS:   DIET:  Regular diet DISCHARGE CONDITION:  Good ACTIVITY:  Activity as tolerated OXYGEN:  Home Oxygen:  Yes.    Oxygen Delivery: 2 liters via [place] DISCHARGE LOCATION:  home with home health  If you experience worsening of your admission symptoms, develop shortness of breath, life threatening emergency, suicidal or homicidal thoughts you must seek medical attention immediately by calling 911 or calling your MD immediately  if symptoms less severe.  You Must read complete instructions/literature along with all the possible adverse reactions/side effects for all the Medicines you take and that have been prescribed to you. Take any new Medicines after you have completely understood and accpet all the possible adverse  reactions/side effects.   Please note  You were cared for by a hospitalist during your hospital stay. If you have any questions about your discharge medications or the care you received while you were in the hospital after you are discharged, you can call the unit and asked to speak with the hospitalist on call if the hospitalist that took care of you is not available. Once you are discharged, your primary care physician will handle any further medical issues. Please note that NO REFILLS for any discharge medications will be authorized once you are discharged, as it is imperative that you return to your primary care physician (or establish a relationship with a primary care physician if you do not have one) for your aftercare needs so that they can reassess your need for medications and monitor your lab values.    On the day of Discharge:  VITAL SIGNS:  Blood pressure (!) 152/68, pulse 82, temperature (!) 97.4 F (36.3 C), temperature source Oral, resp. rate 18, height 5\' 10"  (1.778 m), weight 84.1 kg (185 lb 4.8 oz), SpO2 97 %. PHYSICAL EXAMINATION:  GENERAL:  83 y.o.-year-old patient lying in the bed with no acute distress.  EYES: Pupils equal, round, reactive to light and accommodation. No scleral icterus. Extraocular muscles intact.  HEENT: Head atraumatic, normocephalic. Oropharynx and nasopharynx clear.  NECK:  Supple, no jugular venous distention. No thyroid enlargement, no tenderness.  LUNGS: Normal breath sounds bilaterally, no wheezing, rales,rhonchi or crepitation. No use of accessory muscles of respiration.  CARDIOVASCULAR: S1, S2 normal. No murmurs, rubs, or gallops.  ABDOMEN: Soft, non-tender, non-distended. Bowel sounds present. No organomegaly or mass.  EXTREMITIES: No pedal edema, cyanosis, or clubbing.  NEUROLOGIC: Cranial nerves II through XII are intact. Muscle strength 5/5 in all extremities. Sensation intact. Gait not checked.  PSYCHIATRIC: The patient is alert and  oriented x 3.  SKIN: No obvious rash, lesion, or ulcer.  DATA REVIEW:   CBC Recent Labs  Lab 12/08/17 0516  WBC 8.6  HGB 9.7*  HCT 30.7*  PLT 248    Chemistries  Recent Labs  Lab 12/05/17 1646 12/08/17 0516  NA 141 143  K 4.2 4.5  CL 99* 106  CO2 30 26  GLUCOSE 169* 226*  BUN 28* 42*  CREATININE 1.40* 1.46*  CALCIUM 8.8* 8.2*  AST 17  --   ALT 14*  --   ALKPHOS 88  --   BILITOT 0.9  --     Follow-up Information    Perrin Maltese, MD. Go on 12/15/2017.   Specialty:  Internal Medicine Why:  Thursday at 11:00am for hospital follow-up Contact information: [address] [place] [address] [phone]        Allyne Gee, MD. Go on 12/19/2017.   Specialties:  Internal Medicine, Pulmonary Disease Why:  Monday at 9:45am for hospital follow-up. New patient appointment, please bring your ID, insurance card,and copay Contact information:  [address] [place] [address] [phone]           Management plans discussed with the patient, family and they are in agreement.  CODE STATUS: Prior   TOTAL TIME TAKING CARE OF THIS PATIENT: 45 minutes.    Max Sane M.D on 12/12/2017 at 10:41 AM  Between 7am to 6pm - Pager - [phone]  After 6pm go to [email or web address] - Proofreader  [facility]  Office  [phone]  CC: Primary care physician; Perrin Maltese, MD   Note: This dictation was prepared with Dragon dictation along with smaller phrase technology. Any transcriptional errors that result from this process are unintentional.

## 2017-12-19 ENCOUNTER — Ambulatory Visit: Payer: Self-pay | Admitting: Internal Medicine

## 2017-12-30 ENCOUNTER — Emergency Department: Payer: Medicare PPO

## 2017-12-30 ENCOUNTER — Other Ambulatory Visit: Payer: Self-pay

## 2017-12-30 ENCOUNTER — Encounter: Payer: Self-pay | Admitting: Emergency Medicine

## 2017-12-30 ENCOUNTER — Inpatient Hospital Stay
Admission: EM | Admit: 2017-12-30 | Discharge: 2018-01-01 | DRG: 871 | Disposition: A | Discharge status: Home Health | Payer: Medicare PPO | Attending: Internal Medicine | Admitting: Internal Medicine

## 2017-12-30 DIAGNOSIS — J441 Chronic obstructive pulmonary disease with (acute) exacerbation: Secondary | ICD-10-CM | POA: Diagnosis present

## 2017-12-30 DIAGNOSIS — Y95 Nosocomial condition: Secondary | ICD-10-CM | POA: Diagnosis present

## 2017-12-30 DIAGNOSIS — J9601 Acute respiratory failure with hypoxia: Secondary | ICD-10-CM | POA: Diagnosis present

## 2017-12-30 DIAGNOSIS — Z8572 Personal history of non-Hodgkin lymphomas: Secondary | ICD-10-CM

## 2017-12-30 DIAGNOSIS — G5710 Meralgia paresthetica, unspecified lower limb: Secondary | ICD-10-CM | POA: Diagnosis present

## 2017-12-30 DIAGNOSIS — Z9221 Personal history of antineoplastic chemotherapy: Secondary | ICD-10-CM | POA: Diagnosis not present

## 2017-12-30 DIAGNOSIS — Z7951 Long term (current) use of inhaled steroids: Secondary | ICD-10-CM

## 2017-12-30 DIAGNOSIS — Z7984 Long term (current) use of oral hypoglycemic drugs: Secondary | ICD-10-CM | POA: Diagnosis not present

## 2017-12-30 DIAGNOSIS — A419 Sepsis, unspecified organism: Principal | ICD-10-CM | POA: Diagnosis present

## 2017-12-30 DIAGNOSIS — E1122 Type 2 diabetes mellitus with diabetic chronic kidney disease: Secondary | ICD-10-CM | POA: Diagnosis present

## 2017-12-30 DIAGNOSIS — Z7902 Long term (current) use of antithrombotics/antiplatelets: Secondary | ICD-10-CM | POA: Diagnosis not present

## 2017-12-30 DIAGNOSIS — Z8711 Personal history of peptic ulcer disease: Secondary | ICD-10-CM | POA: Diagnosis not present

## 2017-12-30 DIAGNOSIS — Z7952 Long term (current) use of systemic steroids: Secondary | ICD-10-CM | POA: Diagnosis not present

## 2017-12-30 DIAGNOSIS — I5032 Chronic diastolic (congestive) heart failure: Secondary | ICD-10-CM | POA: Diagnosis present

## 2017-12-30 DIAGNOSIS — Z8701 Personal history of pneumonia (recurrent): Secondary | ICD-10-CM

## 2017-12-30 DIAGNOSIS — Z79899 Other long term (current) drug therapy: Secondary | ICD-10-CM

## 2017-12-30 DIAGNOSIS — N183 Chronic kidney disease, stage 3 (moderate): Secondary | ICD-10-CM | POA: Diagnosis present

## 2017-12-30 DIAGNOSIS — Z8673 Personal history of transient ischemic attack (TIA), and cerebral infarction without residual deficits: Secondary | ICD-10-CM

## 2017-12-30 DIAGNOSIS — I130 Hypertensive heart and chronic kidney disease with heart failure and stage 1 through stage 4 chronic kidney disease, or unspecified chronic kidney disease: Secondary | ICD-10-CM | POA: Diagnosis present

## 2017-12-30 DIAGNOSIS — J9600 Acute respiratory failure, unspecified whether with hypoxia or hypercapnia: Secondary | ICD-10-CM | POA: Diagnosis present

## 2017-12-30 DIAGNOSIS — Z9981 Dependence on supplemental oxygen: Secondary | ICD-10-CM | POA: Diagnosis not present

## 2017-12-30 DIAGNOSIS — J440 Chronic obstructive pulmonary disease with acute lower respiratory infection: Secondary | ICD-10-CM | POA: Diagnosis present

## 2017-12-30 DIAGNOSIS — I340 Nonrheumatic mitral (valve) insufficiency: Secondary | ICD-10-CM | POA: Diagnosis present

## 2017-12-30 DIAGNOSIS — E1151 Type 2 diabetes mellitus with diabetic peripheral angiopathy without gangrene: Secondary | ICD-10-CM | POA: Diagnosis present

## 2017-12-30 DIAGNOSIS — E785 Hyperlipidemia, unspecified: Secondary | ICD-10-CM | POA: Diagnosis present

## 2017-12-30 DIAGNOSIS — J189 Pneumonia, unspecified organism: Secondary | ICD-10-CM

## 2017-12-30 DIAGNOSIS — Z87891 Personal history of nicotine dependence: Secondary | ICD-10-CM

## 2017-12-30 DIAGNOSIS — R509 Fever, unspecified: Secondary | ICD-10-CM | POA: Diagnosis not present

## 2017-12-30 DIAGNOSIS — N289 Disorder of kidney and ureter, unspecified: Secondary | ICD-10-CM | POA: Diagnosis present

## 2017-12-30 LAB — CBC WITH DIFFERENTIAL/PLATELET
BASOS ABS: 0.1 10*3/uL (ref 0–0.1)
BASOS PCT: 1 %
EOS ABS: 0.1 10*3/uL (ref 0–0.7)
Eosinophils Relative: 1 %
HEMATOCRIT: 31.8 % — AB (ref 40.0–52.0)
Hemoglobin: 10.5 g/dL — ABNORMAL LOW (ref 13.0–18.0)
Lymphocytes Relative: 5 %
Lymphs Abs: 0.8 10*3/uL — ABNORMAL LOW (ref 1.0–3.6)
MCH: 28.3 pg (ref 26.0–34.0)
MCHC: 32.8 g/dL (ref 32.0–36.0)
MCV: 86.2 fL (ref 80.0–100.0)
MONO ABS: 0.9 10*3/uL (ref 0.2–1.0)
MONOS PCT: 6 %
NEUTROS PCT: 87 %
Neutro Abs: 14.3 10*3/uL — ABNORMAL HIGH (ref 1.4–6.5)
PLATELETS: 256 10*3/uL (ref 150–440)
RBC: 3.69 MIL/uL — ABNORMAL LOW (ref 4.40–5.90)
RDW: 18.2 % — AB (ref 11.5–14.5)
WBC: 16.3 10*3/uL — ABNORMAL HIGH (ref 3.8–10.6)

## 2017-12-30 LAB — BASIC METABOLIC PANEL
ANION GAP: 12 (ref 5–15)
BUN: 21 mg/dL — ABNORMAL HIGH (ref 6–20)
CO2: 30 mmol/L (ref 22–32)
CREATININE: 1.48 mg/dL — AB (ref 0.61–1.24)
Calcium: 9.2 mg/dL (ref 8.9–10.3)
Chloride: 100 mmol/L — ABNORMAL LOW (ref 101–111)
GFR calc Af Amer: 50 mL/min — ABNORMAL LOW (ref >60)
GFR, EST NON AFRICAN AMERICAN: 43 mL/min — AB (ref >60)
Glucose, Bld: 122 mg/dL — ABNORMAL HIGH (ref 65–99)
Potassium: 4.2 mmol/L (ref 3.5–5.1)
SODIUM: 142 mmol/L (ref 135–145)

## 2017-12-30 LAB — GLUCOSE, CAPILLARY
GLUCOSE-CAPILLARY: 116 mg/dL — AB (ref 65–99)
Glucose-Capillary: 297 mg/dL — ABNORMAL HIGH (ref 65–99)

## 2017-12-30 LAB — TROPONIN I: TROPONIN I: 0.03 ng/mL — AB (ref <0.03)

## 2017-12-30 LAB — LACTIC ACID, PLASMA: Lactic Acid, Venous: 1.5 mmol/L (ref 0.5–1.9)

## 2017-12-30 MED ORDER — CLOPIDOGREL BISULFATE 75 MG PO TABS
75.0000 mg | ORAL_TABLET | Freq: Every day | ORAL | Status: DC
  Administered 2017-12-31 – 2018-01-01 (×2): 75 mg via ORAL
  Filled 2017-12-30 (×2): qty 1

## 2017-12-30 MED ORDER — DEXTROSE 5 % IV SOLN
2.0000 g | Freq: Two times a day (BID) | INTRAVENOUS | Status: DC
  Administered 2017-12-30 – 2018-01-01 (×4): 2 g via INTRAVENOUS
  Filled 2017-12-30 (×5): qty 2

## 2017-12-30 MED ORDER — AMLODIPINE BESYLATE 5 MG PO TABS
5.0000 mg | ORAL_TABLET | Freq: Every day | ORAL | Status: DC
  Administered 2017-12-31 – 2018-01-01 (×2): 5 mg via ORAL
  Filled 2017-12-30 (×2): qty 1

## 2017-12-30 MED ORDER — METHYLPREDNISOLONE SODIUM SUCC 125 MG IJ SOLR
125.0000 mg | Freq: Once | INTRAMUSCULAR | Status: AC
  Administered 2017-12-30: 125 mg via INTRAVENOUS
  Filled 2017-12-30: qty 2

## 2017-12-30 MED ORDER — HEPARIN SODIUM (PORCINE) 5000 UNIT/ML IJ SOLN
5000.0000 [IU] | Freq: Three times a day (TID) | INTRAMUSCULAR | Status: DC
  Administered 2017-12-30 – 2018-01-01 (×5): 5000 [IU] via SUBCUTANEOUS
  Filled 2017-12-30 (×5): qty 1

## 2017-12-30 MED ORDER — ORAL CARE MOUTH RINSE
15.0000 mL | Freq: Two times a day (BID) | OROMUCOSAL | Status: DC
  Administered 2017-12-30 – 2018-01-01 (×4): 15 mL via OROMUCOSAL

## 2017-12-30 MED ORDER — BUDESONIDE 0.25 MG/2ML IN SUSP
0.2500 mg | Freq: Two times a day (BID) | RESPIRATORY_TRACT | Status: DC
  Administered 2017-12-30 – 2018-01-01 (×4): 0.25 mg via RESPIRATORY_TRACT
  Filled 2017-12-30 (×4): qty 2

## 2017-12-30 MED ORDER — SODIUM CHLORIDE 0.9 % IV SOLN
INTRAVENOUS | Status: DC
  Administered 2017-12-30 – 2017-12-31 (×3): via INTRAVENOUS

## 2017-12-30 MED ORDER — PANTOPRAZOLE SODIUM 40 MG PO TBEC
40.0000 mg | DELAYED_RELEASE_TABLET | Freq: Every day | ORAL | Status: DC
  Administered 2017-12-31 – 2018-01-01 (×2): 40 mg via ORAL
  Filled 2017-12-30 (×2): qty 1

## 2017-12-30 MED ORDER — IPRATROPIUM-ALBUTEROL 0.5-2.5 (3) MG/3ML IN SOLN
3.0000 mL | Freq: Once | RESPIRATORY_TRACT | Status: AC
  Administered 2017-12-30: 3 mL via RESPIRATORY_TRACT
  Filled 2017-12-30: qty 3

## 2017-12-30 MED ORDER — IPRATROPIUM-ALBUTEROL 0.5-2.5 (3) MG/3ML IN SOLN
3.0000 mL | RESPIRATORY_TRACT | Status: DC
  Administered 2017-12-30 – 2017-12-31 (×4): 3 mL via RESPIRATORY_TRACT
  Filled 2017-12-30 (×4): qty 3

## 2017-12-30 MED ORDER — TIOTROPIUM BROMIDE MONOHYDRATE 18 MCG IN CAPS
18.0000 ug | ORAL_CAPSULE | Freq: Every day | RESPIRATORY_TRACT | Status: DC
  Administered 2017-12-31 – 2018-01-01 (×2): 18 ug via RESPIRATORY_TRACT
  Filled 2017-12-30: qty 5

## 2017-12-30 MED ORDER — SENNOSIDES-DOCUSATE SODIUM 8.6-50 MG PO TABS
1.0000 | ORAL_TABLET | Freq: Two times a day (BID) | ORAL | Status: DC
  Administered 2017-12-30 – 2018-01-01 (×4): 1 via ORAL
  Filled 2017-12-30 (×4): qty 1

## 2017-12-30 MED ORDER — FLUTICASONE PROPIONATE 50 MCG/ACT NA SUSP
2.0000 | Freq: Every day | NASAL | Status: DC | PRN
  Filled 2017-12-30: qty 16

## 2017-12-30 MED ORDER — INSULIN ASPART 100 UNIT/ML ~~LOC~~ SOLN
0.0000 [IU] | Freq: Three times a day (TID) | SUBCUTANEOUS | Status: DC
  Administered 2017-12-31: 9 [IU] via SUBCUTANEOUS
  Administered 2017-12-31: 10:00:00 5 [IU] via SUBCUTANEOUS
  Administered 2017-12-31 – 2018-01-01 (×2): 3 [IU] via SUBCUTANEOUS
  Filled 2017-12-30 (×4): qty 1

## 2017-12-30 MED ORDER — VANCOMYCIN HCL IN DEXTROSE 1-5 GM/200ML-% IV SOLN
1000.0000 mg | Freq: Once | INTRAVENOUS | Status: AC
  Administered 2017-12-30: 1000 mg via INTRAVENOUS
  Filled 2017-12-30: qty 200

## 2017-12-30 MED ORDER — ATORVASTATIN CALCIUM 20 MG PO TABS
80.0000 mg | ORAL_TABLET | Freq: Every day | ORAL | Status: DC
  Administered 2017-12-31 – 2018-01-01 (×2): 80 mg via ORAL
  Filled 2017-12-30 (×2): qty 4

## 2017-12-30 MED ORDER — IPRATROPIUM-ALBUTEROL 0.5-2.5 (3) MG/3ML IN SOLN
3.0000 mL | Freq: Once | RESPIRATORY_TRACT | Status: AC
Start: 2017-12-30 — End: 2017-12-30
  Administered 2017-12-30: 3 mL via RESPIRATORY_TRACT
  Filled 2017-12-30: qty 3

## 2017-12-30 MED ORDER — ACETAMINOPHEN 325 MG PO TABS: 650.0000 mg | ORAL_TABLET | Freq: Four times a day (QID) | ORAL | Status: DC | PRN

## 2017-12-30 MED ORDER — ONDANSETRON HCL 4 MG/2ML IJ SOLN: 4.0000 mg | Freq: Four times a day (QID) | INTRAMUSCULAR | Status: DC | PRN

## 2017-12-30 MED ORDER — GABAPENTIN 100 MG PO CAPS
100.0000 mg | ORAL_CAPSULE | Freq: Two times a day (BID) | ORAL | Status: DC
  Administered 2017-12-30 – 2018-01-01 (×4): 100 mg via ORAL
  Filled 2017-12-30 (×4): qty 1

## 2017-12-30 MED ORDER — METHYLPREDNISOLONE SODIUM SUCC 125 MG IJ SOLR
60.0000 mg | Freq: Four times a day (QID) | INTRAMUSCULAR | Status: DC
  Administered 2017-12-30 – 2017-12-31 (×3): 60 mg via INTRAVENOUS
  Filled 2017-12-30 (×3): qty 2

## 2017-12-30 MED ORDER — DOCUSATE SODIUM 100 MG PO CAPS: 100.0000 mg | ORAL_CAPSULE | Freq: Two times a day (BID) | ORAL | Status: DC | PRN

## 2017-12-30 MED ORDER — VANCOMYCIN HCL 10 G IV SOLR
1250.0000 mg | INTRAVENOUS | Status: DC
  Administered 2017-12-30: 23:00:00 1250 mg via INTRAVENOUS
  Filled 2017-12-30 (×2): qty 1250

## 2017-12-30 MED ORDER — DEXTROSE 5 % IV SOLN
1.0000 g | Freq: Once | INTRAVENOUS | Status: AC
  Administered 2017-12-30: 1 g via INTRAVENOUS
  Filled 2017-12-30: qty 1

## 2017-12-30 MED ORDER — FUROSEMIDE 40 MG PO TABS: 40.0000 mg | ORAL_TABLET | Freq: Every day | ORAL | Status: DC

## 2017-12-30 MED ORDER — SODIUM CHLORIDE 0.9 % IV BOLUS (SEPSIS)
1000.0000 mL | Freq: Once | INTRAVENOUS | Status: AC
  Administered 2017-12-30: 1000 mL via INTRAVENOUS

## 2017-12-30 NOTE — ED Notes (Signed)
Called lab to send flu swabs, informed MD that machine for resulting was down.

## 2017-12-30 NOTE — ED Triage Notes (Addendum)
Pt arrived via EMS after patient's friend called them and stated he was not acting like himself.  Pt is AOx4 during triage.  He was admitted a couple of weeks ago for pneumonia and stayed for 4 days according to EMS.  EMS found patient satting at 84% on 2L at home.  Pt has hx of COPD and diabetes.  EMS gave patient duoneb x1 PTA and patient was at 94% on 2L [place].

## 2017-12-30 NOTE — H&P (Addendum)
[facility] NAME: Brett [name]    MR#:  [id number]  DATE OF BIRTH:  07/18/1935  DATE OF ADMISSION:  12/30/2017  PRIMARY CARE PHYSICIAN: Perrin Maltese, MD   REQUESTING/REFERRING PHYSICIAN: Alfred Levins  CHIEF COMPLAINT:   Chief Complaint  Patient presents with  . Fever  . Chills    HISTORY OF PRESENT ILLNESS: Brett [name]  is a 83 y.o. male with a known history of B cell lymphoma, CAD, DM, COPD, Htn, HLD, iron deficiency anemia, peptic ulcer disease, peripheral vascular disease- had admission twice in last 2 months for respiratory reasons. Since he last 2 days he is feeling worsening shortness of breath and last night he had significant shortness of breath with fever and chills, also had episode of nausea and 1-2 loose bowel movement last night but this morning has bowel movement was normal. In ER he is noted to have tachycardia, elevated white blood cell count, infiltrate on his chest x-ray. He took 2 Tylenol tablets this morning before coming to ER. He also had complain of headache, both arm pains, back pain, chest pain. Influenza test is seen and as per ER physician, but in the lab the influenza checking machine is broken so it will take time to get the results back. Meanwhile ER physician started him on treatment for healthcare associated pneumonia and given her admission to hospitalist team.  PAST MEDICAL HISTORY:   Past Medical History:  Diagnosis Date  . Anemia   . Aortic regurgitation   . B-cell lymphoma ([place]) 2009   DX AT [facility]  . Bronchitis   . CAD (coronary artery disease)   . Carotid stenosis   . Diabetes mellitus without complication ([place])   . Diabetes mellitus, type 2 ([place])   . Emphysema of lung ([place])   . Essential hypertension   . History of chemotherapy   . Hyperlipidemia   . Hypertension   . IDA (iron deficiency anemia)   . Leg edema   . Meralgia paraesthetica   . Mitral regurgitation   . PUD (peptic  ulcer disease)   . PVD (peripheral vascular disease) ([place])   . Tobacco abuse     PAST SURGICAL HISTORY:  Past Surgical History:  Procedure Laterality Date  . CARDIAC CATHETERIZATION  08/15/2007  . CARDIAC CATHETERIZATION Right 07/13/2016   Procedure: Right/Left Heart Cath and Coronary Angiography;  Surgeon: Dionisio David, MD;  Location: Home CV LAB;  Service: Cardiovascular;  Laterality: Right;  . COLONOSCOPY  03/2013  . ESOPHAGOGASTRODUODENOSCOPY  03/2013    SOCIAL HISTORY:  Social History   Tobacco Use  . Smoking status: Former Smoker    Types: Cigarettes    Last attempt to quit: 08/30/2015    Years since quitting: 2.3  . Smokeless tobacco: Never Used  Substance Use Topics  . Alcohol use: No    Frequency: Never    FAMILY HISTORY:  Family History  Problem Relation Age of Onset  . Rheum arthritis Neg Hx   . Osteoarthritis Neg Hx   . Asthma Neg Hx   . Diabetes Neg Hx   . Cancer Neg Hx     DRUG ALLERGIES: No Known Allergies  REVIEW OF SYSTEMS:   CONSTITUTIONAL: Positive for fever, fatigue or weakness.  EYES: No blurred or double vision.  EARS, NOSE, AND THROAT: No tinnitus or ear pain.  RESPIRATORY: He has cough, shortness of breath, no wheezing or hemoptysis.  CARDIOVASCULAR: No chest pain, orthopnea, edema.  GASTROINTESTINAL: No nausea, vomiting, diarrhea or abdominal pain.  GENITOURINARY: No dysuria, hematuria.  ENDOCRINE: No polyuria, nocturia,  HEMATOLOGY: No anemia, easy bruising or bleeding SKIN: No rash or lesion. MUSCULOSKELETAL: No joint pain or arthritis.   NEUROLOGIC: No tingling, numbness, weakness.  PSYCHIATRY: No anxiety or depression.   MEDICATIONS AT HOME:  Prior to Admission medications   Medication Sig Start Date End Date Taking? Authorizing Provider  albuterol (PROVENTIL HFA;VENTOLIN HFA) 108 (90 Base) MCG/ACT inhaler Inhale 2 puffs into the lungs every 6 (six) hours as needed for wheezing or shortness of breath. 09/24/17  Yes  Wieting, Richard, MD  amLODipine (NORVASC) 5 MG tablet Take 5 mg by mouth daily.  08/27/15  Yes [provider]  atorvastatin (LIPITOR) 20 MG tablet Take 1 tablet (20 mg total) by mouth daily at 6 PM. 09/24/17  Yes Wieting, Richard, MD  atorvastatin (LIPITOR) 80 MG tablet Take 80 mg by mouth daily.   Yes [provider]  clopidogrel (PLAVIX) 75 MG tablet Take 75 mg by mouth daily.  08/27/15  Yes [provider]  ergocalciferol (VITAMIN D2) 50000 units capsule Take 50,000 Units by mouth once a week.   Yes [provider]  fluticasone (FLONASE) 50 MCG/ACT nasal spray Place 2 sprays into the nose daily as needed for allergies.   Yes [provider]  furosemide (LASIX) 40 MG tablet Take 1 tablet (40 mg total) by mouth daily. 09/24/17  Yes Wieting, Richard, MD  hydrALAZINE (APRESOLINE) 25 MG tablet Take 2 tablets (50 mg total) by mouth 3 (three) times daily. Patient taking differently: Take 50 mg by mouth 2 (two) times daily.  06/21/16  Yes Gouru, Illene Silver, MD  metoprolol (TOPROL-XL) 200 MG 24 hr tablet Take 1 tablet (200 mg total) by mouth daily. 09/24/17  Yes Wieting, Richard, MD  mometasone-formoterol (DULERA) 100-5 MCG/ACT AERO Inhale 2 puffs into the lungs 2 (two) times daily. 12/08/17  Yes Max Sane, MD  eplerenone (INSPRA) 25 MG tablet Take 50 mg by mouth daily.    [provider]  Fluticasone-Salmeterol (ADVAIR DISKUS) 250-50 MCG/DOSE AEPB Inhale 1 puff into the lungs 2 times daily at 12 noon and 4 pm. Patient not taking: Reported on 11/13/2017 06/21/16   [name], MD  gabapentin (NEURONTIN) 100 MG capsule Take 1 capsule by mouth 2 (two) times daily.  09/22/15   [provider]  glimepiride (AMARYL) 1 MG tablet Take 1 mg by mouth 2 (two) times daily.    [provider]  hydrALAZINE (APRESOLINE) 100 MG tablet Take 100 mg by mouth 3 (three) times daily.    [provider]  isosorbide dinitrate (ISORDIL) 30 MG tablet  Take 30 mg by mouth daily.    [provider]  nicotine (NICODERM CQ - DOSED IN MG/24 HR) 7 mg/24hr patch Place 1 patch (7 mg total) onto the skin daily. Patient not taking: Reported on 11/13/2017 09/24/17   Loletha Grayer, MD  pantoprazole (PROTONIX) 40 MG tablet Take 40 mg by mouth daily.    [provider]  predniSONE (STERAPRED UNI-PAK 21 TAB) 10 MG (21) TBPK tablet Start at 60mg  taper by 10mg  until complete Patient not taking: Reported on 12/30/2017 11/16/17   Dustin Flock, MD  senna-docusate (SENOKOT-S) 8.6-50 MG tablet Take 1 tablet by mouth 2 (two) times daily. Patient not taking: Reported on 11/13/2017 06/21/16   [name], MD  tiotropium (SPIRIVA HANDIHALER) 18 MCG inhalation capsule Place 1 capsule (18 mcg total) into inhaler and inhale daily. Patient  not taking: Reported on 11/13/2017 09/24/17   Loletha Grayer, MD      PHYSICAL EXAMINATION:   VITAL SIGNS: Blood pressure 138/64, pulse (!) 110, temperature 98.2 F (36.8 C), temperature source Oral, resp. rate (!) 30, SpO2 94 %.  GENERAL:  83 y.o.-year-old patient lying in the bed with no acute distress.  EYES: Pupils equal, round, reactive to light and accommodation. No scleral icterus. Extraocular muscles intact.  HEENT: Head atraumatic, normocephalic. Oropharynx and nasopharynx clear.  NECK:  Supple, no jugular venous distention. No thyroid enlargement, no tenderness.  LUNGS: Normal breath sounds bilaterally, minimal wheezing, some crepitation. Positive use of accessory muscles of respiration.  CARDIOVASCULAR: S1, S2 normal. No murmurs, rubs, or gallops.  ABDOMEN: Soft, nontender, nondistended. Bowel sounds present. No organomegaly or mass.  EXTREMITIES: No pedal edema, cyanosis, or clubbing.  NEUROLOGIC: Cranial nerves II through XII are intact. Muscle strength 4-5/5 in all extremities. Sensation intact. Gait not checked.  PSYCHIATRIC: The patient is alert and oriented x 3.  SKIN: No obvious rash,  lesion, or ulcer.   LABORATORY PANEL:   CBC Recent Labs  Lab 12/30/17 1520  WBC 16.3*  HGB 10.5*  HCT 31.8*  PLT 256  MCV 86.2  MCH 28.3  MCHC 32.8  RDW 18.2*  LYMPHSABS 0.8*  MONOABS 0.9  EOSABS 0.1  BASOSABS 0.1   ------------------------------------------------------------------------------------------------------------------  Chemistries  Recent Labs  Lab 12/30/17 1520  NA 142  K 4.2  CL 100*  CO2 30  GLUCOSE 122*  BUN 21*  CREATININE 1.48*  CALCIUM 9.2   ------------------------------------------------------------------------------------------------------------------ CrCl cannot be calculated (Unknown ideal weight.). ------------------------------------------------------------------------------------------------------------------ No results for input(s): TSH, T4TOTAL, T3FREE, THYROIDAB in the last 72 hours.  Invalid input(s): FREET3   Coagulation profile No results for input(s): INR, PROTIME in the last 168 hours. ------------------------------------------------------------------------------------------------------------------- No results for input(s): DDIMER in the last 72 hours. -------------------------------------------------------------------------------------------------------------------  Cardiac Enzymes Recent Labs  Lab 12/30/17 1520  TROPONINI 0.03*   ------------------------------------------------------------------------------------------------------------------ Invalid input(s): POCBNP  ---------------------------------------------------------------------------------------------------------------  Urinalysis    Component Value Date/Time   COLORURINE AMBER (A) 12/05/2017 1730   APPEARANCEUR HAZY (A) 12/05/2017 1730   LABSPEC 1.023 12/05/2017 1730   PHURINE 5.0 12/05/2017 1730   GLUCOSEU 50 (A) 12/05/2017 1730   HGBUR SMALL (A) 12/05/2017 1730   BILIRUBINUR NEGATIVE 12/05/2017 1730   KETONESUR NEGATIVE 12/05/2017 1730   PROTEINUR  100 (A) 12/05/2017 1730   NITRITE NEGATIVE 12/05/2017 1730   LEUKOCYTESUR NEGATIVE 12/05/2017 1730     RADIOLOGY: Dg Chest 2 View  Result Date: 12/30/2017 CLINICAL DATA:  History of pneumonia. EXAM: CHEST  2 VIEW COMPARISON:  11/13/2017. FINDINGS: Mediastinum and hilar structures normal. Bibasilar atelectasis again noted. Mild left base infiltrate again noted. Left pleural effusion again noted. IMPRESSION: 1. Persistent bibasilar atelectasis, left base infiltrate, and left pleural effusion. No significant interim improvement. 2.  Stable cardiomegaly. Electronically Signed   By: [name]  Register   On: 12/30/2017 16:14    EKG: Orders placed or performed during the hospital encounter of 12/30/17  . ED EKG  . ED EKG    IMPRESSION AND PLAN:  * Sepsis   Evident by fever, tachycardia, hypoxia, elevated blood blood cell count.   Broad-spectrum antibiotics for now, IV fluids.   Cultures are sent by ER.  * Healthcare associated pneumonia   IV Vanco and cefepime for now.   Influenza test is sent but it may take longer to get the results back because of some mechanical issue in lab.   Check for  MRSA screening.   We may need to adjust or stop antibiotics depending on the results of above.  * COPD exacerbation   IV and inhaled steroid, nebulizer therapy.   Supplemental oxygen.  * Acute respiratory failure    Continue supplemental oxygen and try to taper  * CKD stage III   Monitor.  * Coronary artery disease   Continue atorvastatin, Plavix.  * History of hypertension   Hold all antihypertensive medication because of sepsis and blood pressure running low.  * DM   Hold oral meds, keep on ISS>  All the records are reviewed and case discussed with ED provider. Management plans discussed with the patient, family and they are in agreement.  CODE STATUS: full. Code Status History    Date Active Date Inactive Code Status Order ID Comments User Context   12/05/2017 21:46 12/08/2017 14:28  Full Code [id number]  Fritzi Mandes, MD Inpatient   11/13/2017 21:23 11/16/2017 17:04 Full Code [id number]  Idelle Crouch, MD Inpatient   09/22/2017 00:36 09/24/2017 18:37 Full Code [id number]  Gorden Harms, MD Inpatient   09/22/2017 00:33 09/22/2017 00:36 DNR [id number]  Gorden Harms, MD ED   12/02/2016 08:12 12/03/2016 20:09 Full Code [id number]  Vaughan Basta, MD Inpatient   07/11/2016 00:18 07/11/2016 17:07 Full Code [id number]  Lance Coon, MD Inpatient   06/18/2016 03:40 06/21/2016 18:44 Full Code [id number]  Saundra Shelling, MD ED       TOTAL TIME TAKING CARE OF THIS PATIENT: 50 minutes.    Vaughan Basta M.D on 12/30/2017   Between 7am to 6pm - Pager - [phone]  After 6pm go to [email or web address] - password [email or web address] [facility]  Office  [phone]  CC: Primary care physician; Perrin Maltese, MD   Note: This dictation was prepared with Dragon dictation along with smaller phrase technology. Any transcriptional errors that result from this process are unintentional.

## 2017-12-30 NOTE — Progress Notes (Addendum)
Pharmacy Antibiotic Note  Brett [name] is a 83 y.o. male admitted on 12/30/2017 with pneumonia and sepsis.  Pharmacy has been consulted for cefepime and vancomycin dosing.  Plan: Vancomycin 1250mg a IV every 24 hours.  Goal trough 15-20 mcg/mL. cefepime 2gm iv q12h  Vancomycin trough before 4th dose  Height: 5\' 10"  (177.8 cm) Weight: 187 lb 6.3 oz (85 kg) IBW/kg (Calculated) : 73  Temp (24hrs), Avg:98.7 F (37.1 C), Min:98.2 F (36.8 C), Max:99.1 F (37.3 C)  Recent Labs  Lab 12/30/17 1520 12/30/17 1638  WBC 16.3*  --   CREATININE 1.48*  --   LATICACIDVEN  --  1.5    Estimated Creatinine Clearance: 41.1 mL/min (A) (by C-G formula based on SCr of 1.48 mg/dL (H)).    No Known Allergies  Antimicrobials this admission: Anti-infectives (From admission, onward)   Start     Dose/Rate Route Frequency Ordered Stop   12/30/17 2300  vancomycin (VANCOCIN) 1,250 mg in sodium chloride 0.9 % 250 mL IVPB     1,250 mg 166.7 mL/hr over 90 Minutes Intravenous Every 24 hours 12/30/17 1824     12/30/17 2300  ceFEPIme (MAXIPIME) 2 g in dextrose 5 % 50 mL IVPB     2 g 100 mL/hr over 30 Minutes Intravenous Every 12 hours 12/30/17 1832     12/30/17 1700  ceFEPIme (MAXIPIME) 1 g in dextrose 5 % 50 mL IVPB     1 g 100 mL/hr over 30 Minutes Intravenous  Once 12/30/17 1646 12/30/17 1806   12/30/17 1700  vancomycin (VANCOCIN) IVPB 1000 mg/200 mL premix     1,000 mg 200 mL/hr over 60 Minutes Intravenous  Once 12/30/17 1646 12/30/17 1837       Microbiology results: No results found for this or any previous visit (from the past 240 hour(s)).   Thank you for allowing pharmacy to be a part of this patient's care.  Donna Christen Arch Methot 12/30/2017 6:36 PM

## 2017-12-30 NOTE — ED Notes (Signed)
Patient transported to X-ray 

## 2017-12-30 NOTE — ED Notes (Signed)
Urged patient to void and gave urinal.  Patient stated he would attempt.

## 2017-12-30 NOTE — ED Provider Notes (Signed)
[facility] Emergency Department Provider Note  ____________________________________________  Time seen: Approximately 4:48 PM  I have reviewed the triage vital signs and the nursing notes.   HISTORY  Chief Complaint Fever and Chills   HPI Brett [name] is a 83 y.o. male with a history of CAD, diabetes, COPD, hypertension, hyperlipidemia, iron deficiency anemia, and peripheral vascular disease who presents for evaluation of fever and shortness of breath. Patient was recently admitted a month ago for pneumonia. Patient reports that he was in his usual state of health yesterday evening. This morning he woke up and could barely get up to go to the bathroom. He felt very weak, developed progressively worsening shortness of breath. Had to increase his oxygen from 2-3 L at home. Does endorse a cough and nausea. Had two lose stools overnight and one episode of NBNB emesis last night. No chest pain, no abdominal pain, no dysuria or hematuria.Patient reports subjective fever this morning however took 2 Tylenol before coming to the emergency room.  Past Medical History:  Diagnosis Date  . Anemia   . Aortic regurgitation   . B-cell lymphoma ([place]) 2009   DX AT [facility]  . Bronchitis   . CAD (coronary artery disease)   . Carotid stenosis   . Diabetes mellitus without complication ([place])   . Diabetes mellitus, type 2 ([place])   . Emphysema of lung ([place])   . Essential hypertension   . History of chemotherapy   . Hyperlipidemia   . Hypertension   . IDA (iron deficiency anemia)   . Leg edema   . Meralgia paraesthetica   . Mitral regurgitation   . PUD (peptic ulcer disease)   . PVD (peripheral vascular disease) ([place])   . Tobacco abuse     Patient Active Problem List   Diagnosis Date Noted  . COPD exacerbation ([place]) 12/05/2017  . CAP (community acquired pneumonia) 11/13/2017  . Acute respiratory failure ([place]) 11/13/2017  . SIRS (systemic inflammatory response  syndrome) ([place]) 11/13/2017  . Stroke ([place]) 12/02/2016  . Stroke (cerebrum) ([place]) 12/02/2016  . Leg weakness, bilateral 09/03/2016  . Chronic diastolic heart failure ([place]) 08/06/2016  . Pleural effusion 08/05/2016  . Sepsis ([place]) 07/10/2016  . HTN (hypertension) 07/10/2016  . Diabetes ([place]) 07/10/2016  . CAD (coronary artery disease) 07/10/2016  . COPD (chronic obstructive pulmonary disease) ([place]) 06/20/2016  . Chest pain 06/18/2016  . B-cell lymphoma ([place]) 10/14/2015    Past Surgical History:  Procedure Laterality Date  . CARDIAC CATHETERIZATION  08/15/2007  . CARDIAC CATHETERIZATION Right 07/13/2016   Procedure: Right/Left Heart Cath and Coronary Angiography;  Surgeon: Dionisio David, MD;  Location: [place] [facility];  Service: Cardiovascular;  Laterality: Right;  . COLONOSCOPY  03/2013  . ESOPHAGOGASTRODUODENOSCOPY  03/2013    Prior to Admission medications   Medication Sig Start Date End Date Taking? Authorizing Provider  albuterol (PROVENTIL HFA;VENTOLIN HFA) 108 (90 Base) MCG/ACT inhaler Inhale 2 puffs into the lungs every 6 (six) hours as needed for wheezing or shortness of breath. 09/24/17   Loletha Grayer, MD  albuterol (PROVENTIL) (2.5 MG/3ML) 0.083% nebulizer solution Take 3 mLs (2.5 mg total) by nebulization every 6 (six) hours as needed for wheezing or shortness of breath. 09/24/17   Loletha Grayer, MD  amLODipine (NORVASC) 5 MG tablet Take 5 mg by mouth daily.  08/27/15   [provider]  amLODipine (NORVASC) 5 MG tablet Take 5 mg by mouth daily.    [provider]  atorvastatin (LIPITOR) 20 MG tablet Take 1 tablet (20 mg total) by mouth daily at 6 PM. 09/24/17   Loletha Grayer, MD  atorvastatin (LIPITOR) 80 MG tablet Take 80 mg by mouth daily.    [provider]  clopidogrel (PLAVIX) 75 MG tablet Take 75 mg by mouth daily.  08/27/15   [provider]  clopidogrel (PLAVIX) 75 MG tablet Take 75 mg by mouth daily.    [provider]  eplerenone (INSPRA) 25 MG tablet Take 50 mg by mouth daily.    [provider]  ergocalciferol (VITAMIN D2) 50000 units capsule Take 50,000 Units by mouth once a week.    [provider]  fluticasone (FLONASE) 50 MCG/ACT nasal spray Place 2 sprays into the nose daily as needed for allergies.    [provider]  Fluticasone-Salmeterol (ADVAIR DISKUS) 250-50 MCG/DOSE AEPB Inhale 1 puff into the lungs 2 times daily at 12 noon and 4 pm. Patient not taking: Reported on 11/13/2017 06/21/16   [name], MD  furosemide (LASIX) 40 MG tablet Take 1 tablet (40 mg total) by mouth daily. 09/24/17   Loletha Grayer, MD  gabapentin (NEURONTIN) 100 MG capsule Take 1 capsule by mouth 2 (two) times daily.  09/22/15   [provider]  gabapentin (NEURONTIN) 100 MG capsule Take 100 mg by mouth 2 (two) times daily.    [provider]  glimepiride (AMARYL) 1 MG tablet Take 1 mg by mouth 2 (two) times daily.    [provider]  glimepiride (AMARYL) 1 MG tablet Take 1 mg by mouth 2 (two) times daily.    [provider]  hydrALAZINE (APRESOLINE) 100 MG tablet Take 100 mg by mouth 3 (three) times daily.    [provider]  hydrALAZINE (APRESOLINE) 25 MG tablet Take 2 tablets (50 mg total) by mouth 3 (three) times daily. Patient taking differently: Take 50 mg by mouth 2 (two) times daily.  06/21/16   [name], MD  isosorbide dinitrate (ISORDIL) 30 MG tablet Take 30 mg by mouth daily.    [provider]  isosorbide mononitrate (IMDUR) 30 MG 24 hr tablet Take 30 mg by mouth daily.    [provider]  metoprolol (TOPROL-XL) 200 MG 24 hr tablet Take 1 tablet (200 mg total) by mouth daily. 09/24/17   Loletha Grayer, MD  mometasone-formoterol (DULERA) 100-5 MCG/ACT AERO Inhale 2 puffs into the lungs 2 (two) times daily. 12/08/17   Max Sane, MD  nicotine (NICODERM CQ - DOSED IN MG/24 HR) 7 mg/24hr patch Place 1 patch  (7 mg total) onto the skin daily. Patient not taking: Reported on 11/13/2017 09/24/17   Loletha Grayer, MD  pantoprazole (PROTONIX) 40 MG tablet Take 40 mg by mouth daily.    [provider]  pantoprazole (PROTONIX) 40 MG tablet Take 40 mg by mouth 2 (two) times daily before a meal.    [provider]  predniSONE (STERAPRED UNI-PAK 21 TAB) 10 MG (21) TBPK tablet Start at 60mg  taper by 10mg  until complete 11/16/17   Dustin Flock, MD  predniSONE (STERAPRED UNI-PAK 21 TAB) 10 MG (21) TBPK tablet Start 60 mg daily, taper 10 mg daily until done 12/08/17   Max Sane, MD  senna-docusate (SENOKOT-S) 8.6-50 MG tablet Take 1 tablet by mouth 2 (two) times daily. Patient not taking: Reported on 11/13/2017 06/21/16   [name], MD  tiotropium (SPIRIVA HANDIHALER) 18 MCG inhalation capsule Place 1 capsule (18 mcg total) into inhaler and inhale daily.  Patient not taking: Reported on 11/13/2017 09/24/17   Loletha Grayer, MD    Allergies Patient has no known allergies.  Family History  Problem Relation Age of Onset  . Rheum arthritis Neg Hx   . Osteoarthritis Neg Hx   . Asthma Neg Hx   . Diabetes Neg Hx   . Cancer Neg Hx     Social History Social History   Tobacco Use  . Smoking status: Former Smoker    Types: Cigarettes    Last attempt to quit: 08/30/2015    Years since quitting: 2.3  . Smokeless tobacco: Never Used  Substance Use Topics  . Alcohol use: No    Frequency: Never  . Drug use: No    Review of Systems  Constitutional: + fever, generalized weakness Eyes: Negative for visual changes. ENT: Negative for sore throat. Neck: No neck pain  Cardiovascular: Negative for chest pain. Respiratory: + shortness of breath and cough Gastrointestinal: Negative for abdominal pain. + vomiting and diarrhea. Genitourinary: Negative for dysuria. Musculoskeletal: Negative for back pain. Skin: Negative for rash. Neurological: Negative for headaches, weakness or  numbness. Psych: No SI or HI  ____________________________________________   PHYSICAL EXAM:  VITAL SIGNS: ED Triage Vitals  Enc Vitals Group     BP 12/30/17 1515 (!) 153/57     Pulse Rate 12/30/17 1515 (!) 105     Resp 12/30/17 1515 (!) 33     Temp 12/30/17 1521 98.2 F (36.8 C)     Temp Source 12/30/17 1521 Oral     SpO2 12/30/17 1510 (S) (!) 84 %     Weight --      Height --      Head Circumference --      Peak Flow --      Pain Score --      Pain Loc --      Pain Edu? --      Excl. in [place]? --     Constitutional: Alert and oriented, tachypneic, mild respiratory distress.  HEENT:      Head: Normocephalic and atraumatic.         Eyes: Conjunctivae are normal. Sclera is non-icteric.       Mouth/Throat: Mucous membranes are dry.       Neck: Supple with no signs of meningismus. Cardiovascular: Tachycardic with regular rhythm.  No murmurs, gallops, or rubs. 2+ symmetrical distal pulses are present in all extremities. No JVD. Respiratory: Patient is tachypneic, satting 89-90% on 4 L nasal cannula, severely diminished air movement bilaterally with faint expiratory wheezes Gastrointestinal: Soft, non tender, and non distended with positive bowel sounds. No rebound or guarding. Musculoskeletal: Nontender with normal range of motion in all extremities. No edema, cyanosis, or erythema of extremities. Neurologic: Normal speech and language. Face is symmetric. Moving all extremities. No gross focal neurologic deficits are appreciated. Skin: Skin is warm, dry and intact. No rash noted. Psychiatric: Mood and affect are normal. Speech and behavior are normal.  ____________________________________________   LABS (all labs ordered are listed, but only abnormal results are displayed)  Labs Reviewed  GLUCOSE, CAPILLARY - Abnormal; Notable for the following components:      Result Value   Glucose-Capillary 116 (*)    All other components within normal limits  CBC WITH  DIFFERENTIAL/PLATELET - Abnormal; Notable for the following components:   WBC 16.3 (*)    RBC 3.69 (*)    Hemoglobin 10.5 (*)    HCT 31.8 (*)    RDW 18.2 (*)  Neutro Abs 14.3 (*)    Lymphs Abs 0.8 (*)    All other components within normal limits  BASIC METABOLIC PANEL - Abnormal; Notable for the following components:   Chloride 100 (*)    Glucose, Bld 122 (*)    BUN 21 (*)    Creatinine, Ser 1.48 (*)    GFR calc non Af Amer 43 (*)    GFR calc Af Amer 50 (*)    All other components within normal limits  TROPONIN I - Abnormal; Notable for the following components:   Troponin I 0.03 (*)    All other components within normal limits  CULTURE, BLOOD (ROUTINE X 2)  CULTURE, BLOOD (ROUTINE X 2)  URINALYSIS, COMPLETE (UACMP) WITH MICROSCOPIC  INFLUENZA PANEL BY PCR (TYPE A & B)  LACTIC ACID, PLASMA   ____________________________________________  EKG  ED ECG REPORT I, Rudene Re, the attending physician, personally viewed and interpreted this ECG.  Sinus tachycardia, rate of 103, normal intervals, normal axis, no ST elevations or depressions.  ____________________________________________  [id number]  Interpreted by me: CXR: LLL infiltrate   Interpretation by Radiologist:  Dg Chest 2 View  Result Date: 12/30/2017 CLINICAL DATA:  History of pneumonia. EXAM: CHEST  2 VIEW COMPARISON:  11/13/2017. FINDINGS: Mediastinum and hilar structures normal. Bibasilar atelectasis again noted. Mild left base infiltrate again noted. Left pleural effusion again noted. IMPRESSION: 1. Persistent bibasilar atelectasis, left base infiltrate, and left pleural effusion. No significant interim improvement. 2.  Stable cardiomegaly. Electronically Signed   By: [name]  Register   On: 12/30/2017 16:14     ____________________________________________   PROCEDURES  Procedure(s) performed: None Procedures Critical Care performed: yes  CRITICAL CARE Performed by: Rudene Re  ?  Total  critical care time: 40 min  Critical care time was exclusive of separately billable procedures and treating other patients.  Critical care was necessary to treat or prevent imminent or life-threatening deterioration.  Critical care was time spent personally by me on the following activities: development of treatment plan with patient and/or surrogate as well as nursing, discussions with consultants, evaluation of patient's response to treatment, examination of patient, obtaining history from patient or surrogate, ordering and performing treatments and interventions, ordering and review of laboratory studies, ordering and review of radiographic studies, pulse oximetry and re-evaluation of patient's condition.  ____________________________________________   INITIAL IMPRESSION / ASSESSMENT AND PLAN / ED COURSE   83 y.o. male with a history of CAD, diabetes, COPD, hypertension, hyperlipidemia, iron deficiency anemia, and peripheral vascular disease who presents for evaluation of fever, shortness of breath, generalized weakness, cough, and one episode of vomiting and diarrhea. Patient looks dry and exam, mild respiratory distress, hypoxic on 4 L with severely diminished air movement. Chest x-ray concerning for persistent left lower lobe infiltrate with a recent admission to the hospital concerning for HCAP. Patient is tachycardic, tachypnea, with a white count of 16 meeting sepsis criteria. We'll start patient on cefepime and vancomycin, will give IV fluids. Flu swab is pending. Patient will receive DuoNeb abs and steroids for COPD exacerbation. Anticipate admission      As part of my medical decision making, I reviewed the following data within the [place] notes reviewed and incorporated, Labs reviewed , EKG interpreted , Old chart reviewed, Radiograph reviewed , Discussed with admitting physician , Notes from prior ED visits and [place] Controlled Substance  Database    Pertinent labs & imaging results that were available during my care of the patient  were reviewed by me and considered in my medical decision making (see chart for details).    ____________________________________________   FINAL CLINICAL IMPRESSION(S) / ED DIAGNOSES  Final diagnoses:  HCAP (healthcare-associated pneumonia)  Sepsis, due to unspecified organism [facility])      NEW MEDICATIONS STARTED DURING THIS VISIT:  ED Discharge Orders    None       Note:  This document was prepared using Dragon voice recognition software and may include unintentional dictation errors.    Alfred Levins, [place], MD 12/30/17 [phone]

## 2017-12-31 LAB — CBC
HEMATOCRIT: 28.8 % — AB (ref 40.0–52.0)
HEMOGLOBIN: 9.4 g/dL — AB (ref 13.0–18.0)
MCH: 28.2 pg (ref 26.0–34.0)
MCHC: 32.7 g/dL (ref 32.0–36.0)
MCV: 86.2 fL (ref 80.0–100.0)
Platelets: 214 10*3/uL (ref 150–440)
RBC: 3.35 MIL/uL — ABNORMAL LOW (ref 4.40–5.90)
RDW: 18.8 % — AB (ref 11.5–14.5)
WBC: 12.8 10*3/uL — ABNORMAL HIGH (ref 3.8–10.6)

## 2017-12-31 LAB — BASIC METABOLIC PANEL
ANION GAP: 15 (ref 5–15)
BUN: 30 mg/dL — AB (ref 6–20)
CO2: 23 mmol/L (ref 22–32)
Calcium: 8.2 mg/dL — ABNORMAL LOW (ref 8.9–10.3)
Chloride: 100 mmol/L — ABNORMAL LOW (ref 101–111)
Creatinine, Ser: 1.78 mg/dL — ABNORMAL HIGH (ref 0.61–1.24)
GFR calc Af Amer: 40 mL/min — ABNORMAL LOW (ref >60)
GFR calc non Af Amer: 34 mL/min — ABNORMAL LOW (ref >60)
Glucose, Bld: 331 mg/dL — ABNORMAL HIGH (ref 65–99)
POTASSIUM: 4.1 mmol/L (ref 3.5–5.1)
Sodium: 138 mmol/L (ref 135–145)

## 2017-12-31 LAB — URINALYSIS, COMPLETE (UACMP) WITH MICROSCOPIC
BACTERIA UA: NONE SEEN
BILIRUBIN URINE: NEGATIVE
Glucose, UA: 150 mg/dL — AB
Hgb urine dipstick: NEGATIVE
Ketones, ur: 5 mg/dL — AB
LEUKOCYTES UA: NEGATIVE
Nitrite: NEGATIVE
PH: 5 (ref 5.0–8.0)
Protein, ur: 30 mg/dL — AB
SPECIFIC GRAVITY, URINE: 1.015 (ref 1.005–1.030)
SQUAMOUS EPITHELIAL / LPF: NONE SEEN

## 2017-12-31 LAB — MRSA PCR SCREENING: MRSA by PCR: NEGATIVE

## 2017-12-31 LAB — GLUCOSE, CAPILLARY
GLUCOSE-CAPILLARY: 328 mg/dL — AB (ref 65–99)
GLUCOSE-CAPILLARY: 229 mg/dL — AB (ref 65–99)
Glucose-Capillary: 269 mg/dL — ABNORMAL HIGH (ref 65–99)
Glucose-Capillary: 280 mg/dL — ABNORMAL HIGH (ref 65–99)

## 2017-12-31 LAB — INFLUENZA PANEL BY PCR (TYPE A & B)
Influenza A By PCR: NEGATIVE
Influenza B By PCR: NEGATIVE

## 2017-12-31 MED ORDER — METOPROLOL SUCCINATE ER 50 MG PO TB24
100.0000 mg | ORAL_TABLET | Freq: Every day | ORAL | Status: DC
  Administered 2017-12-31: 100 mg via ORAL
  Filled 2017-12-31: qty 2

## 2017-12-31 MED ORDER — IPRATROPIUM-ALBUTEROL 0.5-2.5 (3) MG/3ML IN SOLN
3.0000 mL | Freq: Four times a day (QID) | RESPIRATORY_TRACT | Status: DC
  Administered 2017-12-31 (×2): 3 mL via RESPIRATORY_TRACT
  Filled 2017-12-31 (×2): qty 3

## 2017-12-31 MED ORDER — PREDNISONE 50 MG PO TABS
50.0000 mg | ORAL_TABLET | Freq: Every day | ORAL | Status: DC
Start: 2018-01-01 — End: 2018-01-01
  Administered 2018-01-01: 08:00:00 50 mg via ORAL
  Filled 2017-12-31: qty 1

## 2017-12-31 NOTE — Evaluation (Signed)
Physical Therapy Evaluation Patient Details Name: Brett [name] MRN: [id number] DOB: 01-28-1935 Today's Date: 12/31/2017   History of Present Illness  Brett [name]  is a 83 y.o. male admitted for sepsis s/p c/o a headache and presenting with tachycardia in ED.  PMH includes B cell lymphoma, CAD, DM, COPD, Htn, HLD, iron deficiency anemia, peptic ulcer disease, peripheral vascular disease- had admission twice in last 2 months for respiratory reasons.  Clinical Impression  Pt in bed upon PT arrival.  Pt is very HOH which made communication difficult but information was obtained by speaking loudly with a lower tone into pt's R ear.  Pt reported no pain and presented with WNL strength of UE/LE.  Pt reported no sensation loss.  Pt able to perform bed mobility with use of bed rail and slightly increased amount of time.  He is insistent that he does not want to use an AD due to fear of becoming dependent on it but pt presents with a flexed posture and uses IV pole for support during STS transfer and gait training.  PT educated pt concerning proper body mechanics during transfers and importance of posture in fall prevention.  Pt was able to ascend/descend 3 steps with use of hand rail and descending steps backwards.  PT maintained a close SBA during stair training.  Pt desat into the 80% range following 2-3 min of activity and recovers following 2-3 min of PT guided pursed lip breathing.  Pt will continue to benefit from skilled PT with focus on balance, tolerance to activity and proper use of AD for fall prevention.    Follow Up Recommendations Home health PT(Pt would also benefit from [facility] but would need transportation.)    Equipment Recommendations       Recommendations for Other Services       Precautions / Restrictions Precautions Precautions: Fall Restrictions Weight Bearing Restrictions: No      Mobility  Bed Mobility Overal bed mobility: Independent                 Transfers Overall transfer level: Needs assistance Equipment used: 1 person hand held assist Transfers: Sit to/from Stand Sit to Stand: Supervision         General transfer comment: Pt able to perform STS with hand held assist for stability but pt was able to initiate STS on his own.  Ambulation/Gait Ambulation/Gait assistance: Supervision Ambulation Distance (Feet): 150 Feet       Gait velocity interpretation: at or above normal speed for age/gender General Gait Details: Pt demonstrated a reciprocal gait pattern pushing IV and demonstrating moderate foot clearance and hip and knee flexion.  Pt desat to 83% during ambulation and required a 2 min rest break to recover.  Pt presented with a flexed posture and mild instability indicative of fall risk.  PT attempted to educate pt concerning gait deviations and use of AD related to fall risk.  Pt is very HOH so amount of information retained is questionable.  Stairs Stairs: Yes Stairs assistance: Supervision Stair Management: One rail Right;Alternating pattern;Backwards Number of Stairs: 3 General stair comments: Pt ascended/descended 3 steps using hand rail with a step over step gait pattern and close SBA.  Pt descended steps in reverse stating that this is not how he normally does it but that he feels more comfortable doing it this way today.  Wheelchair Mobility    Modified Rankin (Stroke Patients Only)       Balance Overall balance  assessment: Modified Independent                                           Pertinent Vitals/Pain Pain Assessment: No/denies pain    Home Living Family/patient expects to be discharged to:: Private residence Living Arrangements: Alone Available Help at Discharge: Family;Neighbor;Available 24 hours/day Type of Home: House Home Access: Stairs to enter Entrance Stairs-Rails: Can reach both Entrance Stairs-Number of Steps: 3 Home Layout: One level Home Equipment:  Grab bars - tub/shower      Prior Function Level of Independence: Independent         Comments: Pt states that he does not want to use a RW because he does not want to become dependent on it.     Hand Dominance        Extremity/Trunk Assessment   Upper Extremity Assessment Upper Extremity Assessment: Overall WFL for tasks assessed    Lower Extremity Assessment Lower Extremity Assessment: Overall WFL for tasks assessed    Cervical / Trunk Assessment Cervical / Trunk Assessment: Normal  Communication   Communication: HOH  Cognition Arousal/Alertness: Awake/alert Behavior During Therapy: WFL for tasks assessed/performed Overall Cognitive Status: Within Functional Limits for tasks assessed                                        General Comments      Exercises     Assessment/Plan    PT Assessment Patient needs continued PT services  PT Problem List Decreased mobility;Decreased balance;Decreased knowledge of use of DME       PT Treatment Interventions DME instruction;Therapeutic activities;Gait training;Therapeutic exercise;Stair training;Balance training;Functional mobility training;Patient/family education    PT Goals (Current goals can be found in the Care Plan section)  Acute Rehab PT Goals Patient Stated Goal: to return home and to be able to walk as much as possible. PT Goal Formulation: With patient Time For Goal Achievement: 01/14/18 Potential to Achieve Goals: Good    Frequency Min 2X/week   Barriers to discharge        Co-evaluation               AM-PAC PT "6 Clicks" Daily Activity  Outcome Measure Difficulty turning over in bed (including adjusting bedclothes, sheets and blankets)?: A Little Difficulty moving from lying on back to sitting on the side of the bed? : A Little Difficulty sitting down on and standing up from a chair with arms (e.g., wheelchair, bedside commode, etc,.)?: A Little Help needed moving to and  from a bed to chair (including a wheelchair)?: A Little Help needed walking in hospital room?: A Little Help needed climbing 3-5 steps with a railing? : A Lot 6 Click Score: 17    End of Session Equipment Utilized During Treatment: Gait belt;Oxygen Activity Tolerance: Treatment limited secondary to medical complications (Comment)(O2 sats drop into 80% range following 2-3 min of activity.) Patient left: in chair;with call bell/phone within reach;with chair alarm set Nurse Communication: Mobility status PT Visit Diagnosis: Unsteadiness on feet (R26.81)    Time: 1440-1500 PT Time Calculation (min) (ACUTE ONLY): 20 min   Charges:   PT Evaluation $PT Eval Low Complexity: 1 Low PT Treatments $Therapeutic Activity: 8-22 mins   PT G Codes:   PT G-Codes **NOT FOR INPATIENT CLASS** Functional Assessment Tool  Used: AM-PAC 6 Clicks Basic Mobility   Roxanne Gates, PT, DPT   Roxanne Gates 12/31/2017, 3:11 PM

## 2017-12-31 NOTE — Progress Notes (Signed)
[place] [facility] [place] NAME: Brett [name]    MR#:  [id number]  DATE OF BIRTH:  Dec 10, 1934  SUBJECTIVE:  CHIEF COMPLAINT:   Chief Complaint  Patient presents with  . Fever  . Chills     Admitted twice in last 2 months for respiratory symptoms. Came with shortness of breath and fever. Feels slightly better today.  REVIEW OF SYSTEMS:   CONSTITUTIONAL: No fever, positive for fatigue or weakness.  EYES: No blurred or double vision.  EARS, NOSE, AND THROAT: No tinnitus or ear pain.  RESPIRATORY: Positive for cough, shortness of breath, wheezing , no hemoptysis.  CARDIOVASCULAR: No chest pain, orthopnea, edema.  GASTROINTESTINAL: No nausea, vomiting, diarrhea or abdominal pain.  GENITOURINARY: No dysuria, hematuria.  ENDOCRINE: No polyuria, nocturia,  HEMATOLOGY: No anemia, easy bruising or bleeding SKIN: No rash or lesion. MUSCULOSKELETAL: No joint pain or arthritis.   NEUROLOGIC: No tingling, numbness, weakness.  PSYCHIATRY: No anxiety or depression.   ROS  DRUG ALLERGIES:  No Known Allergies  VITALS:  Blood pressure (!) 127/47, pulse (!) 104, temperature (!) 97.5 F (36.4 C), temperature source Oral, resp. rate (!) 22, height 5\' 10"  (1.778 m), weight 87.1 kg (192 lb 1.6 oz), SpO2 96 %.  PHYSICAL EXAMINATION:  GENERAL:  83 y.o.-year-old patient lying in the bed with no acute distress.  EYES: Pupils equal, round, reactive to light and accommodation. No scleral icterus. Extraocular muscles intact.  HEENT: Head atraumatic, normocephalic. Oropharynx and nasopharynx clear.  NECK:  Supple, no jugular venous distention. No thyroid enlargement, no tenderness.  LUNGS: Normal breath sounds bilaterally, no wheezing, some crepitation. No use of accessory muscles of respiration.  CARDIOVASCULAR: S1, S2 normal. No murmurs, rubs, or gallops.  ABDOMEN: Soft, nontender, nondistended. Bowel sounds present. No organomegaly or mass.  EXTREMITIES: No  pedal edema, cyanosis, or clubbing.  NEUROLOGIC: Cranial nerves II through XII are intact. Muscle strength 4-5/5 in all extremities. Sensation intact. Gait not checked.  PSYCHIATRIC: The patient is alert and oriented x 3.  SKIN: No obvious rash, lesion, or ulcer.   Physical Exam LABORATORY PANEL:   CBC Recent Labs  Lab 12/31/17 0525  WBC 12.8*  HGB 9.4*  HCT 28.8*  PLT 214   ------------------------------------------------------------------------------------------------------------------  Chemistries  Recent Labs  Lab 12/31/17 0525  NA 138  K 4.1  CL 100*  CO2 23  GLUCOSE 331*  BUN 30*  CREATININE 1.78*  CALCIUM 8.2*   ------------------------------------------------------------------------------------------------------------------  Cardiac Enzymes Recent Labs  Lab 12/30/17 1520  TROPONINI 0.03*   ------------------------------------------------------------------------------------------------------------------  RADIOLOGY:  Dg Chest 2 View  Result Date: 12/30/2017 CLINICAL DATA:  History of pneumonia. EXAM: CHEST  2 VIEW COMPARISON:  11/13/2017. FINDINGS: Mediastinum and hilar structures normal. Bibasilar atelectasis again noted. Mild left base infiltrate again noted. Left pleural effusion again noted. IMPRESSION: 1. Persistent bibasilar atelectasis, left base infiltrate, and left pleural effusion. No significant interim improvement. 2.  Stable cardiomegaly. Electronically Signed   By: [place]   On: 12/30/2017 16:14    ASSESSMENT AND PLAN:   Active Problems:   Sepsis ([place])   HCAP (healthcare-associated pneumonia)   Acute respiratory failure ([place])    * Sepsis   Evident by fever, tachycardia, hypoxia, elevated blood blood cell count.   Broad-spectrum antibiotics for now, IV fluids.   Cultures are sent by ER.   Cultures negative so far, MRSA PCR negative.  * Healthcare associated pneumonia   IV Vanco and cefepime for now. Influenza test  is  negative, MRSA PCR negative.   We will stop vancomycin and continue on the cefepime.  * COPD exacerbation   IV and inhaled steroid, nebulizer therapy.   Supplemental oxygen.   Improving, change steroids to oral.  * Acute respiratory failure    Continue supplemental oxygen and try to taper  *  acute worsening on CKD stage III   Monitor.   Hold Lasix, continue IV fluids. Stop vancomycin.  * Coronary artery disease   Continue atorvastatin, Plavix.  * History of hypertension    due to tachycardia and stable blood pressure now resume metoprolol, hold others.  * DM   Hold oral meds, keep on ISS>   Blood sugar is high, likely due to IV steroid, continue monitor.   PT evaluation.  All the records are reviewed and case discussed with Care Management/Social Workerr. Management plans discussed with the patient, family and they are in agreement.  CODE STATUS: full code  TOTAL TIME TAKING CARE OF THIS PATIENT:  35 minutes.     POSSIBLE D/C IN 1-2 DAYS, DEPENDING ON CLINICAL CONDITION.   Vaughan Basta M.D on 12/31/2017   Between 7am to 6pm - Pager - [phone]  After 6pm go to [email or web address] - password [email or web address] [facility]  Office  [phone]  CC: Primary care physician; Perrin Maltese, MD  Note: This dictation was prepared with Dragon dictation along with smaller phrase technology. Any transcriptional errors that result from this process are unintentional.

## 2018-01-01 LAB — BASIC METABOLIC PANEL
ANION GAP: 10 (ref 5–15)
BUN: 46 mg/dL — ABNORMAL HIGH (ref 6–20)
CALCIUM: 8.2 mg/dL — AB (ref 8.9–10.3)
CO2: 23 mmol/L (ref 22–32)
Chloride: 106 mmol/L (ref 101–111)
Creatinine, Ser: 1.54 mg/dL — ABNORMAL HIGH (ref 0.61–1.24)
GFR calc non Af Amer: 41 mL/min — ABNORMAL LOW (ref >60)
GFR, EST AFRICAN AMERICAN: 47 mL/min — AB (ref >60)
Glucose, Bld: 242 mg/dL — ABNORMAL HIGH (ref 65–99)
Potassium: 4.3 mmol/L (ref 3.5–5.1)
Sodium: 139 mmol/L (ref 135–145)

## 2018-01-01 LAB — CBC
HEMATOCRIT: 27.8 % — AB (ref 40.0–52.0)
HEMOGLOBIN: 8.9 g/dL — AB (ref 13.0–18.0)
MCH: 27.7 pg (ref 26.0–34.0)
MCHC: 32.1 g/dL (ref 32.0–36.0)
MCV: 86.3 fL (ref 80.0–100.0)
Platelets: 235 10*3/uL (ref 150–440)
RBC: 3.23 MIL/uL — ABNORMAL LOW (ref 4.40–5.90)
RDW: 18.6 % — AB (ref 11.5–14.5)
WBC: 17.8 10*3/uL — AB (ref 3.8–10.6)

## 2018-01-01 LAB — GLUCOSE, CAPILLARY: Glucose-Capillary: 210 mg/dL — ABNORMAL HIGH (ref 65–99)

## 2018-01-01 MED ORDER — IPRATROPIUM-ALBUTEROL 0.5-2.5 (3) MG/3ML IN SOLN
3.0000 mL | Freq: Three times a day (TID) | RESPIRATORY_TRACT | Status: DC
  Administered 2018-01-01: 08:00:00 3 mL via RESPIRATORY_TRACT
  Filled 2018-01-01: qty 3

## 2018-01-01 MED ORDER — CEFUROXIME AXETIL 250 MG PO TABS: 250.0000 mg | ORAL_TABLET | Freq: Two times a day (BID) | ORAL | 0 refills | Status: AC

## 2018-01-01 MED ORDER — FUROSEMIDE 40 MG PO TABS: 20.0000 mg | ORAL_TABLET | Freq: Every day | ORAL | 0 refills | Status: DC

## 2018-01-01 MED ORDER — METOPROLOL SUCCINATE ER 50 MG PO TB24
200.0000 mg | ORAL_TABLET | Freq: Every day | ORAL | Status: DC
  Administered 2018-01-01: 200 mg via ORAL
  Filled 2018-01-01: qty 4

## 2018-01-01 MED ORDER — IPRATROPIUM-ALBUTEROL 0.5-2.5 (3) MG/3ML IN SOLN: 3.0000 mL | Freq: Two times a day (BID) | RESPIRATORY_TRACT | Status: DC

## 2018-01-01 MED ORDER — IPRATROPIUM-ALBUTEROL 0.5-2.5 (3) MG/3ML IN SOLN: 3.0000 mL | Freq: Four times a day (QID) | RESPIRATORY_TRACT | Status: DC | PRN

## 2018-01-01 MED ORDER — PREDNISONE 10 MG (21) PO TBPK: ORAL_TABLET | ORAL | 0 refills | Status: DC

## 2018-01-01 NOTE — Progress Notes (Signed)
Patient is being discharge home today with home health , patient remains alert and oriented denies any pain vss, mood calm. Discharge instruction provided, Iv removed

## 2018-01-01 NOTE — Progress Notes (Signed)
Patient discharged home as per order, discharge instruction provided, iv removed tele removed . Pt dischargesd home

## 2018-01-01 NOTE — Plan of Care (Signed)
VSS, free of falls during shift.  Denies pain, no complaints overnight.  Bed in low position, call bell within reach.  WCTM.

## 2018-01-01 NOTE — Care Management Note (Signed)
Case Management Note  Patient Details  Name: Brett [name] MRN: [id number] Date of Birth: 12-18-1934  Subjective/Objective:     Referral to [facility] [place] for HH=PT, Aide. Discussed discharge planning with Dr Molli Hazard. No HH-RN ordered. Brett [name] has chronic 02 and a portable tank.                Action/Plan:   Expected Discharge Date:  01/01/18               Expected Discharge Plan:  [place]  In-House Referral:     Discharge planning Services  CM Consult  Post Acute Care Choice:  Home Health Choice offered to:  Patient  DME Arranged:    DME Agency:     HH Arranged:  PT, Nurse's Aide [place] Agency:  [facility]  Status of Service:  Completed, signed off  If discussed at [place] of Stay Meetings, dates discussed:    Additional Comments:  Brett [name] A, RN 01/01/2018, 9:35 AM

## 2018-01-04 LAB — CULTURE, BLOOD (ROUTINE X 2)
CULTURE: NO GROWTH
Culture: NO GROWTH
SPECIAL REQUESTS: ADEQUATE
SPECIAL REQUESTS: ADEQUATE

## 2018-01-09 NOTE — Discharge Summary (Signed)
[place] at [place] NAME: Brett [name]    MR#:  [id number]  DATE OF BIRTH:  12-25-1934  DATE OF ADMISSION:  12/30/2017 ADMITTING PHYSICIAN: Vaughan Basta, MD  DATE OF DISCHARGE: 01/01/2018 11:35 AM  PRIMARY CARE PHYSICIAN: Perrin Maltese, MD    ADMISSION DIAGNOSIS:  HCAP (healthcare-associated pneumonia) [J18.9] Sepsis, due to unspecified organism ([place]) [A41.9]  DISCHARGE DIAGNOSIS:  Active Problems:   Sepsis ([place])   HCAP (healthcare-associated pneumonia)   Acute respiratory failure ([place])   SECONDARY DIAGNOSIS:   Past Medical History:  Diagnosis Date  . Anemia   . Aortic regurgitation   . B-cell lymphoma ([place]) 2009   DX AT [facility]  . Bronchitis   . CAD (coronary artery disease)   . Carotid stenosis   . Diabetes mellitus without complication ([place])   . Diabetes mellitus, type 2 ([place])   . Emphysema of lung ([place])   . Essential hypertension   . History of chemotherapy   . Hyperlipidemia   . Hypertension   . IDA (iron deficiency anemia)   . Leg edema   . Meralgia paraesthetica   . Mitral regurgitation   . PUD (peptic ulcer disease)   . PVD (peripheral vascular disease) ([place])   . Tobacco abuse     HOSPITAL COURSE:   *Sepsis Evident by fever, tachycardia, hypoxia, elevated blood blood cell count. Broad-spectrum antibiotics for now, IV fluids. Cultures are sent by ER.   Cultures negative so far, MRSA PCR negative.  *Healthcare associated pneumonia IV Vanco and cefepime for now. Influenza test is negative, MRSA PCR negative.   We will stop vancomycin and continue on the cefepime.  improved.  *COPDexacerbation IV and inhaled steroid, nebulizer therapy. Supplemental oxygen.   Improving, change steroids to oral.  *Acute respiratory failure Continue supplemental oxygen and try to taper  * acute worsening on CKDstage III Monitor.   Hold Lasix, continue IV fluids. Stop  vancomycin.  *Coronary artery disease Continue atorvastatin, Plavix.  *History of hypertension  due to tachycardia and stable blood pressure now resume metoprolol, hold others.  * DM Hold oral meds, keep on ISS>   Blood sugar is high, likely due to IV steroid, continue monitor.   DISCHARGE CONDITIONS:   Stable.  CONSULTS OBTAINED:    DRUG ALLERGIES:  No Known Allergies  DISCHARGE MEDICATIONS:   Allergies as of 01/01/2018   No Known Allergies     Medication List    TAKE these medications   albuterol 108 (90 Base) MCG/ACT inhaler Commonly known as:  PROVENTIL HFA;VENTOLIN HFA Inhale 2 puffs into the lungs every 6 (six) hours as needed for wheezing or shortness of breath.   amLODipine 5 MG tablet Commonly known as:  NORVASC Take 5 mg by mouth daily.   atorvastatin 80 MG tablet Commonly known as:  LIPITOR Take 80 mg by mouth daily. What changed:  Another medication with the same name was removed. Continue taking this medication, and follow the directions you see here.   clopidogrel 75 MG tablet Commonly known as:  PLAVIX Take 75 mg by mouth daily.   eplerenone 25 MG tablet Commonly known as:  INSPRA Take 50 mg by mouth daily.   ergocalciferol 50000 units capsule Commonly known as:  VITAMIN D2 Take 50,000 Units by mouth once a week.   fluticasone 50 MCG/ACT nasal spray Commonly known as:  FLONASE Place 2 sprays into the nose daily as needed for allergies.   Fluticasone-Salmeterol 250-50 MCG/DOSE Aepb  Commonly known as:  ADVAIR DISKUS Inhale 1 puff into the lungs 2 times daily at 12 noon and 4 pm.   furosemide 40 MG tablet Commonly known as:  LASIX Take 0.5 tablets (20 mg total) by mouth daily. What changed:  how much to take   gabapentin 100 MG capsule Commonly known as:  NEURONTIN Take 1 capsule by mouth 2 (two) times daily.   glimepiride 1 MG tablet Commonly known as:  AMARYL Take 1 mg by mouth 2 (two) times daily.   hydrALAZINE 100  MG tablet Commonly known as:  APRESOLINE Take 100 mg by mouth 3 (three) times daily. What changed:  Another medication with the same name was removed. Continue taking this medication, and follow the directions you see here.   isosorbide dinitrate 30 MG tablet Commonly known as:  ISORDIL Take 30 mg by mouth daily.   metoprolol 200 MG 24 hr tablet Commonly known as:  TOPROL-XL Take 1 tablet (200 mg total) by mouth daily.   mometasone-formoterol 100-5 MCG/ACT Aero Commonly known as:  DULERA Inhale 2 puffs into the lungs 2 (two) times daily.   nicotine 7 mg/24hr patch Commonly known as:  NICODERM CQ - dosed in mg/24 hr Place 1 patch (7 mg total) onto the skin daily.   pantoprazole 40 MG tablet Commonly known as:  PROTONIX Take 40 mg by mouth daily.   predniSONE 10 MG (21) Tbpk tablet Commonly known as:  STERAPRED UNI-PAK 21 TAB Start at 60mg  taper by 10mg  until complete   senna-docusate 8.6-50 MG tablet Commonly known as:  Senokot-S Take 1 tablet by mouth 2 (two) times daily.   tiotropium 18 MCG inhalation capsule Commonly known as:  SPIRIVA HANDIHALER Place 1 capsule (18 mcg total) into inhaler and inhale daily.     ASK your doctor about these medications   cefUROXime 250 MG tablet Commonly known as:  CEFTIN Take 1 tablet (250 mg total) by mouth 2 (two) times daily for 5 days. Ask about: Should I take this medication?        DISCHARGE INSTRUCTIONS:    Follow with PCP in 1 week,  If you experience worsening of your admission symptoms, develop shortness of breath, life threatening emergency, suicidal or homicidal thoughts you must seek medical attention immediately by calling 911 or calling your MD immediately  if symptoms less severe.  You Must read complete instructions/literature along with all the possible adverse reactions/side effects for all the Medicines you take and that have been prescribed to you. Take any new Medicines after you have completely understood  and accept all the possible adverse reactions/side effects.   Please note  You were cared for by a hospitalist during your hospital stay. If you have any questions about your discharge medications or the care you received while you were in the hospital after you are discharged, you can call the unit and asked to speak with the hospitalist on call if the hospitalist that took care of you is not available. Once you are discharged, your primary care physician will handle any further medical issues. Please note that NO REFILLS for any discharge medications will be authorized once you are discharged, as it is imperative that you return to your primary care physician (or establish a relationship with a primary care physician if you do not have one) for your aftercare needs so that they can reassess your need for medications and monitor your lab values.    Today   CHIEF COMPLAINT:   Chief  Complaint  Patient presents with  . Fever  . Chills    HISTORY OF PRESENT ILLNESS:  Brett [name]  is a 83 y.o. male with a known history of B cell lymphoma, CAD, DM, COPD, Htn, HLD, iron deficiency anemia, peptic ulcer disease, peripheral vascular disease- had admission twice in last 2 months for respiratory reasons. Since he last 2 days he is feeling worsening shortness of breath and last night he had significant shortness of breath with fever and chills, also had episode of nausea and 1-2 loose bowel movement last night but this morning has bowel movement was normal. In ER he is noted to have tachycardia, elevated white blood cell count, infiltrate on his chest x-ray. He took 2 Tylenol tablets this morning before coming to ER. He also had complain of headache, both arm pains, back pain, chest pain. Influenza test is seen and as per ER physician, but in the lab the influenza checking machine is broken so it will take time to get the results back. Meanwhile ER physician started him on treatment for healthcare  associated pneumonia and given her admission to hospitalist team.   VITAL SIGNS:  Blood pressure (!) 161/69, pulse (!) 102, temperature (!) 97.4 F (36.3 C), temperature source Oral, resp. rate (!) 21, height 5\' 10"  (1.778 m), weight 87.1 kg (192 lb 1.6 oz), SpO2 95 %.  I/O:  No intake or output data in the 24 hours ending 01/09/18 0726  PHYSICAL EXAMINATION:   GENERAL:  83 y.o.-year-old patient lying in the bed with no acute distress.  EYES: Pupils equal, round, reactive to light and accommodation. No scleral icterus. Extraocular muscles intact.  HEENT: Head atraumatic, normocephalic. Oropharynx and nasopharynx clear.  NECK:  Supple, no jugular venous distention. No thyroid enlargement, no tenderness.  LUNGS: Normal breath sounds bilaterally, no wheezing, some crepitation. No use of accessory muscles of respiration.  CARDIOVASCULAR: S1, S2 normal. No murmurs, rubs, or gallops.  ABDOMEN: Soft, nontender, nondistended. Bowel sounds present. No organomegaly or mass.  EXTREMITIES: No pedal edema, cyanosis, or clubbing.  NEUROLOGIC: Cranial nerves II through XII are intact. Muscle strength 4-5/5 in all extremities. Sensation intact. Gait not checked.  PSYCHIATRIC: The patient is alert and oriented x 3.  SKIN: No obvious rash, lesion, or ulcer.    DATA REVIEW:   CBC No results for input(s): WBC, HGB, HCT, PLT in the last 168 hours.  Chemistries  No results for input(s): NA, K, CL, CO2, GLUCOSE, BUN, CREATININE, CALCIUM, MG, AST, ALT, ALKPHOS, BILITOT in the last 168 hours.  Invalid input(s): GFRCGP  Cardiac Enzymes No results for input(s): TROPONINI in the last 168 hours.  Microbiology Results  Results for orders placed or performed during the hospital encounter of 12/30/17  Blood culture (routine x 2)     Status: None   Collection Time: 12/30/17  3:20 PM  Result Value Ref Range Status   Specimen Description BLOOD RIGHT ANTECUBITAL  Final   Special Requests   Final    BOTTLES  DRAWN AEROBIC AND ANAEROBIC Blood Culture adequate volume   Culture   Final    NO GROWTH 5 DAYS Performed at [facility], [address], [place], [place] [address]    Report Status 01/04/2018 FINAL  Final  Blood culture (routine x 2)     Status: None   Collection Time: 12/30/17  4:38 PM  Result Value Ref Range Status   Specimen Description BLOOD BLOOD LEFT HAND  Final   Special Requests  Final    BOTTLES DRAWN AEROBIC AND ANAEROBIC Blood Culture adequate volume   Culture   Final    NO GROWTH 5 DAYS Performed at [facility], [address] [place], [place] [address]    Report Status 01/04/2018 FINAL  Final  MRSA PCR Screening     Status: None   Collection Time: 12/31/17  7:59 AM  Result Value Ref Range Status   MRSA by PCR NEGATIVE NEGATIVE Final    Comment:        The GeneXpert MRSA Assay (FDA approved for NASAL specimens only), is one component of a comprehensive MRSA colonization surveillance program. It is not intended to diagnose MRSA infection nor to guide or monitor treatment for MRSA infections. Performed at [facility], [address], [place], [place] [address]     RADIOLOGY:  No results found.  EKG:   Orders placed or performed during the hospital encounter of 12/30/17  . ED EKG  . ED EKG    Management plans discussed with the patient, family and they are in agreement.  CODE STATUS: Full code. Code Status History    Date Active Date Inactive Code Status Order ID Comments User Context   12/30/2017 18:15 01/01/2018 14:53 Full Code [id number]  Vaughan Basta, MD Inpatient   12/05/2017 21:46 12/08/2017 14:28 Full Code [id number]  Fritzi Mandes, MD Inpatient   11/13/2017 21:23 11/16/2017 17:04 Full Code [id number]  Idelle Crouch, MD Inpatient   09/22/2017 00:36 09/24/2017 18:37 Full Code [id number]  Gorden Harms, MD Inpatient   09/22/2017 00:33 09/22/2017 00:36 DNR [id number]  Gorden Harms, MD ED   12/02/2016 08:12  12/03/2016 20:09 Full Code [id number]  Vaughan Basta, MD Inpatient   07/11/2016 00:18 07/11/2016 17:07 Full Code [id number]  Lance Coon, MD Inpatient   06/18/2016 03:40 06/21/2016 18:44 Full Code [id number]  Saundra Shelling, MD ED      TOTAL TIME TAKING CARE OF THIS PATIENT: 35 minutes.    Vaughan Basta M.D on 01/09/2018 at 7:26 AM  Between 7am to 6pm - Pager - [phone]  After 6pm go to [email or web address] - password [email or web address] [facility]  Office  [phone]  CC: Primary care physician; Perrin Maltese, MD   Note: This dictation was prepared with Dragon dictation along with smaller phrase technology. Any transcriptional errors that result from this process are unintentional.

## 2018-01-20 ENCOUNTER — Other Ambulatory Visit: Payer: Self-pay | Admitting: Internal Medicine

## 2018-01-20 DIAGNOSIS — R221 Localized swelling, mass and lump, neck: Secondary | ICD-10-CM

## 2018-01-23 ENCOUNTER — Ambulatory Visit
Admission: RE | Admit: 2018-01-23 | Discharge: 2018-01-23 | Disposition: A | Discharge status: Home / Self Care | Payer: Medicare PPO | Source: Ambulatory Visit | Attending: Internal Medicine | Admitting: Internal Medicine

## 2018-01-23 DIAGNOSIS — R221 Localized swelling, mass and lump, neck: Secondary | ICD-10-CM | POA: Insufficient documentation

## 2018-04-04 ENCOUNTER — Ambulatory Visit
Admission: RE | Admit: 2018-04-04 | Discharge: 2018-04-04 | Disposition: A | Discharge status: Home / Self Care | Payer: Medicare PPO | Source: Ambulatory Visit | Attending: Internal Medicine | Admitting: Internal Medicine

## 2018-04-04 ENCOUNTER — Other Ambulatory Visit: Payer: Self-pay | Admitting: Internal Medicine

## 2018-04-04 DIAGNOSIS — M79604 Pain in right leg: Secondary | ICD-10-CM

## 2018-04-04 DIAGNOSIS — M7989 Other specified soft tissue disorders: Secondary | ICD-10-CM | POA: Diagnosis not present

## 2018-05-29 ENCOUNTER — Other Ambulatory Visit: Payer: Self-pay

## 2018-05-29 ENCOUNTER — Inpatient Hospital Stay
Admission: EM | Admit: 2018-05-29 | Discharge: 2018-05-31 | DRG: 291 | Disposition: A | Discharge status: Home Health | Payer: Medicare Other | Attending: Specialist | Admitting: Specialist

## 2018-05-29 ENCOUNTER — Encounter: Payer: Self-pay | Admitting: Emergency Medicine

## 2018-05-29 ENCOUNTER — Emergency Department: Payer: Medicare Other

## 2018-05-29 ENCOUNTER — Inpatient Hospital Stay (HOSPITAL_COMMUNITY)
Admit: 2018-05-29 | Discharge: 2018-05-29 | Disposition: A | Discharge status: Home / Self Care | Payer: Medicare Other | Attending: Internal Medicine | Admitting: Internal Medicine

## 2018-05-29 DIAGNOSIS — E1165 Type 2 diabetes mellitus with hyperglycemia: Secondary | ICD-10-CM | POA: Diagnosis not present

## 2018-05-29 DIAGNOSIS — Z87891 Personal history of nicotine dependence: Secondary | ICD-10-CM | POA: Diagnosis not present

## 2018-05-29 DIAGNOSIS — D509 Iron deficiency anemia, unspecified: Secondary | ICD-10-CM | POA: Diagnosis present

## 2018-05-29 DIAGNOSIS — E1151 Type 2 diabetes mellitus with diabetic peripheral angiopathy without gangrene: Secondary | ICD-10-CM | POA: Diagnosis present

## 2018-05-29 DIAGNOSIS — J440 Chronic obstructive pulmonary disease with acute lower respiratory infection: Secondary | ICD-10-CM | POA: Diagnosis not present

## 2018-05-29 DIAGNOSIS — I6529 Occlusion and stenosis of unspecified carotid artery: Secondary | ICD-10-CM | POA: Diagnosis present

## 2018-05-29 DIAGNOSIS — Z79899 Other long term (current) drug therapy: Secondary | ICD-10-CM | POA: Diagnosis not present

## 2018-05-29 DIAGNOSIS — J189 Pneumonia, unspecified organism: Secondary | ICD-10-CM | POA: Diagnosis present

## 2018-05-29 DIAGNOSIS — Z66 Do not resuscitate: Secondary | ICD-10-CM | POA: Diagnosis not present

## 2018-05-29 DIAGNOSIS — I080 Rheumatic disorders of both mitral and aortic valves: Secondary | ICD-10-CM | POA: Diagnosis present

## 2018-05-29 DIAGNOSIS — I509 Heart failure, unspecified: Secondary | ICD-10-CM

## 2018-05-29 DIAGNOSIS — I25110 Atherosclerotic heart disease of native coronary artery with unstable angina pectoris: Secondary | ICD-10-CM | POA: Diagnosis not present

## 2018-05-29 DIAGNOSIS — T380X5A Adverse effect of glucocorticoids and synthetic analogues, initial encounter: Secondary | ICD-10-CM | POA: Diagnosis not present

## 2018-05-29 DIAGNOSIS — J441 Chronic obstructive pulmonary disease with (acute) exacerbation: Secondary | ICD-10-CM | POA: Diagnosis present

## 2018-05-29 DIAGNOSIS — Z9221 Personal history of antineoplastic chemotherapy: Secondary | ICD-10-CM | POA: Diagnosis not present

## 2018-05-29 DIAGNOSIS — K219 Gastro-esophageal reflux disease without esophagitis: Secondary | ICD-10-CM | POA: Diagnosis not present

## 2018-05-29 DIAGNOSIS — I5033 Acute on chronic diastolic (congestive) heart failure: Secondary | ICD-10-CM | POA: Diagnosis not present

## 2018-05-29 DIAGNOSIS — I361 Nonrheumatic tricuspid (valve) insufficiency: Secondary | ICD-10-CM

## 2018-05-29 DIAGNOSIS — I110 Hypertensive heart disease with heart failure: Principal | ICD-10-CM | POA: Diagnosis present

## 2018-05-29 DIAGNOSIS — E785 Hyperlipidemia, unspecified: Secondary | ICD-10-CM | POA: Diagnosis not present

## 2018-05-29 DIAGNOSIS — Z8249 Family history of ischemic heart disease and other diseases of the circulatory system: Secondary | ICD-10-CM

## 2018-05-29 DIAGNOSIS — Z7902 Long term (current) use of antithrombotics/antiplatelets: Secondary | ICD-10-CM

## 2018-05-29 DIAGNOSIS — Z7984 Long term (current) use of oral hypoglycemic drugs: Secondary | ICD-10-CM

## 2018-05-29 DIAGNOSIS — Z8572 Personal history of non-Hodgkin lymphomas: Secondary | ICD-10-CM

## 2018-05-29 LAB — CBC WITH DIFFERENTIAL/PLATELET
BASOS PCT: 1 %
Basophils Absolute: 0.1 10*3/uL (ref 0–0.1)
Eosinophils Absolute: 0.2 10*3/uL (ref 0–0.7)
Eosinophils Relative: 3 %
HEMATOCRIT: 28.5 % — AB (ref 40.0–52.0)
HEMOGLOBIN: 9.6 g/dL — AB (ref 13.0–18.0)
Lymphocytes Relative: 11 %
Lymphs Abs: 0.8 10*3/uL — ABNORMAL LOW (ref 1.0–3.6)
MCH: 28.1 pg (ref 26.0–34.0)
MCHC: 33.6 g/dL (ref 32.0–36.0)
MCV: 83.8 fL (ref 80.0–100.0)
Monocytes Absolute: 0.9 10*3/uL (ref 0.2–1.0)
Monocytes Relative: 11 %
NEUTROS ABS: 6 10*3/uL (ref 1.4–6.5)
NEUTROS PCT: 74 %
Platelets: 204 10*3/uL (ref 150–440)
RBC: 3.4 MIL/uL — AB (ref 4.40–5.90)
RDW: 16.4 % — ABNORMAL HIGH (ref 11.5–14.5)
WBC: 8 10*3/uL (ref 3.8–10.6)

## 2018-05-29 LAB — BLOOD GAS, VENOUS
ACID-BASE EXCESS: 7.3 mmol/L — AB (ref 0.0–2.0)
BICARBONATE: 33.6 mmol/L — AB (ref 20.0–28.0)
O2 SAT: 87.4 %
PH VEN: 7.41 (ref 7.250–7.430)
Patient temperature: 37
pCO2, Ven: 53 mmHg (ref 44.0–60.0)
pO2, Ven: 53 mmHg — ABNORMAL HIGH (ref 32.0–45.0)

## 2018-05-29 LAB — BASIC METABOLIC PANEL
Anion gap: 6 (ref 5–15)
BUN: 26 mg/dL — ABNORMAL HIGH (ref 8–23)
CALCIUM: 8.2 mg/dL — AB (ref 8.9–10.3)
CO2: 31 mmol/L (ref 22–32)
Chloride: 104 mmol/L (ref 98–111)
Creatinine, Ser: 1.68 mg/dL — ABNORMAL HIGH (ref 0.61–1.24)
GFR, EST AFRICAN AMERICAN: 42 mL/min — AB (ref >60)
GFR, EST NON AFRICAN AMERICAN: 37 mL/min — AB (ref >60)
Glucose, Bld: 195 mg/dL — ABNORMAL HIGH (ref 70–99)
POTASSIUM: 3.6 mmol/L (ref 3.5–5.1)
SODIUM: 141 mmol/L (ref 135–145)

## 2018-05-29 LAB — BRAIN NATRIURETIC PEPTIDE: B NATRIURETIC PEPTIDE 5: 283 pg/mL — AB (ref 0.0–100.0)

## 2018-05-29 LAB — GLUCOSE, CAPILLARY
Glucose-Capillary: 344 mg/dL — ABNORMAL HIGH (ref 70–99)
Glucose-Capillary: 433 mg/dL — ABNORMAL HIGH (ref 70–99)

## 2018-05-29 LAB — TROPONIN I: Troponin I: <0.03 ng/mL (ref <0.03)

## 2018-05-29 LAB — TSH: TSH: 1.646 u[IU]/mL (ref 0.350–4.500)

## 2018-05-29 MED ORDER — ONDANSETRON HCL 4 MG/2ML IJ SOLN: 4.0000 mg | Freq: Four times a day (QID) | INTRAMUSCULAR | Status: DC | PRN

## 2018-05-29 MED ORDER — TIOTROPIUM BROMIDE MONOHYDRATE 18 MCG IN CAPS
18.0000 ug | ORAL_CAPSULE | Freq: Every day | RESPIRATORY_TRACT | Status: DC
  Administered 2018-05-29: 18 ug via RESPIRATORY_TRACT
  Filled 2018-05-29 (×2): qty 5

## 2018-05-29 MED ORDER — ENOXAPARIN SODIUM 40 MG/0.4ML ~~LOC~~ SOLN
40.0000 mg | SUBCUTANEOUS | Status: DC
  Administered 2018-05-29: 40 mg via SUBCUTANEOUS
  Filled 2018-05-29: qty 0.4

## 2018-05-29 MED ORDER — ROSUVASTATIN CALCIUM 10 MG PO TABS
40.0000 mg | ORAL_TABLET | Freq: Every day | ORAL | Status: DC
  Administered 2018-05-30 – 2018-05-31 (×2): 40 mg via ORAL
  Filled 2018-05-29: qty 8
  Filled 2018-05-29 (×2): qty 4

## 2018-05-29 MED ORDER — FLUTICASONE PROPIONATE 50 MCG/ACT NA SUSP
2.0000 | Freq: Every day | NASAL | Status: DC | PRN
  Filled 2018-05-29: qty 16

## 2018-05-29 MED ORDER — ACETAMINOPHEN 325 MG PO TABS: 650.0000 mg | ORAL_TABLET | Freq: Four times a day (QID) | ORAL | Status: DC | PRN

## 2018-05-29 MED ORDER — ISOSORBIDE MONONITRATE ER 30 MG PO TB24
30.0000 mg | ORAL_TABLET | Freq: Every day | ORAL | Status: DC
  Administered 2018-05-30 – 2018-05-31 (×2): 30 mg via ORAL
  Filled 2018-05-29 (×2): qty 1

## 2018-05-29 MED ORDER — FUROSEMIDE 10 MG/ML IJ SOLN
40.0000 mg | Freq: Once | INTRAMUSCULAR | Status: AC
  Administered 2018-05-29: 40 mg via INTRAVENOUS
  Filled 2018-05-29: qty 4

## 2018-05-29 MED ORDER — INSULIN ASPART 100 UNIT/ML ~~LOC~~ SOLN
0.0000 [IU] | Freq: Three times a day (TID) | SUBCUTANEOUS | Status: DC
  Administered 2018-05-29: 7 [IU] via SUBCUTANEOUS
  Filled 2018-05-29: qty 1

## 2018-05-29 MED ORDER — GLIMEPIRIDE 1 MG PO TABS
1.0000 mg | ORAL_TABLET | Freq: Two times a day (BID) | ORAL | Status: DC
  Administered 2018-05-29 – 2018-05-31 (×4): 1 mg via ORAL
  Filled 2018-05-29 (×5): qty 1

## 2018-05-29 MED ORDER — IPRATROPIUM-ALBUTEROL 0.5-2.5 (3) MG/3ML IN SOLN
3.0000 mL | Freq: Once | RESPIRATORY_TRACT | Status: AC
  Administered 2018-05-29: 3 mL via RESPIRATORY_TRACT
  Filled 2018-05-29: qty 3

## 2018-05-29 MED ORDER — PANTOPRAZOLE SODIUM 40 MG PO TBEC
40.0000 mg | DELAYED_RELEASE_TABLET | Freq: Every day | ORAL | Status: DC
  Administered 2018-05-30 – 2018-05-31 (×2): 40 mg via ORAL
  Filled 2018-05-29 (×2): qty 1

## 2018-05-29 MED ORDER — METHYLPREDNISOLONE SODIUM SUCC 125 MG IJ SOLR
125.0000 mg | Freq: Once | INTRAMUSCULAR | Status: AC
Start: 2018-05-29 — End: 2018-05-29
  Administered 2018-05-29: 125 mg via INTRAVENOUS
  Filled 2018-05-29: qty 2

## 2018-05-29 MED ORDER — DIPHENHYDRAMINE HCL 25 MG PO CAPS
25.0000 mg | ORAL_CAPSULE | Freq: Every evening | ORAL | Status: DC | PRN
  Administered 2018-05-29 – 2018-05-30 (×2): 25 mg via ORAL
  Filled 2018-05-29 (×2): qty 1

## 2018-05-29 MED ORDER — AMLODIPINE BESYLATE 5 MG PO TABS
5.0000 mg | ORAL_TABLET | Freq: Every day | ORAL | Status: DC
  Administered 2018-05-30 – 2018-05-31 (×2): 5 mg via ORAL
  Filled 2018-05-29 (×2): qty 1

## 2018-05-29 MED ORDER — ONDANSETRON HCL 4 MG PO TABS: 4.0000 mg | ORAL_TABLET | Freq: Four times a day (QID) | ORAL | Status: DC | PRN

## 2018-05-29 MED ORDER — CLOPIDOGREL BISULFATE 75 MG PO TABS
75.0000 mg | ORAL_TABLET | Freq: Every day | ORAL | Status: DC
  Administered 2018-05-30 – 2018-05-31 (×2): 75 mg via ORAL
  Filled 2018-05-29 (×2): qty 1

## 2018-05-29 MED ORDER — ORAL CARE MOUTH RINSE
15.0000 mL | Freq: Two times a day (BID) | OROMUCOSAL | Status: DC
  Administered 2018-05-29 – 2018-05-31 (×5): 15 mL via OROMUCOSAL

## 2018-05-29 MED ORDER — IPRATROPIUM-ALBUTEROL 0.5-2.5 (3) MG/3ML IN SOLN
3.0000 mL | RESPIRATORY_TRACT | Status: DC
  Administered 2018-05-29 – 2018-05-31 (×11): 3 mL via RESPIRATORY_TRACT
  Filled 2018-05-29 (×11): qty 3

## 2018-05-29 MED ORDER — SENNOSIDES-DOCUSATE SODIUM 8.6-50 MG PO TABS
1.0000 | ORAL_TABLET | Freq: Two times a day (BID) | ORAL | Status: DC
  Administered 2018-05-29 – 2018-05-31 (×4): 1 via ORAL
  Filled 2018-05-29 (×5): qty 1

## 2018-05-29 MED ORDER — METOPROLOL SUCCINATE ER 100 MG PO TB24
200.0000 mg | ORAL_TABLET | Freq: Every day | ORAL | Status: DC
  Administered 2018-05-30 – 2018-05-31 (×2): 200 mg via ORAL
  Filled 2018-05-29 (×2): qty 2

## 2018-05-29 MED ORDER — SODIUM CHLORIDE 0.9 % IV SOLN
1.0000 g | INTRAVENOUS | Status: DC
  Administered 2018-05-29 – 2018-05-30 (×2): 1 g via INTRAVENOUS
  Filled 2018-05-29: qty 1
  Filled 2018-05-29 (×2): qty 10

## 2018-05-29 MED ORDER — INSULIN ASPART 100 UNIT/ML ~~LOC~~ SOLN: 0.0000 [IU] | Freq: Every day | SUBCUTANEOUS | Status: DC

## 2018-05-29 MED ORDER — AZITHROMYCIN 250 MG PO TABS
500.0000 mg | ORAL_TABLET | Freq: Every day | ORAL | Status: AC
  Administered 2018-05-29 – 2018-05-31 (×3): 500 mg via ORAL
  Filled 2018-05-29 (×4): qty 2

## 2018-05-29 MED ORDER — GABAPENTIN 100 MG PO CAPS
100.0000 mg | ORAL_CAPSULE | Freq: Two times a day (BID) | ORAL | Status: DC
  Administered 2018-05-29 – 2018-05-31 (×4): 100 mg via ORAL
  Filled 2018-05-29 (×4): qty 1

## 2018-05-29 MED ORDER — ACETAMINOPHEN 650 MG RE SUPP: 650.0000 mg | Freq: Four times a day (QID) | RECTAL | Status: DC | PRN

## 2018-05-29 MED ORDER — MOMETASONE FURO-FORMOTEROL FUM 100-5 MCG/ACT IN AERO
2.0000 | INHALATION_SPRAY | Freq: Two times a day (BID) | RESPIRATORY_TRACT | Status: DC
  Administered 2018-05-29 – 2018-05-31 (×4): 2 via RESPIRATORY_TRACT
  Filled 2018-05-29: qty 8.8

## 2018-05-29 MED ORDER — METHYLPREDNISOLONE SODIUM SUCC 125 MG IJ SOLR
60.0000 mg | INTRAMUSCULAR | Status: DC
  Administered 2018-05-29 – 2018-05-30 (×2): 60 mg via INTRAVENOUS
  Filled 2018-05-29 (×2): qty 2

## 2018-05-29 MED ORDER — HYDRALAZINE HCL 50 MG PO TABS
100.0000 mg | ORAL_TABLET | Freq: Three times a day (TID) | ORAL | Status: DC
  Administered 2018-05-29 – 2018-05-31 (×6): 100 mg via ORAL
  Filled 2018-05-29 (×6): qty 2

## 2018-05-29 MED ORDER — INSULIN ASPART 100 UNIT/ML ~~LOC~~ SOLN
0.0000 [IU] | Freq: Three times a day (TID) | SUBCUTANEOUS | Status: DC
  Administered 2018-05-29: 15 [IU] via SUBCUTANEOUS
  Administered 2018-05-30: 5 [IU] via SUBCUTANEOUS
  Administered 2018-05-30 (×3): 8 [IU] via SUBCUTANEOUS
  Administered 2018-05-31: 5 [IU] via SUBCUTANEOUS
  Administered 2018-05-31: 3 [IU] via SUBCUTANEOUS
  Filled 2018-05-29 (×7): qty 1

## 2018-05-29 MED ORDER — FUROSEMIDE 10 MG/ML IJ SOLN
40.0000 mg | Freq: Two times a day (BID) | INTRAMUSCULAR | Status: DC
  Administered 2018-05-29 – 2018-05-30 (×2): 40 mg via INTRAVENOUS
  Filled 2018-05-29 (×2): qty 4

## 2018-05-29 NOTE — Progress Notes (Signed)
*  PRELIMINARY RESULTS* Echocardiogram 2D Echocardiogram has been performed.  Lavell Luster Cassidy Tabet 05/29/2018, 8:02 PM

## 2018-05-29 NOTE — Progress Notes (Signed)
   [place] at [facility] Day: 0 days Brett [name] is a 83 y.o. male presenting with Chest Pain and Shortness of Breath .   Advance care planning discussed with patient  at bedside. All questions in regards to overall condition and expected prognosis answered.  Patient states he is divorced and his ex-wife recently passed away.  All his other family including his son are in [place].  He is not in touch with most of them.  Has a friend who checks on him.  Patient is alert oriented and can make his own decisions.  He has clearly indicated that if his heart were to stop, he would not want to be resuscitated.  The decision was made to change his code status to DNR.   CODE STATUS: DNR Time spent: 18 minutes

## 2018-05-29 NOTE — ED Notes (Signed)
Pt in NAD. Bed locked and in lowest position and call bell is within reach.

## 2018-05-29 NOTE — ED Triage Notes (Signed)
Pt presents to ED via [facility] from home with c/o CP and SHOB x 2 days. Per EMS CP and SHOB started yesterday, resolved then restarted today. EMS reports pt stopped smoking approx 1 yr ago, +1 pitting edema BLE, 324 ASA, 20g L forearm, 1SL nitro tab.

## 2018-05-29 NOTE — Progress Notes (Signed)
Pt requesting something to help him sleep. Pt states he sometimes takes something at home, but can't remember the name, nothing listed in his home med list. MD paged, Dr. Jannifer Franklin to put in orders for benadryl.  Conley Simmonds, RN, BSN

## 2018-05-29 NOTE — ED Notes (Addendum)
Pt updated on admission and pts friend (emergency contact) updated and has left the bedside. Urinal in reach and call bell in reach.

## 2018-05-29 NOTE — ED Provider Notes (Signed)
[facility] Emergency Department Provider Note       Time seen: ----------------------------------------- 10:05 AM on 05/29/2018 -----------------------------------------   I have reviewed the triage vital signs and the nursing notes.  HISTORY   Chief Complaint Chest Pain and Shortness of Breath    HPI Brett [name] is a 83 y.o. male with a history of anemia, aortic regurgitation, bronchitis, coronary disease, diabetes, COPD, hyperlipidemia, hypertension, peripheral vascular disease who presents to the ED for chest pain shortness of breath for the past 2 days.  Patient states actually the chest pain has been going on for at least a week.  Patient states shortness of breath started yesterday and resolved then restarted today.  He reports a long smoking history, he is on chronic 2 L nasal cannula oxygen.  He denies fevers, chills or other complaints.  Past Medical History:  Diagnosis Date  . Anemia   . Aortic regurgitation   . 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 Bronchitis   . CAD (coronary artery disease)   . Carotid stenosis   . Diabetes mellitus without complication ([place])   . Diabetes mellitus, type 2 ([place])   . Emphysema of lung ([place])   . Essential hypertension   . History of chemotherapy   . Hyperlipidemia   . Hypertension   . IDA (iron deficiency anemia)   . Leg edema   . Meralgia paraesthetica   . Mitral regurgitation   . PUD (peptic ulcer disease)   . PVD (peripheral vascular disease) ([place])   . Tobacco abuse     Patient Active Problem List   Diagnosis Date Noted  . COPD exacerbation ([place]) 12/05/2017  . CAP (community acquired pneumonia) 11/13/2017  . Acute respiratory failure ([place]) 11/13/2017  . SIRS (systemic inflammatory response syndrome) ([place]) 11/13/2017  . Stroke ([place]) 12/02/2016  . Stroke (cerebrum) ([place]) 12/02/2016  . Leg weakness, bilateral 09/03/2016  . Chronic diastolic heart failure ([place]) 08/06/2016  . Pleural  effusion 08/05/2016  . Sepsis ([place]) 07/10/2016  . HCAP (healthcare-associated pneumonia) 07/10/2016  . HTN (hypertension) 07/10/2016  . Diabetes ([place]) 07/10/2016  . CAD (coronary artery disease) 07/10/2016  . COPD (chronic obstructive pulmonary disease) ([place]) 06/20/2016  . Chest pain 06/18/2016  . B-cell lymphoma ([place]) 10/14/2015    Past Surgical History:  Procedure Laterality Date  . CARDIAC CATHETERIZATION  08/15/2007  . CARDIAC CATHETERIZATION Right 07/13/2016   Procedure: Right/Left Heart Cath and Coronary Angiography;  Surgeon: Dionisio David, MD;  Location: [facility];  Service: Cardiovascular;  Laterality: Right;  . COLONOSCOPY  03/2013  . ESOPHAGOGASTRODUODENOSCOPY  03/2013    Allergies Patient has no known allergies.  Social History Social History   Tobacco Use  . Smoking status: Former Smoker    Types: Cigarettes    Last attempt to quit: 08/30/2015    Years since quitting: 2.7  . Smokeless tobacco: Never Used  Substance Use Topics  . Alcohol use: No    Frequency: Never  . Drug use: No   Review of Systems Constitutional: Negative for fever. Cardiovascular: Positive for chest pain Respiratory: Positive for shortness of breath Gastrointestinal: Negative for abdominal pain, vomiting and diarrhea. Musculoskeletal: Negative for back pain. Skin: Negative for rash. Neurological: Negative for headaches, focal weakness or numbness.  All systems negative/normal/unremarkable except as stated in the HPI  ____________________________________________   PHYSICAL EXAM:  VITAL SIGNS: ED Triage Vitals [05/29/18 1004]  Enc Vitals Group     BP  Pulse      Resp      Temp      Temp src      SpO2 90 %     Weight      Height      Head Circumference      Peak Flow      Pain Score      Pain Loc      Pain Edu?      Excl. in [place]?    Constitutional: Alert and oriented.  Mild distress Eyes: Conjunctivae are normal. Normal extraocular movements. ENT    Head: Normocephalic and atraumatic.   Nose: No congestion/rhinnorhea.   Mouth/Throat: Mucous membranes are moist.   Neck: No stridor. Cardiovascular: Normal rate, regular rhythm. No murmurs, rubs, or gallops. Respiratory: Diminished breath sounds with wheezing bilaterally Gastrointestinal: Soft and nontender. Normal bowel sounds Musculoskeletal: Nontender with normal range of motion in extremities. No lower extremity tenderness nor edema. Neurologic:  Normal speech and language. No gross focal neurologic deficits are appreciated.  Skin:  Skin is warm, dry and intact. No rash noted. Psychiatric: Mood and affect are normal. Speech and behavior are normal.  ____________________________________________  EKG: Interpreted by me.  Sinus rhythm the rate of 94 bpm, prolonged PR interval, normal axis, normal QT.  ____________________________________________  ED COURSE:  As part of my medical decision making, I reviewed the following data within the [place] History obtained from family if available, nursing notes, old chart and ekg, as well as notes from prior ED visits. Patient presented for chest pain shortness of breath, we will assess with labs and imaging as indicated at this time.   Procedures ____________________________________________   LABS (pertinent positives/negatives)  Labs Reviewed  BASIC METABOLIC PANEL - Abnormal; Notable for the following components:      Result Value   Glucose, Bld 195 (*)    BUN 26 (*)    Creatinine, Ser 1.68 (*)    Calcium 8.2 (*)    GFR calc non Af Amer 37 (*)    GFR calc Af Amer 42 (*)    All other components within normal limits  BRAIN NATRIURETIC PEPTIDE - Abnormal; Notable for the following components:   B Natriuretic Peptide 283.0 (*)    All other components within normal limits  BLOOD GAS, VENOUS - Abnormal; Notable for the following components:   pO2, Ven 53.0 (*)    Bicarbonate 33.6 (*)    Acid-Base Excess 7.3  (*)    All other components within normal limits  CBC WITH DIFFERENTIAL/PLATELET - Abnormal; Notable for the following components:   RBC 3.40 (*)    Hemoglobin 9.6 (*)    HCT 28.5 (*)    RDW 16.4 (*)    Lymphs Abs 0.8 (*)    All other components within normal limits  TROPONIN I  CBC WITH DIFFERENTIAL/PLATELET   CRITICAL CARE Performed by: Laurence Aly   Total critical care time: 30 minutes  Critical care time was exclusive of separately billable procedures and treating other patients.  Critical care was necessary to treat or prevent imminent or life-threatening deterioration.  Critical care was time spent personally by me on the following activities: development of treatment plan with patient and/or surrogate as well as nursing, discussions with consultants, evaluation of patient's response to treatment, examination of patient, obtaining history from patient or surrogate, ordering and performing treatments and interventions, ordering and review of laboratory studies, ordering and review of radiographic studies, pulse oximetry and  re-evaluation of patient's condition.  RADIOLOGY  Chest x-ray IMPRESSION: Patchy bilateral lower lobe airspace opacities, increasing on the right since prior study. This could represent edema or infection.  Cardiomegaly.  Small bilateral effusions. ____________________________________________  DIFFERENTIAL DIAGNOSIS   COPD, pneumonia, unstable angina, PE, MI  FINAL ASSESSMENT AND PLAN  COPD exacerbation, CHF   Plan: The patient had presented for chest pain shortness of breath. Patient's labs were grossly at his baseline. Patient's imaging did reveal patchy bilateral lower lobe airspace opacities.  This likely represents edema although I cannot rule out a pneumonia.  He was given IV Lasix as well as IV Solu-Medrol and duo nebs here.  He would benefit from hospital observation.   Laurence Aly, MD   Note: This note was  generated in part or whole with voice recognition software. Voice recognition is usually quite accurate but there are transcription errors that can and very often do occur. I apologize for any typographical errors that were not detected and corrected.     Earleen Newport, MD 05/29/18 1255

## 2018-05-29 NOTE — Progress Notes (Signed)
Pt's blood sugar is 433, pt only has sliding scale up to 5units. Pt received IV solumedrol and amaryl earlier this evening. MD paged, Dr. Jannifer Franklin to put a higher dose sliding scale into and change blood sugar checks to before meals, bedtime and 3am.   Conley Simmonds, RN, BSN

## 2018-05-29 NOTE — H&P (Signed)
[place] [facility] [place] NAME: Brett [name]    MR#:  [id number]  DATE OF BIRTH:  November 28, 1935  DATE OF ADMISSION:  05/29/2018  PRIMARY CARE PHYSICIAN: Perrin Maltese, MD   REQUESTING/REFERRING PHYSICIAN: Dr. Lenise Arena  CHIEF COMPLAINT:   Chief Complaint  Patient presents with  . Chest Pain  . Shortness of Breath    HISTORY OF PRESENT ILLNESS:  Brett [name]  is a 83 y.o. male with a known history of marginal zone lymphoma status post chemotherapy, anemia, hypertension, diabetes mellitus, coronary artery disease, aortic regurgitation and peripheral vascular disease and history of smoking, COPD on 2-3 L home oxygen presents to hospital secondary to worsening shortness of breath. Patient states his symptoms started about 2 to 3 days ago.  Initially was dyspnea on exertion, much worsened to dyspnea at rest today.  Denies any cough, no fevers or chills.  No nausea or vomiting.  Complains of generalized myalgias and also worsening lower extremity edema.  He states at home by himself and his neighbors and friends check on him.  Currently on 3 L oxygen, chest x-ray with bibasilar infiltrates and also pulmonary edema.  PAST MEDICAL HISTORY:   Past Medical History:  Diagnosis Date  . Anemia   . Aortic regurgitation   . 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 Bronchitis   . CAD (coronary artery disease)   . Carotid stenosis   . Diabetes mellitus without complication ([place])   . Diabetes mellitus, type 2 ([place])   . Emphysema of lung ([place])   . Essential hypertension   . History of chemotherapy   . Hyperlipidemia   . Hypertension   . IDA (iron deficiency anemia)   . Leg edema   . Meralgia paraesthetica   . Mitral regurgitation   . PUD (peptic ulcer disease)   . PVD (peripheral vascular disease) ([place])   . Tobacco abuse     PAST SURGICAL HISTORY:   Past Surgical History:  Procedure Laterality Date  . CARDIAC CATHETERIZATION   08/15/2007  . CARDIAC CATHETERIZATION Right 07/13/2016   Procedure: Right/Left Heart Cath and Coronary Angiography;  Surgeon: Dionisio David, MD;  Location: [facility];  Service: Cardiovascular;  Laterality: Right;  . COLONOSCOPY  03/2013  . ESOPHAGOGASTRODUODENOSCOPY  03/2013    SOCIAL HISTORY:   Social History   Tobacco Use  . Smoking status: Former Smoker    Types: Cigarettes    Last attempt to quit: 08/30/2015    Years since quitting: 2.7  . Smokeless tobacco: Never Used  Substance Use Topics  . Alcohol use: No    Frequency: Never    FAMILY HISTORY:   Family History  Problem Relation Age of Onset  . CAD Mother   . Rheum arthritis Neg Hx   . Osteoarthritis Neg Hx   . Asthma Neg Hx   . Diabetes Neg Hx   . Cancer Neg Hx     DRUG ALLERGIES:  No Known Allergies  REVIEW OF SYSTEMS:   Review of Systems  Constitutional: Positive for malaise/fatigue. Negative for chills, fever and weight loss.  HENT: Positive for hearing loss. Negative for ear discharge, ear pain, nosebleeds and tinnitus.   Eyes: Positive for blurred vision. Negative for double vision and photophobia.  Respiratory: Positive for shortness of breath. Negative for cough, hemoptysis and wheezing.   Cardiovascular: Positive for leg swelling. Negative for chest pain, palpitations and orthopnea.  Gastrointestinal: Negative  for abdominal pain, constipation, diarrhea, melena, nausea and vomiting.  Genitourinary: Negative for dysuria, frequency and urgency.  Musculoskeletal: Positive for back pain. Negative for myalgias and neck pain.  Skin: Negative for rash.  Neurological: Negative for dizziness, tingling, sensory change, speech change, focal weakness and headaches.  Endo/Heme/Allergies: Does not bruise/bleed easily.  Psychiatric/Behavioral: Negative for depression.    MEDICATIONS AT HOME:   Prior to Admission medications   Medication Sig Start Date End Date Taking? Authorizing Provider  albuterol  (PROVENTIL HFA;VENTOLIN HFA) 108 (90 Base) MCG/ACT inhaler Inhale 2 puffs into the lungs every 6 (six) hours as needed for wheezing or shortness of breath. 09/24/17  Yes Wieting, Richard, MD  amLODipine (NORVASC) 5 MG tablet Take 5 mg by mouth daily.  08/27/15  Yes [provider]  clopidogrel (PLAVIX) 75 MG tablet Take 75 mg by mouth daily.  08/27/15  Yes [provider]  fluticasone (FLONASE) 50 MCG/ACT nasal spray Place 2 sprays into the nose daily as needed for allergies.   Yes [provider]  gabapentin (NEURONTIN) 100 MG capsule Take 1 capsule by mouth 2 (two) times daily.  09/22/15  Yes [provider]  glimepiride (AMARYL) 1 MG tablet Take 1 mg by mouth 2 (two) times daily.   Yes [provider]  hydrALAZINE (APRESOLINE) 100 MG tablet Take 100 mg by mouth 3 (three) times daily.   Yes [provider]  isosorbide mononitrate (IMDUR) 30 MG 24 hr tablet Take 30 mg by mouth daily.   Yes [provider]  metoprolol (TOPROL-XL) 200 MG 24 hr tablet Take 1 tablet (200 mg total) by mouth daily. 09/24/17  Yes Wieting, Richard, MD  pantoprazole (PROTONIX) 40 MG tablet Take 40 mg by mouth daily.   Yes [provider]  rosuvastatin (CRESTOR) 40 MG tablet Take 40 mg by mouth daily.   Yes [provider]  Fluticasone-Salmeterol (ADVAIR DISKUS) 250-50 MCG/DOSE AEPB Inhale 1 puff into the lungs 2 times daily at 12 noon and 4 pm. Patient not taking: Reported on 11/13/2017 06/21/16   [name], MD  furosemide (LASIX) 40 MG tablet Take 0.5 tablets (20 mg total) by mouth daily. Patient not taking: Reported on 05/29/2018 01/01/18   Vaughan Basta, MD  mometasone-formoterol [facility]) 100-5 MCG/ACT AERO Inhale 2 puffs into the lungs 2 (two) times daily. Patient not taking: Reported on 05/29/2018 12/08/17   Max Sane, MD  nicotine (NICODERM CQ - DOSED IN MG/24 HR) 7 mg/24hr patch Place 1 patch (7 mg total) onto the skin  daily. Patient not taking: Reported on 11/13/2017 09/24/17   Loletha Grayer, MD  predniSONE (STERAPRED UNI-PAK 21 TAB) 10 MG (21) TBPK tablet Start at 60mg  taper by 10mg  until complete Patient not taking: Reported on 05/29/2018 01/01/18   Vaughan Basta, MD  senna-docusate (SENOKOT-S) 8.6-50 MG tablet Take 1 tablet by mouth 2 (two) times daily. Patient not taking: Reported on 11/13/2017 06/21/16   [name], MD  tiotropium (SPIRIVA HANDIHALER) 18 MCG inhalation capsule Place 1 capsule (18 mcg total) into inhaler and inhale daily. Patient not taking: Reported on 11/13/2017 09/24/17   Loletha Grayer, MD      VITAL SIGNS:  Blood pressure (!) 169/85, pulse 93, temperature 97.8 F (36.6 C), temperature source Oral, resp. rate 20, height 6' (1.829 m), weight 102.1 kg (225 lb), SpO2 (!) 87 %.  PHYSICAL EXAMINATION:   Physical Exam  GENERAL:  83 y.o.-year-old patient lying in the bed with no acute distress.  EYES: Pupils equal, round,  reactive to light and accommodation.  Decreased visual acuity in the left eye.  Also cataract noted.  Erythematous right eye conjunctival from surgery.  No scleral icterus. Extraocular muscles intact.  HEENT: Head atraumatic, normocephalic. Oropharynx and nasopharynx clear.  NECK:  Supple, no jugular venous distention. No thyroid enlargement, no tenderness.  LUNGS: Normal breath sounds bilaterally, no wheezing, rhonchi or crepitation.  Significantly decreased basilar breath sounds especially on the right side.  Occasional crackles heard.  No use of accessory muscles of respiration.  CARDIOVASCULAR: S1, S2 normal. No  rubs, or gallops.  3/6 systolic murmur is heard ABDOMEN: Soft, nontender, nondistended. Bowel sounds present. No organomegaly or mass.  EXTREMITIES: No  cyanosis, or clubbing. 1-2+ bilateral pedal edema noted NEUROLOGIC: Cranial nerves II through XII are intact. Muscle strength 5/5 in all extremities. Sensation intact. Gait not checked.   PSYCHIATRIC: The patient is alert and oriented x 3.  SKIN: No obvious rash, lesion, or ulcer.   LABORATORY PANEL:   CBC Recent Labs  Lab 05/29/18 1007  WBC 8.0  HGB 9.6*  HCT 28.5*  PLT 204   ------------------------------------------------------------------------------------------------------------------  Chemistries  Recent Labs  Lab 05/29/18 1007  NA 141  K 3.6  CL 104  CO2 31  GLUCOSE 195*  BUN 26*  CREATININE 1.68*  CALCIUM 8.2*   ------------------------------------------------------------------------------------------------------------------  Cardiac Enzymes Recent Labs  Lab 05/29/18 1007  TROPONINI <0.03   ------------------------------------------------------------------------------------------------------------------  RADIOLOGY:  Dg Chest Port 1 View  Result Date: 05/29/2018 CLINICAL DATA:  Shortness of Breath EXAM: PORTABLE CHEST 1 VIEW COMPARISON:  12/30/2017 FINDINGS: Cardiomegaly with vascular congestion. Patchy bilateral lower lobe airspace opacities and small effusions. Airspace opacities in the right lung base have increased. No acute bony abnormality. IMPRESSION: Patchy bilateral lower lobe airspace opacities, increasing on the right since prior study. This could represent edema or infection. Cardiomegaly. Small bilateral effusions. Electronically Signed   By: Rolm Baptise M.D.   On: 05/29/2018 10:30    EKG:   Orders placed or performed during the hospital encounter of 05/29/18  . ED EKG  . ED EKG  . EKG 12-Lead  . EKG 12-Lead    IMPRESSION AND PLAN:   Sosaia Pittinger  is a 83 y.o. male with a known history of marginal zone lymphoma status post chemotherapy, anemia, hypertension, diabetes mellitus, coronary artery disease, aortic regurgitation and peripheral vascular disease and history of smoking, COPD on 2-3 L home oxygen presents to hospital secondary to worsening shortness of breath.  1.  Acute on chronic CHF exacerbation-likely  diastolic dysfunction.  Last echocardiogram 2 years ago showing EF of 55% but also has slightly elevated pulmonary pressures -Not taking Lasix at home.  Restarted Lasix IV twice daily -Monitor on telemetry, recycle troponins. -Repeat echocardiogram  2.  Acute on chronic COPD exacerbation with possible community-acquired pneumonia -IV steroids, inhalers and duo nebs -Chest x-ray with possible infiltrates at the bases.  Will cover with Rocephin and azithromycin -Continue supplemental oxygen as patient chronically  3.  Diabetes mellitus-on glimepiride, also sliding scale insulin  4.  Hypertension-continue Imdur, metoprolol, Norvasc and hydralazine.  Also on Lasix  5.  GERD-Protonix  6.  DVT prophylaxis-Lovenox  7.  Marginal zone lymphoma-follow-up with oncology as outpatient at [place] therapy consulted    All the records are reviewed and case discussed with ED provider. Management plans discussed with the patient, family and they are in agreement.  CODE STATUS: DNR  TOTAL TIME TAKING CARE OF THIS PATIENT: 50 minutes.  Gladstone Lighter M.D on 05/29/2018 at 1:49 PM  Between 7am to 6pm - Pager - [phone]  After 6pm go to [email or web address] - password [email or web address]  Office  [phone]  CC: Primary care physician; Perrin Maltese, MD

## 2018-05-29 NOTE — ED Notes (Signed)
PTs oxygen saturation is reading low. Upon assessment pts [name] was not on properly. [place] readjusted and oxygen saturation started to increase. PT remains on 3L at this time. PT in NAD and updated on delay.

## 2018-05-30 DIAGNOSIS — I110 Hypertensive heart disease with heart failure: Secondary | ICD-10-CM | POA: Diagnosis not present

## 2018-05-30 DIAGNOSIS — J441 Chronic obstructive pulmonary disease with (acute) exacerbation: Secondary | ICD-10-CM | POA: Diagnosis not present

## 2018-05-30 LAB — CBC
HEMATOCRIT: 27.7 % — AB (ref 40.0–52.0)
Hemoglobin: 9.2 g/dL — ABNORMAL LOW (ref 13.0–18.0)
MCH: 27.8 pg (ref 26.0–34.0)
MCHC: 33.3 g/dL (ref 32.0–36.0)
MCV: 83.6 fL (ref 80.0–100.0)
PLATELETS: 201 10*3/uL (ref 150–440)
RBC: 3.31 MIL/uL — AB (ref 4.40–5.90)
RDW: 16.4 % — AB (ref 11.5–14.5)
WBC: 9.7 10*3/uL (ref 3.8–10.6)

## 2018-05-30 LAB — BASIC METABOLIC PANEL
Anion gap: 10 (ref 5–15)
BUN: 39 mg/dL — ABNORMAL HIGH (ref 8–23)
CHLORIDE: 101 mmol/L (ref 98–111)
CO2: 31 mmol/L (ref 22–32)
CREATININE: 2.28 mg/dL — AB (ref 0.61–1.24)
Calcium: 8.1 mg/dL — ABNORMAL LOW (ref 8.9–10.3)
GFR calc non Af Amer: 25 mL/min — ABNORMAL LOW (ref >60)
GFR, EST AFRICAN AMERICAN: 29 mL/min — AB (ref >60)
Glucose, Bld: 267 mg/dL — ABNORMAL HIGH (ref 70–99)
POTASSIUM: 3.6 mmol/L (ref 3.5–5.1)
Sodium: 142 mmol/L (ref 135–145)

## 2018-05-30 LAB — TROPONIN I: TROPONIN I: 0.03 ng/mL — AB (ref <0.03)

## 2018-05-30 LAB — ECHOCARDIOGRAM COMPLETE
HEIGHTINCHES: 72 in
WEIGHTICAEL: 3600 [oz_av]

## 2018-05-30 LAB — HEMOGLOBIN A1C
HEMOGLOBIN A1C: 7.2 % — AB (ref 4.8–5.6)
Hgb A1c MFr Bld: 7.2 % — ABNORMAL HIGH (ref 4.8–5.6)
MEAN PLASMA GLUCOSE: 159.94 mg/dL
Mean Plasma Glucose: 159.94 mg/dL

## 2018-05-30 LAB — GLUCOSE, CAPILLARY
GLUCOSE-CAPILLARY: 296 mg/dL — AB (ref 70–99)
GLUCOSE-CAPILLARY: 238 mg/dL — AB (ref 70–99)
Glucose-Capillary: 248 mg/dL — ABNORMAL HIGH (ref 70–99)
Glucose-Capillary: 269 mg/dL — ABNORMAL HIGH (ref 70–99)

## 2018-05-30 MED ORDER — ENOXAPARIN SODIUM 30 MG/0.3ML ~~LOC~~ SOLN
30.0000 mg | SUBCUTANEOUS | Status: DC
  Administered 2018-05-30: 30 mg via SUBCUTANEOUS
  Filled 2018-05-30: qty 0.3

## 2018-05-30 MED ORDER — INSULIN DETEMIR 100 UNIT/ML ~~LOC~~ SOLN
6.0000 [IU] | Freq: Every day | SUBCUTANEOUS | Status: DC
  Administered 2018-05-30: 6 [IU] via SUBCUTANEOUS
  Filled 2018-05-30 (×2): qty 0.06

## 2018-05-30 MED ORDER — INSULIN ASPART 100 UNIT/ML ~~LOC~~ SOLN
3.0000 [IU] | Freq: Three times a day (TID) | SUBCUTANEOUS | Status: DC
  Administered 2018-05-30 – 2018-05-31 (×3): 3 [IU] via SUBCUTANEOUS
  Filled 2018-05-30 (×3): qty 1

## 2018-05-30 MED ORDER — FUROSEMIDE 10 MG/ML IJ SOLN
40.0000 mg | Freq: Every day | INTRAMUSCULAR | Status: DC
  Administered 2018-05-31: 40 mg via INTRAVENOUS
  Filled 2018-05-30: qty 4

## 2018-05-30 MED ORDER — SODIUM CHLORIDE 0.9% FLUSH
3.0000 mL | Freq: Two times a day (BID) | INTRAVENOUS | Status: DC
Start: 2018-05-30 — End: 2018-05-31
  Administered 2018-05-30 – 2018-05-31 (×2): 3 mL via INTRAVENOUS

## 2018-05-30 NOTE — Evaluation (Addendum)
Physical Therapy Evaluation Patient Details Name: Brett [name] MRN: [id number] DOB: 1935/10/31 Today's Date: 05/30/2018   History of Present Illness  Patient is 83 yo male admitted for chest pain and SOB, PMH includes anemia, bronchitis, coronary disease, diabetes, COPD, hyperlipidemia, hypertension, peripheral vascular disease, emphysema, peptic ulcer disease, stroke, marginal zone lymphoma status post chemotherapy aortic regurgitation, peripheral vascular disease, smoking on 2-3 L home oxygen and HOH.   Clinical Impression  Patient A&Ox4 with no pain at start of session. Patient reports independence with ADLs/mobility and minimal assistance with IADLs (patient does not drive but goes with friend for groceries). States he has RW and cane at home but does not use them regularly because he "does not want to depend on them". Pt states uses oxygen as needed at home & at night, but constant use out in the community. Patient's bed mobility mod I, transfers with CGA/supervision, and ambulated 347ft with RW CGA/supervision, and 26ft without RW with CGA. [place] at 3L in place throughout session. 1 instance of Desat to 86% with fast gait and poor breathing technique. Standing break and verbal/visual cues for breathing to recover to 88-90% for remaining ambulation at slower pace. HR 124-139 throughout mobility. Patient up in chair with spO2 91% and HR 124 at end of session. The patient would benefit from further skilled PT to address change from PLOF including decreased mobility, strength, endurance, gait and balance abnormalities and continued education about breathing techniques, energy conservation and safety.     Follow Up Recommendations Supervision - Intermittent;Outpatient PT    Equipment Recommendations  None recommended by PT    Recommendations for Other Services       Precautions / Restrictions Restrictions Weight Bearing Restrictions: No      Mobility  Bed Mobility Overal bed mobility:  Modified Independent                Transfers Overall transfer level: Needs assistance Equipment used: Rolling walker (2 wheeled) Transfers: Sit to/from Stand Sit to Stand: Supervision;Min guard            Ambulation/Gait Ambulation/Gait assistance: Supervision;Min guard Gait Distance (Feet): 340 Feet Assistive device: Rolling walker (2 wheeled);None       General Gait Details: Patient demonstrates WFLs gait, though wide base of support noted. Patient also had quick pace, with noted desat to 86% on 3L. Cues for standing break, deep breathing, returned to 90% in 1 minute, able to maintain sat 90 or above for continued ambulation at slower pace. Ambulated 32ft with RW and CGA/supervision, 20ft with no RW with CGA.  Stairs            Wheelchair Mobility    Modified Rankin (Stroke Patients Only)       Balance Overall balance assessment: Mild deficits observed, not formally tested                                           Pertinent Vitals/Pain Pain Assessment: No/denies pain    Home Living Family/patient expects to be discharged to:: Private residence Living Arrangements: Alone Available Help at Discharge: Neighbor;Friend(s) Type of Home: House Home Access: Stairs to enter Entrance Stairs-Rails: Left Entrance Stairs-Number of Steps: 3 STE Home Layout: One level Home Equipment: Walker - 2 wheels;Cane - single point;Toilet riser      Prior Function Level of Independence: Needs assistance;Independent   Gait / Transfers Assistance Needed:  Patient states he does not use his walker and cane most of the time. Reports using his oxygen for community activities, and as needed at home. States he has not fallen in the last 6 months.  ADL's / Homemaking Assistance Needed: independent with all ADLs, does not drive, needs minimal assistance with IADLs.  Comments: Patient does not want to depend on walker/cane.     Hand Dominance   Dominant  Hand: Right    Extremity/Trunk Assessment   Upper Extremity Assessment Upper Extremity Assessment: Overall WFL for tasks assessed;RUE deficits/detail;LUE deficits/detail RUE Deficits / Details: at least 4/5 LUE Deficits / Details: at least 4/5    Lower Extremity Assessment Lower Extremity Assessment: RLE deficits/detail;LLE deficits/detail RLE Deficits / Details: 4/5 LLE Deficits / Details: 4/5       Communication   Communication: HOH  Cognition Arousal/Alertness: Awake/alert Behavior During Therapy: WFL for tasks assessed/performed Overall Cognitive Status: Within Functional Limits for tasks assessed                                        General Comments      Exercises     Assessment/Plan    PT Assessment Patient needs continued PT services  PT Problem List Decreased strength;Cardiopulmonary status limiting activity;Decreased activity tolerance;Decreased balance;Decreased mobility       PT Treatment Interventions Therapeutic exercise;Balance training;Gait training;Stair training;Neuromuscular re-education;Functional mobility training;Therapeutic activities;Patient/family education    PT Goals (Current goals can be found in the Care Plan section)  Acute Rehab PT Goals Patient Stated Goal: Patient would like to return home PT Goal Formulation: With patient Time For Goal Achievement: 06/13/18 Potential to Achieve Goals: Good Additional Goals Additional Goal #1: Patient will ambulate >349ft with LRAD and supervision with good pacing and breathing techniques for spO2 greater or equal to 90% on 3L [place].    Frequency Min 2X/week   Barriers to discharge        Co-evaluation               AM-PAC PT "6 Clicks" Daily Activity  Outcome Measure Difficulty turning over in bed (including adjusting bedclothes, sheets and blankets)?: A Little Difficulty moving from lying on back to sitting on the side of the bed? : A Little Difficulty sitting down on  and standing up from a chair with arms (e.g., wheelchair, bedside commode, etc,.)?: A Little Help needed moving to and from a bed to chair (including a wheelchair)?: A Little Help needed walking in hospital room?: A Little Help needed climbing 3-5 steps with a railing? : A Little 6 Click Score: 18    End of Session Equipment Utilized During Treatment: Gait belt;Oxygen;Other (comment)(3L) Activity Tolerance: Patient tolerated treatment well Patient left: in chair;with chair alarm set;with call bell/phone within reach(with heels elevated) Nurse Communication: Mobility status PT Visit Diagnosis: Other abnormalities of gait and mobility (R26.89);Muscle weakness (generalized) (M62.81)    Time: 7517-0017 PT Time Calculation (min) (ACUTE ONLY): 25 min   Charges:   PT Evaluation $PT Eval Low Complexity: 1 Low PT Treatments $Therapeutic Activity: 8-22 mins   PT G CodesLieutenant Diego PT, DPT 9:42 AM,05/30/18 [phone]

## 2018-05-30 NOTE — Progress Notes (Signed)
CRITICAL VALUE STICKER  CRITICAL VALUE: troponin 0.03  MD NOTIFIED: Dr. Marcille Blanco  TIME OF NOTIFICATION: 4:36 AM 05/30/18  RESPONSE: no new orders.

## 2018-05-30 NOTE — Plan of Care (Signed)

## 2018-05-30 NOTE — Progress Notes (Signed)
Lovenox pharmacy dose adjustment. CrCl 28.9 ml/min  Lovenox 40 mg subq daily ordered. Per CrCl will decrease to lovenox 30 mg subq daily.  Tobie Lords, PharmD, BCPS Clinical Pharmacist 05/30/2018

## 2018-05-30 NOTE — Progress Notes (Signed)
[place] [facility] [place] NAME: Jediah Horger    MR#:  [id number]  DATE OF BIRTH:  Oct 19, 1935  SUBJECTIVE:   Patient here due to some chest pain and shortness of breath noted to be in mild CHF/also in mild COPD exacerbation.  Feels a lot better today.  No other acute events overnight.  REVIEW OF SYSTEMS:    Review of Systems  Constitutional: Negative for chills and fever.  HENT: Negative for congestion and tinnitus.   Eyes: Negative for blurred vision and double vision.  Respiratory: Positive for shortness of breath. Negative for cough and wheezing.   Cardiovascular: Negative for chest pain, orthopnea and PND.  Gastrointestinal: Negative for abdominal pain, diarrhea, nausea and vomiting.  Genitourinary: Negative for dysuria and hematuria.  Neurological: Negative for dizziness, sensory change and focal weakness.  All other systems reviewed and are negative.   Nutrition: Heart Healthy/Carb modified Tolerating Diet: yes Tolerating PT: Eval noted.    DRUG ALLERGIES:  No Known Allergies  VITALS:  Blood pressure 126/62, pulse 100, temperature 97.6 F (36.4 C), temperature source Oral, resp. rate 20, height 5\' 10"  (1.778 m), weight 91.2 kg (201 lb), SpO2 94 %.  PHYSICAL EXAMINATION:   Physical Exam  GENERAL:  83 y.o.-year-old patient lying in bed in no acute distress.  EYES: Pupils equal, round, reactive to light and accommodation. No scleral icterus. Extraocular muscles intact.  HEENT: Head atraumatic, normocephalic. Oropharynx and nasopharynx clear.  NECK:  Supple, no jugular venous distention. No thyroid enlargement, no tenderness.  LUNGS: Normal breath sounds bilaterally, no wheezing, rales, rhonchi. No use of accessory muscles of respiration.  CARDIOVASCULAR: S1, S2 normal. No murmurs, rubs, or gallops.  ABDOMEN: Soft, nontender, nondistended. Bowel sounds present. No organomegaly or mass.  EXTREMITIES: No cyanosis, clubbing, +1-2  edema b/l.     NEUROLOGIC: Cranial nerves II through XII are intact. No focal Motor or sensory deficits b/l.   PSYCHIATRIC: The patient is alert and oriented x 3.  SKIN: No obvious rash, lesion, or ulcer.    LABORATORY PANEL:   CBC Recent Labs  Lab 05/30/18 0351  WBC 9.7  HGB 9.2*  HCT 27.7*  PLT 201   ------------------------------------------------------------------------------------------------------------------  Chemistries  Recent Labs  Lab 05/30/18 0351  NA 142  K 3.6  CL 101  CO2 31  GLUCOSE 267*  BUN 39*  CREATININE 2.28*  CALCIUM 8.1*   ------------------------------------------------------------------------------------------------------------------  Cardiac Enzymes Recent Labs  Lab 05/30/18 0351  TROPONINI 0.03*   ------------------------------------------------------------------------------------------------------------------  RADIOLOGY:  Dg Chest Port 1 View  Result Date: 05/29/2018 CLINICAL DATA:  Shortness of Breath EXAM: PORTABLE CHEST 1 VIEW COMPARISON:  12/30/2017 FINDINGS: Cardiomegaly with vascular congestion. Patchy bilateral lower lobe airspace opacities and small effusions. Airspace opacities in the right lung base have increased. No acute bony abnormality. IMPRESSION: Patchy bilateral lower lobe airspace opacities, increasing on the right since prior study. This could represent edema or infection. Cardiomegaly. Small bilateral effusions. Electronically Signed   By: Rolm Baptise M.D.   On: 05/29/2018 10:30     ASSESSMENT AND PLAN:   83 year old male with past medical history peripheral vascular disease, peptic ulcer disease, hypertension, hyperlipidemia, COPD, diabetes, history of carotid artery stenosis, history of B-cell lymphoma who presented to the hospital due to shortness of breath.  1.  Acute on chronic CHF exacerbation-likely diastolic dysfunction.  Last echocardiogram 2 years ago showing EF of 55% and repeated during this  hospitalization showing EF of 55-60%.  -Continue diuresis  with IV Lasix and patient has clinically improved.  Creatinine starting to rise a bit and therefore will taper Lasix to daily -Follow I's and O's and daily weights.  2.  Acute on chronic COPD exacerbation with possible community-acquired pneumonia - cont. IV steroids, duo nebs, and empiric Ceftriaxone, Zithromax.  -Continue supplemental oxygen as patient chronically  3.  Diabetes mellitus- appreciate DM coordinator input.  - started on Levemir, Novolog with meals. Cont. SSI and follow BS - BS elevated due to IV steroids.   4.  Hypertension-continue Imdur, metoprolol, Norvasc and hydralazine.   - BP stable.   5.  GERD- cont. Protonix  6.  Marginal zone lymphoma-follow-up with oncology as outpatient at [facility]  7.  Hyperlipidemia-continue Crestor.  Seen by physical therapy and the recommend outpatient services.  Will discharge home likely tomorrow if clinically doing well.   All the records are reviewed and case discussed with Care Management/Social Worker. Management plans discussed with the patient, family and they are in agreement.  CODE STATUS: DNR  DVT Prophylaxis: Lovenox  TOTAL TIME TAKING CARE OF THIS PATIENT: 30 minutes.   POSSIBLE D/C IN 1-2 DAYS, DEPENDING ON CLINICAL CONDITION.   Henreitta Leber M.D on 05/30/2018 at 3:48 PM  Between 7am to 6pm - Pager - [phone]  After 6pm go to [email or web address] - Proofreader  [facility]  Office  [phone]  CC: Primary care physician; Perrin Maltese, MD

## 2018-05-30 NOTE — Progress Notes (Signed)
Inpatient Diabetes Program Recommendations  AACE/ADA: New Consensus Statement on Inpatient Glycemic Control (2015)  Target Ranges:  Prepandial:   less than 140 mg/dL      Peak postprandial:   less than 180 mg/dL (1-2 hours)      Critically ill patients:  140 - 180 mg/dL   Lab Results  Component Value Date   GLUCAP 248 (H) 05/30/2018   HGBA1C 6.0 (H) 09/22/2017   Results for LAVORIS, SPARLING (MRN [id number]) as of 05/30/2018 07:52  Ref. Range 05/29/2018 17:33 05/29/2018 20:43 05/30/2018 07:33  Glucose-Capillary Latest Ref Range: 70 - 99 mg/dL 344 (H) 433 (H) 248 (H)    Diabetes history:  Type 2 DM Outpatient Diabetes medications: Amaryl 1 mg bid Current orders for Inpatient glycemic control: Novolog moderate tid with meals and HS Amaryl 1 mg bid, Solumedrol 60 mg daily Inpatient Diabetes Program Recommendations:    Please consider adding Levemir 12 units daily.  Also consider adding Novolog meal coverage 3 units tid with meals. Please recheck A1C as well.   Thanks,  Adah Perl, RN, BC-ADM Inpatient Diabetes Coordinator Pager [phone] (8a-5p)

## 2018-05-31 DIAGNOSIS — J441 Chronic obstructive pulmonary disease with (acute) exacerbation: Secondary | ICD-10-CM | POA: Diagnosis not present

## 2018-05-31 DIAGNOSIS — I110 Hypertensive heart disease with heart failure: Secondary | ICD-10-CM | POA: Diagnosis not present

## 2018-05-31 LAB — GLUCOSE, CAPILLARY
GLUCOSE-CAPILLARY: 176 mg/dL — AB (ref 70–99)
GLUCOSE-CAPILLARY: 202 mg/dL — AB (ref 70–99)
Glucose-Capillary: 203 mg/dL — ABNORMAL HIGH (ref 70–99)

## 2018-05-31 LAB — BASIC METABOLIC PANEL
Anion gap: 11 (ref 5–15)
BUN: 63 mg/dL — ABNORMAL HIGH (ref 8–23)
CHLORIDE: 101 mmol/L (ref 98–111)
CO2: 30 mmol/L (ref 22–32)
Calcium: 8.3 mg/dL — ABNORMAL LOW (ref 8.9–10.3)
Creatinine, Ser: 2.02 mg/dL — ABNORMAL HIGH (ref 0.61–1.24)
GFR calc Af Amer: 34 mL/min — ABNORMAL LOW (ref >60)
GFR, EST NON AFRICAN AMERICAN: 29 mL/min — AB (ref >60)
GLUCOSE: 211 mg/dL — AB (ref 70–99)
POTASSIUM: 4.3 mmol/L (ref 3.5–5.1)
Sodium: 142 mmol/L (ref 135–145)

## 2018-05-31 MED ORDER — MOMETASONE FURO-FORMOTEROL FUM 100-5 MCG/ACT IN AERO: 2.0000 | INHALATION_SPRAY | Freq: Two times a day (BID) | RESPIRATORY_TRACT | 0 refills | Status: DC

## 2018-05-31 MED ORDER — TIOTROPIUM BROMIDE MONOHYDRATE 18 MCG IN CAPS: 18.0000 ug | ORAL_CAPSULE | Freq: Every day | RESPIRATORY_TRACT | 0 refills | Status: AC

## 2018-05-31 MED ORDER — PREDNISONE 10 MG PO TABS: ORAL_TABLET | ORAL | 0 refills | Status: DC

## 2018-05-31 MED ORDER — FUROSEMIDE 40 MG PO TABS: 20.0000 mg | ORAL_TABLET | Freq: Every day | ORAL | 0 refills | Status: DC

## 2018-05-31 MED ORDER — IPRATROPIUM-ALBUTEROL 0.5-2.5 (3) MG/3ML IN SOLN: 3.0000 mL | Freq: Four times a day (QID) | RESPIRATORY_TRACT | Status: DC

## 2018-05-31 NOTE — Discharge Summary (Signed)
[facility] [place] NAME: Brett [name]    MR#:  [id number]  DATE OF BIRTH:  September 19, 1935  DATE OF ADMISSION:  05/29/2018 ADMITTING PHYSICIAN: Gladstone Lighter, MD  DATE OF DISCHARGE: 05/31/2018  1:00 PM  PRIMARY CARE PHYSICIAN: Perrin Maltese, MD    ADMISSION DIAGNOSIS:  COPD exacerbation ([place]) [J44.1] Acute congestive heart failure, unspecified heart failure type ([place]) [I50.9]  DISCHARGE DIAGNOSIS:  Active Problems:   CHF exacerbation ([place])   SECONDARY DIAGNOSIS:   Past Medical History:  Diagnosis Date  . Anemia   . Aortic regurgitation   . B-cell lymphoma ([place]) 2009   DX AT [facility]  . Bronchitis   . CAD (coronary artery disease)   . Carotid stenosis   . Diabetes mellitus without complication ([place])   . Diabetes mellitus, type 2 ([place])   . Emphysema of lung ([place])   . Essential hypertension   . History of chemotherapy   . Hyperlipidemia   . Hypertension   . IDA (iron deficiency anemia)   . Leg edema   . Meralgia paraesthetica   . Mitral regurgitation   . PUD (peptic ulcer disease)   . PVD (peripheral vascular disease) ([place])   . Tobacco abuse     HOSPITAL COURSE:   83 year old male with past medical history peripheral vascular disease, peptic ulcer disease, hypertension, hyperlipidemia, COPD, diabetes, history of carotid artery stenosis, history of B-cell lymphoma who presented to the hospital due to shortness of breath.  1. Acute on chronic CHF exacerbation - acute on chronic diastolic dysfunction. Last echocardiogram 2 years ago showing EF of 55% and repeated during this hospitalization showing EF of 55-60%.  - Patient was diuresed with IV Lasix and responded to it. He is about 4.2 L (-) since admission.  - He is now being discharged on a small dose of oral Lasix.   2. Acute on chronic COPD exacerbation with possible community-acquired pneumonia - pt. Was treated IV steroids, scheduled duonebs, pulmicort nebx and  empiric abx with Cefriaxone, zithromax.  -He has clinically improved, and is afebrile and hemodynamically stable.  He will be discharged on oral prednisone taper acute.  He will continue his maintenance inhalers.  He was not discharged on antibiotics.  3. Diabetes mellitus- patient's blood sugars were somewhat elevated in the hospital due to IV steroids.  He was placed on some Lantus and given some sliding scale insulin.  He will resume his glimepiride upon discharge.  His steroids are quickly being tapered over the next few days.  4. Hypertension- he will continue Imdur, metoprolol, Norvasc and hydralazine.  - BP stable while in the hospital.   5. GERD- pt. Will cont. Protonix  6. Marginal zone lymphoma-follow-up with oncology as outpatient at [facility]  7.  Hyperlipidemia- pt. Will continue Crestor.    DISCHARGE CONDITIONS:   Stable  CONSULTS OBTAINED:    DRUG ALLERGIES:  No Known Allergies  DISCHARGE MEDICATIONS:   Allergies as of 05/31/2018   No Known Allergies     Medication List    STOP taking these medications   Fluticasone-Salmeterol 250-50 MCG/DOSE Aepb Commonly known as:  ADVAIR DISKUS   nicotine 7 mg/24hr patch Commonly known as:  NICODERM CQ - dosed in mg/24 hr   predniSONE 10 MG (21) Tbpk tablet Commonly known as:  STERAPRED UNI-PAK 21 TAB Replaced by:  predniSONE 10 MG tablet   senna-docusate 8.6-50 MG tablet Commonly known as:  Senokot-S     TAKE these medications  albuterol 108 (90 Base) MCG/ACT inhaler Commonly known as:  PROVENTIL HFA;VENTOLIN HFA Inhale 2 puffs into the lungs every 6 (six) hours as needed for wheezing or shortness of breath.   amLODipine 5 MG tablet Commonly known as:  NORVASC Take 5 mg by mouth daily.   clopidogrel 75 MG tablet Commonly known as:  PLAVIX Take 75 mg by mouth daily.   fluticasone 50 MCG/ACT nasal spray Commonly known as:  FLONASE Place 2 sprays into the nose daily as needed for allergies.    furosemide 40 MG tablet Commonly known as:  LASIX Take 0.5 tablets (20 mg total) by mouth daily.   gabapentin 100 MG capsule Commonly known as:  NEURONTIN Take 1 capsule by mouth 2 (two) times daily.   glimepiride 1 MG tablet Commonly known as:  AMARYL Take 1 mg by mouth 2 (two) times daily.   hydrALAZINE 100 MG tablet Commonly known as:  APRESOLINE Take 100 mg by mouth 3 (three) times daily.   isosorbide mononitrate 30 MG 24 hr tablet Commonly known as:  IMDUR Take 30 mg by mouth daily.   metoprolol 200 MG 24 hr tablet Commonly known as:  TOPROL-XL Take 1 tablet (200 mg total) by mouth daily.   mometasone-formoterol 100-5 MCG/ACT Aero Commonly known as:  DULERA Inhale 2 puffs into the lungs 2 (two) times daily.   pantoprazole 40 MG tablet Commonly known as:  PROTONIX Take 40 mg by mouth daily.   predniSONE 10 MG tablet Commonly known as:  DELTASONE Label  & dispense according to the schedule below. 5 Pills PO for 1 day then, 4 Pills PO for 1 day, 3 Pills PO for 1 day, 2 Pills PO for 1 day, 1 Pill PO for 1 days then STOP. Replaces:  predniSONE 10 MG (21) Tbpk tablet   rosuvastatin 40 MG tablet Commonly known as:  CRESTOR Take 40 mg by mouth daily.   tiotropium 18 MCG inhalation capsule Commonly known as:  SPIRIVA HANDIHALER Place 1 capsule (18 mcg total) into inhaler and inhale daily.         DISCHARGE INSTRUCTIONS:   DIET:  Cardiac diet  DISCHARGE CONDITION:  Stable  ACTIVITY:  Activity as tolerated  OXYGEN:  Home Oxygen: Yes.     Oxygen Delivery: 2 liters/min via Patient connected to nasal cannula oxygen  DISCHARGE LOCATION:  Home with Home Health RN, PT   If you experience worsening of your admission symptoms, develop shortness of breath, life threatening emergency, suicidal or homicidal thoughts you must seek medical attention immediately by calling 911 or calling your MD immediately  if symptoms less severe.  You Must read complete  instructions/literature along with all the possible adverse reactions/side effects for all the Medicines you take and that have been prescribed to you. Take any new Medicines after you have completely understood and accpet all the possible adverse reactions/side effects.   Please note  You were cared for by a hospitalist during your hospital stay. If you have any questions about your discharge medications or the care you received while you were in the hospital after you are discharged, you can call the unit and asked to speak with the hospitalist on call if the hospitalist that took care of you is not available. Once you are discharged, your primary care physician will handle any further medical issues. Please note that NO REFILLS for any discharge medications will be authorized once you are discharged, as it is imperative that you return to your primary care  physician (or establish a relationship with a primary care physician if you do not have one) for your aftercare needs so that they can reassess your need for medications and monitor your lab values.     Today   Shortness of breath much improved since yesterday.  No further wheezing, bronchospasm.  Patient wants to go home.  VITAL SIGNS:  Blood pressure 137/74, pulse 84, temperature 97.6 F (36.4 C), temperature source Oral, resp. rate 15, height 5\' 10"  (1.778 m), weight 90.6 kg (199 lb 11.2 oz), SpO2 99 %.  I/O:    Intake/Output Summary (Last 24 hours) at 05/31/2018 1312 Last data filed at 05/31/2018 1048 Gross per 24 hour  Intake 963 ml  Output 2105 ml  Net -1142 ml    PHYSICAL EXAMINATION:   GENERAL:  83 y.o.-year-old patient lying in bed in no acute distress.  EYES: Pupils equal, round, reactive to light and accommodation. No scleral icterus. Extraocular muscles intact.  HEENT: Head atraumatic, normocephalic. Oropharynx and nasopharynx clear.  NECK:  Supple, no jugular venous distention. No thyroid enlargement, no tenderness.   LUNGS: Normal breath sounds bilaterally, no wheezing, rales, rhonchi. No use of accessory muscles of respiration.  CARDIOVASCULAR: S1, S2 normal. No murmurs, rubs, or gallops.  ABDOMEN: Soft, nontender, nondistended. Bowel sounds present. No organomegaly or mass.  EXTREMITIES: No cyanosis, clubbing, +1-2 edema b/l.     NEUROLOGIC: Cranial nerves II through XII are intact. No focal Motor or sensory deficits b/l.   PSYCHIATRIC: The patient is alert and oriented x 3.  SKIN: No obvious rash, lesion, or ulcer.    DATA REVIEW:   CBC Recent Labs  Lab 05/30/18 0351  WBC 9.7  HGB 9.2*  HCT 27.7*  PLT 201    Chemistries  Recent Labs  Lab 05/31/18 0451  NA 142  K 4.3  CL 101  CO2 30  GLUCOSE 211*  BUN 63*  CREATININE 2.02*  CALCIUM 8.3*    Cardiac Enzymes Recent Labs  Lab 05/30/18 0351  TROPONINI 0.03*    Microbiology Results  Results for orders placed or performed during the hospital encounter of 12/30/17  Blood culture (routine x 2)     Status: None   Collection Time: 12/30/17  3:20 PM  Result Value Ref Range Status   Specimen Description BLOOD RIGHT ANTECUBITAL  Final   Special Requests   Final    BOTTLES DRAWN AEROBIC AND ANAEROBIC Blood Culture adequate volume   Culture   Final    NO GROWTH 5 DAYS Performed at [facility], [address] [place], [place] [address]    Report Status 01/04/2018 FINAL  Final  Blood culture (routine x 2)     Status: None   Collection Time: 12/30/17  4:38 PM  Result Value Ref Range Status   Specimen Description BLOOD BLOOD LEFT HAND  Final   Special Requests   Final    BOTTLES DRAWN AEROBIC AND ANAEROBIC Blood Culture adequate volume   Culture   Final    NO GROWTH 5 DAYS Performed at [facility], [address], [place],  [address]    Report Status 01/04/2018 FINAL  Final  MRSA PCR Screening     Status: None   Collection Time: 12/31/17  7:59 AM  Result Value Ref Range Status   MRSA by PCR  NEGATIVE NEGATIVE Final    Comment:        The GeneXpert MRSA Assay (FDA approved for NASAL specimens only), is one component of  a comprehensive MRSA colonization surveillance program. It is not intended to diagnose MRSA infection nor to guide or monitor treatment for MRSA infections. Performed at [facility], [address], [place], [place] [address]     RADIOLOGY:  No results found.    Management plans discussed with the patient, family and they are in agreement.  CODE STATUS:     Code Status Orders  (From admission, onward)        Start     Ordered   05/29/18 1513  Do not attempt resuscitation (DNR)  Continuous    Question Answer Comment  In the event of cardiac or respiratory ARREST Do not call a "code blue"   In the event of cardiac or respiratory ARREST Do not perform Intubation, CPR, defibrillation or ACLS   In the event of cardiac or respiratory ARREST Use medication by any route, position, wound care, and other measures to relive pain and suffering. May use oxygen, suction and manual treatment of airway obstruction as needed for comfort.      05/29/18 1513      TOTAL TIME TAKING CARE OF THIS PATIENT: 40 minutes.    Henreitta Leber M.D on 05/31/2018 at 1:12 PM  Between 7am to 6pm - Pager - [phone]  After 6pm go to [email or web address] - Proofreader  [facility]  Office  [phone]  CC: Primary care physician; Perrin Maltese, MD

## 2018-05-31 NOTE — Care Management Note (Signed)
Case Management Note  Patient Details  Name: Brett [name] MRN: [id number] Date of Birth: 29-Oct-1935   Patient discharged home today.  Patient states that he lives alone in a duplex, and has no family that lives locally.  Patient states that his neighbor in the duplex checks on him frequently.  Patient has as close friend name Brett [name].  Brett [name] comes to the house daily to check on him, and also provides transportation.  Patient has chronic O2 through [facility] [facility], and has had home health through [facility] in the past. Patient states that he has a RW and cane in the home.  Home health orders have been written for RN, and PT along with CHF protocol.  Patient agreeable to home health services and would like to use [facility] again.  Referral made to [facility] with [facility] and was provided signed CHF protocol.  RNCM signing off.   Subjective/Objective:                    Action/Plan:   Expected Discharge Date:  05/31/18               Expected Discharge Plan:  [place]  In-House Referral:     Discharge planning Services  CM Consult  Post Acute Care Choice:  Home Health Choice offered to:  Patient  DME Arranged:    DME Agency:     HH Arranged:  RN, PT [place] Agency:  [facility]  Status of Service:  Completed, signed off  If discussed at [place] of Stay Meetings, dates discussed:    Additional Comments:  [name], RN 05/31/2018, 3:44 PM

## 2018-05-31 NOTE — Progress Notes (Signed)
Pt discharged to home via wc.  Instructions and rx given to pt.  Questions answered.  No distress.  

## 2018-06-12 ENCOUNTER — Telehealth: Payer: Self-pay | Admitting: Family

## 2018-06-12 ENCOUNTER — Ambulatory Visit: Payer: Medicare PPO | Admitting: Family

## 2018-06-12 NOTE — Telephone Encounter (Signed)
Patient did not show for his [facility] appointment on 06/12/18. Will attempt to reschedule.

## 2018-08-23 ENCOUNTER — Other Ambulatory Visit: Payer: Self-pay

## 2018-08-23 ENCOUNTER — Inpatient Hospital Stay
Admission: EM | Admit: 2018-08-23 | Discharge: 2018-08-25 | DRG: 638 | Disposition: A | Discharge status: Home Health | Payer: Medicare Other | Attending: Specialist | Admitting: Specialist

## 2018-08-23 DIAGNOSIS — Z9981 Dependence on supplemental oxygen: Secondary | ICD-10-CM | POA: Diagnosis not present

## 2018-08-23 DIAGNOSIS — I129 Hypertensive chronic kidney disease with stage 1 through stage 4 chronic kidney disease, or unspecified chronic kidney disease: Secondary | ICD-10-CM | POA: Diagnosis not present

## 2018-08-23 DIAGNOSIS — J439 Emphysema, unspecified: Secondary | ICD-10-CM | POA: Diagnosis not present

## 2018-08-23 DIAGNOSIS — E871 Hypo-osmolality and hyponatremia: Secondary | ICD-10-CM | POA: Diagnosis not present

## 2018-08-23 DIAGNOSIS — Z87891 Personal history of nicotine dependence: Secondary | ICD-10-CM | POA: Diagnosis not present

## 2018-08-23 DIAGNOSIS — R739 Hyperglycemia, unspecified: Secondary | ICD-10-CM

## 2018-08-23 DIAGNOSIS — E785 Hyperlipidemia, unspecified: Secondary | ICD-10-CM | POA: Diagnosis not present

## 2018-08-23 DIAGNOSIS — E1151 Type 2 diabetes mellitus with diabetic peripheral angiopathy without gangrene: Secondary | ICD-10-CM | POA: Diagnosis not present

## 2018-08-23 DIAGNOSIS — E875 Hyperkalemia: Secondary | ICD-10-CM | POA: Diagnosis present

## 2018-08-23 DIAGNOSIS — Z8711 Personal history of peptic ulcer disease: Secondary | ICD-10-CM | POA: Diagnosis not present

## 2018-08-23 DIAGNOSIS — Z7984 Long term (current) use of oral hypoglycemic drugs: Secondary | ICD-10-CM

## 2018-08-23 DIAGNOSIS — Z7902 Long term (current) use of antithrombotics/antiplatelets: Secondary | ICD-10-CM

## 2018-08-23 DIAGNOSIS — K219 Gastro-esophageal reflux disease without esophagitis: Secondary | ICD-10-CM | POA: Diagnosis not present

## 2018-08-23 DIAGNOSIS — E1122 Type 2 diabetes mellitus with diabetic chronic kidney disease: Secondary | ICD-10-CM | POA: Diagnosis not present

## 2018-08-23 DIAGNOSIS — Z9221 Personal history of antineoplastic chemotherapy: Secondary | ICD-10-CM | POA: Diagnosis not present

## 2018-08-23 DIAGNOSIS — N179 Acute kidney failure, unspecified: Secondary | ICD-10-CM | POA: Diagnosis not present

## 2018-08-23 DIAGNOSIS — E1140 Type 2 diabetes mellitus with diabetic neuropathy, unspecified: Secondary | ICD-10-CM | POA: Diagnosis present

## 2018-08-23 DIAGNOSIS — Z8572 Personal history of non-Hodgkin lymphomas: Secondary | ICD-10-CM

## 2018-08-23 DIAGNOSIS — Z8249 Family history of ischemic heart disease and other diseases of the circulatory system: Secondary | ICD-10-CM | POA: Diagnosis not present

## 2018-08-23 DIAGNOSIS — N183 Chronic kidney disease, stage 3 (moderate): Secondary | ICD-10-CM | POA: Diagnosis present

## 2018-08-23 DIAGNOSIS — I2510 Atherosclerotic heart disease of native coronary artery without angina pectoris: Secondary | ICD-10-CM | POA: Diagnosis present

## 2018-08-23 DIAGNOSIS — E1100 Type 2 diabetes mellitus with hyperosmolarity without nonketotic hyperglycemic-hyperosmolar coma (NKHHC): Secondary | ICD-10-CM | POA: Diagnosis not present

## 2018-08-23 DIAGNOSIS — E860 Dehydration: Secondary | ICD-10-CM | POA: Diagnosis present

## 2018-08-23 LAB — BASIC METABOLIC PANEL
ANION GAP: 10 (ref 5–15)
BUN: 26 mg/dL — ABNORMAL HIGH (ref 8–23)
CALCIUM: 8.6 mg/dL — AB (ref 8.9–10.3)
CHLORIDE: 87 mmol/L — AB (ref 98–111)
CO2: 31 mmol/L (ref 22–32)
Creatinine, Ser: 2.12 mg/dL — ABNORMAL HIGH (ref 0.61–1.24)
GFR calc non Af Amer: 28 mL/min — ABNORMAL LOW (ref >60)
GFR, EST AFRICAN AMERICAN: 32 mL/min — AB (ref >60)
Glucose, Bld: 879 mg/dL (ref 70–99)
POTASSIUM: 5.6 mmol/L — AB (ref 3.5–5.1)
Sodium: 128 mmol/L — ABNORMAL LOW (ref 135–145)

## 2018-08-23 LAB — URINALYSIS, COMPLETE (UACMP) WITH MICROSCOPIC
BILIRUBIN URINE: NEGATIVE
Bacteria, UA: NONE SEEN
Glucose, UA: >=500 mg/dL — AB
HGB URINE DIPSTICK: NEGATIVE
Ketones, ur: NEGATIVE mg/dL
Leukocytes, UA: NEGATIVE
Nitrite: NEGATIVE
Protein, ur: NEGATIVE mg/dL
Specific Gravity, Urine: 1.022 (ref 1.005–1.030)
Squamous Epithelial / LPF: NONE SEEN (ref 0–5)
pH: 7 (ref 5.0–8.0)

## 2018-08-23 LAB — CBC
HCT: 32.3 % — ABNORMAL LOW (ref 40.0–52.0)
Hemoglobin: 10.5 g/dL — ABNORMAL LOW (ref 13.0–18.0)
MCH: 28.3 pg (ref 26.0–34.0)
MCHC: 32.5 g/dL (ref 32.0–36.0)
MCV: 87.1 fL (ref 80.0–100.0)
Platelets: 187 10*3/uL (ref 150–440)
RBC: 3.71 MIL/uL — AB (ref 4.40–5.90)
RDW: 15.3 % — ABNORMAL HIGH (ref 11.5–14.5)
WBC: 5.6 10*3/uL (ref 3.8–10.6)

## 2018-08-23 LAB — GLUCOSE, CAPILLARY
Glucose-Capillary: >600 mg/dL (ref 70–99)
Glucose-Capillary: 502 mg/dL (ref 70–99)

## 2018-08-23 MED ORDER — INSULIN ASPART 100 UNIT/ML ~~LOC~~ SOLN
12.0000 [IU] | Freq: Once | SUBCUTANEOUS | Status: AC
  Administered 2018-08-23: 12 [IU] via INTRAVENOUS
  Filled 2018-08-23: qty 1

## 2018-08-23 MED ORDER — SODIUM CHLORIDE 0.9 % IV BOLUS
1000.0000 mL | Freq: Once | INTRAVENOUS | Status: AC
  Administered 2018-08-23: 1000 mL via INTRAVENOUS

## 2018-08-23 NOTE — ED Provider Notes (Signed)
[facility] Emergency Department Provider Note  ____________________________________________   I have reviewed the triage vital signs and the nursing notes.   HISTORY  Chief Complaint Hyperglycemia and Shaking   History limited by: Not Limited   HPI Brett [name] is a 83 y.o. male who presents to the emergency department today because of concerns for shaking.  Patient states that he first noticed it this afternoon.  Primarily in his left hand.  The patient states that he has never had symptoms like this before.  He has history of diabetes.  States he has been drinking a lot of orange juice.  He is also had sodas recently.  Denies any recent illnesses.  No fevers.    Per medical record review patient has a history of diabetes  Past Medical History:  Diagnosis Date  . Anemia   . Aortic regurgitation   . 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 Bronchitis   . CAD (coronary artery disease)   . Carotid stenosis   . Diabetes mellitus without complication ([place])   . Diabetes mellitus, type 2 ([place])   . Emphysema of lung ([place])   . Essential hypertension   . History of chemotherapy   . Hyperlipidemia   . Hypertension   . IDA (iron deficiency anemia)   . Leg edema   . Meralgia paraesthetica   . Mitral regurgitation   . PUD (peptic ulcer disease)   . PVD (peripheral vascular disease) ([place])   . Tobacco abuse     Patient Active Problem List   Diagnosis Date Noted  . CHF exacerbation ([place]) 05/29/2018  . COPD exacerbation ([place]) 12/05/2017  . CAP (community acquired pneumonia) 11/13/2017  . Acute respiratory failure ([place]) 11/13/2017  . SIRS (systemic inflammatory response syndrome) ([place]) 11/13/2017  . Stroke ([place]) 12/02/2016  . Stroke (cerebrum) ([place]) 12/02/2016  . Leg weakness, bilateral 09/03/2016  . Chronic diastolic heart failure ([place]) 08/06/2016  . Pleural effusion 08/05/2016  . Sepsis ([place]) 07/10/2016  . HCAP (healthcare-associated  pneumonia) 07/10/2016  . HTN (hypertension) 07/10/2016  . Diabetes ([place]) 07/10/2016  . CAD (coronary artery disease) 07/10/2016  . COPD (chronic obstructive pulmonary disease) ([place]) 06/20/2016  . Chest pain 06/18/2016  . B-cell lymphoma ([place]) 10/14/2015    Past Surgical History:  Procedure Laterality Date  . CARDIAC CATHETERIZATION  08/15/2007  . CARDIAC CATHETERIZATION Right 07/13/2016   Procedure: Right/Left Heart Cath and Coronary Angiography;  Surgeon: Dionisio David, MD;  Location: [facility];  Service: Cardiovascular;  Laterality: Right;  . COLONOSCOPY  03/2013  . ESOPHAGOGASTRODUODENOSCOPY  03/2013    Prior to Admission medications   Medication Sig Start Date End Date Taking? Authorizing Provider  albuterol (PROVENTIL HFA;VENTOLIN HFA) 108 (90 Base) MCG/ACT inhaler Inhale 2 puffs into the lungs every 6 (six) hours as needed for wheezing or shortness of breath. 09/24/17   Loletha Grayer, MD  amLODipine (NORVASC) 5 MG tablet Take 5 mg by mouth daily.  08/27/15   [provider]  clopidogrel (PLAVIX) 75 MG tablet Take 75 mg by mouth daily.  08/27/15   [provider]  fluticasone (FLONASE) 50 MCG/ACT nasal spray Place 2 sprays into the nose daily as needed for allergies.    [provider]  furosemide (LASIX) 40 MG tablet Take 0.5 tablets (20 mg total) by mouth daily. 05/31/18 07/30/18  Henreitta Leber, MD  gabapentin (NEURONTIN) 100 MG capsule Take 1 capsule by mouth 2 (two) times daily.  09/22/15   [provider]  glimepiride (AMARYL) 1 MG tablet Take 1 mg by mouth 2 (two) times daily.    [provider]  hydrALAZINE (APRESOLINE) 100 MG tablet Take 100 mg by mouth 3 (three) times daily.    [provider]  isosorbide mononitrate (IMDUR) 30 MG 24 hr tablet Take 30 mg by mouth daily.    [provider]  metoprolol (TOPROL-XL) 200 MG 24 hr tablet Take 1 tablet (200 mg total) by mouth daily. 09/24/17   Loletha Grayer,  MD  mometasone-formoterol (DULERA) 100-5 MCG/ACT AERO Inhale 2 puffs into the lungs 2 (two) times daily. 05/31/18   Henreitta Leber, MD  pantoprazole (PROTONIX) 40 MG tablet Take 40 mg by mouth daily.    [provider]  predniSONE (DELTASONE) 10 MG tablet Label  & dispense according to the schedule below. 5 Pills PO for 1 day then, 4 Pills PO for 1 day, 3 Pills PO for 1 day, 2 Pills PO for 1 day, 1 Pill PO for 1 days then STOP. 05/31/18   Sainani, Belia Heman, MD  rosuvastatin (CRESTOR) 40 MG tablet Take 40 mg by mouth daily.    [provider]  tiotropium (SPIRIVA HANDIHALER) 18 MCG inhalation capsule Place 1 capsule (18 mcg total) into inhaler and inhale daily. 05/31/18   Henreitta Leber, MD    Allergies Patient has no known allergies.  Family History  Problem Relation Age of Onset  . CAD Mother   . Rheum arthritis Neg Hx   . Osteoarthritis Neg Hx   . Asthma Neg Hx   . Diabetes Neg Hx   . Cancer Neg Hx     Social History Social History   Tobacco Use  . Smoking status: Former Smoker    Types: Cigarettes    Last attempt to quit: 08/30/2015    Years since quitting: 2.9  . Smokeless tobacco: Never Used  Substance Use Topics  . Alcohol use: No    Frequency: Never  . Drug use: No    Review of Systems Constitutional: No fever/chills Eyes: No visual changes. ENT: No sore throat. Cardiovascular: Denies chest pain. Respiratory: Denies shortness of breath. Gastrointestinal: No abdominal pain.  No nausea, no vomiting.  No diarrhea.   Genitourinary: Negative for dysuria. Musculoskeletal: Positive for shaking of the left hand Skin: Negative for rash. Neurological: Negative for headaches, focal weakness or numbness.  ____________________________________________   PHYSICAL EXAM:  VITAL SIGNS: ED Triage Vitals  Enc Vitals Group     BP 08/23/18 2100 (!) 165/73     Pulse Rate 08/23/18 2100 88     Resp 08/23/18 2100 20     Temp 08/23/18 2100 97.7 F (36.5 C)      Temp Source 08/23/18 2100 Oral     SpO2 08/23/18 2100 96 %     Weight 08/23/18 2056 200 lb (90.7 kg)     Height 08/23/18 2056 5\' 10"  (1.778 m)     Head Circumference --      Peak Flow --      Pain Score 08/23/18 2101 0    Constitutional: Alert and oriented.  Eyes: Conjunctivae are normal.  ENT      Head: Normocephalic and atraumatic.      Nose: No congestion/rhinnorhea.      Mouth/Throat: Mucous membranes are moist.      Neck: No stridor. Hematological/Lymphatic/Immunilogical: No cervical lymphadenopathy. Cardiovascular: Normal rate, regular rhythm.  No murmurs, rubs, or gallops.  Respiratory: Normal respiratory  effort without tachypnea nor retractions. Breath sounds are clear and equal bilaterally. No wheezes/rales/rhonchi. Gastrointestinal: Soft and non tender. No rebound. No guarding.  Genitourinary: Deferred Musculoskeletal: Normal range of motion in all extremities. No lower extremity edema. Neurologic:  Normal speech and language.  Intermittent tremors of the left. Skin:  Skin is warm, dry and intact. No rash noted. Psychiatric: Mood and affect are normal. Speech and behavior are normal. Patient exhibits appropriate insight and judgment.  ____________________________________________    LABS (pertinent positives/negatives)  UA glucose >500 otherwise unremarkable CBC wbc 5.6, hgb 10.5, plt 187 BMP na 128, k 5.6, glu 879, cr 2.12  ____________________________________________   EKG  I, Nance Pear, attending physician, personally viewed and interpreted this EKG  EKG Time: 2102 Rate: 88 Rhythm: sinus rhythm Axis: normal Intervals: qtc 453 QRS: narrow ST changes: no st elevation Impression: normal ekg  ____________________________________________    RADIOLOGY  None  ____________________________________________   PROCEDURES  Procedures  ____________________________________________   INITIAL IMPRESSION / ASSESSMENT AND PLAN / ED  COURSE  Pertinent labs & imaging results that were available during my care of the patient were reviewed by me and considered in my medical decision making (see chart for details).   Patient presented to the emergency department today with concerns for intermittent left arm shaking.  Patient was found to have significant hyperglycemia.  Blood work without anion gap.  At this point I doubt DKA.  Patient will be given fluids and insulin.  Given history of heart failure will hold off on giving large-volume fluids here in the emergency department.  Will plan on admission.  Discussed plan with patient.  ____________________________________________   FINAL CLINICAL IMPRESSION(S) / ED DIAGNOSES  Final diagnoses:  Hyperglycemia     Note: This dictation was prepared with Dragon dictation. Any transcriptional errors that result from this process are unintentional     Nance Pear, MD 08/23/18 2307

## 2018-08-23 NOTE — ED Notes (Signed)
Pt stated that his left arm has been shaking on and off all day and he doesn't know why

## 2018-08-23 NOTE — ED Notes (Signed)
Pt is resting in bed with eyes closed. Respirations even/unlabored, chest rising and falling. Pt is easily aroused by calling his name.

## 2018-08-23 NOTE — ED Triage Notes (Signed)
Pt is brought in via EMS due to shaking all day per patient and he doesn't know why. EMS also stated that his glucose was running high. Pt denies any pain

## 2018-08-24 DIAGNOSIS — E1151 Type 2 diabetes mellitus with diabetic peripheral angiopathy without gangrene: Secondary | ICD-10-CM | POA: Diagnosis not present

## 2018-08-24 DIAGNOSIS — Z7902 Long term (current) use of antithrombotics/antiplatelets: Secondary | ICD-10-CM | POA: Diagnosis not present

## 2018-08-24 DIAGNOSIS — N179 Acute kidney failure, unspecified: Secondary | ICD-10-CM | POA: Diagnosis not present

## 2018-08-24 DIAGNOSIS — E875 Hyperkalemia: Secondary | ICD-10-CM | POA: Diagnosis not present

## 2018-08-24 DIAGNOSIS — I129 Hypertensive chronic kidney disease with stage 1 through stage 4 chronic kidney disease, or unspecified chronic kidney disease: Secondary | ICD-10-CM | POA: Diagnosis not present

## 2018-08-24 DIAGNOSIS — N183 Chronic kidney disease, stage 3 (moderate): Secondary | ICD-10-CM | POA: Diagnosis not present

## 2018-08-24 DIAGNOSIS — R739 Hyperglycemia, unspecified: Secondary | ICD-10-CM | POA: Diagnosis present

## 2018-08-24 DIAGNOSIS — Z87891 Personal history of nicotine dependence: Secondary | ICD-10-CM | POA: Diagnosis not present

## 2018-08-24 DIAGNOSIS — E1140 Type 2 diabetes mellitus with diabetic neuropathy, unspecified: Secondary | ICD-10-CM | POA: Diagnosis not present

## 2018-08-24 DIAGNOSIS — J439 Emphysema, unspecified: Secondary | ICD-10-CM | POA: Diagnosis not present

## 2018-08-24 DIAGNOSIS — E1100 Type 2 diabetes mellitus with hyperosmolarity without nonketotic hyperglycemic-hyperosmolar coma (NKHHC): Secondary | ICD-10-CM | POA: Diagnosis present

## 2018-08-24 DIAGNOSIS — E871 Hypo-osmolality and hyponatremia: Secondary | ICD-10-CM | POA: Diagnosis not present

## 2018-08-24 DIAGNOSIS — K219 Gastro-esophageal reflux disease without esophagitis: Secondary | ICD-10-CM | POA: Diagnosis not present

## 2018-08-24 DIAGNOSIS — E1122 Type 2 diabetes mellitus with diabetic chronic kidney disease: Secondary | ICD-10-CM | POA: Diagnosis not present

## 2018-08-24 DIAGNOSIS — Z7984 Long term (current) use of oral hypoglycemic drugs: Secondary | ICD-10-CM | POA: Diagnosis not present

## 2018-08-24 DIAGNOSIS — E860 Dehydration: Secondary | ICD-10-CM | POA: Diagnosis not present

## 2018-08-24 DIAGNOSIS — Z8572 Personal history of non-Hodgkin lymphomas: Secondary | ICD-10-CM | POA: Diagnosis not present

## 2018-08-24 DIAGNOSIS — Z8249 Family history of ischemic heart disease and other diseases of the circulatory system: Secondary | ICD-10-CM | POA: Diagnosis not present

## 2018-08-24 DIAGNOSIS — I2510 Atherosclerotic heart disease of native coronary artery without angina pectoris: Secondary | ICD-10-CM | POA: Diagnosis not present

## 2018-08-24 LAB — GLUCOSE, CAPILLARY
GLUCOSE-CAPILLARY: 420 mg/dL — AB (ref 70–99)
GLUCOSE-CAPILLARY: 471 mg/dL — AB (ref 70–99)
GLUCOSE-CAPILLARY: 234 mg/dL — AB (ref 70–99)
GLUCOSE-CAPILLARY: 280 mg/dL — AB (ref 70–99)
Glucose-Capillary: 450 mg/dL — ABNORMAL HIGH (ref 70–99)
Glucose-Capillary: 329 mg/dL — ABNORMAL HIGH (ref 70–99)
Glucose-Capillary: 269 mg/dL — ABNORMAL HIGH (ref 70–99)
Glucose-Capillary: 238 mg/dL — ABNORMAL HIGH (ref 70–99)

## 2018-08-24 LAB — BASIC METABOLIC PANEL
Anion gap: 10 (ref 5–15)
BUN: 25 mg/dL — AB (ref 8–23)
CALCIUM: 8.5 mg/dL — AB (ref 8.9–10.3)
CHLORIDE: 98 mmol/L (ref 98–111)
CO2: 31 mmol/L (ref 22–32)
CREATININE: 1.73 mg/dL — AB (ref 0.61–1.24)
GFR calc non Af Amer: 35 mL/min — ABNORMAL LOW (ref >60)
GFR, EST AFRICAN AMERICAN: 41 mL/min — AB (ref >60)
Glucose, Bld: 373 mg/dL — ABNORMAL HIGH (ref 70–99)
Potassium: 4 mmol/L (ref 3.5–5.1)
SODIUM: 139 mmol/L (ref 135–145)

## 2018-08-24 LAB — CBC
HCT: 28.8 % — ABNORMAL LOW (ref 40.0–52.0)
Hemoglobin: 9.9 g/dL — ABNORMAL LOW (ref 13.0–18.0)
MCH: 28.5 pg (ref 26.0–34.0)
MCHC: 34.4 g/dL (ref 32.0–36.0)
MCV: 82.8 fL (ref 80.0–100.0)
PLATELETS: 156 10*3/uL (ref 150–440)
RBC: 3.48 MIL/uL — ABNORMAL LOW (ref 4.40–5.90)
RDW: 15.6 % — ABNORMAL HIGH (ref 11.5–14.5)
WBC: 6 10*3/uL (ref 3.8–10.6)

## 2018-08-24 LAB — HEMOGLOBIN A1C
Hgb A1c MFr Bld: 11.9 % — ABNORMAL HIGH (ref 4.8–5.6)
Mean Plasma Glucose: 294.83 mg/dL

## 2018-08-24 MED ORDER — PANTOPRAZOLE SODIUM 40 MG PO TBEC
40.0000 mg | DELAYED_RELEASE_TABLET | Freq: Every day | ORAL | Status: DC
  Administered 2018-08-24 – 2018-08-25 (×2): 40 mg via ORAL
  Filled 2018-08-24 (×2): qty 1

## 2018-08-24 MED ORDER — ALBUTEROL SULFATE (2.5 MG/3ML) 0.083% IN NEBU: 2.5000 mg | INHALATION_SOLUTION | Freq: Four times a day (QID) | RESPIRATORY_TRACT | Status: DC | PRN

## 2018-08-24 MED ORDER — ONDANSETRON HCL 4 MG/2ML IJ SOLN: 4.0000 mg | Freq: Four times a day (QID) | INTRAMUSCULAR | Status: DC | PRN

## 2018-08-24 MED ORDER — ACETAMINOPHEN 325 MG PO TABS
650.0000 mg | ORAL_TABLET | Freq: Four times a day (QID) | ORAL | Status: DC | PRN
  Administered 2018-08-24: 650 mg via ORAL
  Filled 2018-08-24: qty 2

## 2018-08-24 MED ORDER — TRAZODONE HCL 50 MG PO TABS: 25.0000 mg | ORAL_TABLET | Freq: Every evening | ORAL | Status: DC | PRN

## 2018-08-24 MED ORDER — HEPARIN SODIUM (PORCINE) 5000 UNIT/ML IJ SOLN
5000.0000 [IU] | Freq: Three times a day (TID) | INTRAMUSCULAR | Status: DC
  Administered 2018-08-24 – 2018-08-25 (×4): 5000 [IU] via SUBCUTANEOUS
  Filled 2018-08-24 (×4): qty 1

## 2018-08-24 MED ORDER — BISACODYL 5 MG PO TBEC: 5.0000 mg | DELAYED_RELEASE_TABLET | Freq: Every day | ORAL | Status: DC | PRN

## 2018-08-24 MED ORDER — GABAPENTIN 100 MG PO CAPS
100.0000 mg | ORAL_CAPSULE | Freq: Two times a day (BID) | ORAL | Status: DC
Start: 2018-08-24 — End: 2018-08-25
  Administered 2018-08-24 – 2018-08-25 (×4): 100 mg via ORAL
  Filled 2018-08-24 (×4): qty 1

## 2018-08-24 MED ORDER — SODIUM CHLORIDE 0.9 % IV SOLN
INTRAVENOUS | Status: DC
  Administered 2018-08-24 (×2): via INTRAVENOUS

## 2018-08-24 MED ORDER — CLOPIDOGREL BISULFATE 75 MG PO TABS
75.0000 mg | ORAL_TABLET | Freq: Every day | ORAL | Status: DC
  Administered 2018-08-24 – 2018-08-25 (×2): 75 mg via ORAL
  Filled 2018-08-24 (×2): qty 1

## 2018-08-24 MED ORDER — MOMETASONE FURO-FORMOTEROL FUM 100-5 MCG/ACT IN AERO
2.0000 | INHALATION_SPRAY | Freq: Two times a day (BID) | RESPIRATORY_TRACT | Status: DC
  Administered 2018-08-24 – 2018-08-25 (×4): 2 via RESPIRATORY_TRACT
  Filled 2018-08-24: qty 8.8

## 2018-08-24 MED ORDER — LIVING WELL WITH DIABETES BOOK
Freq: Once | Status: AC
  Administered 2018-08-24: 14:00:00
  Filled 2018-08-24: qty 1

## 2018-08-24 MED ORDER — METOPROLOL SUCCINATE ER 50 MG PO TB24
200.0000 mg | ORAL_TABLET | Freq: Every day | ORAL | Status: DC
  Administered 2018-08-24 – 2018-08-25 (×2): 200 mg via ORAL
  Filled 2018-08-24 (×2): qty 4

## 2018-08-24 MED ORDER — ONDANSETRON HCL 4 MG PO TABS: 4.0000 mg | ORAL_TABLET | Freq: Four times a day (QID) | ORAL | Status: DC | PRN

## 2018-08-24 MED ORDER — FLUTICASONE PROPIONATE 50 MCG/ACT NA SUSP
2.0000 | Freq: Every day | NASAL | Status: DC | PRN
  Filled 2018-08-24: qty 16

## 2018-08-24 MED ORDER — ISOSORBIDE MONONITRATE ER 30 MG PO TB24
30.0000 mg | ORAL_TABLET | Freq: Every day | ORAL | Status: DC
  Administered 2018-08-24 – 2018-08-25 (×2): 30 mg via ORAL
  Filled 2018-08-24 (×2): qty 1

## 2018-08-24 MED ORDER — INSULIN ASPART 100 UNIT/ML ~~LOC~~ SOLN
0.0000 [IU] | Freq: Every day | SUBCUTANEOUS | Status: DC
  Administered 2018-08-24: 5 [IU] via SUBCUTANEOUS
  Administered 2018-08-24: 2 [IU] via SUBCUTANEOUS
  Filled 2018-08-24 (×2): qty 1

## 2018-08-24 MED ORDER — DOCUSATE SODIUM 100 MG PO CAPS
100.0000 mg | ORAL_CAPSULE | Freq: Two times a day (BID) | ORAL | Status: DC
  Administered 2018-08-24 – 2018-08-25 (×4): 100 mg via ORAL
  Filled 2018-08-24 (×4): qty 1

## 2018-08-24 MED ORDER — ROSUVASTATIN CALCIUM 10 MG PO TABS
40.0000 mg | ORAL_TABLET | Freq: Every day | ORAL | Status: DC
  Administered 2018-08-24: 40 mg via ORAL
  Filled 2018-08-24: qty 4

## 2018-08-24 MED ORDER — INSULIN GLARGINE 100 UNIT/ML ~~LOC~~ SOLN
10.0000 [IU] | SUBCUTANEOUS | Status: AC
  Administered 2018-08-24: 10 [IU] via SUBCUTANEOUS
  Filled 2018-08-24: qty 0.1

## 2018-08-24 MED ORDER — HYDRALAZINE HCL 50 MG PO TABS
100.0000 mg | ORAL_TABLET | Freq: Three times a day (TID) | ORAL | Status: DC
  Administered 2018-08-24 – 2018-08-25 (×3): 100 mg via ORAL
  Filled 2018-08-24 (×3): qty 2

## 2018-08-24 MED ORDER — ACETAMINOPHEN 650 MG RE SUPP: 650.0000 mg | Freq: Four times a day (QID) | RECTAL | Status: DC | PRN

## 2018-08-24 MED ORDER — INSULIN ASPART 100 UNIT/ML ~~LOC~~ SOLN
0.0000 [IU] | Freq: Three times a day (TID) | SUBCUTANEOUS | Status: DC
  Administered 2018-08-24: 5 [IU] via SUBCUTANEOUS
  Administered 2018-08-24 – 2018-08-25 (×4): 8 [IU] via SUBCUTANEOUS
  Filled 2018-08-24 (×5): qty 1

## 2018-08-24 MED ORDER — AMLODIPINE BESYLATE 5 MG PO TABS
5.0000 mg | ORAL_TABLET | Freq: Every day | ORAL | Status: DC
  Administered 2018-08-24 – 2018-08-25 (×2): 5 mg via ORAL
  Filled 2018-08-24 (×2): qty 1

## 2018-08-24 MED ORDER — TIOTROPIUM BROMIDE MONOHYDRATE 18 MCG IN CAPS
18.0000 ug | ORAL_CAPSULE | Freq: Every day | RESPIRATORY_TRACT | Status: DC
  Administered 2018-08-24 – 2018-08-25 (×2): 18 ug via RESPIRATORY_TRACT
  Filled 2018-08-24: qty 5

## 2018-08-24 MED ORDER — HYDROCODONE-ACETAMINOPHEN 5-325 MG PO TABS: 1.0000 | ORAL_TABLET | ORAL | Status: DC | PRN

## 2018-08-24 MED ORDER — GLIMEPIRIDE 1 MG PO TABS
1.0000 mg | ORAL_TABLET | Freq: Two times a day (BID) | ORAL | Status: DC
  Administered 2018-08-24 – 2018-08-25 (×4): 1 mg via ORAL
  Filled 2018-08-24 (×5): qty 1

## 2018-08-24 MED ORDER — FUROSEMIDE 20 MG PO TABS
20.0000 mg | ORAL_TABLET | Freq: Every day | ORAL | Status: DC
  Administered 2018-08-24 – 2018-08-25 (×2): 20 mg via ORAL
  Filled 2018-08-24 (×2): qty 1

## 2018-08-24 NOTE — Progress Notes (Signed)
[place] at [place] NAME: Brett [name]    MR#:  [id number]  DATE OF BIRTH:  1935/02/10  SUBJECTIVE:   Presented to the hospital due to significant hyperglycemia and noted to be in acute kidney injury.  Patient is very hard of hearing.  Patient apparently was on a prednisone taper for recent COPD flare and also drinking a lot of sodas and juices was which led to his hyperglycemia.  Patient presently denies any other complaints.  Sugars have improved.  REVIEW OF SYSTEMS:    Review of Systems  Constitutional: Negative for chills and fever.  HENT: Negative for congestion and tinnitus.   Eyes: Negative for blurred vision and double vision.  Respiratory: Negative for cough, shortness of breath and wheezing.   Cardiovascular: Negative for chest pain, orthopnea and PND.  Gastrointestinal: Negative for abdominal pain, diarrhea, nausea and vomiting.  Genitourinary: Negative for dysuria and hematuria.  Neurological: Negative for dizziness, sensory change and focal weakness.  All other systems reviewed and are negative.   Nutrition: Carb control Tolerating Diet: Yes Tolerating PT: AWait Eval.   DRUG ALLERGIES:  No Known Allergies  VITALS:  Blood pressure (!) 144/64, pulse 91, temperature 97.9 F (36.6 C), temperature source Oral, resp. rate 18, height 5\' 10"  (1.778 m), weight 87.7 kg, SpO2 99 %.  PHYSICAL EXAMINATION:   Physical Exam  GENERAL:  83 y.o.-year-old patient lying in bed very hard of hearing in NAD.  EYES: Pupils equal, round, reactive to light and accommodation. No scleral icterus. Extraocular muscles intact.  HEENT: Head atraumatic, normocephalic. Oropharynx and nasopharynx clear.  NECK:  Supple, no jugular venous distention. No thyroid enlargement, no tenderness.  LUNGS: Normal breath sounds bilaterally, no wheezing, rales, rhonchi. No use of accessory muscles of respiration.  CARDIOVASCULAR: S1, S2 normal. No murmurs, rubs,  or gallops.  ABDOMEN: Soft, nontender, nondistended. Bowel sounds present. No organomegaly or mass.  EXTREMITIES: No cyanosis, clubbing or edema b/l.    NEUROLOGIC: Cranial nerves II through XII are intact. No focal Motor or sensory deficits b/l.  PSYCHIATRIC: The patient is alert and oriented x 3.  SKIN: No obvious rash, lesion, or ulcer.    LABORATORY PANEL:   CBC Recent Labs  Lab 08/24/18 0507  WBC 6.0  HGB 9.9*  HCT 28.8*  PLT 156   ------------------------------------------------------------------------------------------------------------------  Chemistries  Recent Labs  Lab 08/24/18 0507  NA 139  K 4.0  CL 98  CO2 31  GLUCOSE 373*  BUN 25*  CREATININE 1.73*  CALCIUM 8.5*   ------------------------------------------------------------------------------------------------------------------  Cardiac Enzymes No results for input(s): TROPONINI in the last 168 hours. ------------------------------------------------------------------------------------------------------------------  RADIOLOGY:  No results found.   ASSESSMENT AND PLAN:   83 year old male with past medical history of COPD, hypertension, diabetes, hyperlipidemia, iron deficiency anemia, peripheral vascular disease, history of peptic ulcer disease who presented to the hospital due to generalized weakness tremors of his upper extremities and noted to have severe hyperglycemia.  1.  Hyperglycemia/uncontrolled diabetes- patient was recently on a prednisone taper for COPD flare and has been drinking a lot of juices and sodas which led to his severe hyperglycemia. - With Lantus, sliding scale insulin IV fluids patient's blood sugars have improved.  No evidence of DKA. - Appreciate diabetes coordinator input and will likely need to adjust his regimen prior to discharge.  Next line-await hemoglobin A1c results.  For now continue sliding scale insulin and low-dose Amaryl.  2.  Acute kidney injury- secondary to  severe  hyperglycemia and dehydration. - Improved with IV fluid hydration and will continue to monitor.  Renal dose meds, avoid nephrotoxins.  3.  Hyponatremia-this was pseudohyponatremia secondary to the severe hyperglycemia.  This has improved and resolved as his blood sugars have improved.  4.  COPD-no acute exacerbation -Continue Dulera, Spiriva.  5.  Essential hypertension-continue Toprol, Imdur, Norvasc.  6.  Diabetic neuropathy-continue gabapentin.  7.  GERD-continue Protonix.  8.  Hyperlipidemia-continue Crestor.   All the records are reviewed and case discussed with Care Management/Social Worker. Management plans discussed with the patient, family and they are in agreement.  CODE STATUS: Full code  DVT Prophylaxis: Hep SQ  TOTAL TIME TAKING CARE OF THIS PATIENT: 30 minutes.   POSSIBLE D/C IN 1-2 DAYS, DEPENDING ON CLINICAL CONDITION.   Henreitta Leber M.D on 08/24/2018 at 2:02 PM  Between 7am to 6pm - Pager - [phone]  After 6pm go to [email or web address] - Proofreader  [facility]  Office  [phone]  CC: Primary care physician; Perrin Maltese, MD

## 2018-08-24 NOTE — ED Notes (Signed)
Report called and to floor

## 2018-08-24 NOTE — Progress Notes (Addendum)
Inpatient Diabetes Program Recommendations  AACE/ADA: New Consensus Statement on Inpatient Glycemic Control (2019)  Target Ranges:  Prepandial:   less than 140 mg/dL      Peak postprandial:   less than 180 mg/dL (1-2 hours)      Critically ill patients:  140 - 180 mg/dL   Results for JOHNOTHAN, BASCOMB (MRN [id number]) as of 08/24/2018 09:34  Ref. Range 08/23/2018 21:00 08/23/2018 23:14 08/24/2018 01:07 08/24/2018 02:22 08/24/2018 05:30 08/24/2018 05:33 08/24/2018 07:48  Glucose-Capillary Latest Ref Range: 70 - 99 mg/dL >600 (HH)  Novolog 12 units 502 (HH) 450 (H) 420 (H)  Novolog 5 units  Amaryl 1mg   Lantus 10 units 471 (H) 329 (H) 269 (H)  Novolog 8 units  Amaryl 1 mg  Results for DOSS, CYBULSKI (MRN [id number]) as of 08/24/2018 09:34  Ref. Range 08/23/2018 21:01  Glucose Latest Ref Range: 70 - 99 mg/dL [facility])   Results for CHRLES, SELLEY (MRN [id number]) as of 08/24/2018 09:34  Ref. Range 05/30/2018 03:51  Hemoglobin A1C Latest Ref Range: 4.8 - 5.6 % 7.2 (H)    Review of Glycemic Control  Diabetes history: DM2 Outpatient Diabetes medications: Amaryl 1 mg daily (prescribed BID) Current orders for Inpatient glycemic control: Novolog 0-15 units TID with meals, Novolog 0-5 units QHS, Amaryl 1 mg BID  Inpatient Diabetes Program Recommendations:  Insulin - Basal: Noted patient received one time Lantus 10 units last night. Oral Agents: If glucose continues to be consistently greater than 180 mg/dl today, may want to consider increasing Amaryl to 2 mg BID.  NOTE: Initial glucose 879 mg/dl on 08/23/18. Per H&P, patient was recently on Prednisone taper for COPDE and has been drinking soda and juice over the past few weeks.  Will plan to talk with patient today.  Addendum 08/24/18@13 :60- Went to talk with patient regarding DM control. In trying to talk with patient, he reports that he does not hear well and he can not hear what is being said (even when talking very loudly). Attempted to  write questions down and have patient answer them verbally but patient reports he can not see well and he can no read the question. Patient stated that since he could not hear well, diabetes coordinator could talk with the nurse or his friend [name].  Talked with Raquel Sarna, RN and she confirms patient has a difficult time hearing and that perhaps patient's friend [name] could be called to discuss DM. Called Claudette Head [phone]) and he states that he has been taking care of patient for 2 years and he takes patient to his doctor appointments and helps with his medications. [name] states that patient does not check glucose at home because he has never been asked to do so and patient does not have a glucometer or testing supplies. He reports that patient is taking Metformin and Amaryl daily for DM control (not sure of the exact dosage of each). Metformin is not noted on home medication list but [name] reports he not certain, but thinks patient is taking Metformin along with Amaryl for DM control.  [name] reports that patient seen his PCP about 3-4 weeks ago and patient's glucose was checked and told it was okay. [name] reports that patient has to take Prednisone from time to time but it has been over 1 month since patient last took Prednisone. [name] reports that patient has been drinking a lot of orange juice, fruit punch, and soda lately. [name] states that patient has drank 2-3 containers (60 ounce) in  the past 2-3 days.  [name] admits that he thought juice was good for patient but now he understands he should not be drinking a lot of juice or other sweetened beverages. [name] reports that he has cleaned out patient's refrigerator at home and removed the juice, fruit punch, and regular sodas. Informed [name] that a Living Well with Diabetes book would be ordered and asked that he review the entire book and discuss with patient. Discussed glucose and A1C goals. Discussed importance of checking CBGs and  maintaining good CBG control to prevent long-term and short-term complications. Discussed impact of nutrition, exercise, stress, sickness, and medications on diabetes control. Discussed carbohydrates, carbohydrate goals per day and meal, along with portion sizes. Encouraged [name] to check patient's glucose at least once a day and to take glucose readings to follow up visit with PCP. [name] states he plans to call and make an appointment with patient's PCP for early next week.   [name] verbalized understanding of information discussed and he states that he has no further questions at this time related to diabetes.   Called office of Dr. [name] and spoke with nurse Kenney Houseman) and was informed that patient should be taking "Amaryl 1 mg BID and per the notes in the chart, patient has been off Metformin for a long time due to kidney function." Patient was last seen by PCP on 07/17/18 but glucose was not checked at that visit. However, glucose was checked on 05/05/18 and glucose was 147 mg/dl. Nurse also reports patient has not been prescribed Prednisone in the past 1 month.    MD, at time of discharge please provide Rx for: Glucometer and testing supplies.   Thanks, Barnie Alderman, RN, MSN, CDE Diabetes Coordinator Inpatient Diabetes Program [phone] (Team Pager from 8am to 5pm)

## 2018-08-24 NOTE — Plan of Care (Signed)
Patient on 3L O2 chronic. Blood sugars still running high, IV fluids infusing. Patient is hard of hearing. Compliant with hospital medication regimen at this time. Denies any pain at this time.

## 2018-08-24 NOTE — H&P (Signed)
[place] at [place] NAME: Brett [name]    MR#:  [id number]  DATE OF BIRTH:  07-15-1935  DATE OF ADMISSION:  08/23/2018  PRIMARY CARE PHYSICIAN: Perrin Maltese, MD   REQUESTING/REFERRING PHYSICIAN:   CHIEF COMPLAINT:   Chief Complaint  Patient presents with  . Hyperglycemia  . Shaking    HISTORY OF PRESENT ILLNESS: Brett [name]  is a 83 y.o. male with a known history of diabetes type 2, COPD, hypertension, CKD 3 and other comorbidities. Patient presented to emergency room for generalized weakness and bilateral upper extremities shaking, going on for the past 24 hours, gradually getting worse.  Patient denies having similar episodes in the past. He states that he is compliant with his medications, however he admits to drinking a lot of soda and orange juice in the past few weeks.  Patient was prescribed prednisone, recently for an acute COPD exacerbation. His upper extremities tremor improved in the emergency room with IV fluids and insulin treatment. Blood test done emergency room are remarkable for elevated blood sugar level at 879, sodium level 128, potassium 5.6, creatinine 2.12.  UA is negative for UTI. Patient is admitted for further evaluation and treatment.  PAST MEDICAL HISTORY:   Past Medical History:  Diagnosis Date  . Anemia   . Aortic regurgitation   . B-cell lymphoma ([place]) 2009   DX AT [facility]  . Bronchitis   . CAD (coronary artery disease)   . Carotid stenosis   . Diabetes mellitus without complication ([place])   . Diabetes mellitus, type 2 ([place])   . Emphysema of lung ([place])   . Essential hypertension   . History of chemotherapy   . Hyperlipidemia   . Hypertension   . IDA (iron deficiency anemia)   . Leg edema   . Meralgia paraesthetica   . Mitral regurgitation   . PUD (peptic ulcer disease)   . PVD (peripheral vascular disease) ([place])   . Tobacco abuse     PAST SURGICAL HISTORY:  Past Surgical History:   Procedure Laterality Date  . CARDIAC CATHETERIZATION  08/15/2007  . CARDIAC CATHETERIZATION Right 07/13/2016   Procedure: Right/Left Heart Cath and Coronary Angiography;  Surgeon: Dionisio David, MD;  Location: [facility];  Service: Cardiovascular;  Laterality: Right;  . COLONOSCOPY  03/2013  . ESOPHAGOGASTRODUODENOSCOPY  03/2013    SOCIAL HISTORY:  Social History   Tobacco Use  . Smoking status: Former Smoker    Types: Cigarettes    Last attempt to quit: 08/30/2015    Years since quitting: 2.9  . Smokeless tobacco: Never Used  Substance Use Topics  . Alcohol use: No    Frequency: Never    FAMILY HISTORY:  Family History  Problem Relation Age of Onset  . CAD Mother   . Rheum arthritis Neg Hx   . Osteoarthritis Neg Hx   . Asthma Neg Hx   . Diabetes Neg Hx   . Cancer Neg Hx     DRUG ALLERGIES: No Known Allergies  REVIEW OF SYSTEMS:   CONSTITUTIONAL: No fever, but positive for fatigue and generalized weakness.  EYES: No changes in vision.  EARS, NOSE, AND THROAT: No tinnitus or ear pain.  RESPIRATORY: No cough, shortness of breath, wheezing or hemoptysis.  CARDIOVASCULAR: No chest pain, orthopnea, edema.  GASTROINTESTINAL: No nausea, vomiting, diarrhea or abdominal pain.  GENITOURINARY: No dysuria, hematuria.  ENDOCRINE: No polyuria, nocturia. HEMATOLOGY: No bleeding. SKIN: No rash or lesion.  MUSCULOSKELETAL: Positive for bilateral hand tremor.  No joint pain at this time.   NEUROLOGIC: No focal weakness.  PSYCHIATRY: No anxiety or depression.   MEDICATIONS AT HOME:  Prior to Admission medications   Medication Sig Start Date End Date Taking? Authorizing Provider  albuterol (PROVENTIL HFA;VENTOLIN HFA) 108 (90 Base) MCG/ACT inhaler Inhale 2 puffs into the lungs every 6 (six) hours as needed for wheezing or shortness of breath. 09/24/17   Loletha Grayer, MD  amLODipine (NORVASC) 5 MG tablet Take 5 mg by mouth daily.  08/27/15   [provider]   clopidogrel (PLAVIX) 75 MG tablet Take 75 mg by mouth daily.  08/27/15   [provider]  fluticasone (FLONASE) 50 MCG/ACT nasal spray Place 2 sprays into the nose daily as needed for allergies.    [provider]  furosemide (LASIX) 40 MG tablet Take 0.5 tablets (20 mg total) by mouth daily. 05/31/18 07/30/18  Henreitta Leber, MD  gabapentin (NEURONTIN) 100 MG capsule Take 1 capsule by mouth 2 (two) times daily.  09/22/15   [provider]  glimepiride (AMARYL) 1 MG tablet Take 1 mg by mouth 2 (two) times daily.    [provider]  hydrALAZINE (APRESOLINE) 100 MG tablet Take 100 mg by mouth 3 (three) times daily.    [provider]  isosorbide mononitrate (IMDUR) 30 MG 24 hr tablet Take 30 mg by mouth daily.    [provider]  metoprolol (TOPROL-XL) 200 MG 24 hr tablet Take 1 tablet (200 mg total) by mouth daily. 09/24/17   Loletha Grayer, MD  mometasone-formoterol (DULERA) 100-5 MCG/ACT AERO Inhale 2 puffs into the lungs 2 (two) times daily. 05/31/18   Henreitta Leber, MD  pantoprazole (PROTONIX) 40 MG tablet Take 40 mg by mouth daily.    [provider]  predniSONE (DELTASONE) 10 MG tablet Label  & dispense according to the schedule below. 5 Pills PO for 1 day then, 4 Pills PO for 1 day, 3 Pills PO for 1 day, 2 Pills PO for 1 day, 1 Pill PO for 1 days then STOP. 05/31/18   Sainani, Belia Heman, MD  rosuvastatin (CRESTOR) 40 MG tablet Take 40 mg by mouth daily.    [provider]  tiotropium (SPIRIVA HANDIHALER) 18 MCG inhalation capsule Place 1 capsule (18 mcg total) into inhaler and inhale daily. 05/31/18   Henreitta Leber, MD      PHYSICAL EXAMINATION:   VITAL SIGNS: Blood pressure (!) 173/78, pulse 87, temperature 97.7 F (36.5 C), temperature source Oral, resp. rate 19, height 5\' 10"  (1.778 m), weight 90.7 kg, SpO2 98 %.  GENERAL:  83 y.o.-year-old patient lying in the bed with no acute distress.  EYES: Pupils equal, round,  reactive to light and accommodation. No scleral icterus. Extraocular muscles intact.  HEENT: Head atraumatic, normocephalic. Oropharynx and nasopharynx clear.  Bilateral hearing deficit noted. NECK:  Supple, no jugular venous distention. No thyroid enlargement, no tenderness.  LUNGS: Reduced breath sounds bilaterally, no wheezing, rales,rhonchi or crepitation. No use of accessory muscles of respiration.  CARDIOVASCULAR: S1, S2 normal. No S3/S4.  ABDOMEN: Soft, nontender, nondistended. Bowel sounds present. No organomegaly or mass.  EXTREMITIES: No pedal edema, cyanosis, or clubbing.  NEUROLOGIC: No focal weakness.  No hand tremor at this time. PSYCHIATRIC: The patient is alert and oriented x 3.  SKIN: No obvious rash, lesion, or ulcer.   LABORATORY PANEL:   CBC Recent Labs  Lab 08/23/18 2101  WBC 5.6  HGB 10.5*  HCT 32.3*  PLT 187  MCV 87.1  MCH 28.3  MCHC 32.5  RDW 15.3*   ------------------------------------------------------------------------------------------------------------------  Chemistries  Recent Labs  Lab 08/23/18 2101  NA 128*  K 5.6*  CL 87*  CO2 31  GLUCOSE 879*  BUN 26*  CREATININE 2.12*  CALCIUM 8.6*   ------------------------------------------------------------------------------------------------------------------ estimated creatinine clearance is 31 mL/min (A) (by C-G formula based on SCr of 2.12 mg/dL (H)). ------------------------------------------------------------------------------------------------------------------ No results for input(s): TSH, T4TOTAL, T3FREE, THYROIDAB in the last 72 hours.  Invalid input(s): FREET3   Coagulation profile No results for input(s): INR, PROTIME in the last 168 hours. ------------------------------------------------------------------------------------------------------------------- No results for input(s): DDIMER in the last 72  hours. -------------------------------------------------------------------------------------------------------------------  Cardiac Enzymes No results for input(s): CKMB, TROPONINI, MYOGLOBIN in the last 168 hours.  Invalid input(s): CK ------------------------------------------------------------------------------------------------------------------ Invalid input(s): POCBNP  ---------------------------------------------------------------------------------------------------------------  Urinalysis    Component Value Date/Time   COLORURINE STRAW (A) 08/23/2018 2101   APPEARANCEUR CLEAR (A) 08/23/2018 2101   LABSPEC 1.022 08/23/2018 2101   PHURINE 7.0 08/23/2018 2101   GLUCOSEU >=500 (A) 08/23/2018 2101   HGBUR NEGATIVE 08/23/2018 2101   [place] NEGATIVE 08/23/2018 2101   [place] NEGATIVE 08/23/2018 2101   PROTEINUR NEGATIVE 08/23/2018 2101   NITRITE NEGATIVE 08/23/2018 2101   LEUKOCYTESUR NEGATIVE 08/23/2018 2101     RADIOLOGY: No results found.  EKG: Orders placed or performed during the hospital encounter of 08/23/18  . EKG 12-Lead  . EKG 12-Lead  . EKG 12-Lead  . EKG 12-Lead    IMPRESSION AND PLAN:  1.  Hyperosmolar, nonketotic hyperglycemia.  Will start treatment with IV fluids and insulin.  Low-carb diet was discussed with patient in detail.  We will continue to monitor blood sugars before meals and at bedtime. 2.  Hyponatremia and hyperkalemia likely related to hyperglycemia.  We will continue to monitor electrolytes level closely while treating hyperglycemia. 3.  COPD, without acute exacerbation, continue maintenance therapy. 4.  CKD 3.  Creatinine level is stable, continue to monitor kidney function closely and avoid nephrotoxic medications. 5.  Hypertension, stable, continue home medications.   All the records are reviewed and case discussed with ED provider. Management plans discussed with the patient, who is in agreement.  CODE STATUS:DNR Code  Status History    Date Active Date Inactive Code Status Order ID Comments User Context   05/29/2018 1513 05/31/2018 1617 DNR [id number]  Gladstone Lighter, MD Inpatient   12/30/2017 1815 01/01/2018 1453 Full Code [id number]  Vaughan Basta, MD Inpatient   12/05/2017 2146 12/08/2017 1428 Full Code [id number]  Fritzi Mandes, MD Inpatient   11/13/2017 2123 11/16/2017 1704 Full Code [id number]  Idelle Crouch, MD Inpatient   09/22/2017 0036 09/24/2017 1837 Full Code [id number]  Salary, Avel Peace, MD Inpatient   09/22/2017 0033 09/22/2017 0036 DNR [id number]  Gorden Harms, MD ED   12/02/2016 0812 12/03/2016 2009 Full Code [id number]  Vaughan Basta, MD Inpatient   07/11/2016 0018 07/11/2016 1707 Full Code [id number]  Lance Coon, MD Inpatient   06/18/2016 0340 06/21/2016 1844 Full Code [id number]  Saundra Shelling, MD ED    Questions for Most Recent Historical Code Status (Order [id number])    Question Answer Comment   In the event of cardiac or respiratory ARREST Do not call a "code blue"    In the event of cardiac or respiratory ARREST Do not perform Intubation, CPR, defibrillation or ACLS    In the event of cardiac or respiratory ARREST Use medication by any route, position,  wound care, and other measures to relive pain and suffering. May use oxygen, suction and manual treatment of airway obstruction as needed for comfort.        TOTAL TIME TAKING CARE OF THIS PATIENT: 50 minutes.    Amelia Jo M.D on 08/24/2018 at 12:58 AM  Between 7am to 6pm - Pager - [phone]  After 6pm go to [email or web address] - password [email or web address] [facility]  [phone]  CC: Primary care physician; Perrin Maltese, MD

## 2018-08-24 NOTE — ED Notes (Signed)
[name] [phone]- Caregiver

## 2018-08-25 DIAGNOSIS — E1100 Type 2 diabetes mellitus with hyperosmolarity without nonketotic hyperglycemic-hyperosmolar coma (NKHHC): Secondary | ICD-10-CM | POA: Diagnosis not present

## 2018-08-25 DIAGNOSIS — R739 Hyperglycemia, unspecified: Secondary | ICD-10-CM | POA: Diagnosis not present

## 2018-08-25 LAB — BASIC METABOLIC PANEL
ANION GAP: 9 (ref 5–15)
BUN: 35 mg/dL — AB (ref 8–23)
CALCIUM: 8.5 mg/dL — AB (ref 8.9–10.3)
CO2: 29 mmol/L (ref 22–32)
CREATININE: 1.93 mg/dL — AB (ref 0.61–1.24)
Chloride: 101 mmol/L (ref 98–111)
GFR calc Af Amer: 36 mL/min — ABNORMAL LOW (ref >60)
GFR, EST NON AFRICAN AMERICAN: 31 mL/min — AB (ref >60)
GLUCOSE: 255 mg/dL — AB (ref 70–99)
Potassium: 4.1 mmol/L (ref 3.5–5.1)
Sodium: 139 mmol/L (ref 135–145)

## 2018-08-25 LAB — GLUCOSE, CAPILLARY
Glucose-Capillary: 256 mg/dL — ABNORMAL HIGH (ref 70–99)
Glucose-Capillary: 256 mg/dL — ABNORMAL HIGH (ref 70–99)

## 2018-08-25 MED ORDER — BLOOD GLUCOSE MONITOR KIT: PACK | 0 refills | Status: DC

## 2018-08-25 MED ORDER — GLIMEPIRIDE 2 MG PO TABS: 2.0000 mg | ORAL_TABLET | Freq: Two times a day (BID) | ORAL | 0 refills | Status: DC

## 2018-08-25 NOTE — Evaluation (Signed)
Physical Therapy Evaluation Patient Details Name: Brett [name] MRN: [id number] DOB: 1935-06-29 Today's Date: 08/25/2018   History of Present Illness  presented to ER secondary to generalized weakness, bilat UE shaking; admitted with hyperosmolar non-ketotic hyperglycemia.  Clinical Impression  Upon evaluation, patient alert and oriented to basic information; follows simple commands, though often requires manual cuing/gestures due to extreme HOH.  Bilat UE/LE strength and ROM grossly symmetrical and WFL; no focal weakness appreciated.  Able to complete bed mobility, sit/stand and basic transfers without assist device, mod indep; gait (170') without assist device, sup/mod indep.  Fair cadence and overall stability; completing dynamic gait activities without deviation or LOB. Vitals stable and WFL on 3L throughout session, maintaining sats >91% on 3L at rest and with exertion. Appears to be at baseline level of functional ability; no acute PT needs identified.  Patient in agreement.  Will complete initial PT order at this time.  Please re-consult should needs change.    Follow Up Recommendations No PT follow up    Equipment Recommendations       Recommendations for Other Services       Precautions / Restrictions Precautions Precautions: Fall Restrictions Weight Bearing Restrictions: No      Mobility  Bed Mobility Overal bed mobility: Modified Independent                Transfers Overall transfer level: Modified independent Equipment used: None             General transfer comment: Sit/stand without assist device, mod indep; good LE strength/control  Ambulation/Gait Ambulation/Gait assistance: Supervision;Modified independent (Device/Increase time) Gait Distance (Feet): 170 Feet Assistive device: None   Gait velocity: 10' walk time, 6 seconds   General Gait Details: reciprocal stepping pattern with fair step height/length; completes head turns, changes of  direction and obstacle negotiation without LOB, minimal change in gait pattern  Stairs            Wheelchair Mobility    Modified Rankin (Stroke Patients Only)       Balance Overall balance assessment: Needs assistance Sitting-balance support: No upper extremity supported;Feet supported Sitting balance-Leahy Scale: Good     Standing balance support: No upper extremity supported Standing balance-Leahy Scale: Good                               Pertinent Vitals/Pain Pain Assessment: No/denies pain    Home Living Family/patient expects to be discharged to:: Private residence Living Arrangements: Alone Available Help at Discharge: Neighbor;Friend(s) Type of Home: House Home Access: Stairs to enter Entrance Stairs-Rails: Left Entrance Stairs-Number of Steps: 3 STE Home Layout: One level Home Equipment: Walker - 2 wheels;Cane - single point;Toilet riser      Prior Function Level of Independence: Independent         Comments: Indep without assist device for basic transfers and gait; denies fall history.  Has access to [facility] and RW if needed, but does "not want to rely on that".  Home O2 at 3L     Hand Dominance        Extremity/Trunk Assessment   Upper Extremity Assessment Upper Extremity Assessment: Overall WFL for tasks assessed    Lower Extremity Assessment Lower Extremity Assessment: Overall WFL for tasks assessed(grossly at least 4/5 throughout)       Communication   Communication: HOH(extremely HOH)  Cognition Arousal/Alertness: Awake/alert Behavior During Therapy: WFL for tasks assessed/performed Overall Cognitive Status: Within Functional  Limits for tasks assessed                                        General Comments      Exercises     Assessment/Plan    PT Assessment Patent does not need any further PT services  PT Problem List         PT Treatment Interventions DME instruction;Gait training;Functional  mobility training;Therapeutic activities;Therapeutic exercise;Balance training;Patient/family education    PT Goals (Current goals can be found in the Care Plan section)  Acute Rehab PT Goals Patient Stated Goal: to return home PT Goal Formulation: All assessment and education complete, DC therapy Time For Goal Achievement: 08/25/18 Potential to Achieve Goals: Good    Frequency     Barriers to discharge        Co-evaluation               AM-PAC PT "6 Clicks" Daily Activity  Outcome Measure Difficulty turning over in bed (including adjusting bedclothes, sheets and blankets)?: None Difficulty moving from lying on back to sitting on the side of the bed? : None Difficulty sitting down on and standing up from a chair with arms (e.g., wheelchair, bedside commode, etc,.)?: None Help needed moving to and from a bed to chair (including a wheelchair)?: None Help needed walking in hospital room?: None Help needed climbing 3-5 steps with a railing? : A Little 6 Click Score: 23    End of Session Equipment Utilized During Treatment: Gait belt Activity Tolerance: Patient tolerated treatment well Patient left: in chair;with call bell/phone within reach;with chair alarm set Nurse Communication: Mobility status PT Visit Diagnosis: Difficulty in walking, not elsewhere classified (R26.2);Muscle weakness (generalized) (M62.81)    Time: 2947-6546 PT Time Calculation (min) (ACUTE ONLY): 17 min   Charges:   PT Evaluation $PT Eval Low Complexity: 1 Low          Dimitri Dsouza H. Owens Shark, PT, DPT, NCS 08/25/18, 11:58 AM [phone]

## 2018-08-25 NOTE — Care Management Note (Signed)
Case Management Note  Patient Details  Name: Brett [name] MRN: [id number] Date of Birth: 1935-02-13  Subjective/Objective:  Met with patient and his Karleen Hampshire at bedside to discuss discharge planning. Patient discharging today and will need a home health RN. He lives alone and cares for himself. [name] assists him with his medications and travel to appointments. Patient is extremely HOH. [name] states they recently used [facility] and prefer to use them again. Referral to [facility] with [facility]. No DME needed. PT recommending no follow up.  Notified primary nurse patient was ready for discharge.                     Action/Plan:   Expected Discharge Date:  08/25/18               Expected Discharge Plan:  [place]  In-House Referral:     Discharge planning Services  CM Consult  Post Acute Care Choice:  Home Health Choice offered to:  [facility] POA / Guardian  DME Arranged:    DME Agency:     HH Arranged:  RN [place] Agency:  [place]  Status of Service:  Completed, signed off  If discussed at [place] of Stay Meetings, dates discussed:    Additional Comments:  Jolly Mango, RN 08/25/2018, 12:24 PM

## 2018-08-25 NOTE — Discharge Planning (Signed)
Patient IV removed. RN assessment and IV removed. Discharge papers given, explained and educated.  Informed of suggested FU with PCP and appt has been set by patient.  Printed/signed scripts given for glucometer and supplies and amaryl 2mg . HH also set up to assist with medical needs at home. Once ready, will be wheeled to front and friend transporting home via car.

## 2018-08-25 NOTE — Progress Notes (Addendum)
Inpatient Diabetes Program Recommendations  AACE/ADA: New Consensus Statement on Inpatient Glycemic Control (2019)  Target Ranges:  Prepandial:   less than 140 mg/dL      Peak postprandial:   less than 180 mg/dL (1-2 hours)      Critically ill patients:  140 - 180 mg/dL   Results for Brett [name], Brett [name] (MRN [id number]) as of 08/25/2018 07:46  Ref. Range 08/25/2018 04:48  Glucose Latest Ref Range: 70 - 99 mg/dL 255 (H)   Results for Brett [name], Brett [name] (MRN [id number]) as of 08/25/2018 07:46  Ref. Range 08/24/2018 05:33 08/24/2018 07:48 08/24/2018 11:39 08/24/2018 17:39 08/24/2018 21:13  Glucose-Capillary Latest Ref Range: 70 - 99 mg/dL 329 (H) 269 (H) 234 (H) 280 (H) 238 (H)   Results for Brett [name], Brett [name] (MRN [id number]) as of 08/25/2018 07:46  Ref. Range 05/30/2018 03:51 08/24/2018 05:07  Hemoglobin A1C Latest Ref Range: 4.8 - 5.6 % 7.2 (H) 11.9 (H)   Review of Glycemic Control  Diabetes history: DM2 Outpatient Diabetes medications: Amaryl 1 mg daily (prescribed BID) Current orders for Inpatient glycemic control: Novolog 0-15 units TID with meals, Novolog 0-5 units QHS, Amaryl 1 mg BID  Inpatient Diabetes Program Recommendations:  Oral Agents: Please consider increasing Amaryl to 2 mg BID. If glucose does not improve with increasing Amaryl, may need insulin outpatient (perhaps once a day basal insulin). HgbA1C: A1C 11.9% on 08/24/18 indicating an average glucose of 295 mg/dl over the past 2-3 months. In talking with patient's caregiver [name] on 08/24/18, it appears patient has been taking Amaryl 1 mg once a day (prescribed as BID) and has been drinking excessive amounts of juice and soda over the past week. Patient will need to follow up with PCP regarding DM control.  NOTE: Talked with Dr. Verdell Carmine regarding discharge plan for DM control. Due to poor vision there is a concern for safety with patient using insulin at home. Plan will be to discharge patient on Amaryl 2 mg BID and have patient follow  up with PCP early next week. Talked with [name] patient's caregiver over the phone. Explained plan for discharge DM medication changes to increase Amaryl to 2 mg BID, check glucose BID and keep a record of readings, and call PCP to make follow up appointment for Monday or Tuesday next week. Discussed A1C result 11.9% on 08/24/18 and explained average glucose 295 mg/dl over the past 2-3 weeks. Reviewed glucose and A1C goals again and reminded patient that once he gets the Living Well with DM book to read over entire book to increase his knowledge about DM. [name] requested that he be called once patient is ready to be discharged and he will come pick him up. Talked with Rodman Key, RN over the phone regarding discharge plan for DM control and to ask that [name] be called once everything is ready for discharge and patient is ready to go home.     Thanks, Barnie Alderman, RN, MSN, CDE Diabetes Coordinator Inpatient Diabetes Program [phone] (Team Pager from 8am to 5pm)

## 2018-08-25 NOTE — Discharge Summary (Addendum)
Brett [name] at [place] NAME: Brett [name]    MR#:  [id number]  DATE OF BIRTH:  07/08/1935  DATE OF ADMISSION:  08/23/2018 ADMITTING PHYSICIAN: Amelia Jo, MD  DATE OF DISCHARGE: 08/25/2018 12:31 PM  PRIMARY CARE PHYSICIAN: Perrin Maltese, MD    ADMISSION DIAGNOSIS:  Hyperglycemia [R73.9]  DISCHARGE DIAGNOSIS:  Active Problems:   Type 2 diabetes mellitus with hyperosmolar nonketotic hyperglycemia ([place])   SECONDARY DIAGNOSIS:   Past Medical History:  Diagnosis Date  . Anemia   . Aortic regurgitation   . B-cell lymphoma ([place]) 2009   DX AT [facility]  . Bronchitis   . CAD (coronary artery disease)   . Carotid stenosis   . Diabetes mellitus without complication ([place])   . Diabetes mellitus, type 2 ([place])   . Emphysema of lung ([place])   . Essential hypertension   . History of chemotherapy   . Hyperlipidemia   . Hypertension   . IDA (iron deficiency anemia)   . Leg edema   . Meralgia paraesthetica   . Mitral regurgitation   . PUD (peptic ulcer disease)   . PVD (peripheral vascular disease) ([place])   . Tobacco abuse     HOSPITAL COURSE:   83 year old male with past medical history of COPD, hypertension, diabetes, hyperlipidemia, iron deficiency anemia, peripheral vascular disease, history of peptic ulcer disease who presented to the hospital due to generalized weakness tremors of his upper extremities and noted to have severe hyperglycemia.  1.  Hyperglycemia/uncontrolled diabetes- patient was recently on a prednisone taper for COPD flare and has been drinking a lot of juices and sodas which led to his severe hyperglycemia. -Patient was admitted to the hospital, given aggressive IV fluids, given Lantus, sliding scale insulin.  Blood sugars have significantly improved.  He had no evidence of DKA. -Diabetes coordinator consult was obtained.  Patient's Amaryl dose has been increased from 1 mg to 2 mg twice daily, and he will have a home  health nurse follow-up his sugars. - Patient's diabetes is significantly uncontrolled as his A1c was as high as 11.  He was not placed on insulin as he is blind and would have a difficult time giving himself insulin.  We will try to control his blood sugars with oral meds for now. - Patient was also given a prescription for a glucometer it and to have his blood sugars checked twice a day and to follow-up with his primary care physician.  2.  Acute on chronic kidney injury- has underlying CKD stage III with baseline creatinine around 1.6-1.7.  The acute injury was secondary to severe hyperglycemia and dehydration. -This has improved with IV fluid hydration and is close to baseline and can be further followed as outpatient.   3.  Hyponatremia-this was pseudohyponatremia secondary to the severe hyperglycemia.  This has improved and resolved as his blood sugars have improved.  4.  COPD-no acute exacerbation - he will Continue Dulera, Spiriva. - he will cont. His chronic Home o2 at 2-3 L.   5.  Essential hypertension- he will continue Toprol, Imdur, Norvasc.  6.  Diabetic neuropathy- he will continue gabapentin.  7.  GERD- he will continue Protonix.  8.  Hyperlipidemia- he will continue Crestor.  DISCHARGE CONDITIONS:   Stable  CONSULTS OBTAINED:    DRUG ALLERGIES:  No Known Allergies  DISCHARGE MEDICATIONS:   Allergies as of 08/25/2018   No Known Allergies     Medication List    STOP  taking these medications   predniSONE 10 MG tablet Commonly known as:  DELTASONE     TAKE these medications   albuterol 108 (90 Base) MCG/ACT inhaler Commonly known as:  PROVENTIL HFA;VENTOLIN HFA Inhale 2 puffs into the lungs every 6 (six) hours as needed for wheezing or shortness of breath.   amLODipine 5 MG tablet Commonly known as:  NORVASC Take 5 mg by mouth daily.   blood glucose meter kit and supplies Kit Dispense based on patient and insurance preference. Use up to four  times daily as directed. (FOR ICD-9 250.00, 250.01).   clopidogrel 75 MG tablet Commonly known as:  PLAVIX Take 75 mg by mouth daily.   fluticasone 50 MCG/ACT nasal spray Commonly known as:  FLONASE Place 2 sprays into the nose daily as needed for allergies.   furosemide 40 MG tablet Commonly known as:  LASIX Take 0.5 tablets (20 mg total) by mouth daily.   gabapentin 100 MG capsule Commonly known as:  NEURONTIN Take 1 capsule by mouth 2 (two) times daily.   glimepiride 2 MG tablet Commonly known as:  AMARYL Take 1 tablet (2 mg total) by mouth 2 (two) times daily. What changed:    medication strength  how much to take   hydrALAZINE 100 MG tablet Commonly known as:  APRESOLINE Take 100 mg by mouth 3 (three) times daily.   isosorbide mononitrate 30 MG 24 hr tablet Commonly known as:  IMDUR Take 30 mg by mouth daily.   metoprolol 200 MG 24 hr tablet Commonly known as:  TOPROL-XL Take 1 tablet (200 mg total) by mouth daily.   mometasone-formoterol 100-5 MCG/ACT Aero Commonly known as:  DULERA Inhale 2 puffs into the lungs 2 (two) times daily.   pantoprazole 40 MG tablet Commonly known as:  PROTONIX Take 40 mg by mouth daily.   rosuvastatin 40 MG tablet Commonly known as:  CRESTOR Take 40 mg by mouth daily.   tiotropium 18 MCG inhalation capsule Commonly known as:  SPIRIVA Place 1 capsule (18 mcg total) into inhaler and inhale daily.         DISCHARGE INSTRUCTIONS:   DIET:  Cardiac diet and Diabetic diet  DISCHARGE CONDITION:  Stable  ACTIVITY:  Activity as tolerated  OXYGEN:  Home Oxygen: Yes.     Oxygen Delivery: 2 liters/min via Patient connected to nasal cannula oxygen  DISCHARGE LOCATION:  Home with Home Health nursing/PT   If you experience worsening of your admission symptoms, develop shortness of breath, life threatening emergency, suicidal or homicidal thoughts you must seek medical attention immediately by calling 911 or calling your  MD immediately  if symptoms less severe.  You Must read complete instructions/literature along with all the possible adverse reactions/side effects for all the Medicines you take and that have been prescribed to you. Take any new Medicines after you have completely understood and accpet all the possible adverse reactions/side effects.   Please note  You were cared for by a hospitalist during your hospital stay. If you have any questions about your discharge medications or the care you received while you were in the hospital after you are discharged, you can call the unit and asked to speak with the hospitalist on call if the hospitalist that took care of you is not available. Once you are discharged, your primary care physician will handle any further medical issues. Please note that NO REFILLS for any discharge medications will be authorized once you are discharged, as it is imperative that  you return to your primary care physician (or establish a relationship with a primary care physician if you do not have one) for your aftercare needs so that they can reassess your need for medications and monitor your lab values.     Today   BS have improved.  No acute complaints or events overnight. Will d/c home with Home Health today.   VITAL SIGNS:  Blood pressure (!) 190/86, pulse 99, temperature 97.9 F (36.6 C), temperature source Oral, resp. rate 18, height 5' 10"  (1.778 m), weight 88.5 kg, SpO2 94 %.  I/O:    Intake/Output Summary (Last 24 hours) at 08/25/2018 1641 Last data filed at 08/25/2018 1030 Gross per 24 hour  Intake 1511.15 ml  Output 1000 ml  Net 511.15 ml    PHYSICAL EXAMINATION:   GENERAL:  83 y.o.-year-old patient lying in bed very hard of hearing in NAD.  EYES: Pupils equal, round, reactive to light and accommodation. No scleral icterus. Extraocular muscles intact.  HEENT: Head atraumatic, normocephalic. Oropharynx and nasopharynx clear.  NECK:  Supple, no jugular venous  distention. No thyroid enlargement, no tenderness.  LUNGS: Normal breath sounds bilaterally, no wheezing, rales, rhonchi. No use of accessory muscles of respiration.  CARDIOVASCULAR: S1, S2 normal. No murmurs, rubs, or gallops.  ABDOMEN: Soft, nontender, nondistended. Bowel sounds present. No organomegaly or mass.  EXTREMITIES: No cyanosis, clubbing or edema b/l.    NEUROLOGIC: Cranial nerves II through XII are intact. No focal Motor or sensory deficits b/l.  PSYCHIATRIC: The patient is alert and oriented x 3.  SKIN: No obvious rash, lesion, or ulcer.    DATA REVIEW:   CBC Recent Labs  Lab 08/24/18 0507  WBC 6.0  HGB 9.9*  HCT 28.8*  PLT 156    Chemistries  Recent Labs  Lab 08/25/18 0448  NA 139  K 4.1  CL 101  CO2 29  GLUCOSE 255*  BUN 35*  CREATININE 1.93*  CALCIUM 8.5*    Cardiac Enzymes No results for input(s): TROPONINI in the last 168 hours.  Microbiology Results  Results for orders placed or performed during the hospital encounter of 12/30/17  Blood culture (routine x 2)     Status: None   Collection Time: 12/30/17  3:20 PM  Result Value Ref Range Status   Specimen Description BLOOD RIGHT ANTECUBITAL  Final   Special Requests   Final    BOTTLES DRAWN AEROBIC AND ANAEROBIC Blood Culture adequate volume   Culture   Final    NO GROWTH 5 DAYS Performed at [facility], [address], [place], [place] [address]    Report Status 01/04/2018 FINAL  Final  Blood culture (routine x 2)     Status: None   Collection Time: 12/30/17  4:38 PM  Result Value Ref Range Status   Specimen Description BLOOD BLOOD LEFT HAND  Final   Special Requests   Final    BOTTLES DRAWN AEROBIC AND ANAEROBIC Blood Culture adequate volume   Culture   Final    NO GROWTH 5 DAYS Performed at [facility], [address], [place],  [address]    Report Status 01/04/2018 FINAL  Final  MRSA PCR Screening     Status: None   Collection Time: 12/31/17  7:59 AM   Result Value Ref Range Status   MRSA by PCR NEGATIVE NEGATIVE Final    Comment:        The GeneXpert MRSA Assay (FDA approved for NASAL specimens only), is one  component of a comprehensive MRSA colonization surveillance program. It is not intended to diagnose MRSA infection nor to guide or monitor treatment for MRSA infections. Performed at [facility], [address], [place],  [address]     RADIOLOGY:  No results found.    Management plans discussed with the patient, family and they are in agreement.  CODE STATUS:  Code Status History    Date Active Date Inactive Code Status Order ID Comments User Context   08/24/2018 0149 08/25/2018 1536 DNR [id number]  Amelia Jo, MD Inpatient    TOTAL TIME TAKING CARE OF THIS PATIENT: 40 minutes.    Henreitta Leber M.D on 08/25/2018 at 4:41 PM  Between 7am to 6pm - Pager - [phone]  After 6pm go to [email or web address] - Proofreader  [facility]  Office  [phone]  CC: Primary care physician; Perrin Maltese, MD

## 2019-01-21 ENCOUNTER — Other Ambulatory Visit: Payer: Self-pay

## 2019-01-21 ENCOUNTER — Emergency Department: Payer: Medicare PPO

## 2019-01-21 ENCOUNTER — Inpatient Hospital Stay
Admission: EM | Admit: 2019-01-21 | Discharge: 2019-01-23 | DRG: 871 | Disposition: A | Discharge status: Home / Self Care | Payer: Medicare PPO | Attending: Internal Medicine | Admitting: Internal Medicine

## 2019-01-21 DIAGNOSIS — Z8673 Personal history of transient ischemic attack (TIA), and cerebral infarction without residual deficits: Secondary | ICD-10-CM | POA: Diagnosis not present

## 2019-01-21 DIAGNOSIS — I248 Other forms of acute ischemic heart disease: Secondary | ICD-10-CM | POA: Diagnosis present

## 2019-01-21 DIAGNOSIS — Z8249 Family history of ischemic heart disease and other diseases of the circulatory system: Secondary | ICD-10-CM

## 2019-01-21 DIAGNOSIS — Z7902 Long term (current) use of antithrombotics/antiplatelets: Secondary | ICD-10-CM | POA: Diagnosis not present

## 2019-01-21 DIAGNOSIS — Z8572 Personal history of non-Hodgkin lymphomas: Secondary | ICD-10-CM

## 2019-01-21 DIAGNOSIS — Z66 Do not resuscitate: Secondary | ICD-10-CM | POA: Diagnosis present

## 2019-01-21 DIAGNOSIS — Z9221 Personal history of antineoplastic chemotherapy: Secondary | ICD-10-CM

## 2019-01-21 DIAGNOSIS — J9611 Chronic respiratory failure with hypoxia: Secondary | ICD-10-CM | POA: Diagnosis present

## 2019-01-21 DIAGNOSIS — E1165 Type 2 diabetes mellitus with hyperglycemia: Secondary | ICD-10-CM | POA: Diagnosis present

## 2019-01-21 DIAGNOSIS — Z8711 Personal history of peptic ulcer disease: Secondary | ICD-10-CM

## 2019-01-21 DIAGNOSIS — A419 Sepsis, unspecified organism: Secondary | ICD-10-CM | POA: Diagnosis present

## 2019-01-21 DIAGNOSIS — J440 Chronic obstructive pulmonary disease with acute lower respiratory infection: Secondary | ICD-10-CM | POA: Diagnosis present

## 2019-01-21 DIAGNOSIS — Z9981 Dependence on supplemental oxygen: Secondary | ICD-10-CM

## 2019-01-21 DIAGNOSIS — E1151 Type 2 diabetes mellitus with diabetic peripheral angiopathy without gangrene: Secondary | ICD-10-CM | POA: Diagnosis present

## 2019-01-21 DIAGNOSIS — I2510 Atherosclerotic heart disease of native coronary artery without angina pectoris: Secondary | ICD-10-CM | POA: Diagnosis present

## 2019-01-21 DIAGNOSIS — Z79899 Other long term (current) drug therapy: Secondary | ICD-10-CM | POA: Diagnosis not present

## 2019-01-21 DIAGNOSIS — J189 Pneumonia, unspecified organism: Secondary | ICD-10-CM | POA: Diagnosis present

## 2019-01-21 DIAGNOSIS — D509 Iron deficiency anemia, unspecified: Secondary | ICD-10-CM | POA: Diagnosis present

## 2019-01-21 DIAGNOSIS — Z87891 Personal history of nicotine dependence: Secondary | ICD-10-CM

## 2019-01-21 DIAGNOSIS — E785 Hyperlipidemia, unspecified: Secondary | ICD-10-CM | POA: Diagnosis present

## 2019-01-21 DIAGNOSIS — K219 Gastro-esophageal reflux disease without esophagitis: Secondary | ICD-10-CM | POA: Diagnosis present

## 2019-01-21 DIAGNOSIS — I080 Rheumatic disorders of both mitral and aortic valves: Secondary | ICD-10-CM | POA: Diagnosis present

## 2019-01-21 DIAGNOSIS — E1140 Type 2 diabetes mellitus with diabetic neuropathy, unspecified: Secondary | ICD-10-CM | POA: Diagnosis present

## 2019-01-21 DIAGNOSIS — I10 Essential (primary) hypertension: Secondary | ICD-10-CM | POA: Diagnosis present

## 2019-01-21 DIAGNOSIS — H9190 Unspecified hearing loss, unspecified ear: Secondary | ICD-10-CM | POA: Diagnosis present

## 2019-01-21 DIAGNOSIS — Z7951 Long term (current) use of inhaled steroids: Secondary | ICD-10-CM | POA: Diagnosis not present

## 2019-01-21 DIAGNOSIS — R911 Solitary pulmonary nodule: Secondary | ICD-10-CM | POA: Diagnosis present

## 2019-01-21 LAB — CBC WITH DIFFERENTIAL/PLATELET
Abs Immature Granulocytes: 0.04 10*3/uL (ref 0.00–0.07)
BASOS ABS: 0 10*3/uL (ref 0.0–0.1)
Basophils Relative: 0 %
Eosinophils Absolute: 0.1 10*3/uL (ref 0.0–0.5)
Eosinophils Relative: 0 %
HEMATOCRIT: 33.3 % — AB (ref 39.0–52.0)
Hemoglobin: 10 g/dL — ABNORMAL LOW (ref 13.0–17.0)
Immature Granulocytes: 0 %
Lymphocytes Relative: 5 %
Lymphs Abs: 0.7 10*3/uL (ref 0.7–4.0)
MCH: 26.5 pg (ref 26.0–34.0)
MCHC: 30 g/dL (ref 30.0–36.0)
MCV: 88.1 fL (ref 80.0–100.0)
Monocytes Absolute: 1.3 10*3/uL — ABNORMAL HIGH (ref 0.1–1.0)
Monocytes Relative: 10 %
Neutro Abs: 10.5 10*3/uL — ABNORMAL HIGH (ref 1.7–7.7)
Neutrophils Relative %: 85 %
Platelets: 198 10*3/uL (ref 150–400)
RBC: 3.78 MIL/uL — ABNORMAL LOW (ref 4.22–5.81)
RDW: 14.6 % (ref 11.5–15.5)
WBC: 12.6 10*3/uL — ABNORMAL HIGH (ref 4.0–10.5)
nRBC: 0 % (ref 0.0–0.2)

## 2019-01-21 LAB — COMPREHENSIVE METABOLIC PANEL
ALK PHOS: 66 U/L (ref 38–126)
ALT: 14 U/L (ref 0–44)
AST: 17 U/L (ref 15–41)
Albumin: 3.8 g/dL (ref 3.5–5.0)
Anion gap: 9 (ref 5–15)
BILIRUBIN TOTAL: 0.9 mg/dL (ref 0.3–1.2)
BUN: 20 mg/dL (ref 8–23)
CO2: 30 mmol/L (ref 22–32)
Calcium: 8.9 mg/dL (ref 8.9–10.3)
Chloride: 102 mmol/L (ref 98–111)
Creatinine, Ser: 1.91 mg/dL — ABNORMAL HIGH (ref 0.61–1.24)
GFR calc Af Amer: 37 mL/min — ABNORMAL LOW (ref >60)
GFR calc non Af Amer: 32 mL/min — ABNORMAL LOW (ref >60)
Glucose, Bld: 193 mg/dL — ABNORMAL HIGH (ref 70–99)
POTASSIUM: 4.2 mmol/L (ref 3.5–5.1)
Sodium: 141 mmol/L (ref 135–145)
TOTAL PROTEIN: 7.5 g/dL (ref 6.5–8.1)

## 2019-01-21 LAB — INFLUENZA PANEL BY PCR (TYPE A & B)
Influenza A By PCR: NEGATIVE
Influenza B By PCR: NEGATIVE

## 2019-01-21 LAB — URINALYSIS, ROUTINE W REFLEX MICROSCOPIC
Bilirubin Urine: NEGATIVE
Glucose, UA: 50 mg/dL — AB
Ketones, ur: 5 mg/dL — AB
Leukocytes,Ua: NEGATIVE
Nitrite: NEGATIVE
Protein, ur: 100 mg/dL — AB
Specific Gravity, Urine: >1.046 — ABNORMAL HIGH (ref 1.005–1.030)
pH: 6 (ref 5.0–8.0)

## 2019-01-21 LAB — BLOOD GAS, VENOUS
Acid-Base Excess: 3.4 mmol/L — ABNORMAL HIGH (ref 0.0–2.0)
Bicarbonate: 29.6 mmol/L — ABNORMAL HIGH (ref 20.0–28.0)
O2 Saturation: 60.1 %
Patient temperature: 37
pCO2, Ven: 50 mmHg (ref 44.0–60.0)
pH, Ven: 7.38 (ref 7.250–7.430)
pO2, Ven: 32 mmHg (ref 32.0–45.0)

## 2019-01-21 LAB — LIPASE, BLOOD: Lipase: 28 U/L (ref 11–51)

## 2019-01-21 LAB — GLUCOSE, CAPILLARY: Glucose-Capillary: 333 mg/dL — ABNORMAL HIGH (ref 70–99)

## 2019-01-21 LAB — TROPONIN I
Troponin I: 0.19 ng/mL (ref <0.03)
Troponin I: 0.09 ng/mL (ref <0.03)
Troponin I: 0.07 ng/mL (ref <0.03)

## 2019-01-21 LAB — LACTIC ACID, PLASMA: Lactic Acid, Venous: 1.4 mmol/L (ref 0.5–1.9)

## 2019-01-21 LAB — PROTIME-INR
INR: 1
Prothrombin Time: 13.1 seconds (ref 11.4–15.2)

## 2019-01-21 MED ORDER — ASPIRIN 81 MG PO CHEW
324.0000 mg | CHEWABLE_TABLET | Freq: Once | ORAL | Status: AC
  Administered 2019-01-21: 324 mg via ORAL
  Filled 2019-01-21: qty 4

## 2019-01-21 MED ORDER — ACETAMINOPHEN 325 MG PO TABS: 650.0000 mg | ORAL_TABLET | Freq: Four times a day (QID) | ORAL | Status: DC | PRN

## 2019-01-21 MED ORDER — SODIUM CHLORIDE 0.9 % IV SOLN: 1.0000 g | INTRAVENOUS | Status: DC

## 2019-01-21 MED ORDER — ISOSORBIDE MONONITRATE ER 30 MG PO TB24
30.0000 mg | ORAL_TABLET | Freq: Every day | ORAL | Status: DC
  Administered 2019-01-21 – 2019-01-23 (×3): 30 mg via ORAL
  Filled 2019-01-21 (×3): qty 1

## 2019-01-21 MED ORDER — IOHEXOL 350 MG/ML SOLN
75.0000 mL | Freq: Once | INTRAVENOUS | Status: AC | PRN
  Administered 2019-01-21: 75 mL via INTRAVENOUS

## 2019-01-21 MED ORDER — AZITHROMYCIN 250 MG PO TABS: 250.0000 mg | ORAL_TABLET | Freq: Every day | ORAL | Status: DC

## 2019-01-21 MED ORDER — GLIMEPIRIDE 2 MG PO TABS
2.0000 mg | ORAL_TABLET | Freq: Two times a day (BID) | ORAL | Status: DC
  Administered 2019-01-21 – 2019-01-23 (×4): 2 mg via ORAL
  Filled 2019-01-21 (×5): qty 1

## 2019-01-21 MED ORDER — SODIUM CHLORIDE 0.9 % IV SOLN
500.0000 mg | INTRAVENOUS | Status: DC
  Administered 2019-01-21 – 2019-01-22 (×2): 500 mg via INTRAVENOUS
  Filled 2019-01-21 (×3): qty 500

## 2019-01-21 MED ORDER — METOPROLOL SUCCINATE ER 100 MG PO TB24
200.0000 mg | ORAL_TABLET | Freq: Every day | ORAL | Status: DC
  Administered 2019-01-21 – 2019-01-23 (×3): 200 mg via ORAL
  Filled 2019-01-21 (×3): qty 2

## 2019-01-21 MED ORDER — LEVALBUTEROL HCL 1.25 MG/0.5ML IN NEBU
2.5000 mg | INHALATION_SOLUTION | Freq: Once | RESPIRATORY_TRACT | Status: AC
  Administered 2019-01-21: 2.5 mg via RESPIRATORY_TRACT
  Filled 2019-01-21: qty 1

## 2019-01-21 MED ORDER — IPRATROPIUM-ALBUTEROL 0.5-2.5 (3) MG/3ML IN SOLN: 3.0000 mL | Freq: Four times a day (QID) | RESPIRATORY_TRACT | Status: DC | PRN

## 2019-01-21 MED ORDER — AMLODIPINE BESYLATE 5 MG PO TABS
5.0000 mg | ORAL_TABLET | Freq: Every day | ORAL | Status: DC
  Administered 2019-01-21 – 2019-01-23 (×3): 5 mg via ORAL
  Filled 2019-01-21 (×3): qty 1

## 2019-01-21 MED ORDER — ONDANSETRON HCL 4 MG PO TABS: 4.0000 mg | ORAL_TABLET | Freq: Four times a day (QID) | ORAL | Status: DC | PRN

## 2019-01-21 MED ORDER — METHYLPREDNISOLONE SODIUM SUCC 125 MG IJ SOLR
80.0000 mg | INTRAMUSCULAR | Status: AC
  Administered 2019-01-21: 80 mg via INTRAVENOUS
  Filled 2019-01-21: qty 2

## 2019-01-21 MED ORDER — GABAPENTIN 100 MG PO CAPS
100.0000 mg | ORAL_CAPSULE | Freq: Two times a day (BID) | ORAL | Status: DC
  Administered 2019-01-21 – 2019-01-23 (×4): 100 mg via ORAL
  Filled 2019-01-21 (×4): qty 1

## 2019-01-21 MED ORDER — ACETAMINOPHEN 650 MG RE SUPP: 650.0000 mg | Freq: Four times a day (QID) | RECTAL | Status: DC | PRN

## 2019-01-21 MED ORDER — ALBUTEROL SULFATE (2.5 MG/3ML) 0.083% IN NEBU: 2.5000 mg | INHALATION_SOLUTION | Freq: Four times a day (QID) | RESPIRATORY_TRACT | Status: DC | PRN

## 2019-01-21 MED ORDER — SODIUM CHLORIDE 0.9 % IV BOLUS
500.0000 mL | Freq: Once | INTRAVENOUS | Status: AC
  Administered 2019-01-21: 500 mL via INTRAVENOUS

## 2019-01-21 MED ORDER — CLOPIDOGREL BISULFATE 75 MG PO TABS
75.0000 mg | ORAL_TABLET | Freq: Every day | ORAL | Status: DC
  Administered 2019-01-21 – 2019-01-23 (×3): 75 mg via ORAL
  Filled 2019-01-21 (×3): qty 1

## 2019-01-21 MED ORDER — ENOXAPARIN SODIUM 40 MG/0.4ML ~~LOC~~ SOLN
40.0000 mg | SUBCUTANEOUS | Status: DC
  Administered 2019-01-21 – 2019-01-22 (×2): 40 mg via SUBCUTANEOUS
  Filled 2019-01-21 (×2): qty 0.4

## 2019-01-21 MED ORDER — TIOTROPIUM BROMIDE MONOHYDRATE 18 MCG IN CAPS
18.0000 ug | ORAL_CAPSULE | Freq: Every day | RESPIRATORY_TRACT | Status: DC
  Administered 2019-01-21 – 2019-01-23 (×3): 18 ug via RESPIRATORY_TRACT
  Filled 2019-01-21: qty 5

## 2019-01-21 MED ORDER — MOMETASONE FURO-FORMOTEROL FUM 100-5 MCG/ACT IN AERO
2.0000 | INHALATION_SPRAY | Freq: Two times a day (BID) | RESPIRATORY_TRACT | Status: DC
  Administered 2019-01-21 – 2019-01-23 (×4): 2 via RESPIRATORY_TRACT
  Filled 2019-01-21: qty 8.8

## 2019-01-21 MED ORDER — PANTOPRAZOLE SODIUM 40 MG PO TBEC
40.0000 mg | DELAYED_RELEASE_TABLET | Freq: Every day | ORAL | Status: DC
  Administered 2019-01-21 – 2019-01-23 (×3): 40 mg via ORAL
  Filled 2019-01-21 (×3): qty 1

## 2019-01-21 MED ORDER — HYDRALAZINE HCL 50 MG PO TABS
100.0000 mg | ORAL_TABLET | Freq: Three times a day (TID) | ORAL | Status: DC
  Administered 2019-01-21 – 2019-01-23 (×5): 100 mg via ORAL
  Filled 2019-01-21 (×5): qty 2

## 2019-01-21 MED ORDER — INSULIN ASPART 100 UNIT/ML ~~LOC~~ SOLN
0.0000 [IU] | Freq: Every day | SUBCUTANEOUS | Status: DC
  Administered 2019-01-21: 4 [IU] via SUBCUTANEOUS
  Administered 2019-01-23: 1 [IU] via SUBCUTANEOUS
  Filled 2019-01-21: qty 1

## 2019-01-21 MED ORDER — IPRATROPIUM BROMIDE 0.02 % IN SOLN
1.0000 mg | Freq: Once | RESPIRATORY_TRACT | Status: AC
  Administered 2019-01-21: 1 mg via RESPIRATORY_TRACT
  Filled 2019-01-21: qty 5

## 2019-01-21 MED ORDER — ONDANSETRON HCL 4 MG/2ML IJ SOLN: 4.0000 mg | Freq: Four times a day (QID) | INTRAMUSCULAR | Status: DC | PRN

## 2019-01-21 MED ORDER — SODIUM CHLORIDE 0.9 % IV SOLN
2.0000 g | INTRAVENOUS | Status: DC
  Administered 2019-01-21 – 2019-01-22 (×2): 2 g via INTRAVENOUS
  Filled 2019-01-21: qty 20
  Filled 2019-01-21: qty 2
  Filled 2019-01-21: qty 20

## 2019-01-21 MED ORDER — ROSUVASTATIN CALCIUM 20 MG PO TABS
40.0000 mg | ORAL_TABLET | Freq: Every day | ORAL | Status: DC
  Administered 2019-01-21 – 2019-01-23 (×3): 40 mg via ORAL
  Filled 2019-01-21 (×2): qty 2
  Filled 2019-01-21 (×3): qty 4
  Filled 2019-01-21: qty 2

## 2019-01-21 MED ORDER — INSULIN ASPART 100 UNIT/ML ~~LOC~~ SOLN
0.0000 [IU] | Freq: Three times a day (TID) | SUBCUTANEOUS | Status: DC
  Administered 2019-01-22: 3 [IU] via SUBCUTANEOUS
  Administered 2019-01-22: 2 [IU] via SUBCUTANEOUS
  Administered 2019-01-22: 3 [IU] via SUBCUTANEOUS
  Administered 2019-01-23 (×2): 1 [IU] via SUBCUTANEOUS
  Filled 2019-01-21 (×5): qty 1

## 2019-01-21 NOTE — ED Notes (Signed)
Pt is labored breathening and tolerating ned tx at this time. Pt states he only has pain in the right side chest  that is a 4/10 with cough

## 2019-01-21 NOTE — ED Notes (Signed)
Pt tolerated neb tx, breathe has improved, respirations are still labored  With a significant decrease.

## 2019-01-21 NOTE — ED Provider Notes (Signed)
[facility] Emergency Department Provider Note   ____________________________________________   First MD Initiated Contact with Patient 01/21/19 1151     (approximate)  I have reviewed the triage vital signs and the nursing notes.   HISTORY  Chief Complaint Respiratory Distress  EM caveat: The patient slightly confused, poor historian.  Able to tell us he is having shortness of breath  HPI Brett [name] is a 84 y.o. 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  He is currently continued to feel short of breath.  Denies being in any pain.  He is not able to give a full history though with assessing things, somewhat due to dyspnea but also seems slightly confused.  Definitely hard of hearing.  Past Medical History:  Diagnosis Date  . Anemia   . Aortic regurgitation   . B-cell lymphoma ([place]) 2009   DX AT [facility]  . Bronchitis   . CAD (coronary artery disease)   . Carotid stenosis   . Diabetes mellitus without complication ([place])   . Diabetes mellitus, type 2 ([place])   . Emphysema of lung ([place])   . Essential hypertension   . History of chemotherapy   . Hyperlipidemia   . Hypertension   . IDA (iron deficiency anemia)   . Leg edema   . Meralgia paraesthetica   . Mitral regurgitation   . PUD (peptic ulcer disease)   . PVD (peripheral vascular disease) ([place])   . Tobacco abuse     Patient Active Problem List   Diagnosis Date Noted  . Type 2 diabetes mellitus with hyperosmolar nonketotic hyperglycemia ([place]) 08/24/2018  . CHF exacerbation ([place]) 05/29/2018  . COPD exacerbation ([place]) 12/05/2017  . CAP (community acquired pneumonia) 11/13/2017  . Acute respiratory failure ([place]) 11/13/2017  . SIRS (systemic inflammatory response syndrome) ([place]) 11/13/2017  . Stroke ([place]) 12/02/2016  . Stroke (cerebrum) ([place]) 12/02/2016  . Leg weakness,  bilateral 09/03/2016  . Chronic diastolic heart failure ([place]) 08/06/2016  . Pleural effusion 08/05/2016  . Sepsis ([place]) 07/10/2016  . HCAP (healthcare-associated pneumonia) 07/10/2016  . HTN (hypertension) 07/10/2016  . Diabetes ([place]) 07/10/2016  . CAD (coronary artery disease) 07/10/2016  . COPD (chronic obstructive pulmonary disease) ([place]) 06/20/2016  . Chest pain 06/18/2016  . B-cell lymphoma ([place]) 10/14/2015    Past Surgical History:  Procedure Laterality Date  . CARDIAC CATHETERIZATION  08/15/2007  . CARDIAC CATHETERIZATION Right 07/13/2016   Procedure: Right/Left Heart Cath and Coronary Angiography;  Surgeon: Dionisio David, MD;  Location: [facility];  Service: Cardiovascular;  Laterality: Right;  . COLONOSCOPY  03/2013  . ESOPHAGOGASTRODUODENOSCOPY  03/2013    Prior to Admission medications   Medication Sig Start Date End Date Taking? Authorizing Provider  albuterol (PROVENTIL HFA;VENTOLIN HFA) 108 (90 Base) MCG/ACT inhaler Inhale 2 puffs into the lungs every 6 (six) hours as needed for wheezing or shortness of breath. 09/24/17   Loletha Grayer, MD  amLODipine (NORVASC) 5 MG tablet Take 5 mg by mouth daily.  08/27/15   [provider]  blood glucose meter kit and supplies KIT Dispense based on patient and insurance preference. Use up to four times daily as directed. (FOR ICD-9 250.00, 250.01). 08/25/18   Henreitta Leber, MD  clopidogrel (PLAVIX) 75 MG tablet Take 75 mg by mouth daily.  08/27/15   [provider]  fluticasone (FLONASE) 50 MCG/ACT nasal  spray Place 2 sprays into the nose daily as needed for allergies.    [provider]  furosemide (LASIX) 40 MG tablet Take 0.5 tablets (20 mg total) by mouth daily. 05/31/18 07/30/18  Henreitta Leber, MD  gabapentin (NEURONTIN) 100 MG capsule Take 1 capsule by mouth 2 (two) times daily.  09/22/15   [provider]  glimepiride (AMARYL) 2 MG tablet Take 1 tablet (2 mg total) by mouth 2 (two)  times daily. 08/25/18 09/24/18  Henreitta Leber, MD  hydrALAZINE (APRESOLINE) 100 MG tablet Take 100 mg by mouth 3 (three) times daily.    [provider]  isosorbide mononitrate (IMDUR) 30 MG 24 hr tablet Take 30 mg by mouth daily.    [provider]  metoprolol (TOPROL-XL) 200 MG 24 hr tablet Take 1 tablet (200 mg total) by mouth daily. 09/24/17   Loletha Grayer, MD  mometasone-formoterol (DULERA) 100-5 MCG/ACT AERO Inhale 2 puffs into the lungs 2 (two) times daily. 05/31/18   Henreitta Leber, MD  pantoprazole (PROTONIX) 40 MG tablet Take 40 mg by mouth daily.    [provider]  rosuvastatin (CRESTOR) 40 MG tablet Take 40 mg by mouth daily.    [provider]  tiotropium (SPIRIVA HANDIHALER) 18 MCG inhalation capsule Place 1 capsule (18 mcg total) into inhaler and inhale daily. 05/31/18   Henreitta Leber, MD    Allergies Patient has no known allergies.  Family History  Problem Relation Age of Onset  . CAD Mother   . Rheum arthritis Neg Hx   . Osteoarthritis Neg Hx   . Asthma Neg Hx   . Diabetes Neg Hx   . Cancer Neg Hx     Social History Social History   Tobacco Use  . Smoking status: Former Smoker    Types: Cigarettes    Last attempt to quit: 08/30/2015    Years since quitting: 3.3  . Smokeless tobacco: Never Used  Substance Use Topics  . Alcohol use: No    Frequency: Never  . Drug use: No    Review of Systems EM caveat   ____________________________________________   PHYSICAL EXAM:  VITAL SIGNS: ED Triage Vitals  Enc Vitals Group     BP 01/21/19 1152 (!) 155/77     Pulse Rate 01/21/19 1152 (!) 132     Resp --      Temp --      Temp src --      SpO2 01/21/19 1151 98 %     Weight 01/21/19 1154 195 lb 1.7 oz (88.5 kg)     Height --      Head Circumference --      Peak Flow --      Pain Score 01/21/19 1153 0     Pain Loc --      Pain Edu? --      Excl. in [place]? --     Constitutional: Alert and oriented to self and  place but not able to give a good history as to recent events, able to tell me he suddenly started feeling short of breath.  Appears tachypneic. Eyes: Conjunctivae are normal. Head: Atraumatic. Nose: No congestion/rhinnorhea. Mouth/Throat: Mucous membranes are dry. Neck: No stridor.  No JVD Cardiovascular: Formerly tachycardic, heart rate about 145 rate, regular rhythm. Grossly normal heart sounds.  Good peripheral circulation. Respiratory: Notable tachypnea.  Mild accessory muscle use.  Able to speak in sentences, but states short of breath unable to provide a good history.  Lungs appear to be clear, no wheezing is noted.  He is maintaining his airway well.  Does not appear to be fatigued. Gastrointestinal: Soft and nontender. No distention. Musculoskeletal: No lower extremity tenderness nor edema. Neurologic:  Normal speech and language. No gross focal neurologic deficits are appreciated.  Skin:  Skin is warm, dry and intact. No rash noted. Psychiatric: Mood and affect are normal. Speech and behavior are normal.  ____________________________________________   LABS (all labs ordered are listed, but only abnormal results are displayed)  Labs Reviewed  COMPREHENSIVE METABOLIC PANEL - Abnormal; Notable for the following components:      Result Value   Glucose, Bld 193 (*)    Creatinine, Ser 1.91 (*)    GFR calc non Af Amer 32 (*)    GFR calc Af Amer 37 (*)    All other components within normal limits  CBC WITH DIFFERENTIAL/PLATELET - Abnormal; Notable for the following components:   WBC 12.6 (*)    RBC 3.78 (*)    Hemoglobin 10.0 (*)    HCT 33.3 (*)    Neutro Abs 10.5 (*)    Monocytes Absolute 1.3 (*)    All other components within normal limits  BLOOD GAS, VENOUS - Abnormal; Notable for the following components:   Bicarbonate 29.6 (*)    Acid-Base Excess 3.4 (*)    All other components within normal limits  TROPONIN I - Abnormal; Notable for the following components:   Troponin  I 0.19 (*)    All other components within normal limits  CULTURE, BLOOD (ROUTINE X 2)  CULTURE, BLOOD (ROUTINE X 2)  LACTIC ACID, PLASMA  LIPASE, BLOOD  PROTIME-INR  INFLUENZA PANEL BY PCR (TYPE A & B)  URINALYSIS, ROUTINE W REFLEX MICROSCOPIC  CBG MONITORING, ED   ____________________________________________  EKG Reviewed by me at 1140 Heart rate 130 QRS 80 QTc 410 Sinus tachycardia, possible old anterior infarct, no evidence of acute STEMI or ischemia denoted. ____________________________________________  RADIOLOGY  Ct Head Wo Contrast  Result Date: 01/21/2019 CLINICAL DATA:  Altered mental status today.  Shortness of breath. EXAM: CT HEAD WITHOUT CONTRAST TECHNIQUE: Contiguous axial images were obtained from the base of the skull through the vertex without intravenous contrast. COMPARISON:  Head CT 12/01/2016.  Brain MRI 12/02/2016. FINDINGS: Brain: No evidence of acute infarction, hemorrhage, hydrocephalus, extra-axial collection or mass lesion/mass effect. Vascular: No hyperdense vessel or unexpected calcification. Skull: Intact.  No focal lesion. Sinuses/Orbits: Negative. Other: None. IMPRESSION: Negative head CT. Electronically Signed   By: Inge Rise M.D.   On: 01/21/2019 14:02   Ct Angio Chest Pe W And/or Wo Contrast  Result Date: 01/21/2019 CLINICAL DATA:  Shortness of breath. EXAM: CT ANGIOGRAPHY CHEST WITH CONTRAST TECHNIQUE: Multidetector CT imaging of the chest was performed using the standard protocol during bolus administration of intravenous contrast. Multiplanar CT image reconstructions and MIPs were obtained to evaluate the vascular anatomy. CONTRAST:  37m OMNIPAQUE IOHEXOL 350 MG/ML SOLN COMPARISON:  CT chest dated July 12, 2016. FINDINGS: Cardiovascular: Satisfactory opacification of the pulmonary arteries to the segmental level. No evidence of pulmonary embolism. Unchanged mild cardiomegaly. No pericardial effusion. No thoracic aortic aneurysm or  dissection. Coronary, aortic arch, and branch vessel atherosclerotic vascular disease. Mediastinum/Nodes: There are several prominent mediastinal lymph nodes measuring up to 1 cm in short axis, likely reactive. No axillary or hilar lymphadenopathy. The thyroid gland, trachea, and esophagus demonstrate no significant findings. Lungs/Pleura: Moderate centrilobular emphysema again noted. Diffuse peribronchial thickening. Patchy consolidation in the  posterior right upper lobe. Dependent subsegmental atelectasis in the right lower lobe. No pleural effusion or pneumothorax. New 5 mm nodule along the right minor fissure (series 6, image 45). Small area of centrilobular nodularity in the anterior right middle lobe. Pleuroparenchymal scarring in the lingula. Upper Abdomen: No acute abnormality. Musculoskeletal: No chest wall abnormality. No acute or significant osseous findings. Review of the MIP images confirms the above findings. IMPRESSION: 1.  No evidence of pulmonary embolism. 2. Patchy consolidation in the posterior right upper lobe, concerning for pneumonia. 3. Small area of centrilobular nodularity in the anterior right middle lobe likely represents infectious or inflammatory bronchiolitis. 4. New 5 mm nodule along the right minor fissure. No follow-up needed if patient is low-risk. Non-contrast chest CT can be considered in 12 months if patient is high-risk. This recommendation follows the consensus statement: Guidelines for Management of Incidental Pulmonary Nodules Detected on CT Images: From the Fleischner Society 2017; Radiology 2017; 284:228-243. 5.  Emphysema (ICD10-J43.9). 6.  Aortic atherosclerosis (ICD10-I70.0). Electronically Signed   By: Titus Dubin M.D.   On: 01/21/2019 14:15   Dg Chest Portable 1 View  Result Date: 01/21/2019 CLINICAL DATA:  Dyspnea. EXAM: PORTABLE CHEST 1 VIEW COMPARISON:  05/29/2018 FINDINGS: Mildly enlarged cardiac silhouette. Calcific atherosclerotic disease of the aorta.  There is no evidence of focal airspace consolidation, pleural effusion or pneumothorax. Osseous structures are without acute abnormality. Soft tissues are grossly normal. IMPRESSION: 1. Mildly enlarged cardiac silhouette. 2. Calcific atherosclerotic disease of the aorta. 3. No pulmonary edema or consolidation. Electronically Signed   By: Fidela Salisbury M.D.   On: 01/21/2019 12:50     Also note, pulmonary nodules noted. ____________________________________________   PROCEDURES  Procedure(s) performed: None  Procedures  Critical Care performed: Yes, see critical care note(s)  CRITICAL CARE Performed by: Delman Kitten   Total critical care time: 37 minutes  Critical care time was exclusive of separately billable procedures and treating other patients.  Critical care was necessary to treat or prevent imminent or life-threatening deterioration.  Critical care was time spent personally by me on the following activities: development of treatment plan with patient and/or surrogate as well as nursing, discussions with consultants, evaluation of patient's response to treatment, examination of patient, obtaining history from patient or surrogate, ordering and performing treatments and interventions, ordering and review of laboratory studies, ordering and review of radiographic studies, pulse oximetry and re-evaluation of patient's condition.  Patient presents with persistent tachycardia, dyspnea.  Noted to be hypoxic on room air, improved on 2 L nasal cannula.  Patient leukocytosis, tachycardia, CT evidence of pneumonia.  Denies recent hospitalizations or antibiotic use.  Appears to be community-acquired pneumonia meeting sepsis criteria.  Code sepsis initiated, IV fluids lactate normal and not hypotensive thus no large volume resuscitation given at this time and initiate azithromycin and Rocephin.  ____________________________________________   INITIAL IMPRESSION / ASSESSMENT AND PLAN / ED  COURSE  Pertinent labs & imaging results that were available during my care of the patient were reviewed by me and considered in my medical decision making (see chart for details).   Patient presents for shortness of breath.  History of COPD, treated with nebulizers here in steroid though not notably significantly wheezing but do suspect likely respiratory source though also metabolic acidosis could have similar presentation and also exclude PE.  Denies notable chest pain except for a little bit of discomfort the right side of the chest wall.  After review of CT imaging suspect this is likely  secondary to pneumonia, troponin minimally elevated without evidence of acute ischemia on EKG signaling likely demand ischemia for which we will give oral aspirin as well.  ----------------------------------------- 2:33 PM on 01/21/2019 -----------------------------------------  Patient updated on plan of care, discussed pneumonia, currently resting with improved work of breathing, saturation 92% on 2 L nasal cannula.  Able to utilize his cell phone and converse over it, appears improved.    Admission discussed with Dr. Verdell Carmine  ____________________________________________   FINAL CLINICAL IMPRESSION(S) / ED DIAGNOSES  Final diagnoses:  Community acquired pneumonia of right lung, unspecified part of lung  Sepsis, due to unspecified organism, unspecified whether acute organ dysfunction present [facility])  Pulmonary nodule        Note:  This document was prepared using Dragon voice recognition software and may include unintentional dictation errors       Delman Kitten, MD 01/21/19 1456

## 2019-01-21 NOTE — ED Notes (Signed)
Blood [place] x1 sent as difficult stick and unable to collect.

## 2019-01-21 NOTE — H&P (Signed)
[place] [facility] [place] NAME: Brett [name]    MR#:  [id number]  DATE OF BIRTH:  1935-01-05  DATE OF ADMISSION:  01/21/2019  PRIMARY CARE PHYSICIAN: Perrin Maltese, MD   REQUESTING/REFERRING PHYSICIAN: Dr. Delman Kitten  CHIEF COMPLAINT:   Chief Complaint  Patient presents with  . Respiratory Distress    HISTORY OF PRESENT ILLNESS:  Brett [name]  is a 84 y.o. male with a known history of diabetes, hypertension, hyperlipidemia, peptic ulcer disease, peripheral vascular disease, history of lymphoma, coronary artery disease, carotid stenosis who presents to the hospital due to shortness of breath, chest pain.  Patient says she has been having progressive shortness of breath now for the past few days and has had a cough which has been nonproductive.  He does say that he has been having pain on the right side of his chest when he coughs and when he tries to take a deep breath.  He presents to the ER is he had not been improving and underwent a CT of the chest which was suggestive of right-sided pneumonia and hospitalist services were contacted for admission.  Patient denies any nausea or vomiting abdominal pain, melena, hematochezia, diarrhea or any other associated symptoms presently.  PAST MEDICAL HISTORY:   Past Medical History:  Diagnosis Date  . Anemia   . Aortic regurgitation   . B-cell lymphoma ([place]) 2009   DX AT [facility]  . Bronchitis   . CAD (coronary artery disease)   . Carotid stenosis   . Diabetes mellitus without complication ([place])   . Diabetes mellitus, type 2 ([place])   . Emphysema of lung ([place])   . Essential hypertension   . History of chemotherapy   . Hyperlipidemia   . Hypertension   . IDA (iron deficiency anemia)   . Leg edema   . Meralgia paraesthetica   . Mitral regurgitation   . PUD (peptic ulcer disease)   . PVD (peripheral vascular disease) ([place])   . Tobacco abuse     PAST SURGICAL HISTORY:   Past Surgical  History:  Procedure Laterality Date  . CARDIAC CATHETERIZATION  08/15/2007  . CARDIAC CATHETERIZATION Right 07/13/2016   Procedure: Right/Left Heart Cath and Coronary Angiography;  Surgeon: Dionisio David, MD;  Location: [facility];  Service: Cardiovascular;  Laterality: Right;  . COLONOSCOPY  03/2013  . ESOPHAGOGASTRODUODENOSCOPY  03/2013    SOCIAL HISTORY:   Social History   Tobacco Use  . Smoking status: Former Smoker    Types: Cigarettes    Last attempt to quit: 08/30/2015    Years since quitting: 3.3  . Smokeless tobacco: Never Used  Substance Use Topics  . Alcohol use: No    Frequency: Never    FAMILY HISTORY:   Family History  Problem Relation Age of Onset  . CAD Mother   . Rheum arthritis Neg Hx   . Osteoarthritis Neg Hx   . Asthma Neg Hx   . Diabetes Neg Hx   . Cancer Neg Hx     DRUG ALLERGIES:  No Known Allergies  REVIEW OF SYSTEMS:   Review of Systems  Constitutional: Negative for fever and weight loss.  HENT: Negative for congestion, nosebleeds and tinnitus.   Eyes: Negative for blurred vision, double vision and redness.  Respiratory: Positive for cough and shortness of breath. Negative for hemoptysis.   Cardiovascular: Positive for chest pain. Negative for orthopnea, leg swelling and PND.  Gastrointestinal: Negative for  abdominal pain, diarrhea, melena, nausea and vomiting.  Genitourinary: Negative for dysuria, hematuria and urgency.  Musculoskeletal: Negative for falls and joint pain.  Neurological: Negative for dizziness, tingling, sensory change, focal weakness, seizures, weakness and headaches.  Endo/Heme/Allergies: Negative for polydipsia. Does not bruise/bleed easily.  Psychiatric/Behavioral: Negative for depression and memory loss. The patient is not nervous/anxious.     MEDICATIONS AT HOME:   Prior to Admission medications   Medication Sig Start Date End Date Taking? Authorizing Provider  albuterol (PROVENTIL HFA;VENTOLIN HFA) 108  (90 Base) MCG/ACT inhaler Inhale 2 puffs into the lungs every 6 (six) hours as needed for wheezing or shortness of breath. 09/24/17   Loletha Grayer, MD  amLODipine (NORVASC) 5 MG tablet Take 5 mg by mouth daily.  08/27/15   [provider]  blood glucose meter kit and supplies KIT Dispense based on patient and insurance preference. Use up to four times daily as directed. (FOR ICD-9 250.00, 250.01). 08/25/18   Henreitta Leber, MD  clopidogrel (PLAVIX) 75 MG tablet Take 75 mg by mouth daily.  08/27/15   [provider]  fluticasone (FLONASE) 50 MCG/ACT nasal spray Place 2 sprays into the nose daily as needed for allergies.    [provider]  furosemide (LASIX) 40 MG tablet Take 0.5 tablets (20 mg total) by mouth daily. 05/31/18 07/30/18  Henreitta Leber, MD  gabapentin (NEURONTIN) 100 MG capsule Take 1 capsule by mouth 2 (two) times daily.  09/22/15   [provider]  glimepiride (AMARYL) 2 MG tablet Take 1 tablet (2 mg total) by mouth 2 (two) times daily. 08/25/18 09/24/18  Henreitta Leber, MD  hydrALAZINE (APRESOLINE) 100 MG tablet Take 100 mg by mouth 3 (three) times daily.    [provider]  isosorbide mononitrate (IMDUR) 30 MG 24 hr tablet Take 30 mg by mouth daily.    [provider]  metoprolol (TOPROL-XL) 200 MG 24 hr tablet Take 1 tablet (200 mg total) by mouth daily. 09/24/17   Loletha Grayer, MD  mometasone-formoterol (DULERA) 100-5 MCG/ACT AERO Inhale 2 puffs into the lungs 2 (two) times daily. 05/31/18   Henreitta Leber, MD  pantoprazole (PROTONIX) 40 MG tablet Take 40 mg by mouth daily.    [provider]  rosuvastatin (CRESTOR) 40 MG tablet Take 40 mg by mouth daily.    [provider]  tiotropium (SPIRIVA HANDIHALER) 18 MCG inhalation capsule Place 1 capsule (18 mcg total) into inhaler and inhale daily. 05/31/18   Henreitta Leber, MD      VITAL SIGNS:  Blood pressure (!) 148/70, pulse (!) 114, resp. rate 19,  weight 88.5 kg, SpO2 (!) 87 %.  PHYSICAL EXAMINATION:  Physical Exam  GENERAL:  84 y.o.-year-old patient lying in the bed in no acute distress.  EYES: Pupils equal, round, reactive to light and accommodation. No scleral icterus. Extraocular muscles intact.  HEENT: Head atraumatic, normocephalic. Oropharynx and nasopharynx clear. No oropharyngeal erythema, moist oral mucosa  NECK:  Supple, no jugular venous distention. No thyroid enlargement, no tenderness.  LUNGS: Normal breath sounds bilaterally, no wheezing, rales, rhonchi. No use of accessory muscles of respiration.  CARDIOVASCULAR: S1, S2 RRR. No murmurs, rubs, gallops, clicks.  ABDOMEN: Soft, nontender, nondistended. Bowel sounds present. No organomegaly or mass.  EXTREMITIES: No pedal edema, cyanosis, or clubbing. + 2 pedal & radial pulses b/l.   NEUROLOGIC: Cranial nerves II through XII are intact. No focal Motor or sensory deficits appreciated b/l PSYCHIATRIC: The patient is alert  and oriented x 3.  SKIN: No obvious rash, lesion, or ulcer.   LABORATORY PANEL:   CBC Recent Labs  Lab 01/21/19 1156  WBC 12.6*  HGB 10.0*  HCT 33.3*  PLT 198   ------------------------------------------------------------------------------------------------------------------  Chemistries  Recent Labs  Lab 01/21/19 1156  NA 141  K 4.2  CL 102  CO2 30  GLUCOSE 193*  BUN 20  CREATININE 1.91*  CALCIUM 8.9  AST 17  ALT 14  ALKPHOS 66  BILITOT 0.9   ------------------------------------------------------------------------------------------------------------------  Cardiac Enzymes Recent Labs  Lab 01/21/19 1156  TROPONINI 0.19*   ------------------------------------------------------------------------------------------------------------------  RADIOLOGY:  Ct Head Wo Contrast  Result Date: 01/21/2019 CLINICAL DATA:  Altered mental status today.  Shortness of breath. EXAM: CT HEAD WITHOUT CONTRAST TECHNIQUE: Contiguous axial images  were obtained from the base of the skull through the vertex without intravenous contrast. COMPARISON:  Head CT 12/01/2016.  Brain MRI 12/02/2016. FINDINGS: Brain: No evidence of acute infarction, hemorrhage, hydrocephalus, extra-axial collection or mass lesion/mass effect. Vascular: No hyperdense vessel or unexpected calcification. Skull: Intact.  No focal lesion. Sinuses/Orbits: Negative. Other: None. IMPRESSION: Negative head CT. Electronically Signed   By: Inge Rise M.D.   On: 01/21/2019 14:02   Ct Angio Chest Pe W And/or Wo Contrast  Result Date: 01/21/2019 CLINICAL DATA:  Shortness of breath. EXAM: CT ANGIOGRAPHY CHEST WITH CONTRAST TECHNIQUE: Multidetector CT imaging of the chest was performed using the standard protocol during bolus administration of intravenous contrast. Multiplanar CT image reconstructions and MIPs were obtained to evaluate the vascular anatomy. CONTRAST:  72m OMNIPAQUE IOHEXOL 350 MG/ML SOLN COMPARISON:  CT chest dated July 12, 2016. FINDINGS: Cardiovascular: Satisfactory opacification of the pulmonary arteries to the segmental level. No evidence of pulmonary embolism. Unchanged mild cardiomegaly. No pericardial effusion. No thoracic aortic aneurysm or dissection. Coronary, aortic arch, and branch vessel atherosclerotic vascular disease. Mediastinum/Nodes: There are several prominent mediastinal lymph nodes measuring up to 1 cm in short axis, likely reactive. No axillary or hilar lymphadenopathy. The thyroid gland, trachea, and esophagus demonstrate no significant findings. Lungs/Pleura: Moderate centrilobular emphysema again noted. Diffuse peribronchial thickening. Patchy consolidation in the posterior right upper lobe. Dependent subsegmental atelectasis in the right lower lobe. No pleural effusion or pneumothorax. New 5 mm nodule along the right minor fissure (series 6, image 45). Small area of centrilobular nodularity in the anterior right middle lobe. Pleuroparenchymal  scarring in the lingula. Upper Abdomen: No acute abnormality. Musculoskeletal: No chest wall abnormality. No acute or significant osseous findings. Review of the MIP images confirms the above findings. IMPRESSION: 1.  No evidence of pulmonary embolism. 2. Patchy consolidation in the posterior right upper lobe, concerning for pneumonia. 3. Small area of centrilobular nodularity in the anterior right middle lobe likely represents infectious or inflammatory bronchiolitis. 4. New 5 mm nodule along the right minor fissure. No follow-up needed if patient is low-risk. Non-contrast chest CT can be considered in 12 months if patient is high-risk. This recommendation follows the consensus statement: Guidelines for Management of Incidental Pulmonary Nodules Detected on CT Images: From the Fleischner Society 2017; Radiology 2017; 284:228-243. 5.  Emphysema (ICD10-J43.9). 6.  Aortic atherosclerosis (ICD10-I70.0). Electronically Signed   By: WTitus DubinM.D.   On: 01/21/2019 14:15   Dg Chest Portable 1 View  Result Date: 01/21/2019 CLINICAL DATA:  Dyspnea. EXAM: PORTABLE CHEST 1 VIEW COMPARISON:  05/29/2018 FINDINGS: Mildly enlarged cardiac silhouette. Calcific atherosclerotic disease of the aorta. There is no evidence of focal airspace consolidation, pleural effusion or pneumothorax.  Osseous structures are without acute abnormality. Soft tissues are grossly normal. IMPRESSION: 1. Mildly enlarged cardiac silhouette. 2. Calcific atherosclerotic disease of the aorta. 3. No pulmonary edema or consolidation. Electronically Signed   By: Fidela Salisbury M.D.   On: 01/21/2019 12:50     IMPRESSION AND PLAN:   Brett [name]  is a 84 y.o. male with a known history of diabetes, hypertension, hyperlipidemia, peptic ulcer disease, peripheral vascular disease, history of lymphoma, coronary artery disease, carotid stenosis who presents to the hospital due to shortness of breath, chest pain.   1.  Pneumonia- this is the  source of patient's shortness of breath and pleuritic chest pain.  Patient CT of the chest is suggestive of right upper lobe consolidation. - Suspected to be community-acquired pneumonia.  We will treat the patient with IV ceftriaxone, Zithromax.  Follow clinically. -Follow cultures.  2.  Elevated troponin-likely in the setting of supply demand ischemia from hypoxemia and pneumonia. - We will observe on telemetry, cycle patient's cardiac markers.  If they trend up we will consider getting cardiology consult and getting an echocardiogram.  3.  Essential hypertension-continue Norvasc, Imdur, Toprol.  4.  History of peripheral vascular disease- continue Plavix, Crestor.  5.  GERD-continue Protonix.  6.  Hyperlipidemia-continue Crestor.  7.  Diabetes type 2 with neuropathy-continue glimepiride, will place on sliding scale insulin.  8.  Diabetic neuropathy-continue gabapentin.    All the records are reviewed and case discussed with ED provider. Management plans discussed with the patient, family and they are in agreement.  CODE STATUS: DNR  TOTAL TIME TAKING CARE OF THIS PATIENT: 40 minutes.    Henreitta Leber M.D on 01/21/2019 at 3:01 PM  Between 7am to 6pm - Pager - [phone]  After 6pm go to [email or web address] - password [email or web address] [facility]  Office  [phone]  CC: Primary care physician; Perrin Maltese, MD

## 2019-01-21 NOTE — Progress Notes (Signed)
CODE SEPSIS - PHARMACY COMMUNICATION  **Broad Spectrum Antibiotics should be administered within 1 hour of Sepsis diagnosis**  Time Code Sepsis Called/Page Received: @ 1434  Antibiotics Ordered: ceftriaxone and azithromycin   Time of 1st antibiotic administration: @ 1420   Additional action taken by pharmacy: N/A  If necessary, Name of Provider/Nurse Contacted: N/A  Pernell Dupre, PharmD, BCPS Clinical Pharmacist 01/21/2019 3:19 PM

## 2019-01-21 NOTE — ED Triage Notes (Signed)
Pt arrives via [facility] from home. Pt called for Resp difficult. Pt was  92%  2 L  FD arrival they increased to 3L 96-98. [place] sound clear end tital 35 98.5 Temp  122/80 CBG 233  HR 138   No hx of diab.   Per pt, he is on 2L chronically but FD turned him up to 4 L.

## 2019-01-21 NOTE — ED Notes (Signed)
Pt refused In and Out Cath at this time.Pt states, "I will pee when I have to I do not have to right now."

## 2019-01-22 LAB — CBC
HCT: 30.4 % — ABNORMAL LOW (ref 39.0–52.0)
Hemoglobin: 9.4 g/dL — ABNORMAL LOW (ref 13.0–17.0)
MCH: 26.6 pg (ref 26.0–34.0)
MCHC: 30.9 g/dL (ref 30.0–36.0)
MCV: 86.1 fL (ref 80.0–100.0)
Platelets: 179 10*3/uL (ref 150–400)
RBC: 3.53 MIL/uL — ABNORMAL LOW (ref 4.22–5.81)
RDW: 14.4 % (ref 11.5–15.5)
WBC: 10.4 10*3/uL (ref 4.0–10.5)
nRBC: 0 % (ref 0.0–0.2)

## 2019-01-22 LAB — GLUCOSE, CAPILLARY
Glucose-Capillary: 221 mg/dL — ABNORMAL HIGH (ref 70–99)
Glucose-Capillary: 190 mg/dL — ABNORMAL HIGH (ref 70–99)
Glucose-Capillary: 238 mg/dL — ABNORMAL HIGH (ref 70–99)
Glucose-Capillary: 185 mg/dL — ABNORMAL HIGH (ref 70–99)

## 2019-01-22 LAB — BASIC METABOLIC PANEL
Anion gap: 8 (ref 5–15)
BUN: 29 mg/dL — ABNORMAL HIGH (ref 8–23)
CO2: 29 mmol/L (ref 22–32)
Calcium: 8.3 mg/dL — ABNORMAL LOW (ref 8.9–10.3)
Chloride: 101 mmol/L (ref 98–111)
Creatinine, Ser: 1.94 mg/dL — ABNORMAL HIGH (ref 0.61–1.24)
GFR calc Af Amer: 36 mL/min — ABNORMAL LOW (ref >60)
GFR calc non Af Amer: 31 mL/min — ABNORMAL LOW (ref >60)
Glucose, Bld: 324 mg/dL — ABNORMAL HIGH (ref 70–99)
POTASSIUM: 4.4 mmol/L (ref 3.5–5.1)
Sodium: 138 mmol/L (ref 135–145)

## 2019-01-22 LAB — TROPONIN I: Troponin I: 0.06 ng/mL (ref <0.03)

## 2019-01-22 MED ORDER — SODIUM CHLORIDE 0.9% FLUSH
10.0000 mL | Freq: Two times a day (BID) | INTRAVENOUS | Status: DC
  Administered 2019-01-22 (×2): 10 mL via INTRAVENOUS

## 2019-01-22 MED ORDER — INSULIN DETEMIR 100 UNIT/ML ~~LOC~~ SOLN
12.0000 [IU] | Freq: Two times a day (BID) | SUBCUTANEOUS | Status: DC
  Administered 2019-01-22 – 2019-01-23 (×3): 12 [IU] via SUBCUTANEOUS
  Filled 2019-01-22 (×5): qty 0.12

## 2019-01-22 NOTE — Plan of Care (Signed)
  Problem: Education: Goal: Knowledge of General Education information will improve Description Including pain rating scale, medication(s)/side effects and non-pharmacologic comfort measures Outcome: Progressing   Problem: Health Behavior/Discharge Planning: Goal: Ability to manage health-related needs will improve Outcome: Progressing   Problem: Clinical Measurements: Goal: Ability to maintain clinical measurements within normal limits will improve Outcome: Progressing Goal: Will remain free from infection Outcome: Progressing Note:  Remains afebrile, continues on IV antibiotics Goal: Diagnostic test results will improve Outcome: Progressing Note:  BUN 29/1.29 troponins elevated due to demand ischemia   Problem: Clinical Measurements: Goal: Ability to maintain a body temperature in the normal range will improve Outcome: Progressing Note:  Remains afebrile   Problem: Clinical Measurements: Goal: Respiratory complications will improve Outcome: Not Progressing Note:  Lungs remain diminished

## 2019-01-22 NOTE — Progress Notes (Signed)
[place] [facility] [place] NAME: Brett [name]    MR#:  [id number]  DATE OF BIRTH:  September 06, 1935  SUBJECTIVE:  CHIEF COMPLAINT:   Chief Complaint  Patient presents with  . Respiratory Distress  Patient seen and evaluated today Decreased shortness of breath Currently on oxygen via nasal cannula at 3 L Has cough  REVIEW OF SYSTEMS:    ROS  CONSTITUTIONAL: No documented fever. Has fatigue, weakness. No weight gain, no weight loss.  EYES: No blurry or double vision.  ENT: No tinnitus. No postnasal drip. No redness of the oropharynx.  RESPIRATORY: Has cough, no wheeze, no hemoptysis. Has dyspnea.  CARDIOVASCULAR: No chest pain. No orthopnea. No palpitations. No syncope.  GASTROINTESTINAL: No nausea, no vomiting or diarrhea. No abdominal pain. No melena or hematochezia.  GENITOURINARY: No dysuria or hematuria.  ENDOCRINE: No polyuria or nocturia. No heat or cold intolerance.  HEMATOLOGY: No anemia. No bruising. No bleeding.  INTEGUMENTARY: No rashes. No lesions.  MUSCULOSKELETAL: No arthritis. No swelling. No gout.  NEUROLOGIC: No numbness, tingling, or ataxia. No seizure-type activity.  PSYCHIATRIC: No anxiety. No insomnia. No ADD.   DRUG ALLERGIES:  No Known Allergies  VITALS:  Blood pressure 123/62, pulse 73, temperature 97.6 F (36.4 C), temperature source Oral, resp. rate 20, height 5\' 11"  (1.803 m), weight 85.5 kg, SpO2 95 %.  PHYSICAL EXAMINATION:   Physical Exam  GENERAL:  84 y.o.-year-old patient lying in the bed with no acute distress.  EYES: Pupils equal, round, reactive to light and accommodation. No scleral icterus. Extraocular muscles intact.  HEENT: Head atraumatic, normocephalic. Oropharynx and nasopharynx clear.  NECK:  Supple, no jugular venous distention. No thyroid enlargement, no tenderness.  LUNGS: Decreased breath sounds bilaterally, rales heard in right lung. No use of accessory muscles of respiration.   CARDIOVASCULAR: S1, S2 normal. No murmurs, rubs, or gallops.  ABDOMEN: Soft, nontender, nondistended. Bowel sounds present. No organomegaly or mass.  EXTREMITIES: No cyanosis, clubbing or edema b/l.    NEUROLOGIC: Cranial nerves II through XII are intact. No focal Motor or sensory deficits b/l.   PSYCHIATRIC: The patient is alert and oriented x 3.  SKIN: No obvious rash, lesion, or ulcer.   LABORATORY PANEL:   CBC Recent Labs  Lab 01/22/19 0246  WBC 10.4  HGB 9.4*  HCT 30.4*  PLT 179   ------------------------------------------------------------------------------------------------------------------ Chemistries  Recent Labs  Lab 01/21/19 1156 01/22/19 0246  NA 141 138  K 4.2 4.4  CL 102 101  CO2 30 29  GLUCOSE 193* 324*  BUN 20 29*  CREATININE 1.91* 1.94*  CALCIUM 8.9 8.3*  AST 17  --   ALT 14  --   ALKPHOS 66  --   BILITOT 0.9  --    ------------------------------------------------------------------------------------------------------------------  Cardiac Enzymes Recent Labs  Lab 01/22/19 0246  TROPONINI 0.06*   ------------------------------------------------------------------------------------------------------------------  RADIOLOGY:  Ct Head Wo Contrast  Result Date: 01/21/2019 CLINICAL DATA:  Altered mental status today.  Shortness of breath. EXAM: CT HEAD WITHOUT CONTRAST TECHNIQUE: Contiguous axial images were obtained from the base of the skull through the vertex without intravenous contrast. COMPARISON:  Head CT 12/01/2016.  Brain MRI 12/02/2016. FINDINGS: Brain: No evidence of acute infarction, hemorrhage, hydrocephalus, extra-axial collection or mass lesion/mass effect. Vascular: No hyperdense vessel or unexpected calcification. Skull: Intact.  No focal lesion. Sinuses/Orbits: Negative. Other: None. IMPRESSION: Negative head CT. Electronically Signed   By: Inge Rise M.D.   On: 01/21/2019 14:02   Ct  Angio Chest Pe W And/or Wo Contrast  Result  Date: 01/21/2019 CLINICAL DATA:  Shortness of breath. EXAM: CT ANGIOGRAPHY CHEST WITH CONTRAST TECHNIQUE: Multidetector CT imaging of the chest was performed using the standard protocol during bolus administration of intravenous contrast. Multiplanar CT image reconstructions and MIPs were obtained to evaluate the vascular anatomy. CONTRAST:  63mL OMNIPAQUE IOHEXOL 350 MG/ML SOLN COMPARISON:  CT chest dated July 12, 2016. FINDINGS: Cardiovascular: Satisfactory opacification of the pulmonary arteries to the segmental level. No evidence of pulmonary embolism. Unchanged mild cardiomegaly. No pericardial effusion. No thoracic aortic aneurysm or dissection. Coronary, aortic arch, and branch vessel atherosclerotic vascular disease. Mediastinum/Nodes: There are several prominent mediastinal lymph nodes measuring up to 1 cm in short axis, likely reactive. No axillary or hilar lymphadenopathy. The thyroid gland, trachea, and esophagus demonstrate no significant findings. Lungs/Pleura: Moderate centrilobular emphysema again noted. Diffuse peribronchial thickening. Patchy consolidation in the posterior right upper lobe. Dependent subsegmental atelectasis in the right lower lobe. No pleural effusion or pneumothorax. New 5 mm nodule along the right minor fissure (series 6, image 45). Small area of centrilobular nodularity in the anterior right middle lobe. Pleuroparenchymal scarring in the lingula. Upper Abdomen: No acute abnormality. Musculoskeletal: No chest wall abnormality. No acute or significant osseous findings. Review of the MIP images confirms the above findings. IMPRESSION: 1.  No evidence of pulmonary embolism. 2. Patchy consolidation in the posterior right upper lobe, concerning for pneumonia. 3. Small area of centrilobular nodularity in the anterior right middle lobe likely represents infectious or inflammatory bronchiolitis. 4. New 5 mm nodule along the right minor fissure. No follow-up needed if patient is  low-risk. Non-contrast chest CT can be considered in 12 months if patient is high-risk. This recommendation follows the consensus statement: Guidelines for Management of Incidental Pulmonary Nodules Detected on CT Images: From the Fleischner Society 2017; Radiology 2017; 284:228-243. 5.  Emphysema (ICD10-J43.9). 6.  Aortic atherosclerosis (ICD10-I70.0). Electronically Signed   By: Titus Dubin M.D.   On: 01/21/2019 14:15   Dg Chest Portable 1 View  Result Date: 01/21/2019 CLINICAL DATA:  Dyspnea. EXAM: PORTABLE CHEST 1 VIEW COMPARISON:  05/29/2018 FINDINGS: Mildly enlarged cardiac silhouette. Calcific atherosclerotic disease of the aorta. There is no evidence of focal airspace consolidation, pleural effusion or pneumothorax. Osseous structures are without acute abnormality. Soft tissues are grossly normal. IMPRESSION: 1. Mildly enlarged cardiac silhouette. 2. Calcific atherosclerotic disease of the aorta. 3. No pulmonary edema or consolidation. Electronically Signed   By: Fidela Salisbury M.D.   On: 01/21/2019 12:50     ASSESSMENT AND PLAN:   84 year old male patient with history of type 2 diabetes mellitus, hypertension, hyperlipidemia, peptic ulcer disease, peripheral vascular disease, lymphoma, coronary disease presented to the hospital for shortness of breath  -Community-acquired pneumonia Continue IV Rocephin and Zithromax antibiotics Follow-up cultures Oxygen via nasal cannula  -Elevated troponin secondary to demand ischemia from pneumonia No complaints of any chest pain  -Hyperlipidemia Continue Crestor  -Type 2 diabetes mellitus uncontrolled Sliding scale coverage with insulin, glimepiride orally and add Levemir insulin  -Diabetic neuropathy Continue gabapentin  -Hypertension Continue Norvasc and Toprol along with Imdur  All the records are reviewed and case discussed with Care Management/Social Worker. Management plans discussed with the patient, family and they are in  agreement.  CODE STATUS: DNR  DVT Prophylaxis: SCDs  TOTAL TIME TAKING CARE OF THIS PATIENT: 36 minutes.   POSSIBLE D/C IN 2 to 3 DAYS, DEPENDING ON CLINICAL CONDITION.  Saundra Shelling M.D on 01/22/2019  at 10:28 AM  Between 7am to 6pm - Pager - [phone]  After 6pm go to [email or web address] - password [email or web address] [place] [facility]  Office  [phone]  CC: Primary care physician; Perrin Maltese, MD  Note: This dictation was prepared with Dragon dictation along with smaller phrase technology. Any transcriptional errors that result from this process are unintentional.

## 2019-01-22 NOTE — Progress Notes (Signed)
Inpatient Diabetes Program Recommendations  AACE/ADA: New Consensus Statement on Inpatient Glycemic Control (2015)  Target Ranges:  Prepandial:   less than 140 mg/dL      Peak postprandial:   less than 180 mg/dL (1-2 hours)      Critically ill patients:  140 - 180 mg/dL   Lab Results  Component Value Date   GLUCAP 221 (H) 01/22/2019   HGBA1C 11.9 (H) 08/24/2018    Review of Glycemic Control Results for ZAYVEON, RASCHKE (MRN [id number]) as of 01/22/2019 10:33  Ref. Range 01/21/2019 20:53 01/22/2019 07:37  Glucose-Capillary Latest Ref Range: 70 - 99 mg/dL 333 (H) 221 (H)   Diabetes history: DM 2 Outpatient Diabetes medications: Amaryl 2 mg bid  Current orders for Inpatient glycemic control:  Novolog sensitive tid with meals and HS, Amaryl 2 mg bid Levemir 12 units bid Inpatient Diabetes Program Recommendations:    Note that Levemir started today.  May consider reducing Levemir to 6 units bid (0.15 units/kg).  Also please recheck A1C. In September of 2019, A1C was 11.9% however there was concern regarding sending patient home on insulin due to vision and safety.  Will follow.    Thanks,  Adah Perl, RN, BC-ADM Inpatient Diabetes Coordinator Pager [phone] (8a-5p)

## 2019-01-22 NOTE — Progress Notes (Signed)
Advanced care plan. Purpose of the Encounter: CODE STATUS Parties in Attendance: Patient Patient's Decision Capacity: Okay Subjective/Patient's story: Presented to the emergency room for shortness of breath and difficulty breathing Objective/Medical story Patient has pneumonia needs IV antibiotics Uses oxygen at bedtime Needs inpatient hospitalization Goals of care determination:  Advance care directives goals of care and treatment plan discussed Patient does not want CPR, intubation ventilator if the need arises CODE STATUS: DNR Time spent discussing advanced care planning: 16 minutes

## 2019-01-23 LAB — GLUCOSE, CAPILLARY
Glucose-Capillary: 136 mg/dL — ABNORMAL HIGH (ref 70–99)
Glucose-Capillary: 137 mg/dL — ABNORMAL HIGH (ref 70–99)

## 2019-01-23 MED ORDER — AMOXICILLIN-POT CLAVULANATE 875-125 MG PO TABS: 1.0000 | ORAL_TABLET | Freq: Two times a day (BID) | ORAL | 0 refills | Status: AC

## 2019-01-23 MED ORDER — AMOXICILLIN-POT CLAVULANATE 875-125 MG PO TABS
1.0000 | ORAL_TABLET | Freq: Two times a day (BID) | ORAL | Status: DC
  Administered 2019-01-23: 1 via ORAL
  Filled 2019-01-23: qty 1

## 2019-01-23 NOTE — Discharge Summary (Signed)
[facility] NAME: Raciel Caffrey    MR#:  [id number]  DATE OF BIRTH:  December 02, 1934  DATE OF ADMISSION:  01/21/2019 ADMITTING PHYSICIAN: Henreitta Leber, MD  DATE OF DISCHARGE: No discharge date for patient encounter.  PRIMARY CARE PHYSICIAN: Perrin Maltese, MD   ADMISSION DIAGNOSIS:  Pulmonary nodule [R91.1] Community acquired pneumonia of right lung, unspecified part of lung [J18.9] Sepsis, due to unspecified organism, unspecified whether acute organ dysfunction present ([place]) [A41.9]  DISCHARGE DIAGNOSIS:  Community-acquired pneumonia Elevated troponin secondary to demand ischemia from pneumonia  SECONDARY DIAGNOSIS:   Past Medical History:  Diagnosis Date  . Anemia   . Aortic regurgitation   . B-cell lymphoma ([place]) 2009   DX AT [facility]  . Bronchitis   . CAD (coronary artery disease)   . Carotid stenosis   . Diabetes mellitus without complication ([place])   . Diabetes mellitus, type 2 ([place])   . Emphysema of lung ([place])   . Essential hypertension   . History of chemotherapy   . Hyperlipidemia   . Hypertension   . IDA (iron deficiency anemia)   . Leg edema   . Meralgia paraesthetica   . Mitral regurgitation   . PUD (peptic ulcer disease)   . PVD (peripheral vascular disease) ([place])   . Tobacco abuse      ADMITTING HISTORY Greyden Besecker  is a 84 y.o. male with a known history of diabetes, hypertension, hyperlipidemia, peptic ulcer disease, peripheral vascular disease, history of lymphoma, coronary artery disease, carotid stenosis who presents to the hospital due to shortness of breath, chest pain.  Patient says she has been having progressive shortness of breath now for the past few days and has had a cough which has been nonproductive.  He does say that he has been having pain on the right side of his chest when he coughs and when he tries to take a deep breath.  He presents to the ER is he had not been improving and  underwent a CT of the chest which was suggestive of right-sided pneumonia and hospitalist services were contacted for admission.  Patient denies any nausea or vomiting abdominal pain, melena, hematochezia, diarrhea or any other associated symptoms presently.  HOSPITAL COURSE:  Patient admitted to telemetry.  Serial troponins were cycled.  Patient received IV Rocephin and Zithromax antibiotic.  Tolerated antibiotics well.  No complaints of any chest pain or any EKG changes.  Troponins were elevated secondary to demand ischemia.  Pneumonia improved.  Patient on oxygen at home via nasal cannula.  Patient hemodynamically stable will be discharged home on oral Augmentin antibiotic today.  CONSULTS OBTAINED:  None  DRUG ALLERGIES:  No Known Allergies  DISCHARGE MEDICATIONS:   Allergies as of 01/23/2019   No Known Allergies     Medication List    TAKE these medications   albuterol 108 (90 Base) MCG/ACT inhaler Commonly known as:  PROVENTIL HFA;VENTOLIN HFA Inhale 2 puffs into the lungs every 6 (six) hours as needed for wheezing or shortness of breath.   amLODipine 5 MG tablet Commonly known as:  NORVASC Take 5 mg by mouth daily.   amoxicillin-clavulanate 875-125 MG tablet Commonly known as:  AUGMENTIN Take 1 tablet by mouth every 12 (twelve) hours for 5 days.   ATORVASTATIN CALCIUM PO Take by mouth daily.   blood glucose meter kit and supplies Kit Dispense based on patient and insurance preference. Use up to four times daily as directed. (  FOR ICD-9 250.00, 250.01).   clopidogrel 75 MG tablet Commonly known as:  PLAVIX Take 75 mg by mouth daily.   fluticasone 50 MCG/ACT nasal spray Commonly known as:  FLONASE Place 2 sprays into the nose daily as needed for allergies.   furosemide 40 MG tablet Commonly known as:  LASIX Take 0.5 tablets (20 mg total) by mouth daily.   gabapentin 100 MG capsule Commonly known as:  NEURONTIN Take 1 capsule by mouth 2 (two) times daily.    glimepiride 2 MG tablet Commonly known as:  AMARYL Take 1 tablet (2 mg total) by mouth 2 (two) times daily.   hydrALAZINE 100 MG tablet Commonly known as:  APRESOLINE Take 100 mg by mouth 3 (three) times daily.   isosorbide mononitrate 30 MG 24 hr tablet Commonly known as:  IMDUR Take 30 mg by mouth daily.   metoprolol 200 MG 24 hr tablet Commonly known as:  TOPROL-XL Take 1 tablet (200 mg total) by mouth daily.   mometasone-formoterol 100-5 MCG/ACT Aero Commonly known as:  DULERA Inhale 2 puffs into the lungs 2 (two) times daily. What changed:    when to take this  reasons to take this   pantoprazole 40 MG tablet Commonly known as:  PROTONIX Take 40 mg by mouth daily.   tiotropium 18 MCG inhalation capsule Commonly known as:  SPIRIVA HANDIHALER Place 1 capsule (18 mcg total) into inhaler and inhale daily.   Vitamin D (Ergocalciferol) 1.25 MG (50000 UT) Caps capsule Commonly known as:  DRISDOL Take 50,000 Units by mouth once a week.       Today  Patient seen and evaluated today No shortness of breath No fever Tolerating diet okay Hemodynamically stable  VITAL SIGNS:  Blood pressure 136/67, pulse 79, temperature 97.9 F (36.6 C), temperature source Oral, resp. rate 18, height _0  (1.803 m), weight 84.3 kg, SpO2 95 %.  I/O:    Intake/Output Summary (Last 24 hours) at 01/23/2019 1137 Last data filed at 01/23/2019 0940 Gross per 24 hour  Intake 720 ml  Output 1275 ml  Net -555 ml    PHYSICAL EXAMINATION:  Physical Exam  GENERAL:  84 y.o.-year-old patient lying in the bed with no acute distress.  LUNGS: Normal breath sounds bilaterally, no wheezing, rales,rhonchi or crepitation. No use of accessory muscles of respiration.  CARDIOVASCULAR: S1, S2 normal. No murmurs, rubs, or gallops.  ABDOMEN: Soft, non-tender, non-distended. Bowel sounds present. No organomegaly or mass.  NEUROLOGIC: Moves all 4 extremities. PSYCHIATRIC: The patient is alert and  oriented x 3.  SKIN: No obvious rash, lesion, or ulcer.   DATA REVIEW:   CBC Recent Labs  Lab 01/22/19 0246  WBC 10.4  HGB 9.4*  HCT 30.4*  PLT 179    Chemistries  Recent Labs  Lab 01/21/19 1156 01/22/19 0246  NA 141 138  K 4.2 4.4  CL 102 101  CO2 30 29  GLUCOSE 193* 324*  BUN 20 29*  CREATININE 1.91* 1.94*  CALCIUM 8.9 8.3*  AST 17  --   ALT 14  --   ALKPHOS 66  --   BILITOT 0.9  --     Cardiac Enzymes Recent Labs  Lab 01/22/19 0246  TROPONINI 0.06*    Microbiology Results  Results for orders placed or performed during the hospital encounter of 01/21/19  Blood Culture (routine x 2)     Status: None (Preliminary result)   Collection Time: 01/21/19  3:14 PM  Result Value Ref Range Status  Specimen Description BLOOD LEFT ARM  Final   Special Requests   Final    BOTTLES DRAWN AEROBIC AND ANAEROBIC Blood Culture results may not be optimal due to an inadequate volume of blood received in culture bottles   Culture   Final    NO GROWTH 2 DAYS Performed at [facility], [address], [place], [place] [address]    Report Status PENDING  Incomplete  Blood Culture (routine x 2)     Status: None (Preliminary result)   Collection Time: 01/21/19  8:01 PM  Result Value Ref Range Status   Specimen Description BLOOD LEFT HAND  Final   Special Requests   Final    BOTTLES DRAWN AEROBIC AND ANAEROBIC Blood Culture results may not be optimal due to an excessive volume of blood received in culture bottles   Culture   Final    NO GROWTH 2 DAYS Performed at [facility], [address] [place], [place] [address]    Report Status PENDING  Incomplete    RADIOLOGY:  Ct Head Wo Contrast  Result Date: 01/21/2019 CLINICAL DATA:  Altered mental status today.  Shortness of breath. EXAM: CT HEAD WITHOUT CONTRAST TECHNIQUE: Contiguous axial images were obtained from the base of the skull through the vertex without intravenous contrast. COMPARISON:  Head CT  12/01/2016.  Brain MRI 12/02/2016. FINDINGS: Brain: No evidence of acute infarction, hemorrhage, hydrocephalus, extra-axial collection or mass lesion/mass effect. Vascular: No hyperdense vessel or unexpected calcification. Skull: Intact.  No focal lesion. Sinuses/Orbits: Negative. Other: None. IMPRESSION: Negative head CT. Electronically Signed   By: Inge Rise M.D.   On: 01/21/2019 14:02   Ct Angio Chest Pe W And/or Wo Contrast  Result Date: 01/21/2019 CLINICAL DATA:  Shortness of breath. EXAM: CT ANGIOGRAPHY CHEST WITH CONTRAST TECHNIQUE: Multidetector CT imaging of the chest was performed using the standard protocol during bolus administration of intravenous contrast. Multiplanar CT image reconstructions and MIPs were obtained to evaluate the vascular anatomy. CONTRAST:  56m OMNIPAQUE IOHEXOL 350 MG/ML SOLN COMPARISON:  CT chest dated July 12, 2016. FINDINGS: Cardiovascular: Satisfactory opacification of the pulmonary arteries to the segmental level. No evidence of pulmonary embolism. Unchanged mild cardiomegaly. No pericardial effusion. No thoracic aortic aneurysm or dissection. Coronary, aortic arch, and branch vessel atherosclerotic vascular disease. Mediastinum/Nodes: There are several prominent mediastinal lymph nodes measuring up to 1 cm in short axis, likely reactive. No axillary or hilar lymphadenopathy. The thyroid gland, trachea, and esophagus demonstrate no significant findings. Lungs/Pleura: Moderate centrilobular emphysema again noted. Diffuse peribronchial thickening. Patchy consolidation in the posterior right upper lobe. Dependent subsegmental atelectasis in the right lower lobe. No pleural effusion or pneumothorax. New 5 mm nodule along the right minor fissure (series 6, image 45). Small area of centrilobular nodularity in the anterior right middle lobe. Pleuroparenchymal scarring in the lingula. Upper Abdomen: No acute abnormality. Musculoskeletal: No chest wall abnormality. No  acute or significant osseous findings. Review of the MIP images confirms the above findings. IMPRESSION: 1.  No evidence of pulmonary embolism. 2. Patchy consolidation in the posterior right upper lobe, concerning for pneumonia. 3. Small area of centrilobular nodularity in the anterior right middle lobe likely represents infectious or inflammatory bronchiolitis. 4. New 5 mm nodule along the right minor fissure. No follow-up needed if patient is low-risk. Non-contrast chest CT can be considered in 12 months if patient is high-risk. This recommendation follows the consensus statement: Guidelines for Management of Incidental Pulmonary Nodules Detected on CT Images: From the  Fleischner Society 2017; Radiology 2017; 284:228-243. 5.  Emphysema (ICD10-J43.9). 6.  Aortic atherosclerosis (ICD10-I70.0). Electronically Signed   By: Titus Dubin M.D.   On: 01/21/2019 14:15   Dg Chest Portable 1 View  Result Date: 01/21/2019 CLINICAL DATA:  Dyspnea. EXAM: PORTABLE CHEST 1 VIEW COMPARISON:  05/29/2018 FINDINGS: Mildly enlarged cardiac silhouette. Calcific atherosclerotic disease of the aorta. There is no evidence of focal airspace consolidation, pleural effusion or pneumothorax. Osseous structures are without acute abnormality. Soft tissues are grossly normal. IMPRESSION: 1. Mildly enlarged cardiac silhouette. 2. Calcific atherosclerotic disease of the aorta. 3. No pulmonary edema or consolidation. Electronically Signed   By: Fidela Salisbury M.D.   On: 01/21/2019 12:50    Follow up with PCP in 1 week.  Management plans discussed with the patient, family and they are in agreement.  CODE STATUS: DNR    Code Status Orders  (From admission, onward)         Start     Ordered   01/21/19 1852  Do not attempt resuscitation (DNR)  Continuous    Question Answer Comment  In the event of cardiac or respiratory ARREST Do not call a "code blue"   In the event of cardiac or respiratory ARREST Do not perform  Intubation, CPR, defibrillation or ACLS   In the event of cardiac or respiratory ARREST Use medication by any route, position, wound care, and other measures to relive pain and suffering. May use oxygen, suction and manual treatment of airway obstruction as needed for comfort.      01/21/19 1852        Code Status History    Date Active Date Inactive Code Status Order ID Comments User Context   08/24/2018 0149 08/25/2018 1536 DNR [id number]  Amelia Jo, MD Inpatient   05/29/2018 1513 05/31/2018 1617 DNR [id number]  Gladstone Lighter, MD Inpatient   12/30/2017 1815 01/01/2018 1453 Full Code [id number]  Vaughan Basta, MD Inpatient   12/05/2017 2146 12/08/2017 1428 Full Code [id number]  Fritzi Mandes, MD Inpatient   11/13/2017 2123 11/16/2017 1704 Full Code [id number]  Idelle Crouch, MD Inpatient   09/22/2017 0036 09/24/2017 1837 Full Code [id number]  Salary, Avel Peace, MD Inpatient   09/22/2017 0033 09/22/2017 0036 DNR [id number]  Salary, Avel Peace, MD ED   12/02/2016 0812 12/03/2016 2009 Full Code [id number]  Vaughan Basta, MD Inpatient   07/11/2016 0018 07/11/2016 1707 Full Code [id number]  Lance Coon, MD Inpatient   06/18/2016 0340 06/21/2016 1844 Full Code [id number]  Saundra Shelling, MD ED      TOTAL TIME TAKING CARE OF THIS PATIENT ON DAY OF DISCHARGE: more than 35 minutes.   Saundra Shelling M.D on 01/23/2019 at 11:37 AM  Between 7am to 6pm - Pager - [phone]  After 6pm go to [email or web address] - password [email or web address] [place]  Office  [phone]  CC: Primary care physician; Perrin Maltese, MD  Note: This dictation was prepared with Dragon dictation along with smaller phrase technology. Any transcriptional errors that result from this process are unintentional.

## 2019-01-26 LAB — CULTURE, BLOOD (ROUTINE X 2)
Culture: NO GROWTH
Culture: NO GROWTH

## 2019-02-09 ENCOUNTER — Encounter: Payer: Self-pay | Admitting: Emergency Medicine

## 2019-02-09 ENCOUNTER — Other Ambulatory Visit: Payer: Self-pay

## 2019-02-09 ENCOUNTER — Inpatient Hospital Stay
Admission: EM | Admit: 2019-02-09 | Discharge: 2019-02-12 | DRG: 190 | Disposition: A | Discharge status: Home Health | Payer: Medicare PPO | Attending: Internal Medicine | Admitting: Internal Medicine

## 2019-02-09 ENCOUNTER — Emergency Department: Payer: Medicare PPO

## 2019-02-09 DIAGNOSIS — H548 Legal blindness, as defined in USA: Secondary | ICD-10-CM

## 2019-02-09 DIAGNOSIS — Z8701 Personal history of pneumonia (recurrent): Secondary | ICD-10-CM

## 2019-02-09 DIAGNOSIS — E1151 Type 2 diabetes mellitus with diabetic peripheral angiopathy without gangrene: Secondary | ICD-10-CM | POA: Diagnosis present

## 2019-02-09 DIAGNOSIS — E1122 Type 2 diabetes mellitus with diabetic chronic kidney disease: Secondary | ICD-10-CM | POA: Diagnosis present

## 2019-02-09 DIAGNOSIS — Z8711 Personal history of peptic ulcer disease: Secondary | ICD-10-CM | POA: Diagnosis not present

## 2019-02-09 DIAGNOSIS — E1165 Type 2 diabetes mellitus with hyperglycemia: Secondary | ICD-10-CM | POA: Diagnosis present

## 2019-02-09 DIAGNOSIS — B974 Respiratory syncytial virus as the cause of diseases classified elsewhere: Secondary | ICD-10-CM | POA: Diagnosis present

## 2019-02-09 DIAGNOSIS — E119 Type 2 diabetes mellitus without complications: Secondary | ICD-10-CM

## 2019-02-09 DIAGNOSIS — H9190 Unspecified hearing loss, unspecified ear: Secondary | ICD-10-CM

## 2019-02-09 DIAGNOSIS — J439 Emphysema, unspecified: Secondary | ICD-10-CM | POA: Diagnosis not present

## 2019-02-09 DIAGNOSIS — I080 Rheumatic disorders of both mitral and aortic valves: Secondary | ICD-10-CM | POA: Diagnosis present

## 2019-02-09 DIAGNOSIS — J9620 Acute and chronic respiratory failure, unspecified whether with hypoxia or hypercapnia: Secondary | ICD-10-CM

## 2019-02-09 DIAGNOSIS — I2510 Atherosclerotic heart disease of native coronary artery without angina pectoris: Secondary | ICD-10-CM | POA: Diagnosis present

## 2019-02-09 DIAGNOSIS — Z9221 Personal history of antineoplastic chemotherapy: Secondary | ICD-10-CM

## 2019-02-09 DIAGNOSIS — D649 Anemia, unspecified: Secondary | ICD-10-CM | POA: Diagnosis not present

## 2019-02-09 DIAGNOSIS — D509 Iron deficiency anemia, unspecified: Secondary | ICD-10-CM | POA: Diagnosis present

## 2019-02-09 DIAGNOSIS — N183 Chronic kidney disease, stage 3 (moderate): Secondary | ICD-10-CM | POA: Diagnosis present

## 2019-02-09 DIAGNOSIS — Z87891 Personal history of nicotine dependence: Secondary | ICD-10-CM

## 2019-02-09 DIAGNOSIS — R079 Chest pain, unspecified: Secondary | ICD-10-CM | POA: Diagnosis not present

## 2019-02-09 DIAGNOSIS — R6511 Systemic inflammatory response syndrome (SIRS) of non-infectious origin with acute organ dysfunction: Secondary | ICD-10-CM | POA: Diagnosis present

## 2019-02-09 DIAGNOSIS — Z8572 Personal history of non-Hodgkin lymphomas: Secondary | ICD-10-CM

## 2019-02-09 DIAGNOSIS — Z9981 Dependence on supplemental oxygen: Secondary | ICD-10-CM | POA: Diagnosis not present

## 2019-02-09 DIAGNOSIS — J441 Chronic obstructive pulmonary disease with (acute) exacerbation: Secondary | ICD-10-CM | POA: Diagnosis present

## 2019-02-09 DIAGNOSIS — Z8249 Family history of ischemic heart disease and other diseases of the circulatory system: Secondary | ICD-10-CM

## 2019-02-09 DIAGNOSIS — Z79899 Other long term (current) drug therapy: Secondary | ICD-10-CM | POA: Diagnosis not present

## 2019-02-09 DIAGNOSIS — K219 Gastro-esophageal reflux disease without esophagitis: Secondary | ICD-10-CM | POA: Diagnosis present

## 2019-02-09 DIAGNOSIS — D631 Anemia in chronic kidney disease: Secondary | ICD-10-CM | POA: Diagnosis present

## 2019-02-09 DIAGNOSIS — Z7984 Long term (current) use of oral hypoglycemic drugs: Secondary | ICD-10-CM

## 2019-02-09 DIAGNOSIS — Z7951 Long term (current) use of inhaled steroids: Secondary | ICD-10-CM

## 2019-02-09 DIAGNOSIS — E785 Hyperlipidemia, unspecified: Secondary | ICD-10-CM | POA: Diagnosis present

## 2019-02-09 DIAGNOSIS — J9600 Acute respiratory failure, unspecified whether with hypoxia or hypercapnia: Secondary | ICD-10-CM

## 2019-02-09 DIAGNOSIS — J9621 Acute and chronic respiratory failure with hypoxia: Secondary | ICD-10-CM | POA: Diagnosis present

## 2019-02-09 DIAGNOSIS — R0602 Shortness of breath: Secondary | ICD-10-CM | POA: Diagnosis present

## 2019-02-09 DIAGNOSIS — Z7902 Long term (current) use of antithrombotics/antiplatelets: Secondary | ICD-10-CM

## 2019-02-09 DIAGNOSIS — I129 Hypertensive chronic kidney disease with stage 1 through stage 4 chronic kidney disease, or unspecified chronic kidney disease: Secondary | ICD-10-CM | POA: Diagnosis present

## 2019-02-09 DIAGNOSIS — J432 Centrilobular emphysema: Secondary | ICD-10-CM

## 2019-02-09 DIAGNOSIS — J9601 Acute respiratory failure with hypoxia: Secondary | ICD-10-CM

## 2019-02-09 LAB — RESPIRATORY PANEL BY PCR
Adenovirus: NOT DETECTED
Bordetella pertussis: NOT DETECTED
CORONAVIRUS HKU1-RVPPCR: NOT DETECTED
Chlamydophila pneumoniae: NOT DETECTED
Coronavirus 229E: NOT DETECTED
Coronavirus NL63: NOT DETECTED
Coronavirus OC43: NOT DETECTED
Influenza A: NOT DETECTED
Influenza B: NOT DETECTED
Metapneumovirus: NOT DETECTED
Mycoplasma pneumoniae: NOT DETECTED
PARAINFLUENZA VIRUS 2-RVPPCR: NOT DETECTED
Parainfluenza Virus 1: NOT DETECTED
Parainfluenza Virus 3: NOT DETECTED
Parainfluenza Virus 4: NOT DETECTED
Respiratory Syncytial Virus: DETECTED — AB
Rhinovirus / Enterovirus: NOT DETECTED

## 2019-02-09 LAB — COMPREHENSIVE METABOLIC PANEL
ALBUMIN: 3.9 g/dL (ref 3.5–5.0)
ALT: 18 U/L (ref 0–44)
AST: 21 U/L (ref 15–41)
Alkaline Phosphatase: 78 U/L (ref 38–126)
Anion gap: 8 (ref 5–15)
BUN: 32 mg/dL — ABNORMAL HIGH (ref 8–23)
CO2: 29 mmol/L (ref 22–32)
Calcium: 8.4 mg/dL — ABNORMAL LOW (ref 8.9–10.3)
Chloride: 105 mmol/L (ref 98–111)
Creatinine, Ser: 1.85 mg/dL — ABNORMAL HIGH (ref 0.61–1.24)
GFR calc Af Amer: 38 mL/min — ABNORMAL LOW (ref >60)
GFR calc non Af Amer: 33 mL/min — ABNORMAL LOW (ref >60)
GLUCOSE: 135 mg/dL — AB (ref 70–99)
Potassium: 4.4 mmol/L (ref 3.5–5.1)
Sodium: 142 mmol/L (ref 135–145)
Total Bilirubin: 0.5 mg/dL (ref 0.3–1.2)
Total Protein: 7.5 g/dL (ref 6.5–8.1)

## 2019-02-09 LAB — CBC WITH DIFFERENTIAL/PLATELET
Abs Immature Granulocytes: 0.02 10*3/uL (ref 0.00–0.07)
Basophils Absolute: 0 10*3/uL (ref 0.0–0.1)
Basophils Relative: 0 %
Eosinophils Absolute: 0.2 10*3/uL (ref 0.0–0.5)
Eosinophils Relative: 4 %
HCT: 33.8 % — ABNORMAL LOW (ref 39.0–52.0)
Hemoglobin: 10.3 g/dL — ABNORMAL LOW (ref 13.0–17.0)
Immature Granulocytes: 0 %
Lymphocytes Relative: 10 %
Lymphs Abs: 0.6 10*3/uL — ABNORMAL LOW (ref 0.7–4.0)
MCH: 26.3 pg (ref 26.0–34.0)
MCHC: 30.5 g/dL (ref 30.0–36.0)
MCV: 86.4 fL (ref 80.0–100.0)
Monocytes Absolute: 0.6 10*3/uL (ref 0.1–1.0)
Monocytes Relative: 11 %
Neutro Abs: 4.3 10*3/uL (ref 1.7–7.7)
Neutrophils Relative %: 75 %
PLATELETS: 205 10*3/uL (ref 150–400)
RBC: 3.91 MIL/uL — ABNORMAL LOW (ref 4.22–5.81)
RDW: 15.6 % — ABNORMAL HIGH (ref 11.5–15.5)
WBC: 5.8 10*3/uL (ref 4.0–10.5)
nRBC: 0 % (ref 0.0–0.2)

## 2019-02-09 LAB — LACTIC ACID, PLASMA
Lactic Acid, Venous: 0.8 mmol/L (ref 0.5–1.9)
Lactic Acid, Venous: 1.1 mmol/L (ref 0.5–1.9)

## 2019-02-09 LAB — PHOSPHORUS: Phosphorus: 3.7 mg/dL (ref 2.5–4.6)

## 2019-02-09 LAB — GLUCOSE, CAPILLARY
GLUCOSE-CAPILLARY: 293 mg/dL — AB (ref 70–99)
Glucose-Capillary: 163 mg/dL — ABNORMAL HIGH (ref 70–99)
Glucose-Capillary: 244 mg/dL — ABNORMAL HIGH (ref 70–99)
Glucose-Capillary: 353 mg/dL — ABNORMAL HIGH (ref 70–99)
Glucose-Capillary: 254 mg/dL — ABNORMAL HIGH (ref 70–99)

## 2019-02-09 LAB — URINALYSIS, COMPLETE (UACMP) WITH MICROSCOPIC
Bacteria, UA: NONE SEEN
Bilirubin Urine: NEGATIVE
Glucose, UA: NEGATIVE mg/dL
Ketones, ur: NEGATIVE mg/dL
Leukocytes,Ua: NEGATIVE
NITRITE: NEGATIVE
Protein, ur: NEGATIVE mg/dL
Specific Gravity, Urine: 1.005 (ref 1.005–1.030)
WBC, UA: NONE SEEN WBC/hpf (ref 0–5)
pH: 5 (ref 5.0–8.0)

## 2019-02-09 LAB — INFLUENZA PANEL BY PCR (TYPE A & B)
Influenza A By PCR: NEGATIVE
Influenza B By PCR: NEGATIVE

## 2019-02-09 LAB — PROCALCITONIN: Procalcitonin: <0.1 ng/mL

## 2019-02-09 LAB — BRAIN NATRIURETIC PEPTIDE: B Natriuretic Peptide: 126 pg/mL — ABNORMAL HIGH (ref 0.0–100.0)

## 2019-02-09 LAB — MAGNESIUM: Magnesium: 2.1 mg/dL (ref 1.7–2.4)

## 2019-02-09 LAB — MRSA PCR SCREENING: MRSA by PCR: NEGATIVE

## 2019-02-09 MED ORDER — SODIUM CHLORIDE 0.9 % IV SOLN
500.0000 mg | INTRAVENOUS | Status: DC
  Administered 2019-02-09: 500 mg via INTRAVENOUS
  Filled 2019-02-09: qty 500

## 2019-02-09 MED ORDER — ISOSORBIDE MONONITRATE ER 30 MG PO TB24
30.0000 mg | ORAL_TABLET | Freq: Every day | ORAL | Status: DC
  Administered 2019-02-09 – 2019-02-12 (×4): 30 mg via ORAL
  Filled 2019-02-09 (×4): qty 1

## 2019-02-09 MED ORDER — BISACODYL 5 MG PO TBEC: 5.0000 mg | DELAYED_RELEASE_TABLET | Freq: Every day | ORAL | Status: DC | PRN

## 2019-02-09 MED ORDER — INSULIN ASPART 100 UNIT/ML ~~LOC~~ SOLN
0.0000 [IU] | Freq: Three times a day (TID) | SUBCUTANEOUS | Status: DC
  Administered 2019-02-09: 15 [IU] via SUBCUTANEOUS
  Administered 2019-02-09: 5 [IU] via SUBCUTANEOUS
  Administered 2019-02-09 – 2019-02-10 (×3): 8 [IU] via SUBCUTANEOUS
  Administered 2019-02-10: 11 [IU] via SUBCUTANEOUS
  Administered 2019-02-11: 8 [IU] via SUBCUTANEOUS
  Administered 2019-02-11 – 2019-02-12 (×3): 11 [IU] via SUBCUTANEOUS
  Administered 2019-02-12: 08:00:00 8 [IU] via SUBCUTANEOUS
  Filled 2019-02-09 (×11): qty 1

## 2019-02-09 MED ORDER — FUROSEMIDE 10 MG/ML IJ SOLN
40.0000 mg | Freq: Once | INTRAMUSCULAR | Status: AC
  Administered 2019-02-09: 40 mg via INTRAVENOUS
  Filled 2019-02-09: qty 4

## 2019-02-09 MED ORDER — PANTOPRAZOLE SODIUM 40 MG PO TBEC
40.0000 mg | DELAYED_RELEASE_TABLET | Freq: Every day | ORAL | Status: DC
  Administered 2019-02-09 – 2019-02-12 (×4): 40 mg via ORAL
  Filled 2019-02-09 (×4): qty 1

## 2019-02-09 MED ORDER — DOXYCYCLINE HYCLATE 100 MG PO TABS
100.0000 mg | ORAL_TABLET | Freq: Two times a day (BID) | ORAL | Status: DC
  Administered 2019-02-09 – 2019-02-12 (×7): 100 mg via ORAL
  Filled 2019-02-09 (×7): qty 1

## 2019-02-09 MED ORDER — CLOPIDOGREL BISULFATE 75 MG PO TABS
75.0000 mg | ORAL_TABLET | Freq: Every day | ORAL | Status: DC
  Administered 2019-02-09 – 2019-02-12 (×4): 75 mg via ORAL
  Filled 2019-02-09 (×4): qty 1

## 2019-02-09 MED ORDER — ROSUVASTATIN CALCIUM 20 MG PO TABS
40.0000 mg | ORAL_TABLET | Freq: Every day | ORAL | Status: DC
  Administered 2019-02-09 – 2019-02-11 (×3): 40 mg via ORAL
  Filled 2019-02-09 (×3): qty 2

## 2019-02-09 MED ORDER — HYDRALAZINE HCL 50 MG PO TABS
100.0000 mg | ORAL_TABLET | Freq: Three times a day (TID) | ORAL | Status: DC
  Administered 2019-02-09 – 2019-02-12 (×11): 100 mg via ORAL
  Filled 2019-02-09 (×11): qty 2

## 2019-02-09 MED ORDER — AMLODIPINE BESYLATE 5 MG PO TABS
5.0000 mg | ORAL_TABLET | Freq: Every day | ORAL | Status: DC
  Administered 2019-02-09 – 2019-02-12 (×4): 5 mg via ORAL
  Filled 2019-02-09 (×6): qty 1

## 2019-02-09 MED ORDER — SODIUM CHLORIDE 0.9 % IV SOLN
2.0000 g | Freq: Once | INTRAVENOUS | Status: AC
  Administered 2019-02-09: 2 g via INTRAVENOUS
  Filled 2019-02-09 (×2): qty 2

## 2019-02-09 MED ORDER — MOMETASONE FURO-FORMOTEROL FUM 100-5 MCG/ACT IN AERO
2.0000 | INHALATION_SPRAY | Freq: Two times a day (BID) | RESPIRATORY_TRACT | Status: DC
  Filled 2019-02-09: qty 8.8

## 2019-02-09 MED ORDER — IPRATROPIUM-ALBUTEROL 0.5-2.5 (3) MG/3ML IN SOLN
3.0000 mL | Freq: Four times a day (QID) | RESPIRATORY_TRACT | Status: DC
  Administered 2019-02-09 – 2019-02-10 (×5): 3 mL via RESPIRATORY_TRACT
  Filled 2019-02-09 (×6): qty 3

## 2019-02-09 MED ORDER — METHYLPREDNISOLONE SODIUM SUCC 125 MG IJ SOLR
60.0000 mg | Freq: Two times a day (BID) | INTRAMUSCULAR | Status: AC
  Administered 2019-02-09 (×2): 60 mg via INTRAVENOUS
  Filled 2019-02-09 (×2): qty 2

## 2019-02-09 MED ORDER — IPRATROPIUM-ALBUTEROL 0.5-2.5 (3) MG/3ML IN SOLN
3.0000 mL | RESPIRATORY_TRACT | Status: DC
  Administered 2019-02-09 (×2): 3 mL via RESPIRATORY_TRACT
  Filled 2019-02-09 (×2): qty 3

## 2019-02-09 MED ORDER — GABAPENTIN 100 MG PO CAPS
100.0000 mg | ORAL_CAPSULE | Freq: Two times a day (BID) | ORAL | Status: DC
  Administered 2019-02-09 – 2019-02-12 (×7): 100 mg via ORAL
  Filled 2019-02-09 (×7): qty 1

## 2019-02-09 MED ORDER — IPRATROPIUM-ALBUTEROL 0.5-2.5 (3) MG/3ML IN SOLN
3.0000 mL | Freq: Once | RESPIRATORY_TRACT | Status: AC
  Administered 2019-02-09: 3 mL via RESPIRATORY_TRACT

## 2019-02-09 MED ORDER — INSULIN ASPART 100 UNIT/ML ~~LOC~~ SOLN
0.0000 [IU] | Freq: Every day | SUBCUTANEOUS | Status: DC
  Administered 2019-02-09 – 2019-02-10 (×2): 3 [IU] via SUBCUTANEOUS
  Administered 2019-02-11: 23:00:00 2 [IU] via SUBCUTANEOUS
  Filled 2019-02-09 (×3): qty 1

## 2019-02-09 MED ORDER — METOPROLOL SUCCINATE ER 50 MG PO TB24
200.0000 mg | ORAL_TABLET | Freq: Every day | ORAL | Status: DC
  Administered 2019-02-09 – 2019-02-12 (×4): 200 mg via ORAL
  Filled 2019-02-09 (×5): qty 4

## 2019-02-09 MED ORDER — ONDANSETRON HCL 4 MG PO TABS: 4.0000 mg | ORAL_TABLET | Freq: Four times a day (QID) | ORAL | Status: DC | PRN

## 2019-02-09 MED ORDER — ACETAMINOPHEN 650 MG RE SUPP: 650.0000 mg | Freq: Four times a day (QID) | RECTAL | Status: DC | PRN

## 2019-02-09 MED ORDER — ACETAMINOPHEN 325 MG PO TABS: 650.0000 mg | ORAL_TABLET | Freq: Four times a day (QID) | ORAL | Status: DC | PRN

## 2019-02-09 MED ORDER — SENNOSIDES-DOCUSATE SODIUM 8.6-50 MG PO TABS
1.0000 | ORAL_TABLET | Freq: Every evening | ORAL | Status: DC | PRN
  Administered 2019-02-11: 08:00:00 1 via ORAL
  Filled 2019-02-09: qty 1

## 2019-02-09 MED ORDER — VANCOMYCIN HCL IN DEXTROSE 1-5 GM/200ML-% IV SOLN
1000.0000 mg | Freq: Once | INTRAVENOUS | Status: DC
  Filled 2019-02-09: qty 200

## 2019-02-09 MED ORDER — VANCOMYCIN HCL 10 G IV SOLR
2000.0000 mg | Freq: Once | INTRAVENOUS | Status: AC
  Administered 2019-02-09: 2000 mg via INTRAVENOUS
  Filled 2019-02-09: qty 2000

## 2019-02-09 MED ORDER — PREDNISONE 20 MG PO TABS
40.0000 mg | ORAL_TABLET | Freq: Every day | ORAL | Status: DC
  Administered 2019-02-10: 08:00:00 40 mg via ORAL
  Filled 2019-02-09: qty 2

## 2019-02-09 MED ORDER — METHYLPREDNISOLONE SODIUM SUCC 125 MG IJ SOLR
125.0000 mg | Freq: Once | INTRAMUSCULAR | Status: AC
  Administered 2019-02-09: 125 mg via INTRAVENOUS
  Filled 2019-02-09: qty 2

## 2019-02-09 MED ORDER — IPRATROPIUM-ALBUTEROL 0.5-2.5 (3) MG/3ML IN SOLN
RESPIRATORY_TRACT | Status: AC
  Filled 2019-02-09: qty 6

## 2019-02-09 MED ORDER — BUDESONIDE 0.5 MG/2ML IN SUSP
0.5000 mg | Freq: Two times a day (BID) | RESPIRATORY_TRACT | Status: AC
  Administered 2019-02-09 – 2019-02-10 (×4): 0.5 mg via RESPIRATORY_TRACT
  Filled 2019-02-09 (×5): qty 2

## 2019-02-09 MED ORDER — ENOXAPARIN SODIUM 40 MG/0.4ML ~~LOC~~ SOLN
40.0000 mg | SUBCUTANEOUS | Status: DC
  Administered 2019-02-09 – 2019-02-10 (×2): 40 mg via SUBCUTANEOUS
  Filled 2019-02-09 (×2): qty 0.4

## 2019-02-09 MED ORDER — ONDANSETRON HCL 4 MG/2ML IJ SOLN: 4.0000 mg | Freq: Four times a day (QID) | INTRAMUSCULAR | Status: DC | PRN

## 2019-02-09 NOTE — Progress Notes (Signed)
Pt transported to ICU on BIPAP without incident. Pt remains on Bipap and is tol well at this time. Report given to ICU RT.

## 2019-02-09 NOTE — H&P (Signed)
Brett [name] at [place] NAME: Brett [name]    MR#:  [id number]  DATE OF BIRTH:  1934-12-23  DATE OF ADMISSION:  02/09/2019  PRIMARY CARE PHYSICIAN: Perrin Maltese, MD   REQUESTING/REFERRING PHYSICIAN: Merlyn Lot, MD  CHIEF COMPLAINT:   Chief Complaint  Patient presents with  . Shortness of Breath    HISTORY OF PRESENT ILLNESS:  Brett [name]  is a 84 y.o. male with a litany of chronic medical conditions, most importantly Hx severe COPD (2L [place] home O2), now p/w acute on chronic hypoxemic respiratory failure, acute COPD exacerbation. Pt on BiPAP at the time of my assessment. He is also severely hard of hearing. Family is not available at bedside. I am only able to obtain a limited Hx/ROS. Endorses SOB, onset "yesterday" (Thurs 02/08/2019), progressively worsening. Endorses cough, dry/non-productive. Tachypnoeic and hypoxic; SIRS (+), but afebrile, (-) leukocytosis. CXR (-) infiltrate. Does not appear toxic. Endorses improvement on BiPAP, (-) active respiratory distress at the time of my assessment.  Admitted to [facility] 01/21/2019 - 01/23/2019 for community acquired bacterial pneumonia.  CXR on present admission (-) infiltrate.  PAST MEDICAL HISTORY:   Past Medical History:  Diagnosis Date  . Anemia   . Aortic regurgitation   . B-cell lymphoma ([place]) 2009   DX AT [facility]  . Bronchitis   . CAD (coronary artery disease)   . Carotid stenosis   . Diabetes mellitus without complication ([place])   . Diabetes mellitus, type 2 ([place])   . Emphysema of lung ([place])   . Essential hypertension   . History of chemotherapy   . Hyperlipidemia   . Hypertension   . IDA (iron deficiency anemia)   . Leg edema   . Meralgia paraesthetica   . Mitral regurgitation   . PUD (peptic ulcer disease)   . PVD (peripheral vascular disease) ([place])   . Tobacco abuse     PAST SURGICAL HISTORY:   Past Surgical History:  Procedure Laterality Date  . CARDIAC  CATHETERIZATION  08/15/2007  . CARDIAC CATHETERIZATION Right 07/13/2016   Procedure: Right/Left Heart Cath and Coronary Angiography;  Surgeon: Dionisio David, MD;  Location: [facility];  Service: Cardiovascular;  Laterality: Right;  . COLONOSCOPY  03/2013  . ESOPHAGOGASTRODUODENOSCOPY  03/2013    SOCIAL HISTORY:   Social History   Tobacco Use  . Smoking status: Former Smoker    Types: Cigarettes    Last attempt to quit: 08/30/2015    Years since quitting: 3.4  . Smokeless tobacco: Never Used  Substance Use Topics  . Alcohol use: No    Frequency: Never    FAMILY HISTORY:   Family History  Problem Relation Age of Onset  . CAD Mother   . Rheum arthritis Neg Hx   . Osteoarthritis Neg Hx   . Asthma Neg Hx   . Diabetes Neg Hx   . Cancer Neg Hx     DRUG ALLERGIES:  No Known Allergies  REVIEW OF SYSTEMS:   Review of Systems  Unable to perform ROS: Severe respiratory distress  Respiratory: Positive for cough and shortness of breath.    On BiPAP. Severely hard of hearing. MEDICATIONS AT HOME:   Prior to Admission medications   Medication Sig Start Date End Date Taking? Authorizing Provider  albuterol (PROVENTIL HFA;VENTOLIN HFA) 108 (90 Base) MCG/ACT inhaler Inhale 2 puffs into the lungs every 6 (six) hours as needed for wheezing or shortness of breath. 09/24/17  Loletha Grayer, MD  amLODipine (NORVASC) 5 MG tablet Take 5 mg by mouth daily.  08/27/15   [provider]  ATORVASTATIN CALCIUM PO Take by mouth daily.    [provider]  blood glucose meter kit and supplies KIT Dispense based on patient and insurance preference. Use up to four times daily as directed. (FOR ICD-9 250.00, 250.01). 08/25/18   Henreitta Leber, MD  clopidogrel (PLAVIX) 75 MG tablet Take 75 mg by mouth daily.  08/27/15   [provider]  fluticasone (FLONASE) 50 MCG/ACT nasal spray Place 2 sprays into the nose daily as needed for allergies.    [provider]   furosemide (LASIX) 40 MG tablet Take 0.5 tablets (20 mg total) by mouth daily. 05/31/18 01/21/19  Henreitta Leber, MD  gabapentin (NEURONTIN) 100 MG capsule Take 1 capsule by mouth 2 (two) times daily.  09/22/15   [provider]  glimepiride (AMARYL) 2 MG tablet Take 1 tablet (2 mg total) by mouth 2 (two) times daily. 08/25/18 01/21/19  Henreitta Leber, MD  hydrALAZINE (APRESOLINE) 100 MG tablet Take 100 mg by mouth 3 (three) times daily.    [provider]  isosorbide mononitrate (IMDUR) 30 MG 24 hr tablet Take 30 mg by mouth daily.    [provider]  metoprolol (TOPROL-XL) 200 MG 24 hr tablet Take 1 tablet (200 mg total) by mouth daily. 09/24/17   Loletha Grayer, MD  mometasone-formoterol (DULERA) 100-5 MCG/ACT AERO Inhale 2 puffs into the lungs 2 (two) times daily. Patient taking differently: Inhale 2 puffs into the lungs 2 (two) times daily as needed for wheezing or shortness of breath.  05/31/18   Henreitta Leber, MD  pantoprazole (PROTONIX) 40 MG tablet Take 40 mg by mouth daily.    [provider]  tiotropium (SPIRIVA HANDIHALER) 18 MCG inhalation capsule Place 1 capsule (18 mcg total) into inhaler and inhale daily. 05/31/18   Henreitta Leber, MD  Vitamin D, Ergocalciferol, (DRISDOL) 1.25 MG (50000 UT) CAPS capsule Take 50,000 Units by mouth once a week.    [provider]   VITAL SIGNS:  Blood pressure (!) 144/67, pulse 80, temperature 99.7 F (37.6 C), temperature source Rectal, resp. rate 14, height 5' 11"  (1.803 m), weight 84.3 kg, SpO2 99 %.  PHYSICAL EXAMINATION:  Physical Exam Constitutional:      General: He is not in acute distress.    Appearance: He is ill-appearing. He is not toxic-appearing or diaphoretic.     Interventions: He is not intubated. HENT:     Head: Atraumatic.     Mouth/Throat:     Pharynx: Oropharynx is clear.  Eyes:     General: No scleral icterus.    Extraocular Movements: Extraocular movements intact.      Conjunctiva/sclera: Conjunctivae normal.  Neck:     Musculoskeletal: Neck supple.  Cardiovascular:     Rate and Rhythm: Normal rate and regular rhythm.     Heart sounds: Normal heart sounds. No murmur. No friction rub. No gallop.   Pulmonary:     Effort: No tachypnea, bradypnea, accessory muscle usage, respiratory distress or retractions. He is not intubated.     Breath sounds: Decreased air movement present. No stridor or transmitted upper airway sounds. Examination of the right-upper field reveals decreased breath sounds and wheezing. Examination of the left-upper field reveals decreased breath sounds and wheezing. Examination of the right-middle field reveals decreased breath sounds and wheezing. Examination of the left-middle field reveals decreased  breath sounds and wheezing. Examination of the right-lower field reveals decreased breath sounds and wheezing. Examination of the left-lower field reveals decreased breath sounds and wheezing. Decreased breath sounds and wheezing present. No rhonchi or rales.  Abdominal:     General: Bowel sounds are normal. There is no distension.     Palpations: Abdomen is soft.     Tenderness: There is no abdominal tenderness. There is no guarding or rebound.  Musculoskeletal: Normal range of motion.        General: No swelling or tenderness.     Right lower leg: No edema.     Left lower leg: No edema.  Lymphadenopathy:     Cervical: No cervical adenopathy.  Skin:    General: Skin is warm and dry.     Findings: No erythema or rash.  Neurological:     Mental Status: He is alert. Mental status is at baseline.  Psychiatric:        Mood and Affect: Mood normal.        Behavior: Behavior normal.        Thought Content: Thought content normal.        Judgment: Judgment normal.   On BiPAP. Severely hard of hearing. Diffusely diminished, diffuse wheezing on lung exam. LABORATORY PANEL:   CBC Recent Labs  Lab 02/09/19 0033  WBC 5.8  HGB 10.3*  HCT  33.8*  PLT 205   ------------------------------------------------------------------------------------------------------------------  Chemistries  Recent Labs  Lab 02/09/19 0033  NA 142  K 4.4  CL 105  CO2 29  GLUCOSE 135*  BUN 32*  CREATININE 1.85*  CALCIUM 8.4*  AST 21  ALT 18  ALKPHOS 78  BILITOT 0.5   ------------------------------------------------------------------------------------------------------------------  Cardiac Enzymes No results for input(s): TROPONINI in the last 168 hours. ------------------------------------------------------------------------------------------------------------------  RADIOLOGY:  Dg Chest Port 1 View  Result Date: 02/09/2019 CLINICAL DATA:  Dyspnea EXAM: PORTABLE CHEST 1 VIEW COMPARISON:  01/21/2019 CXR and chest CT FINDINGS: Stable cardiomegaly with mild aortic atherosclerosis. Mild central vascular congestion with bibasilar atelectasis. No significant pleural effusion or pneumothorax. No acute osseous abnormality. Degenerative change noted about the acromioclavicular and glenohumeral joints. IMPRESSION: Cardiomegaly with mild vascular congestion and bibasilar atelectasis. Aortic atherosclerosis without aneurysm. Electronically Signed   By: Ashley Royalty M.D.   On: 02/09/2019 01:27   IMPRESSION AND PLAN:   A/P: 17M w/ litany of chronic medical conditions, most importantly Hx severe COPD (2L [place] home O2), now p/w acute on chronic hypoxemic respiratory failure, acute COPD exacerbation. Hyperglycemia (w/ T2NIDDM), Cr elevation/CKD III, normocytic anemia. -Acute on chronic hypoxemic respiratory failure, acute COPD exacerbation: Hypoxic, wheezing. Tachypnoeic and hypoxic; SIRS (+), but afebrile, (-) leukocytosis. Lac 0.8. Flu (-). CXR (-) infiltrate. Does not appear toxic. DuoNebs + Budesonide nebs, steroids (IV, transition to PO), Azithromycin, incentive spirometry, pulmonary toileting, O2, pulse ox. -Hyperglycemia, T2NIDDM: SSI. Hold PO  antihyperglycemics. -Cr elevation, CKD III: Cr 1.85 on present admission. Baseline appears to be 1.9. Underlying CKD III (2/2 DM, HTN, aged kidney). Monitor BMP, avoid nephrotoxins. -Normocytic anemia: Hgb 10.3, stable. No evidence of active/acute ongoing blood loss at present time. -c/w other home meds/formulary subs as tolerated. -FEN/GI: Cardiac diabetic diet. -DVT PPx: Lovenox. -Code status: Full code. -Disposition: Admission, > 2 midnights.   All the records are reviewed and case discussed with ED provider. Management plans discussed with the patient, family and they are in agreement.  CODE STATUS: Full code.  TOTAL TIME TAKING CARE OF THIS PATIENT: 75 minutes.  Arta Silence M.D on 02/09/2019 at 3:49 AM  Between 7am to 6pm - Pager - [phone]  After 6pm go to [email or web address] - Proofreader  [facility]  Office  [phone]  CC: Primary care physician; Perrin Maltese, MD   Note: This dictation was prepared with Dragon dictation along with smaller phrase technology. Any transcriptional errors that result from this process are unintentional.

## 2019-02-09 NOTE — Progress Notes (Signed)
ID Pt has RSV  No need to test for SARS COv 2

## 2019-02-09 NOTE — ED Provider Notes (Addendum)
[facility] Emergency Department Provider Note    First MD Initiated Contact with Patient 02/09/19 0031     (approximate)  I have reviewed the triage vital signs and the nursing notes.   HISTORY  Chief Complaint Shortness of Breath  Level V Caveat:  Respiratory distress  HPI Brett [name] is a 84 y.o. male medical history as listed below presents with worsening shortness of breath and productive cough.  Patient was admitted to the hospital 3 weeks ago for pneumonia.  Found to be hypoxic on his home oxygen down to low 80s with moderate to severe respiratory distress.  The EMS he was started on CPAP with improvement in his oxygenation.  Rise here tonight's any chest pain but was reportedly having pain earlier.  Is very hard of hearing further limiting his provided history in addition to him having difficulty speaking due to respiratory distress.  He was given 2 nebulizers in route.    Past Medical History:  Diagnosis Date   Anemia    Aortic regurgitation    B-cell lymphoma ([place]) 2009   DX AT [facility]   Bronchitis    CAD (coronary artery disease)    Carotid stenosis    Diabetes mellitus without complication ([facility])    Diabetes mellitus, type 2 (H[facility])    Emphysema of lung ([facility])    Essential hypertension    History of chemotherapy    Hyperlipidemia    Hypertension    IDA (iron deficiency anemia)    Leg edema    Meralgia paraesthetica    Mitral regurgitation    PUD (peptic ulcer disease)    PVD (peripheral vascular disease) ([facility])    Tobacco abuse    Family History  Problem Relation Age of Onset   CAD Mother    Rheum arthritis Neg Hx    Osteoarthritis Neg Hx    Asthma Neg Hx    Diabetes Neg Hx    Cancer Neg Hx    Past Surgical History:  Procedure Laterality Date   CARDIAC CATHETERIZATION  08/15/2007   CARDIAC CATHETERIZATION Right 07/13/2016   Procedure: Right/Left Heart Cath and Coronary Angiography;  Surgeon: Dionisio David, MD;  Location: [facility];  Service: Cardiovascular;  Laterality: Right;   COLONOSCOPY  03/2013   ESOPHAGOGASTRODUODENOSCOPY  03/2013   Patient Active Problem List   Diagnosis Date Noted   Acute on chronic respiratory failure with hypoxemia ([place]) 02/09/2019   Pneumonia 01/21/2019   Type 2 diabetes mellitus with hyperosmolar nonketotic hyperglycemia ([place]) 08/24/2018   CHF exacerbation ([place]) 05/29/2018   COPD exacerbation ([place]) 12/05/2017   CAP (community acquired pneumonia) 11/13/2017   Acute respiratory failure ([place]) 11/13/2017   SIRS (systemic inflammatory response syndrome) ([place]) 11/13/2017   Stroke ([place]) 12/02/2016   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 Authorizing Provider  albuterol (PROVENTIL HFA;VENTOLIN HFA) 108 (90 Base) MCG/ACT inhaler Inhale 2 puffs into the lungs every 6 (six) hours as needed for wheezing or shortness of breath. 09/24/17  Yes  Loletha Grayer, MD  amLODipine (NORVASC) 5 MG tablet Take 5 mg by mouth daily.  08/27/15  Yes [provider]  ATORVASTATIN CALCIUM PO Take by mouth daily.   Yes [provider]  blood glucose meter kit and supplies KIT Dispense based on patient and insurance preference. Use up to four times daily as directed. (FOR ICD-9 250.00, 250.01). 08/25/18  Yes Sainani, Belia Heman, MD  clopidogrel (PLAVIX) 75 MG tablet Take 75 mg by mouth daily.  08/27/15  Yes [provider]    fluticasone (FLONASE) 50 MCG/ACT nasal spray Place 2 sprays into the nose daily as needed for allergies.   Yes [provider]  furosemide (LASIX) 40 MG tablet Take 0.5 tablets (20 mg total) by mouth daily. 05/31/18 02/10/19 Yes Sainani, Belia Heman, MD  gabapentin (NEURONTIN) 100 MG capsule Take 1 capsule by mouth 2 (two) times daily.  09/22/15  Yes [provider]  glimepiride (AMARYL) 2 MG tablet Take 1 tablet (2 mg total) by mouth 2 (two) times daily. 08/25/18 02/10/19 Yes Sainani, Belia Heman, MD  hydrALAZINE (APRESOLINE) 100 MG tablet Take 100 mg by mouth 3 (three) times daily.   Yes [provider]  isosorbide mononitrate (IMDUR) 30 MG 24 hr tablet Take 30 mg by mouth daily.   Yes [provider]  metoprolol (TOPROL-XL) 200 MG 24 hr tablet Take 1 tablet (200 mg total) by mouth daily. 09/24/17  Yes Wieting, Richard, MD  mometasone-formoterol (DULERA) 100-5 MCG/ACT AERO Inhale 2 puffs into the lungs 2 (two) times daily. Patient taking differently: Inhale 2 puffs into the lungs 2 (two) times daily as needed for wheezing or shortness of breath.  05/31/18  Yes Sainani, Belia Heman, MD  pantoprazole (PROTONIX) 40 MG tablet Take 40 mg by mouth daily.   Yes [provider]  tiotropium (SPIRIVA HANDIHALER) 18 MCG inhalation capsule Place 1 capsule (18 mcg total) into inhaler and inhale daily. 05/31/18  Yes Sainani, Belia Heman, MD  Vitamin D, Ergocalciferol, (DRISDOL) 1.25 MG (50000 UT) CAPS capsule Take 50,000 Units by mouth once a week.   Yes [provider]  doxycycline (VIBRA-TABS) 100 MG tablet Take 1 tablet (100 mg total) by mouth every 12 (twelve) hours for 3 days. 02/12/19 02/15/19  Vaughan Basta, MD  ipratropium-albuterol (DUONEB) 0.5-2.5 (3) MG/3ML SOLN Take 3 mLs by nebulization every 6 (six) hours as needed. 02/11/19   Henreitta Leber, MD  predniSONE (DELTASONE) 10 MG tablet Label  & dispense according to the schedule below. 5 Pills PO for 2 days then, 4  Pills PO for 2 days, 3 Pills PO for 2 days, 2 Pills PO for 2 days, 1 Pill PO for 2 days then STOP. 02/11/19   Henreitta Leber, MD    Allergies Patient has no known allergies.    Social History Social History   Tobacco Use   Smoking status: Former Smoker    Types: Cigarettes    Last attempt to quit: 08/30/2015    Years since quitting: 3.4   Smokeless tobacco: Never Used  Substance Use Topics   Alcohol use: No    Frequency: Never   Drug use: No    Review of Systems Patient denies headaches, rhinorrhea, blurry vision, numbness, shortness of breath, chest pain, edema, cough, abdominal pain, nausea, vomiting, diarrhea, dysuria, fevers, rashes or hallucinations unless otherwise stated above in HPI. ____________________________________________   PHYSICAL EXAM:  VITAL SIGNS: Vitals:   02/11/19 2054 02/12/19 0600  BP:  118/64  Pulse:  75  Resp:  17  Temp:  97.8 F (36.6 C)  SpO2: 96% 96%    Constitutional: Alert very HOH, in moderate respiratory distress Eyes: Conjunctivae are normal.  Head: Atraumatic. Nose: No congestion/rhinnorhea. Mouth/Throat: Mucous membranes are moist.   Neck: No stridor. Painless ROM.  Cardiovascular: Normal rate, regular rhythm. Grossly normal heart sounds.  Good peripheral circulation. Respiratory: tachypnea with use of accessory muscles,  Moderate respiratory distress requiring cpap Gastrointestinal: Soft and nontender. No distention. No abdominal bruits. No CVA tenderness. Genitourinary:  Musculoskeletal: No lower extremity tenderness nor edema.  No joint effusions. Neurologic:  Normal speech and language. No gross focal neurologic deficits are appreciated. No facial droop Skin:  Skin is warm, dry and intact. No rash noted. Psychiatric: Mood and affect are normal. Speech and behavior are normal.  ____________________________________________   LABS (all labs ordered are listed, but only abnormal results are displayed)  No results  found for this or any previous visit (from the past 24 hour(s)). ____________________________________________  EKG My review and personal interpretation at Time: 0:49   Indication: resp distress  Rate: 90  Rhythm: sinus Axis: normal Other: no stemi, nonsepcific st abn ____________________________________________  RADIOLOGY  I personally reviewed all radiographic images ordered to evaluate for the above acute complaints and reviewed radiology reports and findings.  These findings were personally discussed with the patient.  Please see medical record for radiology report.  ____________________________________________   PROCEDURES  Procedure(s) performed:  .Critical Care Performed by: Merlyn Lot, MD Authorized by: Merlyn Lot, MD   Critical care provider statement:    Critical care time (minutes):  30   Critical care time was exclusive of:  Separately billable procedures and treating other patients   Critical care was time spent personally by me on the following activities:  Development of treatment plan with patient or surrogate, discussions with consultants, evaluation of patient's response to treatment, examination of patient, obtaining history from patient or surrogate, ordering and performing treatments and interventions, ordering and review of laboratory studies, ordering and review of radiographic studies, pulse oximetry, re-evaluation of patient's condition and review of old charts      Critical Care performed: yes ____________________________________________   INITIAL IMPRESSION / ASSESSMENT AND PLAN / ED COURSE  Pertinent labs & imaging results that were available during my care of the patient were reviewed by me and considered in my medical decision making (see chart for details).   DDX: Asthma, copd, CHF, pna, ptx, malignancy, Pe, anemia   ALLEX LAPOINT is a 84 y.o. who presents to the ED with respiratory failure with acute respiratory distress as  described above.  Patient placed on BiPAP.  Has having some improvement after nebulizer treatment.  Will give steroids.  Will evaluate for pneumonia.  Does not currently have any fever but is in respiratory distress.  Anticipate admission.  Clinical Course as of Feb 14 1537  Fri Feb 09, 2019  0219 Patient with lymphocytopenia.  Flu negative.  Will add on RVP.  Lactate is normal.  Does have low-grade temperature.  Does appear more comfortable on BiPAP.  Will give dose of Lasix as well as steroids due to mixed picture.  Will be given broad-spectrum antibiotics.  Patient will require hospitalization for further medical management.   [PR]    Clinical Course User Index [PR] Merlyn Lot, MD     As part of my medical decision making, I reviewed the following data within the [place] notes reviewed and incorporated,  Labs reviewed, notes from prior ED visits.   ____________________________________________   FINAL CLINICAL IMPRESSION(S) / ED DIAGNOSES  Final diagnoses:  COPD exacerbation ([place])  Acute respiratory failure with hypoxia ([place])      NEW MEDICATIONS STARTED DURING THIS VISIT:  Discharge Medication List as of 02/12/2019  1:17 PM    START taking these medications   Details  ipratropium-albuterol (DUONEB) 0.5-2.5 (3) MG/3ML SOLN Take 3 mLs by nebulization every 6 (six) hours as needed., Starting Sun 02/11/2019, Print    predniSONE (DELTASONE) 10 MG tablet Label  & dispense according to the schedule below. 5 Pills PO for 2 days then, 4 Pills PO for 2 days, 3 Pills PO for 2 days, 2 Pills PO for 2 days, 1 Pill PO for 2 days then STOP., Print         Note:  This document was prepared using Dragon voice recognition software and may include unintentional dictation errors.    Merlyn Lot, MD 02/09/19 Natasha Mead    Merlyn Lot, MD 02/15/19 1538

## 2019-02-09 NOTE — ED Triage Notes (Signed)
Pt arrives via [facility] from home with c/o shortness of breath. Pt states he has had ongoing shortness of breath for 2-3 days. Pt recently diagnosed with pneumonia 3 weeks ago. Pt c/o chest pain associated with shortness of breath. EMS reports that they could not hear breath sounds on pt. Pt oxygen saturation 81% on room air when ems arrived. Pt arrived with CPAP in place. Respiratory at bedside. Pt chronically uses 2L nasal cannula. Pt respirations currently even and labored.

## 2019-02-09 NOTE — Progress Notes (Signed)
Notified Infection Prevention Team about patient's case. Spoke with Dr. Baxter Flattery (infectious disease) who does not think patient needs to be tested for COVID 19 at this time. Per Dr. Storm Frisk recommendations patient to stay on Droplet and Contact precautions at this time. Dr. Baxter Flattery to notifiy Dr. Delaine Lame (infectious disease) about the case.

## 2019-02-09 NOTE — Consult Note (Signed)
Name: Brett [name] MRN: [id number] DOB: 1935/11/22    ADMISSION DATE:  02/09/2019 CONSULTATION DATE:  02/09/2019  REFERRING MD :  Dr. Ignacia Felling  CHIEF COMPLAINT:  Shortness of Breath  BRIEF PATIENT DESCRIPTION:  84 year old male admitted with acute hypoxic respiratory failure secondary to COPD exacerbation requiring BiPAP.  SIGNIFICANT EVENTS  02/09/19>> Admission to [facility]:  Echocardiogram 02/09/19>>  CULTURES: MRSA PCR 3/13>> Influenza PCR 3/13>> Respiratory Viral PCR 3/13>> Sputum 3/13>>  ANTIBIOTICS: Azithromycin 3/13>> Cefepime 3/13 x 1 dose  HISTORY OF PRESENT ILLNESS:   Brett [name] is a 84 year old male past medical history notable for COPD on 2 L home oxygen, CAD, diabetes, hypertension, atrial and mitral regurgitation who presented to [facility] ED on 02/08/2019 with complaints of shortness of breath.  He reports that he has been short of breath for approximately 2 to 3 days along with nonproductive cough.  He was recently treated approximately 2 to 3 weeks ago for community-acquired pneumonia.  Upon arrival to the ED he was noted to be hypoxic with O2 saturations in the low 80s with moderate to severe respiratory distress.  He was also noted to have low-grade temperature of 99.7 Fahrenheit. Given his respiratory distress he was placed on BiPAP and administered bronchodilators, steroids, and 40 mg IV Lasix with noted improvement in his respiratory status.  Initial work-up in the ED reveals WBC 5.8, lactic acid 0.8, creatinine 1.85.  Chest x-ray is concerning for cardiomegaly with mild vascular congestion and bibasilar atelectasis.  Influenza PCR is negative.  Respiratory viral panel is pending.  Urinalysis is negative for UTI.  He is admitted to [facility] stepdown unit for treatment of acute hypoxic respiratory failure in the setting of COPD exacerbation requiring BiPAP.  PCCM was consulted for further management.  PAST MEDICAL HISTORY :   has a past medical history  of Anemia, Aortic regurgitation, B-cell lymphoma ([place]) (2009), Bronchitis, CAD (coronary artery disease), Carotid stenosis, Diabetes mellitus without complication ([place]), Diabetes mellitus, type 2 ([place]), Emphysema of lung ([place]), Essential hypertension, History of chemotherapy, Hyperlipidemia, Hypertension, IDA (iron deficiency anemia), Leg edema, Meralgia paraesthetica, Mitral regurgitation, PUD (peptic ulcer disease), PVD (peripheral vascular disease) ([place]), and Tobacco abuse.  has a past surgical history that includes Cardiac catheterization (08/15/2007); Esophagogastroduodenoscopy (03/2013); Colonoscopy (03/2013); and Cardiac catheterization (Right, 07/13/2016). Prior to Admission medications   Medication Sig Start Date End Date Taking? Authorizing Provider  albuterol (PROVENTIL HFA;VENTOLIN HFA) 108 (90 Base) MCG/ACT inhaler Inhale 2 puffs into the lungs every 6 (six) hours as needed for wheezing or shortness of breath. 09/24/17   Loletha Grayer, MD  amLODipine (NORVASC) 5 MG tablet Take 5 mg by mouth daily.  08/27/15   [provider]  ATORVASTATIN CALCIUM PO Take by mouth daily.    [provider]  blood glucose meter kit and supplies KIT Dispense based on patient and insurance preference. Use up to four times daily as directed. (FOR ICD-9 250.00, 250.01). 08/25/18   Henreitta Leber, MD  clopidogrel (PLAVIX) 75 MG tablet Take 75 mg by mouth daily.  08/27/15   [provider]  fluticasone (FLONASE) 50 MCG/ACT nasal spray Place 2 sprays into the nose daily as needed for allergies.    [provider]  furosemide (LASIX) 40 MG tablet Take 0.5 tablets (20 mg total) by mouth daily. 05/31/18 01/21/19  Henreitta Leber, MD  gabapentin (NEURONTIN) 100 MG capsule Take 1 capsule by mouth 2 (two) times daily.  09/22/15   [provider]  glimepiride (AMARYL) 2 MG tablet Take 1 tablet (2 mg total) by mouth 2 (two) times daily. 08/25/18 01/21/19  Henreitta Leber, MD   hydrALAZINE (APRESOLINE) 100 MG tablet Take 100 mg by mouth 3 (three) times daily.    [provider]  isosorbide mononitrate (IMDUR) 30 MG 24 hr tablet Take 30 mg by mouth daily.    [provider]  metoprolol (TOPROL-XL) 200 MG 24 hr tablet Take 1 tablet (200 mg total) by mouth daily. 09/24/17   Loletha Grayer, MD  mometasone-formoterol (DULERA) 100-5 MCG/ACT AERO Inhale 2 puffs into the lungs 2 (two) times daily. Patient taking differently: Inhale 2 puffs into the lungs 2 (two) times daily as needed for wheezing or shortness of breath.  05/31/18   Henreitta Leber, MD  pantoprazole (PROTONIX) 40 MG tablet Take 40 mg by mouth daily.    [provider]  tiotropium (SPIRIVA HANDIHALER) 18 MCG inhalation capsule Place 1 capsule (18 mcg total) into inhaler and inhale daily. 05/31/18   Henreitta Leber, MD  Vitamin D, Ergocalciferol, (DRISDOL) 1.25 MG (50000 UT) CAPS capsule Take 50,000 Units by mouth once a week.    [provider]   No Known Allergies  FAMILY HISTORY:  family history includes CAD in his mother. SOCIAL HISTORY:  reports that he quit smoking about 3 years ago. His smoking use included cigarettes. He has never used smokeless tobacco. He reports that he does not drink alcohol or use drugs.  REVIEW OF SYSTEMS:  Positives in BOLD Constitutional: Negative for fever, chills, weight loss, malaise/fatigue and diaphoresis.  HENT: Negative for hearing loss, ear pain, nosebleeds, congestion, sore throat, neck pain, tinnitus and ear discharge.   Eyes: Negative for blurred vision, double vision, photophobia, pain, discharge and redness.  Respiratory: Negative for +cough, hemoptysis, sputum production, shortness of breath, wheezing and stridor.   Cardiovascular: Negative for chest pain, palpitations, orthopnea, claudication, leg swelling and PND.  Gastrointestinal: Negative for heartburn, nausea, vomiting, abdominal pain, diarrhea, constipation, blood in stool  and melena.  Genitourinary: Negative for dysuria, urgency, frequency, hematuria and flank pain.  Musculoskeletal: Negative for myalgias, back pain, joint pain and falls.  Skin: Negative for itching and rash.  Neurological: Negative for dizziness, tingling, tremors, sensory change, speech change, focal weakness, seizures, loss of consciousness, weakness and headaches.  Endo/Heme/Allergies: Negative for environmental allergies and polydipsia. Does not bruise/bleed easily.  SUBJECTIVE:  Patient currently denies shortness of breath, reports improvement on BiPAP Does report nonproductive cough Denies chest pain, fever or chills, or edema Wants to try and come off BiPAP  VITAL SIGNS: Temp:  [97.6 F (36.4 C)-99.7 F (37.6 C)] 98.7 F (37.1 C) (03/13 0400) Pulse Rate:  [80-92] 80 (03/13 0245) Resp:  [14-24] 14 (03/13 0230) BP: (144-154)/(67-87) 154/87 (03/13 0430) SpO2:  [96 %-99 %] 99 % (03/13 0425) FiO2 (%):  [30 %] 30 % (03/13 0425) Weight:  [81.7 kg-84.3 kg] 81.7 kg (03/13 0352)  PHYSICAL EXAMINATION: General: Acutely ill-appearing male, laying in bed, BiPAP, in no acute distress Neuro: Sleeping, arouses to voice, alert and oriented x4, follows commands, no focal deficits, speech clear HEENT: Atraumatic, normocephalic, neck supple, no JVD Cardiovascular: RRR, S1-S2, 2+ pulses Lungs: Clear diminished to auscultation bilaterally, BIPAP assisted, even, nonlabored, no accessory muscle use Abdomen: Soft, nontender, nondistended, no guarding or rebound tenderness, bowel sounds positive x4 Musculoskeletal: Normal bulk and tone, no deformities, no edema Skin: Warm and dry, no obvious rashes lesions or ulcerations  Recent Labs  Lab 02/09/19  0033  NA 142  K 4.4  CL 105  CO2 29  BUN 32*  CREATININE 1.85*  GLUCOSE 135*   Recent Labs  Lab 02/09/19 0033  HGB 10.3*  HCT 33.8*  WBC 5.8  PLT 205   Dg Chest Port 1 View  Result Date: 02/09/2019 CLINICAL DATA:  Dyspnea EXAM: PORTABLE  CHEST 1 VIEW COMPARISON:  01/21/2019 CXR and chest CT FINDINGS: Stable cardiomegaly with mild aortic atherosclerosis. Mild central vascular congestion with bibasilar atelectasis. No significant pleural effusion or pneumothorax. No acute osseous abnormality. Degenerative change noted about the acromioclavicular and glenohumeral joints. IMPRESSION: Cardiomegaly with mild vascular congestion and bibasilar atelectasis. Aortic atherosclerosis without aneurysm. Electronically Signed   By: Ashley Royalty M.D.   On: 02/09/2019 01:27    ASSESSMENT / PLAN:  Acute Hypoxic Respiratory Failure in setting of COPD Exacerbation -Supplemental O2 as needed to maintain O2 sats 88 to 92% -BiPAP, wean as tolerated -Follow intermittent CXR and ABG -DuoNebs, Budesonide nebs, IV Solu-Medrol, Azithromycin -CXR currently without infiltrate -Negative fever curve -Trend WBCs and procalcitonin -Obtain sputum culture -Flu PCR negative -Respiratory Viral PCR pending -Droplet precautions -If respiratory viral panel negative, will send for COVID 19 PCR -Obtain echocardiogram -Check BNP  CKD III, stable (baseline Creatinine 1.9) -Monitor I&O's / urinary output -Follow BMP -Ensure adequate renal perfusion -Avoid nephrotoxic agents as able -Replace electrolytes as indicated  Anemia without signs of active bleeding -Monitor for S/Sx of bleeding -Trend CBC -Lovenox for VTE Prophylaxis  -Transfuse for Hgb <7     DISPOSITION: STEPDOWN GOALS OF CARE: FULL CODE VTE PROPHYLAXIS: LOVENOX UPDATES: UPDATED PT AT BEDSIDE 02/09/19   Darel Hong, AGACNP-BC [facility] Pager: [phone] Cell: [phone]  02/09/2019, 6:09 AM

## 2019-02-09 NOTE — Consult Note (Signed)
NAME: Brett [name]  DOB: [birth year]  MRN: [id number]  Date/Time: 02/09/2019 10:24 AM  REQUESTING PROVIDER : Dewaine Conger Subjective:  REASON FOR CONSULT: To r/o COVID 19 ? Brett [name] is a 84 y.o. male with a history of COPD/Empysema/DM is presenting with worsening sob and cough. Recently in [place] between 2/23-2/25 for CAP-got IV rocephin and zithromax and then sent home on augmentin. HE says he was sent home early as he was still sob. Over the past few days the SOB got worse and his helper asked him to go back to the ED. He lives on his own. He does not have any fever, has some cough, no sputum He has no travel to any place outside [place]. All the visitors who comes to his house have not been sick or as far as he knows not had an outside travel. When EMS arrived he had a pulse ox of 81% RA,  In the ED temp of 97.6, BP 144/71, HR 80, RR 16. He had CXR and  It showed Cardiomegaly with mild vascular congestion and bibasilar atelectasis. HE received a dose of vanco and cefepime.also started on methylprednisone HE was sent to ICU and I am asked to see the patient to r/o COVID 19  Past Medical History:  Diagnosis Date  . Anemia   . Aortic regurgitation   . B-cell lymphoma ([place]) 2009   DX AT [facility]  . Bronchitis   . CAD (coronary artery disease)   . Carotid stenosis   . Diabetes mellitus without complication ([place])   . Diabetes mellitus, type 2 ([place])   . Emphysema of lung ([place])   . Essential hypertension   . History of chemotherapy   . Hyperlipidemia   . Hypertension   . IDA (iron deficiency anemia)   . Leg edema   . Meralgia paraesthetica   . Mitral regurgitation   . PUD (peptic ulcer disease)   . PVD (peripheral vascular disease) ([place])   . Tobacco abuse     Past Surgical History:  Procedure Laterality Date  . CARDIAC CATHETERIZATION  08/15/2007  . CARDIAC CATHETERIZATION Right 07/13/2016   Procedure: Right/Left Heart Cath and Coronary Angiography;  Surgeon: Dionisio David, MD;  Location: [facility];  Service: Cardiovascular;  Laterality: Right;  . COLONOSCOPY  03/2013  . ESOPHAGOGASTRODUODENOSCOPY  03/2013    Social History   Socioeconomic History  . Marital status: Single    Spouse name: Not on file  . Number of children: Not on file  . Years of education: Not on file  . Highest education level: Not on file  Occupational History  . Occupation: retired  Scientific laboratory technician  . Financial resource strain: Not on file  . Food insecurity:    Worry: Not on file    Inability: Not on file  . Transportation needs:    Medical: Not on file    Non-medical: Not on file  Tobacco Use  . Smoking status: Former Smoker    Types: Cigarettes    Last attempt to quit: 08/30/2015    Years since quitting: 3.4  . Smokeless tobacco: Never Used  Substance and Sexual Activity  . Alcohol use: No    Frequency: Never  . Drug use: No  . Sexual activity: Not on file  Lifestyle  . Physical activity:    Days per week: Not on file    Minutes per session: Not on file  . Stress: Not on file  Relationships  . Social connections:  Talks on phone: Not on file    Gets together: Not on file    Attends religious service: Not on file    Active member of club or organization: Not on file    Attends meetings of clubs or organizations: Not on file    Relationship status: Not on file  . Intimate partner violence:    Fear of current or ex partner: Not on file    Emotionally abused: Not on file    Physically abused: Not on file    Forced sexual activity: Not on file  Other Topics Concern  . Not on file  Social History Narrative   Lives in a duplex apartment by himself.  Has a walker to ambulate   -His friends and neighbors check on him        Family History  Problem Relation Age of Onset  . CAD Mother   . Rheum arthritis Neg Hx   . Osteoarthritis Neg Hx   . Asthma Neg Hx   . Diabetes Neg Hx   . Cancer Neg Hx    No Known Allergies  ? Current  Facility-Administered Medications  Medication Dose Route Frequency Provider Last Rate Last Dose  . acetaminophen (TYLENOL) tablet 650 mg  650 mg Oral Q6H PRN Arta Silence, MD       Or  . acetaminophen (TYLENOL) suppository 650 mg  650 mg Rectal Q6H PRN Arta Silence, MD      . amLODipine (NORVASC) tablet 5 mg  5 mg Oral Daily Arta Silence, MD   5 mg at 02/09/19 0430  . bisacodyl (DULCOLAX) EC tablet 5 mg  5 mg Oral Daily PRN Arta Silence, MD      . budesonide (PULMICORT) nebulizer solution 0.5 mg  0.5 mg Nebulization BID Arta Silence, MD   0.5 mg at 02/09/19 0811  . clopidogrel (PLAVIX) tablet 75 mg  75 mg Oral Daily Sridharan, Prasanna, MD      . doxycycline (VIBRA-TABS) tablet 100 mg  100 mg Oral Q12H Merton Border B, MD      . enoxaparin (LOVENOX) injection 40 mg  40 mg Subcutaneous Q24H Arta Silence, MD   40 mg at 02/09/19 0431  . gabapentin (NEURONTIN) capsule 100 mg  100 mg Oral BID Arta Silence, MD      . hydrALAZINE (APRESOLINE) tablet 100 mg  100 mg Oral TID Arta Silence, MD   100 mg at 02/09/19 0430  . insulin aspart (novoLOG) injection 0-15 Units  0-15 Units Subcutaneous TID WC Arta Silence, MD   5 Units at 02/09/19 0841  . insulin aspart (novoLOG) injection 0-5 Units  0-5 Units Subcutaneous QHS Sridharan, Prasanna, MD      . ipratropium-albuterol (DUONEB) 0.5-2.5 (3) MG/3ML nebulizer solution 3 mL  3 mL Nebulization Q6H Wilhelmina Mcardle, MD      . isosorbide mononitrate (IMDUR) 24 hr tablet 30 mg  30 mg Oral Daily Sridharan, Prasanna, MD      . methylPREDNISolone sodium succinate (SOLU-MEDROL) 125 mg/2 mL injection 60 mg  60 mg Intravenous Q12H Arta Silence, MD       Followed by  . [START ON 02/10/2019] predniSONE (DELTASONE) tablet 40 mg  40 mg Oral Q breakfast Arta Silence, MD      . metoprolol succinate (TOPROL-XL) 24 hr tablet 200 mg  200 mg Oral Daily Sridharan, Prasanna, MD      . ondansetron (ZOFRAN)  tablet 4 mg  4 mg Oral Q6H PRN Arta Silence, MD  Or  . ondansetron (ZOFRAN) injection 4 mg  4 mg Intravenous Q6H PRN Arta Silence, MD      . pantoprazole (PROTONIX) EC tablet 40 mg  40 mg Oral Daily Arta Silence, MD      . rosuvastatin (CRESTOR) tablet 40 mg  40 mg Oral q1800 Arta Silence, MD      . senna-docusate (Senokot-S) tablet 1 tablet  1 tablet Oral QHS PRN Arta Silence, MD         Abtx:  Anti-infectives (From admission, onward)   Start     Dose/Rate Route Frequency Ordered Stop   02/09/19 1000  doxycycline (VIBRA-TABS) tablet 100 mg     100 mg Oral Every 12 hours 02/09/19 0925 02/14/19 0959   02/09/19 0300  azithromycin (ZITHROMAX) 500 mg in sodium chloride 0.9 % 250 mL IVPB  Status:  Discontinued     500 mg 250 mL/hr over 60 Minutes Intravenous Every 24 hours 02/09/19 0245 02/09/19 0925   02/09/19 0245  vancomycin (VANCOCIN) 2,000 mg in sodium chloride 0.9 % 500 mL IVPB     2,000 mg 250 mL/hr over 120 Minutes Intravenous  Once 02/09/19 0232 02/09/19 0719   02/09/19 0230  vancomycin (VANCOCIN) IVPB 1000 mg/200 mL premix  Status:  Discontinued     1,000 mg 200 mL/hr over 60 Minutes Intravenous  Once 02/09/19 0218 02/09/19 0232   02/09/19 0230  ceFEPIme (MAXIPIME) 2 g in sodium chloride 0.9 % 100 mL IVPB     2 g 200 mL/hr over 30 Minutes Intravenous  Once 02/09/19 0218 02/09/19 0448      REVIEW OF SYSTEMS:  Const: negative fever, negative chills, negative weight loss Eyes: legally blind ENT: negative coryza, negative sore throat Resp:  Cough,non productive has dyspnea Cards: negative for chest pain, palpitations, lower extremity edema GU: negative for frequency, dysuria and hematuria GI: Negative for abdominal pain, diarrhea, bleeding, constipation Skin: negative for rash and pruritus Heme: negative for easy bruising and gum/nose bleeding MS: negative for myalgias, arthralgias, back pain and muscle weakness Neurolo:negative for  headaches, dizziness, vertigo, memory problems  Psych: negative for feelings of anxiety, depression  Endocrine:no polyuria Allergy/Immunology- negative for any medication or food allergies ?  Objective:  VITALS:  BP 117/63   Pulse 89   Temp (!) 97.1 F (36.2 C) (Axillary)   Resp (!) 23   Ht 5\' 10"  (1.778 m)   Wt 81.7 kg   SpO2 97%   BMI 25.86 kg/m  PHYSICAL EXAM:  General: Alert, cooperative, no distress, appears stated age.  Head: Normocephalic, without obvious abnormality, atraumatic. Eyes: unequal pupil Hard of hearing ENT Nares normal. No drainage or sinus tenderness. Lips, mucosa, and tongue normal. No Thrush Neck: Supple,  Back: No CVA tenderness. Lungs: b/l air entry- rhonchi ++ Heart:s1s2 Abdomen: Soft,  Extremities: atraumatic, no cyanosis. No edema. No clubbing Skin: No rashes or lesions. Or bruising Lymph: Cervical, supraclavicular normal. Neurologic: Grossly non-focal Pertinent Labs Lab Results CBC    Component Value Date/Time   WBC 5.8 02/09/2019 0033   RBC 3.91 (L) 02/09/2019 0033   HGB 10.3 (L) 02/09/2019 0033   HGB 12.5 (L) 05/07/2010 1451   HCT 33.8 (L) 02/09/2019 0033   HCT 36.5 (L) 05/07/2010 1451   PLT 205 02/09/2019 0033   PLT 254 05/07/2010 1451   MCV 86.4 02/09/2019 0033   MCV 93 05/07/2010 1451   MCH 26.3 02/09/2019 0033   MCHC 30.5 02/09/2019 0033   RDW 15.6 (H) 02/09/2019 0033  RDW 12.4 05/07/2010 1451   LYMPHSABS 0.6 (L) 02/09/2019 0033   LYMPHSABS 1.1 05/07/2010 1451   MONOABS 0.6 02/09/2019 0033   EOSABS 0.2 02/09/2019 0033   EOSABS 0.3 05/07/2010 1451   BASOSABS 0.0 02/09/2019 0033   BASOSABS 0.0 05/07/2010 1451    CMP Latest Ref Rng & Units 02/09/2019 01/22/2019 01/21/2019  Glucose 70 - 99 mg/dL 135(H) 324(H) 193(H)  BUN 8 - 23 mg/dL 32(H) 29(H) 20  Creatinine 0.61 - 1.24 mg/dL 1.85(H) 1.94(H) 1.91(H)  Sodium 135 - 145 mmol/L 142 138 141  Potassium 3.5 - 5.1 mmol/L 4.4 4.4 4.2  Chloride 98 - 111 mmol/L 105 101 102  CO2  22 - 32 mmol/L 29 29 30   Calcium 8.9 - 10.3 mg/dL 8.4(L) 8.3(L) 8.9  Total Protein 6.5 - 8.1 g/dL 7.5 - 7.5  Total Bilirubin 0.3 - 1.2 mg/dL 0.5 - 0.9  Alkaline Phos 38 - 126 U/L 78 - 66  AST 15 - 41 U/L 21 - 17  ALT 0 - 44 U/L 18 - 14      Microbiology: No results found for this or any previous visit (from the past 240 hour(s)).  IMAGING RESULTS: 01/21/19     I have personally reviewed the films ? Impression/Recommendation ? Acute on chronic resp failure - due COPD/ Emphysema exacerbation-management as per primary team  No evidence of pneumonia, no risk for COVID 19 Resp viral panel shows RSV which can cause bronchitis/bronchiolitis and which may be contributing to his slow recovery Currently on doxy- may be stopped after 5 days  DM- management as per primary team  ?Anemia stable ? ___________________________________________________ Discussed with patient and his nurse ID will sign off- call if needed

## 2019-02-09 NOTE — ED Notes (Signed)
ED TO INPATIENT HANDOFF REPORT  ED Nurse Name and Phone #: Jenny Reichmann [phone]  S Name/Age/Gender Brett [name] 84 y.o. male Room/Bed: ED11A/ED11A  Code Status   Code Status: Prior  Home/SNF/Other Home Patient oriented to: self, place, time and situation Is this baseline? Yes   Triage Complete: Triage complete  Chief Complaint Breathing Difficulty  Triage Note Pt arrives via acems from home with c/o shortness of breath. Pt states he has had ongoing shortness of breath for 2-3 days. Pt recently diagnosed with pneumonia 3 weeks ago. Pt c/o chest pain associated with shortness of breath. EMS reports that they could not hear breath sounds on pt. Pt oxygen saturation 81% on room air when ems arrived. Pt arrived with CPAP in place. Respiratory at bedside. Pt chronically uses 2L nasal cannula. Pt respirations currently even and labored.    Allergies No Known Allergies  Level of Care/Admitting Diagnosis ED Disposition    ED Disposition Condition [place] [facility]: [place] [[id number]]  Level of Care: Stepdown [14]  Diagnosis: Acute on chronic respiratory failure with hypoxemia [facility]) [[id number]]  Admitting Physician: Arta Silence [[id number]]  Attending Physician: Arta Silence [[id number]]  Estimated length of stay: past midnight tomorrow  Certification:: I certify this patient will need inpatient services for at least 2 midnights  PT Class (Do Not Modify): Inpatient [101]  PT Acc Code (Do Not Modify): Private [1]       B Medical/Surgery History Past Medical History:  Diagnosis Date  . Anemia   . Aortic regurgitation   . B-cell lymphoma ([place]) 2009   DX AT [facility]  . Bronchitis   . CAD (coronary artery disease)   . Carotid stenosis   . Diabetes mellitus without complication ([name])   . Diabetes mellitus, type 2 ([place])   . Emphysema of lung ([place])   . Essential hypertension   . History of chemotherapy   . Hyperlipidemia   .  Hypertension   . IDA (iron deficiency anemia)   . Leg edema   . Meralgia paraesthetica   . Mitral regurgitation   . PUD (peptic ulcer disease)   . PVD (peripheral vascular disease) ([place])   . Tobacco abuse    Past Surgical History:  Procedure Laterality Date  . CARDIAC CATHETERIZATION  08/15/2007  . CARDIAC CATHETERIZATION Right 07/13/2016   Procedure: Right/Left Heart Cath and Coronary Angiography;  Surgeon: Dionisio David, MD;  Location: [place] [facility];  Service: Cardiovascular;  Laterality: Right;  . COLONOSCOPY  03/2013  . ESOPHAGOGASTRODUODENOSCOPY  03/2013     A IV Location/Drains/Wounds Patient Lines/Drains/Airways Status   Active Line/Drains/Airways    Name:   Placement date:   Placement time:   Site:   Days:   Peripheral IV 02/09/19 Right Antecubital   02/09/19    0040    Antecubital   less than 1   Peripheral IV 02/09/19 Left Antecubital   02/09/19    0040    Antecubital   less than 1          Intake/Output Last 24 hours No intake or output data in the 24 hours ending 02/09/19 0300  Labs/Imaging Results for orders placed or performed during the hospital encounter of 02/09/19 (from the past 48 hour(s))  Lactic acid, plasma     Status: None   Collection Time: 02/09/19 12:33 AM  Result Value Ref Range   Lactic Acid, Venous 0.8 0.5 - 1.9 mmol/L    Comment: Performed at [facility]  [facility], [address] [place], [place] [address]  Comprehensive metabolic panel     Status: Abnormal   Collection Time: 02/09/19 12:33 AM  Result Value Ref Range   Sodium 142 135 - 145 mmol/L   Potassium 4.4 3.5 - 5.1 mmol/L   Chloride 105 98 - 111 mmol/L   CO2 29 22 - 32 mmol/L   Glucose, Bld 135 (H) 70 - 99 mg/dL   BUN 32 (H) 8 - 23 mg/dL   Creatinine, Ser 1.85 (H) 0.61 - 1.24 mg/dL   Calcium 8.4 (L) 8.9 - 10.3 mg/dL   Total Protein 7.5 6.5 - 8.1 g/dL   Albumin 3.9 3.5 - 5.0 g/dL   AST 21 15 - 41 U/L   ALT 18 0 - 44 U/L   Alkaline Phosphatase 78 38 - 126 U/L   Total  Bilirubin 0.5 0.3 - 1.2 mg/dL   GFR calc non Af Amer 33 (L) >60 mL/min   GFR calc Af Amer 38 (L) >60 mL/min   Anion gap 8 5 - 15    Comment: Performed at [facility], [address] [place], [place] [address]  CBC WITH DIFFERENTIAL     Status: Abnormal   Collection Time: 02/09/19 12:33 AM  Result Value Ref Range   WBC 5.8 4.0 - 10.5 K/uL   RBC 3.91 (L) 4.22 - 5.81 MIL/uL   Hemoglobin 10.3 (L) 13.0 - 17.0 g/dL   HCT 33.8 (L) 39.0 - 52.0 %   MCV 86.4 80.0 - 100.0 fL   MCH 26.3 26.0 - 34.0 pg   MCHC 30.5 30.0 - 36.0 g/dL   RDW 15.6 (H) 11.5 - 15.5 %   Platelets 205 150 - 400 K/uL   nRBC 0.0 0.0 - 0.2 %   Neutrophils Relative % 75 %   Neutro Abs 4.3 1.7 - 7.7 K/uL   Lymphocytes Relative 10 %   Lymphs Abs 0.6 (L) 0.7 - 4.0 K/uL   Monocytes Relative 11 %   Monocytes Absolute 0.6 0.1 - 1.0 K/uL   Eosinophils Relative 4 %   Eosinophils Absolute 0.2 0.0 - 0.5 K/uL   Basophils Relative 0 %   Basophils Absolute 0.0 0.0 - 0.1 K/uL   Immature Granulocytes 0 %   Abs Immature Granulocytes 0.02 0.00 - 0.07 K/uL    Comment: Performed at [facility], [address] [place], [place] [address]  Blood gas, venous ([place], [facility], [facility])     Status: Abnormal (Preliminary result)   Collection Time: 02/09/19 12:33 AM  Result Value Ref Range   pH, Ven 7.33 7.250 - 7.430   pCO2, Ven 59 44.0 - 60.0 mmHg   pO2, Ven PENDING 32.0 - 45.0 mmHg   Bicarbonate 31.1 (H) 20.0 - 28.0 mmol/L   Acid-Base Excess 3.6 (H) 0.0 - 2.0 mmol/L   O2 Saturation 47.3 %   Patient temperature 37.0    Collection site LINE    Sample type VENOUS     Comment: Performed at [facility], [address] [place], [place] [address]  Influenza panel by PCR (type A & B)     Status: None   Collection Time: 02/09/19 12:34 AM  Result Value Ref Range   Influenza A By PCR NEGATIVE NEGATIVE   Influenza B By PCR NEGATIVE NEGATIVE    Comment: (NOTE) The Xpert Xpress Flu assay is intended as an aid in the  diagnosis of  influenza and should not be used as a sole basis for  treatment.  This  assay is FDA approved for nasopharyngeal swab specimens only. Nasal  washings and aspirates are unacceptable for Xpert Xpress Flu testing. Performed at [facility], [address] [place], [place] [address]    Dg Chest Port 1 View  Result Date: 02/09/2019 CLINICAL DATA:  Dyspnea EXAM: PORTABLE CHEST 1 VIEW COMPARISON:  01/21/2019 CXR and chest CT FINDINGS: Stable cardiomegaly with mild aortic atherosclerosis. Mild central vascular congestion with bibasilar atelectasis. No significant pleural effusion or pneumothorax. No acute osseous abnormality. Degenerative change noted about the acromioclavicular and glenohumeral joints. IMPRESSION: Cardiomegaly with mild vascular congestion and bibasilar atelectasis. Aortic atherosclerosis without aneurysm. Electronically Signed   By: Ashley Royalty M.D.   On: 02/09/2019 01:27    Pending Labs Unresulted Labs (From admission, onward)    Start     Ordered   02/09/19 0248  Magnesium  Add-on,   AD    Question:  Specimen collection method  Answer:  Unit=Unit collect   02/09/19 0247   02/09/19 0248  Phosphorus  Add-on,   AD    Question:  Specimen collection method  Answer:  Unit=Unit collect   02/09/19 0247   02/09/19 0248  Calcium, ionized  Once,   STAT    Question:  Specimen collection method  Answer:  Unit=Unit collect   02/09/19 0247   02/09/19 0219  Respiratory Panel by PCR  (Respiratory virus panel with precautions)  Once,   STAT     02/09/19 0218   02/09/19 0033  Lactic acid, plasma  STAT Now then every 3 hours,   STAT     02/09/19 0033   02/09/19 0033  Urinalysis, Complete w Microscopic  ONCE - STAT,   STAT     02/09/19 0033          Vitals/Pain Today's Vitals   02/09/19 0052 02/09/19 0200 02/09/19 0230 02/09/19 0245  BP:  (!) 144/67    Pulse:  81 92 80  Resp:  (!) 24 14   Temp: 99.7 F (37.6 C)     TempSrc: Rectal     SpO2:  96% 96% 99%   Weight:      Height:      PainSc:        Isolation Precautions Droplet precaution  Medications Medications  ceFEPIme (MAXIPIME) 2 g in sodium chloride 0.9 % 100 mL IVPB (has no administration in time range)  vancomycin (VANCOCIN) 2,000 mg in sodium chloride 0.9 % 500 mL IVPB (has no administration in time range)  azithromycin (ZITHROMAX) 500 mg in sodium chloride 0.9 % 250 mL IVPB (has no administration in time range)  methylPREDNISolone sodium succinate (SOLU-MEDROL) 125 mg/2 mL injection 60 mg (has no administration in time range)    Followed by  predniSONE (DELTASONE) tablet 40 mg (has no administration in time range)  ipratropium-albuterol (DUONEB) 0.5-2.5 (3) MG/3ML nebulizer solution 3 mL (has no administration in time range)  budesonide (PULMICORT) nebulizer solution 0.5 mg (has no administration in time range)  ipratropium-albuterol (DUONEB) 0.5-2.5 (3) MG/3ML nebulizer solution 3 mL ( Nebulization Not Given 02/09/19 0127)  ipratropium-albuterol (DUONEB) 0.5-2.5 (3) MG/3ML nebulizer solution 3 mL (3 mLs Nebulization Given 02/09/19 0127)  methylPREDNISolone sodium succinate (SOLU-MEDROL) 125 mg/2 mL injection 125 mg (125 mg Intravenous Given 02/09/19 0256)  furosemide (LASIX) injection 40 mg (40 mg Intravenous Given 02/09/19 0255)    Mobility walks Moderate fall risk   Focused Assessments Pulmonary Assessment Handoff:  Lung sounds:   O2 Device: Bi-PAP  R Recommendations: See Admitting Provider Note  Report given to:   Additional Notes: none

## 2019-02-10 ENCOUNTER — Inpatient Hospital Stay: Payer: Medicare PPO

## 2019-02-10 LAB — CBC
HCT: 30.4 % — ABNORMAL LOW (ref 39.0–52.0)
Hemoglobin: 9.2 g/dL — ABNORMAL LOW (ref 13.0–17.0)
MCH: 25.8 pg — ABNORMAL LOW (ref 26.0–34.0)
MCHC: 30.3 g/dL (ref 30.0–36.0)
MCV: 85.2 fL (ref 80.0–100.0)
PLATELETS: 190 10*3/uL (ref 150–400)
RBC: 3.57 MIL/uL — ABNORMAL LOW (ref 4.22–5.81)
RDW: 15.6 % — ABNORMAL HIGH (ref 11.5–15.5)
WBC: 8.5 10*3/uL (ref 4.0–10.5)
nRBC: 0 % (ref 0.0–0.2)

## 2019-02-10 LAB — BASIC METABOLIC PANEL
Anion gap: 10 (ref 5–15)
BUN: 56 mg/dL — ABNORMAL HIGH (ref 8–23)
CO2: 27 mmol/L (ref 22–32)
Calcium: 8.1 mg/dL — ABNORMAL LOW (ref 8.9–10.3)
Chloride: 101 mmol/L (ref 98–111)
Creatinine, Ser: 2.11 mg/dL — ABNORMAL HIGH (ref 0.61–1.24)
GFR calc Af Amer: 33 mL/min — ABNORMAL LOW (ref >60)
GFR calc non Af Amer: 28 mL/min — ABNORMAL LOW (ref >60)
GLUCOSE: 260 mg/dL — AB (ref 70–99)
Potassium: 4.5 mmol/L (ref 3.5–5.1)
Sodium: 138 mmol/L (ref 135–145)

## 2019-02-10 LAB — GLUCOSE, CAPILLARY
Glucose-Capillary: 254 mg/dL — ABNORMAL HIGH (ref 70–99)
Glucose-Capillary: 251 mg/dL — ABNORMAL HIGH (ref 70–99)
Glucose-Capillary: 322 mg/dL — ABNORMAL HIGH (ref 70–99)
Glucose-Capillary: 259 mg/dL — ABNORMAL HIGH (ref 70–99)

## 2019-02-10 LAB — CALCIUM, IONIZED: Calcium, Ionized, Serum: 4.4 mg/dL — ABNORMAL LOW (ref 4.5–5.6)

## 2019-02-10 MED ORDER — ENOXAPARIN SODIUM 30 MG/0.3ML ~~LOC~~ SOLN
30.0000 mg | SUBCUTANEOUS | Status: DC
  Administered 2019-02-11 – 2019-02-12 (×2): 30 mg via SUBCUTANEOUS
  Filled 2019-02-10 (×2): qty 0.3

## 2019-02-10 MED ORDER — ORAL CARE MOUTH RINSE
15.0000 mL | Freq: Two times a day (BID) | OROMUCOSAL | Status: DC
  Administered 2019-02-10 – 2019-02-12 (×4): 15 mL via OROMUCOSAL

## 2019-02-10 MED ORDER — GUAIFENESIN-DM 100-10 MG/5ML PO SYRP
5.0000 mL | ORAL_SOLUTION | ORAL | Status: DC | PRN
  Administered 2019-02-11: 5 mL via ORAL
  Filled 2019-02-10: qty 5

## 2019-02-10 MED ORDER — SODIUM CHLORIDE 0.9% FLUSH
3.0000 mL | Freq: Two times a day (BID) | INTRAVENOUS | Status: DC
  Administered 2019-02-10 – 2019-02-12 (×7): 3 mL via INTRAVENOUS

## 2019-02-10 NOTE — Progress Notes (Signed)
[place] [facility] [place] NAME: Brett [name]    MR#:  [id number]  DATE OF BIRTH:  11-Jul-1935  SUBJECTIVE:   Patient presented to the hospital due to shortness of breath and respiratory distress secondary to COPD exacerbation.  His respiratory panel for RSV.  Patient's shortness of breath and wheezing has improved since yesterday.  REVIEW OF SYSTEMS:    Review of Systems  Constitutional: Negative for chills and fever.  HENT: Negative for congestion and tinnitus.   Eyes: Negative for blurred vision and double vision.  Respiratory: Positive for cough and shortness of breath. Negative for wheezing.   Cardiovascular: Negative for chest pain, orthopnea and PND.  Gastrointestinal: Negative for abdominal pain, diarrhea, nausea and vomiting.  Genitourinary: Negative for dysuria and hematuria.  Neurological: Positive for weakness (generalized). Negative for dizziness, sensory change and focal weakness.  All other systems reviewed and are negative.   Nutrition: Heart Healthy/Carb control Tolerating Diet: Yes Tolerating PT: Await Eval.    DRUG ALLERGIES:  No Known Allergies  VITALS:  Blood pressure 130/60, pulse 67, temperature (!) 97.5 F (36.4 C), temperature source Oral, resp. rate 18, height 5\' 10"  (1.778 m), weight 81.7 kg, SpO2 96 %.  PHYSICAL EXAMINATION:   Physical Exam  GENERAL:  84 y.o.-year-old patient sitting up in bed eating breakfast in no apparent distress. EYES: Pupils equal, round, reactive to light and accommodation. No scleral icterus. Extraocular muscles intact.  HEENT: Head atraumatic, normocephalic. Oropharynx and nasopharynx clear.  NECK:  Supple, no jugular venous distention. No thyroid enlargement, no tenderness.  LUNGS: Good air entry bilaterally, minimal end expiratory wheezing and rhonchi bilaterally.  No rales.  Negative use accessory muscles. CARDIOVASCULAR: S1, S2 normal. No murmurs, rubs, or gallops.   ABDOMEN: Soft, nontender, nondistended. Bowel sounds present. No organomegaly or mass.  EXTREMITIES: No cyanosis, clubbing or edema b/l.    NEUROLOGIC: Cranial nerves II through XII are intact. No focal Motor or sensory deficits b/l. Globally weak.   PSYCHIATRIC: The patient is alert and oriented x 3.  SKIN: No obvious rash, lesion, or ulcer.    LABORATORY PANEL:   CBC Recent Labs  Lab 02/10/19 0511  WBC 8.5  HGB 9.2*  HCT 30.4*  PLT 190   ------------------------------------------------------------------------------------------------------------------  Chemistries  Recent Labs  Lab 02/09/19 0033 02/09/19 0435 02/10/19 0511  NA 142  --  138  K 4.4  --  4.5  CL 105  --  101  CO2 29  --  27  GLUCOSE 135*  --  260*  BUN 32*  --  56*  CREATININE 1.85*  --  2.11*  CALCIUM 8.4*  --  8.1*  MG  --  2.1  --   AST 21  --   --   ALT 18  --   --   ALKPHOS 78  --   --   BILITOT 0.5  --   --    ------------------------------------------------------------------------------------------------------------------  Cardiac Enzymes No results for input(s): TROPONINI in the last 168 hours. ------------------------------------------------------------------------------------------------------------------  RADIOLOGY:  Dg Chest Port 1 View  Result Date: 02/10/2019 CLINICAL DATA:  Follow-up examination for respiratory failure. EXAM: PORTABLE CHEST 1 VIEW COMPARISON:  Prior radiograph from 02/09/2019 FINDINGS: Cardiomegaly, stable. Mediastinal silhouette within normal limits. Aortic atherosclerosis. Lungs normally inflated. Mild pulmonary venous congestion, slightly worsened as compared to previous exam. Superimposed bibasilar atelectasis, similar. No significant pleural effusion. No consolidative opacity. No pneumothorax. No acute osseous finding. IMPRESSION: 1. Cardiomegaly with  mild diffuse pulmonary vascular congestion, mildly increased as compared to 02/09/2019. 2. Aortic atherosclerosis.  Electronically Signed   By: Jeannine Boga M.D.   On: 02/10/2019 05:19   Dg Chest Port 1 View  Result Date: 02/09/2019 CLINICAL DATA:  Dyspnea EXAM: PORTABLE CHEST 1 VIEW COMPARISON:  01/21/2019 CXR and chest CT FINDINGS: Stable cardiomegaly with mild aortic atherosclerosis. Mild central vascular congestion with bibasilar atelectasis. No significant pleural effusion or pneumothorax. No acute osseous abnormality. Degenerative change noted about the acromioclavicular and glenohumeral joints. IMPRESSION: Cardiomegaly with mild vascular congestion and bibasilar atelectasis. Aortic atherosclerosis without aneurysm. Electronically Signed   By: Ashley Royalty M.D.   On: 02/09/2019 01:27     ASSESSMENT AND PLAN:   84 year old male with past medical history of COPD, diabetes, hypertension, peripheral vascular disease, iron deficiency anemia, history of coronary artery disease who presented to the hospital due to shortness of breath and noted to be in acute respiratory failure with hypoxia secondary to COPD exacerbation.  1.  Acute on chronic respiratory failure with hypoxia-secondary to COPD exacerbation. - Initially patient was admitted to stepdown level of care and was on BiPAP but now has been weaned off of it. -Currently on nasal cannula oxygen at 3 L and doing well.  Continue treatment for underlying COPD with IV steroids, scheduled duo nebs, Pulmicort nebs.  Chest x-ray on admission showed no acute pneumonia.  2.  COPD exacerbation-source of patient's worsening respiratory failure with hypoxemia. - Continue IV steroids, scheduled duo nebs, Pulmicort nebs.  This is likely secondary to patient's RSV.  Patient's PCR was positive.  Continue supportive care.  Patient is already on oxygen at home.  3. Essential hypertension-continue hydralazine, Norvasc, Imdur, Toprol.  4.  Diabetes type 2 without complication- blood sugars are somewhat labile due to patient being on IV steroids. -Continue sliding  scale insulin for now.  5.  GERD-continue Protonix.  6.  Hyperlipidemia-continue Crestor.   Await physical therapy evaluation and likely discharge in next 24 to 48 hours.  All the records are reviewed and case discussed with Care Management/Social Worker. Management plans discussed with the patient, family and they are in agreement.  CODE STATUS: Full code  DVT Prophylaxis: Lovenox  TOTAL TIME TAKING CARE OF THIS PATIENT: 30 minutes.   POSSIBLE D/C IN 1-2 DAYS, DEPENDING ON CLINICAL CONDITION.   Henreitta Leber M.D on 02/10/2019 at 12:17 PM  Between 7am to 6pm - Pager - [phone]  After 6pm go to [email or web address] - Proofreader  [employer]  Hospitalists  Office  [phone]  CC: Primary care physician; Perrin Maltese, MD

## 2019-02-10 NOTE — Progress Notes (Addendum)
Pt denies co's; very HOH with impaired vision. Caregiver spoke with pt on phone. 02 weaned to  2l/[place]. Ambulated around NS and back to room and tolerated well. Napped at intervals.

## 2019-02-10 NOTE — Progress Notes (Signed)
PT Cancellation Note  Patient Details Name: Brett [name] MRN: [id number] DOB: [birth year]   Cancelled Treatment:    Reason Eval/Treat Not Completed: Fatigue/lethargy limiting ability to participate.  Pt could not keep eyes open so will try again at another time.   Ramond Dial 02/10/2019, 2:38 PM  Mee Hives, PT MS Acute Rehab Dept. Number: [place] and [place]

## 2019-02-10 NOTE — Progress Notes (Signed)
PHARMACIST - PHYSICIAN COMMUNICATION  CONCERNING:  Enoxaparin (Lovenox) for DVT Prophylaxis    RECOMMENDATION: Patient was prescribed enoxaprin 40mg  q24 hours for VTE prophylaxis.   Filed Weights   02/09/19 0037 02/09/19 0352  Weight: 185 lb 12.8 oz (84.3 kg) 180 lb 3.3 oz (81.7 kg)    Body mass index is 25.86 kg/m.  Estimated Creatinine Clearance: 27.9 mL/min (A) (by C-G formula based on SCr of 2.11 mg/dL (H)).   Patient is candidate for enoxaparin 30mg  every 24 hours based on CrCl <13ml/min or Weight less then 45kg for male and 50kg for male  DESCRIPTION: Pharmacy has adjusted enoxaparin dose per [facility] policy.  Patient is now receiving enoxaparin 30mg  every 24 hours.  Dallie Piles, PharmD Clinical Pharmacist  02/10/2019 9:47 AM

## 2019-02-11 DIAGNOSIS — R079 Chest pain, unspecified: Secondary | ICD-10-CM

## 2019-02-11 LAB — BASIC METABOLIC PANEL
Anion gap: 9 (ref 5–15)
BUN: 74 mg/dL — ABNORMAL HIGH (ref 8–23)
CO2: 28 mmol/L (ref 22–32)
CREATININE: 2.31 mg/dL — AB (ref 0.61–1.24)
Calcium: 8.2 mg/dL — ABNORMAL LOW (ref 8.9–10.3)
Chloride: 101 mmol/L (ref 98–111)
GFR calc Af Amer: 29 mL/min — ABNORMAL LOW (ref >60)
GFR calc non Af Amer: 25 mL/min — ABNORMAL LOW (ref >60)
GLUCOSE: 242 mg/dL — AB (ref 70–99)
Potassium: 4.3 mmol/L (ref 3.5–5.1)
Sodium: 138 mmol/L (ref 135–145)

## 2019-02-11 LAB — TROPONIN I: TROPONIN I: 0.03 ng/mL — AB (ref <0.03)

## 2019-02-11 LAB — GLUCOSE, CAPILLARY
GLUCOSE-CAPILLARY: 248 mg/dL — AB (ref 70–99)
Glucose-Capillary: 273 mg/dL — ABNORMAL HIGH (ref 70–99)
Glucose-Capillary: 314 mg/dL — ABNORMAL HIGH (ref 70–99)
Glucose-Capillary: 302 mg/dL — ABNORMAL HIGH (ref 70–99)

## 2019-02-11 MED ORDER — PREDNISONE 20 MG PO TABS: 40.0000 mg | ORAL_TABLET | Freq: Every day | ORAL | Status: DC

## 2019-02-11 MED ORDER — PREDNISONE 10 MG PO TABS: ORAL_TABLET | ORAL | 0 refills | Status: DC

## 2019-02-11 MED ORDER — BISACODYL 5 MG PO TBEC
5.0000 mg | DELAYED_RELEASE_TABLET | Freq: Two times a day (BID) | ORAL | Status: DC | PRN
  Administered 2019-02-11: 5 mg via ORAL
  Filled 2019-02-11: qty 1

## 2019-02-11 MED ORDER — BISACODYL 10 MG RE SUPP
10.0000 mg | Freq: Once | RECTAL | Status: DC
  Filled 2019-02-11: qty 1

## 2019-02-11 MED ORDER — LIDOCAINE VISCOUS HCL 2 % MT SOLN
15.0000 mL | Freq: Four times a day (QID) | OROMUCOSAL | Status: DC | PRN
  Administered 2019-02-11: 02:00:00 15 mL via OROMUCOSAL
  Filled 2019-02-11 (×3): qty 15

## 2019-02-11 MED ORDER — ALUM & MAG HYDROXIDE-SIMETH 200-200-20 MG/5ML PO SUSP
30.0000 mL | Freq: Four times a day (QID) | ORAL | Status: DC | PRN
  Filled 2019-02-11: qty 30

## 2019-02-11 MED ORDER — DOXYCYCLINE HYCLATE 100 MG PO TABS: 100.0000 mg | ORAL_TABLET | Freq: Two times a day (BID) | ORAL | 0 refills | Status: DC

## 2019-02-11 MED ORDER — METHYLPREDNISOLONE SODIUM SUCC 125 MG IJ SOLR
60.0000 mg | Freq: Two times a day (BID) | INTRAMUSCULAR | Status: AC
  Administered 2019-02-11 – 2019-02-12 (×3): 60 mg via INTRAVENOUS
  Filled 2019-02-11 (×3): qty 2

## 2019-02-11 MED ORDER — IPRATROPIUM-ALBUTEROL 0.5-2.5 (3) MG/3ML IN SOLN
RESPIRATORY_TRACT | Status: AC
  Filled 2019-02-11: qty 3

## 2019-02-11 MED ORDER — MORPHINE SULFATE (PF) 2 MG/ML IV SOLN
2.0000 mg | INTRAVENOUS | Status: DC | PRN
  Filled 2019-02-11: qty 1

## 2019-02-11 MED ORDER — ALUM & MAG HYDROXIDE-SIMETH 200-200-20 MG/5ML PO SUSP
30.0000 mL | Freq: Four times a day (QID) | ORAL | Status: DC | PRN
  Administered 2019-02-11: 30 mL via ORAL

## 2019-02-11 MED ORDER — METHYLPREDNISOLONE SODIUM SUCC 125 MG IJ SOLR
60.0000 mg | Freq: Once | INTRAMUSCULAR | Status: AC
  Administered 2019-02-11: 02:00:00 60 mg via INTRAVENOUS
  Filled 2019-02-11: qty 2

## 2019-02-11 MED ORDER — LIDOCAINE VISCOUS HCL 2 % MT SOLN
15.0000 mL | Freq: Once | OROMUCOSAL | Status: DC
  Filled 2019-02-11: qty 15

## 2019-02-11 MED ORDER — IPRATROPIUM-ALBUTEROL 0.5-2.5 (3) MG/3ML IN SOLN
3.0000 mL | RESPIRATORY_TRACT | Status: DC
  Administered 2019-02-11 – 2019-02-12 (×6): 3 mL via RESPIRATORY_TRACT
  Filled 2019-02-11 (×5): qty 3

## 2019-02-11 MED ORDER — IPRATROPIUM-ALBUTEROL 0.5-2.5 (3) MG/3ML IN SOLN: 3.0000 mL | Freq: Four times a day (QID) | RESPIRATORY_TRACT | 0 refills | Status: DC | PRN

## 2019-02-11 NOTE — Progress Notes (Signed)
Pt ambulating in hall with PT with 02- O2 check done with pulse ox 87-88% on 2l/Brett [name] with pt returned to room.  DOE with wheezing without acute distress but does desat.

## 2019-02-11 NOTE — Progress Notes (Signed)
MD notified of pt c/o "chest pain". Orders for troponin level x 1, EKG stat, and Continuous tele monitoring. Upon reassessing pt lung sounds (bilatereal wheezes), the pt requested something for constipation because the pain is pressing against his upper abdomen. I asked the patient to point to the area where he is having pain and he pointed to his upper abdomen/epigastric area. When I pressed on this area he stated it was very painful. Dr. Sidney Ace notified and I received orders for PRN GI cocktail. MD notified of positive troponins.

## 2019-02-11 NOTE — Progress Notes (Signed)
Pt reports upper abdominal soreness/ mild tenderness in epigastric-reports he feels it is related to constipation with no stool for at least 2 days. Senna given upon his request. MD notified. Further intervention not needed with pt having BM in BR.

## 2019-02-11 NOTE — Evaluation (Signed)
Physical Therapy Evaluation Patient Details Name: Brett [name] MRN: [id number] DOB: 1934/12/28 Today's Date: 02/11/2019   History of Present Illness  84 yo male with onset of acute respiratory failure and with exacerbation of COPD on 2L O2 at home was admitted for management of SOB, hypoxia and placed on Bipap.  Pt is both visually impaired and has limited hearing.  PMHx;  anemia, aortic regurg, bronchitis, b cell lymphoma, DM, CAD, HTN, chemotherapy, PVD, PUD, leg edema,   Clinical Impression  Pt is today demonstrating some difficulty with movment as his sats dropped to 86% with movment, but recovered to 100% after returning to his room.  Pt is on 2L and will need to continue this support for his comfort and health, and will follow acutely to work on strength and balance as tolerated.    Follow Up Recommendations Home health PT;Supervision for mobility/OOB    Equipment Recommendations  None recommended by PT    Recommendations for Other Services       Precautions / Restrictions Precautions Precautions: Fall(telemetry) Precaution Comments: monitor O2 sats Restrictions Weight Bearing Restrictions: No      Mobility  Bed Mobility Overal bed mobility: Needs Assistance Bed Mobility: Supine to Sit;Sit to Supine     Supine to sit: Min assist Sit to supine: Min assist   General bed mobility comments: minor help in and out of bed  Transfers Overall transfer level: Needs assistance Equipment used: 1 person hand held assist Transfers: Sit to/from Stand Sit to Stand: Min guard         General transfer comment: min guard for safety due to vision  Ambulation/Gait Ambulation/Gait assistance: Min guard Gait Distance (Feet): 150 Feet Assistive device: 1 person hand held assist Gait Pattern/deviations: Step-through pattern;Decreased stride length;Wide base of support Gait velocity: reduced Gait velocity interpretation: <1.31 ft/sec, indicative of household ambulator General  Gait Details: verbally cued to use wall or obstacles as guides, pt is doing well despite deficits  Stairs            Wheelchair Mobility    Modified Rankin (Stroke Patients Only)       Balance                                             Pertinent Vitals/Pain Pain Assessment: No/denies pain    Home Living Family/patient expects to be discharged to:: Private residence Living Arrangements: Alone Available Help at Discharge: Neighbor;Friend(s) Type of Home: House Home Access: Stairs to enter Entrance Stairs-Rails: Left Entrance Stairs-Number of Steps: 3 STE Home Layout: One level Home Equipment: Walker - 2 wheels;Cane - single point;Toilet riser      Prior Function Level of Independence: Independent         Comments: Indep without assist device for basic transfers and gait; denies fall history.  Has access to [facility] and RW if needed, but does "not want to rely on that".  Home O2 at 3L     Hand Dominance   Dominant Hand: Right    Extremity/Trunk Assessment   Upper Extremity Assessment Upper Extremity Assessment: Overall WFL for tasks assessed    Lower Extremity Assessment Lower Extremity Assessment: Generalized weakness    Cervical / Trunk Assessment Cervical / Trunk Assessment: Normal  Communication   Communication: HOH(best to speak in L ear)  Cognition Arousal/Alertness: Awake/alert Behavior During Therapy: WFL for tasks assessed/performed Overall Cognitive  Status: Within Functional Limits for tasks assessed                                        General Comments      Exercises     Assessment/Plan    PT Assessment Patient needs continued PT services  PT Problem List Decreased strength;Decreased range of motion;Decreased activity tolerance;Decreased balance;Decreased mobility;Decreased coordination;Decreased knowledge of use of DME;Decreased safety awareness;Cardiopulmonary status limiting activity       PT  Treatment Interventions DME instruction;Gait training;Stair training;Functional mobility training;Therapeutic activities;Balance training;Therapeutic exercise;Neuromuscular re-education;Patient/family education    PT Goals (Current goals can be found in the Care Plan section)  Acute Rehab PT Goals Patient Stated Goal: to get home and feel better PT Goal Formulation: With patient Time For Goal Achievement: 02/25/19 Potential to Achieve Goals: Good    Frequency Min 2X/week   Barriers to discharge Inaccessible home environment home alone with stairs to enter    Co-evaluation               AM-PAC PT "6 Clicks" Mobility  Outcome Measure Help needed turning from your back to your side while in a flat bed without using bedrails?: None Help needed moving from lying on your back to sitting on the side of a flat bed without using bedrails?: None Help needed moving to and from a bed to a chair (including a wheelchair)?: A Little Help needed standing up from a chair using your arms (e.g., wheelchair or bedside chair)?: A Little Help needed to walk in hospital room?: A Little Help needed climbing 3-5 steps with a railing? : A Lot 6 Click Score: 19    End of Session Equipment Utilized During Treatment: Gait belt;Oxygen Activity Tolerance: Patient tolerated treatment well;Patient limited by fatigue Patient left: in bed;with call bell/phone within reach;with bed alarm set Nurse Communication: Mobility status PT Visit Diagnosis: Unsteadiness on feet (R26.81);Muscle weakness (generalized) (M62.81);Difficulty in walking, not elsewhere classified (R26.2)    Time: 4742-5956 PT Time Calculation (min) (ACUTE ONLY): 16 min   Charges:   PT Evaluation $PT Eval Moderate Complexity: 1 Mod         Ramond Dial 02/11/2019, 11:44 PM  Mee Hives, PT MS Acute Rehab Dept. Number: [place] and [place]

## 2019-02-11 NOTE — TOC Progression Note (Addendum)
Transition of Care [facility]) - Progression Note    Patient Details  Name: Brett [name] MRN: [id number] Date of Birth: [birth year]  Transition of Care [facility]) CM/SW Kent Curtis Cain, RN Phone Number: 02/11/2019, 8:12 AM  Clinical Narrative:   RNCM consulted on patient to assist with transition of care needs. Patient is very hard of hearing and partially blind. Patient lives alone but has a neighbor who comes in daily and assist with things around the house and transportation. Patient uses a cane and also has a walker in the home. Patient has used home health in the past. CMS Medicare.gov Compare Post Acute Care list reviewed with patient and he prefers to use [facility]. Heads up to [place] at [place]. Chronic oxygen already in place, DME orders for nebulizer, notified Brad from [facility] for delivery. PCP is [name]. Uses [facility] and obtains medications without issues.     Expected Discharge Plan: [place] Barriers to Discharge: No Barriers Identified  Expected Discharge Plan and Services Expected Discharge Plan: [place] Discharge Planning Services: CM Consult Post Acute Care Choice: [place] arrangements for the past 2 months: Single Family Home                           Social Determinants of Health (SDOH) Interventions    Readmission Risk Interventions 30 Day Unplanned Readmission Risk Score     ED to Hosp-Admission (Current) from 02/09/2019 in [place] (1C)  30 Day Unplanned Readmission Risk Score (%)  37 Filed at 02/11/2019 0801     This score is the patient's risk of an unplanned readmission within 30 days of being discharged (0 -100%). The score is based on dignosis, age, lab data, medications, orders, and past utilization.   Low:  0-14.9   Medium: 15-21.9   High: 22-29.9   Extreme: 30 and above       No flowsheet data found.

## 2019-02-11 NOTE — Progress Notes (Signed)
[place] [facility] [place] NAME: Brett [name]    MR#:  [id number]  DATE OF BIRTH:  03/02/1935  SUBJECTIVE:   Pt. ambulated today and was complaining of significant exertional dyspnea and wheezing became somewhat hypoxic on ambulation and therefore hold off on discharge today.  REVIEW OF SYSTEMS:    Review of Systems  Constitutional: Negative for chills and fever.  HENT: Negative for congestion and tinnitus.   Eyes: Negative for blurred vision and double vision.  Respiratory: Positive for cough and shortness of breath. Negative for wheezing.   Cardiovascular: Negative for chest pain, orthopnea and PND.  Gastrointestinal: Negative for abdominal pain, diarrhea, nausea and vomiting.  Genitourinary: Negative for dysuria and hematuria.  Neurological: Positive for weakness (generalized). Negative for dizziness, sensory change and focal weakness.  All other systems reviewed and are negative.   Nutrition: Heart Healthy/Carb control Tolerating Diet: Yes Tolerating PT: Await Eval.    DRUG ALLERGIES:  No Known Allergies  VITALS:  Blood pressure 138/62, pulse 100, temperature 97.7 F (36.5 C), temperature source Oral, resp. rate 20, height 5\' 10"  (1.778 m), weight 81.7 kg, SpO2 92 %.  PHYSICAL EXAMINATION:   Physical Exam  GENERAL:  84 y.o.-year-old patient  Lying in bed in no apparent distress and very hard of hearing.  EYES: Pupils equal, round, reactive to light and accommodation. No scleral icterus. Extraocular muscles intact.  HEENT: Head atraumatic, normocephalic. Oropharynx and nasopharynx clear.  NECK:  Supple, no jugular venous distention. No thyroid enlargement, no tenderness.  LUNGS: Good air entry bilaterally, minimal end expiratory wheezing and rhonchi bilaterally.  No rales.  Negative use accessory muscles. CARDIOVASCULAR: S1, S2 normal. No murmurs, rubs, or gallops.  ABDOMEN: Soft, nontender, nondistended. Bowel sounds  present. No organomegaly or mass.  EXTREMITIES: No cyanosis, clubbing or edema b/l.    NEUROLOGIC: Cranial nerves II through XII are intact. No focal Motor or sensory deficits b/l. Globally weak.   PSYCHIATRIC: The patient is alert and oriented x 3.  SKIN: No obvious rash, lesion, or ulcer.    LABORATORY PANEL:   CBC Recent Labs  Lab 02/10/19 0511  WBC 8.5  HGB 9.2*  HCT 30.4*  PLT 190   ------------------------------------------------------------------------------------------------------------------  Chemistries  Recent Labs  Lab 02/09/19 0033 02/09/19 0435  02/11/19 0420  NA 142  --    < > 138  K 4.4  --    < > 4.3  CL 105  --    < > 101  CO2 29  --    < > 28  GLUCOSE 135*  --    < > 242*  BUN 32*  --    < > 74*  CREATININE 1.85*  --    < > 2.31*  CALCIUM 8.4*  --    < > 8.2*  MG  --  2.1  --   --   AST 21  --   --   --   ALT 18  --   --   --   ALKPHOS 78  --   --   --   BILITOT 0.5  --   --   --    < > = values in this interval not displayed.   ------------------------------------------------------------------------------------------------------------------  Cardiac Enzymes Recent Labs  Lab 02/11/19 0158  TROPONINI 0.03*   ------------------------------------------------------------------------------------------------------------------  RADIOLOGY:  Dg Chest Port 1 View  Result Date: 02/10/2019 CLINICAL DATA:  Follow-up examination for respiratory failure. EXAM: PORTABLE CHEST  1 VIEW COMPARISON:  Prior radiograph from 02/09/2019 FINDINGS: Cardiomegaly, stable. Mediastinal silhouette within normal limits. Aortic atherosclerosis. Lungs normally inflated. Mild pulmonary venous congestion, slightly worsened as compared to previous exam. Superimposed bibasilar atelectasis, similar. No significant pleural effusion. No consolidative opacity. No pneumothorax. No acute osseous finding. IMPRESSION: 1. Cardiomegaly with mild diffuse pulmonary vascular congestion, mildly  increased as compared to 02/09/2019. 2. Aortic atherosclerosis. Electronically Signed   By: Jeannine Boga M.D.   On: 02/10/2019 05:19     ASSESSMENT AND PLAN:   84 year old male with past medical history of COPD, diabetes, hypertension, peripheral vascular disease, iron deficiency anemia, history of coronary artery disease who presented to the hospital due to shortness of breath and noted to be in acute respiratory failure with hypoxia secondary to COPD exacerbation.  1.  Acute on chronic respiratory failure with hypoxia-secondary to COPD exacerbation. - Initially patient was admitted to stepdown level of care and was on BiPAP but now has been weaned off of it. -Currently on nasal cannula oxygen at 3 L and doing well.  Continue treatment for underlying COPD with IV steroids, scheduled duo nebs, Pulmicort nebs and slow to improve.   2.  COPD exacerbation-source of patient's worsening respiratory failure with hypoxemia. - Continue IV steroids, scheduled duo nebs, Pulmicort nebs.  This is likely secondary to patient's RSV.  Patient's PCR was positive.  Continue supportive care.  Patient is already on oxygen at home. - slow to improve and still having exertional dyspnea.  3. Essential hypertension-continue hydralazine, Norvasc, Imdur, Toprol.  4.  Diabetes type 2 without complication- blood sugars are somewhat labile due to patient being on IV steroids. -Continue sliding scale insulin for now.  5.  GERD-continue Protonix.  6.  Hyperlipidemia-continue Crestor.  Likely discharge home with home health in the next 24 to 48 hours.    All the records are reviewed and case discussed with Care Management/Social Worker. Management plans discussed with the patient, family and they are in agreement.  CODE STATUS: Full code  DVT Prophylaxis: Lovenox  TOTAL TIME TAKING CARE OF THIS PATIENT: 30 minutes.   POSSIBLE D/C IN 1-2 DAYS, DEPENDING ON CLINICAL CONDITION.   Henreitta Leber M.D on  02/11/2019 at 12:03 PM  Between 7am to 6pm - Pager - [phone]  After 6pm go to [email or web address] - Proofreader  [facility]  Office  [phone]  CC: Primary care physician; Perrin Maltese, MD

## 2019-02-11 NOTE — Progress Notes (Signed)
1045 Pt ambulated with 02 @ 2l/[place] in the hall around the nurses station- pulse ox 88% w 02 @2l /[place] with HR 100 while walking; noted DOE with audible wheeze. Pulse ox upon return to bed after walk 85% on 02@2l /[place] with HR 100 with respirations labored with audible wheeze and dry cough. Pt states he does not think he ready to go home today. Dysnea resolved with rest.

## 2019-02-12 LAB — CBC
HCT: 29.5 % — ABNORMAL LOW (ref 39.0–52.0)
Hemoglobin: 9.2 g/dL — ABNORMAL LOW (ref 13.0–17.0)
MCH: 26.4 pg (ref 26.0–34.0)
MCHC: 31.2 g/dL (ref 30.0–36.0)
MCV: 84.5 fL (ref 80.0–100.0)
PLATELETS: 198 10*3/uL (ref 150–400)
RBC: 3.49 MIL/uL — AB (ref 4.22–5.81)
RDW: 16.2 % — ABNORMAL HIGH (ref 11.5–15.5)
WBC: 10.7 10*3/uL — ABNORMAL HIGH (ref 4.0–10.5)
nRBC: 0 % (ref 0.0–0.2)

## 2019-02-12 LAB — BASIC METABOLIC PANEL
Anion gap: 10 (ref 5–15)
BUN: 77 mg/dL — ABNORMAL HIGH (ref 8–23)
CO2: 26 mmol/L (ref 22–32)
Calcium: 8.2 mg/dL — ABNORMAL LOW (ref 8.9–10.3)
Chloride: 104 mmol/L (ref 98–111)
Creatinine, Ser: 2.08 mg/dL — ABNORMAL HIGH (ref 0.61–1.24)
GFR calc Af Amer: 33 mL/min — ABNORMAL LOW (ref >60)
GFR, EST NON AFRICAN AMERICAN: 29 mL/min — AB (ref >60)
Glucose, Bld: 278 mg/dL — ABNORMAL HIGH (ref 70–99)
POTASSIUM: 4.2 mmol/L (ref 3.5–5.1)
Sodium: 140 mmol/L (ref 135–145)

## 2019-02-12 LAB — GLUCOSE, CAPILLARY
Glucose-Capillary: 259 mg/dL — ABNORMAL HIGH (ref 70–99)
Glucose-Capillary: 313 mg/dL — ABNORMAL HIGH (ref 70–99)

## 2019-02-12 MED ORDER — IPRATROPIUM-ALBUTEROL 0.5-2.5 (3) MG/3ML IN SOLN
3.0000 mL | Freq: Four times a day (QID) | RESPIRATORY_TRACT | Status: DC
  Administered 2019-02-12 (×2): 3 mL via RESPIRATORY_TRACT
  Filled 2019-02-12 (×2): qty 3

## 2019-02-12 MED ORDER — DOXYCYCLINE HYCLATE 100 MG PO TABS: 100.0000 mg | ORAL_TABLET | Freq: Two times a day (BID) | ORAL | 0 refills | Status: AC

## 2019-02-12 NOTE — Progress Notes (Signed)
Inpatient Diabetes Program Recommendations  AACE/ADA: New Consensus Statement on Inpatient Glycemic Control (2015)  Target Ranges:  Prepandial:   less than 140 mg/dL      Peak postprandial:   less than 180 mg/dL (1-2 hours)      Critically ill patients:  140 - 180 mg/dL   Results for Brett [name], Brett [name] (MRN [id number]) as of 02/12/2019 08:05  Ref. Range 02/11/2019 07:37 02/11/2019 11:44 02/11/2019 16:42 02/11/2019 21:47  Glucose-Capillary Latest Ref Range: 70 - 99 mg/dL 273 (H)  8 units NOVOLOG  314 (H)  11 units NOVOLOG  302 (H)  11 units NOVOLOG  248 (H)  2 units NOVOLOG    Results for Brett [name], Brett [name] (MRN [id number]) as of 02/12/2019 08:05  Ref. Range 02/12/2019 07:41  Glucose-Capillary Latest Ref Range: 70 - 99 mg/dL 259 (H)    Admit COPD Flare  History: DM  Home DM Meds: Amaryl 2 mg BID  Current Orders: Novolog Moderate Correction Scale/ SSI (0-15 units) TID AC + HS      Getting Solumedrol 60 mg BID--Last dose to be given this AM--then will switch to Prednisone 40 mg Daily tomorrow.    CBGs elevated likely due to steroids.    MD- Please consider the following in-hospital insulin adjustments while patient remains on steroids and home oral DM meds are on hold:  1. Lantus 8 units Daily (please start this AM)--(0.1 units/kg based on weight of 81 kg)  2. Start low dose Novolog Meal Coverage: Novolog 3 units TID with meals  (Please add the following Hold Parameters: Hold if pt eats <50% of meal, Hold if pt NPO)      --Will follow patient during hospitalization--  Wyn Quaker RN, MSN, CDE Diabetes Coordinator Inpatient Glycemic Control Team Team Pager: [phone] (8a-5p)

## 2019-02-12 NOTE — TOC Transition Note (Signed)
Transition of Care [facility]) - CM/SW Discharge Note   Patient Details  Name: Brett [name] MRN: [id number] Date of Birth: 1935-09-09  [facility]) CM/SW Contact:  Shelbie Ammons, RN Phone Number: 02/12/2019, 1:22 PM   Clinical Narrative:   Discharge to home today per Dr. Anselm Jungling.  Family will transport.  Floydene Flock, [facility] representative updated.     Final next level of care: [place] Barriers to Discharge: No Barriers Identified   Patient Goals and CMS Choice Patient states their goals for this hospitalization and ongoing recovery are:: (home) CMS Medicare.gov Compare Post Acute Care list provided to:: Patient Choice offered to / list presented to : Patient  Discharge Placement home                       Discharge Plan and Services Discharge Planning Services: CM Consult Post Acute Care Choice: [place].                  Social Determinants of Health (SDOH) Interventions None     Readmission Risk Interventions No flowsheet data found.

## 2019-02-12 NOTE — Care Management Important Message (Signed)
Important Message  Patient Details  Name: Brett [name] MRN: [id number] Date of Birth: [birth year]   Medicare Important Message Given:  Other (see comment)  Needs initial signed. Attempted to obtain signature, however patient asleep. No family in room.  Blank copy left in room.  Dannette Barbara 02/12/2019, 10:22 AM

## 2019-02-14 LAB — BLOOD GAS, VENOUS
Acid-Base Excess: 3.6 mmol/L — ABNORMAL HIGH (ref 0.0–2.0)
Bicarbonate: 31.1 mmol/L — ABNORMAL HIGH (ref 20.0–28.0)
O2 Saturation: 47.3 %
Patient temperature: 37
pCO2, Ven: 59 mmHg (ref 44.0–60.0)
pH, Ven: 7.33 (ref 7.250–7.430)

## 2019-02-18 NOTE — Discharge Summary (Signed)
Brett [name] at [facility] NAME: Brett [name]    MR#:  [id number]  DATE OF BIRTH:  1935/08/15  DATE OF ADMISSION:  02/09/2019 ADMITTING PHYSICIAN: Arta Silence, MD  DATE OF DISCHARGE: 02/12/2019  2:07 PM  PRIMARY CARE PHYSICIAN: Perrin Maltese, MD    ADMISSION DIAGNOSIS:  COPD exacerbation ([place]) [J44.1] Acute respiratory failure with hypoxia ([place]) [J96.01]  DISCHARGE DIAGNOSIS:  Active Problems:   Acute on chronic respiratory failure with hypoxemia ([place])   SECONDARY DIAGNOSIS:   Past Medical History:  Diagnosis Date  . Anemia   . Aortic regurgitation   . B-cell lymphoma ([place]) 2009   DX AT [facility]  . Bronchitis   . CAD (coronary artery disease)   . Carotid stenosis   . Diabetes mellitus without complication ([place])   . Diabetes mellitus, type 2 ([place])   . Emphysema of lung ([place])   . Essential hypertension   . History of chemotherapy   . Hyperlipidemia   . Hypertension   . IDA (iron deficiency anemia)   . Leg edema   . Meralgia paraesthetica   . Mitral regurgitation   . PUD (peptic ulcer disease)   . PVD (peripheral vascular disease) ([place])   . Tobacco abuse     HOSPITAL COURSE:   84 year old male with past medical history of COPD, diabetes, hypertension, peripheral vascular disease, iron deficiency anemia, history of coronary artery disease who presented to the hospital due to shortness of breath and noted to be in acute respiratory failure with hypoxia secondary to COPD exacerbation.  1.  Acute on chronic respiratory failure with hypoxia-secondary to COPD exacerbation. - Initially patient was admitted to stepdown level of care and was on BiPAP but now has been weaned off of it. -Currently on nasal cannula oxygen at 3 L and doing well.  Continue treatment for underlying COPD with IV steroids, scheduled duo nebs, Pulmicort nebs and slow to improve.   2.  COPD exacerbation-source of patient's worsening respiratory  failure with hypoxemia. - Continue IV steroids, scheduled duo nebs, Pulmicort nebs.  This is likely secondary to patient's RSV.  Patient's PCR was positive.  Continue supportive care.  Patient is already on oxygen at home. - Improved. Discharge and follow with PMD.  3. Essential hypertension-continue hydralazine, Norvasc, Imdur, Toprol.  4.  Diabetes type 2 without complication- blood sugars are somewhat labile due to patient being on IV steroids. -Continue sliding scale insulin for now.  5.  GERD-continue Protonix.  6.  Hyperlipidemia-continue Crestor.  7. Ac on CKD stage 3 on presentation due to RSV infection.     Improved, stable in hospital stay.  DISCHARGE CONDITIONS:   Stable.  CONSULTS OBTAINED:  Treatment Team:  Arta Silence, MD Tsosie Billing, MD  DRUG ALLERGIES:  No Known Allergies  DISCHARGE MEDICATIONS:   Allergies as of 02/12/2019   No Known Allergies     Medication List    TAKE these medications   albuterol 108 (90 Base) MCG/ACT inhaler Commonly known as:  PROVENTIL HFA;VENTOLIN HFA Inhale 2 puffs into the lungs every 6 (six) hours as needed for wheezing or shortness of breath.   amLODipine 5 MG tablet Commonly known as:  NORVASC Take 5 mg by mouth daily.   ATORVASTATIN CALCIUM PO Take by mouth daily.   blood glucose meter kit and supplies Kit Dispense based on patient and insurance preference. Use up to four times daily as directed. (FOR ICD-9 250.00, 250.01).   clopidogrel 75 MG  tablet Commonly known as:  PLAVIX Take 75 mg by mouth daily.   fluticasone 50 MCG/ACT nasal spray Commonly known as:  FLONASE Place 2 sprays into the nose daily as needed for allergies.   furosemide 40 MG tablet Commonly known as:  LASIX Take 0.5 tablets (20 mg total) by mouth daily.   gabapentin 100 MG capsule Commonly known as:  NEURONTIN Take 1 capsule by mouth 2 (two) times daily.   glimepiride 2 MG tablet Commonly known as:   AMARYL Take 1 tablet (2 mg total) by mouth 2 (two) times daily.   hydrALAZINE 100 MG tablet Commonly known as:  APRESOLINE Take 100 mg by mouth 3 (three) times daily.   ipratropium-albuterol 0.5-2.5 (3) MG/3ML Soln Commonly known as:  DUONEB Take 3 mLs by nebulization every 6 (six) hours as needed.   isosorbide mononitrate 30 MG 24 hr tablet Commonly known as:  IMDUR Take 30 mg by mouth daily.   metoprolol 200 MG 24 hr tablet Commonly known as:  TOPROL-XL Take 1 tablet (200 mg total) by mouth daily.   mometasone-formoterol 100-5 MCG/ACT Aero Commonly known as:  DULERA Inhale 2 puffs into the lungs 2 (two) times daily. What changed:    when to take this  reasons to take this   pantoprazole 40 MG tablet Commonly known as:  PROTONIX Take 40 mg by mouth daily.   predniSONE 10 MG tablet Commonly known as:  DELTASONE Label  & dispense according to the schedule below. 5 Pills PO for 2 days then, 4 Pills PO for 2 days, 3 Pills PO for 2 days, 2 Pills PO for 2 days, 1 Pill PO for 2 days then STOP.   tiotropium 18 MCG inhalation capsule Commonly known as:  Spiriva HandiHaler Place 1 capsule (18 mcg total) into inhaler and inhale daily.   Vitamin D (Ergocalciferol) 1.25 MG (50000 UT) Caps capsule Commonly known as:  DRISDOL Take 50,000 Units by mouth once a week.     ASK your doctor about these medications   doxycycline 100 MG tablet Commonly known as:  VIBRA-TABS Take 1 tablet (100 mg total) by mouth every 12 (twelve) hours for 3 days. Ask about: Should I take this medication?            Durable Medical Equipment  (From admission, onward)         Start     Ordered   02/11/19 0000  DME Nebulizer machine    Question:  Patient needs a nebulizer to treat with the following condition  Answer:  COPD (chronic obstructive pulmonary disease) ([place])   02/11/19 1002           DISCHARGE INSTRUCTIONS:    Follow with PMD in 1-2 weeks.  If you experience worsening of  your admission symptoms, develop shortness of breath, life threatening emergency, suicidal or homicidal thoughts you must seek medical attention immediately by calling 911 or calling your MD immediately  if symptoms less severe.  You Must read complete instructions/literature along with all the possible adverse reactions/side effects for all the Medicines you take and that have been prescribed to you. Take any new Medicines after you have completely understood and accept all the possible adverse reactions/side effects.   Please note  You were cared for by a hospitalist during your hospital stay. If you have any questions about your discharge medications or the care you received while you were in the hospital after you are discharged, you can call the unit and asked  to speak with the hospitalist on call if the hospitalist that took care of you is not available. Once you are discharged, your primary care physician will handle any further medical issues. Please note that NO REFILLS for any discharge medications will be authorized once you are discharged, as it is imperative that you return to your primary care physician (or establish a relationship with a primary care physician if you do not have one) for your aftercare needs so that they can reassess your need for medications and monitor your lab values.    Today   CHIEF COMPLAINT:   Chief Complaint  Patient presents with  . Shortness of Breath    HISTORY OF PRESENT ILLNESS:  Bryston Colocho  is a 84 y.o. male with a known history of severe COPD (2L [place] home O2), now p/w acute on chronic hypoxemic respiratory failure, acute COPD exacerbation. Pt on BiPAP at the time of my assessment. He is also severely hard of hearing. Family is not available at bedside. I am only able to obtain a limited Hx/ROS. Endorses SOB, onset "yesterday" (Thurs 02/08/2019), progressively worsening. Endorses cough, dry/non-productive. Tachypnoeic and hypoxic; SIRS (+), but  afebrile, (-) leukocytosis. CXR (-) infiltrate. Does not appear toxic. Endorses improvement on BiPAP, (-) active respiratory distress at the time of my assessment.  Admitted to [facility] 01/21/2019 - 01/23/2019 for community acquired bacterial pneumonia.  CXR on present admission (-) infiltrate.    VITAL SIGNS:  Blood pressure 118/64, pulse 75, temperature 97.8 F (36.6 C), temperature source Oral, resp. rate 17, height '5\' 10"'$  (1.778 m), weight 81.7 kg, SpO2 96 %.  I/O:  No intake or output data in the 24 hours ending 02/18/19 0848  PHYSICAL EXAMINATION:  GENERAL:  84 y.o.-year-old patient lying in the bed with no acute distress.  EYES: Pupils equal, round, reactive to light and accommodation. No scleral icterus. Extraocular muscles intact.  HEENT: Head atraumatic, normocephalic. Oropharynx and nasopharynx clear.  NECK:  Supple, no jugular venous distention. No thyroid enlargement, no tenderness.  LUNGS: Normal breath sounds bilaterally, no wheezing, rales,rhonchi or crepitation. No use of accessory muscles of respiration.  CARDIOVASCULAR: S1, S2 normal. No murmurs, rubs, or gallops.  ABDOMEN: Soft, non-tender, non-distended. Bowel sounds present. No organomegaly or mass.  EXTREMITIES: No pedal edema, cyanosis, or clubbing.  NEUROLOGIC: Cranial nerves II through XII are intact. Muscle strength 5/5 in all extremities. Sensation intact. Gait not checked.  PSYCHIATRIC: The patient is alert and oriented x 3.  SKIN: No obvious rash, lesion, or ulcer.   DATA REVIEW:   CBC Recent Labs  Lab 02/12/19 0433  WBC 10.7*  HGB 9.2*  HCT 29.5*  PLT 198    Chemistries  Recent Labs  Lab 02/12/19 0433  NA 140  K 4.2  CL 104  CO2 26  GLUCOSE 278*  BUN 77*  CREATININE 2.08*  CALCIUM 8.2*    Cardiac Enzymes No results for input(s): TROPONINI in the last 168 hours.  Microbiology Results  Results for orders placed or performed during the hospital encounter of 02/09/19  Respiratory Panel  by PCR     Status: Abnormal   Collection Time: 02/09/19  4:38 AM  Result Value Ref Range Status   Adenovirus NOT DETECTED NOT DETECTED Final   Coronavirus 229E NOT DETECTED NOT DETECTED Final    Comment: (NOTE) The Coronavirus on the Respiratory Panel, DOES NOT test for the novel  Coronavirus (2019 nCoV)    Coronavirus HKU1 NOT DETECTED NOT DETECTED Final  Coronavirus NL63 NOT DETECTED NOT DETECTED Final   Coronavirus OC43 NOT DETECTED NOT DETECTED Final   Metapneumovirus NOT DETECTED NOT DETECTED Final   Rhinovirus / Enterovirus NOT DETECTED NOT DETECTED Final   Influenza A NOT DETECTED NOT DETECTED Final   Influenza B NOT DETECTED NOT DETECTED Final   Parainfluenza Virus 1 NOT DETECTED NOT DETECTED Final   Parainfluenza Virus 2 NOT DETECTED NOT DETECTED Final   Parainfluenza Virus 3 NOT DETECTED NOT DETECTED Final   Parainfluenza Virus 4 NOT DETECTED NOT DETECTED Final   Respiratory Syncytial Virus DETECTED (A) NOT DETECTED Final    Comment: CRITICAL RESULT CALLED TO, READ BACK BY AND VERIFIED WITH: RN T FAIRING '@1124'$  02/09/2019 BY S [email or web address]    Bordetella pertussis NOT DETECTED NOT DETECTED Final   Chlamydophila pneumoniae NOT DETECTED NOT DETECTED Final   Mycoplasma pneumoniae NOT DETECTED NOT DETECTED Final    Comment: Performed at [facility], [place] [address] [place], [place] [address]  MRSA PCR Screening     Status: None   Collection Time: 02/09/19  4:38 AM  Result Value Ref Range Status   MRSA by PCR NEGATIVE NEGATIVE Final    Comment: Performed at [facility], [address] [place], [place] [address]    RADIOLOGY:  No results found.  EKG:   Orders placed or performed during the hospital encounter of 02/09/19  . ED EKG 12-Lead  . ED EKG 12-Lead  . EKG 12-Lead  . EKG 12-Lead  . EKG 12-Lead  . EKG 12-Lead      Management plans discussed with the patient, family and they are in agreement.  CODE STATUS:  Code Status History    Date Active  Date Inactive Code Status Order ID Comments User Context   02/09/2019 0410 02/12/2019 1734 Full Code [id number]  Arta Silence, MD Inpatient   01/21/2019 1852 01/23/2019 1954 DNR [id number]  Henreitta Leber, MD Inpatient   08/24/2018 0149 08/25/2018 1536 DNR [id number]  Amelia Jo, MD Inpatient   05/29/2018 1513 05/31/2018 1617 DNR [id number]  Gladstone Lighter, MD Inpatient   12/30/2017 1815 01/01/2018 1453 Full Code [id number]  Vaughan Basta, MD Inpatient   12/05/2017 2146 12/08/2017 1428 Full Code [id number]  Fritzi Mandes, MD Inpatient   11/13/2017 2123 11/16/2017 1704 Full Code [id number]  Idelle Crouch, MD Inpatient   09/22/2017 0036 09/24/2017 1837 Full Code [id number]  Salary, Avel Peace, MD Inpatient   09/22/2017 0033 09/22/2017 0036 DNR [id number]  Gorden Harms, MD ED   12/02/2016 0812 12/03/2016 2009 Full Code [id number]  Vaughan Basta, MD Inpatient   07/11/2016 0018 07/11/2016 1707 Full Code [id number]  Lance Coon, MD Inpatient   06/18/2016 0340 06/21/2016 1844 Full Code [id number]  Saundra Shelling, MD ED    Advance Directive Documentation     Most Recent Value  Type of Advance Directive  Healthcare Power of Attorney  Pre-existing out of facility DNR order (yellow form or pink MOST form)  -  "MOST" Form in Place?  -      TOTAL TIME TAKING CARE OF THIS PATIENT: 35 minutes.    Vaughan Basta M.D on 02/18/2019 at 8:48 AM  Between 7am to 6pm - Pager - [phone]  After 6pm go to [email or web address] - password [email or web address] [facility]  Office  [phone]  CC: Primary care physician; Perrin Maltese, MD   Note: This dictation was prepared with Dragon dictation along with smaller phrase technology. Any transcriptional errors that result from this  process are unintentional.

## 2019-05-22 ENCOUNTER — Emergency Department: Payer: Medicare PPO

## 2019-05-22 ENCOUNTER — Inpatient Hospital Stay
Admission: EM | Admit: 2019-05-22 | Discharge: 2019-05-25 | DRG: 871 | Disposition: A | Discharge status: Home Health | Payer: Medicare PPO | Attending: Internal Medicine | Admitting: Internal Medicine

## 2019-05-22 ENCOUNTER — Encounter: Payer: Self-pay | Admitting: Intensive Care

## 2019-05-22 ENCOUNTER — Other Ambulatory Visit: Payer: Self-pay

## 2019-05-22 DIAGNOSIS — Z8572 Personal history of non-Hodgkin lymphomas: Secondary | ICD-10-CM

## 2019-05-22 DIAGNOSIS — Z7984 Long term (current) use of oral hypoglycemic drugs: Secondary | ICD-10-CM

## 2019-05-22 DIAGNOSIS — E1151 Type 2 diabetes mellitus with diabetic peripheral angiopathy without gangrene: Secondary | ICD-10-CM | POA: Diagnosis present

## 2019-05-22 DIAGNOSIS — E1122 Type 2 diabetes mellitus with diabetic chronic kidney disease: Secondary | ICD-10-CM | POA: Diagnosis present

## 2019-05-22 DIAGNOSIS — J9811 Atelectasis: Secondary | ICD-10-CM | POA: Diagnosis present

## 2019-05-22 DIAGNOSIS — J9601 Acute respiratory failure with hypoxia: Secondary | ICD-10-CM

## 2019-05-22 DIAGNOSIS — H9190 Unspecified hearing loss, unspecified ear: Secondary | ICD-10-CM | POA: Diagnosis present

## 2019-05-22 DIAGNOSIS — I6529 Occlusion and stenosis of unspecified carotid artery: Secondary | ICD-10-CM | POA: Diagnosis present

## 2019-05-22 DIAGNOSIS — Z87891 Personal history of nicotine dependence: Secondary | ICD-10-CM

## 2019-05-22 DIAGNOSIS — Z1159 Encounter for screening for other viral diseases: Secondary | ICD-10-CM

## 2019-05-22 DIAGNOSIS — Z79899 Other long term (current) drug therapy: Secondary | ICD-10-CM

## 2019-05-22 DIAGNOSIS — E785 Hyperlipidemia, unspecified: Secondary | ICD-10-CM | POA: Diagnosis present

## 2019-05-22 DIAGNOSIS — I5032 Chronic diastolic (congestive) heart failure: Secondary | ICD-10-CM | POA: Diagnosis present

## 2019-05-22 DIAGNOSIS — Z9981 Dependence on supplemental oxygen: Secondary | ICD-10-CM | POA: Diagnosis not present

## 2019-05-22 DIAGNOSIS — Z66 Do not resuscitate: Secondary | ICD-10-CM | POA: Diagnosis present

## 2019-05-22 DIAGNOSIS — A419 Sepsis, unspecified organism: Secondary | ICD-10-CM | POA: Diagnosis not present

## 2019-05-22 DIAGNOSIS — I080 Rheumatic disorders of both mitral and aortic valves: Secondary | ICD-10-CM | POA: Diagnosis present

## 2019-05-22 DIAGNOSIS — E669 Obesity, unspecified: Secondary | ICD-10-CM | POA: Diagnosis present

## 2019-05-22 DIAGNOSIS — J209 Acute bronchitis, unspecified: Secondary | ICD-10-CM | POA: Diagnosis present

## 2019-05-22 DIAGNOSIS — Z6839 Body mass index (BMI) 39.0-39.9, adult: Secondary | ICD-10-CM | POA: Diagnosis not present

## 2019-05-22 DIAGNOSIS — I4891 Unspecified atrial fibrillation: Secondary | ICD-10-CM | POA: Diagnosis present

## 2019-05-22 DIAGNOSIS — J189 Pneumonia, unspecified organism: Secondary | ICD-10-CM

## 2019-05-22 DIAGNOSIS — Z9221 Personal history of antineoplastic chemotherapy: Secondary | ICD-10-CM

## 2019-05-22 DIAGNOSIS — D509 Iron deficiency anemia, unspecified: Secondary | ICD-10-CM | POA: Diagnosis present

## 2019-05-22 DIAGNOSIS — D631 Anemia in chronic kidney disease: Secondary | ICD-10-CM | POA: Diagnosis present

## 2019-05-22 DIAGNOSIS — Z8711 Personal history of peptic ulcer disease: Secondary | ICD-10-CM

## 2019-05-22 DIAGNOSIS — J9621 Acute and chronic respiratory failure with hypoxia: Secondary | ICD-10-CM | POA: Diagnosis present

## 2019-05-22 DIAGNOSIS — I248 Other forms of acute ischemic heart disease: Secondary | ICD-10-CM | POA: Diagnosis present

## 2019-05-22 DIAGNOSIS — N183 Chronic kidney disease, stage 3 (moderate): Secondary | ICD-10-CM | POA: Diagnosis present

## 2019-05-22 DIAGNOSIS — Z8249 Family history of ischemic heart disease and other diseases of the circulatory system: Secondary | ICD-10-CM

## 2019-05-22 DIAGNOSIS — I130 Hypertensive heart and chronic kidney disease with heart failure and stage 1 through stage 4 chronic kidney disease, or unspecified chronic kidney disease: Secondary | ICD-10-CM | POA: Diagnosis present

## 2019-05-22 DIAGNOSIS — J439 Emphysema, unspecified: Secondary | ICD-10-CM | POA: Diagnosis present

## 2019-05-22 DIAGNOSIS — I2510 Atherosclerotic heart disease of native coronary artery without angina pectoris: Secondary | ICD-10-CM | POA: Diagnosis present

## 2019-05-22 DIAGNOSIS — Z7902 Long term (current) use of antithrombotics/antiplatelets: Secondary | ICD-10-CM

## 2019-05-22 HISTORY — DX: Heart failure, unspecified: I50.9

## 2019-05-22 LAB — GLUCOSE, CAPILLARY: Glucose-Capillary: 337 mg/dL — ABNORMAL HIGH (ref 70–99)

## 2019-05-22 LAB — CBC WITH DIFFERENTIAL/PLATELET
Abs Immature Granulocytes: 0.07 10*3/uL (ref 0.00–0.07)
Basophils Absolute: 0 10*3/uL (ref 0.0–0.1)
Basophils Relative: 0 %
Eosinophils Absolute: 0.1 10*3/uL (ref 0.0–0.5)
Eosinophils Relative: 1 %
HCT: 34 % — ABNORMAL LOW (ref 39.0–52.0)
Hemoglobin: 10.7 g/dL — ABNORMAL LOW (ref 13.0–17.0)
Immature Granulocytes: 1 %
Lymphocytes Relative: 5 %
Lymphs Abs: 0.7 10*3/uL (ref 0.7–4.0)
MCH: 26.9 pg (ref 26.0–34.0)
MCHC: 31.5 g/dL (ref 30.0–36.0)
MCV: 85.4 fL (ref 80.0–100.0)
Monocytes Absolute: 1.1 10*3/uL — ABNORMAL HIGH (ref 0.1–1.0)
Monocytes Relative: 8 %
Neutro Abs: 10.9 10*3/uL — ABNORMAL HIGH (ref 1.7–7.7)
Neutrophils Relative %: 85 %
Platelets: 201 10*3/uL (ref 150–400)
RBC: 3.98 MIL/uL — ABNORMAL LOW (ref 4.22–5.81)
RDW: 14.5 % (ref 11.5–15.5)
WBC: 12.9 10*3/uL — ABNORMAL HIGH (ref 4.0–10.5)
nRBC: 0 % (ref 0.0–0.2)

## 2019-05-22 LAB — COMPREHENSIVE METABOLIC PANEL
ALT: 11 U/L (ref 0–44)
AST: 16 U/L (ref 15–41)
Albumin: 3.7 g/dL (ref 3.5–5.0)
Alkaline Phosphatase: 75 U/L (ref 38–126)
Anion gap: 9 (ref 5–15)
BUN: 25 mg/dL — ABNORMAL HIGH (ref 8–23)
CO2: 27 mmol/L (ref 22–32)
Calcium: 8.3 mg/dL — ABNORMAL LOW (ref 8.9–10.3)
Chloride: 100 mmol/L (ref 98–111)
Creatinine, Ser: 1.64 mg/dL — ABNORMAL HIGH (ref 0.61–1.24)
GFR calc Af Amer: 44 mL/min — ABNORMAL LOW (ref >60)
GFR calc non Af Amer: 38 mL/min — ABNORMAL LOW (ref >60)
Glucose, Bld: 183 mg/dL — ABNORMAL HIGH (ref 70–99)
Potassium: 3.8 mmol/L (ref 3.5–5.1)
Sodium: 136 mmol/L (ref 135–145)
Total Bilirubin: 0.5 mg/dL (ref 0.3–1.2)
Total Protein: 7.2 g/dL (ref 6.5–8.1)

## 2019-05-22 LAB — BRAIN NATRIURETIC PEPTIDE: B Natriuretic Peptide: 206 pg/mL — ABNORMAL HIGH (ref 0.0–100.0)

## 2019-05-22 LAB — PROTIME-INR
INR: 1.1 (ref 0.8–1.2)
Prothrombin Time: 13.8 seconds (ref 11.4–15.2)

## 2019-05-22 LAB — LACTIC ACID, PLASMA
Lactic Acid, Venous: 1.3 mmol/L (ref 0.5–1.9)
Lactic Acid, Venous: 1.4 mmol/L (ref 0.5–1.9)

## 2019-05-22 LAB — PROCALCITONIN: Procalcitonin: <0.1 ng/mL

## 2019-05-22 LAB — APTT: aPTT: 28 seconds (ref 24–36)

## 2019-05-22 LAB — TROPONIN I (HIGH SENSITIVITY)
Troponin I (High Sensitivity): 24 ng/L — ABNORMAL HIGH (ref <18)
Troponin I (High Sensitivity): 50 ng/L — ABNORMAL HIGH (ref <18)

## 2019-05-22 LAB — SARS CORONAVIRUS 2 BY RT PCR (HOSPITAL ORDER, PERFORMED IN ~~LOC~~ HOSPITAL LAB): SARS Coronavirus 2: NEGATIVE

## 2019-05-22 MED ORDER — SODIUM CHLORIDE 0.9 % IV SOLN
2.0000 g | INTRAVENOUS | Status: DC
  Administered 2019-05-22: 2 g via INTRAVENOUS
  Filled 2019-05-22: qty 20

## 2019-05-22 MED ORDER — ROSUVASTATIN CALCIUM 20 MG PO TABS: 40.0000 mg | ORAL_TABLET | Freq: Every day | ORAL | Status: DC

## 2019-05-22 MED ORDER — CLOPIDOGREL BISULFATE 75 MG PO TABS
75.0000 mg | ORAL_TABLET | Freq: Every day | ORAL | Status: DC
  Administered 2019-05-23 – 2019-05-25 (×3): 75 mg via ORAL
  Filled 2019-05-22 (×3): qty 1

## 2019-05-22 MED ORDER — IPRATROPIUM-ALBUTEROL 0.5-2.5 (3) MG/3ML IN SOLN
3.0000 mL | Freq: Four times a day (QID) | RESPIRATORY_TRACT | Status: DC
  Administered 2019-05-22 – 2019-05-25 (×10): 3 mL via RESPIRATORY_TRACT
  Filled 2019-05-22 (×11): qty 3

## 2019-05-22 MED ORDER — ROSUVASTATIN CALCIUM 10 MG PO TABS
40.0000 mg | ORAL_TABLET | Freq: Every day | ORAL | Status: DC
  Administered 2019-05-22 – 2019-05-24 (×3): 40 mg via ORAL
  Filled 2019-05-22 (×3): qty 4

## 2019-05-22 MED ORDER — SODIUM CHLORIDE 0.9 % IV SOLN
INTRAVENOUS | Status: DC
  Administered 2019-05-22: 17:00:00 via INTRAVENOUS

## 2019-05-22 MED ORDER — DILTIAZEM HCL 100 MG IV SOLR
5.0000 mg/h | INTRAVENOUS | Status: DC
  Administered 2019-05-22: 5 mg/h via INTRAVENOUS
  Filled 2019-05-22: qty 100

## 2019-05-22 MED ORDER — METHYLPREDNISOLONE SODIUM SUCC 40 MG IJ SOLR
40.0000 mg | Freq: Every day | INTRAMUSCULAR | Status: DC
  Administered 2019-05-22: 17:00:00 40 mg via INTRAVENOUS
  Filled 2019-05-22 (×2): qty 1

## 2019-05-22 MED ORDER — SODIUM CHLORIDE 0.9 % IV BOLUS: 500.0000 mL | Freq: Once | INTRAVENOUS | Status: DC

## 2019-05-22 MED ORDER — INSULIN ASPART 100 UNIT/ML ~~LOC~~ SOLN
0.0000 [IU] | Freq: Three times a day (TID) | SUBCUTANEOUS | Status: DC
  Administered 2019-05-23: 12:00:00 1 [IU] via SUBCUTANEOUS
  Administered 2019-05-23: 09:00:00 3 [IU] via SUBCUTANEOUS
  Administered 2019-05-23: 2 [IU] via SUBCUTANEOUS
  Administered 2019-05-24 (×2): 1 [IU] via SUBCUTANEOUS
  Administered 2019-05-24: 2 [IU] via SUBCUTANEOUS
  Administered 2019-05-25: 1 [IU] via SUBCUTANEOUS
  Filled 2019-05-22 (×6): qty 1

## 2019-05-22 MED ORDER — ONDANSETRON HCL 4 MG PO TABS
4.0000 mg | ORAL_TABLET | Freq: Four times a day (QID) | ORAL | Status: DC | PRN
  Filled 2019-05-22: qty 1

## 2019-05-22 MED ORDER — FUROSEMIDE 10 MG/ML IJ SOLN: 20.0000 mg | Freq: Once | INTRAMUSCULAR | Status: DC

## 2019-05-22 MED ORDER — ACETAMINOPHEN 650 MG RE SUPP
650.0000 mg | Freq: Four times a day (QID) | RECTAL | Status: DC | PRN
  Filled 2019-05-22: qty 1

## 2019-05-22 MED ORDER — ACETAMINOPHEN 500 MG PO TABS
1000.0000 mg | ORAL_TABLET | Freq: Once | ORAL | Status: AC
  Administered 2019-05-22: 12:00:00 1000 mg via ORAL
  Filled 2019-05-22: qty 2

## 2019-05-22 MED ORDER — AMIODARONE HCL 200 MG PO TABS
200.0000 mg | ORAL_TABLET | Freq: Two times a day (BID) | ORAL | Status: DC
  Administered 2019-05-22 – 2019-05-25 (×6): 200 mg via ORAL
  Filled 2019-05-22 (×7): qty 1

## 2019-05-22 MED ORDER — ENOXAPARIN SODIUM 40 MG/0.4ML ~~LOC~~ SOLN: 40.0000 mg | SUBCUTANEOUS | Status: DC

## 2019-05-22 MED ORDER — METOPROLOL TARTRATE 5 MG/5ML IV SOLN
5.0000 mg | INTRAVENOUS | Status: DC | PRN
  Filled 2019-05-22: qty 5

## 2019-05-22 MED ORDER — BUDESONIDE 0.5 MG/2ML IN SUSP
0.5000 mg | Freq: Two times a day (BID) | RESPIRATORY_TRACT | Status: DC
  Administered 2019-05-22 – 2019-05-25 (×6): 0.5 mg via RESPIRATORY_TRACT
  Filled 2019-05-22 (×6): qty 2

## 2019-05-22 MED ORDER — MAGNESIUM SULFATE 2 GM/50ML IV SOLN
2.0000 g | Freq: Once | INTRAVENOUS | Status: AC
  Administered 2019-05-22: 17:00:00 2 g via INTRAVENOUS
  Filled 2019-05-22 (×2): qty 50

## 2019-05-22 MED ORDER — INSULIN ASPART 100 UNIT/ML ~~LOC~~ SOLN
0.0000 [IU] | Freq: Every day | SUBCUTANEOUS | Status: DC
  Administered 2019-05-22: 23:00:00 4 [IU] via SUBCUTANEOUS
  Administered 2019-05-23: 2 [IU] via SUBCUTANEOUS
  Filled 2019-05-22 (×3): qty 1

## 2019-05-22 MED ORDER — AMIODARONE HCL 200 MG PO TABS
400.0000 mg | ORAL_TABLET | Freq: Once | ORAL | Status: AC
  Administered 2019-05-22: 17:00:00 400 mg via ORAL
  Filled 2019-05-22: qty 2

## 2019-05-22 MED ORDER — FLUTICASONE PROPIONATE 50 MCG/ACT NA SUSP
2.0000 | Freq: Every day | NASAL | Status: DC | PRN
  Filled 2019-05-22: qty 16

## 2019-05-22 MED ORDER — METOPROLOL SUCCINATE ER 100 MG PO TB24
200.0000 mg | ORAL_TABLET | Freq: Every day | ORAL | Status: DC
  Administered 2019-05-23 – 2019-05-25 (×3): 200 mg via ORAL
  Filled 2019-05-22 (×3): qty 2

## 2019-05-22 MED ORDER — SODIUM CHLORIDE 0.9 % IV SOLN
500.0000 mg | INTRAVENOUS | Status: DC
  Administered 2019-05-22: 13:00:00 500 mg via INTRAVENOUS
  Filled 2019-05-22: qty 500

## 2019-05-22 MED ORDER — PANTOPRAZOLE SODIUM 40 MG PO TBEC
40.0000 mg | DELAYED_RELEASE_TABLET | Freq: Every day | ORAL | Status: DC
  Administered 2019-05-23 – 2019-05-25 (×3): 40 mg via ORAL
  Filled 2019-05-22 (×3): qty 1

## 2019-05-22 MED ORDER — GABAPENTIN 100 MG PO CAPS
100.0000 mg | ORAL_CAPSULE | Freq: Two times a day (BID) | ORAL | Status: DC
  Administered 2019-05-22 – 2019-05-25 (×6): 100 mg via ORAL
  Filled 2019-05-22 (×6): qty 1

## 2019-05-22 MED ORDER — HEPARIN BOLUS VIA INFUSION
4000.0000 [IU] | Freq: Once | INTRAVENOUS | Status: AC
  Administered 2019-05-22: 17:00:00 4000 [IU] via INTRAVENOUS
  Filled 2019-05-22: qty 4000

## 2019-05-22 MED ORDER — ISOSORBIDE MONONITRATE ER 30 MG PO TB24
30.0000 mg | ORAL_TABLET | Freq: Every day | ORAL | Status: DC
  Administered 2019-05-23 – 2019-05-25 (×3): 30 mg via ORAL
  Filled 2019-05-22 (×3): qty 1

## 2019-05-22 MED ORDER — ACETAMINOPHEN 325 MG PO TABS: 650.0000 mg | ORAL_TABLET | Freq: Four times a day (QID) | ORAL | Status: DC | PRN

## 2019-05-22 MED ORDER — ONDANSETRON HCL 4 MG/2ML IJ SOLN: 4.0000 mg | Freq: Four times a day (QID) | INTRAMUSCULAR | Status: DC | PRN

## 2019-05-22 MED ORDER — FUROSEMIDE 20 MG PO TABS
20.0000 mg | ORAL_TABLET | Freq: Every day | ORAL | Status: DC
  Administered 2019-05-23: 20 mg via ORAL
  Filled 2019-05-22: qty 1

## 2019-05-22 MED ORDER — DILTIAZEM LOAD VIA INFUSION
10.0000 mg | Freq: Once | INTRAVENOUS | Status: DC
  Filled 2019-05-22: qty 10

## 2019-05-22 MED ORDER — DOXYCYCLINE HYCLATE 100 MG PO TABS: 100.0000 mg | ORAL_TABLET | Freq: Two times a day (BID) | ORAL | Status: DC

## 2019-05-22 MED ORDER — HEPARIN (PORCINE) 25000 UT/250ML-% IV SOLN
1250.0000 [IU]/h | INTRAVENOUS | Status: DC
  Administered 2019-05-22: 1250 [IU]/h via INTRAVENOUS
  Filled 2019-05-22 (×4): qty 250

## 2019-05-22 MED ORDER — SODIUM CHLORIDE 0.9 % IV SOLN
2.0000 g | INTRAVENOUS | Status: DC
  Administered 2019-05-23: 2 g via INTRAVENOUS
  Filled 2019-05-22: qty 2

## 2019-05-22 NOTE — ED Notes (Addendum)
Pt on 3L at this time via nasal cannula. Respirations even and unlabored. O2 at 97% at this time.

## 2019-05-22 NOTE — TOC Progression Note (Signed)
Transition of Care [facility]) - Progression Note    Patient Details  Name: Brett [name] MRN: [id number] Date of Birth: 05-Aug-1935  [facility] CM/SW Contact  Marshell Garfinkel, RN Phone Number: 05/22/2019, 2:38 PM  Clinical Narrative:    Patient is from home alone- very hard of hearing. Neighbor Claudette Head [phone] provides transportation. ED RNCM received call from [place] with [facility] states he is followed by nursing in the home.         Expected Discharge Plan and Services                                                 Social Determinants of Health (SDOH) Interventions    Readmission Risk Interventions No flowsheet data found.

## 2019-05-22 NOTE — ED Notes (Signed)
Attempted to call pt's friend [name] and provide update. Pt's friend not answering and voicemail is full. Unable to leave message.

## 2019-05-22 NOTE — ED Notes (Signed)
Pt pulled up in bed at this time. Pt resting comfortably, respirations even and unlabored.

## 2019-05-22 NOTE — ED Notes (Signed)
ED Provider at bedside. 

## 2019-05-22 NOTE — H&P (Signed)
[facility] [place] NAME: Brett [name]    MR#:  [id number]  DATE OF BIRTH:  1935/01/02  DATE OF ADMISSION:  05/22/2019  PRIMARY CARE PHYSICIAN: Perrin Maltese, MD   REQUESTING/REFERRING PHYSICIAN: Dr Duffy Bruce  CHIEF COMPLAINT:   Chief Complaint  Patient presents with  . Shortness of Breath  . Fever    HISTORY OF PRESENT ILLNESS:  Brett [name]  is a 84 y.o. male patient coming in with shortness of breath.  Patient is hard of hearing so difficult historian.  He complains of some soreness in his left chest and left upper abdomen.  He states he is short of breath when sitting up or lying down.  He felt some cold chills.  He had a headache.  In the ER he was found to be COVID-19 negative and in rapid atrial fibrillation.  Hospitalist services contacted for further evaluation.  PAST MEDICAL HISTORY:   Past Medical History:  Diagnosis Date  . Anemia   . Aortic regurgitation   . 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 Bronchitis   . CAD (coronary artery disease)   . Carotid stenosis   . CHF (congestive heart failure) ([place])   . Diabetes mellitus without complication ([place])   . Diabetes mellitus, type 2 ([place])   . Emphysema of lung ([place])   . Essential hypertension   . History of chemotherapy   . Hyperlipidemia   . Hypertension   . IDA (iron deficiency anemia)   . Leg edema   . Meralgia paraesthetica   . Mitral regurgitation   . PUD (peptic ulcer disease)   . PVD (peripheral vascular disease) ([place])   . Tobacco abuse     PAST SURGICAL HISTORY:   Past Surgical History:  Procedure Laterality Date  . CARDIAC CATHETERIZATION  08/15/2007  . CARDIAC CATHETERIZATION Right 07/13/2016   Procedure: Right/Left Heart Cath and Coronary Angiography;  Surgeon: Dionisio David, MD;  Location: [facility];  Service: Cardiovascular;  Laterality: Right;  . COLONOSCOPY  03/2013  . ESOPHAGOGASTRODUODENOSCOPY  03/2013     SOCIAL HISTORY:   Social History   Tobacco Use  . Smoking status: Former Smoker    Types: Cigarettes    Quit date: 08/30/2015    Years since quitting: 3.7  . Smokeless tobacco: Never Used  Substance Use Topics  . Alcohol use: No    Frequency: Never    FAMILY HISTORY:   Family History  Problem Relation Age of Onset  . CAD Mother   . Rheum arthritis Neg Hx   . Osteoarthritis Neg Hx   . Asthma Neg Hx   . Diabetes Neg Hx   . Cancer Neg Hx     DRUG ALLERGIES:  No Known Allergies  REVIEW OF SYSTEMS:  CONSTITUTIONAL: Positive for fever.  Positive for cold chills.  Positive for fatigue.  EYES: No blurred or double vision.  EARS, NOSE, AND THROAT: No tinnitus or ear pain. No sore throat RESPIRATORY: No cough, positive for shortness of breath shortness of breath.no wheezing or hemoptysis.  CARDIOVASCULAR: No chest pain, orthopnea, edema.  GASTROINTESTINAL: No nausea, vomiting, diarrhea.some abdominal pain no blood in bowel movements GENITOURINARY: No dysuria, hematuria.  ENDOCRINE: No polyuria, nocturia,  HEMATOLOGY: No anemia, easy bruising or bleeding SKIN: No rash or lesion. MUSCULOSKELETAL: No joint pain or arthritis.   NEUROLOGIC: No tingling, numbness, weakness.  PSYCHIATRY: No anxiety or depression.   MEDICATIONS AT HOME:  Prior to Admission medications   Medication Sig Start Date End Date Taking? Authorizing Provider  albuterol (PROVENTIL HFA;VENTOLIN HFA) 108 (90 Base) MCG/ACT inhaler Inhale 2 puffs into the lungs every 6 (six) hours as needed for wheezing or shortness of breath. 09/24/17  Yes Shawan Tosh, MD  amLODipine (NORVASC) 5 MG tablet Take 5 mg by mouth daily.  08/27/15  Yes [provider]  blood glucose meter kit and supplies KIT Dispense based on patient and insurance preference. Use up to four times daily as directed. (FOR ICD-9 250.00, 250.01). 08/25/18  Yes Sainani, Belia Heman, MD  clopidogrel (PLAVIX) 75 MG tablet Take 75 mg by mouth  daily.  08/27/15  Yes [provider]  furosemide (LASIX) 40 MG tablet Take 0.5 tablets (20 mg total) by mouth daily. 05/31/18 05/22/19 Yes Sainani, Belia Heman, MD  gabapentin (NEURONTIN) 100 MG capsule Take 1 capsule by mouth 2 (two) times daily.  09/22/15  Yes [provider]  glimepiride (AMARYL) 2 MG tablet Take 1 tablet (2 mg total) by mouth 2 (two) times daily. 08/25/18 05/22/19 Yes Sainani, Belia Heman, MD  hydrALAZINE (APRESOLINE) 100 MG tablet Take 100 mg by mouth 3 (three) times daily.   Yes [provider]  ipratropium-albuterol (DUONEB) 0.5-2.5 (3) MG/3ML SOLN Take 3 mLs by nebulization every 6 (six) hours as needed. 02/11/19  Yes Henreitta Leber, MD  isosorbide mononitrate (IMDUR) 30 MG 24 hr tablet Take 30 mg by mouth daily.   Yes [provider]  metoprolol (TOPROL-XL) 200 MG 24 hr tablet Take 1 tablet (200 mg total) by mouth daily. 09/24/17  Yes Torrance Frech, MD  mometasone-formoterol (DULERA) 100-5 MCG/ACT AERO Inhale 2 puffs into the lungs 2 (two) times daily. Patient taking differently: Inhale 2 puffs into the lungs 2 (two) times daily as needed for wheezing or shortness of breath.  05/31/18  Yes Sainani, Belia Heman, MD  rosuvastatin (CRESTOR) 40 MG tablet Take 40 mg by mouth daily. 04/13/19  Yes [provider]  tiotropium (SPIRIVA HANDIHALER) 18 MCG inhalation capsule Place 1 capsule (18 mcg total) into inhaler and inhale daily. 05/31/18  Yes Sainani, Belia Heman, MD  Vitamin D, Ergocalciferol, (DRISDOL) 1.25 MG (50000 UT) CAPS capsule Take 50,000 Units by mouth once a week.   Yes [provider]  fluticasone (FLONASE) 50 MCG/ACT nasal spray Place 2 sprays into the nose daily as needed for allergies.    [provider]  pantoprazole (PROTONIX) 40 MG tablet Take 40 mg by mouth daily.    [provider]  predniSONE (DELTASONE) 10 MG tablet Label  & dispense according to the schedule below. 5 Pills PO for 2 days then, 4 Pills PO for  2 days, 3 Pills PO for 2 days, 2 Pills PO for 2 days, 1 Pill PO for 2 days then STOP. 02/11/19   Henreitta Leber, MD      VITAL SIGNS:  Blood pressure (!) 98/51, pulse (!) 123, temperature (!) 102.4 F (39.1 C), temperature source Axillary, resp. rate (!) 34, height 5' 10"  (1.778 m), weight 90.7 kg, SpO2 97 %.  PHYSICAL EXAMINATION:  GENERAL:  84 y.o.-year-old patient lying in the bed with no acute distress.  EYES: Pupils equal, round, reactive to light and accommodation. No scleral icterus. Extraocular muscles intact.  HEENT: Head atraumatic, normocephalic. Oropharynx and nasopharynx clear.  NECK:  Supple, no jugular venous distention. No thyroid enlargement, no tenderness.  LUNGS: Normal breath sounds bilaterally, no wheezing, rales,rhonchi or crepitation. No use  of accessory muscles of respiration.  CARDIOVASCULAR: S1, S2 normal. No murmurs, rubs, or gallops.  ABDOMEN: Soft, nontender, nondistended. Bowel sounds present. No organomegaly or mass.  EXTREMITIES: No pedal edema, cyanosis, or clubbing.  NEUROLOGIC: Cranial nerves II through XII are intact. Muscle strength 5/5 in all extremities. Sensation intact. Gait not checked.  PSYCHIATRIC: The patient is alert and oriented x 3.  SKIN: No rash, lesion, or ulcer.   LABORATORY PANEL:   CBC Recent Labs  Lab 05/22/19 1117  WBC 12.9*  HGB 10.7*  HCT 34.0*  PLT 201   ------------------------------------------------------------------------------------------------------------------  Chemistries  Recent Labs  Lab 05/22/19 1117  NA 136  K 3.8  CL 100  CO2 27  GLUCOSE 183*  BUN 25*  CREATININE 1.64*  CALCIUM 8.3*  AST 16  ALT 11  ALKPHOS 75  BILITOT 0.5   ------------------------------------------------------------------------------------------------------------------    RADIOLOGY:  Dg Chest Port 1 View  Result Date: 05/22/2019 CLINICAL DATA:  Shortness of breath. EXAM: PORTABLE CHEST 1 VIEW COMPARISON:  Radiograph of  February 10, 2019. FINDINGS: Stable cardiomegaly. No pneumothorax is noted. Minimal right basilar subsegmental atelectasis or edema is noted. Mild left pleural effusion is noted with associated left basilar subsegmental atelectasis or edema. Bony thorax is unremarkable. IMPRESSION: Mild left pleural effusion is noted with associated left basilar subsegmental atelectasis or edema. Minimal right basilar subsegmental atelectasis or edema is noted. Electronically Signed   By: Marijo Conception M.D.   On: 05/22/2019 11:50    EKG:   Junctional tachycardia  IMPRESSION AND PLAN:   1.  Clinical sepsis.  Fever and tachycardia.  Not quite sure what I am treating yet.  COVID-19 negative.  Chest x-ray did not show pneumonia but I am suspecting pneumonia with the patient's symptoms of shortness of breath.  Urine analysis still pending.  We will give Rocephin and doxycycline. 2.  Atrial fibrillation with rapid ventricular response.  Patient took his metoprolol already today.  With his blood pressure little low will give IV fluids.  Also give IV magnesium.  Start oral amiodarone.  Cardiology consultation with Dr. Ceasar Mons.  Heparin drip started 3.  Type 2 diabetes mellitus.  We will put on sliding scale 4.  COPD on with chronic respiratory failure on 3 L of oxygen.  Put on nebulizer treatments and Solu-Medrol to get better air entry in the lungs. 5.  Relative hypotension.  Continue metoprolol and hold other antihypertensive medications. 6.  Chronic kidney disease stage III.  Continue to monitor closely here in the hospital. 7.  Obesity with a BMI of 39.3.  Weight loss needed 8.  Hyperlipidemia unspecified on Crestor    All the records are reviewed and case discussed with ED provider. Management plans discussed with the patient, and friend and they are in agreement.  CODE STATUS: Full code  TOTAL TIME TAKING CARE OF THIS PATIENT: 50 minutes.    Loletha Grayer M.D on 05/22/2019 at 2:03 PM  Between 7am to  6pm - Pager - [phone]  After 6pm call admission pager [phone]  [facility] Office  [phone]  CC: Primary care physician; Perrin Maltese, MD

## 2019-05-22 NOTE — ED Triage Notes (Signed)
C/o SOB that started 0400 today. Chills throughout night. EMS vitals 125/63 b/p, 99% 15L O2, cap 34, blood sugar 177, 101.7 oral, HR 140-160s. Baseline 2L O2. HX COPD, CHF, TIA, diabetes, HTN.

## 2019-05-22 NOTE — ED Notes (Signed)
ED TO INPATIENT HANDOFF REPORT  ED Nurse Name and Phone #: Charie Pinkus [phone]  S Name/Age/Gender Brett [name] 84 y.o. male Room/Bed: ED35A/ED35A  Code Status   Code Status: Full Code  Home/SNF/Other Home Patient oriented to: self, place, time and situation Is this baseline? Yes   Triage Complete: Triage complete  Chief Complaint sob  Triage Note C/o SOB that started 0400 today. Chills throughout night. EMS vitals 125/63 b/p, 99% 15L O2, cap 34, blood sugar 177, 101.7 oral, HR 140-160s. Baseline 2L O2. HX COPD, CHF, TIA, diabetes, HTN.    Allergies No Known Allergies  Level of Care/Admitting Diagnosis ED Disposition    ED Disposition Condition [facility] Area: [place] [[id number]]  Level of Care: Telemetry [5]  Covid Evaluation: Confirmed COVID Negative  Diagnosis: Atrial fibrillation with [facility]) [[id number]]  Admitting Physician: Loletha Grayer [[id number]]  Attending Physician: Loletha Grayer [phone]  Estimated length of stay: past midnight tomorrow  Certification:: I certify this patient will need inpatient services for at least 2 midnights  PT Class (Do Not Modify): Inpatient [101]  PT Acc Code (Do Not Modify): Private [1]       B Medical/Surgery History Past Medical History:  Diagnosis Date  . Anemia   . Aortic regurgitation   . B-cell lymphoma ([place]) 2009   DX AT [facility]  . Bronchitis   . CAD (coronary artery disease)   . Carotid stenosis   . CHF (congestive heart failure) ([place])   . Diabetes mellitus without complication ([place])   . Diabetes mellitus, type 2 ([place])   . Emphysema of lung ([place])   . Essential hypertension   . History of chemotherapy   . Hyperlipidemia   . Hypertension   . IDA (iron deficiency anemia)   . Leg edema   . Meralgia paraesthetica   . Mitral regurgitation   . PUD (peptic ulcer disease)   . PVD (peripheral vascular disease) ([place])   . Tobacco abuse    Past Surgical History:  Procedure  Laterality Date  . CARDIAC CATHETERIZATION  08/15/2007  . CARDIAC CATHETERIZATION Right 07/13/2016   Procedure: Right/Left Heart Cath and Coronary Angiography;  Surgeon: Dionisio David, MD;  Location: [place] [facility];  Service: Cardiovascular;  Laterality: Right;  . COLONOSCOPY  03/2013  . ESOPHAGOGASTRODUODENOSCOPY  03/2013     A IV Location/Drains/Wounds Patient Lines/Drains/Airways Status   Active Line/Drains/Airways    Name:   Placement date:   Placement time:   Site:   Days:   Peripheral IV 05/22/19 Right Antecubital   05/22/19    1102    Antecubital   less than 1   Peripheral IV 05/22/19 Left Antecubital   05/22/19    1102    Antecubital   less than 1   Peripheral IV 05/22/19 Right Hand   05/22/19    1223    Hand   less than 1          Intake/Output Last 24 hours No intake or output data in the 24 hours ending 05/22/19 1703  Labs/Imaging Results for orders placed or performed during the hospital encounter of 05/22/19 (from the past 48 hour(s))  Troponin I (High Sensitivity)     Status: Abnormal   Collection Time: 05/22/19 11:05 AM  Result Value Ref Range   Troponin I (High Sensitivity) 24 (H) <18 ng/L    Comment: (NOTE) Elevated high sensitivity troponin I (hsTnI) values and significant  changes across serial measurements may  suggest ACS but many other  chronic and acute conditions are known to elevate hsTnI results.  Refer to the "Links" section for chest pain algorithms and additional  guidance. Performed at [facility], [address] [place], [place] [address]   Lactic acid, plasma     Status: None   Collection Time: 05/22/19 11:17 AM  Result Value Ref Range   Lactic Acid, Venous 1.3 0.5 - 1.9 mmol/L    Comment: Performed at [facility], [address] [place], [place] [address]  Comprehensive metabolic panel     Status: Abnormal   Collection Time: 05/22/19 11:17 AM  Result Value Ref Range   Sodium 136 135 - 145 mmol/L   Potassium 3.8  3.5 - 5.1 mmol/L   Chloride 100 98 - 111 mmol/L   CO2 27 22 - 32 mmol/L   Glucose, Bld 183 (H) 70 - 99 mg/dL   BUN 25 (H) 8 - 23 mg/dL   Creatinine, Ser 1.64 (H) 0.61 - 1.24 mg/dL   Calcium 8.3 (L) 8.9 - 10.3 mg/dL   Total Protein 7.2 6.5 - 8.1 g/dL   Albumin 3.7 3.5 - 5.0 g/dL   AST 16 15 - 41 U/L   ALT 11 0 - 44 U/L   Alkaline Phosphatase 75 38 - 126 U/L   Total Bilirubin 0.5 0.3 - 1.2 mg/dL   GFR calc non Af Amer 38 (L) >60 mL/min   GFR calc Af Amer 44 (L) >60 mL/min   Anion gap 9 5 - 15    Comment: Performed at [facility], [address] [place], [place] [address]  CBC WITH DIFFERENTIAL     Status: Abnormal   Collection Time: 05/22/19 11:17 AM  Result Value Ref Range   WBC 12.9 (H) 4.0 - 10.5 K/uL   RBC 3.98 (L) 4.22 - 5.81 MIL/uL   Hemoglobin 10.7 (L) 13.0 - 17.0 g/dL   HCT 34.0 (L) 39.0 - 52.0 %   MCV 85.4 80.0 - 100.0 fL   MCH 26.9 26.0 - 34.0 pg   MCHC 31.5 30.0 - 36.0 g/dL   RDW 14.5 11.5 - 15.5 %   Platelets 201 150 - 400 K/uL   nRBC 0.0 0.0 - 0.2 %   Neutrophils Relative % 85 %   Neutro Abs 10.9 (H) 1.7 - 7.7 K/uL   Lymphocytes Relative 5 %   Lymphs Abs 0.7 0.7 - 4.0 K/uL   Monocytes Relative 8 %   Monocytes Absolute 1.1 (H) 0.1 - 1.0 K/uL   Eosinophils Relative 1 %   Eosinophils Absolute 0.1 0.0 - 0.5 K/uL   Basophils Relative 0 %   Basophils Absolute 0.0 0.0 - 0.1 K/uL   Immature Granulocytes 1 %   Abs Immature Granulocytes 0.07 0.00 - 0.07 K/uL    Comment: Performed at [facility], [place], [place] [address]  Procalcitonin - Baseline     Status: None   Collection Time: 05/22/19 11:17 AM  Result Value Ref Range   Procalcitonin <0.10 ng/mL    Comment:        Interpretation: PCT (Procalcitonin) <= 0.5 ng/mL: Systemic infection (sepsis) is not likely. Local bacterial infection is possible. (NOTE)       Sepsis PCT Algorithm           Lower Respiratory Tract  Infection PCT  Algorithm    ----------------------------     ----------------------------         PCT < 0.25 ng/mL                PCT < 0.10 ng/mL         Strongly encourage             Strongly discourage   discontinuation of antibiotics    initiation of antibiotics    ----------------------------     -----------------------------       PCT 0.25 - 0.50 ng/mL            PCT 0.10 - 0.25 ng/mL               OR       >80% decrease in PCT            Discourage initiation of                                            antibiotics      Encourage discontinuation           of antibiotics    ----------------------------     -----------------------------         PCT >= 0.50 ng/mL              PCT 0.26 - 0.50 ng/mL               AND        <80% decrease in PCT             Encourage initiation of                                             antibiotics       Encourage continuation           of antibiotics    ----------------------------     -----------------------------        PCT >= 0.50 ng/mL                  PCT > 0.50 ng/mL               AND         increase in PCT                  Strongly encourage                                      initiation of antibiotics    Strongly encourage escalation           of antibiotics                                     -----------------------------                                           PCT <= 0.25 ng/mL  OR                                        > 80% decrease in PCT                                     Discontinue / Do not initiate                                             antibiotics Performed at [facility], [address] [place], [place] [address]   SARS Coronavirus 2 (CEPHEID- Performed in [facility] hospital lab), Hosp Order     Status: None   Collection Time: 05/22/19 11:18 AM   Specimen: Nasopharyngeal Swab  Result Value Ref Range   SARS Coronavirus 2 NEGATIVE NEGATIVE    Comment: (NOTE) If  result is NEGATIVE SARS-CoV-2 target nucleic acids are NOT DETECTED. The SARS-CoV-2 RNA is generally detectable in upper and lower  respiratory specimens during the acute phase of infection. The lowest  concentration of SARS-CoV-2 viral copies this assay can detect is 250  copies / mL. A negative result does not preclude SARS-CoV-2 infection  and should not be used as the sole basis for treatment or other  patient management decisions.  A negative result may occur with  improper specimen collection / handling, submission of specimen other  than nasopharyngeal swab, presence of viral mutation(s) within the  areas targeted by this assay, and inadequate number of viral copies  (<250 copies / mL). A negative result must be combined with clinical  observations, patient history, and epidemiological information. If result is POSITIVE SARS-CoV-2 target nucleic acids are DETECTED. The SARS-CoV-2 RNA is generally detectable in upper and lower  respiratory specimens dur ing the acute phase of infection.  Positive  results are indicative of active infection with SARS-CoV-2.  Clinical  correlation with patient history and other diagnostic information is  necessary to determine patient infection status.  Positive results do  not rule out bacterial infection or co-infection with other viruses. If result is PRESUMPTIVE POSTIVE SARS-CoV-2 nucleic acids MAY BE PRESENT.   A presumptive positive result was obtained on the submitted specimen  and confirmed on repeat testing.  While 2019 novel coronavirus  (SARS-CoV-2) nucleic acids may be present in the submitted sample  additional confirmatory testing may be necessary for epidemiological  and / or clinical management purposes  to differentiate between  SARS-CoV-2 and other Sarbecovirus currently known to infect humans.  If clinically indicated additional testing with an alternate test  methodology [phone]) is advised. The SARS-CoV-2 RNA is generally   detectable in upper and lower respiratory sp ecimens during the acute  phase of infection. The expected result is Negative. Fact Sheet for Patients:  StrictlyIdeas.no Fact Sheet for Healthcare Providers: BankingDealers.co.za This test is not yet approved or cleared by the [place] FDA and has been authorized for detection and/or diagnosis of SARS-CoV-2 by FDA under an Emergency Use Authorization (EUA).  This EUA will remain in effect (meaning this test can be used) for the duration of the COVID-19 declaration under Section 564(b)(1) of the Act, 21 U.S.C. section 360bbb-3(b)(1), unless the authorization is terminated or revoked sooner. Performed  at [facility], [address] [place], [place] [address]   Brain natriuretic peptide     Status: Abnormal   Collection Time: 05/22/19 11:18 AM  Result Value Ref Range   B Natriuretic Peptide 206.0 (H) 0.0 - 100.0 pg/mL    Comment: Performed at [facility], [place], [place] [address]  Troponin I (High Sensitivity)     Status: Abnormal   Collection Time: 05/22/19 12:30 PM  Result Value Ref Range   Troponin I (High Sensitivity) 50 (H) <18 ng/L    Comment: (NOTE) Elevated high sensitivity troponin I (hsTnI) values and significant  changes across serial measurements may suggest ACS but many other  chronic and acute conditions are known to elevate hsTnI results.  Refer to the "Links" section for chest pain algorithms and additional  guidance. Performed at [facility], [address] [place], [place] [address]   Lactic acid, plasma     Status: None   Collection Time: 05/22/19 12:30 PM  Result Value Ref Range   Lactic Acid, Venous 1.4 0.5 - 1.9 mmol/L    Comment: Performed at [facility], [address] [place], [place] [address]  APTT     Status: None   Collection Time: 05/22/19  4:12 PM  Result Value Ref Range   aPTT 28 24 -  36 seconds    Comment: Performed at [facility], [address] [place], [place] [address]  Protime-INR     Status: None   Collection Time: 05/22/19  4:12 PM  Result Value Ref Range   Prothrombin Time 13.8 11.4 - 15.2 seconds   INR 1.1 0.8 - 1.2    Comment: (NOTE) INR goal varies based on device and disease states. Performed at [facility], [address] [place], [place] [address]    Dg Chest Port 1 View  Result Date: 05/22/2019 CLINICAL DATA:  Shortness of breath. EXAM: PORTABLE CHEST 1 VIEW COMPARISON:  Radiograph of February 10, 2019. FINDINGS: Stable cardiomegaly. No pneumothorax is noted. Minimal right basilar subsegmental atelectasis or edema is noted. Mild left pleural effusion is noted with associated left basilar subsegmental atelectasis or edema. Bony thorax is unremarkable. IMPRESSION: Mild left pleural effusion is noted with associated left basilar subsegmental atelectasis or edema. Minimal right basilar subsegmental atelectasis or edema is noted. Electronically Signed   By: Marijo Conception M.D.   On: 05/22/2019 11:50    Pending Labs Unresulted Labs (From admission, onward)    Start     Ordered   05/23/19 7673  Basic metabolic panel  Tomorrow morning,   STAT     05/22/19 1357   05/23/19 0500  CBC  Tomorrow morning,   STAT     05/22/19 1357   05/22/19 2300  Heparin level (unfractionated)  Once-Timed,   STAT     05/22/19 1432   05/22/19 1402  Urinalysis, Complete w Microscopic  Once,   STAT     05/22/19 1401   05/22/19 1402  Urine Culture  Once,   STAT    Question:  Patient immune status  Answer:  Normal   05/22/19 1401   05/22/19 1358  Hemoglobin A1c  Add-on,   AD    Comments: To assess prior glycemic control    05/22/19 1357   05/22/19 1117  Blood Culture (routine x 2)  BLOOD CULTURE X 2,   STAT     05/22/19 1116   05/22/19 1117  Urinalysis, Routine w reflex  microscopic  ONCE - STAT,   STAT     05/22/19 1116          Vitals/Pain Today's  Vitals   05/22/19 1427 05/22/19 1430 05/22/19 1614 05/22/19 1630  BP:  (!) 104/53 (!) 104/54 103/61  Pulse:  (!) 107 (!) 103 (!) 105  Resp:  20 (!) 27 (!) 27  Temp: 98.9 F (37.2 C)     TempSrc: Oral     SpO2:  92% 99% 97%  Weight:      Height:      PainSc:        Isolation Precautions No active isolations  Medications Medications  amiodarone (PACERONE) tablet 200 mg (has no administration in time range)  metoprolol tartrate (LOPRESSOR) injection 5 mg (has no administration in time range)  acetaminophen (TYLENOL) tablet 650 mg (has no administration in time range)    Or  acetaminophen (TYLENOL) suppository 650 mg (has no administration in time range)  ondansetron (ZOFRAN) tablet 4 mg (has no administration in time range)    Or  ondansetron (ZOFRAN) injection 4 mg (has no administration in time range)  insulin aspart (novoLOG) injection 0-9 Units (has no administration in time range)  insulin aspart (novoLOG) injection 0-5 Units (has no administration in time range)  furosemide (LASIX) tablet 20 mg (has no administration in time range)  isosorbide mononitrate (IMDUR) 24 hr tablet 30 mg (has no administration in time range)  metoprolol succinate (TOPROL-XL) 24 hr tablet 200 mg (has no administration in time range)  rosuvastatin (CRESTOR) tablet 40 mg (has no administration in time range)  pantoprazole (PROTONIX) EC tablet 40 mg (has no administration in time range)  clopidogrel (PLAVIX) tablet 75 mg (has no administration in time range)  gabapentin (NEURONTIN) capsule 100 mg (has no administration in time range)  fluticasone (FLONASE) 50 MCG/ACT nasal spray 2 spray (has no administration in time range)  ipratropium-albuterol (DUONEB) 0.5-2.5 (3) MG/3ML nebulizer solution 3 mL (has no administration in time range)  budesonide (PULMICORT) nebulizer solution 0.5 mg (has no administration in time range)  methylPREDNISolone sodium succinate (SOLU-MEDROL) 40 mg/mL injection 40 mg (40  mg Intravenous Given 05/22/19 1636)  doxycycline (VIBRA-TABS) tablet 100 mg (has no administration in time range)  cefTRIAXone (ROCEPHIN) 2 g in sodium chloride 0.9 % 100 mL IVPB (has no administration in time range)  0.9 %  sodium chloride infusion ( Intravenous New Bag/Given 05/22/19 1644)  magnesium sulfate IVPB 2 g 50 mL (2 g Intravenous New Bag/Given 05/22/19 1631)  heparin ADULT infusion 100 units/mL (25000 units/22mL sodium chloride 0.45%) (1,250 Units/hr Intravenous New Bag/Given 05/22/19 1634)  acetaminophen (TYLENOL) tablet 1,000 mg (1,000 mg Oral Given 05/22/19 1227)  amiodarone (PACERONE) tablet 400 mg (400 mg Oral Given 05/22/19 1639)  heparin bolus via infusion 4,000 Units (4,000 Units Intravenous Bolus from Bag 05/22/19 1635)    Mobility walks with device Low fall risk   Focused Assessments    R Recommendations: See Admitting Provider Note  Report given to:   Additional Notes:

## 2019-05-22 NOTE — ED Provider Notes (Signed)
[facility] Emergency Department Provider Note  ____________________________________________   First MD Initiated Contact with Patient 05/22/19 1053     (approximate)  I have reviewed the triage vital signs and the nursing notes.   HISTORY  Chief Complaint Shortness of Breath and Fever    HPI Brett [name] is a 84 y.o. male with past medical history as below including CHF, diabetes, emphysema, here with generalized weakness and shortness of breath.  The patient states that he was overall in his usual state of health for the last few days.  Starting last night, began to develop subjective fever, chills, and mild cough.  He awoke this morning, started to wake himself up, then noticed extreme shortness of breath.  He had chills and severe weakness.  He also had some mild left-sided chest pain that is worse with deep inspiration.  No leg swelling.  He states he subsequently presents for evaluation.  He does not recall whether he had a fever or not.  No specific recent sick contacts.  No recent travel.  No recent leg swelling or pain.  No other acute complaints.  No recent medication changes.  Symptoms worse with movement        Past Medical History:  Diagnosis Date   Anemia    Aortic regurgitation    B-cell lymphoma ([place]) 2009   DX AT [facility]   Bronchitis    CAD (coronary artery disease)    Carotid stenosis    CHF (congestive heart failure) ([place])    Diabetes mellitus without complication ([place])    Diabetes mellitus, type 2 (HCC)    Emphysema of lung ([place])    Essential hypertension    History of chemotherapy    Hyperlipidemia    Hypertension    IDA (iron deficiency anemia)    Leg edema    Meralgia paraesthetica    Mitral regurgitation    PUD (peptic ulcer disease)    PVD (peripheral vascular disease) ([place])    Tobacco abuse     Patient Active Problem List   Diagnosis Date Noted   Acute on chronic respiratory failure with  hypoxemia ([place]) 02/09/2019   Pneumonia 01/21/2019   Type 2 diabetes mellitus with hyperosmolar nonketotic hyperglycemia ([place]) 08/24/2018   CHF exacerbation ([place]) 05/29/2018   COPD exacerbation ([place]) 12/05/2017   CAP (community acquired pneumonia) 11/13/2017   Acute respiratory failure ([place]) 11/13/2017   SIRS (systemic inflammatory response syndrome) ([place]) 11/13/2017   Stroke ([place]) 12/02/2016   Stroke (cerebrum) ([place]) 12/02/2016   Leg weakness, bilateral 09/03/2016   Chronic diastolic heart failure ([place]) 08/06/2016   Pleural effusion 08/05/2016   Sepsis ([place]) 07/10/2016   HCAP (healthcare-associated pneumonia) 07/10/2016   HTN (hypertension) 07/10/2016   Diabetes ([place]) 07/10/2016   CAD (coronary artery disease) 07/10/2016   COPD (chronic obstructive pulmonary disease) ([place]) 06/20/2016   Chest pain 06/18/2016   B-cell lymphoma ([place]) 10/14/2015    Past Surgical History:  Procedure Laterality Date   CARDIAC CATHETERIZATION  08/15/2007   CARDIAC CATHETERIZATION Right 07/13/2016   Procedure: Right/Left Heart Cath and Coronary Angiography;  Surgeon: Dionisio David, MD;  Location: [facility];  Service: Cardiovascular;  Laterality: Right;   COLONOSCOPY  03/2013   ESOPHAGOGASTRODUODENOSCOPY  03/2013    Prior to Admission medications   Medication Sig Start Date End Date Taking? Authorizing Provider  albuterol (PROVENTIL HFA;VENTOLIN HFA) 108 (90 Base) MCG/ACT inhaler Inhale 2 puffs into the lungs every 6 (six) hours as needed for wheezing  or shortness of breath. 09/24/17  Yes Wieting, Richard, MD  amLODipine (NORVASC) 5 MG tablet Take 5 mg by mouth daily.  08/27/15  Yes [provider]  blood glucose meter kit and supplies KIT Dispense based on patient and insurance preference. Use up to four times daily as directed. (FOR ICD-9 250.00, 250.01). 08/25/18  Yes Sainani, Belia Heman, MD  clopidogrel (PLAVIX) 75 MG tablet Take 75 mg by mouth daily.  08/27/15  Yes  [provider]  furosemide (LASIX) 40 MG tablet Take 0.5 tablets (20 mg total) by mouth daily. 05/31/18 05/22/19 Yes Sainani, Belia Heman, MD  gabapentin (NEURONTIN) 100 MG capsule Take 1 capsule by mouth 2 (two) times daily.  09/22/15  Yes [provider]  glimepiride (AMARYL) 2 MG tablet Take 1 tablet (2 mg total) by mouth 2 (two) times daily. 08/25/18 05/22/19 Yes Sainani, Belia Heman, MD  hydrALAZINE (APRESOLINE) 100 MG tablet Take 100 mg by mouth 3 (three) times daily.   Yes [provider]  ipratropium-albuterol (DUONEB) 0.5-2.5 (3) MG/3ML SOLN Take 3 mLs by nebulization every 6 (six) hours as needed. 02/11/19  Yes Henreitta Leber, MD  isosorbide mononitrate (IMDUR) 30 MG 24 hr tablet Take 30 mg by mouth daily.   Yes [provider]  metoprolol (TOPROL-XL) 200 MG 24 hr tablet Take 1 tablet (200 mg total) by mouth daily. 09/24/17  Yes Wieting, Richard, MD  mometasone-formoterol (DULERA) 100-5 MCG/ACT AERO Inhale 2 puffs into the lungs 2 (two) times daily. Patient taking differently: Inhale 2 puffs into the lungs 2 (two) times daily as needed for wheezing or shortness of breath.  05/31/18  Yes Sainani, Belia Heman, MD  rosuvastatin (CRESTOR) 40 MG tablet Take 40 mg by mouth daily. 04/13/19  Yes [provider]  tiotropium (SPIRIVA HANDIHALER) 18 MCG inhalation capsule Place 1 capsule (18 mcg total) into inhaler and inhale daily. 05/31/18  Yes Sainani, Belia Heman, MD  Vitamin D, Ergocalciferol, (DRISDOL) 1.25 MG (50000 UT) CAPS capsule Take 50,000 Units by mouth once a week.   Yes [provider]  fluticasone (FLONASE) 50 MCG/ACT nasal spray Place 2 sprays into the nose daily as needed for allergies.    [provider]  pantoprazole (PROTONIX) 40 MG tablet Take 40 mg by mouth daily.    [provider]  predniSONE (DELTASONE) 10 MG tablet Label  & dispense according to the schedule below. 5 Pills PO for 2 days then, 4 Pills PO for 2 days, 3 Pills PO  for 2 days, 2 Pills PO for 2 days, 1 Pill PO for 2 days then STOP. 02/11/19   Henreitta Leber, MD    Allergies Patient has no known allergies.  Family History  Problem Relation Age of Onset   CAD Mother    Rheum arthritis Neg Hx    Osteoarthritis Neg Hx    Asthma Neg Hx    Diabetes Neg Hx    Cancer Neg Hx     Social History Social History   Tobacco Use   Smoking status: Former Smoker    Types: Cigarettes    Quit date: 08/30/2015    Years since quitting: 3.7   Smokeless tobacco: Never Used  Substance Use Topics   Alcohol use: No    Frequency: Never   Drug use: No    Review of Systems  Review of Systems  Constitutional: Positive for chills and fatigue. Negative for fever.  HENT: Negative for sore throat.   Respiratory: Positive for cough,  shortness of breath and wheezing.   Cardiovascular: Negative for chest pain.  Gastrointestinal: Positive for rectal pain. Negative for abdominal pain.  Genitourinary: Negative for flank pain.  Musculoskeletal: Negative for neck pain.  Skin: Negative for rash and wound.  Allergic/Immunologic: Negative for immunocompromised state.  Neurological: Positive for weakness. Negative for numbness.  Hematological: Does not bruise/bleed easily.     ____________________________________________  PHYSICAL EXAM:      VITAL SIGNS: ED Triage Vitals  Enc Vitals Group     BP 05/22/19 1057 (!) 156/77     Pulse Rate 05/22/19 1057 (!) 151     Resp 05/22/19 1057 (!) 34     Temp 05/22/19 1058 (!) 102.4 F (39.1 C)     Temp Source 05/22/19 1058 Axillary     SpO2 05/22/19 1057 95 %     Weight 05/22/19 1058 200 lb (90.7 kg)     Height 05/22/19 1058 5' 10"  (1.778 m)     Head Circumference --      Peak Flow --      Pain Score 05/22/19 1057 0     Pain Loc --      Pain Edu? --      Excl. in [place]? --      Physical Exam Vitals signs and nursing note reviewed.  Constitutional:      General: He is in acute distress.     Appearance: He is  well-developed. He is ill-appearing.  HENT:     Head: Normocephalic and atraumatic.  Eyes:     Conjunctiva/sclera: Conjunctivae normal.  Neck:     Musculoskeletal: Neck supple.  Cardiovascular:     Rate and Rhythm: Tachycardia present. Rhythm irregularly irregular.     Heart sounds: Normal heart sounds. No murmur. No friction rub.  Pulmonary:     Effort: Pulmonary effort is normal. Tachypnea present. No respiratory distress.     Breath sounds: Decreased air movement present. Examination of the right-lower field reveals rhonchi. Examination of the left-lower field reveals rhonchi. Rhonchi present. No wheezing or rales.  Abdominal:     General: There is no distension.     Palpations: Abdomen is soft.     Tenderness: There is no abdominal tenderness.  Musculoskeletal:     Right lower leg: No edema.     Left lower leg: No edema.  Skin:    General: Skin is warm.     Capillary Refill: Capillary refill takes less than 2 seconds.  Neurological:     Mental Status: He is alert and oriented to person, place, and time.     Motor: No abnormal muscle tone.       ____________________________________________   LABS (all labs ordered are listed, but only abnormal results are displayed)  Labs Reviewed  TROPONIN I (HIGH SENSITIVITY) - Abnormal; Notable for the following components:      Result Value   Troponin I (High Sensitivity) 24 (*)    All other components within normal limits  COMPREHENSIVE METABOLIC PANEL - Abnormal; Notable for the following components:   Glucose, Bld 183 (*)    BUN 25 (*)    Creatinine, Ser 1.64 (*)    Calcium 8.3 (*)    GFR calc non Af Amer 38 (*)    GFR calc Af Amer 44 (*)    All other components within normal limits  CBC WITH DIFFERENTIAL/PLATELET - Abnormal; Notable for the following components:   WBC 12.9 (*)    RBC 3.98 (*)    Hemoglobin  10.7 (*)    HCT 34.0 (*)    Neutro Abs 10.9 (*)    Monocytes Absolute 1.1 (*)    All other components within  normal limits  BRAIN NATRIURETIC PEPTIDE - Abnormal; Notable for the following components:   B Natriuretic Peptide 206.0 (*)    All other components within normal limits  SARS CORONAVIRUS 2 (HOSPITAL ORDER, PERFORMED IN [place] [facility])  CULTURE, BLOOD (ROUTINE X 2)  CULTURE, BLOOD (ROUTINE X 2)  LACTIC ACID, PLASMA  PROCALCITONIN  TROPONIN I (HIGH SENSITIVITY)  LACTIC ACID, PLASMA  URINALYSIS, ROUTINE W REFLEX MICROSCOPIC    ____________________________________________  EKG: Atrial fibrillation, ventricular rate 159.  QTc 483.  Nonspecific ST changes, likely rate related.  No ST elevations. ________________________________________  RADIOLOGY All imaging, including plain films, CT scans, and ultrasounds, independently reviewed by me, and interpretations confirmed via formal radiology reads.  ED MD interpretation:   Chest x-ray: Bilateral atelectasis versus infiltrate/effusions, worse on the left  Official radiology report(s): Dg Chest Port 1 View  Result Date: 05/22/2019 CLINICAL DATA:  Shortness of breath. EXAM: PORTABLE CHEST 1 VIEW COMPARISON:  Radiograph of February 10, 2019. FINDINGS: Stable cardiomegaly. No pneumothorax is noted. Minimal right basilar subsegmental atelectasis or edema is noted. Mild left pleural effusion is noted with associated left basilar subsegmental atelectasis or edema. Bony thorax is unremarkable. IMPRESSION: Mild left pleural effusion is noted with associated left basilar subsegmental atelectasis or edema. Minimal right basilar subsegmental atelectasis or edema is noted. Electronically Signed   By: Marijo Conception M.D.   On: 05/22/2019 11:50    ____________________________________________  PROCEDURES   Procedure(s) performed (including Critical Care):  .Critical Care Performed by: Duffy Bruce, MD Authorized by: Duffy Bruce, MD   Critical care provider statement:    Critical care time (minutes):  45   Critical care time was  exclusive of:  Separately billable procedures and treating other patients and teaching time   Critical care was necessary to treat or prevent imminent or life-threatening deterioration of the following conditions:  Cardiac failure, circulatory failure and sepsis   Critical care was time spent personally by me on the following activities:  Development of treatment plan with patient or surrogate, discussions with consultants, evaluation of patient's response to treatment, examination of patient, obtaining history from patient or surrogate, ordering and performing treatments and interventions, ordering and review of laboratory studies, ordering and review of radiographic studies, pulse oximetry, re-evaluation of patient's condition and review of old charts   I assumed direction of critical care for this patient from another provider in my specialty: no      ____________________________________________  INITIAL IMPRESSION / MDM / [place] / ED COURSE  As part of my medical decision making, I reviewed the following data within the electronic MEDICAL RECORD NUMBER Notes from prior ED visits and  Controlled Substance Database      *NOLEN LINDAMOOD was evaluated in Emergency Department on 05/22/2019 for the symptoms described in the history of present illness. He was evaluated in the context of the global COVID-19 pandemic, which necessitated consideration that the patient might be at risk for infection with the SARS-CoV-2 virus that causes COVID-19. Institutional protocols and algorithms that pertain to the evaluation of patients at risk for COVID-19 are in a state of rapid change based on information released by regulatory bodies including the CDC and federal and state organizations. These policies and algorithms were followed during the patient's care in the ED.  Some  ED evaluations and interventions may be delayed as a result of limited staffing during the pandemic.*      Medical Decision  Making: 84 year old male here with acute hypoxic respiratory failure and new onset atrial fibrillation with rapid ventricular response.  He is markedly febrile here.  Concern for sepsis secondary to suspected pneumonia.  Surprisingly, coronavirus testing is negative.  Lab work shows leukocytosis but is otherwise not far from his baseline, which is reassuring.  His exam does not appear like CHF.  Patient placed on diltiazem drip, started on empiric antibiotics and fluids.  Following diltiazem bolus and drip, patient did convert to mild sinus tachycardia, otherwise he has been given antipyretics, cautious fluids in the setting of his CHF with normal lactic acid, and will admit to medicine.  He is mentating well.  ____________________________________________  FINAL CLINICAL IMPRESSION(S) / ED DIAGNOSES  Final diagnoses:  Acute respiratory failure with hypoxia (HCC)  Sepsis due to pneumonia ([place])     MEDICATIONS GIVEN DURING THIS VISIT:  Medications  cefTRIAXone (ROCEPHIN) 2 g in sodium chloride 0.9 % 100 mL IVPB (0 g Intravenous Stopped 05/22/19 1246)  azithromycin (ZITHROMAX) 500 mg in sodium chloride 0.9 % 250 mL IVPB (500 mg Intravenous New Bag/Given 05/22/19 1247)  diltiazem (CARDIZEM) 1 mg/mL load via infusion 10 mg (has no administration in time range)    And  diltiazem (CARDIZEM) 100 mg in dextrose 5 % 100 mL (1 mg/mL) infusion (5 mg/hr Intravenous New Bag/Given 05/22/19 1214)  acetaminophen (TYLENOL) tablet 1,000 mg (1,000 mg Oral Given 05/22/19 1227)     ED Discharge Orders    None       Note:  This document was prepared using Dragon voice recognition software and may include unintentional dictation errors.   Duffy Bruce, MD 05/22/19 [phone]

## 2019-05-22 NOTE — TOC Progression Note (Deleted)
Transition of Care [facility]) - Progression Note    Patient Details  Name: Brett [name] MRN: [id number] Date of Birth: July 29, 1935  [facility] CM/SW Contact  Marshell Garfinkel, RN Phone Number: 05/22/2019, 2:35 PM  Clinical Narrative:    Patient is from home alone- very hard of hearing. Neighbor Claudette Head [phone] provides transportation. ED RNCM received call from [place] with [facility] states he is followed by nursing in the home.    Expected Discharge Plan: [place] Barriers to Discharge: No Barriers Identified  Expected Discharge Plan and Services Expected Discharge Plan: [place]   Discharge Planning Services: CM Consult Post Acute Care Choice: [place] arrangements for the past 2 months: Single Family Home Expected Discharge Date: 02/12/19                         HH Arranged: RN, PT, Social Work [place] Agency: [place] ([place]) Date [place]: 05/22/19 Time [place]: [place] Representative spoke with at [place]: [place] (SDOH) Interventions    Readmission Risk Interventions No flowsheet data found.

## 2019-05-22 NOTE — Consult Note (Signed)
ANTICOAGULATION CONSULT NOTE - Follow Up Consult  Pharmacy Consult for Heparin dosing  Indication: atrial fibrillation  No Known Allergies  Patient Measurements: Height: 5\' 10"  (177.8 cm) Weight: 200 lb (90.7 kg) IBW/kg (Calculated) : 73 Heparin Dosing Weight: 90.7 kg   Vital Signs: Temp: 102.4 F (39.1 C) (06/23 1058) Temp Source: Axillary (06/23 1058) BP: 96/54 (06/23 1400) Pulse Rate: 113 (06/23 1415)  Labs: Recent Labs    05/22/19 1117  HGB 10.7*  HCT 34.0*  PLT 201  CREATININE 1.64*    Estimated Creatinine Clearance: 39.3 mL/min (A) (by C-G formula based on SCr of 1.64 mg/dL (H)).   Medications:  No PTA anticoagulants   Assessment: Brett [name] is a 37 YOM with atrial fibrillation with rapid ventricular response on amiodarone.   Goal of Therapy:  Heparin level 0.3-0.7 units/ml Monitor platelets by anticoagulation protocol: Yes   Plan:  Baseline labs have been ordered  Heparin DW: 90.7 kg  Give 4000 units bolus x 1 Start heparin infusion at 1250 units/hr Check anti-Xa level in 8 hours and daily while on heparin, per protocol Continue to monitor H&H and platelets  Thank you for allowing pharmacy to be a part of this patient's care.   Rowland Lathe 05/22/2019,2:25 PM

## 2019-05-22 NOTE — Progress Notes (Signed)
CODE SEPSIS - PHARMACY COMMUNICATION  **Broad Spectrum Antibiotics should be administered within 1 hour of Sepsis diagnosis**  Time Code Sepsis Called/Page Received: 1121  Antibiotics Ordered: Azithromycin,Rocephin  Time of 1st antibiotic administration: 1212  Additional action taken by pharmacy: none  If necessary, Name of Provider/Nurse Contacted: n/a    Pearla Dubonnet ,PharmD Clinical Pharmacist  05/22/2019  12:13 PM

## 2019-05-23 ENCOUNTER — Other Ambulatory Visit: Payer: Self-pay | Admitting: Cardiovascular Disease

## 2019-05-23 ENCOUNTER — Inpatient Hospital Stay
Admit: 2019-05-23 | Discharge: 2019-05-23 | Disposition: A | Discharge status: Home / Self Care | Payer: Medicare PPO | Attending: Registered Nurse | Admitting: Registered Nurse

## 2019-05-23 LAB — URINALYSIS, COMPLETE (UACMP) WITH MICROSCOPIC
Bacteria, UA: NONE SEEN
Bilirubin Urine: NEGATIVE
Glucose, UA: 150 mg/dL — AB
Hgb urine dipstick: NEGATIVE
Ketones, ur: NEGATIVE mg/dL
Leukocytes,Ua: NEGATIVE
Nitrite: NEGATIVE
Protein, ur: 100 mg/dL — AB
Specific Gravity, Urine: 1.017 (ref 1.005–1.030)
Squamous Epithelial / HPF: NONE SEEN (ref 0–5)
pH: 5 (ref 5.0–8.0)

## 2019-05-23 LAB — BASIC METABOLIC PANEL
Anion gap: 10 (ref 5–15)
BUN: 36 mg/dL — ABNORMAL HIGH (ref 8–23)
CO2: 27 mmol/L (ref 22–32)
Calcium: 8.1 mg/dL — ABNORMAL LOW (ref 8.9–10.3)
Chloride: 100 mmol/L (ref 98–111)
Creatinine, Ser: 2.12 mg/dL — ABNORMAL HIGH (ref 0.61–1.24)
GFR calc Af Amer: 33 mL/min — ABNORMAL LOW (ref >60)
GFR calc non Af Amer: 28 mL/min — ABNORMAL LOW (ref >60)
Glucose, Bld: 281 mg/dL — ABNORMAL HIGH (ref 70–99)
Potassium: 4.3 mmol/L (ref 3.5–5.1)
Sodium: 137 mmol/L (ref 135–145)

## 2019-05-23 LAB — HEPARIN LEVEL (UNFRACTIONATED)
Heparin Unfractionated: 0.46 IU/mL (ref 0.30–0.70)
Heparin Unfractionated: 0.38 IU/mL (ref 0.30–0.70)

## 2019-05-23 LAB — CBC
HCT: 28.1 % — ABNORMAL LOW (ref 39.0–52.0)
Hemoglobin: 8.7 g/dL — ABNORMAL LOW (ref 13.0–17.0)
MCH: 26.4 pg (ref 26.0–34.0)
MCHC: 31 g/dL (ref 30.0–36.0)
MCV: 85.4 fL (ref 80.0–100.0)
Platelets: 172 10*3/uL (ref 150–400)
RBC: 3.29 MIL/uL — ABNORMAL LOW (ref 4.22–5.81)
RDW: 14.6 % (ref 11.5–15.5)
WBC: 14 10*3/uL — ABNORMAL HIGH (ref 4.0–10.5)
nRBC: 0 % (ref 0.0–0.2)

## 2019-05-23 LAB — GLUCOSE, CAPILLARY
Glucose-Capillary: 250 mg/dL — ABNORMAL HIGH (ref 70–99)
Glucose-Capillary: 238 mg/dL — ABNORMAL HIGH (ref 70–99)
Glucose-Capillary: 171 mg/dL — ABNORMAL HIGH (ref 70–99)
Glucose-Capillary: 204 mg/dL — ABNORMAL HIGH (ref 70–99)

## 2019-05-23 LAB — IRON AND TIBC
Iron: 11 ug/dL — ABNORMAL LOW (ref 45–182)
Saturation Ratios: 4 % — ABNORMAL LOW (ref 17.9–39.5)
TIBC: 257 ug/dL (ref 250–450)
UIBC: 246 ug/dL

## 2019-05-23 LAB — HEMOGLOBIN A1C
Hgb A1c MFr Bld: 7.2 % — ABNORMAL HIGH (ref 4.8–5.6)
Mean Plasma Glucose: 159.94 mg/dL

## 2019-05-23 LAB — TROPONIN I (HIGH SENSITIVITY): Troponin I (High Sensitivity): 21 ng/L — ABNORMAL HIGH (ref <18)

## 2019-05-23 LAB — VITAMIN B12: Vitamin B-12: 259 pg/mL (ref 180–914)

## 2019-05-23 LAB — PROCALCITONIN: Procalcitonin: 0.69 ng/mL

## 2019-05-23 LAB — HEMOGLOBIN: Hemoglobin: 9.6 g/dL — ABNORMAL LOW (ref 13.0–17.0)

## 2019-05-23 MED ORDER — SODIUM CHLORIDE 0.9 % IV SOLN
150.0000 mg | INTRAVENOUS | Status: DC
  Administered 2019-05-23 – 2019-05-24 (×2): 150 mg via INTRAVENOUS
  Filled 2019-05-23 (×3): qty 7.5

## 2019-05-23 MED ORDER — SODIUM CHLORIDE 0.9 % IV SOLN: 250.0000 mL | INTRAVENOUS | Status: DC | PRN

## 2019-05-23 MED ORDER — SODIUM CHLORIDE 0.9% FLUSH
3.0000 mL | Freq: Two times a day (BID) | INTRAVENOUS | Status: AC
  Filled 2019-05-23: qty 3

## 2019-05-23 MED ORDER — SODIUM CHLORIDE 0.9 % WEIGHT BASED INFUSION: 1.0000 mL/kg/h | INTRAVENOUS | Status: DC

## 2019-05-23 MED ORDER — FUROSEMIDE 20 MG PO TABS: 20.0000 mg | ORAL_TABLET | Freq: Every day | ORAL | Status: DC

## 2019-05-23 MED ORDER — SODIUM CHLORIDE 0.9% FLUSH: 3.0000 mL | INTRAVENOUS | Status: DC | PRN

## 2019-05-23 MED ORDER — SODIUM CHLORIDE 0.9 % WEIGHT BASED INFUSION: 3.0000 mL/kg/h | INTRAVENOUS | Status: DC

## 2019-05-23 MED ORDER — ASPIRIN 81 MG PO CHEW: 81.0000 mg | CHEWABLE_TABLET | ORAL | Status: AC

## 2019-05-23 MED ORDER — SODIUM BICARBONATE BOLUS VIA INFUSION: INTRAVENOUS | Status: DC

## 2019-05-23 MED ORDER — SODIUM CHLORIDE 0.9 % IV SOLN: 150.0000 mg | INTRAVENOUS | Status: DC

## 2019-05-23 MED ORDER — SODIUM BICARBONATE 8.4 % IV SOLN: INTRAVENOUS | Status: DC

## 2019-05-23 NOTE — Consult Note (Signed)
ANTICOAGULATION CONSULT NOTE - Follow Up Consult  Pharmacy Consult for Heparin dosing  Indication: atrial fibrillation  No Known Allergies  Patient Measurements: Height: 5\' 10"  (177.8 cm) Weight: 194 lb 1.6 oz (88 kg) IBW/kg (Calculated) : 73 Heparin Dosing Weight: 90.7 kg   Vital Signs: Temp: 97.6 F (36.4 C) (06/24 0425) Temp Source: Oral (06/24 0425) BP: 138/66 (06/24 0425) Pulse Rate: 87 (06/24 0425)  Labs: Recent Labs    05/22/19 1117 05/22/19 1612 05/23/19 0054 05/23/19 0648  HGB 10.7*  --   --  8.7*  HCT 34.0*  --   --  28.1*  PLT 201  --   --  172  APTT  --  28  --   --   LABPROT  --  13.8  --   --   INR  --  1.1  --   --   HEPARINUNFRC  --   --  0.46 0.38  CREATININE 1.64*  --   --  2.12*    Estimated Creatinine Clearance: 30 mL/min (A) (by C-G formula based on SCr of 2.12 mg/dL (H)).   Medications:  No PTA anticoagulants   Assessment: Mr. Genrich is a 23 YOM with atrial fibrillation with rapid ventricular response on amiodarone. Heparin DW: 90.7 kg  6/23 Heparin infusion started @ 1250 units/hr 6/24 0054 HL 0.46 6/24 0648 HL 0.38   Goal of Therapy:  Heparin level 0.3-0.7 units/ml Monitor platelets by anticoagulation protocol: Yes   Plan:  Heparin level is therapeutic x2.  Continue current heparin infusion rate of 1250 units/hr Check anti-Xa level and CBC with AM labs Continue to monitor H&H and platelets  Thank you for allowing pharmacy to be a part of this patient's care.  Oswald Hillock, PharmD, BCPS Clinical Pharmacist 05/23/2019 7:29 AM

## 2019-05-23 NOTE — Consult Note (Signed)
ANTICOAGULATION CONSULT NOTE - Follow Up Consult  Pharmacy Consult for Heparin dosing  Indication: atrial fibrillation  No Known Allergies  Patient Measurements: Height: 5\' 10"  (177.8 cm) Weight: 194 lb 1.6 oz (88 kg) IBW/kg (Calculated) : 73 Heparin Dosing Weight: 90.7 kg   Vital Signs: Temp: 98.1 F (36.7 C) (06/23 2009) Temp Source: Oral (06/23 2009) BP: 124/64 (06/23 2015) Pulse Rate: 93 (06/23 2015)  Labs: Recent Labs    05/22/19 1117 05/22/19 1612 05/23/19 0054  HGB 10.7*  --   --   HCT 34.0*  --   --   PLT 201  --   --   APTT  --  28  --   LABPROT  --  13.8  --   INR  --  1.1  --   HEPARINUNFRC  --   --  0.46  CREATININE 1.64*  --   --     Estimated Creatinine Clearance: 38.8 mL/min (A) (by C-G formula based on SCr of 1.64 mg/dL (H)).   Medications:  No PTA anticoagulants   Assessment: [name] is a 21 YOM with atrial fibrillation with rapid ventricular response on amiodarone. Heparin DW: 90.7 kg  6/23 Heparin infusion started @ 1250 units/hr/    Goal of Therapy:  Heparin level 0.3-0.7 units/ml Monitor platelets by anticoagulation protocol: Yes   Plan:  6/24 @ 0054 HL: 0.46. Level is therapeutic x1.  Continue current heparin infusion rate of 1250 units/hr Check confirmatory anti-Xa level in 6 hours and daily while on heparin, per protocol Continue to monitor H&H and platelets  Thank you for allowing pharmacy to be a part of this patient's care.  Pernell Dupre, PharmD, BCPS Clinical Pharmacist 05/23/2019 1:58 AM

## 2019-05-23 NOTE — Progress Notes (Signed)
Spoke to patients medical POA and he states patient is a DNR.  Spoke to Dr. Posey Pronto and she gave me a verbal order to change his code status to DNR.

## 2019-05-23 NOTE — Progress Notes (Signed)
[facility] NAME: Brett [name]    MR#:  [id number]  DATE OF BIRTH:  Dec 10, 1934  SUBJECTIVE:   Patient came in with some fever and chills. Denies any complaints. Feels better. Denies any chest pain. Wears oxygen on a chronic basis. Patient is very hard on hearing. No fever here. REVIEW OF SYSTEMS:   Review of Systems  Constitutional: Negative for chills, fever and weight loss.  HENT: Positive for hearing loss. Negative for ear discharge, ear pain and nosebleeds.   Eyes: Negative for blurred vision, pain and discharge.  Respiratory: Positive for cough and shortness of breath. Negative for sputum production, wheezing and stridor.   Cardiovascular: Negative for chest pain, palpitations, orthopnea and PND.  Gastrointestinal: Negative for abdominal pain, diarrhea, nausea and vomiting.  Genitourinary: Negative for frequency and urgency.  Musculoskeletal: Negative for back pain and joint pain.  Neurological: Positive for weakness. Negative for sensory change, speech change and focal weakness.  Psychiatric/Behavioral: Negative for depression and hallucinations. The patient is not nervous/anxious.    Tolerating Diet:yes Tolerating PT:   DRUG ALLERGIES:  No Known Allergies  VITALS:  Blood pressure 135/63, pulse 84, temperature 97.8 F (36.6 C), temperature source Oral, resp. rate 19, height 5\' 10"  (1.778 m), weight 88 kg, SpO2 95 %.  PHYSICAL EXAMINATION:   Physical Exam  GENERAL:  84 y.o.-year-old patient lying in the bed with no acute distress. Appears chronically ill EYES: Pupils equal, round, reactive to light and accommodation. No scleral icterus. Extraocular muscles intact.  HEENT: Head atraumatic, normocephalic. Oropharynx and nasopharynx clear.  NECK:  Supple, no jugular venous distention. No thyroid enlargement, no tenderness.  LUNGS decreased breath sounds bilaterally, no wheezing, rales, rhonchi. No use of  accessory muscles of respiration.  CARDIOVASCULAR: S1, S2 normal. No murmurs, rubs, or gallops.  ABDOMEN: Soft, nontender, nondistended. Bowel sounds present. No organomegaly or mass.  EXTREMITIES: No cyanosis, clubbing or edema b/l.    NEUROLOGIC: Cranial nerves II through XII are intact. No focal Motor or sensory deficits b/l. Overall weak PSYCHIATRIC:  patient is alert and oriented x 3.  SKIN: No obvious rash, lesion, or ulcer.   LABORATORY PANEL:  CBC Recent Labs  Lab 05/23/19 0648  WBC 14.0*  HGB 8.7*  HCT 28.1*  PLT 172    Chemistries  Recent Labs  Lab 05/22/19 1117 05/23/19 0648  NA 136 137  K 3.8 4.3  CL 100 100  CO2 27 27  GLUCOSE 183* 281*  BUN 25* 36*  CREATININE 1.64* 2.12*  CALCIUM 8.3* 8.1*  AST 16  --   ALT 11  --   ALKPHOS 75  --   BILITOT 0.5  --    Cardiac Enzymes No results for input(s): TROPONINI in the last 168 hours. RADIOLOGY:  Dg Chest Port 1 View  Result Date: 05/22/2019 CLINICAL DATA:  Shortness of breath. EXAM: PORTABLE CHEST 1 VIEW COMPARISON:  Radiograph of February 10, 2019. FINDINGS: Stable cardiomegaly. No pneumothorax is noted. Minimal right basilar subsegmental atelectasis or edema is noted. Mild left pleural effusion is noted with associated left basilar subsegmental atelectasis or edema. Bony thorax is unremarkable. IMPRESSION: Mild left pleural effusion is noted with associated left basilar subsegmental atelectasis or edema. Minimal right basilar subsegmental atelectasis or edema is noted. Electronically Signed   By: Brett [name] M.D.   On: 05/22/2019 11:50   ASSESSMENT AND PLAN:  Brett [name]  is a 84 y.o. male patient coming in  with shortness of breath.  Patient is hard of hearing so difficult historian.  He complains of some soreness in his left chest and left upper abdomen.  He states he is short of breath when sitting up or lying down.  He felt some cold chills.  1.  Clinical sepsis.  Fever and tachycardia--improved -   COVID-19 negative.   -Chest x-ray did not show pneumonia but I am suspecting bronchitis--change to po doxycycline -procalcitonin negative  2.  Atrial fibrillation with rapid ventricular response.  Patient took his metoprolol already today.   - Also received IV magnesium.   - oral amiodarone.  - Cardiology consultation with Dr. Ceasar [name].  Heparin drip started now d/ced given significant anemia and patient not having chest pain and troponin not rising. -No acute EKG changes. Patient remains controlled heart rate 80s on telemetry -cardiology workup can be considered as outpatient.  3.  Type 2 diabetes mellitus.  We will put on sliding scale  4.  COPD on with chronic respiratory failure on 3 L of oxygen.  Put on nebulizer treatments  -patient received Solu-Medrol. He is currently not wheezing. Continue PRN breathing treatment  5.  Relative hypotension.  Continue metoprolol and hold other antihypertensive medications. 6.  Chronic kidney disease stage III.  Continue to monitor closely here in the hospital. -Creatinine 2.12. Avoid nephrotoxic's. Monitor input output  7.  Obesity with a BMI of 39.3.  Weight loss needed  8.  Hyperlipidemia unspecified on Crestor  9. Anemia of chronic disease. Serum iron 11.  Will give IV venofer and po ferrous sulfate. Patient will follow-up with hematology Dr. Tasia [name] as outpatient. -Be vitamin B12 ordered -patient due to difficulty hearing does not remember if he had any G.I. workup done. However given his heart and lung condition he would be at a high risk for these procedures. Currently no active bleeding will manage conservatively  CODE STATUS: full  DVT Prophylaxis: *SCD  TOTAL TIME TAKING CARE OF THIS PATIENT: 30 minutes.  >50% time spent on counselling and coordination of care  POSSIBLE D/C IN 2-3 DAYS, DEPENDING ON CLINICAL CONDITION.  Note: This dictation was prepared with Dragon dictation along with smaller phrase technology. Any  transcriptional errors that result from this process are unintentional.  Brett [name] M.D on 05/23/2019 at 2:05 PM  Between 7am to 6pm - Pager - [phone]  After 6pm go to [email or web address] - password [email or web address] [facility]  Office  [phone]  CC: Primary care physician; Perrin Maltese, MDPatient ID: [name], male   DOB: 1935/10/29, 84 y.o.   MRN: [id number]

## 2019-05-23 NOTE — Plan of Care (Signed)
  Problem: Activity: Goal: Ability to tolerate increased activity will improve Outcome: Progressing   Problem: Respiratory: Goal: Ability to maintain adequate ventilation will improve Outcome: Progressing   Problem: Education: Goal: Knowledge of disease or condition will improve Outcome: Progressing   Problem: Activity: Goal: Ability to tolerate increased activity will improve Outcome: Progressing

## 2019-05-23 NOTE — Plan of Care (Signed)
  Problem: Activity: Goal: Ability to tolerate increased activity will improve Outcome: Progressing   Problem: Respiratory: Goal: Ability to maintain adequate ventilation will improve Outcome: Progressing   

## 2019-05-23 NOTE — Consult Note (Addendum)
Brett [name] is a 84 y.o. male  [id number]  Primary Cardiologist: Dr. Neoma Laming Reason for Consultation: Atrial Fibrillation with RVR  HPI: 84yo caucasian male with a past medical history of HTN, HLP, type 2 diabetes, CAD, congestive heart failure presented to ER with chills and weakness and shortness of breath. HEKG showed new onset atrial fibrillation with RVR. CXR showed bilateral subsegmental atelectasis or edema.    Review of Systems: Resting comfortably, feeling well. Denies chest pain or shortness of breath.   Past Medical History:  Diagnosis Date  . Anemia   . Aortic regurgitation   . B-cell lymphoma ([place]) 2009   DX AT [facility]  . Bronchitis   . CAD (coronary artery disease)   . Carotid stenosis   . CHF (congestive heart failure) ([place])   . Diabetes mellitus without complication ([place])   . Diabetes mellitus, type 2 ([place])   . Emphysema of lung ([place])   . Essential hypertension   . History of chemotherapy   . Hyperlipidemia   . Hypertension   . IDA (iron deficiency anemia)   . Leg edema   . Meralgia paraesthetica   . Mitral regurgitation   . PUD (peptic ulcer disease)   . PVD (peripheral vascular disease) ([place])   . Tobacco abuse     Medications Prior to Admission  Medication Sig Dispense Refill  . albuterol (PROVENTIL HFA;VENTOLIN HFA) 108 (90 Base) MCG/ACT inhaler Inhale 2 puffs into the lungs every 6 (six) hours as needed for wheezing or shortness of breath. 1 Inhaler 0  . amLODipine (NORVASC) 5 MG tablet Take 5 mg by mouth daily.     . blood glucose meter kit and supplies KIT Dispense based on patient and insurance preference. Use up to four times daily as directed. (FOR ICD-9 250.00, 250.01). 1 each 0  . clopidogrel (PLAVIX) 75 MG tablet Take 75 mg by mouth daily.     Marland Kitchen gabapentin (NEURONTIN) 100 MG capsule Take 1 capsule by mouth 2 (two) times daily.     Marland Kitchen glimepiride (AMARYL) 2 MG tablet Take 1 tablet (2 mg total) by mouth 2 (two) times daily. 60 tablet 0  .  hydrALAZINE (APRESOLINE) 100 MG tablet Take 100 mg by mouth 2 (two) times daily.     Marland Kitchen ipratropium-albuterol (DUONEB) 0.5-2.5 (3) MG/3ML SOLN Take 3 mLs by nebulization every 6 (six) hours as needed. 360 mL 0  . isosorbide mononitrate (IMDUR) 30 MG 24 hr tablet Take 30 mg by mouth daily.    . metoprolol (TOPROL-XL) 200 MG 24 hr tablet Take 1 tablet (200 mg total) by mouth daily. 30 tablet 0  . mometasone-formoterol (DULERA) 100-5 MCG/ACT AERO Inhale 2 puffs into the lungs 2 (two) times daily. (Patient taking differently: Inhale 2 puffs into the lungs 2 (two) times daily as needed for wheezing or shortness of breath. ) 1 Inhaler 0  . pantoprazole (PROTONIX) 40 MG tablet Take 40 mg by mouth daily.    . rosuvastatin (CRESTOR) 40 MG tablet Take 40 mg by mouth daily.    Marland Kitchen tiotropium (SPIRIVA HANDIHALER) 18 MCG inhalation capsule Place 1 capsule (18 mcg total) into inhaler and inhale daily. 30 capsule 0  . Vitamin D, Ergocalciferol, (DRISDOL) 1.25 MG (50000 UT) CAPS capsule Take 50,000 Units by mouth once a week.       Marland Kitchen amiodarone  200 mg Oral BID  . budesonide (PULMICORT) nebulizer solution  0.5 mg Nebulization BID  . clopidogrel  75 mg Oral Daily  . [  START ON 05/29/2019] doxycycline  100 mg Oral Q12H  . furosemide  20 mg Oral Daily  . gabapentin  100 mg Oral BID  . insulin aspart  0-5 Units Subcutaneous QHS  . insulin aspart  0-9 Units Subcutaneous TID WC  . ipratropium-albuterol  3 mL Nebulization Q6H  . isosorbide mononitrate  30 mg Oral Daily  . metoprolol  200 mg Oral Daily  . pantoprazole  40 mg Oral Daily  . rosuvastatin  40 mg Oral Q2000    Infusions: . cefTRIAXone (ROCEPHIN)  IV    . heparin 1,250 Units/hr (05/22/19 1634)    No Known Allergies  Social History   Socioeconomic History  . Marital status: Single    Spouse name: Not on file  . Number of children: Not on file  . Years of education: Not on file  . Highest education level: Not on file  Occupational History  .  Occupation: retired  Scientific laboratory technician  . Financial resource strain: Not on file  . Food insecurity    Worry: Not on file    Inability: Not on file  . Transportation needs    Medical: Not on file    Non-medical: Not on file  Tobacco Use  . Smoking status: Former Smoker    Types: Cigarettes    Quit date: 08/30/2015    Years since quitting: 3.7  . Smokeless tobacco: Never Used  Substance and Sexual Activity  . Alcohol use: No    Frequency: Never  . Drug use: No  . Sexual activity: Not on file  Lifestyle  . Physical activity    Days per week: Not on file    Minutes per session: Not on file  . Stress: Not on file  Relationships  . Social Herbalist on phone: Not on file    Gets together: Not on file    Attends religious service: Not on file    Active member of club or organization: Not on file    Attends meetings of clubs or organizations: Not on file    Relationship status: Not on file  . Intimate partner violence    Fear of current or ex partner: Not on file    Emotionally abused: Not on file    Physically abused: Not on file    Forced sexual activity: Not on file  Other Topics Concern  . Not on file  Social History Narrative   Lives in a duplex apartment by himself.  Has a walker to ambulate   -His friends and neighbors check on him        Family History  Problem Relation Age of Onset  . CAD Mother   . Rheum arthritis Neg Hx   . Osteoarthritis Neg Hx   . Asthma Neg Hx   . Diabetes Neg Hx   . Cancer Neg Hx     PHYSICAL EXAM: Vitals:   05/23/19 0425 05/23/19 0749  BP: 138/66 135/63  Pulse: 87 84  Resp: 18 19  Temp: 97.6 F (36.4 C) 97.8 F (36.6 C)  SpO2: 96% 95%     Intake/Output Summary (Last 24 hours) at 05/23/2019 0938 Last data filed at 05/23/2019 0857 Gross per 24 hour  Intake 561.06 ml  Output 650 ml  Net -88.94 ml    General:  Well appearing. No respiratory difficulty HEENT: normal Neck: supple. no JVD. Carotids 2+ bilat; no  bruits. No lymphadenopathy or thryomegaly appreciated. Cor: PMI nondisplaced. Regular rate & rhythm. No rubs,  gallops or murmurs. Lungs: clear Abdomen: soft, nontender, nondistended. No hepatosplenomegaly. No bruits or masses. Good bowel sounds. Extremities: no cyanosis, clubbing, rash, edema Neuro: alert & oriented x 3, cranial nerves grossly intact. moves all 4 extremities w/o difficulty. Affect pleasant.  ECG: Atrial fibrillation 134bpm  Results for orders placed or performed during the hospital encounter of 05/22/19 (from the past 24 hour(s))  Troponin I (High Sensitivity)     Status: Abnormal   Collection Time: 05/22/19 11:05 AM  Result Value Ref Range   Troponin I (High Sensitivity) 24 (H) <18 ng/L  Lactic acid, plasma     Status: None   Collection Time: 05/22/19 11:17 AM  Result Value Ref Range   Lactic Acid, Venous 1.3 0.5 - 1.9 mmol/L  Comprehensive metabolic panel     Status: Abnormal   Collection Time: 05/22/19 11:17 AM  Result Value Ref Range   Sodium 136 135 - 145 mmol/L   Potassium 3.8 3.5 - 5.1 mmol/L   Chloride 100 98 - 111 mmol/L   CO2 27 22 - 32 mmol/L   Glucose, Bld 183 (H) 70 - 99 mg/dL   BUN 25 (H) 8 - 23 mg/dL   Creatinine, Ser 1.64 (H) 0.61 - 1.24 mg/dL   Calcium 8.3 (L) 8.9 - 10.3 mg/dL   Total Protein 7.2 6.5 - 8.1 g/dL   Albumin 3.7 3.5 - 5.0 g/dL   AST 16 15 - 41 U/L   ALT 11 0 - 44 U/L   Alkaline Phosphatase 75 38 - 126 U/L   Total Bilirubin 0.5 0.3 - 1.2 mg/dL   GFR calc non Af Amer 38 (L) >60 mL/min   GFR calc Af Amer 44 (L) >60 mL/min   Anion gap 9 5 - 15  CBC WITH DIFFERENTIAL     Status: Abnormal   Collection Time: 05/22/19 11:17 AM  Result Value Ref Range   WBC 12.9 (H) 4.0 - 10.5 K/uL   RBC 3.98 (L) 4.22 - 5.81 MIL/uL   Hemoglobin 10.7 (L) 13.0 - 17.0 g/dL   HCT 34.0 (L) 39.0 - 52.0 %   MCV 85.4 80.0 - 100.0 fL   MCH 26.9 26.0 - 34.0 pg   MCHC 31.5 30.0 - 36.0 g/dL   RDW 14.5 11.5 - 15.5 %   Platelets 201 150 - 400 K/uL   nRBC 0.0  0.0 - 0.2 %   Neutrophils Relative % 85 %   Neutro Abs 10.9 (H) 1.7 - 7.7 K/uL   Lymphocytes Relative 5 %   Lymphs Abs 0.7 0.7 - 4.0 K/uL   Monocytes Relative 8 %   Monocytes Absolute 1.1 (H) 0.1 - 1.0 K/uL   Eosinophils Relative 1 %   Eosinophils Absolute 0.1 0.0 - 0.5 K/uL   Basophils Relative 0 %   Basophils Absolute 0.0 0.0 - 0.1 K/uL   Immature Granulocytes 1 %   Abs Immature Granulocytes 0.07 0.00 - 0.07 K/uL  Blood Culture (routine x 2)     Status: None (Preliminary result)   Collection Time: 05/22/19 11:17 AM   Specimen: BLOOD  Result Value Ref Range   Specimen Description BLOOD RIGHT ANTECUBITAL    Special Requests NONE    Culture      NO GROWTH < 24 HOURS Performed at [facility], [address] [place], [place] [address]    Report Status PENDING   Procalcitonin - Baseline     Status: None   Collection Time: 05/22/19 11:17 AM  Result Value Ref  Range   Procalcitonin <0.10 ng/mL  SARS Coronavirus 2 (CEPHEID- Performed in [facility] hospital lab), Hosp Order     Status: None   Collection Time: 05/22/19 11:18 AM   Specimen: Nasopharyngeal Swab  Result Value Ref Range   SARS Coronavirus 2 NEGATIVE NEGATIVE  Brain natriuretic peptide     Status: Abnormal   Collection Time: 05/22/19 11:18 AM  Result Value Ref Range   B Natriuretic Peptide 206.0 (H) 0.0 - 100.0 pg/mL  Blood Culture (routine x 2)     Status: None (Preliminary result)   Collection Time: 05/22/19 12:29 PM   Specimen: BLOOD  Result Value Ref Range   Specimen Description      BLOOD BLOOD RIGHT HAND Blood Culture results may not be optimal due to an excessive volume of blood received in culture bottles   Special Requests NONE    Culture      NO GROWTH < 24 HOURS Performed at [facility], [address] [place], [place] [address]    Report Status PENDING   Troponin I (High Sensitivity)     Status: Abnormal   Collection Time: 05/22/19 12:30 PM  Result Value Ref Range   Troponin I  (High Sensitivity) 50 (H) <18 ng/L  Lactic acid, plasma     Status: None   Collection Time: 05/22/19 12:30 PM  Result Value Ref Range   Lactic Acid, Venous 1.4 0.5 - 1.9 mmol/L  APTT     Status: None   Collection Time: 05/22/19  4:12 PM  Result Value Ref Range   aPTT 28 24 - 36 seconds  Protime-INR     Status: None   Collection Time: 05/22/19  4:12 PM  Result Value Ref Range   Prothrombin Time 13.8 11.4 - 15.2 seconds   INR 1.1 0.8 - 1.2  Glucose, capillary     Status: Abnormal   Collection Time: 05/22/19  9:49 PM  Result Value Ref Range   Glucose-Capillary 337 (H) 70 - 99 mg/dL  Hemoglobin A1c     Status: Abnormal   Collection Time: 05/23/19 12:54 AM  Result Value Ref Range   Hgb A1c MFr Bld 7.2 (H) 4.8 - 5.6 %   Mean Plasma Glucose 159.94 mg/dL  Heparin level (unfractionated)     Status: None   Collection Time: 05/23/19 12:54 AM  Result Value Ref Range   Heparin Unfractionated 0.46 0.30 - 0.70 IU/mL  Basic metabolic panel     Status: Abnormal   Collection Time: 05/23/19  6:48 AM  Result Value Ref Range   Sodium 137 135 - 145 mmol/L   Potassium 4.3 3.5 - 5.1 mmol/L   Chloride 100 98 - 111 mmol/L   CO2 27 22 - 32 mmol/L   Glucose, Bld 281 (H) 70 - 99 mg/dL   BUN 36 (H) 8 - 23 mg/dL   Creatinine, Ser 2.12 (H) 0.61 - 1.24 mg/dL   Calcium 8.1 (L) 8.9 - 10.3 mg/dL   GFR calc non Af Amer 28 (L) >60 mL/min   GFR calc Af Amer 33 (L) >60 mL/min   Anion gap 10 5 - 15  CBC     Status: Abnormal   Collection Time: 05/23/19  6:48 AM  Result Value Ref Range   WBC 14.0 (H) 4.0 - 10.5 K/uL   RBC 3.29 (L) 4.22 - 5.81 MIL/uL   Hemoglobin 8.7 (L) 13.0 - 17.0 g/dL   HCT 28.1 (L) 39.0 - 52.0 %   MCV 85.4 80.0 -  100.0 fL   MCH 26.4 26.0 - 34.0 pg   MCHC 31.0 30.0 - 36.0 g/dL   RDW 14.6 11.5 - 15.5 %   Platelets 172 150 - 400 K/uL   nRBC 0.0 0.0 - 0.2 %  Heparin level (unfractionated)     Status: None   Collection Time: 05/23/19  6:48 AM  Result Value Ref Range   Heparin  Unfractionated 0.38 0.30 - 0.70 IU/mL  Glucose, capillary     Status: Abnormal   Collection Time: 05/23/19  7:52 AM  Result Value Ref Range   Glucose-Capillary 250 (H) 70 - 99 mg/dL   Comment 1 Notify RN    Comment 2 Document in Chart   Urinalysis, Complete w Microscopic     Status: Abnormal   Collection Time: 05/23/19  8:55 AM  Result Value Ref Range   Color, Urine YELLOW (A) YELLOW   APPearance HAZY (A) CLEAR   Specific Gravity, Urine 1.017 1.005 - 1.030   pH 5.0 5.0 - 8.0   Glucose, UA 150 (A) NEGATIVE mg/dL   Hgb urine dipstick NEGATIVE NEGATIVE   Bilirubin Urine NEGATIVE NEGATIVE   Ketones, ur NEGATIVE NEGATIVE mg/dL   Protein, ur 100 (A) NEGATIVE mg/dL   Nitrite NEGATIVE NEGATIVE   Leukocytes,Ua NEGATIVE NEGATIVE   RBC / HPF 0-5 0 - 5 RBC/hpf   WBC, UA 0-5 0 - 5 WBC/hpf   Bacteria, UA NONE SEEN NONE SEEN   Squamous Epithelial / LPF NONE SEEN 0 - 5   Mucus PRESENT    Dg Chest Port 1 View  Result Date: 05/22/2019 CLINICAL DATA:  Shortness of breath. EXAM: PORTABLE CHEST 1 VIEW COMPARISON:  Radiograph of February 10, 2019. FINDINGS: Stable cardiomegaly. No pneumothorax is noted. Minimal right basilar subsegmental atelectasis or edema is noted. Mild left pleural effusion is noted with associated left basilar subsegmental atelectasis or edema. Bony thorax is unremarkable. IMPRESSION: Mild left pleural effusion is noted with associated left basilar subsegmental atelectasis or edema. Minimal right basilar subsegmental atelectasis or edema is noted. Electronically Signed   By: Marijo Conception M.D.   On: 05/22/2019 11:50     ASSESSMENT AND PLAN:   Addendum 5747: Troponin now down to 21, elevation is likely due to demand ischemia, will defer cardiac cath and plan for outpatient CCTA. May discontinue heparin.   NSTEMI/Elevated troponin: Advise cardiac cath tomorrow morning for elevated troponin. Echo ordered. Continue Heparin.  CCTA 12/12/2015: 1 - Calcium score is 2775.1.  2 - Right  dominant system.  3 - Mild mid LCX disease.  Diffusedly calcified LAD with no significant CAD.  RCA has mild mid disease.  Treat medically.  NST 09/14/17: PERFUSION/WALL MOTION FINDINGS: EF = 69%. Small mild apical and inferior wall fixed defects.                                                            IMPRESSION:  Equivocal stress test with normal LVEF.    Atrial fibrillation with RVR: Started on oral amiodarone, rate now in NSR 90-100bpm. Possible GI bleed due to 2 point drop in Hgb. Monitor carefully.    Jake Bathe, NP-C Cell: [phone]

## 2019-05-23 NOTE — Progress Notes (Signed)
*  PRELIMINARY RESULTS* Echocardiogram 2D Echocardiogram has been performed.  Brett [name] 05/23/2019, 1:55 PM

## 2019-05-24 ENCOUNTER — Encounter
Admission: EM | Disposition: A | Discharge status: Home Health | Payer: Self-pay | Source: Home / Self Care | Attending: Internal Medicine

## 2019-05-24 LAB — BASIC METABOLIC PANEL
Anion gap: 11 (ref 5–15)
BUN: 48 mg/dL — ABNORMAL HIGH (ref 8–23)
CO2: 28 mmol/L (ref 22–32)
Calcium: 8.7 mg/dL — ABNORMAL LOW (ref 8.9–10.3)
Chloride: 103 mmol/L (ref 98–111)
Creatinine, Ser: 2.08 mg/dL — ABNORMAL HIGH (ref 0.61–1.24)
GFR calc Af Amer: 33 mL/min — ABNORMAL LOW (ref >60)
GFR calc non Af Amer: 29 mL/min — ABNORMAL LOW (ref >60)
Glucose, Bld: 150 mg/dL — ABNORMAL HIGH (ref 70–99)
Potassium: 4.5 mmol/L (ref 3.5–5.1)
Sodium: 142 mmol/L (ref 135–145)

## 2019-05-24 LAB — CBC
HCT: 30.5 % — ABNORMAL LOW (ref 39.0–52.0)
Hemoglobin: 9.3 g/dL — ABNORMAL LOW (ref 13.0–17.0)
MCH: 26.1 pg (ref 26.0–34.0)
MCHC: 30.5 g/dL (ref 30.0–36.0)
MCV: 85.7 fL (ref 80.0–100.0)
Platelets: 208 10*3/uL (ref 150–400)
RBC: 3.56 MIL/uL — ABNORMAL LOW (ref 4.22–5.81)
RDW: 14.7 % (ref 11.5–15.5)
WBC: 15.1 10*3/uL — ABNORMAL HIGH (ref 4.0–10.5)
nRBC: 0 % (ref 0.0–0.2)

## 2019-05-24 LAB — GLUCOSE, CAPILLARY
Glucose-Capillary: 147 mg/dL — ABNORMAL HIGH (ref 70–99)
Glucose-Capillary: 200 mg/dL — ABNORMAL HIGH (ref 70–99)
Glucose-Capillary: 149 mg/dL — ABNORMAL HIGH (ref 70–99)
Glucose-Capillary: 157 mg/dL — ABNORMAL HIGH (ref 70–99)

## 2019-05-24 LAB — ECHOCARDIOGRAM COMPLETE
Height: 70 in
Weight: 3105.6 oz

## 2019-05-24 LAB — URINE CULTURE
Culture: NO GROWTH
Special Requests: NORMAL

## 2019-05-24 SURGERY — LEFT HEART CATH
Anesthesia: Moderate Sedation

## 2019-05-24 MED ORDER — HYDRALAZINE HCL 100 MG PO TABS: 100.0000 mg | ORAL_TABLET | Freq: Two times a day (BID) | ORAL | Status: DC

## 2019-05-24 MED ORDER — CEFDINIR 300 MG PO CAPS
300.0000 mg | ORAL_CAPSULE | Freq: Two times a day (BID) | ORAL | Status: DC
  Administered 2019-05-24 – 2019-05-25 (×3): 300 mg via ORAL
  Filled 2019-05-24 (×4): qty 1

## 2019-05-24 MED ORDER — AMLODIPINE BESYLATE 5 MG PO TABS
5.0000 mg | ORAL_TABLET | Freq: Every day | ORAL | Status: DC
  Administered 2019-05-24 – 2019-05-25 (×2): 5 mg via ORAL
  Filled 2019-05-24 (×2): qty 1

## 2019-05-24 MED ORDER — HYDRALAZINE HCL 50 MG PO TABS
100.0000 mg | ORAL_TABLET | Freq: Two times a day (BID) | ORAL | Status: DC
  Administered 2019-05-24 – 2019-05-25 (×3): 100 mg via ORAL
  Filled 2019-05-24 (×3): qty 2

## 2019-05-24 MED ORDER — FERROUS SULFATE 325 (65 FE) MG PO TABS
325.0000 mg | ORAL_TABLET | Freq: Two times a day (BID) | ORAL | Status: DC
  Administered 2019-05-24 – 2019-05-25 (×2): 325 mg via ORAL
  Filled 2019-05-24 (×2): qty 1

## 2019-05-24 NOTE — Progress Notes (Signed)
[facility] NAME: Anish Vana    MR#:  [id number]  DATE OF BIRTH:  September 26, 1935  SUBJECTIVE:   Patient came in with some fever and chills. Denies any complaints. Feels better. Denies any chest pain. Wears 2 literoxygen on a chronic basis. Patient is very hard on hearing. No fever here. Mild headache REVIEW OF SYSTEMS:   Review of Systems  Constitutional: Negative for chills, fever and weight loss.  HENT: Positive for hearing loss. Negative for ear discharge, ear pain and nosebleeds.   Eyes: Negative for blurred vision, pain and discharge.  Respiratory: Positive for cough and shortness of breath. Negative for sputum production, wheezing and stridor.   Cardiovascular: Negative for chest pain, palpitations, orthopnea and PND.  Gastrointestinal: Negative for abdominal pain, diarrhea, nausea and vomiting.  Genitourinary: Negative for frequency and urgency.  Musculoskeletal: Negative for back pain and joint pain.  Neurological: Positive for weakness. Negative for sensory change, speech change and focal weakness.  Psychiatric/Behavioral: Negative for depression and hallucinations. The patient is not nervous/anxious.    Tolerating Diet:yes Tolerating PT:   DRUG ALLERGIES:  No Known Allergies  VITALS:  Blood pressure (!) 149/70, pulse 90, temperature 97.7 F (36.5 C), temperature source Oral, resp. rate 20, height 5\' 10"  (1.778 m), weight 90.5 kg, SpO2 94 %.  PHYSICAL EXAMINATION:   Physical Exam  GENERAL:  84 y.o.-year-old patient lying in the bed with no acute distress.  Appears chronically ill EYES: Pupils equal, round, reactive to light and accommodation. No scleral icterus. Extraocular muscles intact.  HEENT: Head atraumatic, normocephalic. Oropharynx and nasopharynx clear. impaired hearing NECK:  Supple, no jugular venous distention. No thyroid enlargement, no tenderness.  LUNGS decreased breath sounds bilaterally,  no wheezing, rales, rhonchi. No use of accessory muscles of respiration.  CARDIOVASCULAR: S1, S2 normal. No murmurs, rubs, or gallops.  ABDOMEN: Soft, nontender, nondistended. Bowel sounds present. No organomegaly or mass.  EXTREMITIES: No cyanosis, clubbing or edema b/l.    NEUROLOGIC: Cranial nerves II through XII are intact. No focal Motor or sensory deficits b/l. Overall weak PSYCHIATRIC:  patient is alert and oriented x 3.  SKIN: No obvious rash, lesion, or ulcer.   LABORATORY PANEL:  CBC Recent Labs  Lab 05/24/19 0350  WBC 15.1*  HGB 9.3*  HCT 30.5*  PLT 208    Chemistries  Recent Labs  Lab 05/22/19 1117  05/24/19 0350  NA 136   < > 142  K 3.8   < > 4.5  CL 100   < > 103  CO2 27   < > 28  GLUCOSE 183*   < > 150*  BUN 25*   < > 48*  CREATININE 1.64*   < > 2.08*  CALCIUM 8.3*   < > 8.7*  AST 16  --   --   ALT 11  --   --   ALKPHOS 75  --   --   BILITOT 0.5  --   --    < > = values in this interval not displayed.   Cardiac Enzymes No results for input(s): TROPONINI in the last 168 hours. RADIOLOGY:  Dg Chest Port 1 View  Result Date: 05/22/2019 CLINICAL DATA:  Shortness of breath. EXAM: PORTABLE CHEST 1 VIEW COMPARISON:  Radiograph of February 10, 2019. FINDINGS: Stable cardiomegaly. No pneumothorax is noted. Minimal right basilar subsegmental atelectasis or edema is noted. Mild left pleural effusion is noted with associated left basilar subsegmental atelectasis  or edema. Bony thorax is unremarkable. IMPRESSION: Mild left pleural effusion is noted with associated left basilar subsegmental atelectasis or edema. Minimal right basilar subsegmental atelectasis or edema is noted. Electronically Signed   By: Marijo Conception M.D.   On: 05/22/2019 11:50   ASSESSMENT AND PLAN:  Brett [name]  is a 84 y.o. male patient coming in with shortness of breath.  Patient is hard of hearing so difficult historian.  He complains of some soreness in his left chest and left upper abdomen.   He states he is short of breath when sitting up or lying down.  He felt some cold chills.  1.  Clinical sepsis on admission--resolved - Fever and tachycardia--improved - COVID-19 negative.   -Chest x-ray did not show pneumonia but I am suspecting bronchitis--change to po ceftin -procalcitonin 0.69  2.  Atrial fibrillation with rapid ventricular response.   -Patient took his metoprolol already today.   - Also received IV magnesium.   - oral amiodarone.  - Cardiology consultation with Dr. Ceasar Mons.  Heparin drip started now d/ced given significant anemia and patient not having chest pain and troponin not rising. -No acute EKG changes. Patient remains controlled heart rate 80s on telemetry -cardiology workup can be considered as outpatient. -Echo EF 60-65%  3.  Type 2 diabetes mellitus.   -We will put on sliding scale  4.  COPD on with chronic respiratory failure on 2 L of oxygen.  - on nebulizer treatments  -patient received Solu-Medrol. He is currently not wheezing.  -Continue PRN breathing treatment  5.  HTN  -Continue metoprolol, hydralazine and norvasc  6.  Chronic kidney disease stage III.  Continue to monitor closely here in the hospital. -Creatinine 2.12--2.0 -. Avoid nephrotoxic's. Monitor input output  7.  Obesity with a BMI of 39.3.  8.  Hyperlipidemia unspecified on Crestor  9. Anemia of chronic disease. Serum iron 11.  Will give IV venofer and po ferrous sulfate. Patient will follow-up with hematology Dr. Tasia Catchings as outpatient. -vitamin B12 ok 259 -patient due to difficulty hearing does not remember if he had any G.I. workup done. However given his heart and lung condition he would be at a high risk for these procedures. Currently no active bleeding will manage conservatively  PT to see today D/c in am if cont to improve  CODE STATUS: full  DVT Prophylaxis: *SCD  TOTAL TIME TAKING CARE OF THIS PATIENT: 30 minutes.  >50% time spent on counselling and  coordination of care  POSSIBLE D/C IN 1 DAY DEPENDING ON CLINICAL CONDITION.  Note: This dictation was prepared with Dragon dictation along with smaller phrase technology. Any transcriptional errors that result from this process are unintentional.  Fritzi Mandes M.D on 05/24/2019 at 9:44 AM  Between 7am to 6pm - Pager - [phone]  After 6pm go to [email or web address] - password [email or web address] [facility]  Office  [phone]  CC: Primary care physician; Perrin Maltese, MDPatient ID: [name], male   DOB: 1935/05/24, 84 y.o.   MRN: [id number]

## 2019-05-24 NOTE — Progress Notes (Signed)
SUBJECTIVE: Patient is feeling fine   Vitals:   05/23/19 1922 05/24/19 0212 05/24/19 0423 05/24/19 0746  BP: 111/63  (!) 142/68 (!) 149/70  Pulse: 79  87 90  Resp: 18  20 20   Temp: 97.8 F (36.6 C)  98.1 F (36.7 C) 97.7 F (36.5 C)  TempSrc: Oral  Oral Oral  SpO2: 91% 95% 91% 94%  Weight:   90.5 kg   Height:        Intake/Output Summary (Last 24 hours) at 05/24/2019 1147 Last data filed at 05/24/2019 1013 Gross per 24 hour  Intake 832.19 ml  Output 1975 ml  Net -1142.81 ml    LABS: Basic Metabolic Panel: Recent Labs    05/23/19 0648 05/24/19 0350  NA 137 142  K 4.3 4.5  CL 100 103  CO2 27 28  GLUCOSE 281* 150*  BUN 36* 48*  CREATININE 2.12* 2.08*  CALCIUM 8.1* 8.7*   Liver Function Tests: Recent Labs    05/22/19 1117  AST 16  ALT 11  ALKPHOS 75  BILITOT 0.5  PROT 7.2  ALBUMIN 3.7   No results for input(s): LIPASE, AMYLASE in the last 72 hours. CBC: Recent Labs    05/22/19 1117 05/23/19 0648 05/23/19 1322 05/24/19 0350  WBC 12.9* 14.0*  --  15.1*  NEUTROABS 10.9*  --   --   --   HGB 10.7* 8.7* 9.6* 9.3*  HCT 34.0* 28.1*  --  30.5*  MCV 85.4 85.4  --  85.7  PLT 201 172  --  208   Cardiac Enzymes: No results for input(s): CKTOTAL, CKMB, CKMBINDEX, TROPONINI in the last 72 hours. BNP: Invalid input(s): POCBNP D-Dimer: No results for input(s): DDIMER in the last 72 hours. Hemoglobin A1C: Recent Labs    05/23/19 0054  HGBA1C 7.2*   Fasting Lipid Panel: No results for input(s): CHOL, HDL, LDLCALC, TRIG, CHOLHDL, LDLDIRECT in the last 72 hours. Thyroid Function Tests: No results for input(s): TSH, T4TOTAL, T3FREE, THYROIDAB in the last 72 hours.  Invalid input(s): FREET3 Anemia Panel: Recent Labs    05/23/19 0648  VITAMINB12 259  TIBC 257  IRON 11*     PHYSICAL EXAM General: Well developed, well nourished, in no acute distress HEENT:  Normocephalic and atramatic Neck:  No JVD.  Lungs: Clear bilaterally to auscultation and  percussion. Heart: HRRR . Normal S1 and S2 without gallops or murmurs.  Abdomen: Bowel sounds are positive, abdomen soft and non-tender  Msk:  Back normal, normal gait. Normal strength and tone for age. Extremities: No clubbing, cyanosis or edema.   Neuro: Alert and oriented X 3. Psych:  Good affect, responds appropriately  TELEMETRY: Sinus rhythm  ASSESSMENT AND PLAN: Status post new onset atrial fibrillation with history of mild CAD on CTA coronaries and mildly elevated troponin due to type II myocardial infarction.  Patient had electrical cardioversion within questionable 48 hours and may need anticoagulation if GI is able to treat the GI bleed but at this time is reasonable to withhold anticoagulants.  Active Problems:   Atrial fibrillation with RVR (HCC)    Neoma Laming A, MD, [facility] 05/24/2019 11:47 AM

## 2019-05-24 NOTE — Evaluation (Signed)
Physical Therapy Evaluation Patient Details Name: Brett [name] MRN: [id number] DOB: [birth year] Today's Date: 05/24/2019   History of Present Illness  Patient is an 84 y/o male that presents with fevers, chills, shortness of breath. He is typically on 2L of O2 at home for management of COPD. Patient has limited vision and hearing, history of b cell lymphoma.  Clinical Impression  Patient is an 84 y/o male that presents with fevers, chills. He lives independently at home in a duplex with support from friends and family, and denies use of assistive devices at baseline. He demonstrates excellent functional strength and mobility this date, no balance deficits with turns, negotiating hallways, or sit to stand transfers this date. He was able to ambulate a full lap around RN station ~ 200 feet without becoming dyspnic or complaining of fatigue and appears to have returned to his baseline. No further PT needs after acute hospitalization indicated at this time.     Follow Up Recommendations No PT follow up    Equipment Recommendations       Recommendations for Other Services       Precautions / Restrictions Precautions Precautions: Fall Restrictions Weight Bearing Restrictions: No      Mobility  Bed Mobility Overal bed mobility: Independent             General bed mobility comments: No deficits observed  Transfers Overall transfer level: Independent Equipment used: None             General transfer comment: Patient pushes off from bed with UEs, no loss of balance observed.  Ambulation/Gait Ambulation/Gait assistance: Supervision Gait Distance (Feet): 200 Feet Assistive device: None Gait Pattern/deviations: WFL(Within Functional Limits)   Gait velocity interpretation: 1.31 - 2.62 ft/sec, indicative of limited community ambulator General Gait Details: No obvious deficits or loss of balance observed.  Stairs            Wheelchair Mobility    Modified Rankin  (Stroke Patients Only)       Balance Overall balance assessment: No apparent balance deficits (not formally assessed)                                           Pertinent Vitals/Pain Pain Assessment: No/denies pain    Home Living Family/patient expects to be discharged to:: Private residence Living Arrangements: Alone Available Help at Discharge: Neighbor;Friend(s) Type of Home: House Home Access: Stairs to enter Entrance Stairs-Rails: Left Entrance Stairs-Number of Steps: 3 STE Home Layout: One level Home Equipment: Walker - 2 wheels;Cane - single point;Toilet riser      Prior Function Level of Independence: Independent         Comments: Indep without assist device for basic transfers and gait; denies fall history.  Has access to [facility] and RW if needed, but does "not want to rely on that".  Home O2 at 3L     Hand Dominance   Dominant Hand: Right    Extremity/Trunk Assessment   Upper Extremity Assessment Upper Extremity Assessment: Overall WFL for tasks assessed    Lower Extremity Assessment Lower Extremity Assessment: Overall WFL for tasks assessed       Communication   Communication: HOH  Cognition Arousal/Alertness: Awake/alert Behavior During Therapy: WFL for tasks assessed/performed;Impulsive Overall Cognitive Status: Within Functional Limits for tasks assessed  General Comments      Exercises Other Exercises Other Exercises: Assisted patient with cuing and supervision for use of commode (Ther Act) -- verbal cuing required, patient able to transfer to and from commode with cga x 1.   Assessment/Plan    PT Assessment Patent does not need any further PT services  PT Problem List         PT Treatment Interventions      PT Goals (Current goals can be found in the Care Plan section)  Acute Rehab PT Goals Patient Stated Goal: To return home safely PT Goal Formulation: With  patient Time For Goal Achievement: 06/07/19 Potential to Achieve Goals: Good    Frequency     Barriers to discharge        Co-evaluation               AM-PAC PT "6 Clicks" Mobility  Outcome Measure Help needed turning from your back to your side while in a flat bed without using bedrails?: None Help needed moving from lying on your back to sitting on the side of a flat bed without using bedrails?: None Help needed moving to and from a bed to a chair (including a wheelchair)?: None Help needed standing up from a chair using your arms (e.g., wheelchair or bedside chair)?: None Help needed to walk in hospital room?: None Help needed climbing 3-5 steps with a railing? : None 6 Click Score: 24    End of Session Equipment Utilized During Treatment: Gait belt Activity Tolerance: Patient tolerated treatment well Patient left: in chair;with chair alarm set;with call bell/phone within reach Nurse Communication: Mobility status PT Visit Diagnosis: Difficulty in walking, not elsewhere classified (R26.2)    Time: 7092-9574 PT Time Calculation (min) (ACUTE ONLY): 17 min   Charges:   PT Evaluation $PT Eval Low Complexity: 1 Low PT Treatments $Therapeutic Activity: 8-22 mins    Royce Macadamia PT, DPT, CSCS      05/24/2019, 4:20 PM

## 2019-05-25 LAB — GLUCOSE, CAPILLARY: Glucose-Capillary: 142 mg/dL — ABNORMAL HIGH (ref 70–99)

## 2019-05-25 MED ORDER — AMIODARONE HCL 200 MG PO TABS: 200.0000 mg | ORAL_TABLET | Freq: Two times a day (BID) | ORAL | 0 refills | Status: DC

## 2019-05-25 MED ORDER — FERROUS SULFATE 325 (65 FE) MG PO TABS: 325.0000 mg | ORAL_TABLET | Freq: Two times a day (BID) | ORAL | 3 refills | Status: DC

## 2019-05-25 MED ORDER — CEFDINIR 300 MG PO CAPS: 300.0000 mg | ORAL_CAPSULE | Freq: Two times a day (BID) | ORAL | 0 refills | Status: DC

## 2019-05-25 NOTE — Discharge Summary (Signed)
[facility] [place] NAME: Brett [name]    MR#:  [id number]  DATE OF BIRTH:  02-22-1935  DATE OF ADMISSION:  05/22/2019 ADMITTING PHYSICIAN: Loletha Grayer, MD  DATE OF DISCHARGE: 05/25/2019  PRIMARY CARE PHYSICIAN: Perrin Maltese, MD    ADMISSION DIAGNOSIS:  Acute respiratory failure with hypoxia ([place]) [J96.01] Sepsis due to pneumonia ([place]) [J18.9, A41.9]  DISCHARGE DIAGNOSIS:  Acute on chronic  Hypoxic respiratory failure --pt on chronic oxygen Acute bronchitis Anemia--iron deficiency Clinical sepsis on admission--resovled  SECONDARY DIAGNOSIS:   Past Medical History:  Diagnosis Date  . Anemia   . Aortic regurgitation   . B-cell lymphoma ([place]) 2009   DX AT [facility]  . Bronchitis   . CAD (coronary artery disease)   . Carotid stenosis   . CHF (congestive heart failure) ([place])   . Diabetes mellitus without complication ([place])   . Diabetes mellitus, type 2 ([place])   . Emphysema of lung ([place])   . Essential hypertension   . History of chemotherapy   . Hyperlipidemia   . Hypertension   . IDA (iron deficiency anemia)   . Leg edema   . Meralgia paraesthetica   . Mitral regurgitation   . PUD (peptic ulcer disease)   . PVD (peripheral vascular disease) ([place])   . Tobacco abuse     HOSPITAL COURSE:   HerbertGrunerdis a84 y.o.malepatient coming in with shortness of breath. Patient is hard of hearing so difficult historian. He complains of some soreness in his left chest and left upper abdomen. He states he is short of breath when sitting up or lying down. He felt some cold chills.  1. Clinical sepsis on admission--resolved -Fever and tachycardia--improved - COVID-19 negative.  -Chest x-ray did not show pneumonia but I am suspecting acute  bronchitis--change to po ceftin -procalcitonin 0.69  2. Atrial fibrillation with rapid ventricular response.  -Patient took his metoprolol already today.  -Also  received IV magnesium.  - now started on po oral amiodarone.  -Cardiology consultation with Dr. Ceasar Mons. Heparin drip started now d/ced given significant anemia and patient not having chest pain and troponin not rising. -No acute EKG changes. Patient remains controlled heart rate 80s on telemetry -cardiology workup can be considered as outpatient. -Echo EF 60-65%  3. Type 2 diabetes mellitus.  -cont home meds  4. COPD on with chronic respiratory failure on 2 L of oxygen.  - on nebulizer treatments  -patient received IV Solu-Medrol in ER -He is currently not wheezing.  -Continue PRN breathing treatment  5. HTN  -Continue metoprolol, hydralazine and norvasc  6. Chronic kidney disease stage III. Continue to monitor closely here in the hospital. -Creatinine 2.12--2.0 - Avoid nephrotoxic's. Monitor input output  7. Anemia of chronic disease. Serum iron 11.  Will give IV venofer and po ferrous sulfate. Patient will follow-up with hematology Dr. Tasia Catchings as outpatient. -vitamin B12 ok 259 -patient due to difficulty hearing does not remember if he had any G.I. workup done. However given his heart and lung condition he would be at a high risk for these procedures. Currently no active bleeding will manage conservatively  8. Hyperlipidemia unspecified on Crestor   PT recommends no  PT needs  D/c today  CODE STATUS: full  DVT Prophylaxis: SCD CONSULTS OBTAINED:  Treatment Team:  Dionisio David, MD  DRUG ALLERGIES:  No Known Allergies  DISCHARGE MEDICATIONS:   Allergies as of 05/25/2019   No Known Allergies  Medication List    STOP taking these medications   blood glucose meter kit and supplies Kit     TAKE these medications   albuterol 108 (90 Base) MCG/ACT inhaler Commonly known as: VENTOLIN HFA Inhale 2 puffs into the lungs every 6 (six) hours as needed for wheezing or shortness of breath.   amiodarone 200 MG tablet Commonly known as:  PACERONE Take 1 tablet (200 mg total) by mouth 2 (two) times daily.   amLODipine 5 MG tablet Commonly known as: NORVASC Take 5 mg by mouth daily.   cefdinir 300 MG capsule Commonly known as: OMNICEF Take 1 capsule (300 mg total) by mouth every 12 (twelve) hours.   clopidogrel 75 MG tablet Commonly known as: PLAVIX Take 75 mg by mouth daily.   ferrous sulfate 325 (65 FE) MG tablet Take 1 tablet (325 mg total) by mouth 2 (two) times daily with a meal.   gabapentin 100 MG capsule Commonly known as: NEURONTIN Take 1 capsule by mouth 2 (two) times daily.   glimepiride 2 MG tablet Commonly known as: AMARYL Take 1 tablet (2 mg total) by mouth 2 (two) times daily.   hydrALAZINE 100 MG tablet Commonly known as: APRESOLINE Take 100 mg by mouth 2 (two) times daily.   ipratropium-albuterol 0.5-2.5 (3) MG/3ML Soln Commonly known as: DUONEB Take 3 mLs by nebulization every 6 (six) hours as needed.   isosorbide mononitrate 30 MG 24 hr tablet Commonly known as: IMDUR Take 30 mg by mouth daily.   metoprolol 200 MG 24 hr tablet Commonly known as: TOPROL-XL Take 1 tablet (200 mg total) by mouth daily.   mometasone-formoterol 100-5 MCG/ACT Aero Commonly known as: DULERA Inhale 2 puffs into the lungs 2 (two) times daily. What changed:   when to take this  reasons to take this   pantoprazole 40 MG tablet Commonly known as: PROTONIX Take 40 mg by mouth daily.   rosuvastatin 40 MG tablet Commonly known as: CRESTOR Take 40 mg by mouth daily.   tiotropium 18 MCG inhalation capsule Commonly known as: Spiriva HandiHaler Place 1 capsule (18 mcg total) into inhaler and inhale daily.   Vitamin D (Ergocalciferol) 1.25 MG (50000 UT) Caps capsule Commonly known as: DRISDOL Take 50,000 Units by mouth once a week.       If you experience worsening of your admission symptoms, develop shortness of breath, life threatening emergency, suicidal or homicidal thoughts you must seek medical  attention immediately by calling 911 or calling your MD immediately  if symptoms less severe.  You Must read complete instructions/literature along with all the possible adverse reactions/side effects for all the Medicines you take and that have been prescribed to you. Take any new Medicines after you have completely understood and accept all the possible adverse reactions/side effects.   Please note  You were cared for by a hospitalist during your hospital stay. If you have any questions about your discharge medications or the care you received while you were in the hospital after you are discharged, you can call the unit and asked to speak with the hospitalist on call if the hospitalist that took care of you is not available. Once you are discharged, your primary care physician will handle any further medical issues. Please note that NO REFILLS for any discharge medications will be authorized once you are discharged, as it is imperative that you return to your primary care physician (or establish a relationship with a primary care physician if you do not have  one) for your aftercare needs so that they can reassess your need for medications and monitor your lab values. Today   SUBJECTIVE   Feels ok  VITAL SIGNS:  Blood pressure (!) 154/74, pulse 96, temperature 98.1 F (36.7 C), temperature source Oral, resp. rate 20, height _0  (1.778 m), weight 90.5 kg, SpO2 97 %.  I/O:    Intake/Output Summary (Last 24 hours) at 05/25/2019 0902 Last data filed at 05/25/2019 0519 Gross per 24 hour  Intake 720 ml  Output 1325 ml  Net -605 ml    PHYSICAL EXAMINATION:  GENERAL:  84 y.o.-year-old patient lying in the bed with no acute distress.  EYES: Pupils equal, round, reactive to light and accommodation. No scleral icterus. Extraocular muscles intact.  HEENT: Head atraumatic, normocephalic. Oropharynx and nasopharynx clear.  NECK:  Supple, no jugular venous distention. No thyroid enlargement, no  tenderness.  LUNGS: Normal breath sounds bilaterally, no wheezing, rales,rhonchi or crepitation. No use of accessory muscles of respiration.  CARDIOVASCULAR: S1, S2 normal. No murmurs, rubs, or gallops. Mild tachy ABDOMEN: Soft, non-tender, non-distended. Bowel sounds present. No organomegaly or mass.  EXTREMITIES: No pedal edema, cyanosis, or clubbing.  NEUROLOGIC: Cranial nerves II through XII are intact. Muscle strength 5/5 in all extremities. Sensation intact. Gait not checked.  PSYCHIATRIC: The patient is alert and oriented x 3.  SKIN: No obvious rash, lesion, or ulcer.   DATA REVIEW:   CBC  Recent Labs  Lab 05/24/19 0350  WBC 15.1*  HGB 9.3*  HCT 30.5*  PLT 208    Chemistries  Recent Labs  Lab 05/22/19 1117  05/24/19 0350  NA 136   < > 142  K 3.8   < > 4.5  CL 100   < > 103  CO2 27   < > 28  GLUCOSE 183*   < > 150*  BUN 25*   < > 48*  CREATININE 1.64*   < > 2.08*  CALCIUM 8.3*   < > 8.7*  AST 16  --   --   ALT 11  --   --   ALKPHOS 75  --   --   BILITOT 0.5  --   --    < > = values in this interval not displayed.    Microbiology Results   Recent Results (from the past 240 hour(s))  Blood Culture (routine x 2)     Status: None (Preliminary result)   Collection Time: 05/22/19 11:17 AM   Specimen: BLOOD  Result Value Ref Range Status   Specimen Description BLOOD RIGHT ANTECUBITAL  Final   Special Requests NONE  Final   Culture   Final    NO GROWTH 3 DAYS Performed at [facility], [address], [place], [place] [address]    Report Status PENDING  Incomplete  SARS Coronavirus 2 (CEPHEID- Performed in [facility] hospital lab), Hosp Order     Status: None   Collection Time: 05/22/19 11:18 AM   Specimen: Nasopharyngeal Swab  Result Value Ref Range Status   SARS Coronavirus 2 NEGATIVE NEGATIVE Final    Comment: (NOTE) If result is NEGATIVE SARS-CoV-2 target nucleic acids are NOT DETECTED. The SARS-CoV-2 RNA is generally detectable in upper and  lower  respiratory specimens during the acute phase of infection. The lowest  concentration of SARS-CoV-2 viral copies this assay can detect is 250  copies / mL. A negative result does not preclude SARS-CoV-2 infection  and should not be used as the sole  basis for treatment or other  patient management decisions.  A negative result may occur with  improper specimen collection / handling, submission of specimen other  than nasopharyngeal swab, presence of viral mutation(s) within the  areas targeted by this assay, and inadequate number of viral copies  (<250 copies / mL). A negative result must be combined with clinical  observations, patient history, and epidemiological information. If result is POSITIVE SARS-CoV-2 target nucleic acids are DETECTED. The SARS-CoV-2 RNA is generally detectable in upper and lower  respiratory specimens dur ing the acute phase of infection.  Positive  results are indicative of active infection with SARS-CoV-2.  Clinical  correlation with patient history and other diagnostic information is  necessary to determine patient infection status.  Positive results do  not rule out bacterial infection or co-infection with other viruses. If result is PRESUMPTIVE POSTIVE SARS-CoV-2 nucleic acids MAY BE PRESENT.   A presumptive positive result was obtained on the submitted specimen  and confirmed on repeat testing.  While 2019 novel coronavirus  (SARS-CoV-2) nucleic acids may be present in the submitted sample  additional confirmatory testing may be necessary for epidemiological  and / or clinical management purposes  to differentiate between  SARS-CoV-2 and other Sarbecovirus currently known to infect humans.  If clinically indicated additional testing with an alternate test  methodology [phone]) is advised. The SARS-CoV-2 RNA is generally  detectable in upper and lower respiratory sp ecimens during the acute  phase of infection. The expected result is  Negative. Fact Sheet for Patients:  StrictlyIdeas.no Fact Sheet for Healthcare Providers: BankingDealers.co.za This test is not yet approved or cleared by the [place] FDA and has been authorized for detection and/or diagnosis of SARS-CoV-2 by FDA under an Emergency Use Authorization (EUA).  This EUA will remain in effect (meaning this test can be used) for the duration of the COVID-19 declaration under Section 564(b)(1) of the Act, 21 U.S.C. section 360bbb-3(b)(1), unless the authorization is terminated or revoked sooner. Performed at [facility], [address] [place], [place] [address]   Blood Culture (routine x 2)     Status: None (Preliminary result)   Collection Time: 05/22/19 12:29 PM   Specimen: BLOOD  Result Value Ref Range Status   Specimen Description   Final    BLOOD BLOOD RIGHT HAND Blood Culture results may not be optimal due to an excessive volume of blood received in culture bottles   Special Requests NONE  Final   Culture   Final    NO GROWTH 3 DAYS Performed at [facility], [address], [place], [place] [address]    Report Status PENDING  Incomplete  Urine Culture     Status: None   Collection Time: 05/23/19  8:55 AM   Specimen: Urine, Random  Result Value Ref Range Status   Specimen Description   Final    URINE, RANDOM Performed at [facility], [address], [place], [place] [address]    Special Requests   Final    Normal Performed at [facility], [address], [place], [place] [address]    Culture   Final    NO GROWTH Performed at [facility], [place] [address], [place], [place] [address]    Report Status 05/24/2019 FINAL  Final    RADIOLOGY:  No results found.   CODE STATUS:     Code Status Orders  (From admission, onward)         Start  Ordered   05/23/19 1909  Do not attempt resuscitation (DNR)  Continuous    Question Answer  Comment  In the event of cardiac or respiratory ARREST Do not call a "code blue"   In the event of cardiac or respiratory ARREST Do not perform Intubation, CPR, defibrillation or ACLS   In the event of cardiac or respiratory ARREST Use medication by any route, position, wound care, and other measures to relive pain and suffering. May use oxygen, suction and manual treatment of airway obstruction as needed for comfort.      05/23/19 1908        Code Status History    Date Active Date Inactive Code Status Order ID Comments User Context   05/22/2019 1357 05/23/2019 1908 Full Code [id number]  Loletha Grayer, MD ED   02/09/2019 0410 02/12/2019 1734 Full Code [id number]  Arta Silence, MD Inpatient   01/21/2019 1852 01/23/2019 1954 DNR [id number]  Henreitta Leber, MD Inpatient   08/24/2018 0149 08/25/2018 1536 DNR [id number]  Amelia Jo, MD Inpatient   05/29/2018 1513 05/31/2018 1617 DNR [id number]  Gladstone Lighter, MD Inpatient   12/30/2017 1815 01/01/2018 1453 Full Code [id number]  Vaughan Basta, MD Inpatient   12/05/2017 2146 12/08/2017 1428 Full Code [id number]  Fritzi Mandes, MD Inpatient   11/13/2017 2123 11/16/2017 1704 Full Code [id number]  Idelle Crouch, MD Inpatient   09/22/2017 0036 09/24/2017 1837 Full Code [id number]  Salary, Avel Peace, MD Inpatient   09/22/2017 0033 09/22/2017 0036 DNR [id number]  Salary, Avel Peace, MD ED   12/02/2016 0812 12/03/2016 2009 Full Code [id number]  Vaughan Basta, MD Inpatient   07/11/2016 0018 07/11/2016 1707 Full Code [id number]  Lance Coon, MD Inpatient   06/18/2016 0340 06/21/2016 1844 Full Code [id number]  Saundra Shelling, MD ED   Advance Care Planning Activity      TOTAL TIME TAKING CARE OF THIS PATIENT: *40* minutes.    Fritzi Mandes M.D on 05/25/2019 at 9:02 AM  Between 7am to 6pm - Pager - [phone] After 6pm go to [email or web address] - password [email or web address] [facility]  Office  [phone]  CC: Primary care physician; Perrin Maltese, MD

## 2019-05-25 NOTE — Care Management Important Message (Signed)
Important Message  Patient Details  Name: Brett [name] MRN: [id number] Date of Birth: [birth year]   Medicare Important Message Given:  Yes     Dannette Barbara 05/25/2019, 10:57 AM

## 2019-05-25 NOTE — Plan of Care (Signed)
Pt ready for discharge home.   Problem: Activity: Goal: Ability to tolerate increased activity will improve Outcome: Completed/Met   Problem: Clinical Measurements: Goal: Ability to maintain a body temperature in the normal range will improve Outcome: Completed/Met   Problem: Respiratory: Goal: Ability to maintain adequate ventilation will improve Outcome: Completed/Met Goal: Ability to maintain a clear airway will improve Outcome: Completed/Met   Problem: Education: Goal: Knowledge of disease or condition will improve Outcome: Completed/Met Goal: Understanding of medication regimen will improve Outcome: Completed/Met Goal: Individualized Educational Video(s) Outcome: Completed/Met   Problem: Activity: Goal: Ability to tolerate increased activity will improve Outcome: Completed/Met   Problem: Cardiac: Goal: Ability to achieve and maintain adequate cardiopulmonary perfusion will improve Outcome: Completed/Met   Problem: Health Behavior/Discharge Planning: Goal: Ability to safely manage health-related needs after discharge will improve Outcome: Completed/Met   Problem: Education: Goal: Knowledge of General Education information will improve Description: Including pain rating scale, medication(s)/side effects and non-pharmacologic comfort measures Outcome: Completed/Met   Problem: Health Behavior/Discharge Planning: Goal: Ability to manage health-related needs will improve Outcome: Completed/Met   Problem: Clinical Measurements: Goal: Ability to maintain clinical measurements within normal limits will improve Outcome: Completed/Met Goal: Will remain free from infection Outcome: Completed/Met Goal: Diagnostic test results will improve Outcome: Completed/Met Goal: Respiratory complications will improve Outcome: Completed/Met Goal: Cardiovascular complication will be avoided Outcome: Completed/Met   Problem: Activity: Goal: Risk for activity intolerance will  decrease Outcome: Completed/Met   Problem: Nutrition: Goal: Adequate nutrition will be maintained Outcome: Completed/Met   Problem: Coping: Goal: Level of anxiety will decrease Outcome: Completed/Met   Problem: Elimination: Goal: Will not experience complications related to bowel motility Outcome: Completed/Met Goal: Will not experience complications related to urinary retention Outcome: Completed/Met   Problem: Pain Managment: Goal: General experience of comfort will improve Outcome: Completed/Met   Problem: Safety: Goal: Ability to remain free from injury will improve Outcome: Completed/Met   Problem: Skin Integrity: Goal: Risk for impaired skin integrity will decrease Outcome: Completed/Met   Problem: Acute Rehab PT Goals(only PT should resolve) Goal: Pt Will Ambulate Outcome: Completed/Met

## 2019-05-25 NOTE — Progress Notes (Signed)
SUBJECTIVE: Patient is feeling well, no chest pain or shortness of breath.   Vitals:   05/25/19 0520 05/25/19 0821 05/25/19 0824 05/25/19 0829  BP: (!) 149/71  (!) 154/74 (!) 154/74  Pulse: 93  93 96  Resp: 16  20 20   Temp: 97.7 F (36.5 C)  98.1 F (36.7 C) 98.1 F (36.7 C)  TempSrc: Oral  Oral Oral  SpO2: 96% 92% 99% 97%  Weight:      Height:        Intake/Output Summary (Last 24 hours) at 05/25/2019 0907 Last data filed at 05/25/2019 0519 Gross per 24 hour  Intake 720 ml  Output 1325 ml  Net -605 ml    LABS: Basic Metabolic Panel: Recent Labs    05/23/19 0648 05/24/19 0350  NA 137 142  K 4.3 4.5  CL 100 103  CO2 27 28  GLUCOSE 281* 150*  BUN 36* 48*  CREATININE 2.12* 2.08*  CALCIUM 8.1* 8.7*   Liver Function Tests: Recent Labs    05/22/19 1117  AST 16  ALT 11  ALKPHOS 75  BILITOT 0.5  PROT 7.2  ALBUMIN 3.7   No results for input(s): LIPASE, AMYLASE in the last 72 hours. CBC: Recent Labs    05/22/19 1117 05/23/19 0648 05/23/19 1322 05/24/19 0350  WBC 12.9* 14.0*  --  15.1*  NEUTROABS 10.9*  --   --   --   HGB 10.7* 8.7* 9.6* 9.3*  HCT 34.0* 28.1*  --  30.5*  MCV 85.4 85.4  --  85.7  PLT 201 172  --  208   Cardiac Enzymes: No results for input(s): CKTOTAL, CKMB, CKMBINDEX, TROPONINI in the last 72 hours. BNP: Invalid input(s): POCBNP D-Dimer: No results for input(s): DDIMER in the last 72 hours. Hemoglobin A1C: Recent Labs    05/23/19 0054  HGBA1C 7.2*   Fasting Lipid Panel: No results for input(s): CHOL, HDL, LDLCALC, TRIG, CHOLHDL, LDLDIRECT in the last 72 hours. Thyroid Function Tests: No results for input(s): TSH, T4TOTAL, T3FREE, THYROIDAB in the last 72 hours.  Invalid input(s): FREET3 Anemia Panel: Recent Labs    05/23/19 0648  VITAMINB12 259  TIBC 257  IRON 11*     PHYSICAL EXAM General: Well developed, well nourished, in no acute distress HEENT:  Normocephalic and atramatic Neck:  No JVD.  Lungs: Clear  bilaterally to auscultation and percussion. Heart: HRRR . Normal S1 and S2 without gallops or murmurs.  Abdomen: Bowel sounds are positive, abdomen soft and non-tender  Msk:  Back normal, normal gait. Normal strength and tone for age. Extremities: No clubbing, cyanosis or edema.   Neuro: Alert and oriented X 3. Psych:  Good affect, responds appropriately  TELEMETRY: Sinus tachycardia 103bpm  ASSESSMENT AND PLAN: Status post new onset atrial fibrillation with history of mild CAD on CTA coronaries and mildly elevated troponin due to type II myocardial infarction.  Will consider anticoagulation in outpatient follow up due to questionable GI bleed and drop in Hgb. At this time is reasonable to withhold anticoagulants.  Follow up with Dr. Neoma Laming Monday June 29.   Active Problems:   Atrial fibrillation with RVR (HCC)    Jake Bathe, MD, [facility] 05/25/2019 9:07 AM

## 2019-05-25 NOTE — TOC Transition Note (Signed)
Transition of Care [facility]) - CM/SW Discharge Note   Patient Details  Name: Brett [name] MRN: [id number] Date of Birth: June 16, 1935  [facility]) CM/SW Contact:  Elza Rafter, RN Phone Number: 05/25/2019, 10:31 AM   Clinical Narrative:   Patient is discharging to home today.  Resumption of home health orders needed for RN.  Open to [place].  MD aware.  Notified [name] with [facility] of discharge.     Final next level of care: [place] Barriers to Discharge: No Barriers Identified   Patient Goals and CMS Choice        Discharge Placement                       Discharge Plan and Services                          HH Arranged: RN [facility] Agency: [place] ([facility]) Date HH Agency Contacted: 05/25/19 Time [place]: 1031 Representative spoke with at [place]: [place] ([place]) Interventions     Readmission Risk Interventions No flowsheet data found.

## 2019-05-27 LAB — CULTURE, BLOOD (ROUTINE X 2)
Culture: NO GROWTH
Culture: NO GROWTH

## 2019-05-29 ENCOUNTER — Ambulatory Visit: Payer: Self-pay | Admitting: Cardiovascular Disease

## 2019-06-07 ENCOUNTER — Telehealth: Payer: Self-pay | Admitting: *Deleted

## 2019-06-07 NOTE — Telephone Encounter (Signed)
Claudette Head (Friend) called to cancel 06/08/19 MD appt... He stated that pt was out of town and that they would call back to R/S.

## 2019-06-08 ENCOUNTER — Inpatient Hospital Stay: Payer: Medicare PPO | Admitting: Oncology

## 2019-07-14 ENCOUNTER — Emergency Department: Payer: Medicare Other

## 2019-07-14 ENCOUNTER — Other Ambulatory Visit: Payer: Self-pay

## 2019-07-14 ENCOUNTER — Encounter: Payer: Self-pay | Admitting: Internal Medicine

## 2019-07-14 ENCOUNTER — Inpatient Hospital Stay
Admission: EM | Admit: 2019-07-14 | Discharge: 2019-07-16 | DRG: 190 | Disposition: A | Discharge status: Home Health | Payer: Medicare Other | Attending: Internal Medicine | Admitting: Internal Medicine

## 2019-07-14 DIAGNOSIS — R0602 Shortness of breath: Secondary | ICD-10-CM

## 2019-07-14 DIAGNOSIS — R911 Solitary pulmonary nodule: Secondary | ICD-10-CM | POA: Diagnosis present

## 2019-07-14 DIAGNOSIS — Z20828 Contact with and (suspected) exposure to other viral communicable diseases: Secondary | ICD-10-CM | POA: Diagnosis not present

## 2019-07-14 DIAGNOSIS — J189 Pneumonia, unspecified organism: Secondary | ICD-10-CM | POA: Diagnosis present

## 2019-07-14 DIAGNOSIS — Z7984 Long term (current) use of oral hypoglycemic drugs: Secondary | ICD-10-CM

## 2019-07-14 DIAGNOSIS — D509 Iron deficiency anemia, unspecified: Secondary | ICD-10-CM | POA: Diagnosis present

## 2019-07-14 DIAGNOSIS — Z8572 Personal history of non-Hodgkin lymphomas: Secondary | ICD-10-CM | POA: Diagnosis not present

## 2019-07-14 DIAGNOSIS — Z9981 Dependence on supplemental oxygen: Secondary | ICD-10-CM | POA: Diagnosis not present

## 2019-07-14 DIAGNOSIS — N183 Chronic kidney disease, stage 3 (moderate): Secondary | ICD-10-CM | POA: Diagnosis not present

## 2019-07-14 DIAGNOSIS — E785 Hyperlipidemia, unspecified: Secondary | ICD-10-CM | POA: Diagnosis not present

## 2019-07-14 DIAGNOSIS — I5032 Chronic diastolic (congestive) heart failure: Secondary | ICD-10-CM | POA: Diagnosis not present

## 2019-07-14 DIAGNOSIS — J440 Chronic obstructive pulmonary disease with acute lower respiratory infection: Secondary | ICD-10-CM | POA: Diagnosis present

## 2019-07-14 DIAGNOSIS — Z8249 Family history of ischemic heart disease and other diseases of the circulatory system: Secondary | ICD-10-CM

## 2019-07-14 DIAGNOSIS — B369 Superficial mycosis, unspecified: Secondary | ICD-10-CM | POA: Diagnosis present

## 2019-07-14 DIAGNOSIS — E1165 Type 2 diabetes mellitus with hyperglycemia: Secondary | ICD-10-CM | POA: Diagnosis not present

## 2019-07-14 DIAGNOSIS — T380X5A Adverse effect of glucocorticoids and synthetic analogues, initial encounter: Secondary | ICD-10-CM | POA: Diagnosis not present

## 2019-07-14 DIAGNOSIS — I130 Hypertensive heart and chronic kidney disease with heart failure and stage 1 through stage 4 chronic kidney disease, or unspecified chronic kidney disease: Secondary | ICD-10-CM | POA: Diagnosis present

## 2019-07-14 DIAGNOSIS — L989 Disorder of the skin and subcutaneous tissue, unspecified: Secondary | ICD-10-CM | POA: Diagnosis present

## 2019-07-14 DIAGNOSIS — Z87891 Personal history of nicotine dependence: Secondary | ICD-10-CM

## 2019-07-14 DIAGNOSIS — I10 Essential (primary) hypertension: Secondary | ICD-10-CM

## 2019-07-14 DIAGNOSIS — J9610 Chronic respiratory failure, unspecified whether with hypoxia or hypercapnia: Secondary | ICD-10-CM | POA: Diagnosis not present

## 2019-07-14 DIAGNOSIS — B351 Tinea unguium: Secondary | ICD-10-CM | POA: Diagnosis present

## 2019-07-14 DIAGNOSIS — E1151 Type 2 diabetes mellitus with diabetic peripheral angiopathy without gangrene: Secondary | ICD-10-CM | POA: Diagnosis not present

## 2019-07-14 DIAGNOSIS — Z7902 Long term (current) use of antithrombotics/antiplatelets: Secondary | ICD-10-CM

## 2019-07-14 DIAGNOSIS — Z9221 Personal history of antineoplastic chemotherapy: Secondary | ICD-10-CM | POA: Diagnosis not present

## 2019-07-14 DIAGNOSIS — J441 Chronic obstructive pulmonary disease with (acute) exacerbation: Secondary | ICD-10-CM

## 2019-07-14 DIAGNOSIS — E1122 Type 2 diabetes mellitus with diabetic chronic kidney disease: Secondary | ICD-10-CM | POA: Diagnosis not present

## 2019-07-14 DIAGNOSIS — I480 Paroxysmal atrial fibrillation: Secondary | ICD-10-CM | POA: Diagnosis present

## 2019-07-14 DIAGNOSIS — Z8673 Personal history of transient ischemic attack (TIA), and cerebral infarction without residual deficits: Secondary | ICD-10-CM

## 2019-07-14 DIAGNOSIS — Z79899 Other long term (current) drug therapy: Secondary | ICD-10-CM

## 2019-07-14 LAB — BASIC METABOLIC PANEL
Anion gap: 9 (ref 5–15)
BUN: 29 mg/dL — ABNORMAL HIGH (ref 8–23)
CO2: 28 mmol/L (ref 22–32)
Calcium: 8.6 mg/dL — ABNORMAL LOW (ref 8.9–10.3)
Chloride: 105 mmol/L (ref 98–111)
Creatinine, Ser: 1.88 mg/dL — ABNORMAL HIGH (ref 0.61–1.24)
GFR calc Af Amer: 38 mL/min — ABNORMAL LOW (ref >60)
GFR calc non Af Amer: 33 mL/min — ABNORMAL LOW (ref >60)
Glucose, Bld: 181 mg/dL — ABNORMAL HIGH (ref 70–99)
Potassium: 3.9 mmol/L (ref 3.5–5.1)
Sodium: 142 mmol/L (ref 135–145)

## 2019-07-14 LAB — GLUCOSE, CAPILLARY
Glucose-Capillary: 302 mg/dL — ABNORMAL HIGH (ref 70–99)
Glucose-Capillary: 340 mg/dL — ABNORMAL HIGH (ref 70–99)
Glucose-Capillary: 347 mg/dL — ABNORMAL HIGH (ref 70–99)

## 2019-07-14 LAB — CBC WITH DIFFERENTIAL/PLATELET
Abs Immature Granulocytes: 0.05 10*3/uL (ref 0.00–0.07)
Basophils Absolute: 0 10*3/uL (ref 0.0–0.1)
Basophils Relative: 0 %
Eosinophils Absolute: 0.3 10*3/uL (ref 0.0–0.5)
Eosinophils Relative: 3 %
HCT: 32.4 % — ABNORMAL LOW (ref 39.0–52.0)
Hemoglobin: 10.1 g/dL — ABNORMAL LOW (ref 13.0–17.0)
Immature Granulocytes: 1 %
Lymphocytes Relative: 12 %
Lymphs Abs: 1.1 10*3/uL (ref 0.7–4.0)
MCH: 27.4 pg (ref 26.0–34.0)
MCHC: 31.2 g/dL (ref 30.0–36.0)
MCV: 88 fL (ref 80.0–100.0)
Monocytes Absolute: 1.3 10*3/uL — ABNORMAL HIGH (ref 0.1–1.0)
Monocytes Relative: 14 %
Neutro Abs: 6.2 10*3/uL (ref 1.7–7.7)
Neutrophils Relative %: 70 %
Platelets: 160 10*3/uL (ref 150–400)
RBC: 3.68 MIL/uL — ABNORMAL LOW (ref 4.22–5.81)
RDW: 15.9 % — ABNORMAL HIGH (ref 11.5–15.5)
WBC: 8.9 10*3/uL (ref 4.0–10.5)
nRBC: 0 % (ref 0.0–0.2)

## 2019-07-14 LAB — SARS CORONAVIRUS 2 BY RT PCR (HOSPITAL ORDER, PERFORMED IN ~~LOC~~ HOSPITAL LAB): SARS Coronavirus 2: NEGATIVE

## 2019-07-14 LAB — BRAIN NATRIURETIC PEPTIDE: B Natriuretic Peptide: 190 pg/mL — ABNORMAL HIGH (ref 0.0–100.0)

## 2019-07-14 LAB — PROCALCITONIN: Procalcitonin: <0.1 ng/mL

## 2019-07-14 LAB — TROPONIN I (HIGH SENSITIVITY)
Troponin I (High Sensitivity): 24 ng/L — ABNORMAL HIGH (ref <18)
Troponin I (High Sensitivity): 25 ng/L — ABNORMAL HIGH (ref <18)

## 2019-07-14 MED ORDER — METHYLPREDNISOLONE SODIUM SUCC 40 MG IJ SOLR
40.0000 mg | Freq: Two times a day (BID) | INTRAMUSCULAR | Status: DC
  Administered 2019-07-14 – 2019-07-15 (×3): 40 mg via INTRAVENOUS
  Filled 2019-07-14 (×3): qty 1

## 2019-07-14 MED ORDER — TERBINAFINE HCL 1 % EX CREA
TOPICAL_CREAM | Freq: Two times a day (BID) | CUTANEOUS | Status: DC
  Administered 2019-07-14: 22:00:00 via TOPICAL
  Administered 2019-07-15: 1 via TOPICAL
  Administered 2019-07-15: 22:00:00 via TOPICAL
  Filled 2019-07-14: qty 12

## 2019-07-14 MED ORDER — AZITHROMYCIN 500 MG PO TABS
500.0000 mg | ORAL_TABLET | Freq: Every day | ORAL | Status: AC
  Administered 2019-07-14: 500 mg via ORAL
  Filled 2019-07-14: qty 1

## 2019-07-14 MED ORDER — GABAPENTIN 100 MG PO CAPS
100.0000 mg | ORAL_CAPSULE | Freq: Two times a day (BID) | ORAL | Status: DC
  Administered 2019-07-14 – 2019-07-16 (×5): 100 mg via ORAL
  Filled 2019-07-14 (×5): qty 1

## 2019-07-14 MED ORDER — AMMONIUM LACTATE 12 % EX LOTN
TOPICAL_LOTION | Freq: Two times a day (BID) | CUTANEOUS | Status: DC
  Administered 2019-07-14 – 2019-07-16 (×4): via TOPICAL
  Filled 2019-07-14: qty 400

## 2019-07-14 MED ORDER — BUDESONIDE 0.5 MG/2ML IN SUSP
0.5000 mg | Freq: Two times a day (BID) | RESPIRATORY_TRACT | Status: DC
  Administered 2019-07-14 – 2019-07-16 (×4): 0.5 mg via RESPIRATORY_TRACT
  Filled 2019-07-14 (×4): qty 2

## 2019-07-14 MED ORDER — PANTOPRAZOLE SODIUM 40 MG PO TBEC
40.0000 mg | DELAYED_RELEASE_TABLET | Freq: Every day | ORAL | Status: DC
  Administered 2019-07-14 – 2019-07-16 (×3): 40 mg via ORAL
  Filled 2019-07-14 (×3): qty 1

## 2019-07-14 MED ORDER — INSULIN ASPART 100 UNIT/ML ~~LOC~~ SOLN
0.0000 [IU] | Freq: Every day | SUBCUTANEOUS | Status: DC
  Administered 2019-07-14: 22:00:00 4 [IU] via SUBCUTANEOUS
  Administered 2019-07-15: 5 [IU] via SUBCUTANEOUS
  Filled 2019-07-14 (×2): qty 1

## 2019-07-14 MED ORDER — IPRATROPIUM-ALBUTEROL 0.5-2.5 (3) MG/3ML IN SOLN
3.0000 mL | Freq: Four times a day (QID) | RESPIRATORY_TRACT | Status: DC
  Administered 2019-07-14 – 2019-07-15 (×5): 3 mL via RESPIRATORY_TRACT
  Filled 2019-07-14 (×5): qty 3

## 2019-07-14 MED ORDER — HYDRALAZINE HCL 50 MG PO TABS
50.0000 mg | ORAL_TABLET | Freq: Two times a day (BID) | ORAL | Status: DC
  Administered 2019-07-14 – 2019-07-16 (×5): 50 mg via ORAL
  Filled 2019-07-14 (×5): qty 1

## 2019-07-14 MED ORDER — TOLNAFTATE 1 % EX CREA: TOPICAL_CREAM | Freq: Two times a day (BID) | CUTANEOUS | Status: DC

## 2019-07-14 MED ORDER — AMIODARONE HCL 200 MG PO TABS
200.0000 mg | ORAL_TABLET | Freq: Every day | ORAL | Status: DC
  Administered 2019-07-14 – 2019-07-16 (×3): 200 mg via ORAL
  Filled 2019-07-14 (×3): qty 1

## 2019-07-14 MED ORDER — ENOXAPARIN SODIUM 40 MG/0.4ML ~~LOC~~ SOLN
40.0000 mg | SUBCUTANEOUS | Status: DC
  Administered 2019-07-14 – 2019-07-15 (×2): 40 mg via SUBCUTANEOUS
  Filled 2019-07-14 (×2): qty 0.4

## 2019-07-14 MED ORDER — METHYLPREDNISOLONE SODIUM SUCC 40 MG IJ SOLR
40.0000 mg | Freq: Two times a day (BID) | INTRAMUSCULAR | Status: DC
  Administered 2019-07-14: 12:00:00 40 mg via INTRAVENOUS
  Filled 2019-07-14: qty 1

## 2019-07-14 MED ORDER — ONDANSETRON HCL 4 MG PO TABS: 4.0000 mg | ORAL_TABLET | Freq: Four times a day (QID) | ORAL | Status: DC | PRN

## 2019-07-14 MED ORDER — ISOSORBIDE MONONITRATE ER 30 MG PO TB24
30.0000 mg | ORAL_TABLET | Freq: Every day | ORAL | Status: DC
  Administered 2019-07-14 – 2019-07-16 (×3): 30 mg via ORAL
  Filled 2019-07-14 (×4): qty 1

## 2019-07-14 MED ORDER — ACETAMINOPHEN 650 MG RE SUPP: 650.0000 mg | Freq: Four times a day (QID) | RECTAL | Status: DC | PRN

## 2019-07-14 MED ORDER — CLOPIDOGREL BISULFATE 75 MG PO TABS
75.0000 mg | ORAL_TABLET | Freq: Every day | ORAL | Status: DC
  Administered 2019-07-14 – 2019-07-16 (×3): 75 mg via ORAL
  Filled 2019-07-14 (×3): qty 1

## 2019-07-14 MED ORDER — ONDANSETRON HCL 4 MG/2ML IJ SOLN: 4.0000 mg | Freq: Four times a day (QID) | INTRAMUSCULAR | Status: DC | PRN

## 2019-07-14 MED ORDER — INSULIN GLARGINE 100 UNIT/ML ~~LOC~~ SOLN
8.0000 [IU] | Freq: Every day | SUBCUTANEOUS | Status: DC
  Administered 2019-07-14: 22:00:00 8 [IU] via SUBCUTANEOUS
  Filled 2019-07-14 (×2): qty 0.08

## 2019-07-14 MED ORDER — ACETAMINOPHEN 325 MG PO TABS: 650.0000 mg | ORAL_TABLET | Freq: Four times a day (QID) | ORAL | Status: DC | PRN

## 2019-07-14 MED ORDER — IPRATROPIUM-ALBUTEROL 0.5-2.5 (3) MG/3ML IN SOLN
3.0000 mL | RESPIRATORY_TRACT | Status: DC | PRN
  Administered 2019-07-14: 3 mL via RESPIRATORY_TRACT
  Filled 2019-07-14: qty 3

## 2019-07-14 MED ORDER — AMLODIPINE BESYLATE 5 MG PO TABS
5.0000 mg | ORAL_TABLET | Freq: Every day | ORAL | Status: DC
  Administered 2019-07-14 – 2019-07-16 (×3): 5 mg via ORAL
  Filled 2019-07-14 (×3): qty 1

## 2019-07-14 MED ORDER — FERROUS SULFATE 325 (65 FE) MG PO TABS
325.0000 mg | ORAL_TABLET | Freq: Two times a day (BID) | ORAL | Status: DC
  Administered 2019-07-14 – 2019-07-16 (×4): 325 mg via ORAL
  Filled 2019-07-14 (×4): qty 1

## 2019-07-14 MED ORDER — INSULIN ASPART 100 UNIT/ML ~~LOC~~ SOLN
0.0000 [IU] | Freq: Three times a day (TID) | SUBCUTANEOUS | Status: DC
  Administered 2019-07-14 – 2019-07-15 (×4): 7 [IU] via SUBCUTANEOUS
  Administered 2019-07-16: 5 [IU] via SUBCUTANEOUS
  Filled 2019-07-14 (×5): qty 1

## 2019-07-14 MED ORDER — AZITHROMYCIN 500 MG PO TABS
250.0000 mg | ORAL_TABLET | Freq: Every day | ORAL | Status: DC
  Administered 2019-07-15 – 2019-07-16 (×2): 250 mg via ORAL
  Filled 2019-07-14 (×2): qty 1

## 2019-07-14 MED ORDER — SODIUM CHLORIDE 0.9% FLUSH
3.0000 mL | Freq: Two times a day (BID) | INTRAVENOUS | Status: DC
  Administered 2019-07-14 – 2019-07-15 (×4): 3 mL via INTRAVENOUS

## 2019-07-14 MED ORDER — ROSUVASTATIN CALCIUM 20 MG PO TABS
40.0000 mg | ORAL_TABLET | Freq: Every day | ORAL | Status: DC
  Administered 2019-07-14 – 2019-07-15 (×2): 40 mg via ORAL
  Filled 2019-07-14 (×2): qty 2

## 2019-07-14 MED ORDER — SODIUM CHLORIDE 0.9% FLUSH
3.0000 mL | INTRAVENOUS | Status: DC | PRN
  Administered 2019-07-15: 10:00:00 3 mL via INTRAVENOUS
  Filled 2019-07-14: qty 3

## 2019-07-14 MED ORDER — SODIUM CHLORIDE 0.9 % IV SOLN
2.0000 g | INTRAVENOUS | Status: DC
  Administered 2019-07-14 – 2019-07-15 (×2): 2 g via INTRAVENOUS
  Filled 2019-07-14: qty 2
  Filled 2019-07-14 (×2): qty 20

## 2019-07-14 MED ORDER — METOPROLOL SUCCINATE ER 50 MG PO TB24
200.0000 mg | ORAL_TABLET | Freq: Every day | ORAL | Status: DC
  Administered 2019-07-14 – 2019-07-16 (×3): 200 mg via ORAL
  Filled 2019-07-14 (×3): qty 4

## 2019-07-14 NOTE — ED Provider Notes (Signed)
[facility] Emergency Department Provider Note   ____________________________________________   First MD Initiated Contact with Patient 07/14/19 [phone]     (approximate)  I have reviewed the triage vital signs and the nursing notes.   HISTORY  Chief Complaint Shortness of Breath    HPI Brett [name] is a 84 y.o. male with past medical history of COPD on 2 L nasal cannula, A. fib, diabetes, hypertension, and CKD who presents to the ED complaining of shortness of breath.  Patient reports he began having a nonproductive cough approximately 1 week ago which is been associated with gradually worsening shortness of breath.  He has been wearing his 2 L nasal cannula, but states when he gets up to walk he quickly becomes short of breath and has to rest.  He also reports feeling very tight in his chest with some chest pain upon breathing.  He has not noticed any pain or swelling in his lower extremities.  He denies any fevers and has not had any sick contacts.  He has been using his nebulizer at home, which initially provided relief, but stopped helping him over the past couple of days.        Past Medical History:  Diagnosis Date  . Anemia   . Aortic regurgitation   . B-cell lymphoma ([place]) 2009   DX AT [facility]  . Bronchitis   . CAD (coronary artery disease)   . Carotid stenosis   . CHF (congestive heart failure) ([place])   . Diabetes mellitus without complication ([place])   . Diabetes mellitus, type 2 ([place])   . Emphysema of lung ([place])   . Essential hypertension   . History of chemotherapy   . Hyperlipidemia   . Hypertension   . IDA (iron deficiency anemia)   . Leg edema   . Meralgia paraesthetica   . Mitral regurgitation   . PUD (peptic ulcer disease)   . PVD (peripheral vascular disease) ([place])   . Tobacco abuse     Patient Active Problem List   Diagnosis Date Noted  . Atrial fibrillation with RVR ([place]) 05/22/2019  . Acute on chronic respiratory failure  with hypoxemia ([place]) 02/09/2019  . Pneumonia 01/21/2019  . Type 2 diabetes mellitus with hyperosmolar nonketotic hyperglycemia ([place]) 08/24/2018  . CHF exacerbation ([place]) 05/29/2018  . COPD exacerbation ([place]) 12/05/2017  . CAP (community acquired pneumonia) 11/13/2017  . Acute respiratory failure ([place]) 11/13/2017  . SIRS (systemic inflammatory response syndrome) ([place]) 11/13/2017  . Stroke ([place]) 12/02/2016  . Stroke (cerebrum) ([place]) 12/02/2016  . Leg weakness, bilateral 09/03/2016  . Chronic diastolic heart failure ([place]) 08/06/2016  . Pleural effusion 08/05/2016  . Sepsis ([place]) 07/10/2016  . HCAP (healthcare-associated pneumonia) 07/10/2016  . HTN (hypertension) 07/10/2016  . Diabetes ([place]) 07/10/2016  . CAD (coronary artery disease) 07/10/2016  . COPD (chronic obstructive pulmonary disease) ([place]) 06/20/2016  . Chest pain 06/18/2016  . B-cell lymphoma ([place]) 10/14/2015    Past Surgical History:  Procedure Laterality Date  . CARDIAC CATHETERIZATION  08/15/2007  . CARDIAC CATHETERIZATION Right 07/13/2016   Procedure: Right/Left Heart Cath and Coronary Angiography;  Surgeon: Dionisio David, MD;  Location: [place] [facility];  Service: Cardiovascular;  Laterality: Right;  . COLONOSCOPY  03/2013  . ESOPHAGOGASTRODUODENOSCOPY  03/2013    Prior to Admission medications   Medication Sig Start Date End Date Taking? Authorizing Provider  albuterol (PROVENTIL HFA;VENTOLIN HFA) 108 (90 Base) MCG/ACT inhaler Inhale 2 puffs into the lungs every 6 (six)  hours as needed for wheezing or shortness of breath. 09/24/17   Loletha Grayer, MD  amiodarone (PACERONE) 200 MG tablet Take 1 tablet (200 mg total) by mouth 2 (two) times daily. 05/25/19   Fritzi Mandes, MD  amLODipine (NORVASC) 5 MG tablet Take 5 mg by mouth daily.  08/27/15   [provider]  cefdinir (OMNICEF) 300 MG capsule Take 1 capsule (300 mg total) by mouth every 12 (twelve) hours. 05/25/19   Fritzi Mandes, MD  clopidogrel (PLAVIX) 75  MG tablet Take 75 mg by mouth daily.  08/27/15   [provider]  ferrous sulfate 325 (65 FE) MG tablet Take 1 tablet (325 mg total) by mouth 2 (two) times daily with a meal. 05/25/19   Fritzi Mandes, MD  gabapentin (NEURONTIN) 100 MG capsule Take 1 capsule by mouth 2 (two) times daily.  09/22/15   [provider]  glimepiride (AMARYL) 2 MG tablet Take 1 tablet (2 mg total) by mouth 2 (two) times daily. 08/25/18 05/22/19  Henreitta Leber, MD  hydrALAZINE (APRESOLINE) 100 MG tablet Take 100 mg by mouth 2 (two) times daily.     [provider]  ipratropium-albuterol (DUONEB) 0.5-2.5 (3) MG/3ML SOLN Take 3 mLs by nebulization every 6 (six) hours as needed. 02/11/19   Henreitta Leber, MD  isosorbide mononitrate (IMDUR) 30 MG 24 hr tablet Take 30 mg by mouth daily.    [provider]  metoprolol (TOPROL-XL) 200 MG 24 hr tablet Take 1 tablet (200 mg total) by mouth daily. 09/24/17   Loletha Grayer, MD  mometasone-formoterol (DULERA) 100-5 MCG/ACT AERO Inhale 2 puffs into the lungs 2 (two) times daily. Patient taking differently: Inhale 2 puffs into the lungs 2 (two) times daily as needed for wheezing or shortness of breath.  05/31/18   Henreitta Leber, MD  pantoprazole (PROTONIX) 40 MG tablet Take 40 mg by mouth daily.    [provider]  rosuvastatin (CRESTOR) 40 MG tablet Take 40 mg by mouth daily. 04/13/19   [provider]  tiotropium (SPIRIVA HANDIHALER) 18 MCG inhalation capsule Place 1 capsule (18 mcg total) into inhaler and inhale daily. 05/31/18   Henreitta Leber, MD  Vitamin D, Ergocalciferol, (DRISDOL) 1.25 MG (50000 UT) CAPS capsule Take 50,000 Units by mouth once a week.    [provider]    Allergies Patient has no known allergies.  Family History  Problem Relation Age of Onset  . CAD Mother   . Rheum arthritis Neg Hx   . Osteoarthritis Neg Hx   . Asthma Neg Hx   . Diabetes Neg Hx   . Cancer Neg Hx     Social History  Social History   Tobacco Use  . Smoking status: Former Smoker    Types: Cigarettes    Quit date: 08/30/2015    Years since quitting: 3.8  . Smokeless tobacco: Never Used  Substance Use Topics  . Alcohol use: No    Frequency: Never  . Drug use: No    Review of Systems  Constitutional: No fever/chills Eyes: No visual changes. ENT: No sore throat. Cardiovascular: Positive for chest tightness. Respiratory: Positive for cough and shortness of breath. Gastrointestinal: No abdominal pain.  No nausea, no vomiting.  No diarrhea.  No constipation. Genitourinary: Negative for dysuria. Musculoskeletal: Negative for back pain. Skin: Negative for rash. Neurological: Negative for headaches, focal weakness or numbness.  ____________________________________________   PHYSICAL EXAM:  VITAL SIGNS: ED Triage Vitals  Enc Vitals Group  BP      Pulse      Resp      Temp      Temp src      SpO2      Weight      Height      Head Circumference      Peak Flow      Pain Score      Pain Loc      Pain Edu?      Excl. in [place]?     Constitutional: Alert and oriented. Eyes: Conjunctivae are normal. Head: Atraumatic. Nose: No congestion/rhinnorhea. Mouth/Throat: Mucous membranes are moist. Neck: Normal ROM Cardiovascular: Tachycardic, regular rhythm. Grossly normal heart sounds. Respiratory: Tachypneic and in moderate respiratory distress.  Inspiratory and expiratory wheezing throughout. Gastrointestinal: Soft and nontender. No distention. Genitourinary: deferred Musculoskeletal: No lower extremity tenderness nor edema. Neurologic:  Normal speech and language. No gross focal neurologic deficits are appreciated. Skin:  Skin is warm, dry and intact. No rash noted. Psychiatric: Mood and affect are normal. Speech and behavior are normal.  ____________________________________________   LABS (all labs ordered are listed, but only abnormal results are displayed)  Labs Reviewed  CBC  WITH DIFFERENTIAL/PLATELET - Abnormal; Notable for the following components:      Result Value   RBC 3.68 (*)    Hemoglobin 10.1 (*)    HCT 32.4 (*)    RDW 15.9 (*)    Monocytes Absolute 1.3 (*)    All other components within normal limits  BASIC METABOLIC PANEL - Abnormal; Notable for the following components:   Glucose, Bld 181 (*)    BUN 29 (*)    Creatinine, Ser 1.88 (*)    Calcium 8.6 (*)    GFR calc non Af Amer 33 (*)    GFR calc Af Amer 38 (*)    All other components within normal limits  BRAIN NATRIURETIC PEPTIDE - Abnormal; Notable for the following components:   B Natriuretic Peptide 190.0 (*)    All other components within normal limits  TROPONIN I (HIGH SENSITIVITY) - Abnormal; Notable for the following components:   Troponin I (High Sensitivity) 24 (*)    All other components within normal limits  SARS CORONAVIRUS 2 (HOSPITAL ORDER, [place] [facility])  TROPONIN I (HIGH SENSITIVITY)   ____________________________________________  EKG  ED ECG REPORT I, Blake Divine, the attending physician, personally viewed and interpreted this ECG.   Date: 07/14/2019  EKG Time: 7:55  Rate: 100  Rhythm: sinus tachycardia  Axis: Normal  Intervals:none  ST&T Change: Borderline prolonged QT, no ST or T changes    PROCEDURES  Procedure(s) performed (including Critical Care):  Procedures   ____________________________________________   INITIAL IMPRESSION / ASSESSMENT AND PLAN / ED COURSE       84 year old male with history of COPD on 2 L nasal cannula chronically presented to the ED with increased cough over the past week and worsening dyspnea on exertion over the past couple of days not alleviated by his nebulizer.  Appears most consistent with COPD exacerbation given his diffuse wheezing, lower suspicion for ACS given he describes his chest pain as tightness that is worse with a deep breath.  Also low suspicion for PE given no apparent risk  factors, more clinically consistent with COPD.  HEPA filter placed in room and will continue treatment with duo nebs, he received steroids prior to arrival with EMS.  Check EKG, chest x-ray, labs.  EKG without ischemic changes, troponin  mildly elevated but comparable to priors and expected for patient's chronic kidney disease.  Chest x-ray with atelectasis versus infiltrate, however lower suspicion for pneumonia given lack of fever or productive cough.  Remainder of labs unremarkable and patient reports improving symptoms following additional breathing treatments.  He is still requiring 4 L nasal cannula, will admit for further management of apparent COPD exacerbation.      ____________________________________________   FINAL CLINICAL IMPRESSION(S) / ED DIAGNOSES  Final diagnoses:  COPD exacerbation ([place])  SOB (shortness of breath)  Essential hypertension     ED Discharge Orders    None       Note:  This document was prepared using Dragon voice recognition software and may include unintentional dictation errors.   Blake Divine, MD 07/14/19 [phone]

## 2019-07-14 NOTE — Progress Notes (Signed)
PT Cancellation Note  Patient Details Name: Brett [name] MRN: [id number] DOB: [birth year]   Cancelled Treatment:    Reason Eval/Treat Not Completed: Patient not medically ready.  PT consult received.  Chart reviewed.  Pt just admitted to hospital this morning with SOB.  Per discussion with pt's nurse, recommending holding PT evaluation until tomorrow d/t SOB concerns (pt not medically ready).  Will re-attempt PT evaluation tomorrow.  Leitha Bleak, PT 07/14/19, 1:42 PM [phone]

## 2019-07-14 NOTE — ED Notes (Signed)
ED TO INPATIENT HANDOFF REPORT  ED Nurse Name and Phone #: Anderson [place] [phone]  S Name/Age/Gender Brett [name] 84 y.o. male Room/Bed: ED25A/ED25A  Code Status   Code Status: Full Code  Home/SNF/Other Home Patient oriented to: self, place, time and situation Is this baseline? Yes   Triage Complete: Triage complete  Chief Complaint SOB  Triage Note Pt arrived via EMS from home with reports of shortness of breath worse over the past few days and cough for the past week.   Pt has hx of COPD, wears oxygen continuously 2L [place]. Per EMS on arrival sats were 92-93% on 2L, bumped up to 4L sats 96%.  Pt uses inhalers at home with no relief.   Pt given 125mg  Solumedrol and Duoneb with EMS.     Allergies No Known Allergies  Level of Care/Admitting Diagnosis ED Disposition    ED Disposition Condition [facility] Area: [place] [[id number]]  Level of Care: Med-Surg [16]  Covid Evaluation: Confirmed COVID Negative  Diagnosis: COPD exacerbation [facility]) [[id number]]  Admitting Physician: Loletha Grayer [[id number]]  Attending Physician: Loletha Grayer [phone]  Estimated length of stay: past midnight tomorrow  Certification:: I certify this patient will need inpatient services for at least 2 midnights  PT Class (Do Not Modify): Inpatient [101]  PT Acc Code (Do Not Modify): Private [1]       B Medical/Surgery History Past Medical History:  Diagnosis Date  . Anemia   . Aortic regurgitation   . B-cell lymphoma ([place]) 2009   DX AT [facility]  . Bronchitis   . CAD (coronary artery disease)   . Carotid stenosis   . CHF (congestive heart failure) ([place])   . Diabetes mellitus without complication ([place])   . Diabetes mellitus, type 2 ([place])   . Emphysema of lung ([place])   . Essential hypertension   . History of chemotherapy   . Hyperlipidemia   . Hypertension   . IDA (iron deficiency anemia)   . Leg edema   . Meralgia paraesthetica   . Mitral  regurgitation   . PUD (peptic ulcer disease)   . PVD (peripheral vascular disease) ([place])   . Tobacco abuse    Past Surgical History:  Procedure Laterality Date  . CARDIAC CATHETERIZATION  08/15/2007  . CARDIAC CATHETERIZATION Right 07/13/2016   Procedure: Right/Left Heart Cath and Coronary Angiography;  Surgeon: Dionisio David, MD;  Location: [place] [facility];  Service: Cardiovascular;  Laterality: Right;  . COLONOSCOPY  03/2013  . ESOPHAGOGASTRODUODENOSCOPY  03/2013     A IV Location/Drains/Wounds Patient Lines/Drains/Airways Status   Active Line/Drains/Airways    Name:   Placement date:   Placement time:   Site:   Days:   Peripheral IV 07/14/19 Left Antecubital   07/14/19    -    Antecubital   less than 1          Intake/Output Last 24 hours No intake or output data in the 24 hours ending 07/14/19 1030  Labs/Imaging Results for orders placed or performed during the hospital encounter of 07/14/19 (from the past 48 hour(s))  CBC with Differential     Status: Abnormal   Collection Time: 07/14/19  7:56 AM  Result Value Ref Range   WBC 8.9 4.0 - 10.5 K/uL   RBC 3.68 (L) 4.22 - 5.81 MIL/uL   Hemoglobin 10.1 (L) 13.0 - 17.0 g/dL   HCT 32.4 (L) 39.0 - 52.0 %   MCV 88.0 80.0 -  100.0 fL   MCH 27.4 26.0 - 34.0 pg   MCHC 31.2 30.0 - 36.0 g/dL   RDW 15.9 (H) 11.5 - 15.5 %   Platelets 160 150 - 400 K/uL   nRBC 0.0 0.0 - 0.2 %   Neutrophils Relative % 70 %   Neutro Abs 6.2 1.7 - 7.7 K/uL   Lymphocytes Relative 12 %   Lymphs Abs 1.1 0.7 - 4.0 K/uL   Monocytes Relative 14 %   Monocytes Absolute 1.3 (H) 0.1 - 1.0 K/uL   Eosinophils Relative 3 %   Eosinophils Absolute 0.3 0.0 - 0.5 K/uL   Basophils Relative 0 %   Basophils Absolute 0.0 0.0 - 0.1 K/uL   Immature Granulocytes 1 %   Abs Immature Granulocytes 0.05 0.00 - 0.07 K/uL    Comment: Performed at [facility], [address] [place], [place] [address]  Basic metabolic panel     Status: Abnormal   Collection  Time: 07/14/19  7:56 AM  Result Value Ref Range   Sodium 142 135 - 145 mmol/L   Potassium 3.9 3.5 - 5.1 mmol/L   Chloride 105 98 - 111 mmol/L   CO2 28 22 - 32 mmol/L   Glucose, Bld 181 (H) 70 - 99 mg/dL   BUN 29 (H) 8 - 23 mg/dL   Creatinine, Ser 1.88 (H) 0.61 - 1.24 mg/dL   Calcium 8.6 (L) 8.9 - 10.3 mg/dL   GFR calc non Af Amer 33 (L) >60 mL/min   GFR calc Af Amer 38 (L) >60 mL/min   Anion gap 9 5 - 15    Comment: Performed at [facility], [place], [place] [address]  Troponin I (High Sensitivity)     Status: Abnormal   Collection Time: 07/14/19  7:56 AM  Result Value Ref Range   Troponin I (High Sensitivity) 24 (H) <18 ng/L    Comment: (NOTE) Elevated high sensitivity troponin I (hsTnI) values and significant  changes across serial measurements may suggest ACS but many other  chronic and acute conditions are known to elevate hsTnI results.  Refer to the "Links" section for chest pain algorithms and additional  guidance. Performed at [facility], [address] [place], [place] [address]   Brain natriuretic peptide     Status: Abnormal   Collection Time: 07/14/19  7:56 AM  Result Value Ref Range   B Natriuretic Peptide 190.0 (H) 0.0 - 100.0 pg/mL    Comment: Performed at [facility], [address] [place], [place] [address]  SARS Coronavirus 2 [facility] order, Performed in [facility] hospital lab) Nasopharyngeal Nasopharyngeal Swab     Status: None   Collection Time: 07/14/19  8:11 AM   Specimen: Nasopharyngeal Swab  Result Value Ref Range   SARS Coronavirus 2 NEGATIVE NEGATIVE    Comment: (NOTE) If result is NEGATIVE SARS-CoV-2 target nucleic acids are NOT DETECTED. The SARS-CoV-2 RNA is generally detectable in upper and lower  respiratory specimens during the acute phase of infection. The lowest  concentration of SARS-CoV-2 viral copies this assay can detect is 250  copies / mL. A negative result does not preclude  SARS-CoV-2 infection  and should not be used as the sole basis for treatment or other  patient management decisions.  A negative result may occur with  improper specimen collection / handling, submission of specimen other  than nasopharyngeal swab, presence of viral mutation(s) within the  areas targeted by this assay, and inadequate number  of viral copies  (<250 copies / mL). A negative result must be combined with clinical  observations, patient history, and epidemiological information. If result is POSITIVE SARS-CoV-2 target nucleic acids are DETECTED. The SARS-CoV-2 RNA is generally detectable in upper and lower  respiratory specimens dur ing the acute phase of infection.  Positive  results are indicative of active infection with SARS-CoV-2.  Clinical  correlation with patient history and other diagnostic information is  necessary to determine patient infection status.  Positive results do  not rule out bacterial infection or co-infection with other viruses. If result is PRESUMPTIVE POSTIVE SARS-CoV-2 nucleic acids MAY BE PRESENT.   A presumptive positive result was obtained on the submitted specimen  and confirmed on repeat testing.  While 2019 novel coronavirus  (SARS-CoV-2) nucleic acids may be present in the submitted sample  additional confirmatory testing may be necessary for epidemiological  and / or clinical management purposes  to differentiate between  SARS-CoV-2 and other Sarbecovirus currently known to infect humans.  If clinically indicated additional testing with an alternate test  methodology [phone]) is advised. The SARS-CoV-2 RNA is generally  detectable in upper and lower respiratory sp ecimens during the acute  phase of infection. The expected result is Negative. Fact Sheet for Patients:  StrictlyIdeas.no Fact Sheet for Healthcare Providers: BankingDealers.co.za This test is not yet approved or cleared by the  [place] FDA and has been authorized for detection and/or diagnosis of SARS-CoV-2 by FDA under an Emergency Use Authorization (EUA).  This EUA will remain in effect (meaning this test can be used) for the duration of the COVID-19 declaration under Section 564(b)(1) of the Act, 21 U.S.C. section 360bbb-3(b)(1), unless the authorization is terminated or revoked sooner. Performed at [facility], [address] [place], [place] [address]    Dg Chest Portable 1 View  Result Date: 07/14/2019 CLINICAL DATA:  Worsening shortness of breath and cough for several days. Emphysema. Congestive heart failure EXAM: PORTABLE CHEST 1 VIEW COMPARISON:  05/22/2019 FINDINGS: Stable cardiomegaly. Aortic atherosclerosis. Increased opacity is seen in the right lung base which may be due to infiltrate or atelectasis. Small right pleural effusion cannot be excluded on this portable exam. Left lung appears clear. IMPRESSION: 1. Increased right basilar atelectasis versus infiltrate. Small right pleural effusion cannot be excluded. 2. Stable cardiomegaly. Electronically Signed   By: Marlaine Hind M.D.   On: 07/14/2019 08:29    Pending Labs Unresulted Labs (From admission, onward)    Start     Ordered   07/21/19 0500  Creatinine, serum  (enoxaparin (LOVENOX)    CrCl >/= 30 ml/min)  Weekly,   STAT    Comments: while on enoxaparin therapy    07/14/19 1007   07/15/19 9528  Basic metabolic panel  Tomorrow morning,   STAT     07/14/19 1007   07/15/19 0500  CBC  Tomorrow morning,   STAT     07/14/19 1007   07/15/19 0500  Procalcitonin  Daily,   STAT     07/14/19 1008   07/14/19 1009  Expectorated sputum assessment w rflx to resp cult  Once,   STAT    Question:  Patient immune status  Answer:  Normal   07/14/19 1008   07/14/19 1008  Procalcitonin  Once,   R     07/14/19 1008   07/14/19 1006  HIV antibody  Once,   STAT     07/14/19 1006  Vitals/Pain Today's Vitals   07/14/19 0830  07/14/19 0900 07/14/19 0930 07/14/19 1000  BP: 139/68 (!) 151/71 (!) 153/72 (!) 169/76  Pulse: 94 98 95 (!) 105  Resp: (!) 21 (!) 24 20 (!) 32  Temp:      TempSrc:      SpO2: 99% 97% 100% 99%  Weight:      Height:      PainSc:        Isolation Precautions No active isolations  Medications Medications  methylPREDNISolone sodium succinate (SOLU-MEDROL) 40 mg/mL injection 40 mg (has no administration in time range)  ipratropium-albuterol (DUONEB) 0.5-2.5 (3) MG/3ML nebulizer solution 3 mL (has no administration in time range)  budesonide (PULMICORT) nebulizer solution 0.5 mg (has no administration in time range)  cefTRIAXone (ROCEPHIN) 2 g in sodium chloride 0.9 % 100 mL IVPB (2 g Intravenous New Bag/Given 07/14/19 1024)  azithromycin (ZITHROMAX) tablet 500 mg (500 mg Oral Given 07/14/19 1025)    Followed by  azithromycin (ZITHROMAX) tablet 250 mg (has no administration in time range)  enoxaparin (LOVENOX) injection 40 mg (has no administration in time range)  acetaminophen (TYLENOL) tablet 650 mg (has no administration in time range)    Or  acetaminophen (TYLENOL) suppository 650 mg (has no administration in time range)  ondansetron (ZOFRAN) tablet 4 mg (has no administration in time range)    Or  ondansetron (ZOFRAN) injection 4 mg (has no administration in time range)  insulin aspart (novoLOG) injection 0-9 Units (has no administration in time range)  insulin aspart (novoLOG) injection 0-5 Units (has no administration in time range)  amiodarone (PACERONE) tablet 200 mg (has no administration in time range)  amLODipine (NORVASC) tablet 5 mg (has no administration in time range)  isosorbide mononitrate (IMDUR) 24 hr tablet 30 mg (has no administration in time range)  metoprolol succinate (TOPROL-XL) 24 hr tablet 200 mg (has no administration in time range)  rosuvastatin (CRESTOR) tablet 40 mg (has no administration in time range)  hydrALAZINE (APRESOLINE) tablet 50 mg (has no  administration in time range)  pantoprazole (PROTONIX) EC tablet 40 mg (has no administration in time range)  clopidogrel (PLAVIX) tablet 75 mg (has no administration in time range)  ferrous sulfate tablet 325 mg (has no administration in time range)  gabapentin (NEURONTIN) capsule 100 mg (has no administration in time range)    Mobility walks Low fall risk   Focused Assessments Pulmonary Assessment Handoff:  Lung sounds: Bilateral Breath Sounds: Expiratory wheezes L Breath Sounds: Expiratory wheezes R Breath Sounds: Expiratory wheezes O2 Device: Nasal Cannula O2 Flow Rate (L/min): 4 L/min      R Recommendations: See Admitting Provider Note  Report given to:   Additional Notes:

## 2019-07-14 NOTE — TOC Initial Note (Signed)
Transition of Care [facility]) - Initial/Assessment Note    Patient Details  Name: Brett [name] MRN: [id number] Date of Birth: [birth year]  Transition of Care [facility]) CM/SW Contact:    Shelbie Hutching, RN Phone Number: 07/14/2019, 1:31 PM  Clinical Narrative:                 Patient admitted with shortness of breath, patient has a history of COPD and is on home O2 at 2L chronic.  Patient is very hard of hearing.  Patient reports that he lives alone, he has a good friend that provides transportation for him and a next door neighbor that checks on him all the time.  Patient is open with [facility] for nursing, Floydene Flock aware of admission.  RNCM will follow and assess for additional discharge needs.   Expected Discharge Plan: [place] Barriers to Discharge: Continued Medical Work up   Patient Goals and CMS Choice Patient states their goals for this hospitalization and ongoing recovery are:: Breath better and go home as soon as possible CMS Medicare.gov Compare Post Acute Care list provided to:: Patient Choice offered to / list presented to : Patient  Expected Discharge Plan and Services Expected Discharge Plan: [place]     Post Acute Care Choice: Resumption of Svcs/PTA Provider Living arrangements for the past 2 months: Single Family Home                                      Prior Living Arrangements/Services Living arrangements for the past 2 months: Single Family Home Lives with:: Self Patient language and need for interpreter reviewed:: No Do you feel safe going back to the place where you live?: Yes      Need for Family Participation in Patient Care: Yes (Comment) Care giver support system in place?: Yes (comment)(friends close by) Current home services: Home RN, DME(oxygen, nebulizer) Criminal Activity/Legal Involvement Pertinent to Current Situation/Hospitalization: No - Comment as needed  Activities of Daily  Living Home Assistive Devices/Equipment: Cane (specify quad or straight) ADL Screening (condition at time of admission) Patient's cognitive ability adequate to safely complete daily activities?: Yes Is the patient deaf or have difficulty hearing?: Yes Does the patient have difficulty seeing, even when wearing glasses/contacts?: No Does the patient have difficulty concentrating, remembering, or making decisions?: No Patient able to express need for assistance with ADLs?: Yes Does the patient have difficulty dressing or bathing?: No Independently performs ADLs?: Yes (appropriate for developmental age) Does the patient have difficulty walking or climbing stairs?: Yes Weakness of Legs: None Weakness of Arms/Hands: None  Permission Sought/Granted Permission sought to share information with : Case Manager, Other (comment) Permission granted to share information with : Yes, Verbal Permission Granted     Permission granted to share info w AGENCY: [facility]        Emotional Assessment Appearance:: Appears stated age Attitude/Demeanor/Rapport: Engaged Affect (typically observed): Accepting Orientation: : Oriented to Self, Oriented to Place, Oriented to  Time, Oriented to Situation Alcohol / Substance Use: Not Applicable Psych Involvement: No (comment)  Admission diagnosis:  SOB (shortness of breath) [R06.02] COPD exacerbation (HCC) [J44.1] Essential hypertension [I10] Patient Active Problem List   Diagnosis Date Noted  . Atrial fibrillation with RVR ([place]) 05/22/2019  . Acute on chronic respiratory failure with hypoxemia ([place]) 02/09/2019  . Pneumonia 01/21/2019  . Type 2 diabetes mellitus  with hyperosmolar nonketotic hyperglycemia ([place]) 08/24/2018  . CHF exacerbation ([place]) 05/29/2018  . COPD exacerbation ([place]) 12/05/2017  . CAP (community acquired pneumonia) 11/13/2017  . Acute respiratory failure ([place]) 11/13/2017  . SIRS (systemic inflammatory response syndrome) ([place]) 11/13/2017  .  Stroke ([place]) 12/02/2016  . Stroke (cerebrum) ([place]) 12/02/2016  . Leg weakness, bilateral 09/03/2016  . Chronic diastolic heart failure ([place]) 08/06/2016  . Pleural effusion 08/05/2016  . Sepsis ([place]) 07/10/2016  . HCAP (healthcare-associated pneumonia) 07/10/2016  . HTN (hypertension) 07/10/2016  . Diabetes ([place]) 07/10/2016  . CAD (coronary artery disease) 07/10/2016  . COPD (chronic obstructive pulmonary disease) ([place]) 06/20/2016  . Chest pain 06/18/2016  . B-cell lymphoma ([place]) 10/14/2015   PCP:  Perrin Maltese, MD Pharmacy:   [id number] [address], [place] [address] Phone: [phone] Fax: [phone]     Social Determinants of Health (SDOH) Interventions    Readmission Risk Interventions No flowsheet data found.

## 2019-07-14 NOTE — ED Triage Notes (Signed)
Pt arrived via EMS from home with reports of shortness of breath worse over the past few days and cough for the past week.   Pt has hx of COPD, wears oxygen continuously 2L [place]. Per EMS on arrival sats were 92-93% on 2L, bumped up to 4L sats 96%.  Pt uses inhalers at home with no relief.   Pt given 125mg  Solumedrol and Duoneb with EMS.

## 2019-07-14 NOTE — H&P (Signed)
[place] [facility] [place] NAME: [name]    MR#:  [id number]  DATE OF BIRTH:  [birth year]  DATE OF ADMISSION:  07/14/2019  PRIMARY CARE PHYSICIAN: Perrin Maltese, MD   REQUESTING/REFERRING PHYSICIAN: Dr Blake Divine  CHIEF COMPLAINT:   Chief Complaint  Patient presents with  . Shortness of Breath    HISTORY OF PRESENT ILLNESS:  Brett [name]  is a 84 y.o. male with a known history of COPD on home oxygen.  He presents with shortness of breath and trouble breathing over the last few days.  He states that he has been using his inhalers and nebulizers without any response.  He states that he has left-sided chest pain when he takes a deep breath.  Some wheezing.  He states that he does not have any chest pain currently.  Hospitalist services were contacted for further evaluation.  PAST MEDICAL HISTORY:   Past Medical History:  Diagnosis Date  . Anemia   . Aortic regurgitation   . B-cell lymphoma ([place]) 2009   DX AT [facility]  . Bronchitis   . CAD (coronary artery disease)   . Carotid stenosis   . CHF (congestive heart failure) ([place])   . Diabetes mellitus without complication ([place])   . Diabetes mellitus, type 2 ([place])   . Emphysema of lung ([place])   . Essential hypertension   . History of chemotherapy   . Hyperlipidemia   . Hypertension   . IDA (iron deficiency anemia)   . Leg edema   . Meralgia paraesthetica   . Mitral regurgitation   . PUD (peptic ulcer disease)   . PVD (peripheral vascular disease) ([place])   . Tobacco abuse     PAST SURGICAL HISTORY:   Past Surgical History:  Procedure Laterality Date  . CARDIAC CATHETERIZATION  08/15/2007  . CARDIAC CATHETERIZATION Right 07/13/2016   Procedure: Right/Left Heart Cath and Coronary Angiography;  Surgeon: Dionisio David, MD;  Location: [facility];  Service: Cardiovascular;  Laterality: Right;  . COLONOSCOPY  03/2013  . ESOPHAGOGASTRODUODENOSCOPY  03/2013     SOCIAL HISTORY:   Social History   Tobacco Use  . Smoking status: Former Smoker    Types: Cigarettes    Quit date: 08/30/2015    Years since quitting: 3.8  . Smokeless tobacco: Never Used  Substance Use Topics  . Alcohol use: No    Frequency: Never    FAMILY HISTORY:   Family History  Problem Relation Age of Onset  . CAD Mother   . Rheum arthritis Neg Hx   . Osteoarthritis Neg Hx   . Asthma Neg Hx   . Diabetes Neg Hx   . Cancer Neg Hx     DRUG ALLERGIES:  No Known Allergies  REVIEW OF SYSTEMS:  Difficult to obtain secondary to patient being hard of hearing  CONSTITUTIONAL: No fever.  EYES: No blurred or double vision.  EARS, NOSE, AND THROAT: No sore throat RESPIRATORY: Positive for cough, shortness of breath, and wheezing.  CARDIOVASCULAR: Positive for chest pain.  GASTROINTESTINAL: No abdominal pain. GENITOURINARY: No dysuria.  ENDOCRINE: No polyuria HEMATOLOGY: No anemia SKIN: Positive for scaling on his feet MUSCULOSKELETAL: No joint pain.   NEUROLOGIC: History of neuropathy.  PSYCHIATRY: No depression.   MEDICATIONS AT HOME:   Prior to Admission medications   Medication Sig Start Date End Date Taking? Authorizing Provider  albuterol (PROVENTIL HFA;VENTOLIN HFA) 108 (90 Base) MCG/ACT inhaler Inhale 2 puffs into the  lungs every 6 (six) hours as needed for wheezing or shortness of breath. 09/24/17   Loletha Grayer, MD  amiodarone (PACERONE) 200 MG tablet Take 1 tablet (200 mg total) by mouth 2 (two) times daily. 05/25/19   Fritzi Mandes, MD  amLODipine (NORVASC) 5 MG tablet Take 5 mg by mouth daily.  08/27/15   [provider]  clopidogrel (PLAVIX) 75 MG tablet Take 75 mg by mouth daily.  08/27/15   [provider]  ferrous sulfate 325 (65 FE) MG tablet Take 1 tablet (325 mg total) by mouth 2 (two) times daily with a meal. 05/25/19   Fritzi Mandes, MD  gabapentin (NEURONTIN) 100 MG capsule Take 1 capsule by mouth 2 (two) times daily.   09/22/15   [provider]  glimepiride (AMARYL) 2 MG tablet Take 1 tablet (2 mg total) by mouth 2 (two) times daily. 08/25/18 05/22/19  Henreitta Leber, MD  hydrALAZINE (APRESOLINE) 100 MG tablet Take 100 mg by mouth 2 (two) times daily.     [provider]  ipratropium-albuterol (DUONEB) 0.5-2.5 (3) MG/3ML SOLN Take 3 mLs by nebulization every 6 (six) hours as needed. 02/11/19   Henreitta Leber, MD  isosorbide mononitrate (IMDUR) 30 MG 24 hr tablet Take 30 mg by mouth daily.    [provider]  metoprolol (TOPROL-XL) 200 MG 24 hr tablet Take 1 tablet (200 mg total) by mouth daily. 09/24/17   Loletha Grayer, MD  mometasone-formoterol (DULERA) 100-5 MCG/ACT AERO Inhale 2 puffs into the lungs 2 (two) times daily. Patient taking differently: Inhale 2 puffs into the lungs 2 (two) times daily as needed for wheezing or shortness of breath.  05/31/18   Henreitta Leber, MD  pantoprazole (PROTONIX) 40 MG tablet Take 40 mg by mouth daily.    [provider]  rosuvastatin (CRESTOR) 40 MG tablet Take 40 mg by mouth daily. 04/13/19   [provider]  tiotropium (SPIRIVA HANDIHALER) 18 MCG inhalation capsule Place 1 capsule (18 mcg total) into inhaler and inhale daily. 05/31/18   Henreitta Leber, MD  Vitamin D, Ergocalciferol, (DRISDOL) 1.25 MG (50000 UT) CAPS capsule Take 50,000 Units by mouth once a week.    [provider]      VITAL SIGNS:  Blood pressure (!) 169/76, pulse (!) 105, temperature 97.9 F (36.6 C), temperature source Oral, resp. rate (!) 32, height 5\' 10"  (1.778 m), weight 90.7 kg, SpO2 99 %.  PHYSICAL EXAMINATION:  GENERAL:  84 y.o.-year-old patient lying in the bed with no acute distress.  EYES: Pupils equal, round, reactive to light and accommodation. No scleral icterus. Extraocular muscles intact.  HEENT: Head atraumatic, normocephalic. Oropharynx and nasopharynx clear.  NECK:  Supple, no jugular venous distention. No thyroid  enlargement, no tenderness.  LUNGS: Decreased breath sounds bilaterally, positive expiratory wheezing, positive rhonchi left base. No use of accessory muscles of respiration.  CARDIOVASCULAR: S1, S2 tachycardic. No murmurs, rubs, or gallops.  ABDOMEN: Soft, nontender, nondistended. Bowel sounds present. No organomegaly or mass.  EXTREMITIES: No pedal edema, cyanosis, or clubbing.  NEUROLOGIC: Cranial nerves II through XII are intact. Muscle strength 5/5 in all extremities. Sensation intact. Gait not checked.  PSYCHIATRIC: The patient is alert and oriented x 3.  SKIN: No rash, lesion, or ulcer.   LABORATORY PANEL:   CBC Recent Labs  Lab 07/14/19 0756  WBC 8.9  HGB 10.1*  HCT 32.4*  PLT 160   ------------------------------------------------------------------------------------------------------------------  Chemistries  Recent Labs  Lab 07/14/19 0756  NA 142  K 3.9  CL 105  CO2 28  GLUCOSE 181*  BUN 29*  CREATININE 1.88*  CALCIUM 8.6*   ------------------------------------------------------------------------------------------------------------------    RADIOLOGY:  Dg Chest Portable 1 View  Result Date: 07/14/2019 CLINICAL DATA:  Worsening shortness of breath and cough for several days. Emphysema. Congestive heart failure EXAM: PORTABLE CHEST 1 VIEW COMPARISON:  05/22/2019 FINDINGS: Stable cardiomegaly. Aortic atherosclerosis. Increased opacity is seen in the right lung base which may be due to infiltrate or atelectasis. Small right pleural effusion cannot be excluded on this portable exam. Left lung appears clear. IMPRESSION: 1. Increased right basilar atelectasis versus infiltrate. Small right pleural effusion cannot be excluded. 2. Stable cardiomegaly. Electronically Signed   By: Marlaine Hind M.D.   On: 07/14/2019 08:29    EKG:   Sinus tachycardia 100 bpm, RSR prime, QTC 487  IMPRESSION AND PLAN:   1.  COPD exacerbation with possible pneumonia.  Start Solu-Medrol,  DuoNeb nebulizer solution and budesonide nebulizers.  Empiric antibiotics secondary to possible pneumonia.  Check procalcitonin.  Patient has chronic respiratory failure and is on oxygen at home 2.  Chest pain likely secondary to COPD exacerbation and pleuritic pain. 3.  Paroxysmal atrial fibrillation on amiodarone and high-dose Toprol.  Patient does see Dr. Ceasar Mons as outpatient. 4.  Type 2 diabetes mellitus we will put on sliding scale secondary to starting steroids. 5.  Chronic kidney disease stage III.  Continue to monitor closely 6.  Hyperlipidemia unspecified on Crestor 7.  Peripheral vascular disease on Plavix 8.  Looking back at old CT scan of the chest he does have pulmonary nodule.  This will have to be followed over time 9.  Chronic diastolic congestive heart failure.  I do not think this is heart failure.  Continue usual cardiac meds  All the records are reviewed and case discussed with ED provider. Management plans discussed with the patient, and friend [name] on the phone and they are in agreement.  CODE STATUS: Full code  TOTAL TIME TAKING CARE OF THIS PATIENT: 50 minutes.    Loletha Grayer M.D on 07/14/2019 at 10:12 AM  Between 7am to 6pm - Pager - [phone]  After 6pm call admission pager [phone]  [facility] Office  [phone]  CC: Primary care physician; Perrin Maltese, MD

## 2019-07-14 NOTE — Progress Notes (Signed)
Lives alone with friend and neighbor who check on him. Unable to collect some information/education due pt very HOH. Very DOE to BR with 02 tubing extension added/respiratory aware. Pt unable to tolerate walking in hall due to SOB/dysnea- will reattempt tomorrow.

## 2019-07-15 DIAGNOSIS — J441 Chronic obstructive pulmonary disease with (acute) exacerbation: Secondary | ICD-10-CM | POA: Diagnosis not present

## 2019-07-15 DIAGNOSIS — R0602 Shortness of breath: Secondary | ICD-10-CM | POA: Diagnosis not present

## 2019-07-15 LAB — BASIC METABOLIC PANEL
Anion gap: 7 (ref 5–15)
BUN: 47 mg/dL — ABNORMAL HIGH (ref 8–23)
CO2: 27 mmol/L (ref 22–32)
Calcium: 8.4 mg/dL — ABNORMAL LOW (ref 8.9–10.3)
Chloride: 103 mmol/L (ref 98–111)
Creatinine, Ser: 2.05 mg/dL — ABNORMAL HIGH (ref 0.61–1.24)
GFR calc Af Amer: 34 mL/min — ABNORMAL LOW (ref >60)
GFR calc non Af Amer: 29 mL/min — ABNORMAL LOW (ref >60)
Glucose, Bld: 331 mg/dL — ABNORMAL HIGH (ref 70–99)
Potassium: 4.5 mmol/L (ref 3.5–5.1)
Sodium: 137 mmol/L (ref 135–145)

## 2019-07-15 LAB — GLUCOSE, CAPILLARY
Glucose-Capillary: 320 mg/dL — ABNORMAL HIGH (ref 70–99)
Glucose-Capillary: 402 mg/dL — ABNORMAL HIGH (ref 70–99)
Glucose-Capillary: 333 mg/dL — ABNORMAL HIGH (ref 70–99)
Glucose-Capillary: 371 mg/dL — ABNORMAL HIGH (ref 70–99)

## 2019-07-15 LAB — CBC
HCT: 29.2 % — ABNORMAL LOW (ref 39.0–52.0)
Hemoglobin: 9.2 g/dL — ABNORMAL LOW (ref 13.0–17.0)
MCH: 27.1 pg (ref 26.0–34.0)
MCHC: 31.5 g/dL (ref 30.0–36.0)
MCV: 85.9 fL (ref 80.0–100.0)
Platelets: 164 10*3/uL (ref 150–400)
RBC: 3.4 MIL/uL — ABNORMAL LOW (ref 4.22–5.81)
RDW: 15.6 % — ABNORMAL HIGH (ref 11.5–15.5)
WBC: 11.1 10*3/uL — ABNORMAL HIGH (ref 4.0–10.5)
nRBC: 0 % (ref 0.0–0.2)

## 2019-07-15 LAB — PROCALCITONIN: Procalcitonin: <0.1 ng/mL

## 2019-07-15 MED ORDER — IPRATROPIUM-ALBUTEROL 0.5-2.5 (3) MG/3ML IN SOLN
3.0000 mL | Freq: Three times a day (TID) | RESPIRATORY_TRACT | Status: DC
  Administered 2019-07-15 – 2019-07-16 (×2): 3 mL via RESPIRATORY_TRACT
  Filled 2019-07-15 (×2): qty 3

## 2019-07-15 MED ORDER — INSULIN GLARGINE 100 UNIT/ML ~~LOC~~ SOLN
14.0000 [IU] | Freq: Every day | SUBCUTANEOUS | Status: DC
  Administered 2019-07-15: 21:00:00 14 [IU] via SUBCUTANEOUS
  Filled 2019-07-15 (×2): qty 0.14

## 2019-07-15 MED ORDER — ENOXAPARIN SODIUM 40 MG/0.4ML ~~LOC~~ SOLN
40.0000 mg | SUBCUTANEOUS | Status: DC
  Administered 2019-07-16: 40 mg via SUBCUTANEOUS
  Filled 2019-07-15: qty 0.4

## 2019-07-15 MED ORDER — INSULIN ASPART 100 UNIT/ML ~~LOC~~ SOLN
15.0000 [IU] | Freq: Once | SUBCUTANEOUS | Status: AC
  Administered 2019-07-15: 15 [IU] via SUBCUTANEOUS
  Filled 2019-07-15: qty 1

## 2019-07-15 NOTE — Progress Notes (Signed)
Nutrition Brief Note  RD received consult for assessment of nutrition requirements/status per COPD gold protocol  Wt Readings from Last 15 Encounters:  07/14/19 90.7 kg  05/24/19 90.5 kg  02/09/19 81.7 kg  01/23/19 84.3 kg  08/25/18 88.5 kg  05/31/18 90.6 kg  12/30/17 87.1 kg  12/05/17 84.1 kg  11/16/17 87.3 kg  09/21/17 82.7 kg  12/08/16 78.5 kg  12/02/16 83.1 kg  10/07/16 79.4 kg  09/03/16 83 kg  08/12/16 83.5 kg   Met with patient at bedside. He is very hard of hearing so it is difficult to obtain history. He only reports his appetite is good. Unable to provide any further details. Per RN patient eating well. According to documentation in chart he ate 100% of breakfast this morning. Weight appears stable in chart. No muscle or fat wasting noted on nutrition-focused physical exam. Patient does not meet criteria for malnutrition at this time.  Body mass index is 28.7 kg/m. Patient meets criteria for overweight based on current BMI.   Current diet order is heart healthy, patient is consuming approximately 100% of meals at this time. Labs and medications reviewed.   No nutrition interventions warranted at this time. If nutrition issues arise, please consult RD.   Willey Blade, MS, [place], LDN Office: [phone] Pager: [phone] After Hours/Weekend Pager: [phone]

## 2019-07-15 NOTE — Progress Notes (Addendum)
Patient ID: Brett [name], male   DOB: [birth year], 84 y.o.   MRN: [id number]  [employer] PROGRESS NOTE  KALYN DIMATTIA CXF:[id number] DOB: 1935/06/16 DOA: 07/14/2019 PCP: Perrin Maltese, MD  HPI/Subjective: Patient feeling better but still having some cough shortness of breath and some wheezing.  Objective: Vitals:   07/15/19 1335 07/15/19 1337  BP:    Pulse: 77 91  Resp:    Temp:    SpO2: 94% (!) 87%    Filed Weights   07/14/19 0752  Weight: 90.7 kg    ROS: Review of Systems  Constitutional: Negative for chills and fever.  Eyes: Negative for blurred vision.  Respiratory: Positive for cough, shortness of breath and wheezing.   Cardiovascular: Negative for chest pain.  Gastrointestinal: Negative for constipation, diarrhea, nausea and vomiting.  Genitourinary: Negative for dysuria.  Musculoskeletal: Negative for joint pain.  Neurological: Negative for dizziness and headaches.   Exam: Physical Exam  Constitutional: He is oriented to person, place, and time.  HENT:  Nose: No mucosal edema.  Mouth/Throat: No oropharyngeal exudate or posterior oropharyngeal edema.  Eyes: Pupils are equal, round, and reactive to light. Conjunctivae, EOM and lids are normal.  Neck: No JVD present. Carotid bruit is not present. No edema present. No thyroid mass and no thyromegaly present.  Cardiovascular: S1 normal and S2 normal. Exam reveals no gallop.  No murmur heard. Pulses:      Dorsalis pedis pulses are 2+ on the right side and 2+ on the left side.  Respiratory: No respiratory distress. He has decreased breath sounds in the right lower field and the left lower field. He has no wheezes. He has rhonchi in the right lower field and the left lower field. He has no rales.  GI: Soft. Bowel sounds are normal. There is no abdominal tenderness.  Musculoskeletal:     Right ankle: He exhibits swelling.     Left ankle: He exhibits swelling.  Lymphadenopathy:    He has no cervical  adenopathy.  Neurological: He is alert and oriented to person, place, and time. No cranial nerve deficit.  Skin: Skin is warm. Nails show no clubbing.  Scaling on the feet is much improved from yesterday.  Patient still has severely overgrown and fungal toenails which we will have to get to the podiatrist. Right chest does have a small skin lesion that he will have to follow-up with a dermatologist for.  Psychiatric: He has a normal mood and affect.      Data Reviewed: Basic Metabolic Panel: Recent Labs  Lab 07/14/19 0756 07/15/19 0536  NA 142 137  K 3.9 4.5  CL 105 103  CO2 28 27  GLUCOSE 181* 331*  BUN 29* 47*  CREATININE 1.88* 2.05*  CALCIUM 8.6* 8.4*   Liver Function Tests: No results for input(s): AST, ALT, ALKPHOS, BILITOT, PROT, ALBUMIN in the last 168 hours. No results for input(s): LIPASE, AMYLASE in the last 168 hours. No results for input(s): AMMONIA in the last 168 hours. CBC: Recent Labs  Lab 07/14/19 0756 07/15/19 0536  WBC 8.9 11.1*  NEUTROABS 6.2  --   HGB 10.1* 9.2*  HCT 32.4* 29.2*  MCV 88.0 85.9  PLT 160 164   Cardiac Enzymes: No results for input(s): CKTOTAL, CKMB, CKMBINDEX, TROPONINI in the last 168 hours. BNP (last 3 results) Recent Labs    02/09/19 0425 05/22/19 1118 07/14/19 0756  BNP 126.0* 206.0* 190.0*    ProBNP (last 3 results) No results for input(s): PROBNP  in the last 8760 hours.  CBG: Recent Labs  Lab 07/14/19 1233 07/14/19 1654 07/14/19 2046 07/15/19 0743 07/15/19 1140  GLUCAP 302* 340* 347* 320* 402*    Recent Results (from the past 240 hour(s))  SARS Coronavirus 2 [facility] order, Performed in [facility] hospital lab) Nasopharyngeal Nasopharyngeal Swab     Status: None   Collection Time: 07/14/19  8:11 AM   Specimen: Nasopharyngeal Swab  Result Value Ref Range Status   SARS Coronavirus 2 NEGATIVE NEGATIVE Final    Comment: (NOTE) If result is NEGATIVE SARS-CoV-2 target nucleic acids are NOT DETECTED. The  SARS-CoV-2 RNA is generally detectable in upper and lower  respiratory specimens during the acute phase of infection. The lowest  concentration of SARS-CoV-2 viral copies this assay can detect is 250  copies / mL. A negative result does not preclude SARS-CoV-2 infection  and should not be used as the sole basis for treatment or other  patient management decisions.  A negative result may occur with  improper specimen collection / handling, submission of specimen other  than nasopharyngeal swab, presence of viral mutation(s) within the  areas targeted by this assay, and inadequate number of viral copies  (<250 copies / mL). A negative result must be combined with clinical  observations, patient history, and epidemiological information. If result is POSITIVE SARS-CoV-2 target nucleic acids are DETECTED. The SARS-CoV-2 RNA is generally detectable in upper and lower  respiratory specimens dur ing the acute phase of infection.  Positive  results are indicative of active infection with SARS-CoV-2.  Clinical  correlation with patient history and other diagnostic information is  necessary to determine patient infection status.  Positive results do  not rule out bacterial infection or co-infection with other viruses. If result is PRESUMPTIVE POSTIVE SARS-CoV-2 nucleic acids MAY BE PRESENT.   A presumptive positive result was obtained on the submitted specimen  and confirmed on repeat testing.  While 2019 novel coronavirus  (SARS-CoV-2) nucleic acids may be present in the submitted sample  additional confirmatory testing may be necessary for epidemiological  and / or clinical management purposes  to differentiate between  SARS-CoV-2 and other Sarbecovirus currently known to infect humans.  If clinically indicated additional testing with an alternate test  methodology [phone]) is advised. The SARS-CoV-2 RNA is generally  detectable in upper and lower respiratory sp ecimens during the acute   phase of infection. The expected result is Negative. Fact Sheet for Patients:  StrictlyIdeas.no Fact Sheet for Healthcare Providers: BankingDealers.co.za This test is not yet approved or cleared by the [place] FDA and has been authorized for detection and/or diagnosis of SARS-CoV-2 by FDA under an Emergency Use Authorization (EUA).  This EUA will remain in effect (meaning this test can be used) for the duration of the COVID-19 declaration under Section 564(b)(1) of the Act, 21 U.S.C. section 360bbb-3(b)(1), unless the authorization is terminated or revoked sooner. Performed at [facility], [address] [place],  [address]      Studies: Dg Chest Portable 1 View  Result Date: 07/14/2019 CLINICAL DATA:  Worsening shortness of breath and cough for several days. Emphysema. Congestive heart failure EXAM: PORTABLE CHEST 1 VIEW COMPARISON:  05/22/2019 FINDINGS: Stable cardiomegaly. Aortic atherosclerosis. Increased opacity is seen in the right lung base which may be due to infiltrate or atelectasis. Small right pleural effusion cannot be excluded on this portable exam. Left lung appears clear. IMPRESSION: 1. Increased right basilar atelectasis versus infiltrate. Small right pleural effusion cannot be  excluded. 2. Stable cardiomegaly. Electronically Signed   By: Marlaine Hind M.D.   On: 07/14/2019 08:29    Scheduled Meds: . amiodarone  200 mg Oral Daily  . amLODipine  5 mg Oral Daily  . ammonium lactate   Topical BID  . azithromycin  250 mg Oral Daily  . budesonide (PULMICORT) nebulizer solution  0.5 mg Nebulization BID  . clopidogrel  75 mg Oral Daily  . enoxaparin (LOVENOX) injection  40 mg Subcutaneous Q24H  . ferrous sulfate  325 mg Oral BID WC  . gabapentin  100 mg Oral BID  . hydrALAZINE  50 mg Oral BID  . insulin aspart  0-5 Units Subcutaneous QHS  . insulin aspart  0-9 Units Subcutaneous TID WC  . insulin  glargine  14 Units Subcutaneous QHS  . ipratropium-albuterol  3 mL Nebulization Q6H  . isosorbide mononitrate  30 mg Oral Daily  . methylPREDNISolone (SOLU-MEDROL) injection  40 mg Intravenous BID  . metoprolol  200 mg Oral Daily  . pantoprazole  40 mg Oral Daily  . rosuvastatin  40 mg Oral q1800  . sodium chloride flush  3 mL Intravenous Q12H  . terbinafine   Topical BID   Continuous Infusions: . cefTRIAXone (ROCEPHIN)  IV 2 g (07/15/19 0910)    Assessment/Plan:  1. COPD exacerbation with pneumonia.  Continue Solu-Medrol nebulizer with budesonide and DuoNeb.  Procalcitonin negative so hopefully can have a short course of antibiotic.  Less likely bacterial pneumonia with procalcitonin negative. 2. Chest pain secondary to COPD exacerbation pleuritic pain.  This has resolved 3. Paroxysmal atrial fibrillation on amiodarone and Toprol 4. Type 2 diabetes mellitus on sliding scale and I started low-dose Lantus secondary to sugars being elevated with steroids. 5. Chronic kidney disease stage III.  Continue to monitor closely 6. Hyperlipidemia on Crestor 7. Peripheral vascular disease on Plavix 8. Pulmonary nodule will have to be followed over time 9. Chronic diastolic congestive heart failure.  I do not think this is heart failure at this time continue usual cardiac meds  Code Status:     Code Status Orders  (From admission, onward)         Start     Ordered   07/14/19 1007  Full code  Continuous     07/14/19 1007        Code Status History    Date Active Date Inactive Code Status Order ID Comments User Context   05/23/2019 1908 05/25/2019 1443 DNR [id number]  Myra Rude, RN Inpatient   05/22/2019 1357 05/23/2019 1908 Full Code [id number]  Loletha Grayer, MD ED   02/09/2019 0410 02/12/2019 1734 Full Code [id number]  Arta Silence, MD Inpatient   01/21/2019 1852 01/23/2019 1954 DNR [id number]  Henreitta Leber, MD Inpatient   08/24/2018 0149 08/25/2018 1536 DNR [id number]   Amelia Jo, MD Inpatient   05/29/2018 1513 05/31/2018 1617 DNR [id number]  Gladstone Lighter, MD Inpatient   12/30/2017 1815 01/01/2018 1453 Full Code [id number]  Vaughan Basta, MD Inpatient   12/05/2017 2146 12/08/2017 1428 Full Code [id number]  Fritzi Mandes, MD Inpatient   11/13/2017 2123 11/16/2017 1704 Full Code [id number]  Idelle Crouch, MD Inpatient   09/22/2017 0036 09/24/2017 1837 Full Code [id number]  Salary, Avel Peace, MD Inpatient   09/22/2017 0033 09/22/2017 0036 DNR [id number]  Gorden Harms, MD ED   12/02/2016 0812 12/03/2016 2009 Full Code [id number]  Vaughan Basta, MD Inpatient   07/11/2016 0018 07/11/2016 1707 Full Code [id number]  Lance Coon, MD  Inpatient   06/18/2016 0340 06/21/2016 1844 Full Code [id number]  Saundra Shelling, MD ED   Advance Care Planning Activity     Family Communication: Spoke with friend [name] on the phone Disposition Plan: I hope patient will be ready for discharge tomorrow  Antibiotics:  Rocephin  Zithromax  Time spent: 28 minutes  [place]

## 2019-07-15 NOTE — Progress Notes (Signed)
Respirations less dysneic with no sputum production. Pulse ox at rest 94 % on  RA with HR 77; walking pulse ox 87% RA with HR 91. Education and assessments very limited by pt's limited vision-cannot see to read even-does not track/focus on person. VERY HOH with pt having difficulty hearing well enough to assess accurately. POCT glucose 402 with MD aware with insulin adjustments being done.

## 2019-07-15 NOTE — Evaluation (Signed)
Physical Therapy Evaluation Patient Details Name: Brett [name] MRN: [id number] DOB: [birth year] Today's Date: 07/15/2019   History of Present Illness  Pt admitted for COPD exacerbation with complaints of SOB symptoms. History includes COPD-2L of O2 at home, CHF, HTN, and DM.   Clinical Impression  Pt is a pleasant 84 year old male who was admitted for COPD exacerbation. Pt performs bed mobility independently, transfers with mod I, and ambulation with cga and IV pole. Improved balance with AD. Encouraged AD in home environment due to decreased vision. Pt reports he "doesn't want to depend on AD." All mobility performed on baseline O2 with sats decreased with exertion, no complaints of SOB symptoms. Pt is hoping to dc from hospital soon and reports he has safe home environment/assist at home. Pt is at baseline level. Pt does not require any further PT needs at this time. Pt will be dc in house and does not require follow up. RN aware. Will dc current orders.     Follow Up Recommendations No PT follow up    Equipment Recommendations  (has all equipment)    Recommendations for Other Services       Precautions / Restrictions Precautions Precautions: Fall Restrictions Weight Bearing Restrictions: No      Mobility  Bed Mobility Overal bed mobility: Independent             General bed mobility comments: safe technique without assist needed  Transfers Overall transfer level: Modified independent Equipment used: None             General transfer comment: safe technique, able to push from seated surface  Ambulation/Gait Ambulation/Gait assistance: Min guard Gait Distance (Feet): 15 Feet Assistive device: None Gait Pattern/deviations: Step-through pattern     General Gait Details: Pt ambulated in room to recliner without AD. CGA and slight unsteadiness, however no overt LOB. Pt reports he has difficulty with vision, hard to see obstacles in room. Further ambulation  listed below.  Stairs            Wheelchair Mobility    Modified Rankin (Stroke Patients Only)       Balance Overall balance assessment: Mild deficits observed, not formally tested                                           Pertinent Vitals/Pain Pain Assessment: No/denies pain    Home Living Family/patient expects to be discharged to:: Private residence Living Arrangements: Alone;Non-relatives/Friends Available Help at Discharge: Neighbor;Friend(s) Type of Home: House Home Access: Stairs to enter Entrance Stairs-Rails: Left Entrance Stairs-Number of Steps: 3 STE Home Layout: One level Home Equipment: Walker - 2 wheels;Cane - single point;Toilet riser Additional Comments: very HOH, most of home enviornment obtained from previous evaluations    Prior Function Level of Independence: Independent         Comments: Indep without assist device for basic transfers and gait; denies fall history.  Has access to [facility] and RW if needed, but does "not want to rely on that".  Home O2 at 2L     Hand Dominance        Extremity/Trunk Assessment   Upper Extremity Assessment Upper Extremity Assessment: Overall WFL for tasks assessed    Lower Extremity Assessment Lower Extremity Assessment: Overall WFL for tasks assessed       Communication   Communication: HOH  Cognition Arousal/Alertness: Awake/alert Behavior  During Therapy: WFL for tasks assessed/performed Overall Cognitive Status: Within Functional Limits for tasks assessed                                        General Comments      Exercises Other Exercises Other Exercises: Further ambulation in hallway performed x 200' with cga and IV pole. Recommend use of AD for decrease falls risk, improved balance. Reciprocal gait pattern noted. With exertion, decreased sats at 82%, with seated rest break, improved to 91%. Cued for pursed lip breathing. All mobility performed on 2L of O2.    Assessment/Plan    PT Assessment Patent does not need any further PT services  PT Problem List         PT Treatment Interventions      PT Goals (Current goals can be found in the Care Plan section)  Acute Rehab PT Goals Patient Stated Goal: to go home PT Goal Formulation: All assessment and education complete, DC therapy Time For Goal Achievement: 07/15/19 Potential to Achieve Goals: Good    Frequency     Barriers to discharge        Co-evaluation               AM-PAC PT "6 Clicks" Mobility  Outcome Measure Help needed turning from your back to your side while in a flat bed without using bedrails?: None Help needed moving from lying on your back to sitting on the side of a flat bed without using bedrails?: None Help needed moving to and from a bed to a chair (including a wheelchair)?: None Help needed standing up from a chair using your arms (e.g., wheelchair or bedside chair)?: None Help needed to walk in hospital room?: None Help needed climbing 3-5 steps with a railing? : None 6 Click Score: 24    End of Session Equipment Utilized During Treatment: Gait belt;Oxygen Activity Tolerance: Patient tolerated treatment well Patient left: in chair(no chair alarm box, RN notified.) Nurse Communication: Mobility status PT Visit Diagnosis: Difficulty in walking, not elsewhere classified (R26.2)    Time: 9326-7124 PT Time Calculation (min) (ACUTE ONLY): 20 min   Charges:   PT Evaluation $PT Eval Low Complexity: 1 Low PT Treatments $Gait Training: 8-22 mins        Brett [name], PT, DPT [phone]   Brett [name] 07/15/2019, 11:01 AM

## 2019-07-16 DIAGNOSIS — J441 Chronic obstructive pulmonary disease with (acute) exacerbation: Secondary | ICD-10-CM | POA: Diagnosis not present

## 2019-07-16 DIAGNOSIS — R0602 Shortness of breath: Secondary | ICD-10-CM | POA: Diagnosis not present

## 2019-07-16 LAB — GLUCOSE, CAPILLARY: Glucose-Capillary: 293 mg/dL — ABNORMAL HIGH (ref 70–99)

## 2019-07-16 LAB — PROCALCITONIN: Procalcitonin: <0.1 ng/mL

## 2019-07-16 MED ORDER — GLIMEPIRIDE 2 MG PO TABS
2.0000 mg | ORAL_TABLET | Freq: Two times a day (BID) | ORAL | Status: DC
  Filled 2019-07-16: qty 1

## 2019-07-16 MED ORDER — IPRATROPIUM-ALBUTEROL 0.5-2.5 (3) MG/3ML IN SOLN: RESPIRATORY_TRACT | 0 refills | Status: AC

## 2019-07-16 MED ORDER — CEFDINIR 300 MG PO CAPS
300.0000 mg | ORAL_CAPSULE | Freq: Two times a day (BID) | ORAL | Status: DC
  Administered 2019-07-16: 09:00:00 300 mg via ORAL
  Filled 2019-07-16 (×2): qty 1

## 2019-07-16 MED ORDER — AMIODARONE HCL 200 MG PO TABS: 200.0000 mg | ORAL_TABLET | Freq: Every day | ORAL | 0 refills | Status: DC

## 2019-07-16 MED ORDER — HYDRALAZINE HCL 50 MG PO TABS: 50.0000 mg | ORAL_TABLET | Freq: Two times a day (BID) | ORAL | 0 refills | Status: DC

## 2019-07-16 MED ORDER — PREDNISONE 20 MG PO TABS
40.0000 mg | ORAL_TABLET | Freq: Every day | ORAL | Status: DC
  Administered 2019-07-16: 40 mg via ORAL
  Filled 2019-07-16: qty 2

## 2019-07-16 MED ORDER — CEFDINIR 300 MG PO CAPS: 300.0000 mg | ORAL_CAPSULE | Freq: Two times a day (BID) | ORAL | 0 refills | Status: DC

## 2019-07-16 MED ORDER — GLIMEPIRIDE 2 MG PO TABS
2.0000 mg | ORAL_TABLET | Freq: Every day | ORAL | Status: DC
  Administered 2019-07-16: 2 mg via ORAL
  Filled 2019-07-16: qty 1

## 2019-07-16 MED ORDER — AMMONIUM LACTATE 12 % EX LOTN: TOPICAL_LOTION | Freq: Two times a day (BID) | CUTANEOUS | 0 refills | Status: DC

## 2019-07-16 MED ORDER — AZITHROMYCIN 250 MG PO TABS: ORAL_TABLET | ORAL | 0 refills | Status: AC

## 2019-07-16 MED ORDER — MOMETASONE FURO-FORMOTEROL FUM 100-5 MCG/ACT IN AERO: 2.0000 | INHALATION_SPRAY | Freq: Two times a day (BID) | RESPIRATORY_TRACT | 0 refills | Status: AC

## 2019-07-16 MED ORDER — GLIMEPIRIDE 2 MG PO TABS: 2.0000 mg | ORAL_TABLET | Freq: Every day | ORAL | 0 refills | Status: AC

## 2019-07-16 MED ORDER — PREDNISONE 20 MG PO TABS: ORAL_TABLET | ORAL | 0 refills | Status: AC

## 2019-07-16 MED ORDER — TERBINAFINE HCL 1 % EX CREA: TOPICAL_CREAM | CUTANEOUS | 0 refills | Status: DC

## 2019-07-16 NOTE — Progress Notes (Signed)
Inpatient Diabetes Program Recommendations  AACE/ADA: New Consensus Statement on Inpatient Glycemic Control (2015)  Target Ranges:  Prepandial:   less than 140 mg/dL      Peak postprandial:   less than 180 mg/dL (1-2 hours)      Critically ill patients:  140 - 180 mg/dL   Results for Brett [name], Brett [name] (MRN [id number]) as of 07/16/2019 08:44  Ref. Range 07/15/2019 07:43 07/15/2019 11:40 07/15/2019 16:44 07/15/2019 21:16  Glucose-Capillary Latest Ref Range: 70 - 99 mg/dL 320 (H)  7 units NOVOLOG  402 (H)  15 units NOVOLOG  333 (H)  7 units NOVOLOG  371 (H)  5 units NOVOLOG +  14 units LANTUS   Results for Brett [name], Brett [name] (MRN [id number]) as of 07/16/2019 08:44  Ref. Range 07/16/2019 07:48  Glucose-Capillary Latest Ref Range: 70 - 99 mg/dL 293 (H)  5 units NOVOLOG     Admit: COPD/ Possible PNA  History: DM, CHF   Home DM Meds: Amaryl 2 mg BID   Current Orders: Lantus 14 units QHS       Novolog Sensitive Correction Scale/ SSI (0-9 units) TID AC + HS      Amaryl 2 mg BID     Lantus dose increased last PM.   Amaryl started this AM.   Solumedrol stopped--last dose given yest at 9:30pm. To start Prednisone 40 mg daily today.     MD- Note CBG still quite elevated this AM but Solumedrol has been stopped.    Note that Amaryl 2 mg BID restarted this AM.  May consider stopping the Amaryl (restart at time of d/c home) and instead use Novolog Meal Coverage while patient now getting Prednisone:  Novolog 6 units TID with meals  (Please add the following Hold Parameters: Hold if pt eats <50% of meal, Hold if pt NPO)  Hopefully CBGs will improve now that Solumedrol stopped    --Will follow patient during hospitalization--  Wyn Quaker RN, MSN, CDE Diabetes Coordinator Inpatient Glycemic Control Team Team Pager: [phone] (8a-5p)

## 2019-07-16 NOTE — Progress Notes (Signed)
Patient discharged home per MD order. All discharge instructions given and all questions answered. 

## 2019-07-16 NOTE — TOC Transition Note (Signed)
Transition of Care [facility]) - CM/SW Discharge Note   Patient Details  Name: Brett KAHRS MRN: [id number] Date of Birth: Feb 25, 1935  Transition of Care [facility]) CM/SW Contact:  Polo Mcmartin, Lenice Llamas Phone Number: [phone]  07/16/2019, 11:58 AM   Clinical Narrative: PT is recommending no follow up. Patient is open to [place] for nursing only. Clinical Education officer, museum (CSW) notified [place] representative that patient will D/C home today. RN aware of above. Please reconsult if future social work needs arise. CSW signing off.      Final next level of care: [place] Barriers to Discharge: Barriers Resolved   Patient Goals and CMS Choice Patient states their goals for this hospitalization and ongoing recovery are:: To go home. CMS Medicare.gov Compare Post Acute Care list provided to:: Patient Choice offered to / list presented to : Patient  Discharge Placement                       Discharge Plan and Services     Post Acute Care Choice: Resumption of Svcs/PTA Provider          DME Arranged: N/A         HH Arranged: RN [place] Agency: [place] ([place]) Date [place]: 07/16/19 Time [place]: 0930 Representative spoke with at [place]: [place] ([place]) Interventions     Readmission Risk Interventions No flowsheet data found.

## 2019-07-16 NOTE — Discharge Summary (Signed)
[facility] [place] NAME: Brett [name]    MR#:  [id number]  DATE OF BIRTH:  1935-02-27  DATE OF ADMISSION:  07/14/2019 ADMITTING PHYSICIAN: Loletha Grayer, MD  DATE OF DISCHARGE: 07/16/2019 11:38 AM  PRIMARY CARE PHYSICIAN: Perrin Maltese, MD    ADMISSION DIAGNOSIS:  SOB (shortness of breath) [R06.02] COPD exacerbation (HCC) [J44.1] Essential hypertension [I10]  DISCHARGE DIAGNOSIS:  Active Problems:   COPD exacerbation ([place])   SECONDARY DIAGNOSIS:   Past Medical History:  Diagnosis Date  . Anemia   . Aortic regurgitation   . 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 Bronchitis   . CAD (coronary artery disease)   . Carotid stenosis   . CHF (congestive heart failure) ([place])   . Diabetes mellitus without complication ([place])   . Diabetes mellitus, type 2 ([place])   . Emphysema of lung ([place])   . Essential hypertension   . History of chemotherapy   . Hyperlipidemia   . Hypertension   . IDA (iron deficiency anemia)   . Leg edema   . Meralgia paraesthetica   . Mitral regurgitation   . PUD (peptic ulcer disease)   . PVD (peripheral vascular disease) ([place])   . Tobacco abuse     HOSPITAL COURSE:   1.  COPD exacerbation with the possibility of pneumonia.  Patient received Solu-Medrol and nebulizer treatments during the hospital course.  Procalcitonin negative which goes against a bacterial infection.  The patient was COVID-19 negative upon admission.  Short course of Rocephin and Zithromax which was switched over to [place] and Zithromax upon going home.  2 more days of prednisone upon going home.  I refilled the patient's nebulizer treatment.  Continue usual inhalers. 2.  Chest pain secondary to COPD exacerbation pleuritic chest pain.  This has resolved with the IV steroids. 3.  Paroxysmal atrial fibrillation on amiodarone and Toprol. 4.  Type 2 diabetes mellitus with hyperglycemia with steroids.  I did use low-dose Lantus while  here in the hospital.  The patient is on Amaryl at home I started that today.  Only 2 more days of oral steroids at home.  Last hemoglobin A1c 7.2 so diabetes is under good control. 5.  Chronic kidney disease stage III.  Continue to monitor as outpatient 6.  Hyperlipidemia on Crestor 7.  Peripheral vascular disease on Plavix 8.  Pulmonary nodule.  Seen on CT scan 01/21/2019.  Recommend a repeat CT scan as outpatient within 12 months. 9.  Chronic diastolic congestive heart failure.  No signs of heart failure at this time.  Continue usual medications 10.  Fungal skin infection on feet and fungal toenails.  I did prescribe Lac-Hydrin lotion which helped out with the patient's scaling of his feet.  Prescribed Tinactin for the fungal infection.  He will need to get to a dermatologist to cut his fungal toenails. 11.  Suspicious skin lesion on the right chest.  I referred him to dermatology as outpatient for further evaluation  DISCHARGE CONDITIONS:   Satisfactory  CONSULTS OBTAINED:  None  DRUG ALLERGIES:  No Known Allergies  DISCHARGE MEDICATIONS:   Allergies as of 07/16/2019   No Known Allergies     Medication List    TAKE these medications   albuterol 108 (90 Base) MCG/ACT inhaler Commonly known as: VENTOLIN HFA Inhale 2 puffs into the lungs every 6 (six) hours as needed for wheezing or shortness of breath.   amiodarone 200 MG tablet  Commonly known as: PACERONE Take 1 tablet (200 mg total) by mouth daily. What changed: when to take this   amLODipine 5 MG tablet Commonly known as: NORVASC Take 5 mg by mouth daily.   ammonium lactate 12 % lotion Commonly known as: LAC-HYDRIN Apply topically 2 (two) times daily.   azithromycin 250 MG tablet Commonly known as: ZITHROMAX One tab daily for two days Start taking on: July 17, 2019   cefdinir 300 MG capsule Commonly known as: OMNICEF Take 1 capsule (300 mg total) by mouth every 12 (twelve) hours.   clopidogrel 75 MG  tablet Commonly known as: PLAVIX Take 75 mg by mouth daily.   cyanocobalamin 1000 MCG/ML injection Commonly known as: (VITAMIN B-12) Inject 1,000 mcg into the muscle every 30 (thirty) days.   ferrous sulfate 325 (65 FE) MG tablet Take 1 tablet (325 mg total) by mouth 2 (two) times daily with a meal.   furosemide 20 MG tablet Commonly known as: LASIX Take 20 mg by mouth daily.   gabapentin 100 MG capsule Commonly known as: NEURONTIN Take 1 capsule by mouth 2 (two) times daily.   glimepiride 2 MG tablet Commonly known as: AMARYL Take 1 tablet (2 mg total) by mouth daily with breakfast. What changed: when to take this   hydrALAZINE 50 MG tablet Commonly known as: APRESOLINE Take 1 tablet (50 mg total) by mouth 2 (two) times daily. What changed:   medication strength  how much to take   ipratropium-albuterol 0.5-2.5 (3) MG/3ML Soln Commonly known as: DUONEB J44.1  29ml nebulizer every six hours as needed for shortness of breath What changed:   how much to take  how to take this  when to take this  reasons to take this  additional instructions   isosorbide mononitrate 30 MG 24 hr tablet Commonly known as: IMDUR Take 30 mg by mouth daily.   metoprolol 200 MG 24 hr tablet Commonly known as: TOPROL-XL Take 1 tablet (200 mg total) by mouth daily.   mometasone-formoterol 100-5 MCG/ACT Aero Commonly known as: DULERA Inhale 2 puffs into the lungs 2 (two) times daily. What changed:   when to take this  reasons to take this   pantoprazole 40 MG tablet Commonly known as: PROTONIX Take 40 mg by mouth daily.   predniSONE 20 MG tablet Commonly known as: DELTASONE Two tabs po daily for two days Start taking on: July 17, 2019   rosuvastatin 40 MG tablet Commonly known as: CRESTOR Take 40 mg by mouth daily.   terbinafine 1 % cream Commonly known as: LAMISIL Apply twice a day to feet (okay to substitute any anti fungal cream that is the cheapest)    tiotropium 18 MCG inhalation capsule Commonly known as: Spiriva HandiHaler Place 1 capsule (18 mcg total) into inhaler and inhale daily.   Vitamin D (Ergocalciferol) 1.25 MG (50000 UT) Caps capsule Commonly known as: DRISDOL Take 50,000 Units by mouth once a week.        DISCHARGE INSTRUCTIONS:   Follow-up PMD 5 days Follow-up podiatry to cut toenails Follow-up dermatology for suspicious skin lesion  If you experience worsening of your admission symptoms, develop shortness of breath, life threatening emergency, suicidal or homicidal thoughts you must seek medical attention immediately by calling 911 or calling your MD immediately  if symptoms less severe.  You Must read complete instructions/literature along with all the possible adverse reactions/side effects for all the Medicines you take and that have been prescribed to you. Take any new  Medicines after you have completely understood and accept all the possible adverse reactions/side effects.   Please note  You were cared for by a hospitalist during your hospital stay. If you have any questions about your discharge medications or the care you received while you were in the hospital after you are discharged, you can call the unit and asked to speak with the hospitalist on call if the hospitalist that took care of you is not available. Once you are discharged, your primary care physician will handle any further medical issues. Please note that NO REFILLS for any discharge medications will be authorized once you are discharged, as it is imperative that you return to your primary care physician (or establish a relationship with a primary care physician if you do not have one) for your aftercare needs so that they can reassess your need for medications and monitor your lab values.    Today   CHIEF COMPLAINT:   Chief Complaint  Patient presents with  . Shortness of Breath    HISTORY OF PRESENT ILLNESS:  Brett [name]  is a 84  y.o. male came in with shortness of breath   VITAL SIGNS:  Blood pressure (!) 147/71, pulse 79, temperature 98 F (36.7 C), resp. rate 18, height 5\' 10"  (1.778 m), weight 90.7 kg, SpO2 94 %.    PHYSICAL EXAMINATION:  GENERAL:  84 y.o.-year-old patient lying in the bed with no acute distress.  EYES: Pupils equal, round, reactive to light and accommodation. No scleral icterus. Extraocular muscles intact.  HEENT: Head atraumatic, normocephalic. Oropharynx and nasopharynx clear.  NECK:  Supple, no jugular venous distention. No thyroid enlargement, no tenderness.  LUNGS: Decreased breath sounds bilaterally, no wheezing, rales,rhonchi or crepitation. No use of accessory muscles of respiration.  CARDIOVASCULAR: S1, S2 normal. No murmurs, rubs, or gallops.  ABDOMEN: Soft, non-tender, non-distended. Bowel sounds present. No organomegaly or mass.  EXTREMITIES: No pedal edema, cyanosis, or clubbing.  NEUROLOGIC: Cranial nerves II through XII are intact. Muscle strength 5/5 in all extremities. Sensation intact. Gait not checked.  PSYCHIATRIC: The patient is alert and oriented x 3.  SKIN: No obvious rash, lesion, or ulcer.   DATA REVIEW:   CBC Recent Labs  Lab 07/15/19 0536  WBC 11.1*  HGB 9.2*  HCT 29.2*  PLT 164    Chemistries  Recent Labs  Lab 07/15/19 0536  NA 137  K 4.5  CL 103  CO2 27  GLUCOSE 331*  BUN 47*  CREATININE 2.05*  CALCIUM 8.4*     Microbiology Results  Results for orders placed or performed during the hospital encounter of 07/14/19  SARS Coronavirus 2 [facility] order, Performed in [facility] hospital lab) Nasopharyngeal Nasopharyngeal Swab     Status: None   Collection Time: 07/14/19  8:11 AM   Specimen: Nasopharyngeal Swab  Result Value Ref Range Status   SARS Coronavirus 2 NEGATIVE NEGATIVE Final    Comment: (NOTE) If result is NEGATIVE SARS-CoV-2 target nucleic acids are NOT DETECTED. The SARS-CoV-2 RNA is generally detectable in upper and lower   respiratory specimens during the acute phase of infection. The lowest  concentration of SARS-CoV-2 viral copies this assay can detect is 250  copies / mL. A negative result does not preclude SARS-CoV-2 infection  and should not be used as the sole basis for treatment or other  patient management decisions.  A negative result may occur with  improper specimen collection / handling, submission of specimen other  than nasopharyngeal swab, presence  of viral mutation(s) within the  areas targeted by this assay, and inadequate number of viral copies  (<250 copies / mL). A negative result must be combined with clinical  observations, patient history, and epidemiological information. If result is POSITIVE SARS-CoV-2 target nucleic acids are DETECTED. The SARS-CoV-2 RNA is generally detectable in upper and lower  respiratory specimens dur ing the acute phase of infection.  Positive  results are indicative of active infection with SARS-CoV-2.  Clinical  correlation with patient history and other diagnostic information is  necessary to determine patient infection status.  Positive results do  not rule out bacterial infection or co-infection with other viruses. If result is PRESUMPTIVE POSTIVE SARS-CoV-2 nucleic acids MAY BE PRESENT.   A presumptive positive result was obtained on the submitted specimen  and confirmed on repeat testing.  While 2019 novel coronavirus  (SARS-CoV-2) nucleic acids may be present in the submitted sample  additional confirmatory testing may be necessary for epidemiological  and / or clinical management purposes  to differentiate between  SARS-CoV-2 and other Sarbecovirus currently known to infect humans.  If clinically indicated additional testing with an alternate test  methodology [phone]) is advised. The SARS-CoV-2 RNA is generally  detectable in upper and lower respiratory sp ecimens during the acute  phase of infection. The expected result is Negative. Fact  Sheet for Patients:  StrictlyIdeas.no Fact Sheet for Healthcare Providers: BankingDealers.co.za This test is not yet approved or cleared by the [place] FDA and has been authorized for detection and/or diagnosis of SARS-CoV-2 by FDA under an Emergency Use Authorization (EUA).  This EUA will remain in effect (meaning this test can be used) for the duration of the COVID-19 declaration under Section 564(b)(1) of the Act, 21 U.S.C. section 360bbb-3(b)(1), unless the authorization is terminated or revoked sooner. Performed at [facility], [address], [place], [place] [address]       Management plans discussed with the patient, friend and they are in agreement.  CODE STATUS:     Code Status Orders  (From admission, onward)         Start     Ordered   07/14/19 1007  Full code  Continuous     07/14/19 1007        Code Status History    Date Active Date Inactive Code Status Order ID Comments User Context   05/23/2019 1908 05/25/2019 1443 DNR [id number]  Myra Rude, RN Inpatient   05/22/2019 1357 05/23/2019 1908 Full Code [id number]  Loletha Grayer, MD ED   02/09/2019 0410 02/12/2019 1734 Full Code [id number]  Arta Silence, MD Inpatient   01/21/2019 1852 01/23/2019 1954 DNR [id number]  Henreitta Leber, MD Inpatient   08/24/2018 0149 08/25/2018 1536 DNR [id number]  Amelia Jo, MD Inpatient   05/29/2018 1513 05/31/2018 1617 DNR [id number]  Gladstone Lighter, MD Inpatient   12/30/2017 1815 01/01/2018 1453 Full Code [id number]  Vaughan Basta, MD Inpatient   12/05/2017 2146 12/08/2017 1428 Full Code [id number]  Fritzi Mandes, MD Inpatient   11/13/2017 2123 11/16/2017 1704 Full Code [id number]  Idelle Crouch, MD Inpatient   09/22/2017 0036 09/24/2017 1837 Full Code [id number]  Salary, Avel Peace, MD Inpatient   09/22/2017 0033 09/22/2017 0036 DNR [id number]  Gorden Harms, MD ED   12/02/2016 0812 12/03/2016 2009 Full Code  [id number]  Vaughan Basta, MD Inpatient   07/11/2016 0018 07/11/2016 1707 Full Code [id number]  Lance Coon, MD Inpatient   06/18/2016 0340 06/21/2016 1844 Full Code [id number]  Saundra Shelling, MD ED   Advance Care Planning Activity      TOTAL TIME TAKING CARE OF THIS PATIENT: 35 minutes.    Loletha Grayer M.D on 07/16/2019 at 1:15 PM  Between 7am to 6pm - Pager - [phone]  After 6pm go to [email or web address] - password [email or web address] [facility]  [phone]  CC: Primary care physician; Perrin Maltese, MD

## 2019-07-16 NOTE — Discharge Instructions (Signed)
Chronic Obstructive Pulmonary Disease °Chronic obstructive pulmonary disease (COPD) is a long-term (chronic) lung problem. When you have COPD, it is hard for air to get in and out of your lungs. Usually the condition gets worse over time, and your lungs will never return to normal. There are things you can do to keep yourself as healthy as possible. °· Your doctor may treat your condition with: °? Medicines. °? Oxygen. °? Lung surgery. °· Your doctor may also recommend: °? Rehabilitation. This includes steps to make your body work better. It may involve a team of specialists. °? Quitting smoking, if you smoke. °? Exercise and changes to your diet. °? Comfort measures (palliative care). °Follow these instructions at home: °Medicines °· Take over-the-counter and prescription medicines only as told by your doctor. °· Talk to your doctor before taking any cough or allergy medicines. You may need to avoid medicines that cause your lungs to be dry. °Lifestyle °· If you smoke, stop. Smoking makes the problem worse. If you need help quitting, ask your doctor. °· Avoid being around things that make your breathing worse. This may include smoke, chemicals, and fumes. °· Stay active, but remember to rest as well. °· Learn and use tips on how to relax. °· Make sure you get enough sleep. Most adults need at least 7 hours of sleep every night. °· Eat healthy foods. Eat smaller meals more often. Rest before meals. °Controlled breathing °Learn and use tips on how to control your breathing as told by your doctor. Try: °· Breathing in (inhaling) through your nose for 1 second. Then, pucker your lips and breath out (exhale) through your lips for 2 seconds. °· Putting one hand on your belly (abdomen). Breathe in slowly through your nose for 1 second. Your hand on your belly should move out. Pucker your lips and breathe out slowly through your lips. Your hand on your belly should move in as you breathe out. ° °Controlled coughing °Learn  and use controlled coughing to clear mucus from your lungs. Follow these steps: °1. Lean your head a little forward. °2. Breathe in deeply. °3. Try to hold your breath for 3 seconds. °4. Keep your mouth slightly open while coughing 2 times. °5. Spit any mucus out into a tissue. °6. Rest and do the steps again 1 or 2 times as needed. °General instructions °· Make sure you get all the shots (vaccines) that your doctor recommends. Ask your doctor about a flu shot and a pneumonia shot. °· Use oxygen therapy and pulmonary rehabilitation if told by your doctor. If you need home oxygen therapy, ask your doctor if you should buy a tool to measure your oxygen level (oximeter). °· Make a COPD action plan with your doctor. This helps you to know what to do if you feel worse than usual. °· Manage any other conditions you have as told by your doctor. °· Avoid going outside when it is very hot, cold, or humid. °· Avoid people who have a sickness you can catch (contagious). °· Keep all follow-up visits as told by your doctor. This is important. °Contact a doctor if: °· You cough up more mucus than usual. °· There is a change in the color or thickness of the mucus. °· It is harder to breathe than usual. °· Your breathing is faster than usual. °· You have trouble sleeping. °· You need to use your medicines more often than usual. °· You have trouble doing your normal activities such as getting dressed   or walking around the house. Get help right away if:  You have shortness of breath while resting.  You have shortness of breath that stops you from: ? Being able to talk. ? Doing normal activities.  Your chest hurts for longer than 5 minutes.  Your skin color is more blue than usual.  Your pulse oximeter shows that you have low oxygen for longer than 5 minutes.  You have a fever.  You feel too tired to breathe normally. Summary  Chronic obstructive pulmonary disease (COPD) is a long-term lung problem.  The way your  lungs work will never return to normal. Usually the condition gets worse over time. There are things you can do to keep yourself as healthy as possible.  Take over-the-counter and prescription medicines only as told by your doctor.  If you smoke, stop. Smoking makes the problem worse. This information is not intended to replace advice given to you by your health care provider. Make sure you discuss any questions you have with your health care provider. Document Released: 05/03/2008 Document Revised: 10/28/2017 Document Reviewed: 12/20/2016 Elsevier Patient Education  2020 [employer].   Chronic Obstructive Pulmonary Disease Exacerbation  Chronic obstructive pulmonary disease (COPD) is a long-term (chronic) condition that affects the lungs. COPD is a general term that can be used to describe many different lung problems that cause lung swelling (inflammation) and limit airflow, including chronic bronchitis and emphysema. COPD exacerbations are episodes when breathing symptoms become much worse and require extra treatment. COPD exacerbations are usually caused by infections. Without treatment, COPD exacerbations can be severe and even life threatening. Frequent COPD exacerbations can cause further damage to the lungs. What are the causes? This condition may be caused by:  Respiratory infections, including viral and bacterial infections.  Exposure to smoke.  Exposure to air pollution, chemical fumes, or dust.  Things that give you an allergic reaction (allergens).  Not taking your usual COPD medicines as directed.  Underlying medical problems, such as congestive heart failure or infections not involving the lungs. In many cases, the cause (trigger) of this condition is not known. What increases the risk? The following factors may make you more likely to develop this condition:  Smoking cigarettes.  Old age.  Frequent prior COPD exacerbations. What are the signs or  symptoms? Symptoms of this condition include:  Increased coughing.  Increased production of mucus from your lungs (sputum).  Increased wheezing.  Increased shortness of breath.  Rapid or labored breathing.  Chest tightness.  Less energy than usual.  Sleep disruption from symptoms.  Confusion or increased sleepiness. Often these symptoms happen or get worse even with the use of medicines. How is this diagnosed? This condition is diagnosed based on:  Your medical history.  A physical exam. You may also have tests, including:  A chest X-ray.  Blood tests.  Lung (pulmonary) function tests. How is this treated? Treatment for this condition depends on the severity and cause of the symptoms. You may need to be admitted to a hospital for treatment. Some of the treatments commonly used to treat COPD exacerbations are:  Antibiotic medicines. These may be used for severe exacerbations caused by a lung infection, such as pneumonia.  Bronchodilators. These are inhaled medicines that expand the air passages and allow increased airflow.  Steroid medicines. These act to reduce inflammation in the airways. They may be given with an inhaler, taken by mouth, or given through an IV tube inserted into one of your veins.  Supplemental  oxygen therapy.  Airway clearing techniques, such as noninvasive ventilation (NIV) and positive expiratory pressure (PEP). These provide respiratory support through a mask or other noninvasive device. An example of this would be using a continuous positive airway pressure (CPAP) machine to improve delivery of oxygen into your lungs. Follow these instructions at home: Medicines  Take over-the-counter and prescription medicines only as told by your health care provider. It is important to use correct technique with inhaled medicines.  If you were prescribed an antibiotic medicine or oral steroid, take it as told by your health care provider. Do not stop  taking the medicine even if you start to feel better. Lifestyle  Eat a healthy diet.  Exercise regularly.  Get plenty of sleep.  Avoid exposure to all substances that irritate the airway, especially to tobacco smoke.  Wash your hands often with soap and water to reduce the risk of infection. If soap and water are not available, use hand sanitizer.  During flu season, avoid enclosed spaces that are crowded with people. General instructions  Drink enough fluid to keep your urine clear or pale yellow (unless you have a medical condition that requires fluid restriction).  Use a cool mist vaporizer. This humidifies the air and makes it easier for you to clear your chest when you cough.  If you have a home nebulizer and oxygen, continue to use them as told by your health care provider.  Keep all follow-up visits as told by your health care provider. This is important. How is this prevented?  Stay up-to-date on pneumococcal and influenza (flu) vaccines. A flu shot is recommended every year to help prevent exacerbations.  Do not use any products that contain nicotine or tobacco, such as cigarettes and e-cigarettes. Quitting smoking is very important in preventing COPD from getting worse and in preventing exacerbations from happening as often. If you need help quitting, ask your health care provider.  Follow all instructions for pulmonary rehabilitation after a recent exacerbation. This can help prevent future exacerbations.  Work with your health care provider to develop and follow an action plan. This tells you what steps to take when you experience certain symptoms. Contact a health care provider if:  You have a worsening of your regular COPD symptoms. Get help right away if:  You have worsening shortness of breath, even when resting.  You have trouble talking.  You have severe chest pain.  You cough up blood.  You have a fever.  You have weakness, vomit repeatedly, or  faint.  You feel confused.  You are not able to sleep because of your symptoms.  You have trouble doing daily activities. Summary  COPD exacerbations are episodes when breathing symptoms become much worse and require extra treatment above your normal treatment.  Exacerbations can be severe and even life threatening. Frequent COPD exacerbations can cause further damage to your lungs.  COPD exacerbations are usually triggered by infections such as the flu, colds, and even pneumonia.  Treatment for this condition depends on the severity and cause of the symptoms. You may need to be admitted to a hospital for treatment.  Quitting smoking is very important to prevent COPD from getting worse and to prevent exacerbations from happening as often. This information is not intended to replace advice given to you by your health care provider. Make sure you discuss any questions you have with your health care provider. Document Released: 09/12/2007 Document Revised: 10/28/2017 Document Reviewed: 12/20/2016 Elsevier Patient Education  2020 [employer].  Chronic Obstructive Pulmonary Disease Exacerbation  Chronic obstructive pulmonary disease (COPD) is a long-term (chronic) condition that affects the lungs. COPD is a general term that can be used to describe many different lung problems that cause lung swelling (inflammation) and limit airflow, including chronic bronchitis and emphysema. COPD exacerbations are episodes when breathing symptoms become much worse and require extra treatment. COPD exacerbations are usually caused by infections. Without treatment, COPD exacerbations can be severe and even life threatening. Frequent COPD exacerbations can cause further damage to the lungs. What are the causes? This condition may be caused by:  Respiratory infections, including viral and bacterial infections.  Exposure to smoke.  Exposure to air pollution, chemical fumes, or dust.  Things that give  you an allergic reaction (allergens).  Not taking your usual COPD medicines as directed.  Underlying medical problems, such as congestive heart failure or infections not involving the lungs. In many cases, the cause (trigger) of this condition is not known. What increases the risk? The following factors may make you more likely to develop this condition:  Smoking cigarettes.  Old age.  Frequent prior COPD exacerbations. What are the signs or symptoms? Symptoms of this condition include:  Increased coughing.  Increased production of mucus from your lungs (sputum).  Increased wheezing.  Increased shortness of breath.  Rapid or labored breathing.  Chest tightness.  Less energy than usual.  Sleep disruption from symptoms.  Confusion or increased sleepiness. Often these symptoms happen or get worse even with the use of medicines. How is this diagnosed? This condition is diagnosed based on:  Your medical history.  A physical exam. You may also have tests, including:  A chest X-ray.  Blood tests.  Lung (pulmonary) function tests. How is this treated? Treatment for this condition depends on the severity and cause of the symptoms. You may need to be admitted to a hospital for treatment. Some of the treatments commonly used to treat COPD exacerbations are:  Antibiotic medicines. These may be used for severe exacerbations caused by a lung infection, such as pneumonia.  Bronchodilators. These are inhaled medicines that expand the air passages and allow increased airflow.  Steroid medicines. These act to reduce inflammation in the airways. They may be given with an inhaler, taken by mouth, or given through an IV tube inserted into one of your veins.  Supplemental oxygen therapy.  Airway clearing techniques, such as noninvasive ventilation (NIV) and positive expiratory pressure (PEP). These provide respiratory support through a mask or other noninvasive device. An example  of this would be using a continuous positive airway pressure (CPAP) machine to improve delivery of oxygen into your lungs. Follow these instructions at home: Medicines  Take over-the-counter and prescription medicines only as told by your health care provider. It is important to use correct technique with inhaled medicines.  If you were prescribed an antibiotic medicine or oral steroid, take it as told by your health care provider. Do not stop taking the medicine even if you start to feel better. Lifestyle  Eat a healthy diet.  Exercise regularly.  Get plenty of sleep.  Avoid exposure to all substances that irritate the airway, especially to tobacco smoke.  Wash your hands often with soap and water to reduce the risk of infection. If soap and water are not available, use hand sanitizer.  During flu season, avoid enclosed spaces that are crowded with people. General instructions  Drink enough fluid to keep your urine clear or pale yellow (unless you have a  medical condition that requires fluid restriction).  Use a cool mist vaporizer. This humidifies the air and makes it easier for you to clear your chest when you cough.  If you have a home nebulizer and oxygen, continue to use them as told by your health care provider.  Keep all follow-up visits as told by your health care provider. This is important. How is this prevented?  Stay up-to-date on pneumococcal and influenza (flu) vaccines. A flu shot is recommended every year to help prevent exacerbations.  Do not use any products that contain nicotine or tobacco, such as cigarettes and e-cigarettes. Quitting smoking is very important in preventing COPD from getting worse and in preventing exacerbations from happening as often. If you need help quitting, ask your health care provider.  Follow all instructions for pulmonary rehabilitation after a recent exacerbation. This can help prevent future exacerbations.  Work with your health  care provider to develop and follow an action plan. This tells you what steps to take when you experience certain symptoms. Contact a health care provider if:  You have a worsening of your regular COPD symptoms. Get help right away if:  You have worsening shortness of breath, even when resting.  You have trouble talking.  You have severe chest pain.  You cough up blood.  You have a fever.  You have weakness, vomit repeatedly, or faint.  You feel confused.  You are not able to sleep because of your symptoms.  You have trouble doing daily activities. Summary  COPD exacerbations are episodes when breathing symptoms become much worse and require extra treatment above your normal treatment.  Exacerbations can be severe and even life threatening. Frequent COPD exacerbations can cause further damage to your lungs.  COPD exacerbations are usually triggered by infections such as the flu, colds, and even pneumonia.  Treatment for this condition depends on the severity and cause of the symptoms. You may need to be admitted to a hospital for treatment.  Quitting smoking is very important to prevent COPD from getting worse and to prevent exacerbations from happening as often. This information is not intended to replace advice given to you by your health care provider. Make sure you discuss any questions you have with your health care provider. Document Released: 09/12/2007 Document Revised: 10/28/2017 Document Reviewed: 12/20/2016 Elsevier Patient Education  2020 [employer].

## 2019-07-24 LAB — HIV ANTIBODY (ROUTINE TESTING W REFLEX): HIV Screen 4th Generation wRfx: NONREACTIVE

## 2019-11-25 ENCOUNTER — Emergency Department: Payer: Medicare PPO

## 2019-11-25 ENCOUNTER — Observation Stay: Payer: Medicare PPO

## 2019-11-25 ENCOUNTER — Encounter: Payer: Self-pay | Admitting: Emergency Medicine

## 2019-11-25 ENCOUNTER — Inpatient Hospital Stay
Admission: EM | Admit: 2019-11-25 | Discharge: 2019-12-05 | DRG: 871 | Disposition: A | Discharge status: Skilled Nursing Facility | Payer: Medicare PPO | Attending: Internal Medicine | Admitting: Internal Medicine

## 2019-11-25 ENCOUNTER — Other Ambulatory Visit: Payer: Self-pay

## 2019-11-25 DIAGNOSIS — E119 Type 2 diabetes mellitus without complications: Secondary | ICD-10-CM

## 2019-11-25 DIAGNOSIS — N179 Acute kidney failure, unspecified: Secondary | ICD-10-CM | POA: Diagnosis present

## 2019-11-25 DIAGNOSIS — K279 Peptic ulcer, site unspecified, unspecified as acute or chronic, without hemorrhage or perforation: Secondary | ICD-10-CM | POA: Diagnosis present

## 2019-11-25 DIAGNOSIS — N1832 Chronic kidney disease, stage 3b: Secondary | ICD-10-CM | POA: Diagnosis present

## 2019-11-25 DIAGNOSIS — E860 Dehydration: Secondary | ICD-10-CM | POA: Diagnosis present

## 2019-11-25 DIAGNOSIS — R4701 Aphasia: Secondary | ICD-10-CM | POA: Diagnosis present

## 2019-11-25 DIAGNOSIS — I480 Paroxysmal atrial fibrillation: Secondary | ICD-10-CM

## 2019-11-25 DIAGNOSIS — D509 Iron deficiency anemia, unspecified: Secondary | ICD-10-CM | POA: Diagnosis present

## 2019-11-25 DIAGNOSIS — C8510 Unspecified B-cell lymphoma, unspecified site: Secondary | ICD-10-CM | POA: Diagnosis present

## 2019-11-25 DIAGNOSIS — Z978 Presence of other specified devices: Secondary | ICD-10-CM

## 2019-11-25 DIAGNOSIS — J449 Chronic obstructive pulmonary disease, unspecified: Secondary | ICD-10-CM | POA: Diagnosis present

## 2019-11-25 DIAGNOSIS — H5461 Unqualified visual loss, right eye, normal vision left eye: Secondary | ICD-10-CM | POA: Diagnosis present

## 2019-11-25 DIAGNOSIS — R0682 Tachypnea, not elsewhere classified: Secondary | ICD-10-CM

## 2019-11-25 DIAGNOSIS — Z20822 Contact with and (suspected) exposure to covid-19: Secondary | ICD-10-CM | POA: Diagnosis present

## 2019-11-25 DIAGNOSIS — E1122 Type 2 diabetes mellitus with diabetic chronic kidney disease: Secondary | ICD-10-CM

## 2019-11-25 DIAGNOSIS — I083 Combined rheumatic disorders of mitral, aortic and tricuspid valves: Secondary | ICD-10-CM | POA: Diagnosis present

## 2019-11-25 DIAGNOSIS — K0889 Other specified disorders of teeth and supporting structures: Secondary | ICD-10-CM | POA: Diagnosis present

## 2019-11-25 DIAGNOSIS — Z9221 Personal history of antineoplastic chemotherapy: Secondary | ICD-10-CM

## 2019-11-25 DIAGNOSIS — A419 Sepsis, unspecified organism: Secondary | ICD-10-CM | POA: Diagnosis not present

## 2019-11-25 DIAGNOSIS — E870 Hyperosmolality and hypernatremia: Secondary | ICD-10-CM

## 2019-11-25 DIAGNOSIS — R140 Abdominal distension (gaseous): Secondary | ICD-10-CM

## 2019-11-25 DIAGNOSIS — R471 Dysarthria and anarthria: Secondary | ICD-10-CM | POA: Diagnosis present

## 2019-11-25 DIAGNOSIS — I10 Essential (primary) hypertension: Secondary | ICD-10-CM

## 2019-11-25 DIAGNOSIS — J439 Emphysema, unspecified: Secondary | ICD-10-CM | POA: Diagnosis present

## 2019-11-25 DIAGNOSIS — Z8249 Family history of ischemic heart disease and other diseases of the circulatory system: Secondary | ICD-10-CM

## 2019-11-25 DIAGNOSIS — J9601 Acute respiratory failure with hypoxia: Secondary | ICD-10-CM | POA: Diagnosis present

## 2019-11-25 DIAGNOSIS — J189 Pneumonia, unspecified organism: Secondary | ICD-10-CM | POA: Diagnosis not present

## 2019-11-25 DIAGNOSIS — Z0189 Encounter for other specified special examinations: Secondary | ICD-10-CM

## 2019-11-25 DIAGNOSIS — I69991 Dysphagia following unspecified cerebrovascular disease: Secondary | ICD-10-CM

## 2019-11-25 DIAGNOSIS — G9340 Encephalopathy, unspecified: Secondary | ICD-10-CM

## 2019-11-25 DIAGNOSIS — Z87891 Personal history of nicotine dependence: Secondary | ICD-10-CM

## 2019-11-25 DIAGNOSIS — J441 Chronic obstructive pulmonary disease with (acute) exacerbation: Secondary | ICD-10-CM | POA: Diagnosis present

## 2019-11-25 DIAGNOSIS — E1151 Type 2 diabetes mellitus with diabetic peripheral angiopathy without gangrene: Secondary | ICD-10-CM | POA: Diagnosis present

## 2019-11-25 DIAGNOSIS — E871 Hypo-osmolality and hyponatremia: Secondary | ICD-10-CM | POA: Diagnosis present

## 2019-11-25 DIAGNOSIS — R748 Abnormal levels of other serum enzymes: Secondary | ICD-10-CM | POA: Diagnosis present

## 2019-11-25 DIAGNOSIS — E1100 Type 2 diabetes mellitus with hyperosmolarity without nonketotic hyperglycemic-hyperosmolar coma (NKHHC): Secondary | ICD-10-CM

## 2019-11-25 DIAGNOSIS — I482 Chronic atrial fibrillation, unspecified: Secondary | ICD-10-CM | POA: Diagnosis present

## 2019-11-25 DIAGNOSIS — Z79899 Other long term (current) drug therapy: Secondary | ICD-10-CM

## 2019-11-25 DIAGNOSIS — I5033 Acute on chronic diastolic (congestive) heart failure: Secondary | ICD-10-CM | POA: Diagnosis not present

## 2019-11-25 DIAGNOSIS — E785 Hyperlipidemia, unspecified: Secondary | ICD-10-CM | POA: Diagnosis present

## 2019-11-25 DIAGNOSIS — I5032 Chronic diastolic (congestive) heart failure: Secondary | ICD-10-CM

## 2019-11-25 DIAGNOSIS — I2510 Atherosclerotic heart disease of native coronary artery without angina pectoris: Secondary | ICD-10-CM | POA: Diagnosis not present

## 2019-11-25 DIAGNOSIS — J432 Centrilobular emphysema: Secondary | ICD-10-CM

## 2019-11-25 DIAGNOSIS — Z7951 Long term (current) use of inhaled steroids: Secondary | ICD-10-CM

## 2019-11-25 DIAGNOSIS — R1310 Dysphagia, unspecified: Secondary | ICD-10-CM | POA: Diagnosis not present

## 2019-11-25 DIAGNOSIS — R29810 Facial weakness: Secondary | ICD-10-CM | POA: Diagnosis not present

## 2019-11-25 DIAGNOSIS — G9341 Metabolic encephalopathy: Secondary | ICD-10-CM

## 2019-11-25 DIAGNOSIS — I130 Hypertensive heart and chronic kidney disease with heart failure and stage 1 through stage 4 chronic kidney disease, or unspecified chronic kidney disease: Secondary | ICD-10-CM | POA: Diagnosis present

## 2019-11-25 DIAGNOSIS — E875 Hyperkalemia: Secondary | ICD-10-CM | POA: Diagnosis present

## 2019-11-25 DIAGNOSIS — Z7902 Long term (current) use of antithrombotics/antiplatelets: Secondary | ICD-10-CM

## 2019-11-25 DIAGNOSIS — N183 Chronic kidney disease, stage 3 unspecified: Secondary | ICD-10-CM

## 2019-11-25 DIAGNOSIS — Z66 Do not resuscitate: Secondary | ICD-10-CM | POA: Diagnosis present

## 2019-11-25 DIAGNOSIS — R4182 Altered mental status, unspecified: Secondary | ICD-10-CM | POA: Diagnosis not present

## 2019-11-25 DIAGNOSIS — Z8673 Personal history of transient ischemic attack (TIA), and cerebral infarction without residual deficits: Secondary | ICD-10-CM

## 2019-11-25 DIAGNOSIS — G4733 Obstructive sleep apnea (adult) (pediatric): Secondary | ICD-10-CM | POA: Diagnosis present

## 2019-11-25 DIAGNOSIS — H9190 Unspecified hearing loss, unspecified ear: Secondary | ICD-10-CM | POA: Diagnosis present

## 2019-11-25 DIAGNOSIS — Z8701 Personal history of pneumonia (recurrent): Secondary | ICD-10-CM

## 2019-11-25 DIAGNOSIS — G459 Transient cerebral ischemic attack, unspecified: Secondary | ICD-10-CM

## 2019-11-25 DIAGNOSIS — I639 Cerebral infarction, unspecified: Secondary | ICD-10-CM | POA: Diagnosis present

## 2019-11-25 DIAGNOSIS — Z7984 Long term (current) use of oral hypoglycemic drugs: Secondary | ICD-10-CM

## 2019-11-25 LAB — CBC WITH DIFFERENTIAL/PLATELET
Abs Immature Granulocytes: 0.03 10*3/uL (ref 0.00–0.07)
Basophils Absolute: 0 10*3/uL (ref 0.0–0.1)
Basophils Relative: 1 %
Eosinophils Absolute: 0.2 10*3/uL (ref 0.0–0.5)
Eosinophils Relative: 4 %
HCT: 32.1 % — ABNORMAL LOW (ref 39.0–52.0)
Hemoglobin: 10.7 g/dL — ABNORMAL LOW (ref 13.0–17.0)
Immature Granulocytes: 1 %
Lymphocytes Relative: 11 %
Lymphs Abs: 0.7 10*3/uL (ref 0.7–4.0)
MCH: 27.8 pg (ref 26.0–34.0)
MCHC: 33.3 g/dL (ref 30.0–36.0)
MCV: 83.4 fL (ref 80.0–100.0)
Monocytes Absolute: 0.6 10*3/uL (ref 0.1–1.0)
Monocytes Relative: 10 %
Neutro Abs: 4.6 10*3/uL (ref 1.7–7.7)
Neutrophils Relative %: 73 %
Platelets: 163 10*3/uL (ref 150–400)
RBC: 3.85 MIL/uL — ABNORMAL LOW (ref 4.22–5.81)
RDW: 14.5 % (ref 11.5–15.5)
WBC: 6.2 10*3/uL (ref 4.0–10.5)
nRBC: 0 % (ref 0.0–0.2)

## 2019-11-25 LAB — BETA-HYDROXYBUTYRIC ACID: Beta-Hydroxybutyric Acid: 0.7 mmol/L — ABNORMAL HIGH (ref 0.05–0.27)

## 2019-11-25 LAB — URINALYSIS, COMPLETE (UACMP) WITH MICROSCOPIC
Bacteria, UA: NONE SEEN
Bilirubin Urine: NEGATIVE
Glucose, UA: >=500 mg/dL — AB
Hgb urine dipstick: NEGATIVE
Ketones, ur: NEGATIVE mg/dL
Leukocytes,Ua: NEGATIVE
Nitrite: NEGATIVE
Protein, ur: 100 mg/dL — AB
Specific Gravity, Urine: 1.025 (ref 1.005–1.030)
Squamous Epithelial / HPF: NONE SEEN (ref 0–5)
pH: 6 (ref 5.0–8.0)

## 2019-11-25 LAB — BASIC METABOLIC PANEL
Anion gap: 13 (ref 5–15)
BUN: 21 mg/dL (ref 8–23)
CO2: 24 mmol/L (ref 22–32)
Calcium: 9.2 mg/dL (ref 8.9–10.3)
Chloride: 105 mmol/L (ref 98–111)
Creatinine, Ser: 1.69 mg/dL — ABNORMAL HIGH (ref 0.61–1.24)
GFR calc Af Amer: 43 mL/min — ABNORMAL LOW (ref >60)
GFR calc non Af Amer: 37 mL/min — ABNORMAL LOW (ref >60)
Glucose, Bld: 238 mg/dL — ABNORMAL HIGH (ref 70–99)
Potassium: 3.8 mmol/L (ref 3.5–5.1)
Sodium: 142 mmol/L (ref 135–145)

## 2019-11-25 LAB — BLOOD GAS, VENOUS
Acid-Base Excess: 1.7 mmol/L (ref 0.0–2.0)
Bicarbonate: 26.6 mmol/L (ref 20.0–28.0)
O2 Saturation: 89 %
Patient temperature: 37
pCO2, Ven: 42 mmHg — ABNORMAL LOW (ref 44.0–60.0)
pH, Ven: 7.41 (ref 7.250–7.430)
pO2, Ven: 56 mmHg — ABNORMAL HIGH (ref 32.0–45.0)

## 2019-11-25 LAB — URINE DRUG SCREEN, QUALITATIVE (ARMC ONLY)
Amphetamines, Ur Screen: NOT DETECTED
Barbiturates, Ur Screen: NOT DETECTED
Benzodiazepine, Ur Scrn: NOT DETECTED
Cannabinoid 50 Ng, Ur ~~LOC~~: NOT DETECTED
Cocaine Metabolite,Ur ~~LOC~~: NOT DETECTED
MDMA (Ecstasy)Ur Screen: NOT DETECTED
Methadone Scn, Ur: NOT DETECTED
Opiate, Ur Screen: NOT DETECTED
Phencyclidine (PCP) Ur S: NOT DETECTED
Tricyclic, Ur Screen: NOT DETECTED

## 2019-11-25 LAB — GLUCOSE, CAPILLARY
Glucose-Capillary: 492 mg/dL — ABNORMAL HIGH (ref 70–99)
Glucose-Capillary: 421 mg/dL — ABNORMAL HIGH (ref 70–99)
Glucose-Capillary: 287 mg/dL — ABNORMAL HIGH (ref 70–99)
Glucose-Capillary: 212 mg/dL — ABNORMAL HIGH (ref 70–99)

## 2019-11-25 LAB — COMPREHENSIVE METABOLIC PANEL
ALT: 77 U/L — ABNORMAL HIGH (ref 0–44)
AST: 62 U/L — ABNORMAL HIGH (ref 15–41)
Albumin: 3.6 g/dL (ref 3.5–5.0)
Alkaline Phosphatase: 119 U/L (ref 38–126)
Anion gap: 15 (ref 5–15)
BUN: 27 mg/dL — ABNORMAL HIGH (ref 8–23)
CO2: 19 mmol/L — ABNORMAL LOW (ref 22–32)
Calcium: 9.1 mg/dL (ref 8.9–10.3)
Chloride: 100 mmol/L (ref 98–111)
Creatinine, Ser: 2.02 mg/dL — ABNORMAL HIGH (ref 0.61–1.24)
GFR calc Af Amer: 35 mL/min — ABNORMAL LOW (ref >60)
GFR calc non Af Amer: 30 mL/min — ABNORMAL LOW (ref >60)
Glucose, Bld: 668 mg/dL (ref 70–99)
Potassium: 5.5 mmol/L — ABNORMAL HIGH (ref 3.5–5.1)
Sodium: 134 mmol/L — ABNORMAL LOW (ref 135–145)
Total Bilirubin: 0.7 mg/dL (ref 0.3–1.2)
Total Protein: 6.7 g/dL (ref 6.5–8.1)

## 2019-11-25 LAB — TROPONIN I (HIGH SENSITIVITY)
Troponin I (High Sensitivity): 22 ng/L — ABNORMAL HIGH (ref <18)
Troponin I (High Sensitivity): 27 ng/L — ABNORMAL HIGH (ref <18)
Troponin I (High Sensitivity): 60 ng/L — ABNORMAL HIGH (ref <18)

## 2019-11-25 LAB — CK: Total CK: 164 U/L (ref 49–397)

## 2019-11-25 LAB — TSH: TSH: 1.38 u[IU]/mL (ref 0.350–4.500)

## 2019-11-25 LAB — LACTIC ACID, PLASMA
Lactic Acid, Venous: 3.7 mmol/L (ref 0.5–1.9)
Lactic Acid, Venous: 3 mmol/L (ref 0.5–1.9)

## 2019-11-25 LAB — T4, FREE: Free T4: 1.05 ng/dL (ref 0.61–1.12)

## 2019-11-25 LAB — RESPIRATORY PANEL BY RT PCR (FLU A&B, COVID)
Influenza A by PCR: NEGATIVE
Influenza B by PCR: NEGATIVE
SARS Coronavirus 2 by RT PCR: NEGATIVE

## 2019-11-25 MED ORDER — HALOPERIDOL LACTATE 5 MG/ML IJ SOLN
2.0000 mg | Freq: Once | INTRAMUSCULAR | Status: DC
  Filled 2019-11-25: qty 1

## 2019-11-25 MED ORDER — DILTIAZEM LOAD VIA INFUSION
10.0000 mg | Freq: Once | INTRAVENOUS | Status: DC
  Filled 2019-11-25: qty 10

## 2019-11-25 MED ORDER — LORAZEPAM 2 MG/ML IJ SOLN
2.0000 mg | Freq: Once | INTRAMUSCULAR | Status: AC
  Administered 2019-11-25: 2 mg via INTRAVENOUS
  Filled 2019-11-25: qty 1

## 2019-11-25 MED ORDER — SODIUM CHLORIDE 0.9 % IV SOLN: INTRAVENOUS | Status: DC

## 2019-11-25 MED ORDER — HALOPERIDOL LACTATE 5 MG/ML IJ SOLN
2.5000 mg | Freq: Once | INTRAMUSCULAR | Status: AC
  Administered 2019-11-25: 2.5 mg via INTRAVENOUS

## 2019-11-25 MED ORDER — DILTIAZEM HCL-DEXTROSE 125-5 MG/125ML-% IV SOLN (PREMIX): 5.0000 mg/h | INTRAVENOUS | Status: DC

## 2019-11-25 MED ORDER — SODIUM CHLORIDE 0.9 % IV BOLUS
1000.0000 mL | Freq: Once | INTRAVENOUS | Status: AC
  Administered 2019-11-25: 1000 mL via INTRAVENOUS

## 2019-11-25 MED ORDER — INSULIN REGULAR(HUMAN) IN NACL 100-0.9 UT/100ML-% IV SOLN
INTRAVENOUS | Status: DC
  Administered 2019-11-25: 13 [IU]/h via INTRAVENOUS
  Filled 2019-11-25 (×2): qty 100

## 2019-11-25 MED ORDER — DEXTROSE-NACL 5-0.45 % IV SOLN: INTRAVENOUS | Status: DC

## 2019-11-25 MED ORDER — DEXTROSE 50 % IV SOLN: 0.0000 mL | INTRAVENOUS | Status: DC | PRN

## 2019-11-25 MED ORDER — HALOPERIDOL LACTATE 5 MG/ML IJ SOLN
2.5000 mg | Freq: Once | INTRAMUSCULAR | Status: AC
  Administered 2019-11-25: 2.5 mg via INTRAVENOUS
  Filled 2019-11-25: qty 1

## 2019-11-25 MED ORDER — IPRATROPIUM-ALBUTEROL 0.5-2.5 (3) MG/3ML IN SOLN: 3.0000 mL | Freq: Once | RESPIRATORY_TRACT | Status: DC

## 2019-11-25 NOTE — H&P (Addendum)
Brett [name] AST:[id number] DOB: [birth year] DOA: 11/25/2019     PCP: Perrin Maltese, MD   Outpatient Specialists:   CARDS:   Dr. [name]    Patient arrived to ER on 11/25/19 at 1417  Patient coming from: home Lives alone, With MPOA checking in on him every day    Chief Complaint:  Chief Complaint  Patient presents with  . Altered Mental Status    HPI: Brett [name] is a 84 y.o. male with medical history significant of CAD, CHF, diabetes, COPD with frequent admissions for pneumonia, Anemia, B-cell lymphoma status post chemotherapy, CAD, carotid stenosis, DM2, HTN, HLD, peptic ulcer disease, peripheral vascular disease fibrillation  may have had mini Stroke  Presented with   AMS and aphasia, found down in his house confused Last seen normal was 9 PM  He have trouble finishing his sentences He was rambling last night They talk every day on the phone He has been isolated His friend has been helping have been healthy Hx of Lymphoma in remission  He does not drink quit smoking 5 years ago No dementia But lately has been very confused   He cannot see out of right eye  Infectious risk factors:  Per friend Reports confusion    In  ER  COVID TEST  NEGATIVE   Lab Results  Component Value Date   [place] 11/25/2019   [place] NEGATIVE 07/14/2019   [place] NEGATIVE 05/22/2019    Regarding pertinent Chronic problems:    Hyperlipidemia -  on statins crestor   HTN on Norvasc, hydralazine, imdur, toprolol   chronic CHF diastolic  - last echo June 2020 showing preserved EF but mildly diminished diastolic function  On lasix    CAD  - On Aspirin, statin, betablocker, Plavix                 -  followed by cardiology                - last cardiac cath 2017 by Dr. [name]   DM 2 -  Lab Results  Component Value Date   HGBA1C 7.2 (H) 05/23/2019   PO meds only,    He has been on O2 for COPD as needed    OSA -on nocturnal oxygen,     Hx of  TIA-  with/out residual deficits on off Aspirin 81 mg , Plavix   a.fib on amiodarone not on anticoagulation   CKD stage III - baseline Cr   2.0 Lab Results  Component Value Date   CREATININE 2.02 (H) 11/25/2019   CREATININE 2.05 (H) 07/15/2019   CREATININE 1.88 (H) 07/14/2019      While in ER: Tachycardic up to 109, disoriented with dense expressive aphasia nonsensical speech rightward gaze preference right arm weakness does not follow commands Lactic acid 3.7 troponin II  CT head nonacute Chest x-ray unremarkable  Not a candidate for thrombolytics or code stroke Haldol given secondary to agitation  ER Provider Called: Neurology    Dr.Zeylikman  They Recommend admit to medicine   Order MRI   The following Work up has been ordered so far:  Orders Placed This Encounter  Procedures  . Critical Care  . Critical Care  . Respiratory Panel by RT PCR (Flu A&B, Covid) - Nasopharyngeal Swab  . CT Head Wo Contrast  . CT Cervical Spine Wo Contrast  . DG Chest Portable 1 View  . CBC with Differential  . Comprehensive metabolic panel  .  Lactic acid, plasma  . CK  . Blood gas, venous  . Beta-hydroxybutyric acid  . Urinalysis, Complete w Microscopic  . Osmolality  . Glucose, capillary  . In and Out Cath  . Notify physician  . If present, discontinue Insulin Pump after IV Insulin is initiated.  . Do NOT use lab glucose values in [employer].  If CBG meter reads "Critical High", enter 600.  . IV bolus already initiated  . K+ > 5 mEq/L and/or K+ addressed separately  . Consult to hospitalist  ALL PATIENTS BEING ADMITTED/HAVING PROCEDURES NEED COVID-19 SCREENING  . CBG monitoring, ED  . CBG monitoring, ED  . EKG 12-Lead  . ED EKG  . Insert peripheral IV     Following Medications were ordered in ER: Medications  ipratropium-albuterol (DUONEB) 0.5-2.5 (3) MG/3ML nebulizer solution 3 mL (3 mLs Nebulization Refused 11/25/19 1720)  sodium chloride 0.9 % bolus 1,000 mL (has no  administration in time range)  insulin regular, human (MYXREDLIN) 100 units/ 100 mL infusion (13 Units/hr Intravenous New Bag/Given 11/25/19 1802)  dextrose 5 %-0.45 % sodium chloride infusion (has no administration in time range)  dextrose 50 % solution 0-50 mL (has no administration in time range)  0.9 %  sodium chloride infusion (has no administration in time range)  haloperidol lactate (HALDOL) injection 2.5 mg (2.5 mg Intravenous Given 11/25/19 1505)  sodium chloride 0.9 % bolus 1,000 mL (1,000 mLs Intravenous New Bag/Given 11/25/19 1705)  haloperidol lactate (HALDOL) injection 2.5 mg (2.5 mg Intravenous Given 11/25/19 1551)        Consult Orders  (From admission, onward)         Start     Ordered   11/25/19 1757  Consult to hospitalist  ALL PATIENTS BEING ADMITTED/HAVING PROCEDURES NEED COVID-19 SCREENING  Once    Comments: ALL PATIENTS BEING ADMITTED/HAVING PROCEDURES NEED COVID-19 SCREENING  Provider:  (Not yet assigned)  Question Answer Comment  Place call to: hospitalist   Reason for Consult Admit   Diagnosis/Clinical Info for Consult: AMS, concern for HSS/HONC      11/25/19 1756            Significant initial  Findings: Abnormal Labs Reviewed  CBC WITH DIFFERENTIAL/PLATELET - Abnormal; Notable for the following components:      Result Value   RBC 3.85 (*)    Hemoglobin 10.7 (*)    HCT 32.1 (*)    All other components within normal limits  COMPREHENSIVE METABOLIC PANEL - Abnormal; Notable for the following components:   Sodium 134 (*)    Potassium 5.5 (*)    CO2 19 (*)    Glucose, Bld 668 (*)    BUN 27 (*)    Creatinine, Ser 2.02 (*)    AST 62 (*)    ALT 77 (*)    GFR calc non Af Amer 30 (*)    GFR calc Af Amer 35 (*)    All other components within normal limits  LACTIC ACID, PLASMA - Abnormal; Notable for the following components:   Lactic Acid, Venous 3.7 (*)    All other components within normal limits  BLOOD GAS, VENOUS - Abnormal; Notable for  the following components:   pCO2, Ven 42 (*)    pO2, Ven 56.0 (*)    All other components within normal limits  BETA-HYDROXYBUTYRIC ACID - Abnormal; Notable for the following components:   Beta-Hydroxybutyric Acid 0.70 (*)    All other components within normal limits  GLUCOSE, CAPILLARY - Abnormal; Notable  for the following components:   Glucose-Capillary 492 (*)    All other components within normal limits  TROPONIN I (HIGH SENSITIVITY) - Abnormal; Notable for the following components:   Troponin I (High Sensitivity) 22 (*)    All other components within normal limits  TROPONIN I (HIGH SENSITIVITY) - Abnormal; Notable for the following components:   Troponin I (High Sensitivity) 27 (*)    All other components within normal limits    Otherwise labs showing:    Recent Labs  Lab 11/25/19 1450  NA 134*  K 5.5*  CO2 19*  GLUCOSE 668*  BUN 27*  CREATININE 2.02*  CALCIUM 9.1    Cr    stable,    Lab Results  Component Value Date   CREATININE 2.02 (H) 11/25/2019   CREATININE 2.05 (H) 07/15/2019   CREATININE 1.88 (H) 07/14/2019    Recent Labs  Lab 11/25/19 1450  AST 62*  ALT 77*  ALKPHOS 119  BILITOT 0.7  PROT 6.7  ALBUMIN 3.6   Lab Results  Component Value Date   CALCIUM 9.1 11/25/2019   PHOS 3.7 02/09/2019     WBC      Component Value Date/Time   WBC 6.2 11/25/2019 1450   ANC    Component Value Date/Time   NEUTROABS 4.6 11/25/2019 1450   NEUTROABS 4.8 05/07/2010 1451   ALC No components found for: LYMPHAB    Plt: Lab Results  Component Value Date   PLT 163 11/25/2019     Lactic Acid, Venous    Component Value Date/Time   LATICACIDVEN 3.7 (HH) 11/25/2019 1450    Procalcitonin <0.1   COVID-19 Labs  No results for input(s): DDIMER, FERRITIN, LDH, CRP in the last 72 hours.  Lab Results  Component Value Date   SARSCOV2NAA NEGATIVE 11/25/2019   [place] NEGATIVE 07/14/2019   [place] NEGATIVE 05/22/2019      Venous  Blood Gas  result:  PH 7.41  PCO2 42 ;      HG/HCT  stable,      Component Value Date/Time   HGB 10.7 (L) 11/25/2019 1450   HGB 12.5 (L) 05/07/2010 1451   HCT 32.1 (L) 11/25/2019 1450   HCT 36.5 (L) 05/07/2010 1451    No results for input(s): LIPASE, AMYLASE in the last 168 hours. No results for input(s): AMMONIA in the last 168 hours.  No components found for: LABALBU   Troponin 22-27  Cardiac Panel (last 3 results) Recent Labs    11/25/19 1450  CKTOTAL 164     ECG: Ordered Personally reviewed by me showing: HR : 109  Rhythm: appears to be sinus tach no evidence of ischemic changes QTC 472   BNP (last 3 results) Recent Labs    02/09/19 0425 05/22/19 1118 07/14/19 0756  BNP 126.0* 206.0* 190.0*    ProBNP (last 3 results) No results for input(s): PROBNP in the last 8760 hours.  DM  labs:  HbA1C: Recent Labs    05/23/19 0054  HGBA1C 7.2*     CBG (last 3)  Recent Labs    11/25/19 1758  GLUCAP 492*     UA   no evidence of UTI     Urine analysis:    Component Value Date/Time   COLORURINE STRAW (A) 11/25/2019 1437   APPEARANCEUR CLEAR (A) 11/25/2019 1437   LABSPEC 1.025 11/25/2019 1437   PHURINE 6.0 11/25/2019 1437   GLUCOSEU >=500 (A) 11/25/2019 1437   HGBUR NEGATIVE 11/25/2019 [place] 11/25/2019  [place] 11/25/2019 1437   PROTEINUR 100 (A) 11/25/2019 1437   NITRITE NEGATIVE 11/25/2019 [place] 11/25/2019 1437      Ordered  CT HEAD NON acute CT neck - non acute KUB non acute CXR - NON acute       ED Triage Vitals  Enc Vitals Group     BP 11/25/19 1442 (!) 152/89     Pulse Rate 11/25/19 1429 (!) 109     Resp 11/25/19 1429 (!) 29     Temp 11/25/19 1429 98 F (36.7 C)     Temp Source 11/25/19 1429 Oral     SpO2 11/25/19 1429 96 %     Weight 11/25/19 1430 200 lb 9.9 oz (91 kg)     Height 11/25/19 1748 5\' 10"  (1.778 m)     Head Circumference --      Peak Flow --      Pain Score 11/25/19  1430 0     Pain Loc --      Pain Edu? --      Excl. in [place]? --   TMAX(24)@       Latest  Blood pressure (!) 152/89, pulse (!) 112, temperature 98 F (36.7 C), temperature source Oral, resp. rate (!) 23, height 5\' 10"  (1.778 m), weight 91 kg, SpO2 99 %.    Hospitalist was called for admission for CVA vs Hyperosmolar state   Review of Systems:    Pertinent positives include: confusion fatigue,  Constitutional:  No weight loss, night sweats, Fevers, chills,  weight loss  HEENT:  No headaches, Difficulty swallowing,Tooth/dental problems,Sore throat,  No sneezing, itching, ear ache, nasal congestion, post nasal drip,  Cardio-vascular:  No chest pain, Orthopnea, PND, anasarca, dizziness, palpitations.no Bilateral lower extremity swelling  GI:  No heartburn, indigestion, abdominal pain, nausea, vomiting, diarrhea, change in bowel habits, loss of appetite, melena, blood in stool, hematemesis Resp:  no shortness of breath at rest. No dyspnea on exertion, No excess mucus, no productive cough, No non-productive cough, No coughing up of blood.No change in color of mucus.No wheezing. Skin:  no rash or lesions. No jaundice GU:  no dysuria, change in color of urine, no urgency or frequency. No straining to urinate.  No flank pain.  Musculoskeletal:  No joint pain or no joint swelling. No decreased range of motion. No back pain.  Psych:  No change in mood or affect. No depression or anxiety. No memory loss.  Neuro: no localizing neurological complaints, no tingling, no weakness, no double vision, no gait abnormality, no slurred speech, no confusion  All systems reviewed and apart from [place] all are negative  Past Medical History:   Past Medical History:  Diagnosis Date  . Anemia   . Aortic regurgitation   . B-cell lymphoma ([place]) 2009   DX AT [facility]  . Bronchitis   . CAD (coronary artery disease)   . Carotid stenosis   . CHF (congestive heart failure) ([place])   . Diabetes mellitus  without complication ([place])   . Diabetes mellitus, type 2 ([place])   . Emphysema of lung ([place])   . Essential hypertension   . History of chemotherapy   . Hyperlipidemia   . Hypertension   . IDA (iron deficiency anemia)   . Leg edema   . Meralgia paraesthetica   . Mitral regurgitation   . PUD (peptic ulcer disease)   . PVD (peripheral vascular disease) ([place])   . Tobacco abuse  Past Surgical History:  Procedure Laterality Date  . CARDIAC CATHETERIZATION  08/15/2007  . CARDIAC CATHETERIZATION Right 07/13/2016   Procedure: Right/Left Heart Cath and Coronary Angiography;  Surgeon: Dionisio David, MD;  Location: [place] [facility];  Service: Cardiovascular;  Laterality: Right;  . COLONOSCOPY  03/2013  . ESOPHAGOGASTRODUODENOSCOPY  03/2013    Social History:  Ambulatory  Independently or  cane,       reports that he quit smoking about 4 years ago. His smoking use included cigarettes. He has never used smokeless tobacco. He reports that he does not drink alcohol or use drugs.   Family History:   Family History  Problem Relation Age of Onset  . CAD Mother   . Rheum arthritis Neg Hx   . Osteoarthritis Neg Hx   . Asthma Neg Hx   . Diabetes Neg Hx   . Cancer Neg Hx     Allergies: No Known Allergies   Prior to Admission medications   Medication Sig Start Date End Date Taking? Authorizing Provider  albuterol (PROVENTIL HFA;VENTOLIN HFA) 108 (90 Base) MCG/ACT inhaler Inhale 2 puffs into the lungs every 6 (six) hours as needed for wheezing or shortness of breath. 09/24/17   Loletha Grayer, MD  amiodarone (PACERONE) 200 MG tablet Take 1 tablet (200 mg total) by mouth daily. 07/16/19   Loletha Grayer, MD  amLODipine (NORVASC) 5 MG tablet Take 5 mg by mouth daily.  08/27/15   [provider]  ammonium lactate (LAC-HYDRIN) 12 % lotion Apply topically 2 (two) times daily. 07/16/19   Loletha Grayer, MD  cefdinir (OMNICEF) 300 MG capsule Take 1 capsule (300 mg total) by  mouth every 12 (twelve) hours. 07/16/19   Loletha Grayer, MD  clopidogrel (PLAVIX) 75 MG tablet Take 75 mg by mouth daily.  08/27/15   [provider]  cyanocobalamin (,VITAMIN B-12,) 1000 MCG/ML injection Inject 1,000 mcg into the muscle every 30 (thirty) days.    [provider]  ferrous sulfate 325 (65 FE) MG tablet Take 1 tablet (325 mg total) by mouth 2 (two) times daily with a meal. 05/25/19   Fritzi Mandes, MD  furosemide (LASIX) 20 MG tablet Take 20 mg by mouth daily. 05/30/19   [provider]  gabapentin (NEURONTIN) 100 MG capsule Take 1 capsule by mouth 2 (two) times daily.  09/22/15   [provider]  glimepiride (AMARYL) 2 MG tablet Take 1 tablet (2 mg total) by mouth daily with breakfast. 07/16/19 08/15/19  Loletha Grayer, MD  hydrALAZINE (APRESOLINE) 50 MG tablet Take 1 tablet (50 mg total) by mouth 2 (two) times daily. 07/16/19   Loletha Grayer, MD  ipratropium-albuterol (DUONEB) 0.5-2.5 (3) MG/3ML SOLN J44.1  51ml nebulizer every six hours as needed for shortness of breath 07/16/19   Loletha Grayer, MD  isosorbide mononitrate (IMDUR) 30 MG 24 hr tablet Take 30 mg by mouth daily.    [provider]  metoprolol (TOPROL-XL) 200 MG 24 hr tablet Take 1 tablet (200 mg total) by mouth daily. 09/24/17   Loletha Grayer, MD  mometasone-formoterol (DULERA) 100-5 MCG/ACT AERO Inhale 2 puffs into the lungs 2 (two) times daily. 07/16/19   Loletha Grayer, MD  pantoprazole (PROTONIX) 40 MG tablet Take 40 mg by mouth daily.    [provider]  rosuvastatin (CRESTOR) 40 MG tablet Take 40 mg by mouth daily. 04/13/19   [provider]  terbinafine (LAMISIL) 1 % cream Apply twice a day to feet (okay to substitute any anti  fungal cream that is the cheapest) 07/16/19   Loletha Grayer, MD  tiotropium (SPIRIVA HANDIHALER) 18 MCG inhalation capsule Place 1 capsule (18 mcg total) into inhaler and inhale daily. 05/31/18   Henreitta Leber, MD  Vitamin  D, Ergocalciferol, (DRISDOL) 1.25 MG (50000 UT) CAPS capsule Take 50,000 Units by mouth once a week.    [provider]   Physical Exam: Blood pressure (!) 152/89, pulse (!) 112, temperature 98 F (36.7 C), temperature source Oral, resp. rate (!) 23, height 5\' 10"  (1.778 m), weight 91 kg, SpO2 99 %. 1. General:  in No  Acute distress   Chronically ill -appearing 2. Psychological: Alert and   Oriented 3. Head/ENT:    Dry Mucous Membranes                          Head Non traumatic, neck supple                           Poor Dentition 4. SKIN:   decreased Skin turgor,  Skin clean Dry and intact no rash 5. Heart: Regular rate and rhythm no    Murmur, no Rub or gallop 6. Lungs:   no wheezes or crackles   7. Abdomen: Soft, non-tender, Non distended   Obese bowel sounds present 8. Lower extremities: no clubbing, cyanosis, no edema 9. Neurologically unable to cooperate with exam at this time Squeezed right hand eyes do not appear to be deviated to one side 10. MSK: Normal range of motion   All other LABS:     Recent Labs  Lab 11/25/19 1450  WBC 6.2  NEUTROABS 4.6  HGB 10.7*  HCT 32.1*  MCV 83.4  PLT 163     Recent Labs  Lab 11/25/19 1450  NA 134*  K 5.5*  CL 100  CO2 19*  GLUCOSE 668*  BUN 27*  CREATININE 2.02*  CALCIUM 9.1     Recent Labs  Lab 11/25/19 1450  AST 62*  ALT 77*  ALKPHOS 119  BILITOT 0.7  PROT 6.7  ALBUMIN 3.6       Cultures:    Component Value Date/Time   SDES  05/23/2019 0855    URINE, RANDOM Performed at [facility], [address] [place], [place] [address]    [facility]  05/23/2019 [phone]    Normal Performed at [facility], [address] [place], [place] [address]    CULT  05/23/2019 0855    NO GROWTH Performed at [facility], [place] [address] [place], [place] [address]    REPTSTATUS 05/24/2019 FINAL 05/23/2019 0855     Radiological Exams on Admission: CT ABDOMEN PELVIS WO  CONTRAST  Result Date: 11/26/2019 CLINICAL DATA:  Abdominal distension with elevated lactate. EXAM: CT ABDOMEN AND PELVIS WITHOUT CONTRAST TECHNIQUE: Multidetector CT imaging of the abdomen and pelvis was performed following the standard protocol without IV contrast. COMPARISON:  None. FINDINGS: LOWER CHEST: No basilar pleural or apical pericardial effusion. HEPATOBILIARY: Diffuse hypoattenuation of the liver relative to the spleen suggests hepatic steatosis. No focal liver lesion or biliary dilatation. There is focal fatty sparing along the gallbladder fossa and along the anterior hepatic edge. Normal gallbladder. PANCREAS: Normal pancreas. No ductal dilatation or peripancreatic fluid collection. SPLEEN: Normal. ADRENALS/URINARY TRACT: --Adrenal glands: Normal. --Kidneys and ureters: No hydronephrosis, nephroureterolithiasis or solid renal mass. Mild bilateral perinephric stranding, likely chronic. --Urinary bladder: Normal for degree of  distention STOMACH/BOWEL: --Stomach/Duodenum: No hiatal hernia. Normal duodenal course and caliber. --Small bowel: No dilatation or inflammation. --Colon: No focal abnormality. --Appendix: Normal. VASCULAR/LYMPHATIC: There is calcific atherosclerosis of the abdominal aorta. No abdominal or pelvic lymphadenopathy. REPRODUCTIVE: Normal prostate size with symmetric seminal vesicles. MUSCULOSKELETAL. No bony spinal canal stenosis or focal osseous abnormality. OTHER: None. IMPRESSION: 1. No acute abdominal or pelvic abnormality. 2. Hepatic steatosis. 3. Aortic Atherosclerosis (ICD10-I70.0). Electronically Signed   By: Ulyses Jarred M.D.   On: 11/26/2019 00:26   DG Abd 1 View  Result Date: 11/25/2019 CLINICAL DATA:  Abdominal distension EXAM: ABDOMEN - 1 VIEW COMPARISON:  CT abdomen pelvis 05/13/2010 FINDINGS: No high-grade obstructive bowel gas pattern. No suspicious calcifications over the gallbladder fossa. Coarse calcification is seen in the region of the left renal pelvis.  No calcifications seen along the course of either ureter. Vascular calcium noted in the pelvis. Degenerative changes in the spine and hips. Lung bases are clear. Included mediastinal contours are unremarkable. IMPRESSION: 1. Calcification of the left renal pelvis could reflect, possible urolithiasis versus vascular calcium. No calcifications along the course of the ureters. 2. No high-grade obstructive bowel gas pattern. Electronically Signed   By: Lovena Le M.D.   On: 11/25/2019 22:05   CT Head Wo Contrast  Result Date: 11/25/2019 CLINICAL DATA:  Patient was found down. EXAM: CT HEAD WITHOUT CONTRAST CT CERVICAL SPINE WITHOUT CONTRAST TECHNIQUE: Multidetector CT imaging of the head and cervical spine was performed following the standard protocol without intravenous contrast. Multiplanar CT image reconstructions of the cervical spine were also generated. COMPARISON:  CT scan dated 01/21/2019 FINDINGS: CT HEAD FINDINGS Brain: No evidence of acute infarction, hemorrhage, hydrocephalus, extra-axial collection or mass lesion/mass effect. There appears to be an old small left cerebellar hemisphere infarct, unchanged. Vascular: No hyperdense vessel or unexpected calcification. Skull: Normal. Negative for fracture or focal lesion. Sinuses/Orbits: No acute finding. Other: None CT CERVICAL SPINE FINDINGS Alignment: Straightening of the cervical lordosis. Skull base and vertebrae: No acute fracture. No primary bone lesion. Ankylosis of the C3 and C4 vertebra and left lateral masses. Soft tissues and spinal canal: Hypertrophy of the transverse ligament at C1-2. The spinal cord is not compressed at that level. No prevertebral soft tissue swelling visible canal hematoma. Disc levels: C2-3: No significant disc bulging. Moderate left facet arthritis. No foraminal stenosis. C3-4: No disc bulging or protrusion. The vertebra and left lateral masses are fused. No foraminal stenosis. C4-5: Disc space narrowing. Osteophytes  protrude into both neural foramina without significant foraminal stenosis. C5-6: Disc space narrowing with prominent degenerative changes of the vertebral endplates. Broad-based disc osteophyte complex slightly narrows the AP dimension of the spinal canal. Uncinate spurs extend into both neural foramina with moderate bilateral foraminal stenosis. C6-7: Chronic disc space narrowing. Broad-based disc osteophyte complex with moderate left foraminal stenosis. C7-T1: Moderate right facet arthritis. Otherwise negative. Upper chest: Emphysematous changes at the right lung apex. Other: None IMPRESSION: 1. No acute intracranial abnormality. 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 2. No acute abnormality of the cervical spine. Multilevel degenerative disc and joint disease. Electronically Signed   By: Lorriane Shire M.D.   On: 11/25/2019 15:28   CT Cervical Spine Wo Contrast  Result Date: 11/25/2019 CLINICAL DATA:  Patient was found down. EXAM: CT HEAD WITHOUT CONTRAST CT CERVICAL SPINE WITHOUT CONTRAST TECHNIQUE: Multidetector CT imaging of the head and cervical spine was performed following the standard protocol without intravenous contrast. Multiplanar CT image reconstructions of the cervical spine were  also generated. COMPARISON:  CT scan dated 01/21/2019 FINDINGS: CT HEAD FINDINGS Brain: No evidence of acute infarction, hemorrhage, hydrocephalus, extra-axial collection or mass lesion/mass effect. There appears to be an old small left cerebellar hemisphere infarct, unchanged. Vascular: No hyperdense vessel or unexpected calcification. Skull: Normal. Negative for fracture or focal lesion. Sinuses/Orbits: No acute finding. Other: None CT CERVICAL SPINE FINDINGS Alignment: Straightening of the cervical lordosis. Skull base and vertebrae: No acute fracture. No primary bone lesion. Ankylosis of the C3 and C4 vertebra and left lateral masses. Soft tissues and spinal canal: Hypertrophy of the transverse ligament at  C1-2. The spinal cord is not compressed at that level. No prevertebral soft tissue swelling visible canal hematoma. Disc levels: C2-3: No significant disc bulging. Moderate left facet arthritis. No foraminal stenosis. C3-4: No disc bulging or protrusion. The vertebra and left lateral masses are fused. No foraminal stenosis. C4-5: Disc space narrowing. Osteophytes protrude into both neural foramina without significant foraminal stenosis. C5-6: Disc space narrowing with prominent degenerative changes of the vertebral endplates. Broad-based disc osteophyte complex slightly narrows the AP dimension of the spinal canal. Uncinate spurs extend into both neural foramina with moderate bilateral foraminal stenosis. C6-7: Chronic disc space narrowing. Broad-based disc osteophyte complex with moderate left foraminal stenosis. C7-T1: Moderate right facet arthritis. Otherwise negative. Upper chest: Emphysematous changes at the right lung apex. Other: None IMPRESSION: 1. No acute intracranial abnormality. 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 2. No acute abnormality of the cervical spine. Multilevel degenerative disc and joint disease. Electronically Signed   By: Lorriane Shire M.D.   On: 11/25/2019 15:28   MR Brain Wo Contrast (neuro protocol)  Result Date: 11/25/2019 CLINICAL DATA:  Encephalopathy and aphasia. Found down. EXAM: MRI HEAD WITHOUT CONTRAST TECHNIQUE: Multiplanar, multiecho pulse sequences of the brain and surrounding structures were obtained without intravenous contrast. COMPARISON:  Brain MRI 12/02/2016 FINDINGS: BRAIN: There is no acute infarct, acute hemorrhage or extra-axial collection. Mild white matter hyperintensity, most commonly due to chronic ischemic microangiopathy, though not unexpected for age. There is generalized atrophy without lobar predilection. The midline structures are normal. VASCULAR: The major intracranial arterial and venous sinus flow voids are normal. Small amount of  hemosiderin deposition in the right central sulcus. SKULL AND UPPER CERVICAL SPINE: Calvarial bone marrow signal is normal. There is no skull base mass. The visualized upper cervical spine and soft tissues are normal. SINUSES/ORBITS: There are no fluid levels or advanced mucosal thickening. The mastoid air cells and middle ear cavities are free of fluid. The orbits are normal. IMPRESSION: 1. No acute intracranial abnormality. 2. Generalized atrophy and mild chronic small vessel disease. Electronically Signed   By: Ulyses Jarred M.D.   On: 11/25/2019 23:37   DG Chest Portable 1 View  Result Date: 11/25/2019 CLINICAL DATA:  Altered mental status, history lymphoma, coronary artery disease, diabetes mellitus, CHF, hypertension, former smoker EXAM: PORTABLE CHEST 1 VIEW COMPARISON:  Portable exam 1509 hours compared 07/14/2019 FINDINGS: Upper normal heart size. Mediastinal contours and pulmonary vascularity normal. Atherosclerotic calcification aorta. Lungs clear. No infiltrate, pleural effusion or pneumothorax. No acute osseous findings. IMPRESSION: No acute abnormalities. Aortic Atherosclerosis (ICD10-I70.0). Electronically Signed   By: Lavonia Dana M.D.   On: 11/25/2019 15:26    Chart has been reviewed    Assessment/Plan   84 y.o. male with medical history significant of CAD, CHF, diabetes, COPD with frequent admissions for pneumonia, Anemia, B-cell lymphoma status post chemotherapy, CAD, carotid stenosis, DM2, HTN, HLD, peptic ulcer disease, peripheral  vascular disease fibrillation  may have had mini Stroke  Admitted for TIA acute encephalopathy in the setting of hyperglycemia  Present on Admission: . Acute encephalopathy vs, TIA/CVA  - will admit based on TIA/CVA protocol,          Monitor on Tele        MRI await  results   Carotid Doppler ordered       Echo to evaluate for possible embolic source,        obtain cardiac enzymes,  ECG,   Lipid panel, TSH.        Order PT/OT evaluation.         Keep nothing by mouth until passes swallow eval  will need Speech pathology evaluation       If evidence of  CVA Will make sure patient is on antiplatelet   and statin  Hold off on Plavix for tonight as pt unable to tolerate PO       Allow permissive Hypertension keep BP <220/120        Neurology consulted will see in AM Would will try to hold any sedating medications if able Given significant mental status changes will obtain EEG Check ammonia level Unclear if localized neurological deficits patient currently unable to cooperate with exam  MRI - no CVA   . B-cell lymphoma ([place]) -chronic currently in remission  . COPD (chronic obstructive pulmonary disease) (HCC) chronic continue home medications VBG obtained no evidence of CO2 retention to explain mental status changes  . HTN (hypertension) -allow permissive hypertension for tonight  . CAD (coronary artery disease) -chronic stable when able to tolerate p.o. resume home medications  . Chronic diastolic heart failure (HCC) -chronic appears to be somewhat on the dry side we will gently rehydrate    . Type 2 diabetes mellitus with hyperosmolar nonketotic hyperglycemia (HCC)  Hyperglycemia -initiate glucose stabilizer and admit to stepdown while on glucose drip.   -  check TSH and HgA1C  - Hold by mouth medications    . AF (paroxysmal atrial fibrillation) ([place]) -patient apparently not anticoagulation he has amiodarone with his list of medication his MPOA was unaware the patient has atrial fibrillation.  Patient apparently currently in atrial fibrillation Patient already on Plavix and will obtain MRI for evaluation of possible CVA.  Pending results may determine if would be a good candidate for anticoagulation [place] vas score above 3 If develops a.fib w rvr  will initiate diltiazem drip ECG In ER initially more consistent with sinus tach  Elevated lactic acid unclear etiology will rehydrate and continue to follow Afebrile no evidence  of infectious process  Abdominal distention -patient unable to provide history abdomen appears to be tense KUB unremarkable will obtain CT given elevated creatinine noncontrasted to further evaluate given elevated lactic acid CT abd non acute  elevated Osmolar gap - elevated after hyperglycemia has been corrected obtain etoh, ethylene glycol level and repeat repeat and reasses Other plan as per orders.  DVT prophylaxis:  SCD    Code Status:   DNR/DNI    as per MPOA I had personally discussed CODE STATUS with MPOA  over all poor guarded prognosis Admit to stepdown   Family Communication:   Family not at  Bedside  plan of care was discussed on the phone with with [facility]AP  Disposition Plan:    likely will need placement for rehabilitation  Would benefit from PT/OT eval prior to DC  Ordered                   Swallow eval - SLP ordered                   Social Work  consulted                   Nutrition    consulted                                     Consults called:  Neurology  Admission status:  ED Disposition    ED Disposition Condition Comment   Admit  The patient appears reasonably stabilized for admission considering the current resources, flow, and capabilities available in the ED at this time, and I doubt any other [facility] requiring further screening and/or treatment in the ED prior to admission is  present.      obs   Level of care     SDU tele indefinitely please discontinue once patient no longer qualifies   Precautions: admitted as  Covid Negative  No active isolations   PPE: Used by the provider:   P100  eye Goggles,  Gloves     Altonio Schwertner 11/25/2019, 9:01 PM    [facility]     after 2 AM please page floor coverage PA If 7AM-7PM, please contact the day team taking care of the patient using [email or web address]

## 2019-11-25 NOTE — ED Notes (Signed)
Report refused d/t to pt on insulin drip and MEWS score, Maia Breslow, RN to notify [employer], charge [employer] notified

## 2019-11-25 NOTE — ED Provider Notes (Addendum)
Labs reveal glucose 668, creatinine 2.02. Normal gap. Lactic acid 3.7. Normal CK.  CT imaging w/o acute abnormalities.   He does look significantly dehydrated on exam.  Concern for possible HHS/HONC as etiology of AMS vs underlying factor causing recrudescence of prior stroke symptoms. Will initiate fluids, obtain serum ketones, serum osmolality, UA, VBG.   VBG reveals normal pH.  COVID testing negative.  Beta hydroxybutyrate elevated at 0.7.  Plan for aggressive fluid resuscitation, insulin drip, and admission.  .Critical Care Performed by: Lilia Pro., MD Authorized by: Lilia Pro., MD   Critical care provider statement:    Critical care time (minutes):  30   Critical care was time spent personally by me on the following activities:  Discussions with consultants, evaluation of patient's response to treatment, examination of patient, ordering and performing treatments and interventions, ordering and review of laboratory studies, ordering and review of radiographic studies, pulse oximetry, re-evaluation of patient's condition, obtaining history from patient or surrogate and review of old charts   I assumed direction of critical care for this patient from another provider in my specialty: yes          Lilia Pro., MD 11/25/19 2004

## 2019-11-25 NOTE — ED Provider Notes (Signed)
[facility] Emergency Department Provider Note  ____________________________________________   First MD Initiated Contact with Patient 11/25/19 1438     (approximate)  I have reviewed the triage vital signs and the nursing notes.   HISTORY  Chief Complaint Altered Mental Status    HPI Brett [name] is a 84 y.o. male with past medical history as below including CAD, CHF, diabetes, COPD with frequent admissions for pneumonia, here with altered mental status.  History is limited secondary to aphasia.  Per report, the patient was found down in his house, confused, making nonsensical speech by a neighbor, who heard him banging on the wall.  I called and discussed with his friend, who is his point of contact, and states the last time that he was seen normal was before 9 PM yesterday.  He does not know when, but states that he called 9 the patient did seem somewhat confused with mild slurring, though he was otherwise making sense.  He does not recall if the patient has been sick and states that patient did not voice any complaints about feeling unwell.  Remainder of history is limited secondary to confusion and aphasia.  Level 5 caveat invoked as remainder of history, ROS, and physical exam limited due to patient's aphasia, confusion   Past Medical History:  Diagnosis Date  . Anemia   . Aortic regurgitation   . B-cell lymphoma ([place]) 2009   DX AT [facility]  . Bronchitis   . CAD (coronary artery disease)   . Carotid stenosis   . CHF (congestive heart failure) ([place])   . Diabetes mellitus without complication ([place])   . Diabetes mellitus, type 2 ([place])   . Emphysema of lung ([place])   . Essential hypertension   . History of chemotherapy   . Hyperlipidemia   . Hypertension   . IDA (iron deficiency anemia)   . Leg edema   . Meralgia paraesthetica   . Mitral regurgitation   . PUD (peptic ulcer disease)   . PVD (peripheral vascular disease) ([place])   . Tobacco abuse       Patient Active Problem List   Diagnosis Date Noted  . Atrial fibrillation with RVR ([place]) 05/22/2019  . Acute on chronic respiratory failure with hypoxemia ([place]) 02/09/2019  . Pneumonia 01/21/2019  . Type 2 diabetes mellitus with hyperosmolar nonketotic hyperglycemia ([place]) 08/24/2018  . CHF exacerbation ([place]) 05/29/2018  . COPD exacerbation ([place]) 12/05/2017  . CAP (community acquired pneumonia) 11/13/2017  . Acute respiratory failure ([place]) 11/13/2017  . SIRS (systemic inflammatory response syndrome) ([place]) 11/13/2017  . Stroke ([place]) 12/02/2016  . Stroke (cerebrum) ([place]) 12/02/2016  . Leg weakness, bilateral 09/03/2016  . Chronic diastolic heart failure ([place]) 08/06/2016  . Pleural effusion 08/05/2016  . Sepsis ([place]) 07/10/2016  . HCAP (healthcare-associated pneumonia) 07/10/2016  . HTN (hypertension) 07/10/2016  . Diabetes ([place]) 07/10/2016  . CAD (coronary artery disease) 07/10/2016  . COPD (chronic obstructive pulmonary disease) ([place]) 06/20/2016  . Chest pain 06/18/2016  . B-cell lymphoma ([place]) 10/14/2015    Past Surgical History:  Procedure Laterality Date  . CARDIAC CATHETERIZATION  08/15/2007  . CARDIAC CATHETERIZATION Right 07/13/2016   Procedure: Right/Left Heart Cath and Coronary Angiography;  Surgeon: Dionisio David, MD;  Location: [facility];  Service: Cardiovascular;  Laterality: Right;  . COLONOSCOPY  03/2013  . ESOPHAGOGASTRODUODENOSCOPY  03/2013    Prior to Admission medications   Medication Sig Start Date End Date Taking? Authorizing Provider  albuterol (PROVENTIL HFA;VENTOLIN  HFA) 108 (90 Base) MCG/ACT inhaler Inhale 2 puffs into the lungs every 6 (six) hours as needed for wheezing or shortness of breath. 09/24/17   Loletha Grayer, MD  amiodarone (PACERONE) 200 MG tablet Take 1 tablet (200 mg total) by mouth daily. 07/16/19   Loletha Grayer, MD  amLODipine (NORVASC) 5 MG tablet Take 5 mg by mouth daily.  08/27/15   [provider]  ammonium  lactate (LAC-HYDRIN) 12 % lotion Apply topically 2 (two) times daily. 07/16/19   Loletha Grayer, MD  cefdinir (OMNICEF) 300 MG capsule Take 1 capsule (300 mg total) by mouth every 12 (twelve) hours. 07/16/19   Loletha Grayer, MD  clopidogrel (PLAVIX) 75 MG tablet Take 75 mg by mouth daily.  08/27/15   [provider]  cyanocobalamin (,VITAMIN B-12,) 1000 MCG/ML injection Inject 1,000 mcg into the muscle every 30 (thirty) days.    [provider]  ferrous sulfate 325 (65 FE) MG tablet Take 1 tablet (325 mg total) by mouth 2 (two) times daily with a meal. 05/25/19   Fritzi Mandes, MD  furosemide (LASIX) 20 MG tablet Take 20 mg by mouth daily. 05/30/19   [provider]  gabapentin (NEURONTIN) 100 MG capsule Take 1 capsule by mouth 2 (two) times daily.  09/22/15   [provider]  glimepiride (AMARYL) 2 MG tablet Take 1 tablet (2 mg total) by mouth daily with breakfast. 07/16/19 08/15/19  Loletha Grayer, MD  hydrALAZINE (APRESOLINE) 50 MG tablet Take 1 tablet (50 mg total) by mouth 2 (two) times daily. 07/16/19   Loletha Grayer, MD  ipratropium-albuterol (DUONEB) 0.5-2.5 (3) MG/3ML SOLN J44.1  61ml nebulizer every six hours as needed for shortness of breath 07/16/19   Loletha Grayer, MD  isosorbide mononitrate (IMDUR) 30 MG 24 hr tablet Take 30 mg by mouth daily.    [provider]  metoprolol (TOPROL-XL) 200 MG 24 hr tablet Take 1 tablet (200 mg total) by mouth daily. 09/24/17   Loletha Grayer, MD  mometasone-formoterol (DULERA) 100-5 MCG/ACT AERO Inhale 2 puffs into the lungs 2 (two) times daily. 07/16/19   Loletha Grayer, MD  pantoprazole (PROTONIX) 40 MG tablet Take 40 mg by mouth daily.    [provider]  rosuvastatin (CRESTOR) 40 MG tablet Take 40 mg by mouth daily. 04/13/19   [provider]  terbinafine (LAMISIL) 1 % cream Apply twice a day to feet (okay to substitute any anti fungal cream that is the cheapest) 07/16/19   Loletha Grayer, MD  tiotropium (SPIRIVA HANDIHALER) 18 MCG inhalation capsule Place 1 capsule (18 mcg total) into inhaler and inhale daily. 05/31/18   Henreitta Leber, MD  Vitamin D, Ergocalciferol, (DRISDOL) 1.25 MG (50000 UT) CAPS capsule Take 50,000 Units by mouth once a week.    [provider]    Allergies Patient has no known allergies.  Family History  Problem Relation Age of Onset  . CAD Mother   . Rheum arthritis Neg Hx   . Osteoarthritis Neg Hx   . Asthma Neg Hx   . Diabetes Neg Hx   . Cancer Neg Hx     Social History Social History   Tobacco Use  . Smoking status: Former Smoker    Types: Cigarettes    Quit date: 08/30/2015    Years since quitting: 4.2  . Smokeless tobacco: Never Used  Substance Use Topics  . Alcohol use: No  . Drug use: No    Review of Systems  Review of Systems  Unable to perform ROS: Mental status change     ____________________________________________  PHYSICAL EXAM:      VITAL SIGNS: ED Triage Vitals  Enc Vitals Group     BP --      Pulse Rate 11/25/19 1429 (!) 109     Resp 11/25/19 1429 (!) 29     Temp 11/25/19 1429 98 F (36.7 C)     Temp Source 11/25/19 1429 Oral     SpO2 11/25/19 1429 96 %     Weight 11/25/19 1430 200 lb 9.9 oz (91 kg)     Height --      Head Circumference --      Peak Flow --      Pain Score 11/25/19 1430 0     Pain Loc --      Pain Edu? --      Excl. in [place]? --      Physical Exam Vitals and nursing note reviewed.  Constitutional:      General: He is not in acute distress.    Appearance: He is well-developed. He is ill-appearing.  HENT:     Head: Normocephalic and atraumatic.     Mouth/Throat:     Mouth: Mucous membranes are dry.  Eyes:     Conjunctiva/sclera: Conjunctivae normal.  Cardiovascular:     Rate and Rhythm: Regular rhythm. Tachycardia present.     Heart sounds: Normal heart sounds. No murmur. No friction rub.  Pulmonary:     Effort: Pulmonary effort is normal. No respiratory  distress.     Breath sounds: Wheezing present. No rales.  Abdominal:     General: There is no distension.     Palpations: Abdomen is soft.     Tenderness: There is no abdominal tenderness.  Musculoskeletal:     Cervical back: Neck supple.  Skin:    General: Skin is warm.     Capillary Refill: Capillary refill takes less than 2 seconds.  Neurological:     Mental Status: He is alert and oriented to person, place, and time.     Motor: No abnormal muscle tone.     Comments: Disoriented, with dense expressive aphasia with nonsensical speech. Rightward gaze preference is noted. Right arm seems to be spontaneously moving less though he can resist gravity with this. Moves LUE, b/l LE with 5/5 strength. Does not follow commands. Face symmetric. Speech slightly slurred and nonsensical as above.       ____________________________________________   LABS (all labs ordered are listed, but only abnormal results are displayed)  Labs Reviewed  CBC WITH DIFFERENTIAL/PLATELET - Abnormal; Notable for the following components:      Result Value   RBC 3.85 (*)    Hemoglobin 10.7 (*)    HCT 32.1 (*)    All other components within normal limits  COMPREHENSIVE METABOLIC PANEL - Abnormal; Notable for the following components:   Sodium 134 (*)    Potassium 5.5 (*)    CO2 19 (*)    Glucose, Bld 668 (*)    BUN 27 (*)    Creatinine, Ser 2.02 (*)    AST 62 (*)    ALT 77 (*)    GFR calc non Af Amer 30 (*)    GFR calc Af Amer 35 (*)    All other components within normal limits  LACTIC ACID, PLASMA - Abnormal; Notable for the following components:   Lactic Acid, Venous 3.7 (*)    All other components within normal limits  TROPONIN  I (HIGH SENSITIVITY) - Abnormal; Notable for the following components:   Troponin I (High Sensitivity) 22 (*)    All other components within normal limits  RESPIRATORY PANEL BY RT PCR (FLU A&B, COVID)  CK  LACTIC ACID, PLASMA  BLOOD GAS, VENOUS  BETA-HYDROXYBUTYRIC ACID    URINALYSIS, COMPLETE (UACMP) WITH MICROSCOPIC  OSMOLALITY  CBG MONITORING, ED    ____________________________________________  EKG: Sinus tachycardia, VR 109. QRS 87, QTc 472. Non-specific ST changes no elevations. ________________________________________  RADIOLOGY All imaging, including plain films, CT scans, and ultrasounds, independently reviewed by me, and interpretations confirmed via formal radiology reads.  ED MD interpretation:   CXR: Neg per my prelim review CT Head: No large hemorrhage noted  Official radiology report(s): CT Head Wo Contrast  Result Date: 11/25/2019 CLINICAL DATA:  Patient was found down. EXAM: CT HEAD WITHOUT CONTRAST CT CERVICAL SPINE WITHOUT CONTRAST TECHNIQUE: Multidetector CT imaging of the head and cervical spine was performed following the standard protocol without intravenous contrast. Multiplanar CT image reconstructions of the cervical spine were also generated. COMPARISON:  CT scan dated 01/21/2019 FINDINGS: CT HEAD FINDINGS Brain: No evidence of acute infarction, hemorrhage, hydrocephalus, extra-axial collection or mass lesion/mass effect. There appears to be an old small left cerebellar hemisphere infarct, unchanged. Vascular: No hyperdense vessel or unexpected calcification. Skull: Normal. Negative for fracture or focal lesion. Sinuses/Orbits: No acute finding. Other: None CT CERVICAL SPINE FINDINGS Alignment: Straightening of the cervical lordosis. Skull base and vertebrae: No acute fracture. No primary bone lesion. Ankylosis of the C3 and C4 vertebra and left lateral masses. Soft tissues and spinal canal: Hypertrophy of the transverse ligament at C1-2. The spinal cord is not compressed at that level. No prevertebral soft tissue swelling visible canal hematoma. Disc levels: C2-3: No significant disc bulging. Moderate left facet arthritis. No foraminal stenosis. C3-4: No disc bulging or protrusion. The vertebra and left lateral masses are fused. No  foraminal stenosis. C4-5: Disc space narrowing. Osteophytes protrude into both neural foramina without significant foraminal stenosis. C5-6: Disc space narrowing with prominent degenerative changes of the vertebral endplates. Broad-based disc osteophyte complex slightly narrows the AP dimension of the spinal canal. Uncinate spurs extend into both neural foramina with moderate bilateral foraminal stenosis. C6-7: Chronic disc space narrowing. Broad-based disc osteophyte complex with moderate left foraminal stenosis. C7-T1: Moderate right facet arthritis. Otherwise negative. Upper chest: Emphysematous changes at the right lung apex. Other: None IMPRESSION: 1. No acute intracranial abnormality. 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 2. No acute abnormality of the cervical spine. Multilevel degenerative disc and joint disease. Electronically Signed   By: Lorriane Shire M.D.   On: 11/25/2019 15:28   CT Cervical Spine Wo Contrast  Result Date: 11/25/2019 CLINICAL DATA:  Patient was found down. EXAM: CT HEAD WITHOUT CONTRAST CT CERVICAL SPINE WITHOUT CONTRAST TECHNIQUE: Multidetector CT imaging of the head and cervical spine was performed following the standard protocol without intravenous contrast. Multiplanar CT image reconstructions of the cervical spine were also generated. COMPARISON:  CT scan dated 01/21/2019 FINDINGS: CT HEAD FINDINGS Brain: No evidence of acute infarction, hemorrhage, hydrocephalus, extra-axial collection or mass lesion/mass effect. There appears to be an old small left cerebellar hemisphere infarct, unchanged. Vascular: No hyperdense vessel or unexpected calcification. Skull: Normal. Negative for fracture or focal lesion. Sinuses/Orbits: No acute finding. Other: None CT CERVICAL SPINE FINDINGS Alignment: Straightening of the cervical lordosis. Skull base and vertebrae: No acute fracture. No primary bone lesion. Ankylosis of the C3 and C4 vertebra and left  lateral masses. Soft tissues  and spinal canal: Hypertrophy of the transverse ligament at C1-2. The spinal cord is not compressed at that level. No prevertebral soft tissue swelling visible canal hematoma. Disc levels: C2-3: No significant disc bulging. Moderate left facet arthritis. No foraminal stenosis. C3-4: No disc bulging or protrusion. The vertebra and left lateral masses are fused. No foraminal stenosis. C4-5: Disc space narrowing. Osteophytes protrude into both neural foramina without significant foraminal stenosis. C5-6: Disc space narrowing with prominent degenerative changes of the vertebral endplates. Broad-based disc osteophyte complex slightly narrows the AP dimension of the spinal canal. Uncinate spurs extend into both neural foramina with moderate bilateral foraminal stenosis. C6-7: Chronic disc space narrowing. Broad-based disc osteophyte complex with moderate left foraminal stenosis. C7-T1: Moderate right facet arthritis. Otherwise negative. Upper chest: Emphysematous changes at the right lung apex. Other: None IMPRESSION: 1. No acute intracranial abnormality. 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 2. No acute abnormality of the cervical spine. Multilevel degenerative disc and joint disease. Electronically Signed   By: Lorriane Shire M.D.   On: 11/25/2019 15:28   DG Chest Portable 1 View  Result Date: 11/25/2019 CLINICAL DATA:  Altered mental status, history lymphoma, coronary artery disease, diabetes mellitus, CHF, hypertension, former smoker EXAM: PORTABLE CHEST 1 VIEW COMPARISON:  Portable exam 1509 hours compared 07/14/2019 FINDINGS: Upper normal heart size. Mediastinal contours and pulmonary vascularity normal. Atherosclerotic calcification aorta. Lungs clear. No infiltrate, pleural effusion or pneumothorax. No acute osseous findings. IMPRESSION: No acute abnormalities. Aortic Atherosclerosis (ICD10-I70.0). Electronically Signed   By: Lavonia Dana M.D.   On: 11/25/2019 15:26     ____________________________________________  PROCEDURES   Procedure(s) performed (including Critical Care):  .Critical Care Performed by: Duffy Bruce, MD Authorized by: Duffy Bruce, MD   Critical care provider statement:    Critical care time (minutes):  35   Critical care time was exclusive of:  Separately billable procedures and treating other patients and teaching time   Critical care was necessary to treat or prevent imminent or life-threatening deterioration of the following conditions:  Cardiac failure, circulatory failure and CNS failure or compromise   Critical care was time spent personally by me on the following activities:  Development of treatment plan with patient or surrogate, discussions with consultants, evaluation of patient's response to treatment, examination of patient, obtaining history from patient or surrogate, ordering and performing treatments and interventions, ordering and review of laboratory studies, ordering and review of radiographic studies, pulse oximetry, re-evaluation of patient's condition and review of old charts   I assumed direction of critical care for this patient from another provider in my specialty: no      ____________________________________________  INITIAL IMPRESSION / MDM / [place] / ED COURSE  As part of my medical decision making, I reviewed the following data within the [place] notes reviewed and incorporated, Old chart reviewed, Notes from prior ED visits, and [place] Controlled Substance Database       *DECLYN DELSOL was evaluated in Emergency Department on 11/25/2019 for the symptoms described in the history of present illness. He was evaluated in the context of the global COVID-19 pandemic, which necessitated consideration that the patient might be at risk for infection with the SARS-CoV-2 virus that causes COVID-19. Institutional protocols and algorithms that pertain to the  evaluation of patients at risk for COVID-19 are in a state of rapid change based on information released by regulatory bodies including the CDC and federal and state organizations.  These policies and algorithms were followed during the patient's care in the ED.  Some ED evaluations and interventions may be delayed as a result of limited staffing during the pandemic.*     Medical Decision Making:  84 yo M here with acute encephalopathy. Etiology unclear at this time. Differential includes CVA, HHS/DKA with metabolic encephalopathy, hyercapneic resp failure w/ encephalopathy, or other infectious trigger causing recrudescence of occult old stroke. Last known normal was earlier than 9 PM yesterday and pt does not meet LVO/VAN or code stroke criteria. Discussed with Dr. Irish Elders of Neuro who is in agreement. Pt will be taken for stat CT Head/CSpine, CXR, and labs sent. Haldol IV given 2/2 acute agitation and need for neuro imaging.   ____________________________________________  FINAL CLINICAL IMPRESSION(S) / ED DIAGNOSES  Final diagnoses:  Acute encephalopathy     MEDICATIONS GIVEN DURING THIS VISIT:  Medications  ipratropium-albuterol (DUONEB) 0.5-2.5 (3) MG/3ML nebulizer solution 3 mL (has no administration in time range)  sodium chloride 0.9 % bolus 1,000 mL (has no administration in time range)  haloperidol lactate (HALDOL) injection 2.5 mg (has no administration in time range)  haloperidol lactate (HALDOL) injection 2.5 mg (2.5 mg Intravenous Given 11/25/19 1505)     ED Discharge Orders    None       Note:  This document was prepared using Dragon voice recognition software and may include unintentional dictation errors.   Duffy Bruce, MD 11/25/19 [phone]

## 2019-11-25 NOTE — ED Notes (Signed)
Pt back from ct - had to be medicated while there. Pt still trying to climb out of bed, pulling off monitor and bp cuff. Still unable to answer questions, keeps repeating that he wants water? Pt is hard to understand and switches to another language (german?)

## 2019-11-25 NOTE — ED Triage Notes (Signed)
Ems called out by neighbors - pt was pounding on the wall and yelling. Found on floor. Unable to answer questions. Gazes off to the left. bs read high for ems.

## 2019-11-25 NOTE — ED Notes (Signed)
Pt to mri 

## 2019-11-25 NOTE — ED Notes (Signed)
Attempted nih stroke scale - pt is unable to follow directions. Only looks to the right and is unable to verbally communicate (does not answer questions, just yells and cusses. Unsure if pt is speaking in another language at times? Moves his extremities but not to command.

## 2019-11-25 NOTE — ED Notes (Signed)
Pt medicated with ativan and is now resting easier with respirations averaging 22. Pulse is still 127 - thyroid panel has been added.

## 2019-11-26 ENCOUNTER — Observation Stay: Payer: Medicare PPO

## 2019-11-26 ENCOUNTER — Inpatient Hospital Stay: Payer: Medicare PPO

## 2019-11-26 ENCOUNTER — Inpatient Hospital Stay (HOSPITAL_COMMUNITY)
Admit: 2019-11-26 | Discharge: 2019-11-26 | Disposition: A | Discharge status: Home / Self Care | Payer: Medicare PPO | Attending: Internal Medicine | Admitting: Internal Medicine

## 2019-11-26 DIAGNOSIS — R4182 Altered mental status, unspecified: Secondary | ICD-10-CM | POA: Diagnosis present

## 2019-11-26 DIAGNOSIS — A419 Sepsis, unspecified organism: Secondary | ICD-10-CM | POA: Diagnosis present

## 2019-11-26 DIAGNOSIS — E785 Hyperlipidemia, unspecified: Secondary | ICD-10-CM | POA: Diagnosis present

## 2019-11-26 DIAGNOSIS — I351 Nonrheumatic aortic (valve) insufficiency: Secondary | ICD-10-CM | POA: Diagnosis not present

## 2019-11-26 DIAGNOSIS — J9601 Acute respiratory failure with hypoxia: Secondary | ICD-10-CM

## 2019-11-26 DIAGNOSIS — E860 Dehydration: Secondary | ICD-10-CM | POA: Diagnosis present

## 2019-11-26 DIAGNOSIS — N1832 Chronic kidney disease, stage 3b: Secondary | ICD-10-CM | POA: Diagnosis present

## 2019-11-26 DIAGNOSIS — G9341 Metabolic encephalopathy: Secondary | ICD-10-CM | POA: Diagnosis present

## 2019-11-26 DIAGNOSIS — J690 Pneumonitis due to inhalation of food and vomit: Secondary | ICD-10-CM | POA: Diagnosis not present

## 2019-11-26 DIAGNOSIS — G9340 Encephalopathy, unspecified: Secondary | ICD-10-CM | POA: Diagnosis not present

## 2019-11-26 DIAGNOSIS — J439 Emphysema, unspecified: Secondary | ICD-10-CM | POA: Diagnosis present

## 2019-11-26 DIAGNOSIS — N179 Acute kidney failure, unspecified: Secondary | ICD-10-CM | POA: Diagnosis present

## 2019-11-26 DIAGNOSIS — E870 Hyperosmolality and hypernatremia: Secondary | ICD-10-CM | POA: Diagnosis not present

## 2019-11-26 DIAGNOSIS — I2510 Atherosclerotic heart disease of native coronary artery without angina pectoris: Secondary | ICD-10-CM | POA: Diagnosis present

## 2019-11-26 DIAGNOSIS — R748 Abnormal levels of other serum enzymes: Secondary | ICD-10-CM | POA: Diagnosis present

## 2019-11-26 DIAGNOSIS — I480 Paroxysmal atrial fibrillation: Secondary | ICD-10-CM | POA: Diagnosis present

## 2019-11-26 DIAGNOSIS — I10 Essential (primary) hypertension: Secondary | ICD-10-CM | POA: Diagnosis not present

## 2019-11-26 DIAGNOSIS — I361 Nonrheumatic tricuspid (valve) insufficiency: Secondary | ICD-10-CM

## 2019-11-26 DIAGNOSIS — I5033 Acute on chronic diastolic (congestive) heart failure: Secondary | ICD-10-CM | POA: Diagnosis not present

## 2019-11-26 DIAGNOSIS — I083 Combined rheumatic disorders of mitral, aortic and tricuspid valves: Secondary | ICD-10-CM | POA: Diagnosis present

## 2019-11-26 DIAGNOSIS — R778 Other specified abnormalities of plasma proteins: Secondary | ICD-10-CM | POA: Diagnosis not present

## 2019-11-26 DIAGNOSIS — J432 Centrilobular emphysema: Secondary | ICD-10-CM | POA: Diagnosis not present

## 2019-11-26 DIAGNOSIS — E1122 Type 2 diabetes mellitus with diabetic chronic kidney disease: Secondary | ICD-10-CM | POA: Diagnosis present

## 2019-11-26 DIAGNOSIS — Z66 Do not resuscitate: Secondary | ICD-10-CM | POA: Diagnosis present

## 2019-11-26 DIAGNOSIS — Z20822 Contact with and (suspected) exposure to covid-19: Secondary | ICD-10-CM | POA: Diagnosis present

## 2019-11-26 DIAGNOSIS — R6520 Severe sepsis without septic shock: Secondary | ICD-10-CM | POA: Diagnosis not present

## 2019-11-26 DIAGNOSIS — E1100 Type 2 diabetes mellitus with hyperosmolarity without nonketotic hyperglycemic-hyperosmolar coma (NKHHC): Secondary | ICD-10-CM | POA: Diagnosis present

## 2019-11-26 DIAGNOSIS — E871 Hypo-osmolality and hyponatremia: Secondary | ICD-10-CM | POA: Diagnosis present

## 2019-11-26 DIAGNOSIS — E1151 Type 2 diabetes mellitus with diabetic peripheral angiopathy without gangrene: Secondary | ICD-10-CM | POA: Diagnosis present

## 2019-11-26 DIAGNOSIS — I5032 Chronic diastolic (congestive) heart failure: Secondary | ICD-10-CM | POA: Diagnosis not present

## 2019-11-26 DIAGNOSIS — I130 Hypertensive heart and chronic kidney disease with heart failure and stage 1 through stage 4 chronic kidney disease, or unspecified chronic kidney disease: Secondary | ICD-10-CM | POA: Diagnosis present

## 2019-11-26 DIAGNOSIS — N183 Chronic kidney disease, stage 3 unspecified: Secondary | ICD-10-CM | POA: Diagnosis not present

## 2019-11-26 DIAGNOSIS — I69991 Dysphagia following unspecified cerebrovascular disease: Secondary | ICD-10-CM | POA: Diagnosis not present

## 2019-11-26 DIAGNOSIS — J181 Lobar pneumonia, unspecified organism: Secondary | ICD-10-CM | POA: Diagnosis not present

## 2019-11-26 DIAGNOSIS — E875 Hyperkalemia: Secondary | ICD-10-CM | POA: Diagnosis present

## 2019-11-26 DIAGNOSIS — R4701 Aphasia: Secondary | ICD-10-CM | POA: Diagnosis present

## 2019-11-26 DIAGNOSIS — C8510 Unspecified B-cell lymphoma, unspecified site: Secondary | ICD-10-CM | POA: Diagnosis present

## 2019-11-26 DIAGNOSIS — J189 Pneumonia, unspecified organism: Secondary | ICD-10-CM | POA: Diagnosis not present

## 2019-11-26 LAB — COMPREHENSIVE METABOLIC PANEL
ALT: 61 U/L — ABNORMAL HIGH (ref 0–44)
AST: 48 U/L — ABNORMAL HIGH (ref 15–41)
Albumin: 3.2 g/dL — ABNORMAL LOW (ref 3.5–5.0)
Alkaline Phosphatase: 102 U/L (ref 38–126)
Anion gap: 10 (ref 5–15)
BUN: 18 mg/dL (ref 8–23)
CO2: 24 mmol/L (ref 22–32)
Calcium: 8.4 mg/dL — ABNORMAL LOW (ref 8.9–10.3)
Chloride: 103 mmol/L (ref 98–111)
Creatinine, Ser: 1.53 mg/dL — ABNORMAL HIGH (ref 0.61–1.24)
GFR calc Af Amer: 48 mL/min — ABNORMAL LOW (ref >60)
GFR calc non Af Amer: 42 mL/min — ABNORMAL LOW (ref >60)
Glucose, Bld: 341 mg/dL — ABNORMAL HIGH (ref 70–99)
Potassium: 4.5 mmol/L (ref 3.5–5.1)
Sodium: 137 mmol/L (ref 135–145)
Total Bilirubin: 0.8 mg/dL (ref 0.3–1.2)
Total Protein: 6.2 g/dL — ABNORMAL LOW (ref 6.5–8.1)

## 2019-11-26 LAB — BLOOD GAS, ARTERIAL
Acid-Base Excess: 4.1 mmol/L — ABNORMAL HIGH (ref 0.0–2.0)
Bicarbonate: 28.5 mmol/L — ABNORMAL HIGH (ref 20.0–28.0)
FIO2: 28
O2 Saturation: 98.3 %
Patient temperature: 37
pCO2 arterial: 41 mmHg (ref 32.0–48.0)
pH, Arterial: 7.45 (ref 7.350–7.450)
pO2, Arterial: 105 mmHg (ref 83.0–108.0)

## 2019-11-26 LAB — CBC
HCT: 33.3 % — ABNORMAL LOW (ref 39.0–52.0)
Hemoglobin: 10.9 g/dL — ABNORMAL LOW (ref 13.0–17.0)
MCH: 27.3 pg (ref 26.0–34.0)
MCHC: 32.7 g/dL (ref 30.0–36.0)
MCV: 83.5 fL (ref 80.0–100.0)
Platelets: 156 10*3/uL (ref 150–400)
RBC: 3.99 MIL/uL — ABNORMAL LOW (ref 4.22–5.81)
RDW: 14.7 % (ref 11.5–15.5)
WBC: 8.3 10*3/uL (ref 4.0–10.5)
nRBC: 0 % (ref 0.0–0.2)

## 2019-11-26 LAB — GLUCOSE, CAPILLARY
Glucose-Capillary: 165 mg/dL — ABNORMAL HIGH (ref 70–99)
Glucose-Capillary: 185 mg/dL — ABNORMAL HIGH (ref 70–99)
Glucose-Capillary: 219 mg/dL — ABNORMAL HIGH (ref 70–99)
Glucose-Capillary: 236 mg/dL — ABNORMAL HIGH (ref 70–99)
Glucose-Capillary: 317 mg/dL — ABNORMAL HIGH (ref 70–99)
Glucose-Capillary: 317 mg/dL — ABNORMAL HIGH (ref 70–99)
Glucose-Capillary: 330 mg/dL — ABNORMAL HIGH (ref 70–99)

## 2019-11-26 LAB — OSMOLALITY
Osmolality: 324 mOsm/kg (ref 275–295)
Osmolality: 306 mOsm/kg — ABNORMAL HIGH (ref 275–295)

## 2019-11-26 LAB — BASIC METABOLIC PANEL
Anion gap: 12 (ref 5–15)
BUN: 21 mg/dL (ref 8–23)
CO2: 25 mmol/L (ref 22–32)
Calcium: 8.8 mg/dL — ABNORMAL LOW (ref 8.9–10.3)
Chloride: 105 mmol/L (ref 98–111)
Creatinine, Ser: 1.54 mg/dL — ABNORMAL HIGH (ref 0.61–1.24)
GFR calc Af Amer: 48 mL/min — ABNORMAL LOW (ref >60)
GFR calc non Af Amer: 41 mL/min — ABNORMAL LOW (ref >60)
Glucose, Bld: 257 mg/dL — ABNORMAL HIGH (ref 70–99)
Potassium: 4.1 mmol/L (ref 3.5–5.1)
Sodium: 142 mmol/L (ref 135–145)

## 2019-11-26 LAB — FIBRIN DERIVATIVES D-DIMER (ARMC ONLY): Fibrin derivatives D-dimer (ARMC): 901.75 ng/mL (FEU) — ABNORMAL HIGH (ref 0.00–499.00)

## 2019-11-26 LAB — AMMONIA: Ammonia: 26 umol/L (ref 9–35)

## 2019-11-26 LAB — ETHANOL: Alcohol, Ethyl (B): <10 mg/dL (ref <10)

## 2019-11-26 LAB — LACTIC ACID, PLASMA: Lactic Acid, Venous: 1.9 mmol/L (ref 0.5–1.9)

## 2019-11-26 MED ORDER — SODIUM CHLORIDE 0.9 % IV SOLN
1.0000 g | INTRAVENOUS | Status: AC
  Administered 2019-11-26 – 2019-11-30 (×5): 1 g via INTRAVENOUS
  Filled 2019-11-26 (×3): qty 1
  Filled 2019-11-26 (×2): qty 10
  Filled 2019-11-26: qty 1

## 2019-11-26 MED ORDER — SODIUM CHLORIDE 0.9 % IV SOLN: INTRAVENOUS | Status: DC

## 2019-11-26 MED ORDER — SODIUM CHLORIDE 0.9 % IV SOLN: Freq: Once | INTRAVENOUS | Status: DC

## 2019-11-26 MED ORDER — INSULIN ASPART 100 UNIT/ML ~~LOC~~ SOLN
0.0000 [IU] | SUBCUTANEOUS | Status: DC
  Administered 2019-11-26 (×3): 7 [IU] via SUBCUTANEOUS
  Administered 2019-11-26: 3 [IU] via SUBCUTANEOUS
  Filled 2019-11-26 (×3): qty 1

## 2019-11-26 MED ORDER — INSULIN ASPART 100 UNIT/ML ~~LOC~~ SOLN
SUBCUTANEOUS | Status: AC
  Administered 2019-11-27: 7 [IU] via SUBCUTANEOUS
  Filled 2019-11-26: qty 1

## 2019-11-26 MED ORDER — MORPHINE SULFATE (PF) 2 MG/ML IV SOLN
2.0000 mg | Freq: Once | INTRAVENOUS | Status: AC
  Administered 2019-11-26: 2 mg via INTRAVENOUS
  Filled 2019-11-26: qty 1

## 2019-11-26 MED ORDER — STROKE: EARLY STAGES OF RECOVERY BOOK: Freq: Once | Status: DC

## 2019-11-26 MED ORDER — HALOPERIDOL LACTATE 5 MG/ML IJ SOLN
INTRAMUSCULAR | Status: AC
  Administered 2019-11-26: 5 mg via INTRAMUSCULAR
  Filled 2019-11-26: qty 1

## 2019-11-26 MED ORDER — ONDANSETRON HCL 4 MG PO TABS
4.0000 mg | ORAL_TABLET | Freq: Four times a day (QID) | ORAL | Status: DC | PRN
  Filled 2019-11-26: qty 1

## 2019-11-26 MED ORDER — INSULIN GLARGINE 100 UNIT/ML ~~LOC~~ SOLN
10.0000 [IU] | Freq: Every day | SUBCUTANEOUS | Status: DC
  Administered 2019-11-26 – 2019-11-30 (×5): 10 [IU] via SUBCUTANEOUS
  Filled 2019-11-26 (×7): qty 0.1

## 2019-11-26 MED ORDER — ACETAMINOPHEN 325 MG PO TABS: 650.0000 mg | ORAL_TABLET | Freq: Four times a day (QID) | ORAL | Status: DC | PRN

## 2019-11-26 MED ORDER — HYDRALAZINE HCL 20 MG/ML IJ SOLN
10.0000 mg | Freq: Four times a day (QID) | INTRAMUSCULAR | Status: DC | PRN
  Filled 2019-11-26: qty 0.5

## 2019-11-26 MED ORDER — HALOPERIDOL LACTATE 5 MG/ML IJ SOLN
5.0000 mg | Freq: Four times a day (QID) | INTRAMUSCULAR | Status: DC | PRN
  Administered 2019-11-26 – 2019-11-30 (×4): 5 mg via INTRAMUSCULAR
  Filled 2019-11-26 (×4): qty 1

## 2019-11-26 MED ORDER — INSULIN ASPART 100 UNIT/ML ~~LOC~~ SOLN
SUBCUTANEOUS | Status: AC
  Administered 2019-11-26: 5 [IU] via SUBCUTANEOUS
  Filled 2019-11-26: qty 1

## 2019-11-26 MED ORDER — ONDANSETRON HCL 4 MG/2ML IJ SOLN: 4.0000 mg | Freq: Four times a day (QID) | INTRAMUSCULAR | Status: DC | PRN

## 2019-11-26 MED ORDER — SODIUM CHLORIDE 0.9 % IV SOLN
500.0000 mg | INTRAVENOUS | Status: AC
  Administered 2019-11-26 – 2019-11-30 (×5): 500 mg via INTRAVENOUS
  Filled 2019-11-26 (×6): qty 500

## 2019-11-26 MED ORDER — INSULIN REGULAR(HUMAN) IN NACL 100-0.9 UT/100ML-% IV SOLN
INTRAVENOUS | Status: DC
  Filled 2019-11-26: qty 100

## 2019-11-26 MED ORDER — ACETAMINOPHEN 650 MG RE SUPP
650.0000 mg | Freq: Four times a day (QID) | RECTAL | Status: DC | PRN
  Filled 2019-11-26: qty 1

## 2019-11-26 MED ORDER — DEXTROSE 50 % IV SOLN: 0.0000 mL | INTRAVENOUS | Status: DC | PRN

## 2019-11-26 MED ORDER — SODIUM CHLORIDE 0.9 % IV BOLUS
250.0000 mL | Freq: Once | INTRAVENOUS | Status: AC
  Administered 2019-11-26: 250 mL via INTRAVENOUS

## 2019-11-26 MED ORDER — ACETAMINOPHEN 650 MG RE SUPP
650.0000 mg | RECTAL | Status: DC | PRN
  Administered 2019-11-26 – 2019-11-28 (×4): 650 mg via RECTAL
  Filled 2019-11-26 (×3): qty 1

## 2019-11-26 MED ORDER — METOPROLOL TARTRATE 5 MG/5ML IV SOLN
5.0000 mg | Freq: Three times a day (TID) | INTRAVENOUS | Status: DC | PRN
  Administered 2019-11-26: 5 mg via INTRAVENOUS
  Filled 2019-11-26: qty 5

## 2019-11-26 MED ORDER — ENOXAPARIN SODIUM 40 MG/0.4ML ~~LOC~~ SOLN: 40.0000 mg | Freq: Two times a day (BID) | SUBCUTANEOUS | Status: DC

## 2019-11-26 NOTE — ED Notes (Addendum)
Pt currently calmly laying in bed. Sitter given glucometer and agrees to check pt's BG. Pt pulled off cardiac monitor leads. Will attempt to replace.

## 2019-11-26 NOTE — Progress Notes (Signed)
Inpatient Diabetes Program Recommendations  AACE/ADA: New Consensus Statement on Inpatient Glycemic Control (2015)  Target Ranges:  Prepandial:   less than 140 mg/dL      Peak postprandial:   less than 180 mg/dL (1-2 hours)      Critically ill patients:  140 - 180 mg/dL   Results for HERNDON, GRILL (MRN [id number]) as of 11/26/2019 09:41  Ref. Range 11/25/2019 17:58 11/25/2019 19:05 11/25/2019 20:53 11/25/2019 23:25 11/26/2019 00:22 11/26/2019 01:31 11/26/2019 02:43 11/26/2019 03:55  Glucose-Capillary Latest Ref Range: 70 - 99 mg/dL 492 (H)  IV Insulin Drip Initiated 421 (H) 287 (H) 212 (H) 236 (H) 185 (H) 165 (H)  IV Insulin Drip OFF 219 (H)  3 units NOVOLOG    Results for ASHRAF, MESTA (MRN [id number]) as of 11/26/2019 09:41  Ref. Range 05/23/2019 00:54  Hemoglobin A1C Latest Ref Range: 4.8 - 5.6 % 7.2 (H)    Admit with: Acute encephalopathy versus TIA/CVA  History: DM, CHF, COPD, B-cell lymphoma status post chemotherapy  Home DM Meds: Amaryl 4 mg Daily  Current Orders: Lantus 10 units QHS      Novolog Sensitive Correction Scale/ SSI (0-9 units) Q4 hours     IV Insulin Drip stopped at 2:45am--No long-acting insulin given prior to d/c of the IV Insulin Drip.  Novolog SSi started at 4am.     MD- Note patient taken off the IV Insulin drip earlier this AM--No basal insulin given prior to d/c of the IV Insulin Drip  Has orders to start Lantus 10 units QHS tonight at bedtime  Please consider starting Lantus this AM instead of waiting until tonight     --Will follow patient during hospitalization--  Wyn Quaker RN, MSN, CDE Diabetes Coordinator Inpatient Glycemic Control Team Team Pager: [phone] (8a-5p)

## 2019-11-26 NOTE — ED Notes (Signed)
Patient incontinent of urine. Patient changed and repositioned

## 2019-11-26 NOTE — Progress Notes (Signed)
PT Cancellation Note  Patient Details Name: Brett [name] MRN: [id number] DOB: 1935/10/30   Cancelled Treatment:    Reason Eval/Treat Not Completed: Patient's level of consciousness: Per conversation with nursing pt with significant AMS at this time and unable to follow commands.  Will attempt PT evaluation at a future date/time as medically appropriate.    Linus Salmons PT, DPT 11/26/19, 12:56 PM

## 2019-11-26 NOTE — Progress Notes (Signed)
*  PRELIMINARY RESULTS* Echocardiogram 2D Echocardiogram has been performed.  Brett [name] 11/26/2019, 8:13 PM

## 2019-11-26 NOTE — ED Notes (Signed)
Pt currently snoring. Chest rise and fall visible. Gets combative if this RN attempts to wake him. Pt remains AMS and is currently under influence of Haldol d/t continuous removal of monitor cords, pulling at IV lines (currently gauze wrapped), and tangling self in cords in bed. Will complete NIH once pt able to participate.

## 2019-11-26 NOTE — ED Notes (Signed)
Pt keeps pulling off pulse ox.

## 2019-11-26 NOTE — Progress Notes (Signed)
Notified Dr Eppie Gibson of blood sugar of 290 @ 1830. She stated to continue with the insulin sliding scale.

## 2019-11-26 NOTE — Progress Notes (Signed)
Report received. Pt resting in bed without distress. Sitter at bedside.

## 2019-11-26 NOTE — Progress Notes (Signed)
PROGRESS NOTE    Brett [name]  JOI:[id number] DOB: [birth year] DOA: 11/25/2019 PCP: Perrin Maltese, MD      Assessment & Plan:   Active Problems:   B-cell lymphoma (HCC)   COPD (chronic obstructive pulmonary disease) (HCC)   HTN (hypertension)   Diabetes (HCC)   CAD (coronary artery disease)   Chronic diastolic heart failure (HCC)   Stroke (HCC)   COPD exacerbation (HCC)   Type 2 diabetes mellitus with hyperosmolar nonketotic hyperglycemia (HCC)   AF (paroxysmal atrial fibrillation) (HCC)   Acute encephalopathy   High serum osmolar gap   Possibly acute metabolic encephalopathy: vs TIA vs CVA. CT brain & MRI brain showed no acute abnormalities. Ammonia is WNL. PT/OT consulted. Neuro consulted and awaiting recs. NPO. Speech consulted   Sepsis: etiology unclear. Blood & urine cxs ordered. Tylenol prn for fevers. Will start IV azithromycin & ceftriaxone.  Acute hypoxic respiratory failure: etiology unclear. CXR shows no acute findings. Continue on supplemental oxygen and wean as tolerated   AKI: baseline Cr is unknown. Cr is trending down today. Will continue to monitor    B-cell lymphoma: chronic & currently in remission  COPD: continue nebs, incentive spirometry. CXR shows no acute abnormalities. VBG obtained no evidence of CO2 retention to explain mental status changes.  HTN: IV hydralazine prn   CAD: chronic & stable. When able to tolerate p.o. resume home medications  Chronic diastolic heart failure: stable. W/o exacerbation. Will restart lasix when pt is able to tolerate po meds     DM2: poorly controlled. Continue on lantus and SSI w/ accuchecks   PAF: IV metoprolol prn for HR >130. Patient apparently not anticoagulation he has amiodarone with his list of medication & his MPOA was unaware the patient has atrial fibrillation.   Elevated lactic acid: resolved. Etiology unclear.   Abdominal distention: etiology unclear. KUB & CT abd/pelvis shows no acute  abnormailities   Transaminitis: etiology unclear. Will continue to monitor     DVT prophylaxis: SCDs Code Status: DNR Family Communication:  Disposition Plan:   Consultants:  Neuro   Procedures:    Antimicrobials: azithromycin, ceftriaxone    Subjective: Pt is only oriented to person and unable to answer questions appropriately.   Objective: Vitals:   11/26/19 0500 11/26/19 0530 11/26/19 0600 11/26/19 0630  BP: (!) 182/95 (!) 174/94  (!) 174/90  Pulse: (!) 132 (!) 124  (!) 125  Resp: (!) 26   (!) 24  Temp:   (!) 100.4 F (38 C)   TempSrc:   Rectal   SpO2: 99% 100%  95%  Weight:      Height:        Intake/Output Summary (Last 24 hours) at 11/26/2019 0847 Last data filed at 11/26/2019 0307 Gross per 24 hour  Intake 1400 ml  Output --  Net 1400 ml   Filed Weights   11/25/19 1430  Weight: 91 kg    Examination:  General exam: Appears uncomfortable   Respiratory system:  Diminished breath sounds b/l. No rales Cardiovascular system: S1 & S2 +. No rubs, gallops or clicks. B/l LE  2+ pitting edema Gastrointestinal system: Abdomen is obese, soft and nontender.  Hypoactive bowel sounds heard. Central nervous system: oriented to self only. Not following commands Psychiatry: Judgement and insight appear abnormal. Flat mood and affect      Data Reviewed: I have personally reviewed following labs and imaging studies  CBC: Recent Labs  Lab 11/25/19 1450  WBC 6.2  NEUTROABS  4.6  HGB 10.7*  HCT 32.1*  MCV 83.4  PLT 875   Basic Metabolic Panel: Recent Labs  Lab 11/25/19 1450 11/25/19 2204 11/26/19 0419  NA 134* 142 142  K 5.5* 3.8 4.1  CL 100 105 105  CO2 19* 24 25  GLUCOSE 668* 238* 257*  BUN 27* 21 21  CREATININE 2.02* 1.69* 1.54*  CALCIUM 9.1 9.2 8.8*   GFR: Estimated Creatinine Clearance: 42 mL/min (A) (by C-G formula based on SCr of 1.54 mg/dL (H)). Liver Function Tests: Recent Labs  Lab 11/25/19 1450  AST 62*  ALT 77*  ALKPHOS 119   BILITOT 0.7  PROT 6.7  ALBUMIN 3.6   No results for input(s): LIPASE, AMYLASE in the last 168 hours. Recent Labs  Lab 11/26/19 0058  AMMONIA 26   Coagulation Profile: No results for input(s): INR, PROTIME in the last 168 hours. Cardiac Enzymes: Recent Labs  Lab 11/25/19 1450  CKTOTAL 164   BNP (last 3 results) No results for input(s): PROBNP in the last 8760 hours. HbA1C: No results for input(s): HGBA1C in the last 72 hours. CBG: Recent Labs  Lab 11/25/19 2325 11/26/19 0022 11/26/19 0131 11/26/19 0243 11/26/19 0355  GLUCAP 212* 236* 185* 165* 219*   Lipid Profile: No results for input(s): CHOL, HDL, LDLCALC, TRIG, CHOLHDL, LDLDIRECT in the last 72 hours. Thyroid Function Tests: Recent Labs    11/25/19 2204  TSH 1.380  FREET4 1.05   Anemia Panel: No results for input(s): VITAMINB12, FOLATE, FERRITIN, TIBC, IRON, RETICCTPCT in the last 72 hours. Sepsis Labs: Recent Labs  Lab 11/25/19 1450 11/25/19 2204 11/26/19 0058  LATICACIDVEN 3.7* 3.0* 1.9    Recent Results (from the past 240 hour(s))  Respiratory Panel by RT PCR (Flu A&B, Covid) - Urine, Clean Catch     Status: None   Collection Time: 11/25/19  4:22 PM   Specimen: Urine, Clean Catch  Result Value Ref Range Status   SARS Coronavirus 2 by RT PCR NEGATIVE NEGATIVE Final    Comment: (NOTE) SARS-CoV-2 target nucleic acids are NOT DETECTED. The SARS-CoV-2 RNA is generally detectable in upper respiratoy specimens during the acute phase of infection. The lowest concentration of SARS-CoV-2 viral copies this assay can detect is 131 copies/mL. A negative result does not preclude SARS-Cov-2 infection and should not be used as the sole basis for treatment or other patient management decisions. A negative result may occur with  improper specimen collection/handling, submission of specimen other than nasopharyngeal swab, presence of viral mutation(s) within the areas targeted by this assay, and inadequate  number of viral copies (<131 copies/mL). A negative result must be combined with clinical observations, patient history, and epidemiological information. The expected result is Negative. Fact Sheet for Patients:  PinkCheek.be Fact Sheet for Healthcare Providers:  GravelBags.it This test is not yet ap proved or cleared by the [place] FDA and  has been authorized for detection and/or diagnosis of SARS-CoV-2 by FDA under an Emergency Use Authorization (EUA). This EUA will remain  in effect (meaning this test can be used) for the duration of the COVID-19 declaration under Section 564(b)(1) of the Act, 21 U.S.C. section 360bbb-3(b)(1), unless the authorization is terminated or revoked sooner.    Influenza A by PCR NEGATIVE NEGATIVE Final   Influenza B by PCR NEGATIVE NEGATIVE Final    Comment: (NOTE) The Xpert Xpress SARS-CoV-2/FLU/RSV assay is intended as an aid in  the diagnosis of influenza from Nasopharyngeal swab specimens and  should not be used as  a sole basis for treatment. Nasal washings and  aspirates are unacceptable for Xpert Xpress SARS-CoV-2/FLU/RSV  testing. Fact Sheet for Patients: PinkCheek.be Fact Sheet for Healthcare Providers: GravelBags.it This test is not yet approved or cleared by the [place] FDA and  has been authorized for detection and/or diagnosis of SARS-CoV-2 by  FDA under an Emergency Use Authorization (EUA). This EUA will remain  in effect (meaning this test can be used) for the duration of the  Covid-19 declaration under Section 564(b)(1) of the Act, 21  U.S.C. section 360bbb-3(b)(1), unless the authorization is  terminated or revoked. Performed at [facility], [address], [place], [place] [address]          Radiology Studies: CT ABDOMEN PELVIS WO CONTRAST  Result Date: 11/26/2019 CLINICAL DATA:   Abdominal distension with elevated lactate. EXAM: CT ABDOMEN AND PELVIS WITHOUT CONTRAST TECHNIQUE: Multidetector CT imaging of the abdomen and pelvis was performed following the standard protocol without IV contrast. COMPARISON:  None. FINDINGS: LOWER CHEST: No basilar pleural or apical pericardial effusion. HEPATOBILIARY: Diffuse hypoattenuation of the liver relative to the spleen suggests hepatic steatosis. No focal liver lesion or biliary dilatation. There is focal fatty sparing along the gallbladder fossa and along the anterior hepatic edge. Normal gallbladder. PANCREAS: Normal pancreas. No ductal dilatation or peripancreatic fluid collection. SPLEEN: Normal. ADRENALS/URINARY TRACT: --Adrenal glands: Normal. --Kidneys and ureters: No hydronephrosis, nephroureterolithiasis or solid renal mass. Mild bilateral perinephric stranding, likely chronic. --Urinary bladder: Normal for degree of distention STOMACH/BOWEL: --Stomach/Duodenum: No hiatal hernia. Normal duodenal course and caliber. --Small bowel: No dilatation or inflammation. --Colon: No focal abnormality. --Appendix: Normal. VASCULAR/LYMPHATIC: There is calcific atherosclerosis of the abdominal aorta. No abdominal or pelvic lymphadenopathy. REPRODUCTIVE: Normal prostate size with symmetric seminal vesicles. MUSCULOSKELETAL. No bony spinal canal stenosis or focal osseous abnormality. OTHER: None. IMPRESSION: 1. No acute abdominal or pelvic abnormality. 2. Hepatic steatosis. 3. Aortic Atherosclerosis (ICD10-I70.0). Electronically Signed   By: Ulyses Jarred M.D.   On: 11/26/2019 00:26   DG Abd 1 View  Result Date: 11/25/2019 CLINICAL DATA:  Abdominal distension EXAM: ABDOMEN - 1 VIEW COMPARISON:  CT abdomen pelvis 05/13/2010 FINDINGS: No high-grade obstructive bowel gas pattern. No suspicious calcifications over the gallbladder fossa. Coarse calcification is seen in the region of the left renal pelvis. No calcifications seen along the course of either  ureter. Vascular calcium noted in the pelvis. Degenerative changes in the spine and hips. Lung bases are clear. Included mediastinal contours are unremarkable. IMPRESSION: 1. Calcification of the left renal pelvis could reflect, possible urolithiasis versus vascular calcium. No calcifications along the course of the ureters. 2. No high-grade obstructive bowel gas pattern. Electronically Signed   By: Lovena Le M.D.   On: 11/25/2019 22:05   CT Head Wo Contrast  Result Date: 11/25/2019 CLINICAL DATA:  Patient was found down. EXAM: CT HEAD WITHOUT CONTRAST CT CERVICAL SPINE WITHOUT CONTRAST TECHNIQUE: Multidetector CT imaging of the head and cervical spine was performed following the standard protocol without intravenous contrast. Multiplanar CT image reconstructions of the cervical spine were also generated. COMPARISON:  CT scan dated 01/21/2019 FINDINGS: CT HEAD FINDINGS Brain: No evidence of acute infarction, hemorrhage, hydrocephalus, extra-axial collection or mass lesion/mass effect. There appears to be an old small left cerebellar hemisphere infarct, unchanged. Vascular: No hyperdense vessel or unexpected calcification. Skull: Normal. Negative for fracture or focal lesion. Sinuses/Orbits: No acute finding. Other: None CT CERVICAL SPINE FINDINGS Alignment: Straightening of the cervical lordosis. Skull base and vertebrae: No  acute fracture. No primary bone lesion. Ankylosis of the C3 and C4 vertebra and left lateral masses. Soft tissues and spinal canal: Hypertrophy of the transverse ligament at C1-2. The spinal cord is not compressed at that level. No prevertebral soft tissue swelling visible canal hematoma. Disc levels: C2-3: No significant disc bulging. Moderate left facet arthritis. No foraminal stenosis. C3-4: No disc bulging or protrusion. The vertebra and left lateral masses are fused. No foraminal stenosis. C4-5: Disc space narrowing. Osteophytes protrude into both neural foramina without significant  foraminal stenosis. C5-6: Disc space narrowing with prominent degenerative changes of the vertebral endplates. Broad-based disc osteophyte complex slightly narrows the AP dimension of the spinal canal. Uncinate spurs extend into both neural foramina with moderate bilateral foraminal stenosis. C6-7: Chronic disc space narrowing. Broad-based disc osteophyte complex with moderate left foraminal stenosis. C7-T1: Moderate right facet arthritis. Otherwise negative. Upper chest: Emphysematous changes at the right lung apex. Other: None IMPRESSION: 1. No acute intracranial abnormality. 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 2. No acute abnormality of the cervical spine. Multilevel degenerative disc and joint disease. Electronically Signed   By: Lorriane Shire M.D.   On: 11/25/2019 15:28   CT Cervical Spine Wo Contrast  Result Date: 11/25/2019 CLINICAL DATA:  Patient was found down. EXAM: CT HEAD WITHOUT CONTRAST CT CERVICAL SPINE WITHOUT CONTRAST TECHNIQUE: Multidetector CT imaging of the head and cervical spine was performed following the standard protocol without intravenous contrast. Multiplanar CT image reconstructions of the cervical spine were also generated. COMPARISON:  CT scan dated 01/21/2019 FINDINGS: CT HEAD FINDINGS Brain: No evidence of acute infarction, hemorrhage, hydrocephalus, extra-axial collection or mass lesion/mass effect. There appears to be an old small left cerebellar hemisphere infarct, unchanged. Vascular: No hyperdense vessel or unexpected calcification. Skull: Normal. Negative for fracture or focal lesion. Sinuses/Orbits: No acute finding. Other: None CT CERVICAL SPINE FINDINGS Alignment: Straightening of the cervical lordosis. Skull base and vertebrae: No acute fracture. No primary bone lesion. Ankylosis of the C3 and C4 vertebra and left lateral masses. Soft tissues and spinal canal: Hypertrophy of the transverse ligament at C1-2. The spinal cord is not compressed at that level. No  prevertebral soft tissue swelling visible canal hematoma. Disc levels: C2-3: No significant disc bulging. Moderate left facet arthritis. No foraminal stenosis. C3-4: No disc bulging or protrusion. The vertebra and left lateral masses are fused. No foraminal stenosis. C4-5: Disc space narrowing. Osteophytes protrude into both neural foramina without significant foraminal stenosis. C5-6: Disc space narrowing with prominent degenerative changes of the vertebral endplates. Broad-based disc osteophyte complex slightly narrows the AP dimension of the spinal canal. Uncinate spurs extend into both neural foramina with moderate bilateral foraminal stenosis. C6-7: Chronic disc space narrowing. Broad-based disc osteophyte complex with moderate left foraminal stenosis. C7-T1: Moderate right facet arthritis. Otherwise negative. Upper chest: Emphysematous changes at the right lung apex. Other: None IMPRESSION: 1. No acute intracranial abnormality. 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 2. No acute abnormality of the cervical spine. Multilevel degenerative disc and joint disease. Electronically Signed   By: Lorriane Shire M.D.   On: 11/25/2019 15:28   MR Brain Wo Contrast (neuro protocol)  Result Date: 11/25/2019 CLINICAL DATA:  Encephalopathy and aphasia. Found down. EXAM: MRI HEAD WITHOUT CONTRAST TECHNIQUE: Multiplanar, multiecho pulse sequences of the brain and surrounding structures were obtained without intravenous contrast. COMPARISON:  Brain MRI 12/02/2016 FINDINGS: BRAIN: There is no acute infarct, acute hemorrhage or extra-axial collection. Mild white matter hyperintensity, most commonly due to chronic ischemic  microangiopathy, though not unexpected for age. There is generalized atrophy without lobar predilection. The midline structures are normal. VASCULAR: The major intracranial arterial and venous sinus flow voids are normal. Small amount of hemosiderin deposition in the right central sulcus. SKULL AND  UPPER CERVICAL SPINE: Calvarial bone marrow signal is normal. There is no skull base mass. The visualized upper cervical spine and soft tissues are normal. SINUSES/ORBITS: There are no fluid levels or advanced mucosal thickening. The mastoid air cells and middle ear cavities are free of fluid. The orbits are normal. IMPRESSION: 1. No acute intracranial abnormality. 2. Generalized atrophy and mild chronic small vessel disease. Electronically Signed   By: Ulyses Jarred M.D.   On: 11/25/2019 23:37   DG Chest Portable 1 View  Result Date: 11/25/2019 CLINICAL DATA:  Altered mental status, history lymphoma, coronary artery disease, diabetes mellitus, CHF, hypertension, former smoker EXAM: PORTABLE CHEST 1 VIEW COMPARISON:  Portable exam 1509 hours compared 07/14/2019 FINDINGS: Upper normal heart size. Mediastinal contours and pulmonary vascularity normal. Atherosclerotic calcification aorta. Lungs clear. No infiltrate, pleural effusion or pneumothorax. No acute osseous findings. IMPRESSION: No acute abnormalities. Aortic Atherosclerosis (ICD10-I70.0). Electronically Signed   By: Lavonia Dana M.D.   On: 11/25/2019 15:26        Scheduled Meds: . insulin aspart  0-9 Units Subcutaneous Q4H  . insulin glargine  10 Units Subcutaneous QHS  . ipratropium-albuterol  3 mL Nebulization Once   Continuous Infusions: . sodium chloride 75 mL/hr at 11/26/19 0101     LOS: 0 days    Time spent: 35 mins    Wyvonnia Dusky, MD [facility] Pager [phone]  If 7PM-7AM, please contact night-coverage [email or web address] Password [facility] 11/26/2019, 8:47 AM

## 2019-11-26 NOTE — ED Notes (Signed)
Rectal temp 100.4 and patient becoming more restless. Admitting provider notified. Orders received for morphine and tylenol.

## 2019-11-26 NOTE — ED Notes (Signed)
Patient incontinent of urine. Patient changed and repositioned.

## 2019-11-26 NOTE — Progress Notes (Signed)
SLP Cancellation Note  Patient Details Name: Brett [name] MRN: [id number] DOB: May 10, 1935   Cancelled treatment:       Reason Eval/Treat Not Completed: Patient not medically ready;Patient's level of consciousness;Medical issues which prohibited therapy(chart reviewed; consulted NSG then met w/ pt)  Pt is poorly responsive; speech incoherent. Pt responded to verbal/tactile stim w/ increased calling out, "NO" loudly. NSG reported pt given Haldol ~2 hours prior d/t combativeness and agitation/confusion.  Pt is not appropriate for BSE or any oral intake at this time; there is increased risk for aspiration d/t declined Cognitive status. Recommend continue NPO status w/ frequent oral care for hygiene and stimulation of swallowing. ST services will f/u tomorrow. NSG updated.      [name], [place], CCC-SLP [name] 11/26/2019, 3:28 PM

## 2019-11-26 NOTE — ED Notes (Signed)
Admitting md at the bedside.  

## 2019-11-26 NOTE — ED Notes (Signed)
Pt was incontinent of urine, brief changed an a rectal temp obtained, pt remains confused, unable to understand pt verbally, words garbled and slurred. Pt is tachycardiac and tachyepnic. Some bruising to the left lower back and buttocks that appears to be healing with purple center and yellowing edges.

## 2019-11-26 NOTE — ED Notes (Signed)
Date and time results received: 11/26/19 01:28 (use smartphrase ".now" to insert current time)  Test: osmolality Critical Value: 324  Name of Provider Notified: Dr. Roel Cluck  Orders Received? Or Actions Taken?: acknowledged

## 2019-11-26 NOTE — ED Notes (Signed)
Pt holding his head and calling out "help me". MD notified and no new orders at this time.

## 2019-11-27 ENCOUNTER — Inpatient Hospital Stay: Payer: Medicare PPO

## 2019-11-27 DIAGNOSIS — R778 Other specified abnormalities of plasma proteins: Secondary | ICD-10-CM

## 2019-11-27 DIAGNOSIS — R4182 Altered mental status, unspecified: Secondary | ICD-10-CM

## 2019-11-27 DIAGNOSIS — J690 Pneumonitis due to inhalation of food and vomit: Secondary | ICD-10-CM

## 2019-11-27 LAB — VOLATILES,BLD-ACETONE,ETHANOL,ISOPROP,METHANOL
Acetone, blood: <0.01 g/dL (ref 0.000–0.010)
Ethanol, blood: <0.01 g/dL (ref 0.000–0.010)
Isopropanol, blood: <0.01 g/dL (ref 0.000–0.010)
Methanol, blood: <0.01 g/dL (ref 0.000–0.010)

## 2019-11-27 LAB — ECHOCARDIOGRAM COMPLETE
Height: 70 in
Weight: 3209.9 oz

## 2019-11-27 LAB — CBC
HCT: 31.4 % — ABNORMAL LOW (ref 39.0–52.0)
Hemoglobin: 10.4 g/dL — ABNORMAL LOW (ref 13.0–17.0)
MCH: 27.5 pg (ref 26.0–34.0)
MCHC: 33.1 g/dL (ref 30.0–36.0)
MCV: 83.1 fL (ref 80.0–100.0)
Platelets: 161 10*3/uL (ref 150–400)
RBC: 3.78 MIL/uL — ABNORMAL LOW (ref 4.22–5.81)
RDW: 14.7 % (ref 11.5–15.5)
WBC: 7.1 10*3/uL (ref 4.0–10.5)
nRBC: 0 % (ref 0.0–0.2)

## 2019-11-27 LAB — COMPREHENSIVE METABOLIC PANEL
ALT: 54 U/L — ABNORMAL HIGH (ref 0–44)
AST: 46 U/L — ABNORMAL HIGH (ref 15–41)
Albumin: 3.2 g/dL — ABNORMAL LOW (ref 3.5–5.0)
Alkaline Phosphatase: 98 U/L (ref 38–126)
Anion gap: 11 (ref 5–15)
BUN: 19 mg/dL (ref 8–23)
CO2: 24 mmol/L (ref 22–32)
Calcium: 8.2 mg/dL — ABNORMAL LOW (ref 8.9–10.3)
Chloride: 104 mmol/L (ref 98–111)
Creatinine, Ser: 1.62 mg/dL — ABNORMAL HIGH (ref 0.61–1.24)
GFR calc Af Amer: 45 mL/min — ABNORMAL LOW (ref >60)
GFR calc non Af Amer: 39 mL/min — ABNORMAL LOW (ref >60)
Glucose, Bld: 300 mg/dL — ABNORMAL HIGH (ref 70–99)
Potassium: 3.9 mmol/L (ref 3.5–5.1)
Sodium: 139 mmol/L (ref 135–145)
Total Bilirubin: 0.7 mg/dL (ref 0.3–1.2)
Total Protein: 6.2 g/dL — ABNORMAL LOW (ref 6.5–8.1)

## 2019-11-27 LAB — URINE CULTURE: Culture: <10000 — AB

## 2019-11-27 LAB — HEMOGLOBIN A1C
Hgb A1c MFr Bld: 12.4 % — ABNORMAL HIGH (ref 4.8–5.6)
Mean Plasma Glucose: 309.18 mg/dL

## 2019-11-27 LAB — ETHYLENE GLYCOL: Ethylene Glycol Lvl: <5 mg/dL

## 2019-11-27 LAB — GLUCOSE, CAPILLARY
Glucose-Capillary: 259 mg/dL — ABNORMAL HIGH (ref 70–99)
Glucose-Capillary: 239 mg/dL — ABNORMAL HIGH (ref 70–99)
Glucose-Capillary: 261 mg/dL — ABNORMAL HIGH (ref 70–99)
Glucose-Capillary: 290 mg/dL — ABNORMAL HIGH (ref 70–99)
Glucose-Capillary: 335 mg/dL — ABNORMAL HIGH (ref 70–99)
Glucose-Capillary: 300 mg/dL — ABNORMAL HIGH (ref 70–99)
Glucose-Capillary: 216 mg/dL — ABNORMAL HIGH (ref 70–99)
Glucose-Capillary: 231 mg/dL — ABNORMAL HIGH (ref 70–99)

## 2019-11-27 LAB — TROPONIN I (HIGH SENSITIVITY)
Troponin I (High Sensitivity): 108 ng/L (ref <18)
Troponin I (High Sensitivity): 125 ng/L (ref <18)

## 2019-11-27 LAB — STREP PNEUMONIAE URINARY ANTIGEN: Strep Pneumo Urinary Antigen: NEGATIVE

## 2019-11-27 MED ORDER — LABETALOL HCL 5 MG/ML IV SOLN
INTRAVENOUS | Status: AC
  Filled 2019-11-27: qty 4

## 2019-11-27 MED ORDER — ACETAMINOPHEN 650 MG RE SUPP
RECTAL | Status: AC
  Administered 2019-11-27: 650 mg via RECTAL
  Filled 2019-11-27: qty 1

## 2019-11-27 MED ORDER — LABETALOL HCL 5 MG/ML IV SOLN
INTRAVENOUS | Status: AC
  Administered 2019-11-27: 10 mg via INTRAVENOUS
  Filled 2019-11-27: qty 4

## 2019-11-27 MED ORDER — INSULIN ASPART 100 UNIT/ML ~~LOC~~ SOLN
SUBCUTANEOUS | Status: AC
  Administered 2019-11-27: 3 [IU] via SUBCUTANEOUS
  Filled 2019-11-27: qty 1

## 2019-11-27 MED ORDER — ACETAMINOPHEN 650 MG RE SUPP
RECTAL | Status: AC
  Filled 2019-11-27: qty 1

## 2019-11-27 MED ORDER — FUROSEMIDE 10 MG/ML IJ SOLN
40.0000 mg | Freq: Once | INTRAMUSCULAR | Status: AC
  Administered 2019-11-27: 40 mg via INTRAVENOUS
  Filled 2019-11-27: qty 4

## 2019-11-27 MED ORDER — HALOPERIDOL LACTATE 5 MG/ML IJ SOLN
INTRAMUSCULAR | Status: AC
  Administered 2019-11-27: 5 mg via INTRAMUSCULAR
  Filled 2019-11-27: qty 1

## 2019-11-27 MED ORDER — CHLORHEXIDINE GLUCONATE CLOTH 2 % EX PADS
6.0000 | MEDICATED_PAD | Freq: Every day | CUTANEOUS | Status: DC
  Administered 2019-11-27: 6 via TOPICAL

## 2019-11-27 MED ORDER — LORAZEPAM 2 MG/ML IJ SOLN
0.5000 mg | Freq: Once | INTRAMUSCULAR | Status: AC
  Administered 2019-11-27: 0.5 mg via INTRAVENOUS
  Filled 2019-11-27: qty 1

## 2019-11-27 MED ORDER — INSULIN ASPART 100 UNIT/ML ~~LOC~~ SOLN
SUBCUTANEOUS | Status: AC
  Filled 2019-11-27: qty 1

## 2019-11-27 MED ORDER — IPRATROPIUM-ALBUTEROL 0.5-2.5 (3) MG/3ML IN SOLN
3.0000 mL | Freq: Four times a day (QID) | RESPIRATORY_TRACT | Status: DC
  Administered 2019-11-27 – 2019-11-29 (×8): 3 mL via RESPIRATORY_TRACT
  Filled 2019-11-27 (×10): qty 3

## 2019-11-27 MED ORDER — IPRATROPIUM-ALBUTEROL 0.5-2.5 (3) MG/3ML IN SOLN
RESPIRATORY_TRACT | Status: AC
  Administered 2019-11-27: 3 mL via RESPIRATORY_TRACT
  Filled 2019-11-27: qty 3

## 2019-11-27 MED ORDER — ALBUTEROL SULFATE (2.5 MG/3ML) 0.083% IN NEBU: 2.5000 mg | INHALATION_SOLUTION | Freq: Four times a day (QID) | RESPIRATORY_TRACT | Status: DC | PRN

## 2019-11-27 MED ORDER — ORAL CARE MOUTH RINSE
15.0000 mL | Freq: Two times a day (BID) | OROMUCOSAL | Status: DC
  Administered 2019-11-27 – 2019-12-05 (×11): 15 mL via OROMUCOSAL

## 2019-11-27 MED ORDER — INSULIN ASPART 100 UNIT/ML ~~LOC~~ SOLN
0.0000 [IU] | SUBCUTANEOUS | Status: DC
  Administered 2019-11-27 (×2): 5 [IU] via SUBCUTANEOUS
  Administered 2019-11-27: 8 [IU] via SUBCUTANEOUS
  Administered 2019-11-28 (×3): 5 [IU] via SUBCUTANEOUS
  Administered 2019-11-28: 3 [IU] via SUBCUTANEOUS
  Administered 2019-11-28: 5 [IU] via SUBCUTANEOUS
  Administered 2019-11-28 – 2019-11-29 (×5): 3 [IU] via SUBCUTANEOUS
  Administered 2019-11-29: 18:00:00 5 [IU] via SUBCUTANEOUS
  Administered 2019-11-29: 8 [IU] via SUBCUTANEOUS
  Administered 2019-11-30: 3 [IU] via SUBCUTANEOUS
  Administered 2019-11-30: 8 [IU] via SUBCUTANEOUS
  Administered 2019-11-30: 13:00:00 3 [IU] via SUBCUTANEOUS
  Administered 2019-11-30: 18:00:00 15 [IU] via SUBCUTANEOUS
  Administered 2019-11-30: 05:00:00 8 [IU] via SUBCUTANEOUS
  Filled 2019-11-27 (×15): qty 1

## 2019-11-27 MED ORDER — ENOXAPARIN SODIUM 40 MG/0.4ML ~~LOC~~ SOLN
40.0000 mg | SUBCUTANEOUS | Status: DC
  Administered 2019-11-27 – 2019-12-02 (×6): 40 mg via SUBCUTANEOUS
  Filled 2019-11-27 (×6): qty 0.4

## 2019-11-27 MED ORDER — LABETALOL HCL 5 MG/ML IV SOLN
10.0000 mg | INTRAVENOUS | Status: DC | PRN
  Administered 2019-11-27 – 2019-11-29 (×3): 10 mg via INTRAVENOUS
  Filled 2019-11-27 (×2): qty 4

## 2019-11-27 MED ORDER — LORAZEPAM 2 MG/ML IJ SOLN
1.0000 mg | Freq: Four times a day (QID) | INTRAMUSCULAR | Status: DC | PRN
  Administered 2019-11-27 – 2019-11-29 (×4): 1 mg via INTRAVENOUS
  Filled 2019-11-27 (×4): qty 1

## 2019-11-27 MED ORDER — INSULIN ASPART 100 UNIT/ML ~~LOC~~ SOLN
SUBCUTANEOUS | Status: AC
  Administered 2019-11-27: 5 [IU] via SUBCUTANEOUS
  Filled 2019-11-27: qty 1

## 2019-11-27 NOTE — Consult Note (Signed)
Reason for Consult:AMS Referring Physician: Jimmye Norman  CC: AMS  HPI: Brett [name] is an 84 y.o. male who due to his mental status is unable to provide any history therefore all history obtained from the chart.  Patient with past medical history as below including CAD, CHF, diabetes, COPD with frequent admissions for pneumonia, who presented on 12/27 with altered mental status.  Per report, the patient was found down in his house, confused, making nonsensical speech by a neighbor, who heard him banging on the wall.  LKW was possibly 2100 on 12/26.  Patient has remained confused since admission.      Past Medical History:  Diagnosis Date  . Anemia   . Aortic regurgitation   . B-cell lymphoma ([place]) 2009   DX AT [facility]  . Bronchitis   . CAD (coronary artery disease)   . Carotid stenosis   . CHF (congestive heart failure) ([place])   . Diabetes mellitus without complication ([place])   . Diabetes mellitus, type 2 ([place])   . Emphysema of lung ([place])   . Essential hypertension   . History of chemotherapy   . Hyperlipidemia   . Hypertension   . IDA (iron deficiency anemia)   . Leg edema   . Meralgia paraesthetica   . Mitral regurgitation   . PUD (peptic ulcer disease)   . PVD (peripheral vascular disease) ([place])   . Tobacco abuse     Past Surgical History:  Procedure Laterality Date  . CARDIAC CATHETERIZATION  08/15/2007  . CARDIAC CATHETERIZATION Right 07/13/2016   Procedure: Right/Left Heart Cath and Coronary Angiography;  Surgeon: Dionisio David, MD;  Location: [facility];  Service: Cardiovascular;  Laterality: Right;  . COLONOSCOPY  03/2013  . ESOPHAGOGASTRODUODENOSCOPY  03/2013    Family History  Problem Relation Age of Onset  . CAD Mother   . Rheum arthritis Neg Hx   . Osteoarthritis Neg Hx   . Asthma Neg Hx   . Diabetes Neg Hx   . Cancer Neg Hx     Social History:  reports that he quit smoking about 4 years ago. His smoking use included cigarettes. He has never used  smokeless tobacco. He reports that he does not drink alcohol or use drugs.  No Known Allergies  Medications:  I have reviewed the patient's current medications. Prior to Admission:  Medications Prior to Admission  Medication Sig Dispense Refill Last Dose  . amiodarone (PACERONE) 200 MG tablet Take 1 tablet (200 mg total) by mouth daily. 60 tablet 0 Past Week at Unknown time  . amLODipine (NORVASC) 5 MG tablet Take 5 mg by mouth daily.    Past Week at Unknown time  . ammonium lactate (LAC-HYDRIN) 12 % lotion Apply topically 2 (two) times daily. 400 g 0 Past Week at Unknown time  . clopidogrel (PLAVIX) 75 MG tablet Take 75 mg by mouth daily.    Past Week at Unknown time  . cyanocobalamin (,VITAMIN B-12,) 1000 MCG/ML injection Inject 1,000 mcg into the muscle every 30 (thirty) days.   Past Month at Unknown time  . ferrous sulfate 325 (65 FE) MG tablet Take 1 tablet (325 mg total) by mouth 2 (two) times daily with a meal. 30 tablet 3 Past Week at Unknown time  . furosemide (LASIX) 20 MG tablet Take 20 mg by mouth daily.   Past Week at Unknown time  . gabapentin (NEURONTIN) 100 MG capsule Take 1 capsule by mouth 2 (two) times daily.    Past  Week at Unknown time  . glimepiride (AMARYL) 2 MG tablet Take 1 tablet (2 mg total) by mouth daily with breakfast. (Patient taking differently: Take 4 mg by mouth daily with breakfast. ) 30 tablet 0 Past Week at Unknown time  . hydrALAZINE (APRESOLINE) 100 MG tablet Take 100 mg by mouth 2 (two) times daily.   Past Week at Unknown time  . ipratropium-albuterol (DUONEB) 0.5-2.5 (3) MG/3ML SOLN J44.1  40ml nebulizer every six hours as needed for shortness of breath 360 mL 0 Past Week at Unknown time  . isosorbide mononitrate (IMDUR) 30 MG 24 hr tablet Take 30 mg by mouth daily.   Past Week at Unknown time  . metoprolol (TOPROL-XL) 200 MG 24 hr tablet Take 1 tablet (200 mg total) by mouth daily. 30 tablet 0 Past Week at Unknown time  . mometasone-formoterol (DULERA)  100-5 MCG/ACT AERO Inhale 2 puffs into the lungs 2 (two) times daily. 13 g 0 Past Week at Unknown time  . pantoprazole (PROTONIX) 40 MG tablet Take 40 mg by mouth daily.   Past Week at Unknown time  . rosuvastatin (CRESTOR) 40 MG tablet Take 40 mg by mouth daily.   Past Week at Unknown time  . terbinafine (LAMISIL) 1 % cream Apply twice a day to feet (okay to substitute any anti fungal cream that is the cheapest) 30 g 0 Past Week at Unknown time  . Vitamin D, Ergocalciferol, (DRISDOL) 1.25 MG (50000 UT) CAPS capsule Take 50,000 Units by mouth once a week.   Past Week at Unknown time  . albuterol (PROVENTIL HFA;VENTOLIN HFA) 108 (90 Base) MCG/ACT inhaler Inhale 2 puffs into the lungs every 6 (six) hours as needed for wheezing or shortness of breath. 1 Inhaler 0 prn at prn  . tiotropium (SPIRIVA HANDIHALER) 18 MCG inhalation capsule Place 1 capsule (18 mcg total) into inhaler and inhale daily. (Patient not taking: Reported on 11/25/2019) 30 capsule 0 Not Taking at Unknown time   Scheduled: .  stroke: mapping our early stages of recovery book   Does not apply Once  . Chlorhexidine Gluconate Cloth  6 each Topical Daily  . insulin aspart  0-15 Units Subcutaneous Q4H  . insulin glargine  10 Units Subcutaneous QHS  . ipratropium-albuterol  3 mL Nebulization Once  . ipratropium-albuterol  3 mL Nebulization Q6H    ROS: Unable to provide due to mental status  Physical Examination: Blood pressure (!) 151/76, pulse (!) 105, temperature 98.6 F (37 C), temperature source Axillary, resp. rate (!) 30, height 5\' 8"  (1.727 m), weight 93.2 kg, SpO2 97 %.  HEENT-  Normocephalic, no lesions, without obvious abnormality.  Normal external eye and conjunctiva.  Normal TM's bilaterally.  Normal auditory canals and external ears. Normal external nose, mucus membranes and septum.  Normal pharynx. Cardiovascular- S1, S2 normal, pulses palpable throughout   Lungs- chest clear, no wheezing, rales, normal symmetric air  entry Abdomen- soft, non-tender; bowel sounds normal; no masses,  no organomegaly Extremities- mild LE edema Lymph-no adenopathy palpable Musculoskeletal-no joint tenderness, deformity or swelling Skin-warm and dry, no hyperpigmentation, vitiligo, or suspicious lesions  Neurological Examination   Mental Status: Awake, uncooperative.  Speech minimal but fluent.  Does not follow commands. Stares straight ahead Cranial Nerves: II: Pupils equal, round, reactive to light and accommodation III,IV, VI: ptosis not present, extra-ocular motions intact bilaterally V,VII: face symmetric VIII: unable to test IX,X: unable to test XI: unable to test XII: unable to test Motor: Moves all extremities spontaneously Sensory:  Responds to noxious stimuli bilaterally Deep Tendon Reflexes: Symmetric throughout Plantars: Unable to test due to cooperation Cerebellar: unable to test Gait: not tested due to safety concerns   Laboratory Studies:   Basic Metabolic Panel: Recent Labs  Lab 11/25/19 1450 11/25/19 2204 11/26/19 0419 11/26/19 1728 11/27/19 0157  NA 134* 142 142 137 139  K 5.5* 3.8 4.1 4.5 3.9  CL 100 105 105 103 104  CO2 19* 24 25 24 24   GLUCOSE 668* 238* 257* 341* 300*  BUN 27* 21 21 18 19   CREATININE 2.02* 1.69* 1.54* 1.53* 1.62*  CALCIUM 9.1 9.2 8.8* 8.4* 8.2*    Liver Function Tests: Recent Labs  Lab 11/25/19 1450 11/26/19 1728 11/27/19 0157  AST 62* 48* 46*  ALT 77* 61* 54*  ALKPHOS 119 102 98  BILITOT 0.7 0.8 0.7  PROT 6.7 6.2* 6.2*  ALBUMIN 3.6 3.2* 3.2*   No results for input(s): LIPASE, AMYLASE in the last 168 hours. Recent Labs  Lab 11/26/19 0058  AMMONIA 26    CBC: Recent Labs  Lab 11/25/19 1450 11/26/19 1728 11/27/19 0157  WBC 6.2 8.3 7.1  NEUTROABS 4.6  --   --   HGB 10.7* 10.9* 10.4*  HCT 32.1* 33.3* 31.4*  MCV 83.4 83.5 83.1  PLT 163 156 161    Cardiac Enzymes: Recent Labs  Lab 11/25/19 1450  CKTOTAL 164    BNP: Invalid input(s):  POCBNP  CBG: Recent Labs  Lab 11/26/19 0355 11/26/19 0943 11/26/19 1241 11/26/19 1645 11/27/19 1158  GLUCAP 219* 330* 317* 317* 259*    Microbiology: Results for orders placed or performed during the hospital encounter of 11/25/19  Urine Culture     Status: Abnormal   Collection Time: 11/25/19  2:37 PM   Specimen: Urine, Random  Result Value Ref Range Status   Specimen Description   Final    URINE, RANDOM Performed at [facility], [address], [place], [place] [address]    Special Requests   Final    NONE Performed at [facility], [address] [place], [place] [address]    Culture (A)  Final    <10,000 COLONIES/mL INSIGNIFICANT GROWTH Performed at [facility], [place] [address] [place], [place] [address]    Report Status 11/27/2019 FINAL  Final  Respiratory Panel by RT PCR (Flu A&B, Covid) - Urine, Clean Catch     Status: None   Collection Time: 11/25/19  4:22 PM   Specimen: Urine, Clean Catch  Result Value Ref Range Status   SARS Coronavirus 2 by RT PCR NEGATIVE NEGATIVE Final    Comment: (NOTE) SARS-CoV-2 target nucleic acids are NOT DETECTED. The SARS-CoV-2 RNA is generally detectable in upper respiratoy specimens during the acute phase of infection. The lowest concentration of SARS-CoV-2 viral copies this assay can detect is 131 copies/mL. A negative result does not preclude SARS-Cov-2 infection and should not be used as the sole basis for treatment or other patient management decisions. A negative result may occur with  improper specimen collection/handling, submission of specimen other than nasopharyngeal swab, presence of viral mutation(s) within the areas targeted by this assay, and inadequate number of viral copies (<131 copies/mL). A negative result must be combined with clinical observations, patient history, and epidemiological information. The expected result is Negative. Fact Sheet for Patients:   PinkCheek.be Fact Sheet for Healthcare Providers:  GravelBags.it This test is not yet ap proved or cleared by the [place] and  has been authorized  for detection and/or diagnosis of SARS-CoV-2 by FDA under an Emergency Use Authorization (EUA). This EUA will remain  in effect (meaning this test can be used) for the duration of the COVID-19 declaration under Section 564(b)(1) of the Act, 21 U.S.C. section 360bbb-3(b)(1), unless the authorization is terminated or revoked sooner.    Influenza A by PCR NEGATIVE NEGATIVE Final   Influenza B by PCR NEGATIVE NEGATIVE Final    Comment: (NOTE) The Xpert Xpress SARS-CoV-2/FLU/RSV assay is intended as an aid in  the diagnosis of influenza from Nasopharyngeal swab specimens and  should not be used as a sole basis for treatment. Nasal washings and  aspirates are unacceptable for Xpert Xpress SARS-CoV-2/FLU/RSV  testing. Fact Sheet for Patients: PinkCheek.be Fact Sheet for Healthcare Providers: GravelBags.it This test is not yet approved or cleared by the [place] FDA and  has been authorized for detection and/or diagnosis of SARS-CoV-2 by  FDA under an Emergency Use Authorization (EUA). This EUA will remain  in effect (meaning this test can be used) for the duration of the  Covid-19 declaration under Section 564(b)(1) of the Act, 21  U.S.C. section 360bbb-3(b)(1), unless the authorization is  terminated or revoked. Performed at [facility], [address] [place], [place] [address]   CULTURE, BLOOD (ROUTINE X 2) w Reflex to ID Panel     Status: None (Preliminary result)   Collection Time: 11/26/19 10:51 AM   Specimen: BLOOD  Result Value Ref Range Status   Specimen Description BLOOD RIGHT ANTECUBITAL  Final   Special Requests   Final    BOTTLES DRAWN AEROBIC AND ANAEROBIC Blood Culture adequate volume    Culture   Final    NO GROWTH < 24 HOURS Performed at [facility], [address] [place], [place] [address]    Report Status PENDING  Incomplete  CULTURE, BLOOD (ROUTINE X 2) w Reflex to ID Panel     Status: None (Preliminary result)   Collection Time: 11/26/19 10:51 AM   Specimen: BLOOD  Result Value Ref Range Status   Specimen Description BLOOD BLOOD LEFT FOREARM  Final   Special Requests   Final    BOTTLES DRAWN AEROBIC AND ANAEROBIC Blood Culture adequate volume   Culture   Final    NO GROWTH < 24 HOURS Performed at [facility], [address] [place], [place] [address]    Report Status PENDING  Incomplete    Coagulation Studies: No results for input(s): LABPROT, INR in the last 72 hours.  Urinalysis:  Recent Labs  Lab 11/25/19 1437  COLORURINE STRAW*  LABSPEC 1.025  PHURINE 6.0  GLUCOSEU >=500*  HGBUR NEGATIVE  BILIRUBINUR NEGATIVE  KETONESUR NEGATIVE  PROTEINUR 100*  NITRITE NEGATIVE  LEUKOCYTESUR NEGATIVE    Lipid Panel:     Component Value Date/Time   CHOL 114 12/02/2016 0310   TRIG 187 (H) 12/02/2016 0310   HDL 22 (L) 12/02/2016 0310   CHOLHDL 5.2 12/02/2016 0310   VLDL 37 12/02/2016 0310   LDLCALC 55 12/02/2016 0310    HgbA1C:  Lab Results  Component Value Date   HGBA1C 7.2 (H) 05/23/2019    Urine Drug Screen:      Component Value Date/Time   LABOPIA NONE DETECTED 11/25/2019 1437   COCAINSCRNUR NONE DETECTED 11/25/2019 1437   LABBENZ NONE DETECTED 11/25/2019 1437   AMPHETMU NONE DETECTED 11/25/2019 1437   THCU NONE DETECTED 11/25/2019 1437   LABBARB NONE DETECTED 11/25/2019 1437    Alcohol Level:  Recent Labs  Lab 11/26/19 0419  ETH <10    Other results: EKG: sinus tachycardia at 106 bpm.  Imaging: CT ABDOMEN PELVIS WO CONTRAST  Result Date: 11/26/2019 CLINICAL DATA:  Abdominal distension with elevated lactate. EXAM: CT ABDOMEN AND PELVIS WITHOUT CONTRAST TECHNIQUE: Multidetector CT imaging of the  abdomen and pelvis was performed following the standard protocol without IV contrast. COMPARISON:  None. FINDINGS: LOWER CHEST: No basilar pleural or apical pericardial effusion. HEPATOBILIARY: Diffuse hypoattenuation of the liver relative to the spleen suggests hepatic steatosis. No focal liver lesion or biliary dilatation. There is focal fatty sparing along the gallbladder fossa and along the anterior hepatic edge. Normal gallbladder. PANCREAS: Normal pancreas. No ductal dilatation or peripancreatic fluid collection. SPLEEN: Normal. ADRENALS/URINARY TRACT: --Adrenal glands: Normal. --Kidneys and ureters: No hydronephrosis, nephroureterolithiasis or solid renal mass. Mild bilateral perinephric stranding, likely chronic. --Urinary bladder: Normal for degree of distention STOMACH/BOWEL: --Stomach/Duodenum: No hiatal hernia. Normal duodenal course and caliber. --Small bowel: No dilatation or inflammation. --Colon: No focal abnormality. --Appendix: Normal. VASCULAR/LYMPHATIC: There is calcific atherosclerosis of the abdominal aorta. No abdominal or pelvic lymphadenopathy. REPRODUCTIVE: Normal prostate size with symmetric seminal vesicles. MUSCULOSKELETAL. No bony spinal canal stenosis or focal osseous abnormality. OTHER: None. IMPRESSION: 1. No acute abdominal or pelvic abnormality. 2. Hepatic steatosis. 3. Aortic Atherosclerosis (ICD10-I70.0). Electronically Signed   By: Ulyses Jarred M.D.   On: 11/26/2019 00:26   DG Abd 1 View  Result Date: 11/25/2019 CLINICAL DATA:  Abdominal distension EXAM: ABDOMEN - 1 VIEW COMPARISON:  CT abdomen pelvis 05/13/2010 FINDINGS: No high-grade obstructive bowel gas pattern. No suspicious calcifications over the gallbladder fossa. Coarse calcification is seen in the region of the left renal pelvis. No calcifications seen along the course of either ureter. Vascular calcium noted in the pelvis. Degenerative changes in the spine and hips. Lung bases are clear. Included mediastinal  contours are unremarkable. IMPRESSION: 1. Calcification of the left renal pelvis could reflect, possible urolithiasis versus vascular calcium. No calcifications along the course of the ureters. 2. No high-grade obstructive bowel gas pattern. Electronically Signed   By: Lovena Le M.D.   On: 11/25/2019 22:05   CT Head Wo Contrast  Result Date: 11/25/2019 CLINICAL DATA:  Patient was found down. EXAM: CT HEAD WITHOUT CONTRAST CT CERVICAL SPINE WITHOUT CONTRAST TECHNIQUE: Multidetector CT imaging of the head and cervical spine was performed following the standard protocol without intravenous contrast. Multiplanar CT image reconstructions of the cervical spine were also generated. COMPARISON:  CT scan dated 01/21/2019 FINDINGS: CT HEAD FINDINGS Brain: No evidence of acute infarction, hemorrhage, hydrocephalus, extra-axial collection or mass lesion/mass effect. There appears to be an old small left cerebellar hemisphere infarct, unchanged. Vascular: No hyperdense vessel or unexpected calcification. Skull: Normal. Negative for fracture or focal lesion. Sinuses/Orbits: No acute finding. Other: None CT CERVICAL SPINE FINDINGS Alignment: Straightening of the cervical lordosis. Skull base and vertebrae: No acute fracture. No primary bone lesion. Ankylosis of the C3 and C4 vertebra and left lateral masses. Soft tissues and spinal canal: Hypertrophy of the transverse ligament at C1-2. The spinal cord is not compressed at that level. No prevertebral soft tissue swelling visible canal hematoma. Disc levels: C2-3: No significant disc bulging. Moderate left facet arthritis. No foraminal stenosis. C3-4: No disc bulging or protrusion. The vertebra and left lateral masses are fused. No foraminal stenosis. C4-5: Disc space narrowing. Osteophytes protrude into both neural foramina without significant foraminal stenosis. C5-6: Disc space narrowing with prominent degenerative changes of the vertebral endplates.  Broad-based disc  osteophyte complex slightly narrows the AP dimension of the spinal canal. Uncinate spurs extend into both neural foramina with moderate bilateral foraminal stenosis. C6-7: Chronic disc space narrowing. Broad-based disc osteophyte complex with moderate left foraminal stenosis. C7-T1: Moderate right facet arthritis. Otherwise negative. Upper chest: Emphysematous changes at the right lung apex. Other: None IMPRESSION: 1. No acute intracranial abnormality. 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 2. No acute abnormality of the cervical spine. Multilevel degenerative disc and joint disease. Electronically Signed   By: Lorriane Shire M.D.   On: 11/25/2019 15:28   CT Cervical Spine Wo Contrast  Result Date: 11/25/2019 CLINICAL DATA:  Patient was found down. EXAM: CT HEAD WITHOUT CONTRAST CT CERVICAL SPINE WITHOUT CONTRAST TECHNIQUE: Multidetector CT imaging of the head and cervical spine was performed following the standard protocol without intravenous contrast. Multiplanar CT image reconstructions of the cervical spine were also generated. COMPARISON:  CT scan dated 01/21/2019 FINDINGS: CT HEAD FINDINGS Brain: No evidence of acute infarction, hemorrhage, hydrocephalus, extra-axial collection or mass lesion/mass effect. There appears to be an old small left cerebellar hemisphere infarct, unchanged. Vascular: No hyperdense vessel or unexpected calcification. Skull: Normal. Negative for fracture or focal lesion. Sinuses/Orbits: No acute finding. Other: None CT CERVICAL SPINE FINDINGS Alignment: Straightening of the cervical lordosis. Skull base and vertebrae: No acute fracture. No primary bone lesion. Ankylosis of the C3 and C4 vertebra and left lateral masses. Soft tissues and spinal canal: Hypertrophy of the transverse ligament at C1-2. The spinal cord is not compressed at that level. No prevertebral soft tissue swelling visible canal hematoma. Disc levels: C2-3: No significant disc bulging. Moderate left facet  arthritis. No foraminal stenosis. C3-4: No disc bulging or protrusion. The vertebra and left lateral masses are fused. No foraminal stenosis. C4-5: Disc space narrowing. Osteophytes protrude into both neural foramina without significant foraminal stenosis. C5-6: Disc space narrowing with prominent degenerative changes of the vertebral endplates. Broad-based disc osteophyte complex slightly narrows the AP dimension of the spinal canal. Uncinate spurs extend into both neural foramina with moderate bilateral foraminal stenosis. C6-7: Chronic disc space narrowing. Broad-based disc osteophyte complex with moderate left foraminal stenosis. C7-T1: Moderate right facet arthritis. Otherwise negative. Upper chest: Emphysematous changes at the right lung apex. Other: None IMPRESSION: 1. No acute intracranial abnormality. 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 2. No acute abnormality of the cervical spine. Multilevel degenerative disc and joint disease. Electronically Signed   By: Lorriane Shire M.D.   On: 11/25/2019 15:28   MR Brain Wo Contrast (neuro protocol)  Result Date: 11/25/2019 CLINICAL DATA:  Encephalopathy and aphasia. Found down. EXAM: MRI HEAD WITHOUT CONTRAST TECHNIQUE: Multiplanar, multiecho pulse sequences of the brain and surrounding structures were obtained without intravenous contrast. COMPARISON:  Brain MRI 12/02/2016 FINDINGS: BRAIN: There is no acute infarct, acute hemorrhage or extra-axial collection. Mild white matter hyperintensity, most commonly due to chronic ischemic microangiopathy, though not unexpected for age. There is generalized atrophy without lobar predilection. The midline structures are normal. VASCULAR: The major intracranial arterial and venous sinus flow voids are normal. Small amount of hemosiderin deposition in the right central sulcus. SKULL AND UPPER CERVICAL SPINE: Calvarial bone marrow signal is normal. There is no skull base mass. The visualized upper cervical spine  and soft tissues are normal. SINUSES/ORBITS: There are no fluid levels or advanced mucosal thickening. The mastoid air cells and middle ear cavities are free of fluid. The orbits are normal. IMPRESSION: 1. No acute intracranial abnormality. 2. Generalized atrophy  and mild chronic small vessel disease. Electronically Signed   By: Ulyses Jarred M.D.   On: 11/25/2019 23:37   US Carotid Bilateral (at [facility] and [facility] only)  Result Date: 11/26/2019 CLINICAL DATA:  TIA. History of CABG, hypertension, hyperlipidemia and diabetes. Former smoker. EXAM: BILATERAL CAROTID DUPLEX ULTRASOUND TECHNIQUE: Pearline Cables scale imaging, color Doppler and duplex ultrasound were performed of bilateral carotid and vertebral arteries in the neck. COMPARISON:  Carotid Doppler ultrasound-12/02/2016 FINDINGS: Examination is degraded secondary to patient's inability to tolerate positioning necessary for the ultrasound. Criteria: Quantification of carotid stenosis is based on velocity parameters that correlate the residual internal carotid diameter with NASCET-based stenosis levels, using the diameter of the distal internal carotid lumen as the denominator for stenosis measurement. The following velocity measurements were obtained: RIGHT ICA: 71/20 cm/sec CCA: 65/4 cm/sec SYSTOLIC ICA/CCA RATIO:  1.5 ECA: 306 cm/sec LEFT ICA: 78/20 cm/sec CCA: 65/03 cm/sec SYSTOLIC ICA/CCA RATIO:  0.8 ECA: 105 cm/sec RIGHT CAROTID ARTERY: There is a moderate amount of eccentric mixed echogenic plaque within the right carotid bulb (image 24), extending to involve the origin and proximal aspects of the right internal carotid artery (image 33, morphologically similar to the 11/2016 examination, and again not resulting in elevated peak systolic velocities within the interrogated course of the right internal carotid artery to suggest a hemodynamically significant stenosis. RIGHT VERTEBRAL ARTERY:  Antegrade Flow LEFT CAROTID ARTERY: There is a minimal amount of intimal  thickening/atherosclerotic plaque involving the mid and distal aspects of the left common carotid artery as well as the left carotid bulb, morphologically similar to the 11/2016 examination, and again not resulting in elevated peak systolic velocities within the interrogated course of the left internal carotid artery to suggest a hemodynamically significant stenosis. LEFT VERTEBRAL ARTERY:  Antegrade flow IMPRESSION: Minimal to moderate amount of bilateral atherosclerotic plaque, right greater than left, morphologically similar to the 11/2016 examination, and again not resulting in elevated peak systolic velocities within either internal carotid artery to suggest a hemodynamically significant stenosis. Electronically Signed   By: Sandi Mariscal M.D.   On: 11/26/2019 16:15   DG Chest Port 1 View  Result Date: 11/27/2019 CLINICAL DATA:  Tachypnea EXAM: PORTABLE CHEST 1 VIEW COMPARISON:  Chest radiograph 11/26/2019 FINDINGS: Mild cardiomegaly, unchanged. Aortic atherosclerosis. Bilateral interstitial prominence is similar to prior examination. The left lateral costophrenic angle is incompletely included in the field of view, but there is suggestion of a small left pleural effusion. No evidence of pneumothorax. No acute bony abnormality. Overlying cardiac monitoring leads. IMPRESSION: Bilateral interstitial prominence may reflect interstitial edema. Atypical/viral pneumonia cannot be excluded. A small left pleural effusion is questioned. Stable mild cardiomegaly.  Aortic atherosclerosis. Electronically Signed   By: Kellie Simmering DO   On: 11/27/2019 09:28   DG Chest Port 1 View  Result Date: 11/26/2019 CLINICAL DATA:  Altered mental status. EXAM: PORTABLE CHEST 1 VIEW COMPARISON:  11/25/2019 FINDINGS: Mild cardiac enlargement. No pleural effusion or edema. No airspace opacities identified. The visualized osseous structures are unremarkable. IMPRESSION: 1. No acute cardiopulmonary abnormalities. 2. Mild cardiac  enlargement. Electronically Signed   By: Kerby Moors M.D.   On: 11/26/2019 10:41   DG Chest Portable 1 View  Result Date: 11/25/2019 CLINICAL DATA:  Altered mental status, history lymphoma, coronary artery disease, diabetes mellitus, CHF, hypertension, former smoker EXAM: PORTABLE CHEST 1 VIEW COMPARISON:  Portable exam 1509 hours compared 07/14/2019 FINDINGS: Upper normal heart size. Mediastinal contours and pulmonary vascularity normal. Atherosclerotic calcification aorta. Lungs clear. No  infiltrate, pleural effusion or pneumothorax. No acute osseous findings. IMPRESSION: No acute abnormalities. Aortic Atherosclerosis (ICD10-I70.0). Electronically Signed   By: Lavonia Dana M.D.   On: 11/25/2019 15:26   ECHOCARDIOGRAM COMPLETE  Result Date: 11/27/2019   ECHOCARDIOGRAM REPORT   Patient Name:   Brett [name] Date of Exam: 11/26/2019 Medical Rec #:  [id number]         Height:       70.0 in Accession #:    [id number]        Weight:       200.6 lb Date of Birth:  July 26, 1935         BSA:          2.09 m Patient Age:    64 years          BP:           141/87 mmHg Patient Gender: M                 HR:           112 bpm. Exam Location:  [facility] Procedure: 2D Echo, Cardiac Doppler and Color Doppler Indications:     TIA 435.9  History:         Patient has prior history of Echocardiogram examinations. CAD;                  Risk Factors:Hypertension.  Sonographer:     Alyse Low Roar Referring Phys:  [place] Diagnosing Phys: Nelva Bush MD  Sonographer Comments: Technically difficult study due to poor echo windows. Image acquisition challenging due to patient behavioral factors. IMPRESSIONS  1. Left ventricular ejection fraction, by visual estimation, is 50 to 55%. The left ventricle has low normal function. There is mildly increased left ventricular hypertrophy.  2. Indeterminate diastolic filling due to E-A fusion.  3. Global right ventricle has low normal systolic function.The right ventricular  size is normal. No increase in right ventricular wall thickness.  4. Pulmonary artery pressure is likely upper normal to mildly elevated (PASP 30 mmHg plus central venous pressure).  5. Left atrial size was normal.  6. Right atrial size was normal.  7. The mitral valve is grossly normal. No evidence of mitral valve regurgitation. No evidence of mitral stenosis.  8. The tricuspid valve is normal in structure.  9. Aortic valve regurgitation is mild to moderate. 10. The aortic valve is tricuspid. Aortic valve regurgitation is mild to moderate. Mild aortic valve stenosis. 11. Aortic regurgitation appears mild by color Doppler, though spectral Doppler envelope suggests more significant regurgitation. 12. The pulmonic valve was not well visualized. Pulmonic valve regurgitation is not visualized. 13. The interatrial septum was not well visualized. FINDINGS  Left Ventricle: Left ventricular ejection fraction, by visual estimation, is 50 to 55%. The left ventricle has low normal function. The left ventricle is not well visualized. The left ventricular internal cavity size was the left ventricle is normal in size. There is mildly increased left ventricular hypertrophy. Indeterminate diastolic filling due to E-A fusion. Right Ventricle: The right ventricular size is normal. No increase in right ventricular wall thickness. Global RV systolic function is has low normal systolic function. Pulmonary artery pressure is likely upper normal to mildly elevated (PASP 30 mmHg plus central venous pressure). Left Atrium: Left atrial size was normal in size. Right Atrium: Right atrial size was normal in size Pericardium: There is no evidence of pericardial effusion. Mitral Valve: The mitral valve is grossly normal. No evidence of  mitral valve regurgitation. No evidence of mitral valve stenosis by observation. Tricuspid Valve: The tricuspid valve is normal in structure. Tricuspid valve regurgitation mild-moderate. Aortic Valve: The aortic  valve is tricuspid. . There is moderate thickening and mild calcification of the aortic valve. Aortic valve regurgitation is mild to moderate. Aortic regurgitation PHT measures 209 msec. Mild aortic stenosis is present. There is moderate thickening of the aortic valve. There is mild calcification of the aortic valve. Aortic valve mean gradient measures 17.5 mmHg. Aortic valve peak gradient measures 30.6 mmHg. Aortic valve area, by VTI measures 1.32 cm. Aortic regurgitation appears mild by color Doppler, though spectral Doppler envelope suggests more significant regurgitation. Pulmonic Valve: The pulmonic valve was not well visualized. Pulmonic valve regurgitation is not visualized. Pulmonic regurgitation is not visualized. No evidence of pulmonic stenosis. Aorta: The aortic root is normal in size and structure. Pulmonary Artery: The pulmonary artery is not well seen. Venous: The inferior vena cava was not well visualized. IAS/Shunts: The interatrial septum was not well visualized.  LEFT VENTRICLE PLAX 2D LVIDd:         4.89 cm  Diastology LVIDs:         3.96 cm  LV e' lateral:   5.00 cm/s LV PW:         1.24 cm  LV E/e' lateral: 25.2 LV IVS:        1.34 cm  LV e' medial:    4.90 cm/s LVOT diam:     2.10 cm  LV E/e' medial:  25.7 LV SV:         44 ml LV SV Index:   20.53 LVOT Area:     3.46 cm  RIGHT VENTRICLE RV S prime:     16.80 cm/s LEFT ATRIUM           Index       RIGHT ATRIUM           Index LA diam:      3.20 cm 1.53 cm/m  RA Area:     10.10 cm LA Vol (A2C): 60.6 ml 28.99 ml/m RA Volume:   18.90 ml  9.04 ml/m LA Vol (A4C): 15.5 ml 7.42 ml/m  AORTIC VALVE                    PULMONIC VALVE AV Area (Vmax):    1.43 cm     PV Vmax:        0.96 m/s AV Area (Vmean):   1.35 cm     PV Peak grad:   3.7 mmHg AV Area (VTI):     1.32 cm     RVOT Peak grad: 2 mmHg AV Vmax:           276.50 cm/s AV Vmean:          196.500 cm/s AV VTI:            0.421 m AV Peak Grad:      30.6 mmHg AV Mean Grad:      17.5 mmHg  LVOT Vmax:         114.00 cm/s LVOT Vmean:        76.700 cm/s LVOT VTI:          0.160 m LVOT/AV VTI ratio: 0.38 AI PHT:            209 msec  AORTA Ao Root diam: 2.70 cm MITRAL VALVE  TRICUSPID VALVE MV Area (PHT): 6.65 cm             TR Peak grad:   30.5 mmHg MV PHT:        33.06 msec           TR Vmax:        276.00 cm/s MV Decel Time: 114 msec MV E velocity: 126.00 cm/s 103 cm/s SHUNTS                                     Systemic VTI:  0.16 m                                     Systemic Diam: 2.10 cm  Nelva Bush MD Electronically signed by Nelva Bush MD Signature Date/Time: 11/27/2019/7:29:06 AM    Final      Assessment/Plan: 84 y.o. male with a history of multiple medical problems admitted with altered mental status.  Etiology unclear.  MRI of the brain reviewed and shows no acute changes.  Patient with multiple metabolic issues though including elevated LFT's, elevated creatinine, hyperkalemia, hyponatremia and elevated blood sugars.  Patient also febrile and started on antibiotics.  Mental status likely multifactorial in etiology and related to above issues.  Although these issues are slowly improving it would not be unusual for the patient's cognitive status to lag behind improvement in the lab work.  Will continue to follow for the need for evaluation for possible other causes.   Serum ammonia, TSH and white blood cell count are normal.    Recommendations: 1. EEG pending.    Alexis Goodell, MD Neurology [phone] 11/27/2019, 12:12 PM

## 2019-11-27 NOTE — Progress Notes (Signed)
Lab called with elevated troponin of 108. Hospitalist notified. EKG ordered.

## 2019-11-27 NOTE — Progress Notes (Signed)
Initial Nutrition Assessment  RD working remotely.  DOCUMENTATION CODES:   Obesity unspecified  INTERVENTION:  Will monitor for diet advancement. Patient will benefit from oral nutrition supplement once diet advanced.  NUTRITION DIAGNOSIS:   Inadequate oral intake related to inability to eat as evidenced by NPO status.  GOAL:   Patient will meet greater than or equal to 90% of their needs  MONITOR:   Diet advancement, PO intake, Labs, Weight trends, I & O's  REASON FOR ASSESSMENT:   Consult Malnutrition Eval  ASSESSMENT:   84 year old male with PMHx of HTN, HLD, DM, CAD, B-cell lymphoma, PUD, emphysema of lung, COPD, CHF admitted with acute metabolic encephalopathy, sepsis, acute hypoxic respiratory failure, AKI.   Patient unable to provide any history at this time. According to chart he presents from home where he lives alone. Patient currently NPO awaiting SLP evaluation once more appropriate. He is currently disoriented and unable to follow commands per chart.  Weight appears stable in chart. If current weight is accurate patient is 93.2 kg (205.47 lbs).  Medications reviewed and include: Novolog 0-15 units Q4hrs, Lantus 10 units QHS, azithromycin, ceftriaxone.  Labs reviewed: Creatinine 1.62.  Unable to determine if patient meets criteria for malnutrition at this time.  NUTRITION - FOCUSED PHYSICAL EXAM:  Unable to complete at this time.  Diet Order:   Diet Order            Diet NPO time specified  Diet effective now             EDUCATION NEEDS:   No education needs have been identified at this time  Skin:  Skin Assessment: Reviewed RN Assessment  Last BM:  11/26/2019 per chart  Height:   Ht Readings from Last 1 Encounters:  11/27/19 5\' 8"  (1.727 m)   Weight:   Wt Readings from Last 1 Encounters:  11/27/19 93.2 kg   Ideal Body Weight:  70 kg  BMI:  Body mass index is 31.24 kg/m.  Estimated Nutritional Needs:   Kcal:   2000-2200  Protein:  100-110 grams  Fluid:  1.8-2 L/day  Jacklynn Barnacle, MS, RD, LDN Office: [phone] Pager: [phone] After Hours/Weekend Pager: [phone]

## 2019-11-27 NOTE — Progress Notes (Addendum)
PROGRESS NOTE    Brett [name]  KDT:[id number] DOB: Jan 04, 1935 DOA: 11/25/2019 PCP: Perrin Maltese, MD      Assessment & Plan:   Active Problems:   B-cell lymphoma (HCC)   COPD (chronic obstructive pulmonary disease) (HCC)   Sepsis (HCC)   HTN (hypertension)   Diabetes (HCC)   CAD (coronary artery disease)   Chronic diastolic heart failure (HCC)   Stroke (HCC)   COPD exacerbation (HCC)   Type 2 diabetes mellitus with hyperosmolar nonketotic hyperglycemia (HCC)   AF (paroxysmal atrial fibrillation) (HCC)   Acute encephalopathy   High serum osmolar gap   Possibly acute metabolic encephalopathy: unchanged from day prior. Far from pt's baseline as per pt's MPOA, pt is normally alert & oriented & able to ambulate independently. CT brain & MRI brain showed no acute abnormalities. Ammonia is WNL. PT/OT consulted but unable to complete at this time. EEG pending. Neuro following and recs apprec. NPO. Speech following and recs apprec   Sepsis: etiology unclear. Blood & urine cxs pending. Tylenol prn for fevers. Will continue on IV azithromycin & ceftriaxone.  Possible pneumonia: possible atypical or gram positive. Strep, legionella, & mycoplasma ordered. Continue on IV ceftriaxone and azithromycin. Continue on supplemental oxygen and wean as tolerated. Nebs prn   Acute hypoxic respiratory failure: etiology unclear, possible CHF exacerbation and/or pneumonia. Repeat CXR shows edema and possible pneumonia. IV lasix x1 given. Continue on IV abxs. Continue on supplemental oxygen and wean as tolerated   Acute on chronic diastolic heart failure: given IV lasix x1 today. Will reassess tomorrow. Monitor I/Os.   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 Cr is trending up today. Will continue to monitor    B-cell lymphoma: chronic & currently in remission  COPD: continue nebs, incentive spirometry. CXR shows no acute abnormalities. VBG obtained no evidence of CO2 retention to explain  mental status changes.  HTN: IV hydralazine prn   CAD: chronic & stable. When able to tolerate p.o. resume home medications  DM2: poorly controlled. Continue on lantus and SSI w/ accuchecks   PAF: IV metoprolol prn for HR >130. Patient apparently not anticoagulation he has amiodarone with his list of medication & his MPOA was unaware the patient has atrial fibrillation.   Elevated lactic acid: resolved. Etiology unclear.   Abdominal distention: etiology unclear. KUB & CT abd/pelvis shows no acute abnormailities   Transaminitis: etiology unclear. Trending down today Will continue to monitor   Normocytic anemia: etiology unclear. No need for a transfusion at this time. Will continue to monitor   Right eye blindness: chronic as per pt's MPOA   DVT prophylaxis: lovenox Code Status: DNR Family Communication: pt's MPOA is pt's friend, Claudette Head, and I  answered all of his questions. Pt is estranged from his family who evidently lives in [place] and [name]  Disposition Plan:   Consultants:  Neuro   Procedures:    Antimicrobials: azithromycin, ceftriaxone    Subjective: Pt is only oriented to person and unable to answer questions appropriately still.   Objective: Vitals:   11/27/19 0409 11/27/19 0520 11/27/19 0554 11/27/19 0839  BP: (!) 158/89 (!) 166/79 131/64 (!) 144/73  Pulse: (!) 109 (!) 108 80 (!) 103  Resp: (!) 24 (!) 22 20 (!) 28  Temp: 99.5 F (37.5 C) 100.3 F (37.9 C) 99.3 F (37.4 C) 98.6 F (37 C)  TempSrc: Temporal Temporal Temporal Axillary  SpO2: 100% 100% 100% 95%  Weight:    93.2 kg  Height:    5\' 8"  (1.727 m)    Intake/Output Summary (Last 24 hours) at 11/27/2019 0900 Last data filed at 11/26/2019 1930 Gross per 24 hour  Intake --  Output 1 ml  Net -1 ml   Filed Weights   11/25/19 1430 11/27/19 0839  Weight: 91 kg 93.2 kg    Examination:  General exam: Appears uncomfortable   Respiratory system:  Decreased breath sounds  b/l. No rales Cardiovascular system: S1 & S2 +. No rubs, gallops or clicks. B/l LE  2+ pitting edema Gastrointestinal system: Abdomen is obese, soft and nontender.  Hypoactive bowel sounds heard. Central nervous system: oriented to self only. Not following commands Psychiatry: Judgement and insight appear abnormal. Flat mood and affect      Data Reviewed: I have personally reviewed following labs and imaging studies  CBC: Recent Labs  Lab 11/25/19 1450 11/26/19 1728 11/27/19 0157  WBC 6.2 8.3 7.1  NEUTROABS 4.6  --   --   HGB 10.7* 10.9* 10.4*  HCT 32.1* 33.3* 31.4*  MCV 83.4 83.5 83.1  PLT 163 156 250   Basic Metabolic Panel: Recent Labs  Lab 11/25/19 1450 11/25/19 2204 11/26/19 0419 11/26/19 1728 11/27/19 0157  NA 134* 142 142 137 139  K 5.5* 3.8 4.1 4.5 3.9  CL 100 105 105 103 104  CO2 19* 24 25 24 24   GLUCOSE 668* 238* 257* 341* 300*  BUN 27* 21 21 18 19   CREATININE 2.02* 1.69* 1.54* 1.53* 1.62*  CALCIUM 9.1 9.2 8.8* 8.4* 8.2*   GFR: Estimated Creatinine Clearance: 38.9 mL/min (A) (by C-G formula based on SCr of 1.62 mg/dL (H)). Liver Function Tests: Recent Labs  Lab 11/25/19 1450 11/26/19 1728 11/27/19 0157  AST 62* 48* 46*  ALT 77* 61* 54*  ALKPHOS 119 102 98  BILITOT 0.7 0.8 0.7  PROT 6.7 6.2* 6.2*  ALBUMIN 3.6 3.2* 3.2*   No results for input(s): LIPASE, AMYLASE in the last 168 hours. Recent Labs  Lab 11/26/19 0058  AMMONIA 26   Coagulation Profile: No results for input(s): INR, PROTIME in the last 168 hours. Cardiac Enzymes: Recent Labs  Lab 11/25/19 1450  CKTOTAL 164   BNP (last 3 results) No results for input(s): PROBNP in the last 8760 hours. HbA1C: No results for input(s): HGBA1C in the last 72 hours. CBG: Recent Labs  Lab 11/26/19 0243 11/26/19 0355 11/26/19 0943 11/26/19 1241 11/26/19 1645  GLUCAP 165* 219* 330* 317* 317*   Lipid Profile: No results for input(s): CHOL, HDL, LDLCALC, TRIG, CHOLHDL, LDLDIRECT in the last  72 hours. Thyroid Function Tests: Recent Labs    11/25/19 2204  TSH 1.380  FREET4 1.05   Anemia Panel: No results for input(s): VITAMINB12, FOLATE, FERRITIN, TIBC, IRON, RETICCTPCT in the last 72 hours. Sepsis Labs: Recent Labs  Lab 11/25/19 1450 11/25/19 2204 11/26/19 0058  LATICACIDVEN 3.7* 3.0* 1.9    Recent Results (from the past 240 hour(s))  Respiratory Panel by RT PCR (Flu A&B, Covid) - Urine, Clean Catch     Status: None   Collection Time: 11/25/19  4:22 PM   Specimen: Urine, Clean Catch  Result Value Ref Range Status   SARS Coronavirus 2 by RT PCR NEGATIVE NEGATIVE Final    Comment: (NOTE) SARS-CoV-2 target nucleic acids are NOT DETECTED. The SARS-CoV-2 RNA is generally detectable in upper respiratoy specimens during the acute phase of infection. The lowest concentration of SARS-CoV-2 viral copies this assay can detect is 131 copies/mL. A negative result  does not preclude SARS-Cov-2 infection and should not be used as the sole basis for treatment or other patient management decisions. A negative result may occur with  improper specimen collection/handling, submission of specimen other than nasopharyngeal swab, presence of viral mutation(s) within the areas targeted by this assay, and inadequate number of viral copies (<131 copies/mL). A negative result must be combined with clinical observations, patient history, and epidemiological information. The expected result is Negative. Fact Sheet for Patients:  PinkCheek.be Fact Sheet for Healthcare Providers:  GravelBags.it This test is not yet ap proved or cleared by the [place] FDA and  has been authorized for detection and/or diagnosis of SARS-CoV-2 by FDA under an Emergency Use Authorization (EUA). This EUA will remain  in effect (meaning this test can be used) for the duration of the COVID-19 declaration under Section 564(b)(1) of the Act, 21  U.S.C. section 360bbb-3(b)(1), unless the authorization is terminated or revoked sooner.    Influenza A by PCR NEGATIVE NEGATIVE Final   Influenza B by PCR NEGATIVE NEGATIVE Final    Comment: (NOTE) The Xpert Xpress SARS-CoV-2/FLU/RSV assay is intended as an aid in  the diagnosis of influenza from Nasopharyngeal swab specimens and  should not be used as a sole basis for treatment. Nasal washings and  aspirates are unacceptable for Xpert Xpress SARS-CoV-2/FLU/RSV  testing. Fact Sheet for Patients: PinkCheek.be Fact Sheet for Healthcare Providers: GravelBags.it This test is not yet approved or cleared by the [place] FDA and  has been authorized for detection and/or diagnosis of SARS-CoV-2 by  FDA under an Emergency Use Authorization (EUA). This EUA will remain  in effect (meaning this test can be used) for the duration of the  Covid-19 declaration under Section 564(b)(1) of the Act, 21  U.S.C. section 360bbb-3(b)(1), unless the authorization is  terminated or revoked. Performed at [facility], [address] [place], [place] [address]   CULTURE, BLOOD (ROUTINE X 2) w Reflex to ID Panel     Status: None (Preliminary result)   Collection Time: 11/26/19 10:51 AM   Specimen: BLOOD  Result Value Ref Range Status   Specimen Description BLOOD RIGHT ANTECUBITAL  Final   Special Requests   Final    BOTTLES DRAWN AEROBIC AND ANAEROBIC Blood Culture adequate volume   Culture   Final    NO GROWTH < 24 HOURS Performed at [facility], [address] [place], [place] [address]    Report Status PENDING  Incomplete  CULTURE, BLOOD (ROUTINE X 2) w Reflex to ID Panel     Status: None (Preliminary result)   Collection Time: 11/26/19 10:51 AM   Specimen: BLOOD  Result Value Ref Range Status   Specimen Description BLOOD BLOOD LEFT FOREARM  Final   Special Requests   Final    BOTTLES DRAWN AEROBIC AND ANAEROBIC  Blood Culture adequate volume   Culture   Final    NO GROWTH < 24 HOURS Performed at [facility], [address] [place], [place] [address]    Report Status PENDING  Incomplete         Radiology Studies: CT ABDOMEN PELVIS WO CONTRAST  Result Date: 11/26/2019 CLINICAL DATA:  Abdominal distension with elevated lactate. EXAM: CT ABDOMEN AND PELVIS WITHOUT CONTRAST TECHNIQUE: Multidetector CT imaging of the abdomen and pelvis was performed following the standard protocol without IV contrast. COMPARISON:  None. FINDINGS: LOWER CHEST: No basilar pleural or apical pericardial effusion. HEPATOBILIARY: Diffuse hypoattenuation of the liver relative to the spleen  suggests hepatic steatosis. No focal liver lesion or biliary dilatation. There is focal fatty sparing along the gallbladder fossa and along the anterior hepatic edge. Normal gallbladder. PANCREAS: Normal pancreas. No ductal dilatation or peripancreatic fluid collection. SPLEEN: Normal. ADRENALS/URINARY TRACT: --Adrenal glands: Normal. --Kidneys and ureters: No hydronephrosis, nephroureterolithiasis or solid renal mass. Mild bilateral perinephric stranding, likely chronic. --Urinary bladder: Normal for degree of distention STOMACH/BOWEL: --Stomach/Duodenum: No hiatal hernia. Normal duodenal course and caliber. --Small bowel: No dilatation or inflammation. --Colon: No focal abnormality. --Appendix: Normal. VASCULAR/LYMPHATIC: There is calcific atherosclerosis of the abdominal aorta. No abdominal or pelvic lymphadenopathy. REPRODUCTIVE: Normal prostate size with symmetric seminal vesicles. MUSCULOSKELETAL. No bony spinal canal stenosis or focal osseous abnormality. OTHER: None. IMPRESSION: 1. No acute abdominal or pelvic abnormality. 2. Hepatic steatosis. 3. Aortic Atherosclerosis (ICD10-I70.0). Electronically Signed   By: Ulyses Jarred M.D.   On: 11/26/2019 00:26   DG Abd 1 View  Result Date: 11/25/2019 CLINICAL DATA:  Abdominal  distension EXAM: ABDOMEN - 1 VIEW COMPARISON:  CT abdomen pelvis 05/13/2010 FINDINGS: No high-grade obstructive bowel gas pattern. No suspicious calcifications over the gallbladder fossa. Coarse calcification is seen in the region of the left renal pelvis. No calcifications seen along the course of either ureter. Vascular calcium noted in the pelvis. Degenerative changes in the spine and hips. Lung bases are clear. Included mediastinal contours are unremarkable. IMPRESSION: 1. Calcification of the left renal pelvis could reflect, possible urolithiasis versus vascular calcium. No calcifications along the course of the ureters. 2. No high-grade obstructive bowel gas pattern. Electronically Signed   By: Lovena Le M.D.   On: 11/25/2019 22:05   CT Head Wo Contrast  Result Date: 11/25/2019 CLINICAL DATA:  Patient was found down. EXAM: CT HEAD WITHOUT CONTRAST CT CERVICAL SPINE WITHOUT CONTRAST TECHNIQUE: Multidetector CT imaging of the head and cervical spine was performed following the standard protocol without intravenous contrast. Multiplanar CT image reconstructions of the cervical spine were also generated. COMPARISON:  CT scan dated 01/21/2019 FINDINGS: CT HEAD FINDINGS Brain: No evidence of acute infarction, hemorrhage, hydrocephalus, extra-axial collection or mass lesion/mass effect. There appears to be an old small left cerebellar hemisphere infarct, unchanged. Vascular: No hyperdense vessel or unexpected calcification. Skull: Normal. Negative for fracture or focal lesion. Sinuses/Orbits: No acute finding. Other: None CT CERVICAL SPINE FINDINGS Alignment: Straightening of the cervical lordosis. Skull base and vertebrae: No acute fracture. No primary bone lesion. Ankylosis of the C3 and C4 vertebra and left lateral masses. Soft tissues and spinal canal: Hypertrophy of the transverse ligament at C1-2. The spinal cord is not compressed at that level. No prevertebral soft tissue swelling visible canal  hematoma. Disc levels: C2-3: No significant disc bulging. Moderate left facet arthritis. No foraminal stenosis. C3-4: No disc bulging or protrusion. The vertebra and left lateral masses are fused. No foraminal stenosis. C4-5: Disc space narrowing. Osteophytes protrude into both neural foramina without significant foraminal stenosis. C5-6: Disc space narrowing with prominent degenerative changes of the vertebral endplates. Broad-based disc osteophyte complex slightly narrows the AP dimension of the spinal canal. Uncinate spurs extend into both neural foramina with moderate bilateral foraminal stenosis. C6-7: Chronic disc space narrowing. Broad-based disc osteophyte complex with moderate left foraminal stenosis. C7-T1: Moderate right facet arthritis. Otherwise negative. Upper chest: Emphysematous changes at the right lung apex. Other: None IMPRESSION: 1. No acute intracranial abnormality. 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 2. No acute abnormality of the cervical spine. Multilevel degenerative disc and joint disease. Electronically Signed  By: Lorriane Shire M.D.   On: 11/25/2019 15:28   CT Cervical Spine Wo Contrast  Result Date: 11/25/2019 CLINICAL DATA:  Patient was found down. EXAM: CT HEAD WITHOUT CONTRAST CT CERVICAL SPINE WITHOUT CONTRAST TECHNIQUE: Multidetector CT imaging of the head and cervical spine was performed following the standard protocol without intravenous contrast. Multiplanar CT image reconstructions of the cervical spine were also generated. COMPARISON:  CT scan dated 01/21/2019 FINDINGS: CT HEAD FINDINGS Brain: No evidence of acute infarction, hemorrhage, hydrocephalus, extra-axial collection or mass lesion/mass effect. There appears to be an old small left cerebellar hemisphere infarct, unchanged. Vascular: No hyperdense vessel or unexpected calcification. Skull: Normal. Negative for fracture or focal lesion. Sinuses/Orbits: No acute finding. Other: None CT CERVICAL SPINE  FINDINGS Alignment: Straightening of the cervical lordosis. Skull base and vertebrae: No acute fracture. No primary bone lesion. Ankylosis of the C3 and C4 vertebra and left lateral masses. Soft tissues and spinal canal: Hypertrophy of the transverse ligament at C1-2. The spinal cord is not compressed at that level. No prevertebral soft tissue swelling visible canal hematoma. Disc levels: C2-3: No significant disc bulging. Moderate left facet arthritis. No foraminal stenosis. C3-4: No disc bulging or protrusion. The vertebra and left lateral masses are fused. No foraminal stenosis. C4-5: Disc space narrowing. Osteophytes protrude into both neural foramina without significant foraminal stenosis. C5-6: Disc space narrowing with prominent degenerative changes of the vertebral endplates. Broad-based disc osteophyte complex slightly narrows the AP dimension of the spinal canal. Uncinate spurs extend into both neural foramina with moderate bilateral foraminal stenosis. C6-7: Chronic disc space narrowing. Broad-based disc osteophyte complex with moderate left foraminal stenosis. C7-T1: Moderate right facet arthritis. Otherwise negative. Upper chest: Emphysematous changes at the right lung apex. Other: None IMPRESSION: 1. No acute intracranial abnormality. 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 2. No acute abnormality of the cervical spine. Multilevel degenerative disc and joint disease. Electronically Signed   By: Lorriane Shire M.D.   On: 11/25/2019 15:28   MR Brain Wo Contrast (neuro protocol)  Result Date: 11/25/2019 CLINICAL DATA:  Encephalopathy and aphasia. Found down. EXAM: MRI HEAD WITHOUT CONTRAST TECHNIQUE: Multiplanar, multiecho pulse sequences of the brain and surrounding structures were obtained without intravenous contrast. COMPARISON:  Brain MRI 12/02/2016 FINDINGS: BRAIN: There is no acute infarct, acute hemorrhage or extra-axial collection. Mild white matter hyperintensity, most commonly due to  chronic ischemic microangiopathy, though not unexpected for age. There is generalized atrophy without lobar predilection. The midline structures are normal. VASCULAR: The major intracranial arterial and venous sinus flow voids are normal. Small amount of hemosiderin deposition in the right central sulcus. SKULL AND UPPER CERVICAL SPINE: Calvarial bone marrow signal is normal. There is no skull base mass. The visualized upper cervical spine and soft tissues are normal. SINUSES/ORBITS: There are no fluid levels or advanced mucosal thickening. The mastoid air cells and middle ear cavities are free of fluid. The orbits are normal. IMPRESSION: 1. No acute intracranial abnormality. 2. Generalized atrophy and mild chronic small vessel disease. Electronically Signed   By: Ulyses Jarred M.D.   On: 11/25/2019 23:37   US Carotid Bilateral (at [facility] and AP only)  Result Date: 11/26/2019 CLINICAL DATA:  TIA. History of CABG, hypertension, hyperlipidemia and diabetes. Former smoker. EXAM: BILATERAL CAROTID DUPLEX ULTRASOUND TECHNIQUE: Pearline Cables scale imaging, color Doppler and duplex ultrasound were performed of bilateral carotid and vertebral arteries in the neck. COMPARISON:  Carotid Doppler ultrasound-12/02/2016 FINDINGS: Examination is degraded secondary to patient's inability to tolerate positioning  necessary for the ultrasound. Criteria: Quantification of carotid stenosis is based on velocity parameters that correlate the residual internal carotid diameter with NASCET-based stenosis levels, using the diameter of the distal internal carotid lumen as the denominator for stenosis measurement. The following velocity measurements were obtained: RIGHT ICA: 71/20 cm/sec CCA: 47/4 cm/sec SYSTOLIC ICA/CCA RATIO:  1.5 ECA: 306 cm/sec LEFT ICA: 78/20 cm/sec CCA: 25/95 cm/sec SYSTOLIC ICA/CCA RATIO:  0.8 ECA: 105 cm/sec RIGHT CAROTID ARTERY: There is a moderate amount of eccentric mixed echogenic plaque within the right carotid bulb  (image 24), extending to involve the origin and proximal aspects of the right internal carotid artery (image 33, morphologically similar to the 11/2016 examination, and again not resulting in elevated peak systolic velocities within the interrogated course of the right internal carotid artery to suggest a hemodynamically significant stenosis. RIGHT VERTEBRAL ARTERY:  Antegrade Flow LEFT CAROTID ARTERY: There is a minimal amount of intimal thickening/atherosclerotic plaque involving the mid and distal aspects of the left common carotid artery as well as the left carotid bulb, morphologically similar to the 11/2016 examination, and again not resulting in elevated peak systolic velocities within the interrogated course of the left internal carotid artery to suggest a hemodynamically significant stenosis. LEFT VERTEBRAL ARTERY:  Antegrade flow IMPRESSION: Minimal to moderate amount of bilateral atherosclerotic plaque, right greater than left, morphologically similar to the 11/2016 examination, and again not resulting in elevated peak systolic velocities within either internal carotid artery to suggest a hemodynamically significant stenosis. Electronically Signed   By: Sandi Mariscal M.D.   On: 11/26/2019 16:15   DG Chest Port 1 View  Result Date: 11/26/2019 CLINICAL DATA:  Altered mental status. EXAM: PORTABLE CHEST 1 VIEW COMPARISON:  11/25/2019 FINDINGS: Mild cardiac enlargement. No pleural effusion or edema. No airspace opacities identified. The visualized osseous structures are unremarkable. IMPRESSION: 1. No acute cardiopulmonary abnormalities. 2. Mild cardiac enlargement. Electronically Signed   By: Kerby Moors M.D.   On: 11/26/2019 10:41   DG Chest Portable 1 View  Result Date: 11/25/2019 CLINICAL DATA:  Altered mental status, history lymphoma, coronary artery disease, diabetes mellitus, CHF, hypertension, former smoker EXAM: PORTABLE CHEST 1 VIEW COMPARISON:  Portable exam 1509 hours compared  07/14/2019 FINDINGS: Upper normal heart size. Mediastinal contours and pulmonary vascularity normal. Atherosclerotic calcification aorta. Lungs clear. No infiltrate, pleural effusion or pneumothorax. No acute osseous findings. IMPRESSION: No acute abnormalities. Aortic Atherosclerosis (ICD10-I70.0). Electronically Signed   By: Lavonia Dana M.D.   On: 11/25/2019 15:26   ECHOCARDIOGRAM COMPLETE  Result Date: 11/27/2019   ECHOCARDIOGRAM REPORT   Patient Name:   RAHEEN CAPILI Date of Exam: 11/26/2019 Medical Rec #:  [id number]         Height:       70.0 in Accession #:    [id number]        Weight:       200.6 lb Date of Birth:  03-28-1935         BSA:          2.09 m Patient Age:    84 years          BP:           141/87 mmHg Patient Gender: M                 HR:           112 bpm. Exam Location:  [facility] Procedure: 2D Echo, Cardiac Doppler and Color Doppler Indications:  TIA 435.9  History:         Patient has prior history of Echocardiogram examinations. CAD;                  Risk Factors:Hypertension.  Sonographer:     Alyse Low Roar Referring Phys:  [place] Diagnosing Phys: Nelva Bush MD  Sonographer Comments: Technically difficult study due to poor echo windows. Image acquisition challenging due to patient behavioral factors. IMPRESSIONS  1. Left ventricular ejection fraction, by visual estimation, is 50 to 55%. The left ventricle has low normal function. There is mildly increased left ventricular hypertrophy.  2. Indeterminate diastolic filling due to E-A fusion.  3. Global right ventricle has low normal systolic function.The right ventricular size is normal. No increase in right ventricular wall thickness.  4. Pulmonary artery pressure is likely upper normal to mildly elevated (PASP 30 mmHg plus central venous pressure).  5. Left atrial size was normal.  6. Right atrial size was normal.  7. The mitral valve is grossly normal. No evidence of mitral valve regurgitation. No evidence of  mitral stenosis.  8. The tricuspid valve is normal in structure.  9. Aortic valve regurgitation is mild to moderate. 10. The aortic valve is tricuspid. Aortic valve regurgitation is mild to moderate. Mild aortic valve stenosis. 11. Aortic regurgitation appears mild by color Doppler, though spectral Doppler envelope suggests more significant regurgitation. 12. The pulmonic valve was not well visualized. Pulmonic valve regurgitation is not visualized. 13. The interatrial septum was not well visualized. FINDINGS  Left Ventricle: Left ventricular ejection fraction, by visual estimation, is 50 to 55%. The left ventricle has low normal function. The left ventricle is not well visualized. The left ventricular internal cavity size was the left ventricle is normal in size. There is mildly increased left ventricular hypertrophy. Indeterminate diastolic filling due to E-A fusion. Right Ventricle: The right ventricular size is normal. No increase in right ventricular wall thickness. Global RV systolic function is has low normal systolic function. Pulmonary artery pressure is likely upper normal to mildly elevated (PASP 30 mmHg plus central venous pressure). Left Atrium: Left atrial size was normal in size. Right Atrium: Right atrial size was normal in size Pericardium: There is no evidence of pericardial effusion. Mitral Valve: The mitral valve is grossly normal. No evidence of mitral valve regurgitation. No evidence of mitral valve stenosis by observation. Tricuspid Valve: The tricuspid valve is normal in structure. Tricuspid valve regurgitation mild-moderate. Aortic Valve: The aortic valve is tricuspid. . There is moderate thickening and mild calcification of the aortic valve. Aortic valve regurgitation is mild to moderate. Aortic regurgitation PHT measures 209 msec. Mild aortic stenosis is present. There is moderate thickening of the aortic valve. There is mild calcification of the aortic valve. Aortic valve mean gradient  measures 17.5 mmHg. Aortic valve peak gradient measures 30.6 mmHg. Aortic valve area, by VTI measures 1.32 cm. Aortic regurgitation appears mild by color Doppler, though spectral Doppler envelope suggests more significant regurgitation. Pulmonic Valve: The pulmonic valve was not well visualized. Pulmonic valve regurgitation is not visualized. Pulmonic regurgitation is not visualized. No evidence of pulmonic stenosis. Aorta: The aortic root is normal in size and structure. Pulmonary Artery: The pulmonary artery is not well seen. Venous: The inferior vena cava was not well visualized. IAS/Shunts: The interatrial septum was not well visualized.  LEFT VENTRICLE PLAX 2D LVIDd:         4.89 cm  Diastology LVIDs:  3.96 cm  LV e' lateral:   5.00 cm/s LV PW:         1.24 cm  LV E/e' lateral: 25.2 LV IVS:        1.34 cm  LV e' medial:    4.90 cm/s LVOT diam:     2.10 cm  LV E/e' medial:  25.7 LV SV:         44 ml LV SV Index:   20.53 LVOT Area:     3.46 cm  RIGHT VENTRICLE RV S prime:     16.80 cm/s LEFT ATRIUM           Index       RIGHT ATRIUM           Index LA diam:      3.20 cm 1.53 cm/m  RA Area:     10.10 cm LA Vol (A2C): 60.6 ml 28.99 ml/m RA Volume:   18.90 ml  9.04 ml/m LA Vol (A4C): 15.5 ml 7.42 ml/m  AORTIC VALVE                    PULMONIC VALVE AV Area (Vmax):    1.43 cm     PV Vmax:        0.96 m/s AV Area (Vmean):   1.35 cm     PV Peak grad:   3.7 mmHg AV Area (VTI):     1.32 cm     RVOT Peak grad: 2 mmHg AV Vmax:           276.50 cm/s AV Vmean:          196.500 cm/s AV VTI:            0.421 m AV Peak Grad:      30.6 mmHg AV Mean Grad:      17.5 mmHg LVOT Vmax:         114.00 cm/s LVOT Vmean:        76.700 cm/s LVOT VTI:          0.160 m LVOT/AV VTI ratio: 0.38 AI PHT:            209 msec  AORTA Ao Root diam: 2.70 cm MITRAL VALVE                        TRICUSPID VALVE MV Area (PHT): 6.65 cm             TR Peak grad:   30.5 mmHg MV PHT:        33.06 msec           TR Vmax:        276.00 cm/s  MV Decel Time: 114 msec MV E velocity: 126.00 cm/s 103 cm/s SHUNTS                                     Systemic VTI:  0.16 m                                     Systemic Diam: 2.10 cm  Nelva Bush MD Electronically signed by Nelva Bush MD Signature Date/Time: 11/27/2019/7:29:06 AM    Final         Scheduled Meds: .  stroke: mapping our early stages of recovery book   Does  not apply Once  . insulin aspart  0-9 Units Subcutaneous Q4H  . insulin glargine  10 Units Subcutaneous QHS  . ipratropium-albuterol  3 mL Nebulization Once  . ipratropium-albuterol  3 mL Nebulization Q6H   Continuous Infusions: . sodium chloride 75 mL/hr at 11/27/19 0352  . azithromycin Stopped (11/26/19 1526)  . cefTRIAXone (ROCEPHIN)  IV Stopped (11/26/19 1335)     LOS: 1 day    Time spent: 33 mins    Wyvonnia Dusky, MD [facility] Pager [phone]  If 7PM-7AM, please contact night-coverage [email or web address] Password [email or web address] 11/27/2019, 9:00 AM

## 2019-11-27 NOTE — Progress Notes (Signed)
eeg completed ° °

## 2019-11-27 NOTE — Clinical Social Work Note (Signed)
CSW acknowledges consult for substance abuse counseling. According to chart review, patient has no smoked cigarettes since 2016. He does not drink. Tox screen negative. There appears to be no needs in this regard at this time. CSW following for high readmission risk score. Patient currently disoriented x 4. Will complete assessment when appropriate.  Brett [name], [place]

## 2019-11-27 NOTE — Progress Notes (Signed)
Pt sleeping. No agitation at present. Sitter remains at bedside.

## 2019-11-27 NOTE — Progress Notes (Signed)
PT Cancellation Note  Patient Details Name: Brett [name] MRN: [id number] DOB: 04-29-1935   Cancelled Treatment:    Reason Eval/Treat Not Completed: Patient's level of consciousness; Per nursing pt with significant AMS, unable to follow commands, and not appropriate for PT services at this time.  Will continue to follow pt and attempt to see pt at a future date/time as medically appropriate.     Linus Salmons PT, DPT 11/27/19, 9:17 AM

## 2019-11-27 NOTE — Progress Notes (Signed)
Pt becoming agitated. Haldol given as ordered.

## 2019-11-27 NOTE — Progress Notes (Signed)
OT Cancellation Note  Patient Details Name: Brett [name] MRN: [id number] DOB: 1935/04/14   Cancelled Treatment:    Reason Eval/Treat Not Completed: Medical issues which prohibited therapy;Other (comment). Thank you for the OT consult. Order received and chart reviewed. Pt noted to be pending repeat troponin's and had recent Haldol administration at 0543 this am. Will hold OT eval at this time and re-attempt at a later time/date as available and pt medically appropriate for activity.   Shara Blazing, M.S., OTR/L Ascom: [phone] 11/27/19, 8:24 AM

## 2019-11-27 NOTE — Procedures (Signed)
ELECTROENCEPHALOGRAM REPORT   Patient: Brett [name]       Room #: IC18A-AA EEG No. ID: [id number] Age: 84 y.o.        Sex: male Referring Physician: Jimmye Norman Report Date:  11/27/2019        Interpreting Physician: Alexis Goodell  History: Brett [name] is an 84 y.o. male with altered mental status  Medications:  Lorazepam, Zithromax, Rocephin, Insulin  Conditions of Recording:  This is a 21 channel routine scalp EEG performed with bipolar and monopolar montages arranged in accordance to the international 10/20 system of electrode placement. One channel was dedicated to EKG recording.  The patient is in the altered state.  Description:  The background activity is low voltage, slow and poorly organized resembling that of drowse with a mixture of theta and delta activity noted.  This activity is continuous and diffusely distributed.    No epileptiform activity is noted.   Hyperventilation and intermittent photic stimulation were not performed.   IMPRESSION: This electroencephalogram is characterized by slowing.  Although this slowing may represent drowse, can not rule out slowing related to a diffuse cerebral disturbance such as a metabolic encephalopathy.  Clinical correlation recommended.  No epileptiform activity is noted.     Alexis Goodell, MD Neurology [phone] 11/27/2019, 3:42 PM

## 2019-11-27 NOTE — Progress Notes (Signed)
Inpatient Diabetes Program Recommendations  AACE/ADA: New Consensus Statement on Inpatient Glycemic Control (2015)  Target Ranges:  Prepandial:   less than 140 mg/dL      Peak postprandial:   less than 180 mg/dL (1-2 hours)      Critically ill patients:  140 - 180 mg/dL   Results for Brett [name], Brett [name] (MRN [id number]) as of 11/27/2019 10:16  Ref. Range 11/26/2019 00:22 11/26/2019 01:31 11/26/2019 02:43 11/26/2019 03:55 11/26/2019 09:43 11/26/2019 12:41 11/26/2019 16:45  Glucose-Capillary Latest Ref Range: 70 - 99 mg/dL 236 (H)  IV Insulin Drip 185 (H)  IV Insulin Drip 165 (H)  IV Insulin Drip OFF 219 (H)  3 units NOVOLOG  330 (H)  7 units NOVOLOG  317 (H)  7 units NOVOLOG  317 (H)  7 units NOVOLOG +  10 units LANTUS given at 9:57pm    CBGs since Midnight today: 338/ 261/ 62    Admit with: Acute encephalopathy versus TIA/CVA  History: DM, CHF, COPD, B-cell lymphoma status post chemotherapy  Home DM Meds: Amaryl 4 mg Daily  Current Orders: Lantus 10 units QHS                            Novolog Sensitive Correction Scale/ SSI (0-9 units) Q4 hours     MD- Please consider the following in-hospital insulin adjustments:  1. Increase Lantus to 18 units QHS (0.2 units/kg)  2. Increase Novolog SSi to the Moderate scale (0-15 units) Q4 hours      --Will follow patient during hospitalization--  Wyn Quaker RN, MSN, CDE Diabetes Coordinator Inpatient Glycemic Control Team Team Pager: [phone] (8a-5p)

## 2019-11-27 NOTE — Progress Notes (Signed)
Pt transferred to ICU bed 18 via stretcher accompanied by RN and CNA, report given to ICU RN, VSS, pt yelling and moving all extremities. Abdomen distented.  Disoriented x4, incontinent of urine, Pt yelling that he is hurting. Unable to tell you what is hurting. Blood sugar 239 insulin given accordingly to scale.

## 2019-11-27 NOTE — Progress Notes (Signed)
Spoke with hospitalist and repeat troponin to be performed.

## 2019-11-27 NOTE — Progress Notes (Signed)
SLP Cancellation Note  Patient Details Name: Brett [name] MRN: [id number] DOB: 10-27-1935   Cancelled treatment:       Reason Eval/Treat Not Completed: Medical issues which prohibited therapy;Patient not medically ready;Patient's level of consciousness(per chart reviewe; NSG consulted). Pt continues to be febrile; MD tx'ing. Pt remains severely confused per NSG.  Pt is not appropriate for BSE or any oral intake at this time; there is increased risk for aspiration d/t declined Cognitive status. Recommend continue NPO status w/ frequent oral care for hygiene and stimulation of swallowing. ST services will f/u tomorrow. NSG agreed.   [name], MS, CCC-SLP Jamica [facility]yard 11/27/2019, 11:12 AM

## 2019-11-27 NOTE — Progress Notes (Signed)
Notified hospitalist of pt's continued fever after tylenol. Also discussed that only 2 troponin tests have been performed and 2 orders are in the order history. Verified that only the 2 troponin tests that have resulted are the only ones that is ordered at this time.

## 2019-11-28 ENCOUNTER — Inpatient Hospital Stay: Payer: Medicare PPO

## 2019-11-28 DIAGNOSIS — J181 Lobar pneumonia, unspecified organism: Secondary | ICD-10-CM

## 2019-11-28 DIAGNOSIS — I5033 Acute on chronic diastolic (congestive) heart failure: Secondary | ICD-10-CM

## 2019-11-28 LAB — MAGNESIUM: Magnesium: 2 mg/dL (ref 1.7–2.4)

## 2019-11-28 LAB — GLUCOSE, CAPILLARY
Glucose-Capillary: 161 mg/dL — ABNORMAL HIGH (ref 70–99)
Glucose-Capillary: 194 mg/dL — ABNORMAL HIGH (ref 70–99)
Glucose-Capillary: 212 mg/dL — ABNORMAL HIGH (ref 70–99)
Glucose-Capillary: 221 mg/dL — ABNORMAL HIGH (ref 70–99)
Glucose-Capillary: 231 mg/dL — ABNORMAL HIGH (ref 70–99)
Glucose-Capillary: 248 mg/dL — ABNORMAL HIGH (ref 70–99)

## 2019-11-28 LAB — COMPREHENSIVE METABOLIC PANEL
ALT: 44 U/L (ref 0–44)
AST: 38 U/L (ref 15–41)
Albumin: 2.8 g/dL — ABNORMAL LOW (ref 3.5–5.0)
Alkaline Phosphatase: 86 U/L (ref 38–126)
Anion gap: 14 (ref 5–15)
BUN: 22 mg/dL (ref 8–23)
CO2: 24 mmol/L (ref 22–32)
Calcium: 8 mg/dL — ABNORMAL LOW (ref 8.9–10.3)
Chloride: 102 mmol/L (ref 98–111)
Creatinine, Ser: 1.83 mg/dL — ABNORMAL HIGH (ref 0.61–1.24)
GFR calc Af Amer: 39 mL/min — ABNORMAL LOW (ref >60)
GFR calc non Af Amer: 34 mL/min — ABNORMAL LOW (ref >60)
Glucose, Bld: 189 mg/dL — ABNORMAL HIGH (ref 70–99)
Potassium: 4.3 mmol/L (ref 3.5–5.1)
Sodium: 140 mmol/L (ref 135–145)
Total Bilirubin: 0.8 mg/dL (ref 0.3–1.2)
Total Protein: 5.8 g/dL — ABNORMAL LOW (ref 6.5–8.1)

## 2019-11-28 LAB — CBC
HCT: 34.4 % — ABNORMAL LOW (ref 39.0–52.0)
Hemoglobin: 11.3 g/dL — ABNORMAL LOW (ref 13.0–17.0)
MCH: 27.4 pg (ref 26.0–34.0)
MCHC: 32.8 g/dL (ref 30.0–36.0)
MCV: 83.5 fL (ref 80.0–100.0)
Platelets: 191 10*3/uL (ref 150–400)
RBC: 4.12 MIL/uL — ABNORMAL LOW (ref 4.22–5.81)
RDW: 15 % (ref 11.5–15.5)
WBC: 7.7 10*3/uL (ref 4.0–10.5)
nRBC: 0 % (ref 0.0–0.2)

## 2019-11-28 LAB — URINE CULTURE: Culture: NO GROWTH

## 2019-11-28 LAB — MRSA PCR SCREENING: MRSA by PCR: NEGATIVE

## 2019-11-28 LAB — LEGIONELLA PNEUMOPHILA SEROGP 1 UR AG: L. pneumophila Serogp 1 Ur Ag: NEGATIVE

## 2019-11-28 LAB — PHOSPHORUS: Phosphorus: 4 mg/dL (ref 2.5–4.6)

## 2019-11-28 MED ORDER — PANTOPRAZOLE SODIUM 40 MG IV SOLR
40.0000 mg | INTRAVENOUS | Status: DC
  Administered 2019-11-28 – 2019-11-30 (×3): 40 mg via INTRAVENOUS
  Filled 2019-11-28: qty 40

## 2019-11-28 NOTE — Progress Notes (Signed)
NURSING PROGRESS NOTE  BANYAN GOODCHILD [id number] Transfer Data: 11/28/2019 7:59 AM Attending Provider: Wyvonnia Dusky, MD MWN:[id number], Nyra Jabs, MD Code Status: DNR   Brett [name] is a 84 y.o. male patient transferred from ICU -No acute distress noted.  -No complaints of shortness of breath.  -No complaints of chest pain.   Cardiac Monitoring: Box n place. Cardiac monitor yields:sinus tachycardia.  Last Documented Vital Signs: Blood pressure 121/78, pulse 98, temperature 99.3 F (37.4 C), temperature source Oral, resp. rate 18, height 5\' 8"  (1.727 m), weight 93.2 kg, SpO2 99 %.  IV Fluids:  IV in place, occlusive dsg intact without redness, IV cath forearm left, condition patent and no redness none.   Allergies:  Patient has no known allergies.  Past Medical History:   has a past medical history of Anemia, Aortic regurgitation, B-cell lymphoma ([place]) (2009), Bronchitis, CAD (coronary artery disease), Carotid stenosis, CHF (congestive heart failure) ([place]), Diabetes mellitus without complication ([place]), Diabetes mellitus, type 2 ([place]), Emphysema of lung ([place]), Essential hypertension, History of chemotherapy, Hyperlipidemia, Hypertension, IDA (iron deficiency anemia), Leg edema, Meralgia paraesthetica, Mitral regurgitation, PUD (peptic ulcer disease), PVD (peripheral vascular disease) ([place]), and Tobacco abuse.  Past Surgical History:   has a past surgical history that includes Cardiac catheterization (08/15/2007); Esophagogastroduodenoscopy (03/2013); Colonoscopy (03/2013); and Cardiac catheterization (Right, 07/13/2016).  Social History:   reports that he quit smoking about 4 years ago. His smoking use included cigarettes. He has never used smokeless tobacco. He reports that he does not drink alcohol or use drugs.  Skin: scattered bruises and reddened bottom  Patient/Family orientated to room. Information packet given to patient/family. Admission inpatient armband information  verified with patient/family to include name and date of birth and placed on patient arm. Side rails up x 2, fall assessment and education completed with patient/family. Patient/family able to verbalize understanding of risk associated with falls and verbalized understanding to call for assistance before getting out of bed. Call light within reach. Patient/family able to voice and demonstrate understanding of unit orientation instructions.    Will continue to evaluate and treat per MD orders.

## 2019-11-28 NOTE — NC FL2 (Signed)
[place] LEVEL OF CARE SCREENING TOOL     IDENTIFICATION  Patient Name: Brett [name] Birthdate: [birth year] Sex: male Admission Date (Current Location): 11/25/2019  [place] and [place] Number:  Engineering geologist and Address:  [facility], [address] [place], [place] [address]      Provider Number: [id number]  Attending Physician Name and Address:  Wyvonnia Dusky, MD  Relative Name and Phone Number:  Burna Cash [phone]    Current Level of Care: Hospital Recommended Level of Care: [place] Prior Approval Number:    Date Approved/Denied:   PASRR Number: [id number]  Discharge Plan: SNF    Current Diagnoses: Patient Active Problem List   Diagnosis Date Noted  . High serum osmolar gap 11/26/2019  . AF (paroxysmal atrial fibrillation) ([place]) 11/25/2019  . Acute encephalopathy 11/25/2019  . Atrial fibrillation with RVR ([place]) 05/22/2019  . Acute on chronic respiratory failure with hypoxemia ([place]) 02/09/2019  . Pneumonia 01/21/2019  . Type 2 diabetes mellitus with hyperosmolar nonketotic hyperglycemia ([place]) 08/24/2018  . CHF exacerbation ([place]) 05/29/2018  . COPD exacerbation ([place]) 12/05/2017  . CAP (community acquired pneumonia) 11/13/2017  . Acute respiratory failure ([place]) 11/13/2017  . SIRS (systemic inflammatory response syndrome) ([place]) 11/13/2017  . Stroke ([place]) 12/02/2016  . Stroke (cerebrum) ([place]) 12/02/2016  . Leg weakness, bilateral 09/03/2016  . Chronic diastolic heart failure ([place]) 08/06/2016  . Pleural effusion 08/05/2016  . Sepsis ([place]) 07/10/2016  . HCAP (healthcare-associated pneumonia) 07/10/2016  . HTN (hypertension) 07/10/2016  . Diabetes ([place]) 07/10/2016  . CAD (coronary artery disease) 07/10/2016  . COPD (chronic obstructive pulmonary disease) ([place]) 06/20/2016  . Chest pain 06/18/2016  . B-cell lymphoma ([place]) 10/14/2015    Orientation RESPIRATION BLADDER Height & Weight      Self  O2(1 liter o2 [place]) Incontinent Weight: 93.2 kg Height:  5\' 8"  (172.7 cm)  BEHAVIORAL SYMPTOMS/MOOD NEUROLOGICAL BOWEL NUTRITION STATUS  Other (Comment)(wearing mittens to prevent pulling on lines)   Incontinent    AMBULATORY STATUS COMMUNICATION OF NEEDS Skin   Extensive Assist Verbally Bruising(redness on sacrum)                       Personal Care Assistance Level of Assistance  Total care       Total Care Assistance: Maximum assistance   Functional Limitations Info             SPECIAL CARE FACTORS FREQUENCY  PT (By licensed PT), OT (By licensed OT)     PT Frequency: 5 times per week OT Frequency: 3 times per week            Contractures Contractures Info: Not present    Additional Factors Info  Code Status Code Status Info: DNR             Current Medications (11/28/2019):  This is the current hospital active medication list Current Facility-Administered Medications  Medication Dose Route Frequency Provider Last Rate Last Admin  .  stroke: mapping our early stages of recovery book   Does not apply Once Doutova, Nyoka Lint, MD      . 0.9 %  sodium chloride infusion   Intravenous Continuous Wyvonnia Dusky, MD   Stopped at 11/27/19 0820  . acetaminophen (TYLENOL) tablet 650 mg  650 mg Oral Q6H PRN Doutova, Anastassia, MD       Or  . acetaminophen (TYLENOL) suppository 650 mg  650 mg Rectal Q6H PRN Doutova, Anastassia,  MD      . acetaminophen (TYLENOL) suppository 650 mg  650 mg Rectal Q4H PRN Lang Snow, NP   650 mg at 11/27/19 0530  . albuterol (PROVENTIL) (2.5 MG/3ML) 0.083% nebulizer solution 2.5 mg  2.5 mg Inhalation Q6H PRN Toy Baker, MD      . azithromycin (ZITHROMAX) 500 mg in sodium chloride 0.9 % 250 mL IVPB  500 mg Intravenous Q24H Wyvonnia Dusky, MD   Stopped at 11/27/19 1405  . cefTRIAXone (ROCEPHIN) 1 g in sodium chloride 0.9 % 100 mL IVPB  1 g Intravenous Q24H Wyvonnia Dusky, MD   Stopped at  11/27/19 1436  . dextrose 50 % solution 0-50 mL  0-50 mL Intravenous PRN Doutova, Anastassia, MD      . enoxaparin (LOVENOX) injection 40 mg  40 mg Subcutaneous Q24H Wyvonnia Dusky, MD   40 mg at 11/27/19 2153  . haloperidol lactate (HALDOL) injection 5 mg  5 mg Intramuscular Q6H PRN Wyvonnia Dusky, MD   5 mg at 11/27/19 1814  . hydrALAZINE (APRESOLINE) injection 10 mg  10 mg Intravenous Q6H PRN Wyvonnia Dusky, MD      . insulin aspart (novoLOG) injection 0-15 Units  0-15 Units Subcutaneous Q4H Wyvonnia Dusky, MD   5 Units at 11/28/19 1336  . insulin glargine (LANTUS) injection 10 Units  10 Units Subcutaneous QHS Toy Baker, MD   10 Units at 11/27/19 2153  . ipratropium-albuterol (DUONEB) 0.5-2.5 (3) MG/3ML nebulizer solution 3 mL  3 mL Nebulization Once Doutova, Anastassia, MD      . ipratropium-albuterol (DUONEB) 0.5-2.5 (3) MG/3ML nebulizer solution 3 mL  3 mL Nebulization Q6H Doutova, Anastassia, MD   3 mL at 11/28/19 1346  . labetalol (NORMODYNE) injection 10 mg  10 mg Intravenous Q2H PRN Lang Snow, NP   10 mg at 11/27/19 2120  . LORazepam (ATIVAN) injection 1 mg  1 mg Intravenous Q6H PRN Wyvonnia Dusky, MD   1 mg at 11/28/19 0033  . MEDLINE mouth rinse  15 mL Mouth Rinse BID Lang Snow, NP   15 mL at 11/27/19 2200  . ondansetron (ZOFRAN) tablet 4 mg  4 mg Oral Q6H PRN Toy Baker, MD       Or  . ondansetron (ZOFRAN) injection 4 mg  4 mg Intravenous Q6H PRN Doutova, Anastassia, MD      . pantoprazole (PROTONIX) injection 40 mg  40 mg Intravenous Q24H Wyvonnia Dusky, MD       Facility-Administered Medications Ordered in Other Encounters  Medication Dose Route Frequency Provider Last Rate Last Admin  . sodium chloride flush (NS) 0.9 % injection 3 mL  3 mL Intravenous Q12H Jake Bathe, FNP         Discharge Medications: Please see discharge summary for a list of discharge medications.  Relevant Imaging  Results:  Relevant Lab Results:   Additional Information [id number]  Su Hilt, RN

## 2019-11-28 NOTE — Progress Notes (Signed)
OT Cancellation Note  Patient Details Name: Brett [name] MRN: [id number] DOB: March 01, 1935   Cancelled Treatment:    Reason Eval/Treat Not Completed: Patient's level of consciousness. Per discussion with physical therapist/nursing pt continues to present with significant AMS and is not currently appropriate for OT evaluation.  Will complete OT orders at this time but will reassess pt pending a change in status upon receipt of new OT orders.  Shara Blazing, M.S., OTR/L Ascom: [phone] 11/28/19, 9:51 AM

## 2019-11-28 NOTE — Progress Notes (Signed)
PROGRESS NOTE    Brett [name]  DGU:[id number] DOB: 04/16/1935 DOA: 11/25/2019 PCP: Perrin Maltese, MD      Assessment & Plan:   Active Problems:   B-cell lymphoma (HCC)   COPD (chronic obstructive pulmonary disease) (HCC)   Sepsis (HCC)   HTN (hypertension)   Diabetes (HCC)   CAD (coronary artery disease)   Chronic diastolic heart failure (HCC)   Stroke (HCC)   COPD exacerbation (HCC)   Type 2 diabetes mellitus with hyperosmolar nonketotic hyperglycemia (HCC)   AF (paroxysmal atrial fibrillation) (HCC)   Acute encephalopathy   High serum osmolar gap   Possibly acute metabolic encephalopathy: remains unchanged. Far from pt's baseline as per pt's MPOA, pt is normally alert & oriented & able to ambulate independently. MPOA is pt's friend [name]. CT brain & MRI brain showed no acute abnormalities. Ammonia is WNL. PT/OT consulted but unable to complete at this time. EEG shows slowing but no epileptiform activity. Neuro following and recs apprec. NPO. May need NG tube w/ tube feeds soon. Speech following and recs apprec   Sepsis: etiology unclear. Blood cxs NGTD & urine cx pending. Tylenol prn for fevers. Will continue on IV azithromycin & ceftriaxone.  Possible pneumonia: possible atypical or gram positive. Strep is neg. Legionella, & mycoplasma pending. Continue on IV ceftriaxone and azithromycin. Continue on supplemental oxygen and wean as tolerated. Nebs prn   Acute hypoxic respiratory failure: etiology unclear, possible CHF exacerbation and/or pneumonia. Repeat CXR shows edema and possible pneumonia. IV lasix x1 given. Continue on IV abxs. Continue on supplemental oxygen and wean as tolerated.  Acute on chronic diastolic heart failure: will hold off on lasix today secondary to worsening AKI. Monitor I/Os.   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 Cr continues to trend up. Avoid nephrotoxic meds. Will continue to monitor    B-cell lymphoma: chronic &  currently in remission  COPD: continue nebs, incentive spirometry. VBG obtained no evidence of CO2 retention to explain mental status changes.  HTN: IV hydralazine prn   CAD: chronic & stable. When able to tolerate p.o. resume home medications  DM2: poorly controlled. Continue on lantus and SSI w/ accuchecks   Possible PAF: IV metoprolol prn for HR >130. Patient apparently not anticoagulation he has amiodarone with his list of medication & his MPOA was unaware the patient has atrial fibrillation.   Elevated lactic acid: resolved. Etiology unclear.   Abdominal distention: etiology unclear. KUB & CT abd/pelvis shows no acute abnormailities   Transaminitis: etiology unclear. Resolved  Normocytic anemia: etiology unclear. No need for a transfusion at this time. Will continue to monitor   Right eye blindness: chronic as per pt's MPOA   DVT prophylaxis: lovenox Code Status: DNR Family Communication: Pt is estranged from his family who evidently lives in [place] and [name]  Disposition Plan:   Consultants:  Neuro   Procedures: n/a   Antimicrobials: azithromycin, ceftriaxone    Subjective: Pt is lethargic today and still not answering questions appropriately.   Objective: Vitals:   11/28/19 0307 11/28/19 0425 11/28/19 0736 11/28/19 0738  BP: 130/71  121/78   Pulse: (!) 104  98   Resp: 19  18   Temp: 100.1 F (37.8 C) 98.5 F (36.9 C) 99.3 F (37.4 C)   TempSrc: Axillary Axillary Oral   SpO2: 100%  99% 99%  Weight:      Height:        Intake/Output Summary (Last 24 hours) at 11/28/2019  [place] filed at 11/27/2019 1817 Gross per 24 hour  Intake 1070.03 ml  Output --  Net 1070.03 ml   Filed Weights   11/25/19 1430 11/27/19 0839  Weight: 91 kg 93.2 kg    Examination:  General exam: Appears uncomfortable   Respiratory system:  diminished breath sounds b/l. No rales Cardiovascular system: S1 & S2 +. No rubs, gallops or clicks. B/l LE  2+  pitting edema Gastrointestinal system: Abdomen is obese, soft and nontender.  normal bowel sounds heard. Central nervous system: lethargic. Not following commands Psychiatry: Judgement and insight appear abnormal. Flat mood and affect      Data Reviewed: I have personally reviewed following labs and imaging studies  CBC: Recent Labs  Lab 11/25/19 1450 11/26/19 1728 11/27/19 0157 11/28/19 0617  WBC 6.2 8.3 7.1 7.7  NEUTROABS 4.6  --   --   --   HGB 10.7* 10.9* 10.4* 11.3*  HCT 32.1* 33.3* 31.4* 34.4*  MCV 83.4 83.5 83.1 83.5  PLT 163 156 161 456   Basic Metabolic Panel: Recent Labs  Lab 11/25/19 2204 11/26/19 0419 11/26/19 1728 11/27/19 0157 11/28/19 0617  NA 142 142 137 139 140  K 3.8 4.1 4.5 3.9 4.3  CL 105 105 103 104 102  CO2 24 25 24 24 24   GLUCOSE 238* 257* 341* 300* 189*  BUN 21 21 18 19 22   CREATININE 1.69* 1.54* 1.53* 1.62* 1.83*  CALCIUM 9.2 8.8* 8.4* 8.2* 8.0*  MG  --   --   --   --  2.0  PHOS  --   --   --   --  4.0   GFR: Estimated Creatinine Clearance: 34.5 mL/min (A) (by C-G formula based on SCr of 1.83 mg/dL (H)). Liver Function Tests: Recent Labs  Lab 11/25/19 1450 11/26/19 1728 11/27/19 0157 11/28/19 0617  AST 62* 48* 46* 38  ALT 77* 61* 54* 44  ALKPHOS 119 102 98 86  BILITOT 0.7 0.8 0.7 0.8  PROT 6.7 6.2* 6.2* 5.8*  ALBUMIN 3.6 3.2* 3.2* 2.8*   No results for input(s): LIPASE, AMYLASE in the last 168 hours. Recent Labs  Lab 11/26/19 0058  AMMONIA 26   Coagulation Profile: No results for input(s): INR, PROTIME in the last 168 hours. Cardiac Enzymes: Recent Labs  Lab 11/25/19 1450  CKTOTAL 164   BNP (last 3 results) No results for input(s): PROBNP in the last 8760 hours. HbA1C: Recent Labs    11/27/19 0157  HGBA1C 12.4*   CBG: Recent Labs  Lab 11/27/19 1634 11/27/19 1952 11/27/19 2338 11/28/19 0308 11/28/19 0736  GLUCAP 216* 231* 212* 161* 194*   Lipid Profile: No results for input(s): CHOL, HDL, LDLCALC, TRIG,  CHOLHDL, LDLDIRECT in the last 72 hours. Thyroid Function Tests: Recent Labs    11/25/19 2204  TSH 1.380  FREET4 1.05   Anemia Panel: No results for input(s): VITAMINB12, FOLATE, FERRITIN, TIBC, IRON, RETICCTPCT in the last 72 hours. Sepsis Labs: Recent Labs  Lab 11/25/19 1450 11/25/19 2204 11/26/19 0058  LATICACIDVEN 3.7* 3.0* 1.9    Recent Results (from the past 240 hour(s))  Urine Culture     Status: Abnormal   Collection Time: 11/25/19  2:37 PM   Specimen: Urine, Random  Result Value Ref Range Status   Specimen Description   Final    URINE, RANDOM Performed at [facility], [address] [place], [place] [address]    Special Requests   Final    NONE Performed at  [address] [place], [place] [address]    Culture (A)  Final    <10,000 COLONIES/mL INSIGNIFICANT GROWTH Performed at [facility] [address] [place], [place] [address]    Report Status 11/27/2019 FINAL  Final  Respiratory Panel by RT PCR (Flu A&B, Covid) - Urine, Clean Catch     Status: None   Collection Time: 11/25/19  4:22 PM   Specimen: Urine, Clean Catch  Result Value Ref Range Status   SARS Coronavirus 2 by RT PCR NEGATIVE NEGATIVE Final    Comment: (NOTE) SARS-CoV-2 target nucleic acids are NOT DETECTED. The SARS-CoV-2 RNA is generally detectable in upper respiratoy specimens during the acute phase of infection. The lowest concentration of SARS-CoV-2 viral copies this assay can detect is 131 copies/mL. A negative result does not preclude SARS-Cov-2 infection and should not be used as the sole basis for treatment or other patient management decisions. A negative result may occur with  improper specimen collection/handling, submission of specimen other than nasopharyngeal swab, presence of viral mutation(s) within the areas targeted by this assay, and inadequate number of viral copies (<131 copies/mL). A negative result must be combined with  clinical observations, patient history, and epidemiological information. The expected result is Negative. Fact Sheet for Patients:  PinkCheek.be Fact Sheet for Healthcare Providers:  GravelBags.it This test is not yet ap proved or cleared by the [place] FDA and  has been authorized for detection and/or diagnosis of SARS-CoV-2 by FDA under an Emergency Use Authorization (EUA). This EUA will remain  in effect (meaning this test can be used) for the duration of the COVID-19 declaration under Section 564(b)(1) of the Act, 21 U.S.C. section 360bbb-3(b)(1), unless the authorization is terminated or revoked sooner.    Influenza A by PCR NEGATIVE NEGATIVE Final   Influenza B by PCR NEGATIVE NEGATIVE Final    Comment: (NOTE) The Xpert Xpress SARS-CoV-2/FLU/RSV assay is intended as an aid in  the diagnosis of influenza from Nasopharyngeal swab specimens and  should not be used as a sole basis for treatment. Nasal washings and  aspirates are unacceptable for Xpert Xpress SARS-CoV-2/FLU/RSV  testing. Fact Sheet for Patients: PinkCheek.be Fact Sheet for Healthcare Providers: GravelBags.it This test is not yet approved or cleared by the [place] FDA and  has been authorized for detection and/or diagnosis of SARS-CoV-2 by  FDA under an Emergency Use Authorization (EUA). This EUA will remain  in effect (meaning this test can be used) for the duration of the  Covid-19 declaration under Section 564(b)(1) of the Act, 21  U.S.C. section 360bbb-3(b)(1), unless the authorization is  terminated or revoked. Performed at [facility], [address] [place], [place] [address]   CULTURE, BLOOD (ROUTINE X 2) w Reflex to ID Panel     Status: None (Preliminary result)   Collection Time: 11/26/19 10:51 AM   Specimen: BLOOD  Result Value Ref Range Status   Specimen  Description BLOOD RIGHT ANTECUBITAL  Final   Special Requests   Final    BOTTLES DRAWN AEROBIC AND ANAEROBIC Blood Culture adequate volume   Culture   Final    NO GROWTH < 24 HOURS Performed at [facility], [address] [place], [place] [address]    Report Status PENDING  Incomplete  CULTURE, BLOOD (ROUTINE X 2) w Reflex to ID Panel     Status: None (Preliminary result)   Collection Time: 11/26/19 10:51 AM   Specimen: BLOOD  Result Value  Ref Range Status   Specimen Description BLOOD BLOOD LEFT FOREARM  Final   Special Requests   Final    BOTTLES DRAWN AEROBIC AND ANAEROBIC Blood Culture adequate volume   Culture   Final    NO GROWTH < 24 HOURS Performed at [facility], [address] [place], [place] [address]    Report Status PENDING  Incomplete         Radiology Studies: EEG  Result Date: 11/27/2019 Alexis Goodell, MD     11/27/2019  3:47 PM ELECTROENCEPHALOGRAM REPORT Patient: [name]       Room #: IC18A-AA EEG No. ID: [id number] Age: 84 y.o.        Sex: male Referring Physician: Jimmye Norman Report Date:  11/27/2019       Interpreting Physician: Alexis Goodell History: KENLY XIAO is an 84 y.o. male with altered mental status Medications: Lorazepam, Zithromax, Rocephin, Insulin Conditions of Recording:  This is a 21 channel routine scalp EEG performed with bipolar and monopolar montages arranged in accordance to the international 10/20 system of electrode placement. One channel was dedicated to EKG recording. The patient is in the altered state. Description:  The background activity is low voltage, slow and poorly organized resembling that of drowse with a mixture of theta and delta activity noted.  This activity is continuous and diffusely distributed.   No epileptiform activity is noted.  Hyperventilation and intermittent photic stimulation were not performed. IMPRESSION: This electroencephalogram is characterized by slowing.  Although this  slowing may represent drowse, can not rule out slowing related to a diffuse cerebral disturbance such as a metabolic encephalopathy.  Clinical correlation recommended.  No epileptiform activity is noted.  Alexis Goodell, MD Neurology [phone] 11/27/2019, 3:42 PM   US Carotid Bilateral (at [facility] and [facility] only)  Result Date: 11/26/2019 CLINICAL DATA:  TIA. History of CABG, hypertension, hyperlipidemia and diabetes. Former smoker. EXAM: BILATERAL CAROTID DUPLEX ULTRASOUND TECHNIQUE: Pearline Cables scale imaging, color Doppler and duplex ultrasound were performed of bilateral carotid and vertebral arteries in the neck. COMPARISON:  Carotid Doppler ultrasound-12/02/2016 FINDINGS: Examination is degraded secondary to patient's inability to tolerate positioning necessary for the ultrasound. Criteria: Quantification of carotid stenosis is based on velocity parameters that correlate the residual internal carotid diameter with NASCET-based stenosis levels, using the diameter of the distal internal carotid lumen as the denominator for stenosis measurement. The following velocity measurements were obtained: RIGHT ICA: 71/20 cm/sec CCA: 51/0 cm/sec SYSTOLIC ICA/CCA RATIO:  1.5 ECA: 306 cm/sec LEFT ICA: 78/20 cm/sec CCA: 25/85 cm/sec SYSTOLIC ICA/CCA RATIO:  0.8 ECA: 105 cm/sec RIGHT CAROTID ARTERY: There is a moderate amount of eccentric mixed echogenic plaque within the right carotid bulb (image 24), extending to involve the origin and proximal aspects of the right internal carotid artery (image 33, morphologically similar to the 11/2016 examination, and again not resulting in elevated peak systolic velocities within the interrogated course of the right internal carotid artery to suggest a hemodynamically significant stenosis. RIGHT VERTEBRAL ARTERY:  Antegrade Flow LEFT CAROTID ARTERY: There is a minimal amount of intimal thickening/atherosclerotic plaque involving the mid and distal aspects of the left common carotid artery as  well as the left carotid bulb, morphologically similar to the 11/2016 examination, and again not resulting in elevated peak systolic velocities within the interrogated course of the left internal carotid artery to suggest a hemodynamically significant stenosis. LEFT VERTEBRAL ARTERY:  Antegrade flow IMPRESSION: Minimal to moderate amount of bilateral atherosclerotic plaque, right greater than left, morphologically  similar to the 11/2016 examination, and again not resulting in elevated peak systolic velocities within either internal carotid artery to suggest a hemodynamically significant stenosis. Electronically Signed   By: Sandi Mariscal M.D.   On: 11/26/2019 16:15   DG Chest Port 1 View  Result Date: 11/27/2019 CLINICAL DATA:  Tachypnea EXAM: PORTABLE CHEST 1 VIEW COMPARISON:  Chest radiograph 11/26/2019 FINDINGS: Mild cardiomegaly, unchanged. Aortic atherosclerosis. Bilateral interstitial prominence is similar to prior examination. The left lateral costophrenic angle is incompletely included in the field of view, but there is suggestion of a small left pleural effusion. No evidence of pneumothorax. No acute bony abnormality. Overlying cardiac monitoring leads. IMPRESSION: Bilateral interstitial prominence may reflect interstitial edema. Atypical/viral pneumonia cannot be excluded. A small left pleural effusion is questioned. Stable mild cardiomegaly.  Aortic atherosclerosis. Electronically Signed   By: Kellie Simmering DO   On: 11/27/2019 09:28   DG Chest Port 1 View  Result Date: 11/26/2019 CLINICAL DATA:  Altered mental status. EXAM: PORTABLE CHEST 1 VIEW COMPARISON:  11/25/2019 FINDINGS: Mild cardiac enlargement. No pleural effusion or edema. No airspace opacities identified. The visualized osseous structures are unremarkable. IMPRESSION: 1. No acute cardiopulmonary abnormalities. 2. Mild cardiac enlargement. Electronically Signed   By: Kerby Moors M.D.   On: 11/26/2019 10:41   ECHOCARDIOGRAM  COMPLETE  Result Date: 11/27/2019   ECHOCARDIOGRAM REPORT   Patient Name:   GEORG ANG Date of Exam: 11/26/2019 Medical Rec #:  [id number]         Height:       70.0 in Accession #:    [id number]        Weight:       200.6 lb Date of Birth:  1935/03/09         BSA:          2.09 m Patient Age:    30 years          BP:           141/87 mmHg Patient Gender: M                 HR:           112 bpm. Exam Location:  [facility] Procedure: 2D Echo, Cardiac Doppler and Color Doppler Indications:     TIA 435.9  History:         Patient has prior history of Echocardiogram examinations. CAD;                  Risk Factors:Hypertension.  Sonographer:     Alyse Low Roar Referring Phys:  [place] Diagnosing Phys: Nelva Bush MD  Sonographer Comments: Technically difficult study due to poor echo windows. Image acquisition challenging due to patient behavioral factors. IMPRESSIONS  1. Left ventricular ejection fraction, by visual estimation, is 50 to 55%. The left ventricle has low normal function. There is mildly increased left ventricular hypertrophy.  2. Indeterminate diastolic filling due to E-A fusion.  3. Global right ventricle has low normal systolic function.The right ventricular size is normal. No increase in right ventricular wall thickness.  4. Pulmonary artery pressure is likely upper normal to mildly elevated (PASP 30 mmHg plus central venous pressure).  5. Left atrial size was normal.  6. Right atrial size was normal.  7. The mitral valve is grossly normal. No evidence of mitral valve regurgitation. No evidence of mitral stenosis.  8. The tricuspid valve is normal in structure.  9. Aortic valve regurgitation is mild to moderate.  10. The aortic valve is tricuspid. Aortic valve regurgitation is mild to moderate. Mild aortic valve stenosis. 11. Aortic regurgitation appears mild by color Doppler, though spectral Doppler envelope suggests more significant regurgitation. 12. The pulmonic valve was not  well visualized. Pulmonic valve regurgitation is not visualized. 13. The interatrial septum was not well visualized. FINDINGS  Left Ventricle: Left ventricular ejection fraction, by visual estimation, is 50 to 55%. The left ventricle has low normal function. The left ventricle is not well visualized. The left ventricular internal cavity size was the left ventricle is normal in size. There is mildly increased left ventricular hypertrophy. Indeterminate diastolic filling due to E-A fusion. Right Ventricle: The right ventricular size is normal. No increase in right ventricular wall thickness. Global RV systolic function is has low normal systolic function. Pulmonary artery pressure is likely upper normal to mildly elevated (PASP 30 mmHg plus central venous pressure). Left Atrium: Left atrial size was normal in size. Right Atrium: Right atrial size was normal in size Pericardium: There is no evidence of pericardial effusion. Mitral Valve: The mitral valve is grossly normal. No evidence of mitral valve regurgitation. No evidence of mitral valve stenosis by observation. Tricuspid Valve: The tricuspid valve is normal in structure. Tricuspid valve regurgitation mild-moderate. Aortic Valve: The aortic valve is tricuspid. . There is moderate thickening and mild calcification of the aortic valve. Aortic valve regurgitation is mild to moderate. Aortic regurgitation PHT measures 209 msec. Mild aortic stenosis is present. There is moderate thickening of the aortic valve. There is mild calcification of the aortic valve. Aortic valve mean gradient measures 17.5 mmHg. Aortic valve peak gradient measures 30.6 mmHg. Aortic valve area, by VTI measures 1.32 cm. Aortic regurgitation appears mild by color Doppler, though spectral Doppler envelope suggests more significant regurgitation. Pulmonic Valve: The pulmonic valve was not well visualized. Pulmonic valve regurgitation is not visualized. Pulmonic regurgitation is not visualized. No  evidence of pulmonic stenosis. Aorta: The aortic root is normal in size and structure. Pulmonary Artery: The pulmonary artery is not well seen. Venous: The inferior vena cava was not well visualized. IAS/Shunts: The interatrial septum was not well visualized.  LEFT VENTRICLE PLAX 2D LVIDd:         4.89 cm  Diastology LVIDs:         3.96 cm  LV e' lateral:   5.00 cm/s LV PW:         1.24 cm  LV E/e' lateral: 25.2 LV IVS:        1.34 cm  LV e' medial:    4.90 cm/s LVOT diam:     2.10 cm  LV E/e' medial:  25.7 LV SV:         44 ml LV SV Index:   20.53 LVOT Area:     3.46 cm  RIGHT VENTRICLE RV S prime:     16.80 cm/s LEFT ATRIUM           Index       RIGHT ATRIUM           Index LA diam:      3.20 cm 1.53 cm/m  RA Area:     10.10 cm LA Vol (A2C): 60.6 ml 28.99 ml/m RA Volume:   18.90 ml  9.04 ml/m LA Vol (A4C): 15.5 ml 7.42 ml/m  AORTIC VALVE                    PULMONIC VALVE AV Area (Vmax):    1.43  cm     PV Vmax:        0.96 m/s AV Area (Vmean):   1.35 cm     PV Peak grad:   3.7 mmHg AV Area (VTI):     1.32 cm     RVOT Peak grad: 2 mmHg AV Vmax:           276.50 cm/s AV Vmean:          196.500 cm/s AV VTI:            0.421 m AV Peak Grad:      30.6 mmHg AV Mean Grad:      17.5 mmHg LVOT Vmax:         114.00 cm/s LVOT Vmean:        76.700 cm/s LVOT VTI:          0.160 m LVOT/AV VTI ratio: 0.38 AI PHT:            209 msec  AORTA Ao Root diam: 2.70 cm MITRAL VALVE                        TRICUSPID VALVE MV Area (PHT): 6.65 cm             TR Peak grad:   30.5 mmHg MV PHT:        33.06 msec           TR Vmax:        276.00 cm/s MV Decel Time: 114 msec MV E velocity: 126.00 cm/s 103 cm/s SHUNTS                                     Systemic VTI:  0.16 m                                     Systemic Diam: 2.10 cm  Nelva Bush MD Electronically signed by Nelva Bush MD Signature Date/Time: 11/27/2019/7:29:06 AM    Final         Scheduled Meds: .  stroke: mapping our early stages of recovery book   Does  not apply Once  . Chlorhexidine Gluconate Cloth  6 each Topical Daily  . enoxaparin (LOVENOX) injection  40 mg Subcutaneous Q24H  . insulin aspart  0-15 Units Subcutaneous Q4H  . insulin glargine  10 Units Subcutaneous QHS  . ipratropium-albuterol  3 mL Nebulization Once  . ipratropium-albuterol  3 mL Nebulization Q6H  . mouth rinse  15 mL Mouth Rinse BID   Continuous Infusions: . sodium chloride Stopped (11/27/19 0820)  . azithromycin Stopped (11/27/19 1405)  . cefTRIAXone (ROCEPHIN)  IV Stopped (11/27/19 1436)     LOS: 2 days    Time spent: 30 mins    Wyvonnia Dusky, MD [facility] Pager [phone]  If 7PM-7AM, please contact night-coverage [email or web address] Password [email or web address] 11/28/2019, 7:52 AM

## 2019-11-28 NOTE — TOC Progression Note (Signed)
Transition of Care [facility]) - Progression Note    Patient Details  Name: Brett [name] MRN: [id number] Date of Birth: Oct 21, 1935  Transition of Care [facility]) CM/SW [place], RN Phone Number: 11/28/2019, 12:11 PM  Clinical Narrative:    Spoke with the [place] and he gives permission to start a bed search for SNF, I encouraged him to apply for Medicaid for Long term care. He will go online to apply I explained if the patient has behaviors it will make it difficult to find a bed.  So far now there is no behaviors        Expected Discharge Plan and Services                                                 Social Determinants of Health (SDOH) Interventions    Readmission Risk Interventions No flowsheet data found.

## 2019-11-28 NOTE — Progress Notes (Addendum)
SLP Cancellation Note  Patient Details Name: Brett [name] MRN: [id number] DOB: 21-Oct-1935   Cancelled treatment:       Reason Eval/Treat Not Completed: Patient's level of consciousness;Patient not medically ready(chart reviewed; ST services will f/u tomorrow.). Recommend frequent oral care for hygiene and stimulation of swallowing.     [name], [place], CCC-SLP Alphia Behanna 11/28/2019, 5:30 PM

## 2019-11-28 NOTE — Progress Notes (Signed)
Subjective: Patient more conversant today.   Objective: Current vital signs: BP 121/78 (BP Location: Right Arm)   Pulse 98   Temp 99.3 F (37.4 C) (Oral)   Resp 18   Ht 5\' 8"  (1.727 m)   Wt 93.2 kg   SpO2 99%   BMI 31.24 kg/m  Vital signs in last 24 hours: Temp:  [98.5 F (36.9 C)-100.1 F (37.8 C)] 99.3 F (37.4 C) (12/30 0736) Pulse Rate:  [96-127] 98 (12/30 0736) Resp:  [18-38] 18 (12/30 0736) BP: (115-171)/(59-93) 121/78 (12/30 0736) SpO2:  [94 %-100 %] 99 % (12/30 0738)  Intake/Output from previous day: 12/29 0701 - 12/30 0700 In: 1070 [I.V.:387; IV Piggyback:683] Out: -  Intake/Output this shift: No intake/output data recorded. Nutritional status:  Diet Order            Diet NPO time specified  Diet effective now              Neurologic Exam: Mental Status: Patient alerted with light sternal rub.  Increased amount of speech.  Reports that he wants water and was not given any last evening and perseverates on being thirsty.  Does not follow commands.  Dysarthric.   Cranial Nerves: II: Questionably blinks to confrontation from the left.  Blinks reliably to confrontation from the right, pupils equal, round, reactive to light and accommodation III,IV, VI: ptosis not present, follows examiner around the room V,VII: mild left facial droop, facial light touch sensation normal bilaterally VIII: hearing normal bilaterally IX,X: gag reflex present XI: unable to test XII: unable to test Motor: Moves right upper extremity spontaneously and localizes to pain with the RUE.  Minimal movement noted of the LUE.  Withdraws both lower extremities.   Sensory: Responds to noxious stimuli in all extremities  Lab Results: Basic Metabolic Panel: Recent Labs  Lab 11/25/19 2204 11/26/19 0419 11/26/19 1728 11/27/19 0157 11/28/19 0617  NA 142 142 137 139 140  K 3.8 4.1 4.5 3.9 4.3  CL 105 105 103 104 102  CO2 24 25 24 24 24   GLUCOSE 238* 257* 341* 300* 189*  BUN 21 21  18 19 22   CREATININE 1.69* 1.54* 1.53* 1.62* 1.83*  CALCIUM 9.2 8.8* 8.4* 8.2* 8.0*  MG  --   --   --   --  2.0  PHOS  --   --   --   --  4.0    Liver Function Tests: Recent Labs  Lab 11/25/19 1450 11/26/19 1728 11/27/19 0157 11/28/19 0617  AST 62* 48* 46* 38  ALT 77* 61* 54* 44  ALKPHOS 119 102 98 86  BILITOT 0.7 0.8 0.7 0.8  PROT 6.7 6.2* 6.2* 5.8*  ALBUMIN 3.6 3.2* 3.2* 2.8*   No results for input(s): LIPASE, AMYLASE in the last 168 hours. Recent Labs  Lab 11/26/19 0058  AMMONIA 26    CBC: Recent Labs  Lab 11/25/19 1450 11/26/19 1728 11/27/19 0157 11/28/19 0617  WBC 6.2 8.3 7.1 7.7  NEUTROABS 4.6  --   --   --   HGB 10.7* 10.9* 10.4* 11.3*  HCT 32.1* 33.3* 31.4* 34.4*  MCV 83.4 83.5 83.1 83.5  PLT 163 156 161 191    Cardiac Enzymes: Recent Labs  Lab 11/25/19 1450  CKTOTAL 164    Lipid Panel: No results for input(s): CHOL, TRIG, HDL, CHOLHDL, VLDL, LDLCALC in the last 168 hours.  CBG: Recent Labs  Lab 11/27/19 1634 11/27/19 1952 11/27/19 2338 11/28/19 0308 11/28/19 0736  GLUCAP 216* 231*  212* 161* 194*    Microbiology: Results for orders placed or performed during the hospital encounter of 11/25/19  Urine Culture     Status: Abnormal   Collection Time: 11/25/19  2:37 PM   Specimen: Urine, Random  Result Value Ref Range Status   Specimen Description   Final    URINE, RANDOM Performed at [facility], [address] [place], [place] [address]    Special Requests   Final    NONE Performed at [facility], [address], [place], [place] [address]    Culture (A)  Final    <10,000 COLONIES/mL INSIGNIFICANT GROWTH Performed at [facility] [address], [place], [place] [address]    Report Status 11/27/2019 FINAL  Final  Respiratory Panel by RT PCR (Flu A&B, Covid) - Urine, Clean Catch     Status: None   Collection Time: 11/25/19  4:22 PM   Specimen: Urine, Clean Catch  Result Value Ref Range Status    SARS Coronavirus 2 by RT PCR NEGATIVE NEGATIVE Final    Comment: (NOTE) SARS-CoV-2 target nucleic acids are NOT DETECTED. The SARS-CoV-2 RNA is generally detectable in upper respiratoy specimens during the acute phase of infection. The lowest concentration of SARS-CoV-2 viral copies this assay can detect is 131 copies/mL. A negative result does not preclude SARS-Cov-2 infection and should not be used as the sole basis for treatment or other patient management decisions. A negative result may occur with  improper specimen collection/handling, submission of specimen other than nasopharyngeal swab, presence of viral mutation(s) within the areas targeted by this assay, and inadequate number of viral copies (<131 copies/mL). A negative result must be combined with clinical observations, patient history, and epidemiological information. The expected result is Negative. Fact Sheet for Patients:  PinkCheek.be Fact Sheet for Healthcare Providers:  GravelBags.it This test is not yet ap proved or cleared by the [place] FDA and  has been authorized for detection and/or diagnosis of SARS-CoV-2 by FDA under an Emergency Use Authorization (EUA). This EUA will remain  in effect (meaning this test can be used) for the duration of the COVID-19 declaration under Section 564(b)(1) of the Act, 21 U.S.C. section 360bbb-3(b)(1), unless the authorization is terminated or revoked sooner.    Influenza A by PCR NEGATIVE NEGATIVE Final   Influenza B by PCR NEGATIVE NEGATIVE Final    Comment: (NOTE) The Xpert Xpress SARS-CoV-2/FLU/RSV assay is intended as an aid in  the diagnosis of influenza from Nasopharyngeal swab specimens and  should not be used as a sole basis for treatment. Nasal washings and  aspirates are unacceptable for Xpert Xpress SARS-CoV-2/FLU/RSV  testing. Fact Sheet for Patients: PinkCheek.be Fact  Sheet for Healthcare Providers: GravelBags.it This test is not yet approved or cleared by the [place] FDA and  has been authorized for detection and/or diagnosis of SARS-CoV-2 by  FDA under an Emergency Use Authorization (EUA). This EUA will remain  in effect (meaning this test can be used) for the duration of the  Covid-19 declaration under Section 564(b)(1) of the Act, 21  U.S.C. section 360bbb-3(b)(1), unless the authorization is  terminated or revoked. Performed at [facility], [address] [place], [place] [address]   CULTURE, BLOOD (ROUTINE X 2) w Reflex to ID Panel     Status: None (Preliminary result)   Collection Time: 11/26/19 10:51 AM   Specimen: BLOOD  Result Value Ref Range Status   Specimen Description BLOOD RIGHT ANTECUBITAL  Final  Special Requests   Final    BOTTLES DRAWN AEROBIC AND ANAEROBIC Blood Culture adequate volume   Culture   Final    NO GROWTH < 24 HOURS Performed at [facility], [address] [place], [place] [address]    Report Status PENDING  Incomplete  CULTURE, BLOOD (ROUTINE X 2) w Reflex to ID Panel     Status: None (Preliminary result)   Collection Time: 11/26/19 10:51 AM   Specimen: BLOOD  Result Value Ref Range Status   Specimen Description BLOOD BLOOD LEFT FOREARM  Final   Special Requests   Final    BOTTLES DRAWN AEROBIC AND ANAEROBIC Blood Culture adequate volume   Culture   Final    NO GROWTH < 24 HOURS Performed at [facility], [address] [place], [place] [address]    Report Status PENDING  Incomplete    Coagulation Studies: No results for input(s): LABPROT, INR in the last 72 hours.  Imaging: EEG  Result Date: 11/27/2019 Alexis Goodell, MD     11/27/2019  3:47 PM ELECTROENCEPHALOGRAM REPORT Patient: Brett [name]       Room #: IC18A-AA EEG No. ID: [id number] Age: 84 y.o.        Sex: male Referring Physician: Jimmye Norman Report Date:  11/27/2019        Interpreting Physician: Alexis Goodell History: TYAIRE ODEM is an 84 y.o. male with altered mental status Medications: Lorazepam, Zithromax, Rocephin, Insulin Conditions of Recording:  This is a 21 channel routine scalp EEG performed with bipolar and monopolar montages arranged in accordance to the international 10/20 system of electrode placement. One channel was dedicated to EKG recording. The patient is in the altered state. Description:  The background activity is low voltage, slow and poorly organized resembling that of drowse with a mixture of theta and delta activity noted.  This activity is continuous and diffusely distributed.   No epileptiform activity is noted.  Hyperventilation and intermittent photic stimulation were not performed. IMPRESSION: This electroencephalogram is characterized by slowing.  Although this slowing may represent drowse, can not rule out slowing related to a diffuse cerebral disturbance such as a metabolic encephalopathy.  Clinical correlation recommended.  No epileptiform activity is noted.  Alexis Goodell, MD Neurology [phone] 11/27/2019, 3:42 PM   US Carotid Bilateral (at [facility] and [facility] only)  Result Date: 11/26/2019 CLINICAL DATA:  TIA. History of CABG, hypertension, hyperlipidemia and diabetes. Former smoker. EXAM: BILATERAL CAROTID DUPLEX ULTRASOUND TECHNIQUE: Pearline Cables scale imaging, color Doppler and duplex ultrasound were performed of bilateral carotid and vertebral arteries in the neck. COMPARISON:  Carotid Doppler ultrasound-12/02/2016 FINDINGS: Examination is degraded secondary to patient's inability to tolerate positioning necessary for the ultrasound. Criteria: Quantification of carotid stenosis is based on velocity parameters that correlate the residual internal carotid diameter with NASCET-based stenosis levels, using the diameter of the distal internal carotid lumen as the denominator for stenosis measurement. The following velocity measurements were  obtained: RIGHT ICA: 71/20 cm/sec CCA: 37/4 cm/sec SYSTOLIC ICA/CCA RATIO:  1.5 ECA: 306 cm/sec LEFT ICA: 78/20 cm/sec CCA: 82/70 cm/sec SYSTOLIC ICA/CCA RATIO:  0.8 ECA: 105 cm/sec RIGHT CAROTID ARTERY: There is a moderate amount of eccentric mixed echogenic plaque within the right carotid bulb (image 24), extending to involve the origin and proximal aspects of the right internal carotid artery (image 33, morphologically similar to the 11/2016 examination, and again not resulting in elevated peak systolic velocities within the interrogated course of the right internal carotid artery to suggest  a hemodynamically significant stenosis. RIGHT VERTEBRAL ARTERY:  Antegrade Flow LEFT CAROTID ARTERY: There is a minimal amount of intimal thickening/atherosclerotic plaque involving the mid and distal aspects of the left common carotid artery as well as the left carotid bulb, morphologically similar to the 11/2016 examination, and again not resulting in elevated peak systolic velocities within the interrogated course of the left internal carotid artery to suggest a hemodynamically significant stenosis. LEFT VERTEBRAL ARTERY:  Antegrade flow IMPRESSION: Minimal to moderate amount of bilateral atherosclerotic plaque, right greater than left, morphologically similar to the 11/2016 examination, and again not resulting in elevated peak systolic velocities within either internal carotid artery to suggest a hemodynamically significant stenosis. Electronically Signed   By: Sandi Mariscal M.D.   On: 11/26/2019 16:15   DG Chest Port 1 View  Result Date: 11/27/2019 CLINICAL DATA:  Tachypnea EXAM: PORTABLE CHEST 1 VIEW COMPARISON:  Chest radiograph 11/26/2019 FINDINGS: Mild cardiomegaly, unchanged. Aortic atherosclerosis. Bilateral interstitial prominence is similar to prior examination. The left lateral costophrenic angle is incompletely included in the field of view, but there is suggestion of a small left pleural effusion. No  evidence of pneumothorax. No acute bony abnormality. Overlying cardiac monitoring leads. IMPRESSION: Bilateral interstitial prominence may reflect interstitial edema. Atypical/viral pneumonia cannot be excluded. A small left pleural effusion is questioned. Stable mild cardiomegaly.  Aortic atherosclerosis. Electronically Signed   By: Kellie Simmering DO   On: 11/27/2019 09:28   ECHOCARDIOGRAM COMPLETE  Result Date: 11/27/2019   ECHOCARDIOGRAM REPORT   Patient Name:   Brett [name] Date of Exam: 11/26/2019 Medical Rec #:  [id number]         Height:       70.0 in Accession #:    [id number]        Weight:       200.6 lb Date of Birth:  Sep 08, 1935         BSA:          2.09 m Patient Age:    21 years          BP:           141/87 mmHg Patient Gender: M                 HR:           112 bpm. Exam Location:  [facility] Procedure: 2D Echo, Cardiac Doppler and Color Doppler Indications:     TIA 435.9  History:         Patient has prior history of Echocardiogram examinations. CAD;                  Risk Factors:Hypertension.  Sonographer:     Alyse Low Roar Referring Phys:  [place] Diagnosing Phys: Nelva Bush MD  Sonographer Comments: Technically difficult study due to poor echo windows. Image acquisition challenging due to patient behavioral factors. IMPRESSIONS  1. Left ventricular ejection fraction, by visual estimation, is 50 to 55%. The left ventricle has low normal function. There is mildly increased left ventricular hypertrophy.  2. Indeterminate diastolic filling due to E-A fusion.  3. Global right ventricle has low normal systolic function.The right ventricular size is normal. No increase in right ventricular wall thickness.  4. Pulmonary artery pressure is likely upper normal to mildly elevated (PASP 30 mmHg plus central venous pressure).  5. Left atrial size was normal.  6. Right atrial size was normal.  7. The mitral valve is grossly normal. No evidence of mitral valve  regurgitation. No evidence  of mitral stenosis.  8. The tricuspid valve is normal in structure.  9. Aortic valve regurgitation is mild to moderate. 10. The aortic valve is tricuspid. Aortic valve regurgitation is mild to moderate. Mild aortic valve stenosis. 11. Aortic regurgitation appears mild by color Doppler, though spectral Doppler envelope suggests more significant regurgitation. 12. The pulmonic valve was not well visualized. Pulmonic valve regurgitation is not visualized. 13. The interatrial septum was not well visualized. FINDINGS  Left Ventricle: Left ventricular ejection fraction, by visual estimation, is 50 to 55%. The left ventricle has low normal function. The left ventricle is not well visualized. The left ventricular internal cavity size was the left ventricle is normal in size. There is mildly increased left ventricular hypertrophy. Indeterminate diastolic filling due to E-A fusion. Right Ventricle: The right ventricular size is normal. No increase in right ventricular wall thickness. Global RV systolic function is has low normal systolic function. Pulmonary artery pressure is likely upper normal to mildly elevated (PASP 30 mmHg plus central venous pressure). Left Atrium: Left atrial size was normal in size. Right Atrium: Right atrial size was normal in size Pericardium: There is no evidence of pericardial effusion. Mitral Valve: The mitral valve is grossly normal. No evidence of mitral valve regurgitation. No evidence of mitral valve stenosis by observation. Tricuspid Valve: The tricuspid valve is normal in structure. Tricuspid valve regurgitation mild-moderate. Aortic Valve: The aortic valve is tricuspid. . There is moderate thickening and mild calcification of the aortic valve. Aortic valve regurgitation is mild to moderate. Aortic regurgitation PHT measures 209 msec. Mild aortic stenosis is present. There is moderate thickening of the aortic valve. There is mild calcification of the aortic valve. Aortic valve mean gradient  measures 17.5 mmHg. Aortic valve peak gradient measures 30.6 mmHg. Aortic valve area, by VTI measures 1.32 cm. Aortic regurgitation appears mild by color Doppler, though spectral Doppler envelope suggests more significant regurgitation. Pulmonic Valve: The pulmonic valve was not well visualized. Pulmonic valve regurgitation is not visualized. Pulmonic regurgitation is not visualized. No evidence of pulmonic stenosis. Aorta: The aortic root is normal in size and structure. Pulmonary Artery: The pulmonary artery is not well seen. Venous: The inferior vena cava was not well visualized. IAS/Shunts: The interatrial septum was not well visualized.  LEFT VENTRICLE PLAX 2D LVIDd:         4.89 cm  Diastology LVIDs:         3.96 cm  LV e' lateral:   5.00 cm/s LV PW:         1.24 cm  LV E/e' lateral: 25.2 LV IVS:        1.34 cm  LV e' medial:    4.90 cm/s LVOT diam:     2.10 cm  LV E/e' medial:  25.7 LV SV:         44 ml LV SV Index:   20.53 LVOT Area:     3.46 cm  RIGHT VENTRICLE RV S prime:     16.80 cm/s LEFT ATRIUM           Index       RIGHT ATRIUM           Index LA diam:      3.20 cm 1.53 cm/m  RA Area:     10.10 cm LA Vol (A2C): 60.6 ml 28.99 ml/m RA Volume:   18.90 ml  9.04 ml/m LA Vol (A4C): 15.5 ml 7.42 ml/m  AORTIC VALVE  PULMONIC VALVE AV Area (Vmax):    1.43 cm     PV Vmax:        0.96 m/s AV Area (Vmean):   1.35 cm     PV Peak grad:   3.7 mmHg AV Area (VTI):     1.32 cm     RVOT Peak grad: 2 mmHg AV Vmax:           276.50 cm/s AV Vmean:          196.500 cm/s AV VTI:            0.421 m AV Peak Grad:      30.6 mmHg AV Mean Grad:      17.5 mmHg LVOT Vmax:         114.00 cm/s LVOT Vmean:        76.700 cm/s LVOT VTI:          0.160 m LVOT/AV VTI ratio: 0.38 AI PHT:            209 msec  AORTA Ao Root diam: 2.70 cm MITRAL VALVE                        TRICUSPID VALVE MV Area (PHT): 6.65 cm             TR Peak grad:   30.5 mmHg MV PHT:        33.06 msec           TR Vmax:        276.00 cm/s  MV Decel Time: 114 msec MV E velocity: 126.00 cm/s 103 cm/s SHUNTS                                     Systemic VTI:  0.16 m                                     Systemic Diam: 2.10 cm  Nelva Bush MD Electronically signed by Nelva Bush MD Signature Date/Time: 11/27/2019/7:29:06 AM    Final     Medications:  I have reviewed the patient's current medications. Scheduled: .  stroke: mapping our early stages of recovery book   Does not apply Once  . Chlorhexidine Gluconate Cloth  6 each Topical Daily  . enoxaparin (LOVENOX) injection  40 mg Subcutaneous Q24H  . insulin aspart  0-15 Units Subcutaneous Q4H  . insulin glargine  10 Units Subcutaneous QHS  . ipratropium-albuterol  3 mL Nebulization Once  . ipratropium-albuterol  3 mL Nebulization Q6H  . mouth rinse  15 mL Mouth Rinse BID  . pantoprazole (PROTONIX) IV  40 mg Intravenous Q24H    Assessment/Plan: 84 y.o. male with a history of multiple medical problems admitted with altered mental status.  Etiology unclear.  MRI of the brain reviewed and shows no acute changes.  Patient with multiple metabolic issues though including elevated LFT's, elevated creatinine, hyperkalemia, hyponatremia and elevated blood sugars.  Patient also febrile and started on antibiotics.  Mental status likely multifactorial in etiology and related to above issues.  Although these issues are slowly improving it would not be unusual for the patient's cognitive status to lag behind improvement in the lab work.   Patient today improved from yesterday but still not at baseline.  Does have some mild focality on  neurological examination.  Afebrile.  EEG shows no epileptiform activity.  Recommendations: 1. Due to mild focality on neurological examination would repeat imaging.      LOS: 2 days   Alexis Goodell, MD Neurology [phone] 11/28/2019  12:37 PM

## 2019-11-28 NOTE — Progress Notes (Signed)
PT Cancellation Note  Patient Details Name: ALANTE TOLAN MRN: [id number] DOB: [birth year]   Cancelled Treatment:    Reason Eval/Treat Not Completed: Patient's level of consciousness; Per nursing pt continues to present with significant AMS and is not currently appropriate for PT evaluation.  Will complete PT orders at this time but will reassess pt pending a change in status upon receipt of new PT orders.     Linus Salmons PT, DPT 11/28/19, 9:23 AM

## 2019-11-29 ENCOUNTER — Inpatient Hospital Stay: Payer: Medicare PPO

## 2019-11-29 DIAGNOSIS — I69991 Dysphagia following unspecified cerebrovascular disease: Secondary | ICD-10-CM

## 2019-11-29 LAB — CBC
HCT: 35.1 % — ABNORMAL LOW (ref 39.0–52.0)
Hemoglobin: 10.8 g/dL — ABNORMAL LOW (ref 13.0–17.0)
MCH: 27 pg (ref 26.0–34.0)
MCHC: 30.8 g/dL (ref 30.0–36.0)
MCV: 87.8 fL (ref 80.0–100.0)
Platelets: 186 10*3/uL (ref 150–400)
RBC: 4 MIL/uL — ABNORMAL LOW (ref 4.22–5.81)
RDW: 14.9 % (ref 11.5–15.5)
WBC: 7 10*3/uL (ref 4.0–10.5)
nRBC: 0 % (ref 0.0–0.2)

## 2019-11-29 LAB — BASIC METABOLIC PANEL
Anion gap: 12 (ref 5–15)
BUN: 33 mg/dL — ABNORMAL HIGH (ref 8–23)
CO2: 27 mmol/L (ref 22–32)
Calcium: 7.9 mg/dL — ABNORMAL LOW (ref 8.9–10.3)
Chloride: 104 mmol/L (ref 98–111)
Creatinine, Ser: 1.98 mg/dL — ABNORMAL HIGH (ref 0.61–1.24)
GFR calc Af Amer: 35 mL/min — ABNORMAL LOW (ref >60)
GFR calc non Af Amer: 31 mL/min — ABNORMAL LOW (ref >60)
Glucose, Bld: 153 mg/dL — ABNORMAL HIGH (ref 70–99)
Potassium: 3.7 mmol/L (ref 3.5–5.1)
Sodium: 143 mmol/L (ref 135–145)

## 2019-11-29 LAB — GLUCOSE, CAPILLARY
Glucose-Capillary: 166 mg/dL — ABNORMAL HIGH (ref 70–99)
Glucose-Capillary: 243 mg/dL — ABNORMAL HIGH (ref 70–99)
Glucose-Capillary: 246 mg/dL — ABNORMAL HIGH (ref 70–99)
Glucose-Capillary: 257 mg/dL — ABNORMAL HIGH (ref 70–99)

## 2019-11-29 LAB — MAGNESIUM: Magnesium: 2.4 mg/dL (ref 1.7–2.4)

## 2019-11-29 LAB — MYCOPLASMA PNEUMONIAE ANTIBODY, IGM: Mycoplasma pneumo IgM: <770 U/mL (ref 0–769)

## 2019-11-29 LAB — PHOSPHORUS: Phosphorus: 4.1 mg/dL (ref 2.5–4.6)

## 2019-11-29 MED ORDER — JEVITY 1.2 CAL PO LIQD
1000.0000 mL | ORAL | Status: DC
  Administered 2019-11-29 – 2019-12-02 (×4): 1000 mL

## 2019-11-29 MED ORDER — PRO-STAT SUGAR FREE PO LIQD
30.0000 mL | Freq: Every day | ORAL | Status: DC
  Administered 2019-11-29 – 2019-12-04 (×6): 30 mL

## 2019-11-29 MED ORDER — FREE WATER
30.0000 mL | Status: DC
  Administered 2019-11-30 – 2019-12-03 (×18): 30 mL

## 2019-11-29 MED ORDER — IPRATROPIUM-ALBUTEROL 0.5-2.5 (3) MG/3ML IN SOLN
3.0000 mL | Freq: Three times a day (TID) | RESPIRATORY_TRACT | Status: DC
  Administered 2019-11-30 – 2019-12-04 (×9): 3 mL via RESPIRATORY_TRACT
  Filled 2019-11-29 (×10): qty 3

## 2019-11-29 NOTE — Care Management Important Message (Signed)
Important Message  Patient Details  Name: Brett [name] MRN: [id number] Date of Birth: 23-Jul-1935   Medicare Important Message Given:  Other (see comment)  Tried calling POA, Claudette Head [phone]) but his mailbox was not set up so was unable to leave a message.  Copy of Important Message left in patients room.     Juliann Pulse A Aser Nylund 11/29/2019, 11:33 AM

## 2019-11-29 NOTE — Progress Notes (Signed)
Nutrition Follow-up  DOCUMENTATION CODES:   Obesity unspecified  INTERVENTION:  Once enteral access placed and confirmed initiate Jevity 1.2 Cal at 25 mL/hr and advance by 15 mL/hr every 8 hours to goal rate of 70 mL/hr. Also provide Pro-Stat 30 mL daily per tube. Provides 2116 kcal, 108 grams of protein, 1361 mL H2O daily.  Provide minimum free water flush of 30 mL Q4hrs to maintain tube patency. This will provide a total of 1541 mL H2O daily including water in tube feeding.  NUTRITION DIAGNOSIS:   Inadequate oral intake related to inability to eat as evidenced by NPO status.  Ongoing - addressing with initiation of tube feeds.  GOAL:   Patient will meet greater than or equal to 90% of their needs  Progressing with initiation of tube feeds.  MONITOR:   Diet advancement, PO intake, Labs, Weight trends, I & O's  REASON FOR ASSESSMENT:   Consult Malnutrition Eval, Enteral/tube feeding initiation and management  ASSESSMENT:   84 year old male with PMHx of HTN, HLD, DM, CAD, B-cell lymphoma, PUD, emphysema of lung, COPD, CHF admitted with acute metabolic encephalopathy, sepsis, acute hypoxic respiratory failure, AKI.  Patient was started on dysphagia 1 diet with honey-thick liquids today following SLP evaluation. However, patient can only eat when fully awake and will only eat teaspoons at a time. Plan is for placement of NGT for initiation of tube feeds today to meet calorie/protein needs. When RD went to assess patient he was awake and sitting up in bed but confused. He kept asking for something but could not clarify what he was asking for. Able to complete NFPE today.  Medications reviewed and include: Novolog 0-15 units Q4hrs, Lantus 10 units daily, pantoprazole, azithromycin, ceftriaxone.  Labs reviewed: CBG 166-248, BUN 33, Creatinine 1.98.  Nutrition-Focused Physical Exam Findings:   Most Recent Value  Orbital Region  No depletion  Upper Arm Region  No depletion   Thoracic and Lumbar Region  No depletion  Buccal Region  No depletion  Temple Region  No depletion  Clavicle Bone Region  No depletion  Clavicle and Acromion Bone Region  No depletion  Scapular Bone Region  Unable to assess  Dorsal Hand  No depletion  Patellar Region  No depletion  Anterior Thigh Region  No depletion  Posterior Calf Region  Mild depletion  Edema (RD Assessment)  None  Hair  Reviewed  Eyes  Unable to assess  Mouth  Unable to assess  Skin  Reviewed  Nails  Reviewed     Diet Order:   Diet Order            DIET - DYS 1 Room service appropriate? Yes with Assist; Fluid consistency: Honey Thick  Diet effective now             EDUCATION NEEDS:   No education needs have been identified at this time  Skin:  Skin Assessment: Reviewed RN Assessment  Last BM:  11/26/2019 per chart  Height:   Ht Readings from Last 1 Encounters:  11/27/19 5\' 8"  (1.727 m)   Weight:   Wt Readings from Last 1 Encounters:  11/27/19 93.2 kg   Ideal Body Weight:  70 kg  BMI:  Body mass index is 31.24 kg/m.  Estimated Nutritional Needs:   Kcal:  2000-2200  Protein:  100-110 grams  Fluid:  1.8-2 L/day  Jacklynn Barnacle, MS, RD, LDN Office: [phone] Pager: [phone] After Hours/Weekend Pager: [phone]

## 2019-11-29 NOTE — Progress Notes (Signed)
Incentive Spirometry provided, however, due to patient's altered mental status, inability to follow proper commands and mits on hands patient is unable to perform IS at this time.

## 2019-11-29 NOTE — Care Management Important Message (Signed)
Important Message  Patient Details  Name: Brett [name] MRN: [id number] Date of Birth: 1934-12-28   Medicare Important Message Given:  Yes  Brett [name] [facility]) returned my call and I reviewed the Important Message from Medicare with him for Brett [name].  He said he understood those rights and thanked me for calling.   [name] 11/29/2019, 12:21 PM

## 2019-11-29 NOTE — Progress Notes (Signed)
PROGRESS NOTE    Brett [name]  PYK:[id number] DOB: 06-Oct-1935 DOA: 11/25/2019 PCP: Perrin Maltese, MD      Assessment & Plan:   Active Problems:   B-cell lymphoma (HCC)   COPD (chronic obstructive pulmonary disease) (HCC)   Sepsis (HCC)   HTN (hypertension)   Diabetes (HCC)   CAD (coronary artery disease)   Chronic diastolic heart failure (HCC)   Stroke (HCC)   COPD exacerbation (HCC)   Type 2 diabetes mellitus with hyperosmolar nonketotic hyperglycemia (HCC)   AF (paroxysmal atrial fibrillation) (HCC)   Acute encephalopathy   High serum osmolar gap   Possibly acute metabolic encephalopathy: improved today, as pt is asking for water. Far from pt's baseline as per pt's MPOA, pt is normally alert & oriented & able to ambulate independently. MPOA is pt's friend [name]. CT brain & MRI brain showed no acute abnormalities. Repeat CT scan shows no acute abnormalities. PT/OT consulted but unable to complete at this time. EEG shows slowing but no epileptiform activity. Neuro following and recs apprec. Speech following and recs apprec   Dysphagia: NPO. Will place NG tube and start tube feeds   Sepsis: etiology unclear. Blood cxs NGTD & urine cx pending. Tylenol prn for fevers. Will continue on IV azithromycin & ceftriaxone.  Possible pneumonia: possible atypical or gram positive. Strep, mycoplasma is neg. Legionella pending. Continue on IV ceftriaxone and azithromycin. Continue on supplemental oxygen and wean as tolerated. Nebs prn   Acute hypoxic respiratory failure: etiology unclear, possible CHF exacerbation and/or pneumonia. Repeat CXR shows edema and possible pneumonia. IV lasix x1 given. Continue on IV abxs. Continue on supplemental oxygen and wean as tolerated.  Acute on chronic diastolic heart failure: will hold off on lasix today secondary to worsening AKI. Monitor I/Os.   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 Cr is trending up daily. Avoid nephrotoxic  meds. Will consult nephro if Cr continues to increase. Will continue to monitor    B-cell lymphoma: chronic & currently in remission  COPD: continue nebs, incentive spirometry. VBG obtained no evidence of CO2 retention to explain mental status changes.  HTN: IV hydralazine prn   CAD: chronic & stable. When able to tolerate p.o. resume home medications  DM2: poorly controlled. Continue on lantus and SSI w/ accuchecks   Possible PAF: IV metoprolol prn for HR >130. Patient apparently not anticoagulation he has amiodarone with his list of medication & his MPOA was unaware the patient has atrial fibrillation.   Elevated lactic acid: resolved. Etiology unclear.   Abdominal distention: etiology unclear. KUB & CT abd/pelvis shows no acute abnormailities   Transaminitis: etiology unclear. Resolved  Normocytic anemia: etiology unclear. No need for a transfusion at this time. Will continue to monitor   Right eye blindness: chronic as per pt's MPOA   DVT prophylaxis: lovenox Code Status: DNR Family Communication: Pt is estranged from his family who evidently lives in [place] and [name]. Discussed w/ Mr. Wynetta Emery outside the pt's room and answered all of his questions  Disposition Plan:   Consultants:  Neuro   Procedures: n/a   Antimicrobials: azithromycin, ceftriaxone    Subjective: Pt is more awake today and asking for water. Pt still does not answer questions appropriately.    Objective: Vitals:   11/28/19 0738 11/28/19 1927 11/28/19 2304 11/29/19 0303  BP:   (!) 147/63   Pulse:   99   Resp:   17   Temp:   98.4 F (36.9 C)  TempSrc:   Axillary   SpO2: 99% 98% 98% 95%  Weight:      Height:        Intake/Output Summary (Last 24 hours) at 11/29/2019 0717 Last data filed at 11/28/2019 1800 Gross per 24 hour  Intake 100 ml  Output 1 ml  Net 99 ml   Filed Weights   11/25/19 1430 11/27/19 0839  Weight: 91 kg 93.2 kg    Examination:  General exam:  Appears calm and comfortable  Respiratory system:  decreased breath sounds b/l. No rales Cardiovascular system: S1 & S2 +. No rubs, gallops or clicks. B/l LE  2+ pitting edema Gastrointestinal system: Abdomen is obese, soft and nontender.  normal bowel sounds heard. Central nervous system: Awake. Not following commands Psychiatry: Judgement and insight appear abnormal. Flat mood and affect      Data Reviewed: I have personally reviewed following labs and imaging studies  CBC: Recent Labs  Lab 11/25/19 1450 11/26/19 1728 11/27/19 0157 11/28/19 0617 11/29/19 0315  WBC 6.2 8.3 7.1 7.7 7.0  NEUTROABS 4.6  --   --   --   --   HGB 10.7* 10.9* 10.4* 11.3* 10.8*  HCT 32.1* 33.3* 31.4* 34.4* 35.1*  MCV 83.4 83.5 83.1 83.5 87.8  PLT 163 156 161 191 270   Basic Metabolic Panel: Recent Labs  Lab 11/26/19 0419 11/26/19 1728 11/27/19 0157 11/28/19 0617 11/29/19 0315  NA 142 137 139 140 143  K 4.1 4.5 3.9 4.3 3.7  CL 105 103 104 102 104  CO2 25 24 24 24 27   GLUCOSE 257* 341* 300* 189* 153*  BUN 21 18 19 22  33*  CREATININE 1.54* 1.53* 1.62* 1.83* 1.98*  CALCIUM 8.8* 8.4* 8.2* 8.0* 7.9*  MG  --   --   --  2.0 2.4  PHOS  --   --   --  4.0 4.1   GFR: Estimated Creatinine Clearance: 31.9 mL/min (A) (by C-G formula based on SCr of 1.98 mg/dL (H)). Liver Function Tests: Recent Labs  Lab 11/25/19 1450 11/26/19 1728 11/27/19 0157 11/28/19 0617  AST 62* 48* 46* 38  ALT 77* 61* 54* 44  ALKPHOS 119 102 98 86  BILITOT 0.7 0.8 0.7 0.8  PROT 6.7 6.2* 6.2* 5.8*  ALBUMIN 3.6 3.2* 3.2* 2.8*   No results for input(s): LIPASE, AMYLASE in the last 168 hours. Recent Labs  Lab 11/26/19 0058  AMMONIA 26   Coagulation Profile: No results for input(s): INR, PROTIME in the last 168 hours. Cardiac Enzymes: Recent Labs  Lab 11/25/19 1450  CKTOTAL 164   BNP (last 3 results) No results for input(s): PROBNP in the last 8760 hours. HbA1C: Recent Labs    11/27/19 0157  HGBA1C 12.4*    CBG: Recent Labs  Lab 11/28/19 0736 11/28/19 1250 11/28/19 1810 11/28/19 2040 11/29/19 0410  GLUCAP 194* 221* 231* 248* 166*   Lipid Profile: No results for input(s): CHOL, HDL, LDLCALC, TRIG, CHOLHDL, LDLDIRECT in the last 72 hours. Thyroid Function Tests: No results for input(s): TSH, T4TOTAL, FREET4, T3FREE, THYROIDAB in the last 72 hours. Anemia Panel: No results for input(s): VITAMINB12, FOLATE, FERRITIN, TIBC, IRON, RETICCTPCT in the last 72 hours. Sepsis Labs: Recent Labs  Lab 11/25/19 1450 11/25/19 2204 11/26/19 0058  LATICACIDVEN 3.7* 3.0* 1.9    Recent Results (from the past 240 hour(s))  Urine Culture     Status: Abnormal   Collection Time: 11/25/19  2:37 PM   Specimen: Urine, Random  Result Value Ref  Range Status   Specimen Description   Final    URINE, RANDOM Performed at [facility], [address], [place], [place] [address]    Special Requests   Final    NONE Performed at [facility], [address] [place], [place] [address]    Culture (A)  Final    <10,000 COLONIES/mL INSIGNIFICANT GROWTH Performed at [place] [address], [place], [place] [address]    Report Status 11/27/2019 FINAL  Final  Respiratory Panel by RT PCR (Flu A&B, Covid) - Urine, Clean Catch     Status: None   Collection Time: 11/25/19  4:22 PM   Specimen: Urine, Clean Catch  Result Value Ref Range Status   SARS Coronavirus 2 by RT PCR NEGATIVE NEGATIVE Final    Comment: (NOTE) SARS-CoV-2 target nucleic acids are NOT DETECTED. The SARS-CoV-2 RNA is generally detectable in upper respiratoy specimens during the acute phase of infection. The lowest concentration of SARS-CoV-2 viral copies this assay can detect is 131 copies/mL. A negative result does not preclude SARS-Cov-2 infection and should not be used as the sole basis for treatment or other patient management decisions. A negative result may occur with  improper specimen  collection/handling, submission of specimen other than nasopharyngeal swab, presence of viral mutation(s) within the areas targeted by this assay, and inadequate number of viral copies (<131 copies/mL). A negative result must be combined with clinical observations, patient history, and epidemiological information. The expected result is Negative. Fact Sheet for Patients:  PinkCheek.be Fact Sheet for Healthcare Providers:  GravelBags.it This test is not yet ap proved or cleared by the [place] FDA and  has been authorized for detection and/or diagnosis of SARS-CoV-2 by FDA under an Emergency Use Authorization (EUA). This EUA will remain  in effect (meaning this test can be used) for the duration of the COVID-19 declaration under Section 564(b)(1) of the Act, 21 U.S.C. section 360bbb-3(b)(1), unless the authorization is terminated or revoked sooner.    Influenza A by PCR NEGATIVE NEGATIVE Final   Influenza B by PCR NEGATIVE NEGATIVE Final    Comment: (NOTE) The Xpert Xpress SARS-CoV-2/FLU/RSV assay is intended as an aid in  the diagnosis of influenza from Nasopharyngeal swab specimens and  should not be used as a sole basis for treatment. Nasal washings and  aspirates are unacceptable for Xpert Xpress SARS-CoV-2/FLU/RSV  testing. Fact Sheet for Patients: PinkCheek.be Fact Sheet for Healthcare Providers: GravelBags.it This test is not yet approved or cleared by the [place] FDA and  has been authorized for detection and/or diagnosis of SARS-CoV-2 by  FDA under an Emergency Use Authorization (EUA). This EUA will remain  in effect (meaning this test can be used) for the duration of the  Covid-19 declaration under Section 564(b)(1) of the Act, 21  U.S.C. section 360bbb-3(b)(1), unless the authorization is  terminated or revoked. Performed at [facility],  [address] [place], [place] [address]   CULTURE, BLOOD (ROUTINE X 2) w Reflex to ID Panel     Status: None (Preliminary result)   Collection Time: 11/26/19 10:51 AM   Specimen: BLOOD  Result Value Ref Range Status   Specimen Description BLOOD RIGHT ANTECUBITAL  Final   Special Requests   Final    BOTTLES DRAWN AEROBIC AND ANAEROBIC Blood Culture adequate volume   Culture   Final    NO GROWTH < 24 HOURS Performed at [facility], [address], [place],  [address]  Report Status PENDING  Incomplete  CULTURE, BLOOD (ROUTINE X 2) w Reflex to ID Panel     Status: None (Preliminary result)   Collection Time: 11/26/19 10:51 AM   Specimen: BLOOD  Result Value Ref Range Status   Specimen Description BLOOD BLOOD LEFT FOREARM  Final   Special Requests   Final    BOTTLES DRAWN AEROBIC AND ANAEROBIC Blood Culture adequate volume   Culture   Final    NO GROWTH < 24 HOURS Performed at [facility], [address], [place], [place] [address]    Report Status PENDING  Incomplete  Urine Culture     Status: None   Collection Time: 11/27/19  3:53 PM   Specimen: Urine, Random  Result Value Ref Range Status   Specimen Description   Final    URINE, RANDOM Performed at [facility], [address], [place], [place] [address]    Special Requests   Final    NONE Performed at [facility], [address], [place], [place] [address]    Culture   Final    NO GROWTH Performed at [facility], [address], [place], [place] [address]    Report Status 11/28/2019 FINAL  Final  MRSA PCR Screening     Status: None   Collection Time: 11/27/19  3:55 PM   Specimen: Nasopharyngeal  Result Value Ref Range Status   MRSA by PCR NEGATIVE NEGATIVE Final    Comment:        The GeneXpert MRSA Assay (FDA approved for NASAL specimens only), is one component of a comprehensive MRSA colonization surveillance program. It is not intended to diagnose  MRSA infection nor to guide or monitor treatment for MRSA infections. Performed at [facility], [address] [place],  [address]          Radiology Studies: EEG  Result Date: 11/27/2019 Alexis Goodell, MD     11/27/2019  3:47 PM ELECTROENCEPHALOGRAM REPORT Patient: JERMEY CLOSS       Room #: IC18A-AA EEG No. ID: [id number] Age: 84 y.o.        Sex: male Referring Physician: Jimmye Norman Report Date:  11/27/2019       Interpreting Physician: Alexis Goodell History: MARKEVIOUS EHMKE is an 84 y.o. male with altered mental status Medications: Lorazepam, Zithromax, Rocephin, Insulin Conditions of Recording:  This is a 21 channel routine scalp EEG performed with bipolar and monopolar montages arranged in accordance to the international 10/20 system of electrode placement. One channel was dedicated to EKG recording. The patient is in the altered state. Description:  The background activity is low voltage, slow and poorly organized resembling that of drowse with a mixture of theta and delta activity noted.  This activity is continuous and diffusely distributed.   No epileptiform activity is noted.  Hyperventilation and intermittent photic stimulation were not performed. IMPRESSION: This electroencephalogram is characterized by slowing.  Although this slowing may represent drowse, can not rule out slowing related to a diffuse cerebral disturbance such as a metabolic encephalopathy.  Clinical correlation recommended.  No epileptiform activity is noted.  Alexis Goodell, MD Neurology [phone] 11/27/2019, 3:42 PM   CT HEAD WO CONTRAST  Result Date: 11/28/2019 CLINICAL DATA:  Altered mental status EXAM: CT HEAD WITHOUT CONTRAST TECHNIQUE: Contiguous axial images were obtained from the base of the skull through the vertex without intravenous contrast. COMPARISON:  11/25/2019 FINDINGS: Brain: There is no acute intracranial hemorrhage, mass-effect, or edema. No new  loss of gray-white  differentiation. There is no extra-axial fluid collection. Ventricles and sulci are stable in size and configuration. Patchy hypoattenuation in the supratentorial white matter likely reflects stable chronic microvascular ischemic changes. 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 Vascular: There is atherosclerotic calcification at the skull base. Skull: Calvarium is unremarkable. Sinuses/Orbits: Minor mucosal thickening.  No new orbital finding. Other: None. IMPRESSION: No acute abnormality or substantial change since 11/25/2019. Chronic findings detailed above. Electronically Signed   By: Macy Mis M.D.   On: 11/28/2019 15:38   DG Chest Port 1 View  Result Date: 11/27/2019 CLINICAL DATA:  Tachypnea EXAM: PORTABLE CHEST 1 VIEW COMPARISON:  Chest radiograph 11/26/2019 FINDINGS: Mild cardiomegaly, unchanged. Aortic atherosclerosis. Bilateral interstitial prominence is similar to prior examination. The left lateral costophrenic angle is incompletely included in the field of view, but there is suggestion of a small left pleural effusion. No evidence of pneumothorax. No acute bony abnormality. Overlying cardiac monitoring leads. IMPRESSION: Bilateral interstitial prominence may reflect interstitial edema. Atypical/viral pneumonia cannot be excluded. A small left pleural effusion is questioned. Stable mild cardiomegaly.  Aortic atherosclerosis. Electronically Signed   By: Kellie Simmering DO   On: 11/27/2019 09:28        Scheduled Meds: .  stroke: mapping our early stages of recovery book   Does not apply Once  . enoxaparin (LOVENOX) injection  40 mg Subcutaneous Q24H  . insulin aspart  0-15 Units Subcutaneous Q4H  . insulin glargine  10 Units Subcutaneous QHS  . ipratropium-albuterol  3 mL Nebulization Once  . ipratropium-albuterol  3 mL Nebulization Q6H  . mouth rinse  15 mL Mouth Rinse BID  . pantoprazole (PROTONIX) IV  40 mg Intravenous Q24H   Continuous Infusions: . sodium  chloride Stopped (11/27/19 0820)  . azithromycin 500 mg (11/28/19 1734)  . cefTRIAXone (ROCEPHIN)  IV 1 g (11/28/19 1825)     LOS: 3 days    Time spent: 35 mins    Wyvonnia Dusky, MD [facility] Pager [phone]  If 7PM-7AM, please contact night-coverage [email or web address] Password [email or web address] 11/29/2019, 7:17 AM

## 2019-11-29 NOTE — Evaluation (Signed)
Clinical/Bedside Swallow Evaluation Patient Details  Name: Brett [name] MRN: [id number] Date of Birth: 01/19/1935  Today's Date: 11/29/2019 Time: SLP Start Time (ACUTE ONLY): 0820 SLP Stop Time (ACUTE ONLY): 0920 SLP Time Calculation (min) (ACUTE ONLY): 60 min  Past Medical History:  Past Medical History:  Diagnosis Date  . Anemia   . Aortic regurgitation   . B-cell lymphoma ([place]) 2009   DX AT [facility]  . Bronchitis   . CAD (coronary artery disease)   . Carotid stenosis   . CHF (congestive heart failure) ([place])   . Diabetes mellitus without complication ([place])   . Diabetes mellitus, type 2 ([place])   . Emphysema of lung ([place])   . Essential hypertension   . History of chemotherapy   . Hyperlipidemia   . Hypertension   . IDA (iron deficiency anemia)   . Leg edema   . Meralgia paraesthetica   . Mitral regurgitation   . PUD (peptic ulcer disease)   . PVD (peripheral vascular disease) ([place])   . Tobacco abuse    Past Surgical History:  Past Surgical History:  Procedure Laterality Date  . CARDIAC CATHETERIZATION  08/15/2007  . CARDIAC CATHETERIZATION Right 07/13/2016   Procedure: Right/Left Heart Cath and Coronary Angiography;  Surgeon: Dionisio David, MD;  Location: [facility];  Service: Cardiovascular;  Laterality: Right;  . COLONOSCOPY  03/2013  . ESOPHAGOGASTRODUODENOSCOPY  03/2013   HPI:  Pt is a 84 y.o. male with past medical history including multiple medical issues of CAD, CHF, diabetes, COPD, emphysema, B-cell lymphoma, with frequent admissions for pneumonia, here with altered mental status.  History is limited secondary to aphasia.  Per report, the patient was found down in his house, confused, making nonsensical speech by a neighbor, who heard him banging on the wall.  I called and discussed with his friend, who is his point of contact, and states the last time that he was seen normal was before 9 PM yesterday.  He does not know when, but states that he called 9 the  patient did seem somewhat confused with mild slurring, though he was otherwise making sense.  He does not recall if the patient has been sick and states that patient did not voice any complaints about feeling unwell.  Remainder of history is limited secondary to confusion.  MD is unsure of pt's baseline Cognitive functioning.  CXR: "Bilateral interstitial prominence may reflect interstitial edema", "A small left pleural effusion is questioned".  MRI - Negative.    Assessment / Plan / Recommendation Clinical Impression  Pt presents w/ a calmer State after significant agitation/confusion; meds ongoing. He appears to present w/ oropharyngeal phase dysphagia hevaily impacted by his declined Cognitive status and decreased oral awareness/attention. This presentation can increase risk for aspiration, choking. Pt required full assistance w/ sitting upright and min-mod verbal/tactile/visual cues w/ TSP trials presented. During the oral phase, he exhibited increased oral phase time for lingual smacking during A-P transit and to fully clear. Time was required b/t trials. Oral clearing achieved after prolonged oral phase time. Food and liquid trials were alternated to aid oral clearing. During the pharyngeal phase, pt exhibited fairly adequate toleration of TSP trials of initially Nectar liquids, but exhibited overt coughing post prolonged oral phase time w/ a TSP trial. Pt was then given trials of Honey consistency liquids and purees w/ no further overt clinical s/s of aspiration noted; no decline in respiratory effort, or coughing noted during/post trials. OM exam was limited d/t pt's  Cognitive deficits/participation but lingual movements were adequate for bolus control; no anterior spillage noted. No unilateral weakness noted.  Recommend initiation of a dysphagia level 1 (PUREE) diet w/ HONEY consistency liquids; aspiration precautions; feeding support and Supervision at all meals. Pills Crushed in puree as able. NSG/MDs  updated. MD to monitor overall intake and meeting of nutritional needs as this is a concern at this time w/ pt's Cognitive presentation.  SLP Visit Diagnosis: Dysphagia, oropharyngeal phase (R13.12)    Aspiration Risk  Moderate aspiration risk;Risk for inadequate nutrition/hydration    Diet Recommendation  Dysphagia level 1 (puree) w/ HONEY consistency liquids VIA TSP; aspiration precautions; full feeding support and Supervision during meals -- reduce distractions  Medication Administration: Crushed with puree(as able for safer swallowing)    Other  Recommendations Recommended Consults: (Dietician f/u; Palliative Care for [place]) Oral Care Recommendations: Oral care BID;Oral care before and after PO;Staff/trained caregiver to provide oral care Other Recommendations: Order thickener from pharmacy;Prohibited food (jello, ice cream, thin soups);Remove water pitcher;Have oral suction available   Follow up Recommendations Skilled Nursing facility(TBD)      Frequency and Duration min 3x week  2 weeks       Prognosis Prognosis for Safe Diet Advancement: Guarded Barriers to Reach Goals: Cognitive deficits;Time post onset;Severity of deficits;Behavior      Swallow Study   General Date of Onset: 11/25/19 HPI: Pt is a 84 y.o. male with past medical history including multiple medical issues of CAD, CHF, diabetes, COPD, emphysema, B-cell lymphoma, with frequent admissions for pneumonia, here with altered mental status.  History is limited secondary to aphasia.  Per report, the patient was found down in his house, confused, making nonsensical speech by a neighbor, who heard him banging on the wall.  I called and discussed with his friend, who is his point of contact, and states the last time that he was seen normal was before 9 PM yesterday.  He does not know when, but states that he called 9 the patient did seem somewhat confused with mild slurring, though he was otherwise making sense.  He does not  recall if the patient has been sick and states that patient did not voice any complaints about feeling unwell.  Remainder of history is limited secondary to confusion.  MD is unsure of pt's baseline Cognitive functioning.  CXR: "Bilateral interstitial prominence may reflect interstitial edema", "A small left pleural effusion is questioned".  MRI - Negative.  Type of Study: Bedside Swallow Evaluation Previous Swallow Assessment: none reported Diet Prior to this Study: NPO Temperature Spikes Noted: No(wbc 7.0) Respiratory Status: Nasal cannula(2L) History of Recent Intubation: No Behavior/Cognition: Cooperative;Pleasant mood;Confused;Distractible;Requires cueing;Doesn't follow directions(Eyes opened, awake) Oral Cavity Assessment: Dry Oral Care Completed by SLP: Yes Oral Cavity - Dentition: Missing dentition Vision: (n/a) Self-Feeding Abilities: Total assist Patient Positioning: Upright in bed(needed full positioning) Baseline Vocal Quality: (Nonverbal) Volitional Cough: Cognitively unable to elicit Volitional Swallow: Unable to elicit    Oral/Motor/Sensory Function Overall Oral Motor/Sensory Function: Within functional limits(no unilateral weakness noted during movements)   Ice Chips Ice chips: Impaired Presentation: Spoon(3 trials) Oral Phase Impairments: Poor awareness of bolus Oral Phase Functional Implications: Prolonged oral transit Pharyngeal Phase Impairments: (none)   Thin Liquid Thin Liquid: Not tested    Nectar Thick Nectar Thick Liquid: Impaired Presentation: Spoon(fed; 8-9 trials) Oral Phase Impairments: Poor awareness of bolus(increased lingual smacking to clear) Oral phase functional implications: Prolonged oral transit Pharyngeal Phase Impairments: Cough - Immediate(x1/8-9 trials) Other Comments: suspect d/t poor awareness  of bolus    Honey Thick Honey Thick Liquid: Within functional limits(grossly) Presentation: Spoon(fed; 10 trials) Other Comments: lingual smacking    Puree Puree: Impaired(min) Presentation: Spoon(fed; 10 trials) Oral Phase Impairments: Poor awareness of bolus(lingual smacking) Oral Phase Functional Implications: Prolonged oral transit Pharyngeal Phase Impairments: (none)   Solid     Solid: Not tested Other Comments: d/t Cognitive decline       [name], MS, CCC-SLP Ariday Brinker 11/29/2019,10:59 AM

## 2019-11-29 NOTE — TOC Progression Note (Signed)
Transition of Care [facility]) - Progression Note    Patient Details  Name: ADRION MENZ MRN: [id number] Date of Birth: [birth year]  Transition of Care [facility]) CM/SW [place], RN Phone Number: 11/29/2019, 10:23 AM  Clinical Narrative:    Spoke with [name] the POA, I reviewed the bed offers with him and explained his Bed days do not start until he actually admits to the facility,  We reviewed the bed offers and he chose [facility], I notified them thru the Hub and via call       Expected Discharge Plan and Services                                                 Social Determinants of Health (SDOH) Interventions    Readmission Risk Interventions No flowsheet data found.

## 2019-11-29 NOTE — Progress Notes (Signed)
Subjective: Patient s/p Lorazepam and sedated on my evaluation  Objective: Current vital signs: BP 140/73   Pulse (!) 107   Temp 97.7 F (36.5 C) (Oral)   Resp (!) 21   Ht 5\' 8"  (1.727 m)   Wt 93.2 kg   SpO2 95%   BMI 31.24 kg/m  Vital signs in last 24 hours: Temp:  [97.7 F (36.5 C)-98.4 F (36.9 C)] 97.7 F (36.5 C) (12/31 1129) Pulse Rate:  [99-111] 107 (12/31 1148) Resp:  [17-28] 21 (12/31 1148) BP: (119-154)/(63-76) 140/73 (12/31 1148) SpO2:  [95 %-98 %] 95 % (12/31 1129)  Intake/Output from previous day: 12/30 0701 - 12/31 0700 In: 100 [I.V.:100] Out: 1  Intake/Output this shift: No intake/output data recorded. Nutritional status:  Diet Order            DIET - DYS 1 Room service appropriate? Yes with Assist; Fluid consistency: Honey Thick  Diet effective now              Neurologic Exam: Mental Status: Lethargic and unable to maintain alertness.  Dysarthric.  Does not follow commands.   Cranial Nerves: II: Pupils equal, round, reactive to light and accommodation III,IV, VI: ptosis not present V,VII: mild left facial droop, facial light touch sensation normal bilaterally VIII: hearing normal bilaterally IX,X: gag reflex present XI: unable to test XII: unable to test Motor: Moves both upper extremities spontaneously.  Withdraws both lower extremities.   Sensory: Responds to noxious stimuli in all extremities  Lab Results: Basic Metabolic Panel: Recent Labs  Lab 11/26/19 0419 11/26/19 1728 11/27/19 0157 11/28/19 0617 11/29/19 0315  NA 142 137 139 140 143  K 4.1 4.5 3.9 4.3 3.7  CL 105 103 104 102 104  CO2 25 24 24 24 27   GLUCOSE 257* 341* 300* 189* 153*  BUN 21 18 19 22  33*  CREATININE 1.54* 1.53* 1.62* 1.83* 1.98*  CALCIUM 8.8* 8.4* 8.2* 8.0* 7.9*  MG  --   --   --  2.0 2.4  PHOS  --   --   --  4.0 4.1    Liver Function Tests: Recent Labs  Lab 11/25/19 1450 11/26/19 1728 11/27/19 0157 11/28/19 0617  AST 62* 48* 46* 38  ALT 77*  61* 54* 44  ALKPHOS 119 102 98 86  BILITOT 0.7 0.8 0.7 0.8  PROT 6.7 6.2* 6.2* 5.8*  ALBUMIN 3.6 3.2* 3.2* 2.8*   No results for input(s): LIPASE, AMYLASE in the last 168 hours. Recent Labs  Lab 11/26/19 0058  AMMONIA 26    CBC: Recent Labs  Lab 11/25/19 1450 11/26/19 1728 11/27/19 0157 11/28/19 0617 11/29/19 0315  WBC 6.2 8.3 7.1 7.7 7.0  NEUTROABS 4.6  --   --   --   --   HGB 10.7* 10.9* 10.4* 11.3* 10.8*  HCT 32.1* 33.3* 31.4* 34.4* 35.1*  MCV 83.4 83.5 83.1 83.5 87.8  PLT 163 156 161 191 186    Cardiac Enzymes: Recent Labs  Lab 11/25/19 1450  CKTOTAL 164    Lipid Panel: No results for input(s): CHOL, TRIG, HDL, CHOLHDL, VLDL, LDLCALC in the last 168 hours.  CBG: Recent Labs  Lab 11/28/19 0736 11/28/19 1250 11/28/19 1810 11/28/19 2040 11/29/19 0410  GLUCAP 194* 221* 231* 248* 166*    Microbiology: Results for orders placed or performed during the hospital encounter of 11/25/19  Urine Culture     Status: Abnormal   Collection Time: 11/25/19  2:37 PM   Specimen: Urine, Random  Result Value Ref Range Status   Specimen Description   Final    URINE, RANDOM Performed at [facility], [address], [place], [place] [address]    Special Requests   Final    NONE Performed at [facility], [address] [place], [place] [address]    Culture (A)  Final    <10,000 COLONIES/mL INSIGNIFICANT GROWTH Performed at [place] [address] [place], [place] [address]    Report Status 11/27/2019 FINAL  Final  Respiratory Panel by RT PCR (Flu A&B, Covid) - Urine, Clean Catch     Status: None   Collection Time: 11/25/19  4:22 PM   Specimen: Urine, Clean Catch  Result Value Ref Range Status   SARS Coronavirus 2 by RT PCR NEGATIVE NEGATIVE Final    Comment: (NOTE) SARS-CoV-2 target nucleic acids are NOT DETECTED. The SARS-CoV-2 RNA is generally detectable in upper respiratoy specimens during the acute phase of infection. The  lowest concentration of SARS-CoV-2 viral copies this assay can detect is 131 copies/mL. A negative result does not preclude SARS-Cov-2 infection and should not be used as the sole basis for treatment or other patient management decisions. A negative result may occur with  improper specimen collection/handling, submission of specimen other than nasopharyngeal swab, presence of viral mutation(s) within the areas targeted by this assay, and inadequate number of viral copies (<131 copies/mL). A negative result must be combined with clinical observations, patient history, and epidemiological information. The expected result is Negative. Fact Sheet for Patients:  PinkCheek.be Fact Sheet for Healthcare Providers:  GravelBags.it This test is not yet ap proved or cleared by the [place] FDA and  has been authorized for detection and/or diagnosis of SARS-CoV-2 by FDA under an Emergency Use Authorization (EUA). This EUA will remain  in effect (meaning this test can be used) for the duration of the COVID-19 declaration under Section 564(b)(1) of the Act, 21 U.S.C. section 360bbb-3(b)(1), unless the authorization is terminated or revoked sooner.    Influenza A by PCR NEGATIVE NEGATIVE Final   Influenza B by PCR NEGATIVE NEGATIVE Final    Comment: (NOTE) The Xpert Xpress SARS-CoV-2/FLU/RSV assay is intended as an aid in  the diagnosis of influenza from Nasopharyngeal swab specimens and  should not be used as a sole basis for treatment. Nasal washings and  aspirates are unacceptable for Xpert Xpress SARS-CoV-2/FLU/RSV  testing. Fact Sheet for Patients: PinkCheek.be Fact Sheet for Healthcare Providers: GravelBags.it This test is not yet approved or cleared by the [place] FDA and  has been authorized for detection and/or diagnosis of SARS-CoV-2 by  FDA under an Emergency  Use Authorization (EUA). This EUA will remain  in effect (meaning this test can be used) for the duration of the  Covid-19 declaration under Section 564(b)(1) of the Act, 21  U.S.C. section 360bbb-3(b)(1), unless the authorization is  terminated or revoked. Performed at [facility], [address] [place], [place] [address]   CULTURE, BLOOD (ROUTINE X 2) w Reflex to ID Panel     Status: None (Preliminary result)   Collection Time: 11/26/19 10:51 AM   Specimen: BLOOD  Result Value Ref Range Status   Specimen Description BLOOD RIGHT ANTECUBITAL  Final   Special Requests   Final    BOTTLES DRAWN AEROBIC AND ANAEROBIC Blood Culture adequate volume   Culture   Final    NO GROWTH 3 DAYS Performed at [facility], [place], [place]  [address]    Report Status PENDING  Incomplete  CULTURE, BLOOD (ROUTINE X 2) w Reflex to ID Panel     Status: None (Preliminary result)   Collection Time: 11/26/19 10:51 AM   Specimen: BLOOD  Result Value Ref Range Status   Specimen Description BLOOD BLOOD LEFT FOREARM  Final   Special Requests   Final    BOTTLES DRAWN AEROBIC AND ANAEROBIC Blood Culture adequate volume   Culture   Final    NO GROWTH 3 DAYS Performed at [facility], [address] [place], [place] [address]    Report Status PENDING  Incomplete  Urine Culture     Status: None   Collection Time: 11/27/19  3:53 PM   Specimen: Urine, Random  Result Value Ref Range Status   Specimen Description   Final    URINE, RANDOM Performed at [facility], [address], [place], [place] [address]    Special Requests   Final    NONE Performed at [facility], [address], [place], [place] [address]    Culture   Final    NO GROWTH Performed at [facility], [place] [address] [place], [place] [address]    Report Status 11/28/2019 FINAL  Final  MRSA PCR Screening     Status: None   Collection Time: 11/27/19  3:55 PM    Specimen: Nasopharyngeal  Result Value Ref Range Status   MRSA by PCR NEGATIVE NEGATIVE Final    Comment:        The GeneXpert MRSA Assay (FDA approved for NASAL specimens only), is one component of a comprehensive MRSA colonization surveillance program. It is not intended to diagnose MRSA infection nor to guide or monitor treatment for MRSA infections. Performed at [facility], [address] [place], [place] [address]     Coagulation Studies: No results for input(s): LABPROT, INR in the last 72 hours.  Imaging: EEG  Result Date: 11/27/2019 Alexis Goodell, MD     11/27/2019  3:47 PM ELECTROENCEPHALOGRAM REPORT Patient: Brett [name]       Room #: IC18A-AA EEG No. ID: [id number] Age: 84 y.o.        Sex: male Referring Physician: Jimmye Norman Report Date:  11/27/2019       Interpreting Physician: Alexis Goodell History: EMMERSON TADDEI is an 84 y.o. male with altered mental status Medications: Lorazepam, Zithromax, Rocephin, Insulin Conditions of Recording:  This is a 21 channel routine scalp EEG performed with bipolar and monopolar montages arranged in accordance to the international 10/20 system of electrode placement. One channel was dedicated to EKG recording. The patient is in the altered state. Description:  The background activity is low voltage, slow and poorly organized resembling that of drowse with a mixture of theta and delta activity noted.  This activity is continuous and diffusely distributed.   No epileptiform activity is noted.  Hyperventilation and intermittent photic stimulation were not performed. IMPRESSION: This electroencephalogram is characterized by slowing.  Although this slowing may represent drowse, can not rule out slowing related to a diffuse cerebral disturbance such as a metabolic encephalopathy.  Clinical correlation recommended.  No epileptiform activity is noted.  Alexis Goodell, MD Neurology [phone] 11/27/2019, 3:42 PM   CT HEAD WO  CONTRAST  Result Date: 11/28/2019 CLINICAL DATA:  Altered mental status EXAM: CT HEAD WITHOUT CONTRAST TECHNIQUE: Contiguous axial images were obtained from the base of the skull through the vertex without intravenous contrast. COMPARISON:  11/25/2019 FINDINGS:  Brain: There is no acute intracranial hemorrhage, mass-effect, or edema. No new loss of gray-white differentiation. There is no extra-axial fluid collection. Ventricles and sulci are stable in size and configuration. Patchy hypoattenuation in the supratentorial white matter likely reflects stable chronic microvascular ischemic changes. 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 Vascular: There is atherosclerotic calcification at the skull base. Skull: Calvarium is unremarkable. Sinuses/Orbits: Minor mucosal thickening.  No new orbital finding. Other: None. IMPRESSION: No acute abnormality or substantial change since 11/25/2019. Chronic findings detailed above. Electronically Signed   By: Macy Mis M.D.   On: 11/28/2019 15:38    Medications:  I have reviewed the patient's current medications. Scheduled: .  stroke: mapping our early stages of recovery book   Does not apply Once  . enoxaparin (LOVENOX) injection  40 mg Subcutaneous Q24H  . insulin aspart  0-15 Units Subcutaneous Q4H  . insulin glargine  10 Units Subcutaneous QHS  . ipratropium-albuterol  3 mL Nebulization Once  . ipratropium-albuterol  3 mL Nebulization Q6H  . mouth rinse  15 mL Mouth Rinse BID  . pantoprazole (PROTONIX) IV  40 mg Intravenous Q24H    Assessment/Plan: 84 y.o.malewith a history of multiple medical problems admitted with altered mental status. Etiology unclear. MRI of the brain reviewed and shows no acute changes. Patient with multiple metabolic issues though including elevated LFT's, elevated creatinine, hyperkalemia, hyponatremia and elevated blood sugars. Patient also febrile and started on antibiotics. Mental status likely  multifactorial in etiology and related to above issues. Although these issues are slowly improving it would not be unusual for the patient's cognitive status to lag behind improvement in the lab work. EEG shows no epileptiform activity. Patient without focality on neurological examination today that was noted on yesterday.  Moves both upper extremities spontaneously.  Head CT performed on yesterday showed no acute changes.   Recommendations: 1. Agree with continued attempts at therapy 2. Serum thiamine   LOS: 3 days   Alexis Goodell, MD Neurology [phone] 11/29/2019  12:48 PM

## 2019-11-30 ENCOUNTER — Inpatient Hospital Stay: Payer: Medicare PPO

## 2019-11-30 LAB — BASIC METABOLIC PANEL
Anion gap: 13 (ref 5–15)
Anion gap: 8 (ref 5–15)
BUN: 39 mg/dL — ABNORMAL HIGH (ref 8–23)
BUN: 40 mg/dL — ABNORMAL HIGH (ref 8–23)
CO2: 25 mmol/L (ref 22–32)
CO2: 28 mmol/L (ref 22–32)
Calcium: 8.2 mg/dL — ABNORMAL LOW (ref 8.9–10.3)
Calcium: 8.1 mg/dL — ABNORMAL LOW (ref 8.9–10.3)
Chloride: 106 mmol/L (ref 98–111)
Chloride: 104 mmol/L (ref 98–111)
Creatinine, Ser: 1.7 mg/dL — ABNORMAL HIGH (ref 0.61–1.24)
Creatinine, Ser: 2 mg/dL — ABNORMAL HIGH (ref 0.61–1.24)
GFR calc Af Amer: 43 mL/min — ABNORMAL LOW (ref >60)
GFR calc Af Amer: 35 mL/min — ABNORMAL LOW (ref >60)
GFR calc non Af Amer: 37 mL/min — ABNORMAL LOW (ref >60)
GFR calc non Af Amer: 30 mL/min — ABNORMAL LOW (ref >60)
Glucose, Bld: 217 mg/dL — ABNORMAL HIGH (ref 70–99)
Glucose, Bld: 547 mg/dL (ref 70–99)
Potassium: 3.8 mmol/L (ref 3.5–5.1)
Potassium: 3.5 mmol/L (ref 3.5–5.1)
Sodium: 144 mmol/L (ref 135–145)
Sodium: 140 mmol/L (ref 135–145)

## 2019-11-30 LAB — CBC
HCT: 35.8 % — ABNORMAL LOW (ref 39.0–52.0)
Hemoglobin: 10.9 g/dL — ABNORMAL LOW (ref 13.0–17.0)
MCH: 27 pg (ref 26.0–34.0)
MCHC: 30.4 g/dL (ref 30.0–36.0)
MCV: 88.8 fL (ref 80.0–100.0)
Platelets: 207 10*3/uL (ref 150–400)
RBC: 4.03 MIL/uL — ABNORMAL LOW (ref 4.22–5.81)
RDW: 15 % (ref 11.5–15.5)
WBC: 7.7 10*3/uL (ref 4.0–10.5)
nRBC: 0 % (ref 0.0–0.2)

## 2019-11-30 LAB — GLUCOSE, CAPILLARY
Glucose-Capillary: 177 mg/dL — ABNORMAL HIGH (ref 70–99)
Glucose-Capillary: 186 mg/dL — ABNORMAL HIGH (ref 70–99)
Glucose-Capillary: 266 mg/dL — ABNORMAL HIGH (ref 70–99)
Glucose-Capillary: 272 mg/dL — ABNORMAL HIGH (ref 70–99)
Glucose-Capillary: 430 mg/dL — ABNORMAL HIGH (ref 70–99)
Glucose-Capillary: 542 mg/dL (ref 70–99)

## 2019-11-30 LAB — PHOSPHORUS: Phosphorus: 3.8 mg/dL (ref 2.5–4.6)

## 2019-11-30 LAB — MAGNESIUM: Magnesium: 2.7 mg/dL — ABNORMAL HIGH (ref 1.7–2.4)

## 2019-11-30 MED ORDER — FUROSEMIDE 10 MG/ML IJ SOLN
40.0000 mg | Freq: Once | INTRAMUSCULAR | Status: AC
  Administered 2019-11-30: 09:00:00 40 mg via INTRAVENOUS
  Filled 2019-11-30: qty 4

## 2019-11-30 MED ORDER — INSULIN ASPART 100 UNIT/ML ~~LOC~~ SOLN
25.0000 [IU] | Freq: Once | SUBCUTANEOUS | Status: AC
  Administered 2019-11-30: 21:00:00 25 [IU] via SUBCUTANEOUS
  Filled 2019-11-30: qty 1

## 2019-11-30 MED ORDER — INSULIN ASPART 100 UNIT/ML ~~LOC~~ SOLN
0.0000 [IU] | SUBCUTANEOUS | Status: DC
  Administered 2019-12-01: 13:00:00 20 [IU] via SUBCUTANEOUS
  Administered 2019-12-01: 15 [IU] via SUBCUTANEOUS
  Administered 2019-12-01 (×2): 20 [IU] via SUBCUTANEOUS
  Administered 2019-12-01 – 2019-12-02 (×3): 15 [IU] via SUBCUTANEOUS
  Administered 2019-12-02: 19:00:00 11 [IU] via SUBCUTANEOUS
  Administered 2019-12-02: 15 [IU] via SUBCUTANEOUS
  Administered 2019-12-02 – 2019-12-03 (×7): 20 [IU] via SUBCUTANEOUS
  Administered 2019-12-04: 15 [IU] via SUBCUTANEOUS
  Administered 2019-12-04: 11 [IU] via SUBCUTANEOUS
  Administered 2019-12-04: 15 [IU] via SUBCUTANEOUS
  Administered 2019-12-04: 7 [IU] via SUBCUTANEOUS
  Administered 2019-12-04 – 2019-12-05 (×4): 3 [IU] via SUBCUTANEOUS
  Administered 2019-12-05: 4 [IU] via SUBCUTANEOUS
  Filled 2019-11-30 (×24): qty 1

## 2019-11-30 MED ORDER — PANTOPRAZOLE SODIUM 40 MG PO PACK
40.0000 mg | PACK | Freq: Every day | ORAL | Status: DC
  Administered 2019-12-01 – 2019-12-04 (×4): 40 mg
  Filled 2019-11-30 (×6): qty 20

## 2019-11-30 NOTE — Progress Notes (Addendum)
Patient recently started on Jevity for enteral nutrition via NG tube secondary to dysphagia. He is notably hyperglycemic with blood sugars 350-550.  Current serum glucose 572 per RN report. Covered with 25 units now SQ  and q 4h coverage changed to resistant dose Patient likely to benefit from TF with lower glycemic index  Lipase also added to am labs

## 2019-11-30 NOTE — Progress Notes (Signed)
Blood glucose 542, notified on call provider Sharion Settler NP. Standing order in for stat lab verification. Asked to notify provider upon lab result.

## 2019-11-30 NOTE — Progress Notes (Signed)
Pt dislodged NG tube., tube was advance back to 70 mark. Feed was stopped until placement is reverified. NP, Rachael Fee made aware. Mits placed and 1 mit removed by pt. when tube dislodged.  Removed mit replace on pt's hand second mit checked for security. PRN haldol given to help with agitation.

## 2019-11-30 NOTE — Progress Notes (Signed)
PROGRESS NOTE    Brett [name]  KWI:[id number] DOB: [birth year] DOA: 11/25/2019 PCP: Perrin Maltese, MD      Assessment & Plan:   Active Problems:   B-cell lymphoma (HCC)   COPD (chronic obstructive pulmonary disease) (HCC)   Sepsis (HCC)   HTN (hypertension)   Diabetes (HCC)   CAD (coronary artery disease)   Chronic diastolic heart failure (HCC)   Stroke (HCC)   COPD exacerbation (HCC)   Type 2 diabetes mellitus with hyperosmolar nonketotic hyperglycemia (HCC)   AF (paroxysmal atrial fibrillation) (HCC)   Acute encephalopathy   High serum osmolar gap   Dysphagia following unspecified cerebrovascular disease   Possibly acute metabolic encephalopathy: improving, pt still asking for water and it was explained to pt why he could not have water but it does seem like pt understands. Not at pt's baseline as per pt's MPOA, pt is normally alert & oriented & able to ambulate independently. MPOA is pt's friend [name]. CT brain & MRI brain showed no acute abnormalities. Repeat CT scan shows no acute abnormalities. PT/OT consulted but unable to complete at this time. EEG shows slowing but no epileptiform activity. Neuro following and recs apprec. Speech following and recs apprec   Dysphagia: NPO. Continue w/ NG tube and start tube feeds. Speech following and recs apprec   Sepsis: etiology unclear. Blood cxs NGTD & urine cx pending. Tylenol prn for fevers. Will continue on IV azithromycin & ceftriaxone.  Possible pneumonia: possible atypical or gram positive. Strep, mycoplasma is neg. Legionella pending. Continue on IV ceftriaxone and azithromycin x5 days total. Continue on supplemental oxygen and wean as tolerated. Nebs prn   Acute hypoxic respiratory failure: improving. Etiology unclear, possible CHF exacerbation and/or pneumonia. IV lasix x1 given again today. Continue on IV abxs. Continue on supplemental oxygen and wean as tolerated.  Acute on chronic diastolic heart failure:  will give IV lasix x1 today as repeat CXR shows some congestion. Monitor I/Os.   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 Cr is trending down today. Avoid nephrotoxic meds.  Will continue to monitor    B-cell lymphoma: chronic & currently in remission  COPD: continue nebs, incentive spirometry. VBG obtained no evidence of CO2 retention to explain mental status changes.  HTN: IV hydralazine prn   CAD: chronic & stable. When able to tolerate p.o. resume home medications  DM2: poorly controlled. Continue on lantus and SSI w/ accuchecks   Possible PAF: IV metoprolol prn for HR >130. Patient apparently not anticoagulation he has amiodarone with his list of medication & his MPOA was unaware the patient has atrial fibrillation.   Elevated lactic acid: resolved. Etiology unclear.   Abdominal distention: etiology unclear. KUB & CT abd/pelvis shows no acute abnormailities   Transaminitis: etiology unclear. Resolved  Normocytic anemia: etiology unclear. No need for a transfusion at this time. Will continue to monitor   Right eye blindness: chronic as per pt's MPOA   DVT prophylaxis: lovenox Code Status: DNR Family Communication: Pt is estranged from his family who evidently lives in [place] and [name].  Disposition Plan:   Consultants:  Neuro   Procedures: n/a   Antimicrobials: azithromycin, ceftriaxone    Subjective: Pt is awake today and asking for water again. Intermittently answers questions appropriately.  Objective: Vitals:   11/29/19 1631 11/29/19 1800 11/29/19 2046 11/30/19 0500  BP: (!) 158/77     Pulse: (!) 108     Resp: (!) 22 20    Temp:  98.2 F (36.8 C)     TempSrc: Oral     SpO2: 100%  97%   Weight:    89.6 kg  Height:        Intake/Output Summary (Last 24 hours) at 11/30/2019 1221 Last data filed at 11/30/2019 0300 Gross per 24 hour  Intake 504.5 ml  Output --  Net 504.5 ml   Filed Weights   11/25/19 1430 11/27/19 0839 11/30/19 0500   Weight: 91 kg 93.2 kg 89.6 kg    Examination:  General exam: Appears calm and comfortable  Respiratory system:  diminished breath sounds b/l. No rales Cardiovascular system: S1 & S2 +. No rubs, gallops or clicks. B/l LE  2+ pitting edema Gastrointestinal system: Abdomen is obese, soft and nontender.  normal bowel sounds heard. Central nervous system: Awake. Intermittently follow commands  Psychiatry: Judgement and insight appear abnormal. Flat mood and affect      Data Reviewed: I have personally reviewed following labs and imaging studies  CBC: Recent Labs  Lab 11/25/19 1450 11/26/19 1728 11/27/19 0157 11/28/19 0617 11/29/19 0315 11/30/19 0627  WBC 6.2 8.3 7.1 7.7 7.0 7.7  NEUTROABS 4.6  --   --   --   --   --   HGB 10.7* 10.9* 10.4* 11.3* 10.8* 10.9*  HCT 32.1* 33.3* 31.4* 34.4* 35.1* 35.8*  MCV 83.4 83.5 83.1 83.5 87.8 88.8  PLT 163 156 161 191 186 937   Basic Metabolic Panel: Recent Labs  Lab 11/26/19 1728 11/27/19 0157 11/28/19 0617 11/29/19 0315 11/30/19 0627  NA 137 139 140 143 144  K 4.5 3.9 4.3 3.7 3.8  CL 103 104 102 104 106  CO2 24 24 24 27 25   GLUCOSE 341* 300* 189* 153* 217*  BUN 18 19 22  33* 39*  CREATININE 1.53* 1.62* 1.83* 1.98* 1.70*  CALCIUM 8.4* 8.2* 8.0* 7.9* 8.2*  MG  --   --  2.0 2.4 2.7*  PHOS  --   --  4.0 4.1 3.8   GFR: Estimated Creatinine Clearance: 36.4 mL/min (A) (by C-G formula based on SCr of 1.7 mg/dL (H)). Liver Function Tests: Recent Labs  Lab 11/25/19 1450 11/26/19 1728 11/27/19 0157 11/28/19 0617  AST 62* 48* 46* 38  ALT 77* 61* 54* 44  ALKPHOS 119 102 98 86  BILITOT 0.7 0.8 0.7 0.8  PROT 6.7 6.2* 6.2* 5.8*  ALBUMIN 3.6 3.2* 3.2* 2.8*   No results for input(s): LIPASE, AMYLASE in the last 168 hours. Recent Labs  Lab 11/26/19 0058  AMMONIA 26   Coagulation Profile: No results for input(s): INR, PROTIME in the last 168 hours. Cardiac Enzymes: Recent Labs  Lab 11/25/19 1450  CKTOTAL 164   BNP (last 3  results) No results for input(s): PROBNP in the last 8760 hours. HbA1C: No results for input(s): HGBA1C in the last 72 hours. CBG: Recent Labs  Lab 11/29/19 2057 11/29/19 2230 11/30/19 0014 11/30/19 0414 11/30/19 0754  GLUCAP 246* 257* 272* 266* 177*   Lipid Profile: No results for input(s): CHOL, HDL, LDLCALC, TRIG, CHOLHDL, LDLDIRECT in the last 72 hours. Thyroid Function Tests: No results for input(s): TSH, T4TOTAL, FREET4, T3FREE, THYROIDAB in the last 72 hours. Anemia Panel: No results for input(s): VITAMINB12, FOLATE, FERRITIN, TIBC, IRON, RETICCTPCT in the last 72 hours. Sepsis Labs: Recent Labs  Lab 11/25/19 1450 11/25/19 2204 11/26/19 0058  LATICACIDVEN 3.7* 3.0* 1.9    Recent Results (from the past 240 hour(s))  Urine Culture     Status: Abnormal  Collection Time: 11/25/19  2:37 PM   Specimen: Urine, Random  Result Value Ref Range Status   Specimen Description   Final    URINE, RANDOM Performed at [facility], [address], [place], [place] [address]    Special Requests   Final    NONE Performed at [facility], [address] [place], [place] [address]    Culture (A)  Final    <10,000 COLONIES/mL INSIGNIFICANT GROWTH Performed at [facility] [address] [place], [place] [address]    Report Status 11/27/2019 FINAL  Final  Respiratory Panel by RT PCR (Flu A&B, Covid) - Urine, Clean Catch     Status: None   Collection Time: 11/25/19  4:22 PM   Specimen: Urine, Clean Catch  Result Value Ref Range Status   SARS Coronavirus 2 by RT PCR NEGATIVE NEGATIVE Final    Comment: (NOTE) SARS-CoV-2 target nucleic acids are NOT DETECTED. The SARS-CoV-2 RNA is generally detectable in upper respiratoy specimens during the acute phase of infection. The lowest concentration of SARS-CoV-2 viral copies this assay can detect is 131 copies/mL. A negative result does not preclude SARS-Cov-2 infection and should not be used as the sole  basis for treatment or other patient management decisions. A negative result may occur with  improper specimen collection/handling, submission of specimen other than nasopharyngeal swab, presence of viral mutation(s) within the areas targeted by this assay, and inadequate number of viral copies (<131 copies/mL). A negative result must be combined with clinical observations, patient history, and epidemiological information. The expected result is Negative. Fact Sheet for Patients:  PinkCheek.be Fact Sheet for Healthcare Providers:  GravelBags.it This test is not yet ap proved or cleared by the [place] FDA and  has been authorized for detection and/or diagnosis of SARS-CoV-2 by FDA under an Emergency Use Authorization (EUA). This EUA will remain  in effect (meaning this test can be used) for the duration of the COVID-19 declaration under Section 564(b)(1) of the Act, 21 U.S.C. section 360bbb-3(b)(1), unless the authorization is terminated or revoked sooner.    Influenza A by PCR NEGATIVE NEGATIVE Final   Influenza B by PCR NEGATIVE NEGATIVE Final    Comment: (NOTE) The Xpert Xpress SARS-CoV-2/FLU/RSV assay is intended as an aid in  the diagnosis of influenza from Nasopharyngeal swab specimens and  should not be used as a sole basis for treatment. Nasal washings and  aspirates are unacceptable for Xpert Xpress SARS-CoV-2/FLU/RSV  testing. Fact Sheet for Patients: PinkCheek.be Fact Sheet for Healthcare Providers: GravelBags.it This test is not yet approved or cleared by the [place] FDA and  has been authorized for detection and/or diagnosis of SARS-CoV-2 by  FDA under an Emergency Use Authorization (EUA). This EUA will remain  in effect (meaning this test can be used) for the duration of the  Covid-19 declaration under Section 564(b)(1) of the Act, 21  U.S.C.  section 360bbb-3(b)(1), unless the authorization is  terminated or revoked. Performed at [facility], [address] [place], [place] [address]   CULTURE, BLOOD (ROUTINE X 2) w Reflex to ID Panel     Status: None (Preliminary result)   Collection Time: 11/26/19 10:51 AM   Specimen: BLOOD  Result Value Ref Range Status   Specimen Description BLOOD RIGHT ANTECUBITAL  Final   Special Requests   Final    BOTTLES DRAWN AEROBIC AND ANAEROBIC Blood Culture adequate volume   Culture   Final    NO GROWTH 4  DAYS Performed at [facility], [address] [place], [place] [address]    Report Status PENDING  Incomplete  CULTURE, BLOOD (ROUTINE X 2) w Reflex to ID Panel     Status: None (Preliminary result)   Collection Time: 11/26/19 10:51 AM   Specimen: BLOOD  Result Value Ref Range Status   Specimen Description BLOOD BLOOD LEFT FOREARM  Final   Special Requests   Final    BOTTLES DRAWN AEROBIC AND ANAEROBIC Blood Culture adequate volume   Culture   Final    NO GROWTH 4 DAYS Performed at [facility], [address], [place], [place] [address]    Report Status PENDING  Incomplete  Urine Culture     Status: None   Collection Time: 11/27/19  3:53 PM   Specimen: Urine, Random  Result Value Ref Range Status   Specimen Description   Final    URINE, RANDOM Performed at [facility], [address] [place], [place] [address]    Special Requests   Final    NONE Performed at [facility], [address], [place], [place] [address]    Culture   Final    NO GROWTH Performed at [facility], [place] [address], [place], [place] [address]    Report Status 11/28/2019 FINAL  Final  MRSA PCR Screening     Status: None   Collection Time: 11/27/19  3:55 PM   Specimen: Nasopharyngeal  Result Value Ref Range Status   MRSA by PCR NEGATIVE NEGATIVE Final    Comment:        The GeneXpert MRSA Assay (FDA approved for NASAL specimens only), is  one component of a comprehensive MRSA colonization surveillance program. It is not intended to diagnose MRSA infection nor to guide or monitor treatment for MRSA infections. Performed at [facility], [address] [place], [place] [address]          Radiology Studies: DG Chest 1 View  Result Date: 11/30/2019 CLINICAL DATA:  Confirm NG tube placement. EXAM: CHEST  1 VIEW COMPARISON:  Chest and abdominal radiographs yesterday. FINDINGS: Enteric tube tip is below the diaphragm, not included in the field of view. Enteric side-port is not seen on the current exam. Unchanged heart size and mediastinal contours. Vascular congestion. Possible small pleural effusions/atelectasis at the bases. Patient's chin obscures the right lung apex. IMPRESSION: 1. Enteric tube tip below the diaphragm, not included in the field of view. 2. Vascular congestion with possible small pleural effusions/atelectasis at the bases. Electronically Signed   By: Keith Rake M.D.   On: 11/30/2019 06:22   DG Abd 1 View  Result Date: 11/30/2019 CLINICAL DATA:  Nasogastric tube placement. EXAM: ABDOMEN - 1 VIEW COMPARISON:  November 29, 2019. FINDINGS: The bowel gas pattern is normal. Distal tip of nasogastric tube is seen in expected position of distal stomach. No radio-opaque calculi or other significant radiographic abnormality are seen. IMPRESSION: Distal tip of nasogastric tube is seen in expected position of distal stomach. No evidence of bowel obstruction or ileus. Electronically Signed   By: Marijo Conception M.D.   On: 11/30/2019 08:53   DG Abd 1 View  Result Date: 11/29/2019 CLINICAL DATA:  Nasogastric tube. EXAM: ABDOMEN - 1 VIEW COMPARISON:  Same day. FINDINGS: The bowel gas pattern is normal. Nasogastric tube tip is seen in the stomach. No radio-opaque calculi or other significant radiographic abnormality are seen. IMPRESSION: Nasogastric tube seen in the stomach. No abnormal  bowel dilatation is noted.  Electronically Signed   By: Marijo Conception M.D.   On: 11/29/2019 21:19   DG Abd 1 View  Result Date: 11/29/2019 CLINICAL DATA:  NG tube placement EXAM: ABDOMEN - 1 VIEW COMPARISON:  Abdominal radiograph 11/25/2019 FINDINGS: Interval placement of a nasogastric tube with side port projecting at or just beyond the GE junction. Limited view of the upper abdomen without acute abnormality identified. IMPRESSION: Interval placement of a nasogastric tube with side port projecting at or just beyond the GE junction. Recommend advancement for more optimal positioning, approximately 5-6 cm. Electronically Signed   By: Audie Pinto M.D.   On: 11/29/2019 20:02   CT HEAD WO CONTRAST  Result Date: 11/28/2019 CLINICAL DATA:  Altered mental status EXAM: CT HEAD WITHOUT CONTRAST TECHNIQUE: Contiguous axial images were obtained from the base of the skull through the vertex without intravenous contrast. COMPARISON:  11/25/2019 FINDINGS: Brain: There is no acute intracranial hemorrhage, mass-effect, or edema. No new loss of gray-white differentiation. There is no extra-axial fluid collection. Ventricles and sulci are stable in size and configuration. Patchy hypoattenuation in the supratentorial white matter likely reflects stable chronic microvascular ischemic changes. 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 Vascular: There is atherosclerotic calcification at the skull base. Skull: Calvarium is unremarkable. Sinuses/Orbits: Minor mucosal thickening.  No new orbital finding. Other: None. IMPRESSION: No acute abnormality or substantial change since 11/25/2019. Chronic findings detailed above. Electronically Signed   By: Macy Mis M.D.   On: 11/28/2019 15:38   DG Chest Port 1 View  Result Date: 11/29/2019 CLINICAL DATA:  Nasogastric tube insertion EXAM: PORTABLE CHEST 1 VIEW COMPARISON:  11/27/2019 FINDINGS: Interval placement of nasogastric tube with side hole at the level of the GE junction and  distal tip within the proximal stomach. The heart size and mediastinal contours are stable. Probable small bilateral pleural effusions. IMPRESSION: 1. Interval placement of nasogastric tube with side hole at the level of the GE junction, recommend advancement approximately 5 cm. 2. Probable small bilateral pleural effusions. Electronically Signed   By: Davina Poke D.O.   On: 11/29/2019 19:01        Scheduled Meds: .  stroke: mapping our early stages of recovery book   Does not apply Once  . enoxaparin (LOVENOX) injection  40 mg Subcutaneous Q24H  . feeding supplement (PRO-STAT SUGAR FREE 64)  30 mL Per Tube Daily  . free water  30 mL Per Tube Q4H  . insulin aspart  0-15 Units Subcutaneous Q4H  . insulin glargine  10 Units Subcutaneous QHS  . ipratropium-albuterol  3 mL Nebulization Once  . ipratropium-albuterol  3 mL Nebulization TID  . mouth rinse  15 mL Mouth Rinse BID  . pantoprazole (PROTONIX) IV  40 mg Intravenous Q24H   Continuous Infusions: . sodium chloride Stopped (11/27/19 0820)  . azithromycin 500 mg (11/29/19 1440)  . cefTRIAXone (ROCEPHIN)  IV Stopped (11/29/19 1345)  . feeding supplement (JEVITY 1.2 CAL) Stopped (11/30/19 0446)     LOS: 4 days    Time spent: 33 mins    Wyvonnia Dusky, MD [facility] Pager [phone]  If 7PM-7AM, please contact night-coverage [email or web address] Password [facility] 11/30/2019, 12:21 PM

## 2019-11-30 NOTE — Progress Notes (Signed)
Subjective: Patient awake and alert.  Continues to ask for water.  Objective: Current vital signs: BP (!) 158/77   Pulse (!) 108   Temp 98.2 F (36.8 C) (Oral)   Resp 20   Ht 5\' 8"  (1.727 m)   Wt 89.6 kg   SpO2 97%   BMI 30.03 kg/m  Vital signs in last 24 hours: Temp:  [97.7 F (36.5 C)-98.2 F (36.8 C)] 98.2 F (36.8 C) (12/31 1631) Pulse Rate:  [107-111] 108 (12/31 1631) Resp:  [20-28] 20 (12/31 1800) BP: (140-158)/(73-77) 158/77 (12/31 1631) SpO2:  [95 %-100 %] 97 % (12/31 2046) Weight:  [89.6 kg] 89.6 kg (01/01 0500)  Intake/Output from previous day: 12/31 0701 - 01/01 0700 In: 504.5 [NG/GT:304.5; IV Piggyback:200] Out: -  Intake/Output this shift: No intake/output data recorded. Nutritional status:  Diet Order            DIET - DYS 1 Room service appropriate? Yes with Assist; Fluid consistency: Honey Thick  Diet effective now              Neurologic Exam: Mental Status: Alert.  Speech fluent without evidence of aphasia.  Follows some simple commands Cranial Nerves: VQ:QVZDGL equal, round, reactive to light and accommodation III,IV, VI: ptosis not present V,VII:mild left facial droop, facial light touch sensation normal bilaterally VIII: hearing normal bilaterally IX,X: gag reflex present OV:FIEPPI to test RJJ:OACZYS to test Motor: Moves both upper extremities spontaneously. Withdraws both lower extremities.  Sensory:Responds to noxious stimuli in all extremities   Lab Results: Basic Metabolic Panel: Recent Labs  Lab 11/26/19 1728 11/27/19 0157 11/28/19 0617 11/29/19 0315 11/30/19 0627  NA 137 139 140 143 144  K 4.5 3.9 4.3 3.7 3.8  CL 103 104 102 104 106  CO2 24 24 24 27 25   GLUCOSE 341* 300* 189* 153* 217*  BUN 18 19 22  33* 39*  CREATININE 1.53* 1.62* 1.83* 1.98* 1.70*  CALCIUM 8.4* 8.2* 8.0* 7.9* 8.2*  MG  --   --  2.0 2.4 2.7*  PHOS  --   --  4.0 4.1 3.8    Liver Function Tests: Recent Labs  Lab 11/25/19 1450  11/26/19 1728 11/27/19 0157 11/28/19 0617  AST 62* 48* 46* 38  ALT 77* 61* 54* 44  ALKPHOS 119 102 98 86  BILITOT 0.7 0.8 0.7 0.8  PROT 6.7 6.2* 6.2* 5.8*  ALBUMIN 3.6 3.2* 3.2* 2.8*   No results for input(s): LIPASE, AMYLASE in the last 168 hours. Recent Labs  Lab 11/26/19 0058  AMMONIA 26    CBC: Recent Labs  Lab 11/25/19 1450 11/26/19 1728 11/27/19 0157 11/28/19 0617 11/29/19 0315 11/30/19 0627  WBC 6.2 8.3 7.1 7.7 7.0 7.7  NEUTROABS 4.6  --   --   --   --   --   HGB 10.7* 10.9* 10.4* 11.3* 10.8* 10.9*  HCT 32.1* 33.3* 31.4* 34.4* 35.1* 35.8*  MCV 83.4 83.5 83.1 83.5 87.8 88.8  PLT 163 156 161 191 186 207    Cardiac Enzymes: Recent Labs  Lab 11/25/19 1450  CKTOTAL 164    Lipid Panel: No results for input(s): CHOL, TRIG, HDL, CHOLHDL, VLDL, LDLCALC in the last 168 hours.  CBG: Recent Labs  Lab 11/29/19 2057 11/29/19 2230 11/30/19 0014 11/30/19 0414 11/30/19 0754  GLUCAP 246* 257* 272* 266* 177*    Microbiology: Results for orders placed or performed during the hospital encounter of 11/25/19  Urine Culture     Status: Abnormal   Collection Time:  11/25/19  2:37 PM   Specimen: Urine, Random  Result Value Ref Range Status   Specimen Description   Final    URINE, RANDOM Performed at [facility], [address], [place], [place] [address]    Special Requests   Final    NONE Performed at [facility], [address] [place], [place] [address]    Culture (A)  Final    <10,000 COLONIES/mL INSIGNIFICANT GROWTH Performed at [facility], [place] [address] [place], [place] [address]    Report Status 11/27/2019 FINAL  Final  Respiratory Panel by RT PCR (Flu A&B, Covid) - Urine, Clean Catch     Status: None   Collection Time: 11/25/19  4:22 PM   Specimen: Urine, Clean Catch  Result Value Ref Range Status   SARS Coronavirus 2 by RT PCR NEGATIVE NEGATIVE Final    Comment: (NOTE) SARS-CoV-2 target nucleic acids are NOT  DETECTED. The SARS-CoV-2 RNA is generally detectable in upper respiratoy specimens during the acute phase of infection. The lowest concentration of SARS-CoV-2 viral copies this assay can detect is 131 copies/mL. A negative result does not preclude SARS-Cov-2 infection and should not be used as the sole basis for treatment or other patient management decisions. A negative result may occur with  improper specimen collection/handling, submission of specimen other than nasopharyngeal swab, presence of viral mutation(s) within the areas targeted by this assay, and inadequate number of viral copies (<131 copies/mL). A negative result must be combined with clinical observations, patient history, and epidemiological information. The expected result is Negative. Fact Sheet for Patients:  PinkCheek.be Fact Sheet for Healthcare Providers:  GravelBags.it This test is not yet ap proved or cleared by the [place] FDA and  has been authorized for detection and/or diagnosis of SARS-CoV-2 by FDA under an Emergency Use Authorization (EUA). This EUA will remain  in effect (meaning this test can be used) for the duration of the COVID-19 declaration under Section 564(b)(1) of the Act, 21 U.S.C. section 360bbb-3(b)(1), unless the authorization is terminated or revoked sooner.    Influenza A by PCR NEGATIVE NEGATIVE Final   Influenza B by PCR NEGATIVE NEGATIVE Final    Comment: (NOTE) The Xpert Xpress SARS-CoV-2/FLU/RSV assay is intended as an aid in  the diagnosis of influenza from Nasopharyngeal swab specimens and  should not be used as a sole basis for treatment. Nasal washings and  aspirates are unacceptable for Xpert Xpress SARS-CoV-2/FLU/RSV  testing. Fact Sheet for Patients: PinkCheek.be Fact Sheet for Healthcare Providers: GravelBags.it This test is not yet approved or cleared  by the [place] FDA and  has been authorized for detection and/or diagnosis of SARS-CoV-2 by  FDA under an Emergency Use Authorization (EUA). This EUA will remain  in effect (meaning this test can be used) for the duration of the  Covid-19 declaration under Section 564(b)(1) of the Act, 21  U.S.C. section 360bbb-3(b)(1), unless the authorization is  terminated or revoked. Performed at [facility], [address] [place], [place] [address]   CULTURE, BLOOD (ROUTINE X 2) w Reflex to ID Panel     Status: None (Preliminary result)   Collection Time: 11/26/19 10:51 AM   Specimen: BLOOD  Result Value Ref Range Status   Specimen Description BLOOD RIGHT ANTECUBITAL  Final   Special Requests   Final    BOTTLES DRAWN AEROBIC AND ANAEROBIC Blood Culture adequate volume   Culture   Final    NO GROWTH 4 DAYS Performed  at [facility], [address] [place], [place] [address]    Report Status PENDING  Incomplete  CULTURE, BLOOD (ROUTINE X 2) w Reflex to ID Panel     Status: None (Preliminary result)   Collection Time: 11/26/19 10:51 AM   Specimen: BLOOD  Result Value Ref Range Status   Specimen Description BLOOD BLOOD LEFT FOREARM  Final   Special Requests   Final    BOTTLES DRAWN AEROBIC AND ANAEROBIC Blood Culture adequate volume   Culture   Final    NO GROWTH 4 DAYS Performed at [facility], [address], [place], [place] [address]    Report Status PENDING  Incomplete  Urine Culture     Status: None   Collection Time: 11/27/19  3:53 PM   Specimen: Urine, Random  Result Value Ref Range Status   Specimen Description   Final    URINE, RANDOM Performed at [facility], [address] [place], [place] [address]    Special Requests   Final    NONE Performed at [facility], [address], [place], [place] [address]    Culture   Final    NO GROWTH Performed at [facility], [place] [address], [place], [place] [address]     Report Status 11/28/2019 FINAL  Final  MRSA PCR Screening     Status: None   Collection Time: 11/27/19  3:55 PM   Specimen: Nasopharyngeal  Result Value Ref Range Status   MRSA by PCR NEGATIVE NEGATIVE Final    Comment:        The GeneXpert MRSA Assay (FDA approved for NASAL specimens only), is one component of a comprehensive MRSA colonization surveillance program. It is not intended to diagnose MRSA infection nor to guide or monitor treatment for MRSA infections. Performed at [facility], [address] [place], [place] [address]     Coagulation Studies: No results for input(s): LABPROT, INR in the last 72 hours.  Imaging: DG Chest 1 View  Result Date: 11/30/2019 CLINICAL DATA:  Confirm NG tube placement. EXAM: CHEST  1 VIEW COMPARISON:  Chest and abdominal radiographs yesterday. FINDINGS: Enteric tube tip is below the diaphragm, not included in the field of view. Enteric side-port is not seen on the current exam. Unchanged heart size and mediastinal contours. Vascular congestion. Possible small pleural effusions/atelectasis at the bases. Patient's chin obscures the right lung apex. IMPRESSION: 1. Enteric tube tip below the diaphragm, not included in the field of view. 2. Vascular congestion with possible small pleural effusions/atelectasis at the bases. Electronically Signed   By: Keith Rake M.D.   On: 11/30/2019 06:22   DG Abd 1 View  Result Date: 11/30/2019 CLINICAL DATA:  Nasogastric tube placement. EXAM: ABDOMEN - 1 VIEW COMPARISON:  November 29, 2019. FINDINGS: The bowel gas pattern is normal. Distal tip of nasogastric tube is seen in expected position of distal stomach. No radio-opaque calculi or other significant radiographic abnormality are seen. IMPRESSION: Distal tip of nasogastric tube is seen in expected position of distal stomach. No evidence of bowel obstruction or ileus. Electronically Signed   By: Marijo Conception M.D.   On: 11/30/2019 08:53   DG  Abd 1 View  Result Date: 11/29/2019 CLINICAL DATA:  Nasogastric tube. EXAM: ABDOMEN - 1 VIEW COMPARISON:  Same day. FINDINGS: The bowel gas pattern is normal. Nasogastric tube tip is seen in the stomach. No radio-opaque calculi or other significant radiographic abnormality are seen. IMPRESSION: Nasogastric  tube seen in the stomach. No abnormal bowel dilatation is noted. Electronically Signed   By: Marijo Conception M.D.   On: 11/29/2019 21:19   DG Abd 1 View  Result Date: 11/29/2019 CLINICAL DATA:  NG tube placement EXAM: ABDOMEN - 1 VIEW COMPARISON:  Abdominal radiograph 11/25/2019 FINDINGS: Interval placement of a nasogastric tube with side port projecting at or just beyond the GE junction. Limited view of the upper abdomen without acute abnormality identified. IMPRESSION: Interval placement of a nasogastric tube with side port projecting at or just beyond the GE junction. Recommend advancement for more optimal positioning, approximately 5-6 cm. Electronically Signed   By: Audie Pinto M.D.   On: 11/29/2019 20:02   CT HEAD WO CONTRAST  Result Date: 11/28/2019 CLINICAL DATA:  Altered mental status EXAM: CT HEAD WITHOUT CONTRAST TECHNIQUE: Contiguous axial images were obtained from the base of the skull through the vertex without intravenous contrast. COMPARISON:  11/25/2019 FINDINGS: Brain: There is no acute intracranial hemorrhage, mass-effect, or edema. No new loss of gray-white differentiation. There is no extra-axial fluid collection. Ventricles and sulci are stable in size and configuration. Patchy hypoattenuation in the supratentorial white matter likely reflects stable chronic microvascular ischemic changes. 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 Vascular: There is atherosclerotic calcification at the skull base. Skull: Calvarium is unremarkable. Sinuses/Orbits: Minor mucosal thickening.  No new orbital finding. Other: None. IMPRESSION: No acute abnormality or substantial  change since 11/25/2019. Chronic findings detailed above. Electronically Signed   By: Macy Mis M.D.   On: 11/28/2019 15:38   DG Chest Port 1 View  Result Date: 11/29/2019 CLINICAL DATA:  Nasogastric tube insertion EXAM: PORTABLE CHEST 1 VIEW COMPARISON:  11/27/2019 FINDINGS: Interval placement of nasogastric tube with side hole at the level of the GE junction and distal tip within the proximal stomach. The heart size and mediastinal contours are stable. Probable small bilateral pleural effusions. IMPRESSION: 1. Interval placement of nasogastric tube with side hole at the level of the GE junction, recommend advancement approximately 5 cm. 2. Probable small bilateral pleural effusions. Electronically Signed   By: Davina Poke D.O.   On: 11/29/2019 19:01    Medications:  I have reviewed the patient's current medications. Scheduled: .  stroke: mapping our early stages of recovery book   Does not apply Once  . enoxaparin (LOVENOX) injection  40 mg Subcutaneous Q24H  . feeding supplement (PRO-STAT SUGAR FREE 64)  30 mL Per Tube Daily  . free water  30 mL Per Tube Q4H  . insulin aspart  0-15 Units Subcutaneous Q4H  . insulin glargine  10 Units Subcutaneous QHS  . ipratropium-albuterol  3 mL Nebulization Once  . ipratropium-albuterol  3 mL Nebulization TID  . mouth rinse  15 mL Mouth Rinse BID  . pantoprazole (PROTONIX) IV  40 mg Intravenous Q24H    Assessment/Plan: 84 y.o.malewith a history of multiple medical problems admitted with altered mental status. Etiology unclear. MRI of the brain reviewed and shows no acute changes. Patient with multiple metabolic issues though including elevated LFT's, elevated creatinine, hyperkalemia, hyponatremia and elevated blood sugars. Patient also febrile and started on antibiotics. Mental status likely multifactorial in etiology and related to above issues. Although these issues are slowly improving it would not be unusual for the patient's  cognitive status to lag behind improvement in the lab work.EEG shows no epileptiform activity. Patient remains nonfocal.  Moves both upper extremities spontaneously.  Now starting to follow some commands.  Thiamine pending.  Recommendations: 1. Agree with continued attempts at therapy   LOS: 4 days   Alexis Goodell, MD Neurology [phone] 11/30/2019  9:03 AM

## 2019-11-30 NOTE — Progress Notes (Signed)
PHARMACIST - PHYSICIAN COMMUNICATION  CONCERNING: IV to Oral Route Change Policy  RECOMMENDATION: This patient is receiving pantoprazole by the intravenous route.  Based on criteria approved by the Pharmacy and Therapeutics Committee, the intravenous medication(s) is/are being converted to the equivalent oral dose form(s).   DESCRIPTION: These criteria include:  The patient is eating (either orally or via tube) and/or has been taking other orally administered medications for a least 24 hours  The patient has no evidence of active gastrointestinal bleeding or impaired GI absorption (gastrectomy, short bowel, patient on TNA or NPO).  If you have questions about this conversion, please contact the Pharmacy Department  []   [phone] )  [facility] [x]   [phone] )  [facility] []   [phone] )  [facility] []   [phone] )  [facility] []   [phone] )  [place] 11/30/2019 3:23 PM

## 2019-12-01 LAB — BASIC METABOLIC PANEL
Anion gap: 11 (ref 5–15)
BUN: 39 mg/dL — ABNORMAL HIGH (ref 8–23)
CO2: 26 mmol/L (ref 22–32)
Calcium: 8.3 mg/dL — ABNORMAL LOW (ref 8.9–10.3)
Chloride: 106 mmol/L (ref 98–111)
Creatinine, Ser: 2.01 mg/dL — ABNORMAL HIGH (ref 0.61–1.24)
GFR calc Af Amer: 35 mL/min — ABNORMAL LOW (ref >60)
GFR calc non Af Amer: 30 mL/min — ABNORMAL LOW (ref >60)
Glucose, Bld: 453 mg/dL — ABNORMAL HIGH (ref 70–99)
Potassium: 3.5 mmol/L (ref 3.5–5.1)
Sodium: 143 mmol/L (ref 135–145)

## 2019-12-01 LAB — CBC
HCT: 37.6 % — ABNORMAL LOW (ref 39.0–52.0)
Hemoglobin: 11.3 g/dL — ABNORMAL LOW (ref 13.0–17.0)
MCH: 27 pg (ref 26.0–34.0)
MCHC: 30.1 g/dL (ref 30.0–36.0)
MCV: 89.7 fL (ref 80.0–100.0)
Platelets: 236 10*3/uL (ref 150–400)
RBC: 4.19 MIL/uL — ABNORMAL LOW (ref 4.22–5.81)
RDW: 15.2 % (ref 11.5–15.5)
WBC: 8.8 10*3/uL (ref 4.0–10.5)
nRBC: 0 % (ref 0.0–0.2)

## 2019-12-01 LAB — CULTURE, BLOOD (ROUTINE X 2)
Culture: NO GROWTH
Culture: NO GROWTH
Special Requests: ADEQUATE
Special Requests: ADEQUATE

## 2019-12-01 LAB — GLUCOSE, CAPILLARY
Glucose-Capillary: 169 mg/dL — ABNORMAL HIGH (ref 70–99)
Glucose-Capillary: 173 mg/dL — ABNORMAL HIGH (ref 70–99)
Glucose-Capillary: 301 mg/dL — ABNORMAL HIGH (ref 70–99)
Glucose-Capillary: 328 mg/dL — ABNORMAL HIGH (ref 70–99)
Glucose-Capillary: 374 mg/dL — ABNORMAL HIGH (ref 70–99)
Glucose-Capillary: 375 mg/dL — ABNORMAL HIGH (ref 70–99)
Glucose-Capillary: 378 mg/dL — ABNORMAL HIGH (ref 70–99)
Glucose-Capillary: 411 mg/dL — ABNORMAL HIGH (ref 70–99)
Glucose-Capillary: 415 mg/dL — ABNORMAL HIGH (ref 70–99)
Glucose-Capillary: 422 mg/dL — ABNORMAL HIGH (ref 70–99)
Glucose-Capillary: 426 mg/dL — ABNORMAL HIGH (ref 70–99)

## 2019-12-01 LAB — LIPASE, BLOOD: Lipase: 44 U/L (ref 11–51)

## 2019-12-01 LAB — MAGNESIUM: Magnesium: 2.6 mg/dL — ABNORMAL HIGH (ref 1.7–2.4)

## 2019-12-01 LAB — PHOSPHORUS: Phosphorus: 3.3 mg/dL (ref 2.5–4.6)

## 2019-12-01 MED ORDER — LABETALOL HCL 5 MG/ML IV SOLN
10.0000 mg | INTRAVENOUS | Status: DC | PRN
  Administered 2019-12-01 – 2019-12-02 (×3): 10 mg via INTRAVENOUS
  Filled 2019-12-01 (×3): qty 4

## 2019-12-01 MED ORDER — LABETALOL HCL 5 MG/ML IV SOLN: 10.0000 mg | INTRAVENOUS | Status: DC | PRN

## 2019-12-01 MED ORDER — INSULIN GLARGINE 100 UNIT/ML ~~LOC~~ SOLN
15.0000 [IU] | Freq: Every day | SUBCUTANEOUS | Status: DC
  Administered 2019-12-01: 15 [IU] via SUBCUTANEOUS
  Filled 2019-12-01: qty 0.15

## 2019-12-01 NOTE — Progress Notes (Signed)
PROGRESS NOTE    Brett [name]  HTD:[id number] DOB: 03-26-1935 DOA: 11/25/2019 PCP: Perrin Maltese, MD      Assessment & Plan:   Active Problems:   B-cell lymphoma (HCC)   COPD (chronic obstructive pulmonary disease) (HCC)   Sepsis (HCC)   HTN (hypertension)   Diabetes (HCC)   CAD (coronary artery disease)   Chronic diastolic heart failure (HCC)   Stroke (HCC)   COPD exacerbation (HCC)   Type 2 diabetes mellitus with hyperosmolar nonketotic hyperglycemia (HCC)   AF (paroxysmal atrial fibrillation) (HCC)   Acute encephalopathy   High serum osmolar gap   Dysphagia following unspecified cerebrovascular disease   Possibly acute metabolic encephalopathy: worse today, pt is more confused. Not at pt's baseline as per pt's MPOA, pt is normally alert & oriented & able to ambulate independently. MPOA is pt's friend [name]. CT brain & MRI brain showed no acute abnormalities. Repeat CT scan shows no acute abnormalities. PT/OT consulted but unable to complete at this time. EEG shows slowing but no epileptiform activity. Neuro following and recs apprec. Speech following and recs apprec   Dysphagia: NPO. Continue w/ NG tube and continue tube feeds. Speech & nutrition following and recs apprec   Sepsis: etiology unclear. Blood cxs NGTD & urine cx shows no growth. Tylenol prn for fevers. Completed abx course.  Possible pneumonia: possible atypical or gram positive. Strep, mycoplasma, legionella are neg. Completed abx course. Continue on supplemental oxygen and wean as tolerated. Nebs prn   Acute hypoxic respiratory failure: improving. Etiology unclear, possible CHF exacerbation and/or pneumonia. Will hold off on lasix today secondary to AKI. Completed abx course. Continue on supplemental oxygen and wean as tolerated.  Acute on chronic diastolic heart failure: will hold off on lasix today secondary to AKI. Monitor I/Os.   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 Cr is labile.  Avoid nephrotoxic meds.  Will continue to monitor    B-cell lymphoma: chronic & currently in remission  COPD: continue nebs, incentive spirometry. VBG obtained no evidence of CO2 retention to explain mental status changes.  HTN: IV hydralazine prn   CAD: chronic & stable. When able to tolerate p.o. resume home medications  DM2: poorly controlled. Continue on lantus and SSI w/ accuchecks   Possible PAF: IV metoprolol prn for HR >130. Patient apparently not anticoagulation he has amiodarone with his list of medication & his MPOA was unaware the patient has atrial fibrillation.   Elevated lactic acid: resolved. Etiology unclear.   Abdominal distention: etiology unclear. KUB & CT abd/pelvis shows no acute abnormailities   Transaminitis: etiology unclear. Resolved  Normocytic anemia: etiology unclear. No need for a transfusion at this time. Will continue to monitor   Right eye blindness: chronic as per pt's MPOA   DVT prophylaxis: lovenox Code Status: DNR Family Communication: Pt is estranged from his family who evidently lives in [place] and [name].  Disposition Plan:   Consultants:  Neuro   Procedures: n/a   Antimicrobials: completed abx course   Subjective: Pt is awake but more confused today.   Objective: Vitals:   11/30/19 1608 11/30/19 1709 11/30/19 2013 12/01/19 0004  BP:  (!) 160/79  (!) 149/71  Pulse:  (!) 122  (!) 125  Resp:  18  17  Temp:  99.3 F (37.4 C)  98.5 F (36.9 C)  TempSrc:  Oral    SpO2: 96% 100% 96% 99%  Weight:      Height:  Intake/Output Summary (Last 24 hours) at 12/01/2019 0713 Last data filed at 12/01/2019 0454 Gross per 24 hour  Intake --  Output 700 ml  Net -700 ml   Filed Weights   11/25/19 1430 11/27/19 0839 11/30/19 0500  Weight: 91 kg 93.2 kg 89.6 kg    Examination:  General exam: Appears restless  Respiratory system:  decreased breath sounds b/l. No rales Cardiovascular system: S1 & S2 +. No rubs,  gallops or clicks. B/l LE  2+ pitting edema Gastrointestinal system: Abdomen is obese, soft and nontender.  hypoactive bowel sounds heard. Central nervous system: Awake but more confused today. Moves all 4 extremities  Psychiatry: Judgement and insight appear abnormal. Flat mood and affect      Data Reviewed: I have personally reviewed following labs and imaging studies  CBC: Recent Labs  Lab 11/25/19 1450 11/27/19 0157 11/28/19 0617 11/29/19 0315 11/30/19 0627 12/01/19 0332  WBC 6.2 7.1 7.7 7.0 7.7 8.8  NEUTROABS 4.6  --   --   --   --   --   HGB 10.7* 10.4* 11.3* 10.8* 10.9* 11.3*  HCT 32.1* 31.4* 34.4* 35.1* 35.8* 37.6*  MCV 83.4 83.1 83.5 87.8 88.8 89.7  PLT 163 161 191 186 207 846   Basic Metabolic Panel: Recent Labs  Lab 11/28/19 0617 11/29/19 0315 11/30/19 0627 11/30/19 2016 12/01/19 0332  NA 140 143 144 140 143  K 4.3 3.7 3.8 3.5 3.5  CL 102 104 106 104 106  CO2 24 27 25 28 26   GLUCOSE 189* 153* 217* 547* 453*  BUN 22 33* 39* 40* 39*  CREATININE 1.83* 1.98* 1.70* 2.00* 2.01*  CALCIUM 8.0* 7.9* 8.2* 8.1* 8.3*  MG 2.0 2.4 2.7*  --  2.6*  PHOS 4.0 4.1 3.8  --  3.3   GFR: Estimated Creatinine Clearance: 30.8 mL/min (A) (by C-G formula based on SCr of 2.01 mg/dL (H)). Liver Function Tests: Recent Labs  Lab 11/25/19 1450 11/26/19 1728 11/27/19 0157 11/28/19 0617  AST 62* 48* 46* 38  ALT 77* 61* 54* 44  ALKPHOS 119 102 98 86  BILITOT 0.7 0.8 0.7 0.8  PROT 6.7 6.2* 6.2* 5.8*  ALBUMIN 3.6 3.2* 3.2* 2.8*   Recent Labs  Lab 12/01/19 0332  LIPASE 44   Recent Labs  Lab 11/26/19 0058  AMMONIA 26   Coagulation Profile: No results for input(s): INR, PROTIME in the last 168 hours. Cardiac Enzymes: Recent Labs  Lab 11/25/19 1450  CKTOTAL 164   BNP (last 3 results) No results for input(s): PROBNP in the last 8760 hours. HbA1C: No results for input(s): HGBA1C in the last 72 hours. CBG: Recent Labs  Lab 11/30/19 1230 11/30/19 1710  11/30/19 1955 12/01/19 0011 12/01/19 0401  GLUCAP 186* 430* 542* 411* 426*   Lipid Profile: No results for input(s): CHOL, HDL, LDLCALC, TRIG, CHOLHDL, LDLDIRECT in the last 72 hours. Thyroid Function Tests: No results for input(s): TSH, T4TOTAL, FREET4, T3FREE, THYROIDAB in the last 72 hours. Anemia Panel: No results for input(s): VITAMINB12, FOLATE, FERRITIN, TIBC, IRON, RETICCTPCT in the last 72 hours. Sepsis Labs: Recent Labs  Lab 11/25/19 1450 11/25/19 2204 11/26/19 0058  LATICACIDVEN 3.7* 3.0* 1.9    Recent Results (from the past 240 hour(s))  Urine Culture     Status: Abnormal   Collection Time: 11/25/19  2:37 PM   Specimen: Urine, Random  Result Value Ref Range Status   Specimen Description   Final    URINE, RANDOM Performed at [facility],  [place], [place] [address]    Special Requests   Final    NONE Performed at [facility], [address] [place], [place] [address]    Culture (A)  Final    <10,000 COLONIES/mL INSIGNIFICANT GROWTH Performed at [place] [address], [place], [place] [address]    Report Status 11/27/2019 FINAL  Final  Respiratory Panel by RT PCR (Flu A&B, Covid) - Urine, Clean Catch     Status: None   Collection Time: 11/25/19  4:22 PM   Specimen: Urine, Clean Catch  Result Value Ref Range Status   SARS Coronavirus 2 by RT PCR NEGATIVE NEGATIVE Final    Comment: (NOTE) SARS-CoV-2 target nucleic acids are NOT DETECTED. The SARS-CoV-2 RNA is generally detectable in upper respiratoy specimens during the acute phase of infection. The lowest concentration of SARS-CoV-2 viral copies this assay can detect is 131 copies/mL. A negative result does not preclude SARS-Cov-2 infection and should not be used as the sole basis for treatment or other patient management decisions. A negative result may occur with  improper specimen collection/handling, submission of specimen other than nasopharyngeal  swab, presence of viral mutation(s) within the areas targeted by this assay, and inadequate number of viral copies (<131 copies/mL). A negative result must be combined with clinical observations, patient history, and epidemiological information. The expected result is Negative. Fact Sheet for Patients:  PinkCheek.be Fact Sheet for Healthcare Providers:  GravelBags.it This test is not yet ap proved or cleared by the [place] FDA and  has been authorized for detection and/or diagnosis of SARS-CoV-2 by FDA under an Emergency Use Authorization (EUA). This EUA will remain  in effect (meaning this test can be used) for the duration of the COVID-19 declaration under Section 564(b)(1) of the Act, 21 U.S.C. section 360bbb-3(b)(1), unless the authorization is terminated or revoked sooner.    Influenza A by PCR NEGATIVE NEGATIVE Final   Influenza B by PCR NEGATIVE NEGATIVE Final    Comment: (NOTE) The Xpert Xpress SARS-CoV-2/FLU/RSV assay is intended as an aid in  the diagnosis of influenza from Nasopharyngeal swab specimens and  should not be used as a sole basis for treatment. Nasal washings and  aspirates are unacceptable for Xpert Xpress SARS-CoV-2/FLU/RSV  testing. Fact Sheet for Patients: PinkCheek.be Fact Sheet for Healthcare Providers: GravelBags.it This test is not yet approved or cleared by the [place] FDA and  has been authorized for detection and/or diagnosis of SARS-CoV-2 by  FDA under an Emergency Use Authorization (EUA). This EUA will remain  in effect (meaning this test can be used) for the duration of the  Covid-19 declaration under Section 564(b)(1) of the Act, 21  U.S.C. section 360bbb-3(b)(1), unless the authorization is  terminated or revoked. Performed at [facility], [address] [place], [place] [address]   CULTURE, BLOOD  (ROUTINE X 2) w Reflex to ID Panel     Status: None   Collection Time: 11/26/19 10:51 AM   Specimen: BLOOD  Result Value Ref Range Status   Specimen Description BLOOD RIGHT ANTECUBITAL  Final   Special Requests   Final    BOTTLES DRAWN AEROBIC AND ANAEROBIC Blood Culture adequate volume   Culture   Final    NO GROWTH 5 DAYS Performed at [facility], [address] [place],  [address]    Report Status 12/01/2019 FINAL  Final  CULTURE, BLOOD (ROUTINE X 2) w Reflex to ID Panel  Status: None   Collection Time: 11/26/19 10:51 AM   Specimen: BLOOD  Result Value Ref Range Status   Specimen Description BLOOD BLOOD LEFT FOREARM  Final   Special Requests   Final    BOTTLES DRAWN AEROBIC AND ANAEROBIC Blood Culture adequate volume   Culture   Final    NO GROWTH 5 DAYS Performed at [facility], [address] [place], [place] [address]    Report Status 12/01/2019 FINAL  Final  Urine Culture     Status: None   Collection Time: 11/27/19  3:53 PM   Specimen: Urine, Random  Result Value Ref Range Status   Specimen Description   Final    URINE, RANDOM Performed at [facility], [address], [place], [place] [address]    Special Requests   Final    NONE Performed at [facility], [address] [place], [place] [address]    Culture   Final    NO GROWTH Performed at [facility], [place] [address], [place], [place] [address]    Report Status 11/28/2019 FINAL  Final  MRSA PCR Screening     Status: None   Collection Time: 11/27/19  3:55 PM   Specimen: Nasopharyngeal  Result Value Ref Range Status   MRSA by PCR NEGATIVE NEGATIVE Final    Comment:        The GeneXpert MRSA Assay (FDA approved for NASAL specimens only), is one component of a comprehensive MRSA colonization surveillance program. It is not intended to diagnose MRSA infection nor to guide or monitor treatment for MRSA infections. Performed at [facility], [address], [place], [place] [address]          Radiology Studies: DG Chest 1 View  Result Date: 11/30/2019 CLINICAL DATA:  Confirm NG tube placement. EXAM: CHEST  1 VIEW COMPARISON:  Chest and abdominal radiographs yesterday. FINDINGS: Enteric tube tip is below the diaphragm, not included in the field of view. Enteric side-port is not seen on the current exam. Unchanged heart size and mediastinal contours. Vascular congestion. Possible small pleural effusions/atelectasis at the bases. Patient's chin obscures the right lung apex. IMPRESSION: 1. Enteric tube tip below the diaphragm, not included in the field of view. 2. Vascular congestion with possible small pleural effusions/atelectasis at the bases. Electronically Signed   By: Keith Rake M.D.   On: 11/30/2019 06:22   DG Abd 1 View  Result Date: 11/30/2019 CLINICAL DATA:  Nasogastric tube placement. EXAM: ABDOMEN - 1 VIEW COMPARISON:  November 29, 2019. FINDINGS: The bowel gas pattern is normal. Distal tip of nasogastric tube is seen in expected position of distal stomach. No radio-opaque calculi or other significant radiographic abnormality are seen. IMPRESSION: Distal tip of nasogastric tube is seen in expected position of distal stomach. No evidence of bowel obstruction or ileus. Electronically Signed   By: Marijo Conception M.D.   On: 11/30/2019 08:53   DG Abd 1 View  Result Date: 11/29/2019 CLINICAL DATA:  Nasogastric tube. EXAM: ABDOMEN - 1 VIEW COMPARISON:  Same day. FINDINGS: The bowel gas pattern is normal. Nasogastric tube tip is seen in the stomach. No radio-opaque calculi or other significant radiographic abnormality are seen. IMPRESSION: Nasogastric tube seen in the stomach. No abnormal bowel dilatation is noted. Electronically Signed   By: Marijo Conception M.D.   On: 11/29/2019 21:19   DG Abd 1 View  Result Date: 11/29/2019 CLINICAL DATA:  NG tube placement EXAM: ABDOMEN -  1 VIEW COMPARISON:  Abdominal radiograph  11/25/2019 FINDINGS: Interval placement of a nasogastric tube with side port projecting at or just beyond the GE junction. Limited view of the upper abdomen without acute abnormality identified. IMPRESSION: Interval placement of a nasogastric tube with side port projecting at or just beyond the GE junction. Recommend advancement for more optimal positioning, approximately 5-6 cm. Electronically Signed   By: Audie Pinto M.D.   On: 11/29/2019 20:02   DG Chest Port 1 View  Result Date: 11/29/2019 CLINICAL DATA:  Nasogastric tube insertion EXAM: PORTABLE CHEST 1 VIEW COMPARISON:  11/27/2019 FINDINGS: Interval placement of nasogastric tube with side hole at the level of the GE junction and distal tip within the proximal stomach. The heart size and mediastinal contours are stable. Probable small bilateral pleural effusions. IMPRESSION: 1. Interval placement of nasogastric tube with side hole at the level of the GE junction, recommend advancement approximately 5 cm. 2. Probable small bilateral pleural effusions. Electronically Signed   By: Davina Poke D.O.   On: 11/29/2019 19:01        Scheduled Meds: .  stroke: mapping our early stages of recovery book   Does not apply Once  . enoxaparin (LOVENOX) injection  40 mg Subcutaneous Q24H  . feeding supplement (PRO-STAT SUGAR FREE 64)  30 mL Per Tube Daily  . free water  30 mL Per Tube Q4H  . insulin aspart  0-20 Units Subcutaneous Q4H  . insulin glargine  10 Units Subcutaneous QHS  . ipratropium-albuterol  3 mL Nebulization Once  . ipratropium-albuterol  3 mL Nebulization TID  . mouth rinse  15 mL Mouth Rinse BID  . pantoprazole sodium  40 mg Per Tube Daily   Continuous Infusions: . sodium chloride Stopped (11/27/19 0820)  . feeding supplement (JEVITY 1.2 CAL) 1,000 mL (11/30/19 2252)     LOS: 5 days    Time spent: 30 mins    Wyvonnia Dusky, MD [facility] Pager [phone]  If 7PM-7AM, please contact  night-coverage [email or web address] Password [facility] 12/01/2019, 7:13 AM

## 2019-12-01 NOTE — Progress Notes (Addendum)
SLP Cancellation Note  Patient Details Name: Brett [name] MRN: [id number] DOB: Apr 23, 1935   Cancelled treatment:       Reason Eval/Treat Not Completed: Patient with limited participation; Attempted to assess tolerance of current diet of puree/HTL (via tsp). Currently presenting with O2 via [place] and NGT. Patient demonstrated confusion/agitation during attempted session, refusing to increase HOB elevation, re-positioning in bed for PO intake, and PO trials today. Skilled ST services not provided today d/t decreased participation in services. Will re-assess at later date.   Loni Beckwith, M.S. CCC-SLP Speech-Language Pathologist  Loni Beckwith 12/01/2019, 11:48 AM

## 2019-12-01 NOTE — Progress Notes (Signed)
Patient's blood sugar 426 appr 1 hour after 20 units of novolog per on call provider order of Sharion Settler NP. Notified her and received no new orders. Does not wish to stop feedings as well.

## 2019-12-01 NOTE — Plan of Care (Signed)

## 2019-12-01 NOTE — Progress Notes (Signed)
Patient's HR 120-130. Discussed with on call provider Sharion Settler NP. Patient is asymptomatic. BP 149/71. No new orders received.

## 2019-12-01 NOTE — Progress Notes (Signed)
Blood sugar elevated various times, discussed with on call provider Sharion Settler NP. Provider put new orders in for novolog parameters as well as one time dose of novolog 25 units.

## 2019-12-01 NOTE — Plan of Care (Signed)
Pt confused. Disoriented x4. Unable to follow commands.  Labetalol given x2 for HR in the 120's with improvement.  Rest of VSS.  Pt did not want to eat today, had few bites.  Continuous feeding via NG tube continues.

## 2019-12-02 DIAGNOSIS — E870 Hyperosmolality and hypernatremia: Secondary | ICD-10-CM

## 2019-12-02 LAB — BASIC METABOLIC PANEL
Anion gap: 12 (ref 5–15)
BUN: 41 mg/dL — ABNORMAL HIGH (ref 8–23)
CO2: 26 mmol/L (ref 22–32)
Calcium: 8.5 mg/dL — ABNORMAL LOW (ref 8.9–10.3)
Chloride: 109 mmol/L (ref 98–111)
Creatinine, Ser: 1.98 mg/dL — ABNORMAL HIGH (ref 0.61–1.24)
GFR calc Af Amer: 35 mL/min — ABNORMAL LOW (ref >60)
GFR calc non Af Amer: 31 mL/min — ABNORMAL LOW (ref >60)
Glucose, Bld: 398 mg/dL — ABNORMAL HIGH (ref 70–99)
Potassium: 3.7 mmol/L (ref 3.5–5.1)
Sodium: 147 mmol/L — ABNORMAL HIGH (ref 135–145)

## 2019-12-02 LAB — GLUCOSE, CAPILLARY
Glucose-Capillary: 286 mg/dL — ABNORMAL HIGH (ref 70–99)
Glucose-Capillary: 297 mg/dL — ABNORMAL HIGH (ref 70–99)
Glucose-Capillary: 312 mg/dL — ABNORMAL HIGH (ref 70–99)
Glucose-Capillary: 334 mg/dL — ABNORMAL HIGH (ref 70–99)
Glucose-Capillary: 346 mg/dL — ABNORMAL HIGH (ref 70–99)
Glucose-Capillary: 350 mg/dL — ABNORMAL HIGH (ref 70–99)
Glucose-Capillary: 365 mg/dL — ABNORMAL HIGH (ref 70–99)
Glucose-Capillary: 382 mg/dL — ABNORMAL HIGH (ref 70–99)

## 2019-12-02 LAB — CBC
HCT: 34.4 % — ABNORMAL LOW (ref 39.0–52.0)
Hemoglobin: 11 g/dL — ABNORMAL LOW (ref 13.0–17.0)
MCH: 27.4 pg (ref 26.0–34.0)
MCHC: 32 g/dL (ref 30.0–36.0)
MCV: 85.8 fL (ref 80.0–100.0)
Platelets: 239 10*3/uL (ref 150–400)
RBC: 4.01 MIL/uL — ABNORMAL LOW (ref 4.22–5.81)
RDW: 15.4 % (ref 11.5–15.5)
WBC: 11.3 10*3/uL — ABNORMAL HIGH (ref 4.0–10.5)
nRBC: 0 % (ref 0.0–0.2)

## 2019-12-02 LAB — MAGNESIUM: Magnesium: 2.6 mg/dL — ABNORMAL HIGH (ref 1.7–2.4)

## 2019-12-02 LAB — PHOSPHORUS: Phosphorus: 3.5 mg/dL (ref 2.5–4.6)

## 2019-12-02 LAB — VITAMIN B1: Vitamin B1 (Thiamine): 119.8 nmol/L (ref 66.5–200.0)

## 2019-12-02 MED ORDER — INSULIN GLARGINE 100 UNIT/ML ~~LOC~~ SOLN
20.0000 [IU] | Freq: Every day | SUBCUTANEOUS | Status: DC
  Administered 2019-12-02: 23:00:00 20 [IU] via SUBCUTANEOUS
  Filled 2019-12-02 (×2): qty 0.2

## 2019-12-02 NOTE — Progress Notes (Signed)
Subjective: Patient further improved today.  Able to hold a conversation.  Follows commands.  Report she does not want to eat the applesauce consistency food because that gives him diarrhea.    Objective: Current vital signs: BP (!) 167/71 (BP Location: Left Arm)   Pulse (!) 118   Temp 98.6 F (37 C) (Oral)   Resp 18   Ht 5\' 8"  (1.727 m)   Wt 89.8 kg   SpO2 100%   BMI 30.10 kg/m  Vital signs in last 24 hours: Temp:  [98.6 F (37 C)-99.4 F (37.4 C)] 98.6 F (37 C) (01/03 0810) Pulse Rate:  [105-129] 118 (01/03 0810) Resp:  [16-18] 18 (01/03 0810) BP: (130-167)/(66-77) 167/71 (01/03 0810) SpO2:  [92 %-100 %] 100 % (01/03 0810) Weight:  [89.8 kg] 89.8 kg (01/03 0500)  Intake/Output from previous day: 01/02 0701 - 01/03 0700 In: 1893 [NG/GT:1893] Out: 975 [Urine:975] Intake/Output this shift: No intake/output data recorded. Nutritional status:  Diet Order            DIET - DYS 1 Room service appropriate? Yes with Assist; Fluid consistency: Honey Thick  Diet effective now              Neurologic Exam: Mental Status: Alert.  Speech fluent with some dysarthria.  Able to follow simple commands without difficulty. Cranial Nerves: VE:LFYBOF equal, round, reactive to light and accommodation III,IV, VI: ptosis not present V,VII:mild left facial droop, facial light touch sensation normal bilaterally VIII: hearing normal bilaterally IX,X: gag reflex present BP:ZWCHEN to test IDP:OEUMPN to test Motor: Moves all extremities against gravity spontaneously and to command.  No focal weakness appreciated   Lab Results: Basic Metabolic Panel: Recent Labs  Lab 11/28/19 0617 11/29/19 0315 11/30/19 0627 11/30/19 2016 12/01/19 0332 12/02/19 0759  NA 140 143 144 140 143 147*  K 4.3 3.7 3.8 3.5 3.5 3.7  CL 102 104 106 104 106 109  CO2 24 27 25 28 26 26   GLUCOSE 189* 153* 217* 547* 453* 398*  BUN 22 33* 39* 40* 39* 41*  CREATININE 1.83* 1.98* 1.70* 2.00* 2.01* 1.98*   CALCIUM 8.0* 7.9* 8.2* 8.1* 8.3* 8.5*  MG 2.0 2.4 2.7*  --  2.6* 2.6*  PHOS 4.0 4.1 3.8  --  3.3 3.5    Liver Function Tests: Recent Labs  Lab 11/25/19 1450 11/26/19 1728 11/27/19 0157 11/28/19 0617  AST 62* 48* 46* 38  ALT 77* 61* 54* 44  ALKPHOS 119 102 98 86  BILITOT 0.7 0.8 0.7 0.8  PROT 6.7 6.2* 6.2* 5.8*  ALBUMIN 3.6 3.2* 3.2* 2.8*   Recent Labs  Lab 12/01/19 0332  LIPASE 44   Recent Labs  Lab 11/26/19 0058  AMMONIA 26    CBC: Recent Labs  Lab 11/25/19 1450 11/28/19 0617 11/29/19 0315 11/30/19 0627 12/01/19 0332 12/02/19 0759  WBC 6.2 7.7 7.0 7.7 8.8 11.3*  NEUTROABS 4.6  --   --   --   --   --   HGB 10.7* 11.3* 10.8* 10.9* 11.3* 11.0*  HCT 32.1* 34.4* 35.1* 35.8* 37.6* 34.4*  MCV 83.4 83.5 87.8 88.8 89.7 85.8  PLT 163 191 186 207 236 239    Cardiac Enzymes: Recent Labs  Lab 11/25/19 1450  CKTOTAL 164    Lipid Panel: No results for input(s): CHOL, TRIG, HDL, CHOLHDL, VLDL, LDLCALC in the last 168 hours.  CBG: Recent Labs  Lab 12/01/19 2034 12/02/19 0056 12/02/19 0535 12/02/19 0620 12/02/19 0809  GLUCAP 328* 346* 297*  46* 350*    Microbiology: Results for orders placed or performed during the hospital encounter of 11/25/19  Urine Culture     Status: Abnormal   Collection Time: 11/25/19  2:37 PM   Specimen: Urine, Random  Result Value Ref Range Status   Specimen Description   Final    URINE, RANDOM Performed at [facility], [address], [place], [place] [address]    Special Requests   Final    NONE Performed at [facility], [address], [place], [place] [address]    Culture (A)  Final    <10,000 COLONIES/mL INSIGNIFICANT GROWTH Performed at [facility] [address] [place], [place] [address]    Report Status 11/27/2019 FINAL  Final  Respiratory Panel by RT PCR (Flu A&B, Covid) - Urine, Clean Catch     Status: None   Collection Time: 11/25/19  4:22 PM   Specimen: Urine, Clean Catch   Result Value Ref Range Status   SARS Coronavirus 2 by RT PCR NEGATIVE NEGATIVE Final    Comment: (NOTE) SARS-CoV-2 target nucleic acids are NOT DETECTED. The SARS-CoV-2 RNA is generally detectable in upper respiratoy specimens during the acute phase of infection. The lowest concentration of SARS-CoV-2 viral copies this assay can detect is 131 copies/mL. A negative result does not preclude SARS-Cov-2 infection and should not be used as the sole basis for treatment or other patient management decisions. A negative result may occur with  improper specimen collection/handling, submission of specimen other than nasopharyngeal swab, presence of viral mutation(s) within the areas targeted by this assay, and inadequate number of viral copies (<131 copies/mL). A negative result must be combined with clinical observations, patient history, and epidemiological information. The expected result is Negative. Fact Sheet for Patients:  PinkCheek.be Fact Sheet for Healthcare Providers:  GravelBags.it This test is not yet ap proved or cleared by the [place] FDA and  has been authorized for detection and/or diagnosis of SARS-CoV-2 by FDA under an Emergency Use Authorization (EUA). This EUA will remain  in effect (meaning this test can be used) for the duration of the COVID-19 declaration under Section 564(b)(1) of the Act, 21 U.S.C. section 360bbb-3(b)(1), unless the authorization is terminated or revoked sooner.    Influenza A by PCR NEGATIVE NEGATIVE Final   Influenza B by PCR NEGATIVE NEGATIVE Final    Comment: (NOTE) The Xpert Xpress SARS-CoV-2/FLU/RSV assay is intended as an aid in  the diagnosis of influenza from Nasopharyngeal swab specimens and  should not be used as a sole basis for treatment. Nasal washings and  aspirates are unacceptable for Xpert Xpress SARS-CoV-2/FLU/RSV  testing. Fact Sheet for  Patients: PinkCheek.be Fact Sheet for Healthcare Providers: GravelBags.it This test is not yet approved or cleared by the [place] FDA and  has been authorized for detection and/or diagnosis of SARS-CoV-2 by  FDA under an Emergency Use Authorization (EUA). This EUA will remain  in effect (meaning this test can be used) for the duration of the  Covid-19 declaration under Section 564(b)(1) of the Act, 21  U.S.C. section 360bbb-3(b)(1), unless the authorization is  terminated or revoked. Performed at [facility], [address] [place], [place] [address]   CULTURE, BLOOD (ROUTINE X 2) w Reflex to ID Panel     Status: None   Collection Time: 11/26/19 10:51 AM   Specimen: BLOOD  Result Value Ref Range Status   Specimen Description BLOOD RIGHT ANTECUBITAL  Final   Special Requests  Final    BOTTLES DRAWN AEROBIC AND ANAEROBIC Blood Culture adequate volume   Culture   Final    NO GROWTH 5 DAYS Performed at [facility], [address] [place], [place] [address]    Report Status 12/01/2019 FINAL  Final  CULTURE, BLOOD (ROUTINE X 2) w Reflex to ID Panel     Status: None   Collection Time: 11/26/19 10:51 AM   Specimen: BLOOD  Result Value Ref Range Status   Specimen Description BLOOD BLOOD LEFT FOREARM  Final   Special Requests   Final    BOTTLES DRAWN AEROBIC AND ANAEROBIC Blood Culture adequate volume   Culture   Final    NO GROWTH 5 DAYS Performed at [facility], [address], [place], [place] [address]    Report Status 12/01/2019 FINAL  Final  Urine Culture     Status: None   Collection Time: 11/27/19  3:53 PM   Specimen: Urine, Random  Result Value Ref Range Status   Specimen Description   Final    URINE, RANDOM Performed at [facility], [address], [place], [place] [address]    Special Requests   Final    NONE Performed at [facility], [address] [place], [place] [address]    Culture   Final    NO GROWTH Performed at [facility], [place] [address] [place], [place] [address]    Report Status 11/28/2019 FINAL  Final  MRSA PCR Screening     Status: None   Collection Time: 11/27/19  3:55 PM   Specimen: Nasopharyngeal  Result Value Ref Range Status   MRSA by PCR NEGATIVE NEGATIVE Final    Comment:        The GeneXpert MRSA Assay (FDA approved for NASAL specimens only), is one component of a comprehensive MRSA colonization surveillance program. It is not intended to diagnose MRSA infection nor to guide or monitor treatment for MRSA infections. Performed at [facility], [address] [place], [place] [address]     Coagulation Studies: No results for input(s): LABPROT, INR in the last 72 hours.  Imaging: No results found.  Medications:  I have reviewed the patient's current medications. Scheduled: .  stroke: mapping our early stages of recovery book   Does not apply Once  . enoxaparin (LOVENOX) injection  40 mg Subcutaneous Q24H  . feeding supplement (PRO-STAT SUGAR FREE 64)  30 mL Per Tube Daily  . free water  30 mL Per Tube Q4H  . insulin aspart  0-20 Units Subcutaneous Q4H  . insulin glargine  20 Units Subcutaneous QHS  . ipratropium-albuterol  3 mL Nebulization Once  . ipratropium-albuterol  3 mL Nebulization TID  . mouth rinse  15 mL Mouth Rinse BID  . pantoprazole sodium  40 mg Per Tube Daily    Assessment/Plan: 84 y.o.malewith a history of multiple medical problems admitted with altered mental status. MRI of the brain reviewed and shows no acute changes. Patient with multiple metabolic issues though including elevated LFT's, elevated creatinine, hyperkalemia, hyponatremia and elevated blood sugars. Patient also febrile and started on antibiotics. Mental status likely multifactorial in etiology and related to above issues. Although these issues are slowly improving it would not be  unusual for the patient's cognitive status to lag behind improvement in the lab work.EEG shows no epileptiform activity. Patient remains nonfocal. Moves all extremities spontaneously and against gravity. Follows commands.  Thiamine pending.  Recommendations: 1. Agree with current  management.  No further neurologic intervention is recommended at this time.  If further questions arise, please call or page at that time.  Thank you for allowing neurology to participate in the care of this patient.   LOS: 6 days   Alexis Goodell, MD Neurology [phone] 12/02/2019  8:39 AM

## 2019-12-02 NOTE — Progress Notes (Signed)
Lateral sides of patient's abdomen reddened, hot to the touch. Notified on call provider Sharion Settler NP. Advised to relay message to day shift RN. PRN labetalol given 1X for HR at 120. HR down to 115.

## 2019-12-02 NOTE — Progress Notes (Addendum)
PROGRESS NOTE    Brett [name]  DGU:[id number] DOB: 1934-12-01 DOA: 11/25/2019 PCP: Perrin Maltese, MD      Assessment & Plan:   Active Problems:   B-cell lymphoma (HCC)   COPD (chronic obstructive pulmonary disease) (HCC)   Sepsis (HCC)   HTN (hypertension)   Diabetes (HCC)   CAD (coronary artery disease)   Chronic diastolic heart failure (HCC)   Stroke (HCC)   COPD exacerbation (HCC)   Type 2 diabetes mellitus with hyperosmolar nonketotic hyperglycemia (HCC)   AF (paroxysmal atrial fibrillation) (HCC)   Acute encephalopathy   High serum osmolar gap   Dysphagia following unspecified cerebrovascular disease   Possibly acute metabolic encephalopathy: unchanged from day prior, pt is still confused. Not at pt's baseline as per pt's MPOA, pt is normally alert & oriented & able to ambulate independently. MPOA is pt's friend Brett [name]. CT brain & MRI brain showed no acute abnormalities. Repeat CT scan shows no acute abnormalities. PT/OT ordered but pt unable to participate at this time. EEG shows slowing but no epileptiform activity. Neuro following and recs apprec. Speech following and recs apprec   Dysphagia: NPO. Continue w/ NG tube and continue tube feeds. Speech & nutrition following and recs apprec   Hypernatremia: free water deficit 0.8L. Continue w/ free water pushes   Sepsis: etiology unclear. Blood cxs NGTD & urine cx shows no growth. Tylenol prn for fevers. Completed abx course. Resolved  Possible pneumonia: possible atypical or gram positive. Strep, mycoplasma, legionella are neg. Completed abx course. Continue on supplemental oxygen and wean as tolerated. Nebs prn   Acute hypoxic respiratory failure: continues to improve. Etiology unclear, possible CHF exacerbation and/or pneumonia. Will hold off on lasix secondary to AKI. Completed abx course. Continue on supplemental oxygen and wean as tolerated.  Acute on chronic diastolic heart failure: will hold off on  lasix today secondary to AKI. Monitor I/Os.   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 Cr is labile. Avoid nephrotoxic meds.  Will continue to monitor    B-cell lymphoma: chronic & currently in remission  COPD: continue nebs, incentive spirometry. VBG obtained no evidence of CO2 retention to explain mental status changes.  HTN: IV hydralazine prn   CAD: chronic & stable. When able to tolerate p.o. resume home medications  DM2: poorly controlled. Increased lantus to 20 units and SSI w/ accuchecks   Possible PAF: IV metoprolol prn for HR >130. Patient apparently not anticoagulation he has amiodarone with his list of medication & his MPOA was unaware the patient has atrial fibrillation.   Elevated lactic acid: resolved. Etiology unclear.   Abdominal distention: etiology unclear. KUB & CT abd/pelvis shows no acute abnormailities   Transaminitis: etiology unclear. Resolved  Normocytic anemia: etiology unclear. No need for a transfusion at this time. Will continue to monitor   Right eye blindness: chronic as per pt's MPOA   DVT prophylaxis: lovenox Code Status: DNR Family Communication: Pt is estranged from his family who evidently lives in [place] and [name]. Discussed pt's care w/ pt's MPOA, Brett [name], and answered all of his questions  Disposition Plan:   Consultants:  Neuro   Procedures: n/a   Antimicrobials: completed abx course   Subjective: Pt is still confused. Pt is unable to answer questions appropriately   Objective: Vitals:   12/02/19 0219 12/02/19 0500 12/02/19 0542 12/02/19 0810  BP:   (!) 151/73 (!) 167/71  Pulse: (!) 105  (!) 112 (!) 118  Resp:  18 18  Temp:   99.2 F (37.3 C) 98.6 F (37 C)  TempSrc:   Oral Oral  SpO2:   96% 100%  Weight:  89.8 kg    Height:        Intake/Output Summary (Last 24 hours) at 12/02/2019 1255 Last data filed at 12/02/2019 0900 Gross per 24 hour  Intake 0 ml  Output 975 ml  Net -975 ml   Filed  Weights   11/27/19 0839 11/30/19 0500 12/02/19 0500  Weight: 93.2 kg 89.6 kg 89.8 kg    Examination:  General exam: Appears restless  Respiratory system:  dimininshed breath sounds b/l. No rales Cardiovascular system: S1 & S2 +. No rubs, gallops or clicks. B/l LE  2+ pitting edema Gastrointestinal system: Abdomen is obese, soft and nontender.  normal bowel sounds heard. Central nervous system: Awake but still confused. Moves all 4 extremities  Psychiatry: Judgement and insight appear abnormal. Flat mood and affect      Data Reviewed: I have personally reviewed following labs and imaging studies  CBC: Recent Labs  Lab 11/25/19 1450 11/28/19 0617 11/29/19 0315 11/30/19 0627 12/01/19 0332 12/02/19 0759  WBC 6.2 7.7 7.0 7.7 8.8 11.3*  NEUTROABS 4.6  --   --   --   --   --   HGB 10.7* 11.3* 10.8* 10.9* 11.3* 11.0*  HCT 32.1* 34.4* 35.1* 35.8* 37.6* 34.4*  MCV 83.4 83.5 87.8 88.8 89.7 85.8  PLT 163 191 186 207 236 607   Basic Metabolic Panel: Recent Labs  Lab 11/28/19 0617 11/29/19 0315 11/30/19 0627 11/30/19 2016 12/01/19 0332 12/02/19 0759  NA 140 143 144 140 143 147*  K 4.3 3.7 3.8 3.5 3.5 3.7  CL 102 104 106 104 106 109  CO2 24 27 25 28 26 26   GLUCOSE 189* 153* 217* 547* 453* 398*  BUN 22 33* 39* 40* 39* 41*  CREATININE 1.83* 1.98* 1.70* 2.00* 2.01* 1.98*  CALCIUM 8.0* 7.9* 8.2* 8.1* 8.3* 8.5*  MG 2.0 2.4 2.7*  --  2.6* 2.6*  PHOS 4.0 4.1 3.8  --  3.3 3.5   GFR: Estimated Creatinine Clearance: 31.3 mL/min (A) (by C-G formula based on SCr of 1.98 mg/dL (H)). Liver Function Tests: Recent Labs  Lab 11/25/19 1450 11/26/19 1728 11/27/19 0157 11/28/19 0617  AST 62* 48* 46* 38  ALT 77* 61* 54* 44  ALKPHOS 119 102 98 86  BILITOT 0.7 0.8 0.7 0.8  PROT 6.7 6.2* 6.2* 5.8*  ALBUMIN 3.6 3.2* 3.2* 2.8*   Recent Labs  Lab 12/01/19 0332  LIPASE 44   Recent Labs  Lab 11/26/19 0058  AMMONIA 26   Coagulation Profile: No results for input(s): INR, PROTIME in  the last 168 hours. Cardiac Enzymes: Recent Labs  Lab 11/25/19 1450  CKTOTAL 164   BNP (last 3 results) No results for input(s): PROBNP in the last 8760 hours. HbA1C: No results for input(s): HGBA1C in the last 72 hours. CBG: Recent Labs  Lab 12/02/19 0056 12/02/19 0535 12/02/19 0620 12/02/19 0809 12/02/19 1041  GLUCAP 346* 297* 334* 350* 382*   Lipid Profile: No results for input(s): CHOL, HDL, LDLCALC, TRIG, CHOLHDL, LDLDIRECT in the last 72 hours. Thyroid Function Tests: No results for input(s): TSH, T4TOTAL, FREET4, T3FREE, THYROIDAB in the last 72 hours. Anemia Panel: No results for input(s): VITAMINB12, FOLATE, FERRITIN, TIBC, IRON, RETICCTPCT in the last 72 hours. Sepsis Labs: Recent Labs  Lab 11/25/19 1450 11/25/19 2204 11/26/19 0058  LATICACIDVEN 3.7* 3.0* 1.9  Recent Results (from the past 240 hour(s))  Urine Culture     Status: Abnormal   Collection Time: 11/25/19  2:37 PM   Specimen: Urine, Random  Result Value Ref Range Status   Specimen Description   Final    URINE, RANDOM Performed at [facility], [address], [place], [place] [address]    Special Requests   Final    NONE Performed at [facility], [address], [place], [place] [address]    Culture (A)  Final    <10,000 COLONIES/mL INSIGNIFICANT GROWTH Performed at [facility] [address], [place], [place] [address]    Report Status 11/27/2019 FINAL  Final  Respiratory Panel by RT PCR (Flu A&B, Covid) - Urine, Clean Catch     Status: None   Collection Time: 11/25/19  4:22 PM   Specimen: Urine, Clean Catch  Result Value Ref Range Status   SARS Coronavirus 2 by RT PCR NEGATIVE NEGATIVE Final    Comment: (NOTE) SARS-CoV-2 target nucleic acids are NOT DETECTED. The SARS-CoV-2 RNA is generally detectable in upper respiratoy specimens during the acute phase of infection. The lowest concentration of SARS-CoV-2 viral copies this assay can detect is 131  copies/mL. A negative result does not preclude SARS-Cov-2 infection and should not be used as the sole basis for treatment or other patient management decisions. A negative result may occur with  improper specimen collection/handling, submission of specimen other than nasopharyngeal swab, presence of viral mutation(s) within the areas targeted by this assay, and inadequate number of viral copies (<131 copies/mL). A negative result must be combined with clinical observations, patient history, and epidemiological information. The expected result is Negative. Fact Sheet for Patients:  PinkCheek.be Fact Sheet for Healthcare Providers:  GravelBags.it This test is not yet ap proved or cleared by the [place] FDA and  has been authorized for detection and/or diagnosis of SARS-CoV-2 by FDA under an Emergency Use Authorization (EUA). This EUA will remain  in effect (meaning this test can be used) for the duration of the COVID-19 declaration under Section 564(b)(1) of the Act, 21 U.S.C. section 360bbb-3(b)(1), unless the authorization is terminated or revoked sooner.    Influenza A by PCR NEGATIVE NEGATIVE Final   Influenza B by PCR NEGATIVE NEGATIVE Final    Comment: (NOTE) The Xpert Xpress SARS-CoV-2/FLU/RSV assay is intended as an aid in  the diagnosis of influenza from Nasopharyngeal swab specimens and  should not be used as a sole basis for treatment. Nasal washings and  aspirates are unacceptable for Xpert Xpress SARS-CoV-2/FLU/RSV  testing. Fact Sheet for Patients: PinkCheek.be Fact Sheet for Healthcare Providers: GravelBags.it This test is not yet approved or cleared by the [place] FDA and  has been authorized for detection and/or diagnosis of SARS-CoV-2 by  FDA under an Emergency Use Authorization (EUA). This EUA will remain  in effect (meaning this test can  be used) for the duration of the  Covid-19 declaration under Section 564(b)(1) of the Act, 21  U.S.C. section 360bbb-3(b)(1), unless the authorization is  terminated or revoked. Performed at [facility], [address] [place],  [address]   CULTURE, BLOOD (ROUTINE X 2) w Reflex to ID Panel     Status: None   Collection Time: 11/26/19 10:51 AM   Specimen: BLOOD  Result Value Ref Range Status   Specimen Description BLOOD RIGHT ANTECUBITAL  Final   Special Requests   Final    BOTTLES DRAWN AEROBIC AND ANAEROBIC  Blood Culture adequate volume   Culture   Final    NO GROWTH 5 DAYS Performed at [facility], [address] [place], [place] [address]    Report Status 12/01/2019 FINAL  Final  CULTURE, BLOOD (ROUTINE X 2) w Reflex to ID Panel     Status: None   Collection Time: 11/26/19 10:51 AM   Specimen: BLOOD  Result Value Ref Range Status   Specimen Description BLOOD BLOOD LEFT FOREARM  Final   Special Requests   Final    BOTTLES DRAWN AEROBIC AND ANAEROBIC Blood Culture adequate volume   Culture   Final    NO GROWTH 5 DAYS Performed at [facility], [address] [place], [place] [address]    Report Status 12/01/2019 FINAL  Final  Urine Culture     Status: None   Collection Time: 11/27/19  3:53 PM   Specimen: Urine, Random  Result Value Ref Range Status   Specimen Description   Final    URINE, RANDOM Performed at [facility], [address], [place], [place] [address]    Special Requests   Final    NONE Performed at [facility], [address], [place], [place] [address]    Culture   Final    NO GROWTH Performed at [facility], [place] [address] [place], [place] [address]    Report Status 11/28/2019 FINAL  Final  MRSA PCR Screening     Status: None   Collection Time: 11/27/19  3:55 PM   Specimen: Nasopharyngeal  Result Value Ref Range Status   MRSA by PCR NEGATIVE NEGATIVE Final    Comment:          The GeneXpert MRSA Assay (FDA approved for NASAL specimens only), is one component of a comprehensive MRSA colonization surveillance program. It is not intended to diagnose MRSA infection nor to guide or monitor treatment for MRSA infections. Performed at [facility], [address], [place], [place] [address]          Radiology Studies: No results found.      Scheduled Meds: .  stroke: mapping our early stages of recovery book   Does not apply Once  . enoxaparin (LOVENOX) injection  40 mg Subcutaneous Q24H  . feeding supplement (PRO-STAT SUGAR FREE 64)  30 mL Per Tube Daily  . free water  30 mL Per Tube Q4H  . insulin aspart  0-20 Units Subcutaneous Q4H  . insulin glargine  20 Units Subcutaneous QHS  . ipratropium-albuterol  3 mL Nebulization Once  . ipratropium-albuterol  3 mL Nebulization TID  . mouth rinse  15 mL Mouth Rinse BID  . pantoprazole sodium  40 mg Per Tube Daily   Continuous Infusions: . sodium chloride Stopped (11/27/19 0820)  . feeding supplement (JEVITY 1.2 CAL) 1,000 mL (12/02/19 0600)     LOS: 6 days    Time spent: 30 mins    Wyvonnia Dusky, MD [facility] Pager [phone]  If 7PM-7AM, please contact night-coverage [email or web address] Password [facility] 12/02/2019, 12:55 PM

## 2019-12-02 NOTE — Plan of Care (Signed)
  Problem: Education: Goal: Knowledge of General Education information will improve Description: Including pain rating scale, medication(s)/side effects and non-pharmacologic comfort measures Outcome: Progressing   Problem: Health Behavior/Discharge Planning: Goal: Ability to manage health-related needs will improve Outcome: Progressing   Problem: Clinical Measurements: Goal: Ability to maintain clinical measurements within normal limits will improve Outcome: Progressing Goal: Will remain free from infection Outcome: Progressing Goal: Diagnostic test results will improve Outcome: Progressing Goal: Respiratory complications will improve Outcome: Progressing Goal: Cardiovascular complication will be avoided Outcome: Progressing   Problem: Activity: Goal: Risk for activity intolerance will decrease Outcome: Progressing   Problem: Nutrition: Goal: Adequate nutrition will be maintained Outcome: Progressing   Problem: Coping: Goal: Level of anxiety will decrease Outcome: Progressing   Problem: Elimination: Goal: Will not experience complications related to bowel motility Outcome: Progressing Goal: Will not experience complications related to urinary retention Outcome: Progressing   Problem: Pain Managment: Goal: General experience of comfort will improve Outcome: Progressing   Problem: Safety: Goal: Ability to remain free from injury will improve Outcome: Progressing   Problem: Skin Integrity: Goal: Risk for impaired skin integrity will decrease Outcome: Progressing   Problem: Education: Goal: Knowledge of secondary prevention will improve Outcome: Progressing Goal: Knowledge of patient specific risk factors addressed and post discharge goals established will improve Outcome: Progressing   Problem: Ischemic Stroke/TIA Tissue Perfusion: Goal: Complications of ischemic stroke/TIA will be minimized Outcome: Progressing

## 2019-12-03 LAB — BASIC METABOLIC PANEL
Anion gap: 11 (ref 5–15)
BUN: 45 mg/dL — ABNORMAL HIGH (ref 8–23)
CO2: 28 mmol/L (ref 22–32)
Calcium: 8.7 mg/dL — ABNORMAL LOW (ref 8.9–10.3)
Chloride: 109 mmol/L (ref 98–111)
Creatinine, Ser: 2.11 mg/dL — ABNORMAL HIGH (ref 0.61–1.24)
GFR calc Af Amer: 33 mL/min — ABNORMAL LOW (ref >60)
GFR calc non Af Amer: 28 mL/min — ABNORMAL LOW (ref >60)
Glucose, Bld: 404 mg/dL — ABNORMAL HIGH (ref 70–99)
Potassium: 4.3 mmol/L (ref 3.5–5.1)
Sodium: 148 mmol/L — ABNORMAL HIGH (ref 135–145)

## 2019-12-03 LAB — CBC
HCT: 34.8 % — ABNORMAL LOW (ref 39.0–52.0)
Hemoglobin: 11 g/dL — ABNORMAL LOW (ref 13.0–17.0)
MCH: 27.6 pg (ref 26.0–34.0)
MCHC: 31.6 g/dL (ref 30.0–36.0)
MCV: 87.2 fL (ref 80.0–100.0)
Platelets: 226 10*3/uL (ref 150–400)
RBC: 3.99 MIL/uL — ABNORMAL LOW (ref 4.22–5.81)
RDW: 15.2 % (ref 11.5–15.5)
WBC: 9.6 10*3/uL (ref 4.0–10.5)
nRBC: 0 % (ref 0.0–0.2)

## 2019-12-03 LAB — GLUCOSE, CAPILLARY
Glucose-Capillary: 378 mg/dL — ABNORMAL HIGH (ref 70–99)
Glucose-Capillary: 375 mg/dL — ABNORMAL HIGH (ref 70–99)
Glucose-Capillary: 353 mg/dL — ABNORMAL HIGH (ref 70–99)
Glucose-Capillary: 399 mg/dL — ABNORMAL HIGH (ref 70–99)
Glucose-Capillary: 403 mg/dL — ABNORMAL HIGH (ref 70–99)
Glucose-Capillary: 384 mg/dL — ABNORMAL HIGH (ref 70–99)

## 2019-12-03 MED ORDER — SODIUM CHLORIDE 0.9 % IV SOLN: INTRAVENOUS | Status: DC

## 2019-12-03 MED ORDER — BISACODYL 10 MG RE SUPP
10.0000 mg | Freq: Once | RECTAL | Status: DC
  Filled 2019-12-03: qty 1

## 2019-12-03 MED ORDER — FREE WATER
50.0000 mL | Status: DC
  Administered 2019-12-03 (×2): 50 mL

## 2019-12-03 MED ORDER — INSULIN DETEMIR 100 UNIT/ML ~~LOC~~ SOLN
15.0000 [IU] | Freq: Two times a day (BID) | SUBCUTANEOUS | Status: DC
  Administered 2019-12-03 – 2019-12-05 (×5): 15 [IU] via SUBCUTANEOUS
  Filled 2019-12-03 (×7): qty 0.15

## 2019-12-03 MED ORDER — INSULIN GLARGINE 100 UNIT/ML ~~LOC~~ SOLN
23.0000 [IU] | Freq: Every day | SUBCUTANEOUS | Status: DC
  Filled 2019-12-03: qty 0.23

## 2019-12-03 MED ORDER — HEPARIN SODIUM (PORCINE) 5000 UNIT/ML IJ SOLN
5000.0000 [IU] | Freq: Three times a day (TID) | INTRAMUSCULAR | Status: DC
  Administered 2019-12-03 – 2019-12-05 (×6): 5000 [IU] via SUBCUTANEOUS
  Filled 2019-12-03 (×6): qty 1

## 2019-12-03 NOTE — Progress Notes (Addendum)
Speech Language Pathology Treatment: Dysphagia  Patient Details Name: Brett [name] MRN: [id number] DOB: [birth year] Today's Date: 12/03/2019 Time: 1205-1250 SLP Time Calculation (min) (ACUTE ONLY): 45 min  Assessment / Plan / Recommendation Clinical Impression  Pt seen for ongoing assessment of swallowing; toleration of po trials to establish an oral diet. Pt had an NG placed at the end of last week; the oral diet continues. NSG notes have reported pt is tolerating tsps of puree and Honey liquids w/ no overt s/s of aspiration noted(this morning, NSG report). Today, pt presents w/ a calm State after significant agitation/confusion last week; meds ongoing. He was nonverbal but did turn head toward po's presented.  He appeared to adequately tolerate TSP, then straw, trials of Nectar liquids w/ no overt clinical s/s of aspiration noted. However, pt presents w/ oropharyngeal phase dysphagia overall heavily impacted by his declined Cognitive status and decreased oral awareness/attention; impulsive drinking behavior. This presentation can increase risk for aspiration, choking. Pt required full assistance w/ sitting upright and min-mod verbal/tactile/visual cues w/ TSP/Straw trials presented. During the oral phase, he exhibited grossly adequate oral phase time for bolus management; lingual smacking during A-P transit noted and min time to fully clear few puree trials. Food and liquid trials were alternated to aid oral clearing. Timely swallowing and clearing noted of Nectar liquids. During the pharyngeal phase, pt exhibited fairly adequate toleration of TSP, then Straw, trials of Nectar liquids; no overt clinical s/s of aspiration noted; no decline in respiratory effort, or coughing noted during/post trials.  Recommend initiation of a dysphagia level 1 (PUREE) diet w/ NECTAR consistency liquids once NG tube has been removed; aspiration precautions; feeding support and Supervision at all meals. Pills Crushed  in puree as able. NSG/MDs updated. Recommend f/u by Dietician to monitor intake/needs overall. MD to monitor as well; a PEG placement would be the next recommendation if pt is unable to meet nutritional needs fully in light of pt's Cognitive presentation, baseline.      HPI HPI: Pt is a 85 y.o. male with past medical history including multiple medical issues of CAD, CHF, diabetes, COPD, emphysema, B-cell lymphoma, with frequent admissions for pneumonia, here with altered mental status.  History is limited secondary to aphasia.  Per report, the patient was found down in his house, confused, making nonsensical speech by a neighbor, who heard him banging on the wall.  I called and discussed with his friend, who is his point of contact, and states the last time that he was seen normal was before 9 PM yesterday.  He does not know when, but states that he called 9 the patient did seem somewhat confused with mild slurring, though he was otherwise making sense.  He does not recall if the patient has been sick and states that patient did not voice any complaints about feeling unwell.  Remainder of history is limited secondary to confusion.  MD is unsure of pt's baseline Cognitive functioning.  CXR: "Bilateral interstitial prominence may reflect interstitial edema", "A small left pleural effusion is questioned".  MRI - Negative.       SLP Plan  Continue with current plan of care       Recommendations  Diet recommendations: Dysphagia 1 (puree);Nectar-thick liquid Liquids provided via: Teaspoon;Straw Medication Administration: Crushed with puree(for safer swallowing) Supervision: Staff to assist with self feeding;Full supervision/cueing for compensatory strategies Compensations: Minimize environmental distractions;Slow rate;Small sips/bites;Lingual sweep for clearance of pocketing;Multiple dry swallows after each bite/sip;Follow solids with liquid Postural Changes and/or Swallow  Maneuvers: Seated upright 90  degrees;Upright 30-60 min after meal(Reflux precautions)                General recommendations: (Dietician f/u) Oral Care Recommendations: Oral care BID;Oral care before and after PO;Staff/trained caregiver to provide oral care Follow up Recommendations: Skilled Nursing facility SLP Visit Diagnosis: Dysphagia, oropharyngeal phase (R13.12) Plan: Continue with current plan of care       [place], [place], CCC-SLP Elenna Spratling 12/03/2019, 2:07 PM

## 2019-12-03 NOTE — Progress Notes (Addendum)
Inpatient Diabetes Program Recommendations  AACE/ADA: New Consensus Statement on Inpatient Glycemic Control (2015)  Target Ranges:  Prepandial:   less than 140 mg/dL      Peak postprandial:   less than 180 mg/dL (1-2 hours)      Critically ill patients:  140 - 180 mg/dL   Lab Results  Component Value Date   GLUCAP 353 (H) 12/03/2019   HGBA1C 12.4 (H) 11/27/2019    Review of Glycemic Control Results for Brett [name], Brett [name] (MRN [id number]) as of 12/03/2019 11:07  Ref. Range 12/02/2019 18:13 12/02/2019 22:05 12/03/2019 02:00 12/03/2019 03:09 12/03/2019 08:16  Glucose-Capillary Latest Ref Range: 70 - 99 mg/dL 286 (H) 312 (H) 378 (H) 375 (H) 353 (H)   Diabetes history: DM 2 Outpatient Diabetes medications: Amaryl 4 mg daily Current orders for Inpatient glycemic control:  Jevity 70 cc/hr Novolog resistant q 4 hours Lantus 20 units daily  Inpatient Diabetes Program Recommendations:    Blood sugars remain >goal. May consider d/c of Lantus and add Levemir 15 units bid.  Note that if feeds held, patient will have increased risk for hypoglycemia.   Thanks  Adah Perl, RN, BC-ADM Inpatient Diabetes Coordinator Pager [phone] (8a-5p)

## 2019-12-03 NOTE — Progress Notes (Signed)
PROGRESS NOTE    Brett [name]  TKP:[id number] DOB: [birth year] DOA: 11/25/2019 PCP: Perrin Maltese, MD      Assessment & Plan:   Active Problems:   B-cell lymphoma (HCC)   COPD (chronic obstructive pulmonary disease) (HCC)   Sepsis (HCC)   HTN (hypertension)   Diabetes (HCC)   CAD (coronary artery disease)   Chronic diastolic heart failure (HCC)   Stroke (HCC)   COPD exacerbation (HCC)   Type 2 diabetes mellitus with hyperosmolar nonketotic hyperglycemia (HCC)   AF (paroxysmal atrial fibrillation) (HCC)   Acute encephalopathy   High serum osmolar gap   Dysphagia following unspecified cerebrovascular disease   Hypernatremia   Possibly acute metabolic encephalopathy:  pt is still confused. Not at pt's baseline as per pt's MPOA, pt is normally alert & oriented & able to ambulate independently. MPOA is pt's friend [name]. CT brain & MRI brain showed no acute abnormalities. Repeat CT scan shows no acute abnormalities. PT/OT ordered but pt unable to participate at this time. EEG shows slowing but no epileptiform activity. Neuro following and recs apprec. Speech following and recs apprec   Dysphagia: NPO, may be able to start po diet today, speech will evaluate . Speech & nutrition following and recs apprec   Hypernatremia: free water deficit 0.9L. Increase free water flushes to 79mL q4hr  Sepsis: etiology unclear. Blood cxs NGTD & urine cx shows no growth. Tylenol prn for fevers. Completed abx course. Resolved  Possible pneumonia: possible atypical or gram positive. Strep, mycoplasma, legionella are neg. Completed abx course. Continue on supplemental oxygen and wean as tolerated. Nebs prn   Acute hypoxic respiratory failure: continues to improve. Etiology unclear, possible CHF exacerbation and/or pneumonia. Will hold off on lasix secondary to AKI. Completed abx course. Continue on supplemental oxygen and wean as tolerated.  Acute on chronic diastolic heart failure:  will hold off on lasix today secondary to AKI. Monitor I/Os.   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 Will start IVFs back today as Cr is trending up today. Avoid nephrotoxic meds.  Will continue to monitor    B-cell lymphoma: chronic & currently in remission  COPD: continue nebs, incentive spirometry. VBG obtained no evidence of CO2 retention to explain mental status changes.  HTN: IV hydralazine prn   CAD: chronic & stable. When able to tolerate p.o. resume home medications  DM2: poorly controlled. Increased lantus to 20 units and SSI w/ accuchecks   Possible PAF: IV metoprolol prn for HR >130. Patient apparently not anticoagulation he has amiodarone with his list of medication & his MPOA was unaware the patient has atrial fibrillation.   Elevated lactic acid: resolved. Etiology unclear.   Abdominal distention: etiology unclear. KUB & CT abd/pelvis shows no acute abnormailities   Transaminitis: etiology unclear. Resolved  Normocytic anemia: etiology unclear. No need for a transfusion at this time. Will continue to monitor   Right eye blindness: chronic as per pt's MPOA   DVT prophylaxis: lovenox Code Status: DNR Family Communication: Pt is estranged from his family who evidently lives in [place] and [name].  Disposition Plan:   Consultants:  Neuro   Procedures: n/a   Antimicrobials: completed abx course   Subjective: Pt is confused still   Objective: Vitals:   12/02/19 1437 12/02/19 1613 12/02/19 2027 12/03/19 0018  BP:  140/75  (!) 153/69  Pulse:  (!) 111  (!) 116  Resp:  18  16  Temp:  98.4 F (36.9 C)  98.1 F (36.7 C)  TempSrc:  Oral  Oral  SpO2: 98% 100% 99% 95%  Weight:      Height:        Intake/Output Summary (Last 24 hours) at 12/03/2019 0713 Last data filed at 12/02/2019 1300 Gross per 24 hour  Intake 0 ml  Output --  Net 0 ml   Filed Weights   11/27/19 0839 11/30/19 0500 12/02/19 0500  Weight: 93.2 kg 89.6 kg 89.8 kg     Examination:  General exam: Appears restless  Respiratory system:  Decreased breath sounds b/l. No rales Cardiovascular system: S1 & S2 +. No rubs, gallops or clicks. B/l LE  edema Gastrointestinal system: Abdomen is obese, soft and nontender.  Hypoactive bowel sounds heard. Central nervous system: Awake but still confused. Moves all 4 extremities  Psychiatry: Judgement and insight appear abnormal. Flat mood and affect      Data Reviewed: I have personally reviewed following labs and imaging studies  CBC: Recent Labs  Lab 11/29/19 0315 11/30/19 0627 12/01/19 0332 12/02/19 0759 12/03/19 0347  WBC 7.0 7.7 8.8 11.3* 9.6  HGB 10.8* 10.9* 11.3* 11.0* 11.0*  HCT 35.1* 35.8* 37.6* 34.4* 34.8*  MCV 87.8 88.8 89.7 85.8 87.2  PLT 186 207 236 239 846   Basic Metabolic Panel: Recent Labs  Lab 11/28/19 0617 11/29/19 0315 11/30/19 0627 11/30/19 2016 12/01/19 0332 12/02/19 0759 12/03/19 0347  NA 140 143 144 140 143 147* 148*  K 4.3 3.7 3.8 3.5 3.5 3.7 4.3  CL 102 104 106 104 106 109 109  CO2 24 27 25 28 26 26 28   GLUCOSE 189* 153* 217* 547* 453* 398* 404*  BUN 22 33* 39* 40* 39* 41* 45*  CREATININE 1.83* 1.98* 1.70* 2.00* 2.01* 1.98* 2.11*  CALCIUM 8.0* 7.9* 8.2* 8.1* 8.3* 8.5* 8.7*  MG 2.0 2.4 2.7*  --  2.6* 2.6*  --   PHOS 4.0 4.1 3.8  --  3.3 3.5  --    GFR: Estimated Creatinine Clearance: 29.4 mL/min (A) (by C-G formula based on SCr of 2.11 mg/dL (H)). Liver Function Tests: Recent Labs  Lab 11/26/19 1728 11/27/19 0157 11/28/19 0617  AST 48* 46* 38  ALT 61* 54* 44  ALKPHOS 102 98 86  BILITOT 0.8 0.7 0.8  PROT 6.2* 6.2* 5.8*  ALBUMIN 3.2* 3.2* 2.8*   Recent Labs  Lab 12/01/19 0332  LIPASE 44   No results for input(s): AMMONIA in the last 168 hours. Coagulation Profile: No results for input(s): INR, PROTIME in the last 168 hours. Cardiac Enzymes: No results for input(s): CKTOTAL, CKMB, CKMBINDEX, TROPONINI in the last 168 hours. BNP (last 3  results) No results for input(s): PROBNP in the last 8760 hours. HbA1C: No results for input(s): HGBA1C in the last 72 hours. CBG: Recent Labs  Lab 12/02/19 1505 12/02/19 1813 12/02/19 2205 12/03/19 0200 12/03/19 0309  GLUCAP 365* 286* 312* 378* 375*   Lipid Profile: No results for input(s): CHOL, HDL, LDLCALC, TRIG, CHOLHDL, LDLDIRECT in the last 72 hours. Thyroid Function Tests: No results for input(s): TSH, T4TOTAL, FREET4, T3FREE, THYROIDAB in the last 72 hours. Anemia Panel: No results for input(s): VITAMINB12, FOLATE, FERRITIN, TIBC, IRON, RETICCTPCT in the last 72 hours. Sepsis Labs: No results for input(s): PROCALCITON, LATICACIDVEN in the last 168 hours.  Recent Results (from the past 240 hour(s))  Urine Culture     Status: Abnormal   Collection Time: 11/25/19  2:37 PM   Specimen: Urine, Random  Result Value Ref Range  Status   Specimen Description   Final    URINE, RANDOM Performed at [facility], [address], [place], [place] [address]    Special Requests   Final    NONE Performed at [facility], [address] [place], [place] [address]    Culture (A)  Final    <10,000 COLONIES/mL INSIGNIFICANT GROWTH Performed at [facility] [address], [place], [place] [address]    Report Status 11/27/2019 FINAL  Final  Respiratory Panel by RT PCR (Flu A&B, Covid) - Urine, Clean Catch     Status: None   Collection Time: 11/25/19  4:22 PM   Specimen: Urine, Clean Catch  Result Value Ref Range Status   SARS Coronavirus 2 by RT PCR NEGATIVE NEGATIVE Final    Comment: (NOTE) SARS-CoV-2 target nucleic acids are NOT DETECTED. The SARS-CoV-2 RNA is generally detectable in upper respiratoy specimens during the acute phase of infection. The lowest concentration of SARS-CoV-2 viral copies this assay can detect is 131 copies/mL. A negative result does not preclude SARS-Cov-2 infection and should not be used as the sole basis for treatment  or other patient management decisions. A negative result may occur with  improper specimen collection/handling, submission of specimen other than nasopharyngeal swab, presence of viral mutation(s) within the areas targeted by this assay, and inadequate number of viral copies (<131 copies/mL). A negative result must be combined with clinical observations, patient history, and epidemiological information. The expected result is Negative. Fact Sheet for Patients:  PinkCheek.be Fact Sheet for Healthcare Providers:  GravelBags.it This test is not yet ap proved or cleared by the [place] FDA and  has been authorized for detection and/or diagnosis of SARS-CoV-2 by FDA under an Emergency Use Authorization (EUA). This EUA will remain  in effect (meaning this test can be used) for the duration of the COVID-19 declaration under Section 564(b)(1) of the Act, 21 U.S.C. section 360bbb-3(b)(1), unless the authorization is terminated or revoked sooner.    Influenza A by PCR NEGATIVE NEGATIVE Final   Influenza B by PCR NEGATIVE NEGATIVE Final    Comment: (NOTE) The Xpert Xpress SARS-CoV-2/FLU/RSV assay is intended as an aid in  the diagnosis of influenza from Nasopharyngeal swab specimens and  should not be used as a sole basis for treatment. Nasal washings and  aspirates are unacceptable for Xpert Xpress SARS-CoV-2/FLU/RSV  testing. Fact Sheet for Patients: PinkCheek.be Fact Sheet for Healthcare Providers: GravelBags.it This test is not yet approved or cleared by the [place] FDA and  has been authorized for detection and/or diagnosis of SARS-CoV-2 by  FDA under an Emergency Use Authorization (EUA). This EUA will remain  in effect (meaning this test can be used) for the duration of the  Covid-19 declaration under Section 564(b)(1) of the Act, 21  U.S.C. section  360bbb-3(b)(1), unless the authorization is  terminated or revoked. Performed at [facility], [address] [place], [place] [address]   CULTURE, BLOOD (ROUTINE X 2) w Reflex to ID Panel     Status: None   Collection Time: 11/26/19 10:51 AM   Specimen: BLOOD  Result Value Ref Range Status   Specimen Description BLOOD RIGHT ANTECUBITAL  Final   Special Requests   Final    BOTTLES DRAWN AEROBIC AND ANAEROBIC Blood Culture adequate volume   Culture   Final    NO GROWTH 5 DAYS Performed at [facility], [address], [place], [place] [address]    Report Status  12/01/2019 FINAL  Final  CULTURE, BLOOD (ROUTINE X 2) w Reflex to ID Panel     Status: None   Collection Time: 11/26/19 10:51 AM   Specimen: BLOOD  Result Value Ref Range Status   Specimen Description BLOOD BLOOD LEFT FOREARM  Final   Special Requests   Final    BOTTLES DRAWN AEROBIC AND ANAEROBIC Blood Culture adequate volume   Culture   Final    NO GROWTH 5 DAYS Performed at [facility], [address], [place], [place] [address]    Report Status 12/01/2019 FINAL  Final  Urine Culture     Status: None   Collection Time: 11/27/19  3:53 PM   Specimen: Urine, Random  Result Value Ref Range Status   Specimen Description   Final    URINE, RANDOM Performed at [facility], [address] [place], [place] [address]    Special Requests   Final    NONE Performed at [facility], [address], [place], [place] [address]    Culture   Final    NO GROWTH Performed at [facility], [place] [address] [place], [place] [address]    Report Status 11/28/2019 FINAL  Final  MRSA PCR Screening     Status: None   Collection Time: 11/27/19  3:55 PM   Specimen: Nasopharyngeal  Result Value Ref Range Status   MRSA by PCR NEGATIVE NEGATIVE Final    Comment:        The GeneXpert MRSA Assay (FDA approved for NASAL specimens only), is one component of a comprehensive MRSA  colonization surveillance program. It is not intended to diagnose MRSA infection nor to guide or monitor treatment for MRSA infections. Performed at [facility], [address] [place], [place] [address]          Radiology Studies: No results found.      Scheduled Meds: .  stroke: mapping our early stages of recovery book   Does not apply Once  . feeding supplement (PRO-STAT SUGAR FREE 64)  30 mL Per Tube Daily  . free water  50 mL Per Tube Q4H  . heparin injection (subcutaneous)  5,000 Units Subcutaneous Q8H  . insulin aspart  0-20 Units Subcutaneous Q4H  . insulin glargine  20 Units Subcutaneous QHS  . ipratropium-albuterol  3 mL Nebulization Once  . ipratropium-albuterol  3 mL Nebulization TID  . mouth rinse  15 mL Mouth Rinse BID  . pantoprazole sodium  40 mg Per Tube Daily   Continuous Infusions: . sodium chloride Stopped (11/27/19 0820)  . feeding supplement (JEVITY 1.2 CAL) 1,000 mL (12/02/19 0600)     LOS: 7 days    Time spent: 31 mins    Wyvonnia Dusky, MD [facility] Pager [phone]  If 7PM-7AM, please contact night-coverage [email or web address] Password [facility] 12/03/2019, 7:13 AM

## 2019-12-03 NOTE — Care Management Important Message (Signed)
Important Message  Patient Details  Name: Brett [name] MRN: [id number] Date of Birth: [birth year]   Medicare Important Message Given:  Yes     Juliann Pulse A Sameera Betton 12/03/2019, 11:14 AM

## 2019-12-04 LAB — CBC
HCT: 35.4 % — ABNORMAL LOW (ref 39.0–52.0)
Hemoglobin: 10.5 g/dL — ABNORMAL LOW (ref 13.0–17.0)
MCH: 27.3 pg (ref 26.0–34.0)
MCHC: 29.7 g/dL — ABNORMAL LOW (ref 30.0–36.0)
MCV: 91.9 fL (ref 80.0–100.0)
Platelets: 238 10*3/uL (ref 150–400)
RBC: 3.85 MIL/uL — ABNORMAL LOW (ref 4.22–5.81)
RDW: 15.1 % (ref 11.5–15.5)
WBC: 8 10*3/uL (ref 4.0–10.5)
nRBC: 0 % (ref 0.0–0.2)

## 2019-12-04 LAB — BASIC METABOLIC PANEL
Anion gap: 9 (ref 5–15)
BUN: 46 mg/dL — ABNORMAL HIGH (ref 8–23)
CO2: 27 mmol/L (ref 22–32)
Calcium: 8.7 mg/dL — ABNORMAL LOW (ref 8.9–10.3)
Chloride: 113 mmol/L — ABNORMAL HIGH (ref 98–111)
Creatinine, Ser: 1.78 mg/dL — ABNORMAL HIGH (ref 0.61–1.24)
GFR calc Af Amer: 40 mL/min — ABNORMAL LOW (ref >60)
GFR calc non Af Amer: 35 mL/min — ABNORMAL LOW (ref >60)
Glucose, Bld: 138 mg/dL — ABNORMAL HIGH (ref 70–99)
Potassium: 4 mmol/L (ref 3.5–5.1)
Sodium: 149 mmol/L — ABNORMAL HIGH (ref 135–145)

## 2019-12-04 LAB — GLUCOSE, CAPILLARY
Glucose-Capillary: 234 mg/dL — ABNORMAL HIGH (ref 70–99)
Glucose-Capillary: 145 mg/dL — ABNORMAL HIGH (ref 70–99)
Glucose-Capillary: 101 mg/dL — ABNORMAL HIGH (ref 70–99)
Glucose-Capillary: 298 mg/dL — ABNORMAL HIGH (ref 70–99)
Glucose-Capillary: 331 mg/dL — ABNORMAL HIGH (ref 70–99)
Glucose-Capillary: 291 mg/dL — ABNORMAL HIGH (ref 70–99)
Glucose-Capillary: 308 mg/dL — ABNORMAL HIGH (ref 70–99)

## 2019-12-04 MED ORDER — ROSUVASTATIN CALCIUM 10 MG PO TABS
40.0000 mg | ORAL_TABLET | Freq: Every day | ORAL | Status: DC
  Administered 2019-12-04: 18:00:00 40 mg via ORAL
  Filled 2019-12-04: qty 4

## 2019-12-04 MED ORDER — FERROUS SULFATE 325 (65 FE) MG PO TABS
325.0000 mg | ORAL_TABLET | Freq: Two times a day (BID) | ORAL | Status: DC
  Administered 2019-12-04 – 2019-12-05 (×4): 325 mg via ORAL
  Filled 2019-12-04 (×4): qty 1

## 2019-12-04 MED ORDER — DEXTROSE 5 % IV SOLN: INTRAVENOUS | Status: DC

## 2019-12-04 MED ORDER — AMIODARONE HCL 200 MG PO TABS
200.0000 mg | ORAL_TABLET | Freq: Every day | ORAL | Status: DC
  Administered 2019-12-04 – 2019-12-05 (×2): 200 mg via ORAL
  Filled 2019-12-04 (×2): qty 1

## 2019-12-04 MED ORDER — AMLODIPINE BESYLATE 5 MG PO TABS
5.0000 mg | ORAL_TABLET | Freq: Every day | ORAL | Status: DC
  Administered 2019-12-04 – 2019-12-05 (×2): 5 mg via ORAL
  Filled 2019-12-04 (×2): qty 1

## 2019-12-04 MED ORDER — DOCUSATE SODIUM 100 MG PO CAPS
100.0000 mg | ORAL_CAPSULE | Freq: Every day | ORAL | Status: DC
  Administered 2019-12-04 – 2019-12-05 (×2): 100 mg via ORAL
  Filled 2019-12-04 (×2): qty 1

## 2019-12-04 MED ORDER — CLOPIDOGREL BISULFATE 75 MG PO TABS
75.0000 mg | ORAL_TABLET | Freq: Every day | ORAL | Status: DC
  Administered 2019-12-04 – 2019-12-05 (×2): 75 mg via ORAL
  Filled 2019-12-04 (×2): qty 1

## 2019-12-04 NOTE — TOC Progression Note (Signed)
Transition of Care [facility]) - Progression Note    Patient Details  Name: DUAINE RADIN MRN: [id number] Date of Birth: 02-09-1935  [facility] CM/SW Contact  Shelbie Ammons, RN Phone Number: 12/04/2019, 12:21 PM  Clinical Narrative:     RNCM spoke with patient's nurse, patient has had NG tube removed since yesterday and is more alert. Went to patient's bedside however he is sleeping. Placed call to patient's HCPOA who reports he is agreeable to patient discharging to [facility]. Placed call to [facility] and she is agreeable to accept patient but they will need to know if he is going to be willing to work with PT.  Secure message sent to MD to assess if PT eval can be re-ordered.          Expected Discharge Plan and Services                                                 Social Determinants of Health (SDOH) Interventions    Readmission Risk Interventions No flowsheet data found.

## 2019-12-04 NOTE — Evaluation (Signed)
Occupational Therapy Evaluation Patient Details Name: Brett [name] MRN: [id number] DOB: [birth year] Today's Date: 12/04/2019    History of Present Illness Patient present with AMS, found down at home. PMH includes CAD, DM, CKD, HTN, CHF   Clinical Impression   Brett [name] was seen for OT evaluation this date. Pt received seated upright in room recliner. Pt noted to be very HOH and has significant difficulty answering this therapists questions this date. Information regarding home set up and PLOF obtained from chart. Per chart, pt was generally independent with ADL mgt, living in a 1 level home with 3 steps to ener and a L hand rail. Pt is able to state his name, place, and limited situation, however he has difficulty following 1-step VCs consistently t/o assessment. Pt functionally limited by decreased strength, poor activity tolerance, limited cognition/hearing, decreased safety awareness, and decreased awareness of deficits. He requires supervision for seated UB ADL mgt, as well as min assist for functional mobility, and moderate assistance for heavy ADL management such as bathing and LB dressing.  Pt would benefit from skilled OT to address noted impairments and functional limitations (see below for any additional details) in order to maximize safety and independence while minimizing falls risk and caregiver burden.  Upon hospital discharge, recommend STR to maximize pt safety and return to PLOF.     Follow Up Recommendations  SNF    Equipment Recommendations  Other (comment)(Defer to next venue of care.)    Recommendations for Other Services       Precautions / Restrictions Precautions Precautions: Fall Restrictions Weight Bearing Restrictions: No      Mobility Bed Mobility Overal bed mobility: Needs Assistance Bed Mobility: Supine to Sit     Supine to sit: Min assist     General bed mobility comments: Deferred. Pt up in room recliner at start/end of  session.  Transfers Overall transfer level: Needs assistance Equipment used: Rolling walker (2 wheeled) Transfers: Sit to/from Stand Sit to Stand: Min assist         General transfer comment: Consistent cueing for hand/foot placement/safety during functional transfers.    Balance Overall balance assessment: Needs assistance Sitting-balance support: Feet supported Sitting balance-Leahy Scale: Good Sitting balance - Comments: Steady sitting, reaching within BOS.   Standing balance support: Bilateral upper extremity supported;During functional activity Standing balance-Leahy Scale: Fair Standing balance comment: reliant on rw at this time                           ADL either performed or assessed with clinical judgement   ADL Overall ADL's : Needs assistance/impaired                                       General ADL Comments: Set up/sup for self-feeding and light grooming tasks with consistent multimodal cueing for sequencing/safety. Min A for more complex ADL tasks such as LB bathing and dressing.     Vision   Additional Comments: Pt unable to state.     Perception     Praxis      Pertinent Vitals/Pain Pain Assessment: No/denies pain Faces Pain Scale: No hurt     Hand Dominance Right   Extremity/Trunk Assessment Upper Extremity Assessment Upper Extremity Assessment: Generalized weakness   Lower Extremity Assessment Lower Extremity Assessment: Generalized weakness   Cervical / Trunk Assessment Cervical / Trunk Assessment: Normal  Communication Communication Communication: HOH   Cognition Arousal/Alertness: Awake/alert Behavior During Therapy: WFL for tasks assessed/performed Overall Cognitive Status: Difficult to assess                                 General Comments: Pt oriented to self, place, and limited situation. Has difficulty following 1-step VCs despite multimodal prompting. Uncertain of this is due to  cognition or significant hearing impairment. Will continue to assess.   General Comments  Pt requesting "A cup of ice cold water" Per room board, is on nectar thick liquids. This author provides nectar thick water container with no ice. RN informed.    Exercises Other Exercises Other Exercises: Pt assisted with set-up of lunch tray. Requires cueing to initiate self-feeding but generally does well with bimanual hand use, use of utensil, and cup drinking this date. No s/s of aspiration noted during self-feeding.   Shoulder Instructions      Home Living Family/patient expects to be discharged to:: Private residence Living Arrangements: Alone Available Help at Discharge: Friend(s);Neighbor Type of Home: House Home Access: Stairs to enter [employer] of Steps: 3 STE Entrance Stairs-Rails: Left Home Layout: One level     Bathroom Shower/Tub: Teacher, early years/pre: Handicapped height     Home Equipment: Environmental consultant - 2 wheels;Cane - single point;Toilet riser   Additional Comments: very HOH, most of home enviornment obtained from previous evaluations      Prior Functioning/Environment Level of Independence: Independent        Comments: I w/o AD for basic transfers and ADL mgt. 2L O2 at home at baseline.        OT Problem List: Decreased strength;Decreased coordination;Decreased range of motion;Decreased cognition;Decreased activity tolerance;Decreased safety awareness;Impaired balance (sitting and/or standing);Decreased knowledge of use of DME or AE      OT Treatment/Interventions: Self-care/ADL training;Therapeutic exercise;Therapeutic activities;DME and/or AE instruction;Patient/family education;Balance training    OT Goals(Current goals can be found in the care plan section) Acute Rehab OT Goals Patient Stated Goal: i hope i can go home tomorrow OT Goal Formulation: With patient Time For Goal Achievement: 12/18/19 Potential to Achieve Goals: Good ADL  Goals Pt Will Perform Grooming: sitting;with set-up;with supervision(With cueing for safety and sequencing PRN) Pt Will Perform Upper Body Dressing: sitting;with set-up;with supervision(With cueing for safety and sequencing PRN) Pt Will Perform Lower Body Dressing: with set-up;with supervision;sit to/from stand;with adaptive equipment(With cueing for safety and sequencing PRN)  OT Frequency: Min 1X/week   Barriers to D/C:            Co-evaluation              AM-PAC OT "6 Clicks" Daily Activity     Outcome Measure Help from another person eating meals?: A Little Help from another person taking care of personal grooming?: A Little Help from another person toileting, which includes using toliet, bedpan, or urinal?: A Little Help from another person bathing (including washing, rinsing, drying)?: A Lot Help from another person to put on and taking off regular upper body clothing?: A Little Help from another person to put on and taking off regular lower body clothing?: A Little 6 Click Score: 17   End of Session Equipment Utilized During Treatment: Gait belt;Rolling walker Nurse Communication: Other (comment)  Activity Tolerance: Patient tolerated treatment well Patient left: in chair;with call bell/phone within reach;with chair alarm set  OT Visit Diagnosis: Muscle weakness (generalized) (M62.81);Other abnormalities  of gait and mobility (R26.89)                Time: 1430-1450 OT Time Calculation (min): 20 min Charges:  OT General Charges $OT Visit: 1 Visit OT Evaluation $OT Eval Moderate Complexity: 1 Mod OT Treatments $Self Care/Home Management : 8-22 mins  Shara Blazing, M.S., OTR/L Ascom: [phone] 12/04/19, 4:34 PM

## 2019-12-04 NOTE — Progress Notes (Signed)
PROGRESS NOTE    Brett [name]  RJJ:[id number] DOB: 18-Jun-1935 DOA: 11/25/2019 PCP: Perrin Maltese, MD    HPI taken by Dr. Roel Cluck: Brett [name] is a 85 y.o. male with medical history significant of CAD, CHF, diabetes, COPD with frequent admissions for pneumonia, Anemia, B-cell lymphoma status post chemotherapy, CAD, carotid stenosis, DM2, HTN, HLD, peptic ulcer disease, peripheral vascular disease fibrillation  may have had mini Stroke  Presented with   AMS and aphasia, found down in his house confused Last seen normal was 9 PM  He have trouble finishing his sentences He was rambling last night They talk every day on the phone He has been isolated His friend has been helping have been healthy Hx of Lymphoma in remission  He does not drink quit smoking 5 years ago No dementia But lately has been very confused   He cannot see out of right eye  Infectious risk factors:  Per friend Reports confusion    In  ER  [facility] Course from Dr. Lenise Herald 11/27/19-12/04/19: Pt's mental status has waxed and wane without clear source of etiology. CT brain x 2, MRI brain show no acute intracranial abnormalities. EEG shows slowing but no epileptiform activity. Neuro has been following the pt as well. Of note, pt has not been able to take anything by mouth and pt was getting tube feeds via NG tube. NG tube was removed yesterday and pt was started on a nectar thick diet as per speech. Furthermore, pt was found to have pneumonia and has completed a course of abxs already. Also, pt's MPOA is pt's friend Claudette Head and pt is estranged from his family who live outside of the country.    Assessment & Plan:   Active Problems:   B-cell lymphoma (HCC)   COPD (chronic obstructive pulmonary disease) (HCC)   Sepsis (HCC)   HTN (hypertension)   Diabetes (HCC)   CAD (coronary artery disease)   Chronic diastolic heart failure (HCC)   Stroke (HCC)   COPD  exacerbation (HCC)   Type 2 diabetes mellitus with hyperosmolar nonketotic hyperglycemia (HCC)   AF (paroxysmal atrial fibrillation) (HCC)   Acute encephalopathy   High serum osmolar gap   Dysphagia following unspecified cerebrovascular disease   Hypernatremia   Possibly acute metabolic encephalopathy:  pt is still confused but eating by mouth today. Not at pt's baseline as per pt's MPOA, pt is normally alert & oriented & able to ambulate independently. MPOA is pt's friend [name]. CT brain & MRI brain showed no acute abnormalities. Repeat CT scan shows no acute abnormalities. PT/OT re-consulted. EEG shows slowing but no epileptiform activity. Neuro following and recs apprec. Speech following and recs apprec   Dysphagia: NG tube d/c yesterday and will continue on nectar thick diet as per speech . Speech & nutrition following and recs apprec   Hypernatremia: free water deficit 1.0L. Will start IV D5W   Sepsis: etiology unclear. Blood cxs NGTD & urine cx shows no growth. Tylenol prn for fevers. Completed abx course. Resolved  Possible pneumonia: possible atypical or gram positive. Strep, mycoplasma, legionella are all neg. Completed abx course. Continue on supplemental oxygen and wean as tolerated. Nebs prn   Acute hypoxic respiratory failure: continues to improve. Etiology unclear, possible CHF exacerbation and/or pneumonia. Will hold off on lasix secondary to AKI. Completed abx course. Continue on supplemental oxygen and wean as tolerated.  Acute on chronic diastolic heart failure:  will hold off on lasix today secondary to AKI. Monitor I/Os.   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 Cr is trending down today. Will continue IVFs. Avoid nephrotoxic meds.  Will continue to monitor    B-cell lymphoma: chronic & currently in remission  COPD: continue nebs, incentive spirometry. VBG obtained no evidence of CO2 retention to explain mental status changes.  HTN: IV hydralazine prn     CAD: chronic & stable. Restarted home dose of amlodipine, plavix & statin   HLD: restarted statin   DM2: poorly controlled. Increased lantus to 20 units and SSI w/ accuchecks   Possible PAF: IV metoprolol prn for HR >130. Restarted home dose of amiodarone. Patient apparently not anticoagulation he has amiodarone with his list of medication & his MPOA was unaware the patient has atrial fibrillation.   Elevated lactic acid: resolved. Etiology unclear.   Abdominal distention: etiology unclear. KUB & CT abd/pelvis shows no acute abnormailities   Transaminitis: etiology unclear. Resolved  Normocytic anemia: etiology unclear. No need for a transfusion at this time. Will continue to monitor   Right eye blindness: chronic as per pt's MPOA   DVT prophylaxis: lovenox Code Status: DNR Family Communication: Pt is estranged from his family who evidently lives in [place] and [name]. Talked w/ pt's MPOA, Mr. Wynetta Emery (friend) via telephone and answered all of his question  Disposition Plan:   Consultants:  Neuro   Procedures: n/a   Antimicrobials: completed abx course   Subjective: Pt did follow simple commands today but did not answer any of my questions today.   Objective: Vitals:   12/03/19 1621 12/03/19 1931 12/03/19 2319 12/04/19 0431  BP: (!) 150/91  (!) 143/69   Pulse: (!) 119  (!) 112   Resp: 16  15   Temp: 98.8 F (37.1 C)  98 F (36.7 C)   TempSrc:      SpO2: 98% 96% 96%   Weight:    89.3 kg  Height:        Intake/Output Summary (Last 24 hours) at 12/04/2019 0719 Last data filed at 12/04/2019 0300 Gross per 24 hour  Intake 5290.42 ml  Output --  Net 5290.42 ml   Filed Weights   11/30/19 0500 12/02/19 0500 12/04/19 0431  Weight: 89.6 kg 89.8 kg 89.3 kg    Examination:  General exam: Calm and comfortable Respiratory system:  diminished breath sounds b/l. No rales Cardiovascular system: S1 & S2 +. No rubs, gallops or clicks. B/l LE   edema Gastrointestinal system: Abdomen is obese, soft and nontender.  Hypoactive bowel sounds heard. Central nervous system: Awake but still confused. Moves all 4 extremities  Psychiatry: Judgement and insight appear abnormal. Flat mood and affect      Data Reviewed: I have personally reviewed following labs and imaging studies  CBC: Recent Labs  Lab 11/30/19 0627 12/01/19 0332 12/02/19 0759 12/03/19 0347 12/04/19 0333  WBC 7.7 8.8 11.3* 9.6 8.0  HGB 10.9* 11.3* 11.0* 11.0* 10.5*  HCT 35.8* 37.6* 34.4* 34.8* 35.4*  MCV 88.8 89.7 85.8 87.2 91.9  PLT 207 236 239 226 258   Basic Metabolic Panel: Recent Labs  Lab 11/28/19 0617 11/29/19 0315 11/30/19 0627 11/30/19 2016 12/01/19 0332 12/02/19 0759 12/03/19 0347 12/04/19 0333  NA 140 143 144 140 143 147* 148* 149*  K 4.3 3.7 3.8 3.5 3.5 3.7 4.3 4.0  CL 102 104 106 104 106 109 109 113*  CO2 24 27 25 28 26 26 28 27   GLUCOSE 189*  153* 217* 547* 453* 398* 404* 138*  BUN 22 33* 39* 40* 39* 41* 45* 46*  CREATININE 1.83* 1.98* 1.70* 2.00* 2.01* 1.98* 2.11* 1.78*  CALCIUM 8.0* 7.9* 8.2* 8.1* 8.3* 8.5* 8.7* 8.7*  MG 2.0 2.4 2.7*  --  2.6* 2.6*  --   --   PHOS 4.0 4.1 3.8  --  3.3 3.5  --   --    GFR: Estimated Creatinine Clearance: 34.8 mL/min (A) (by C-G formula based on SCr of 1.78 mg/dL (H)). Liver Function Tests: Recent Labs  Lab 11/28/19 0617  AST 38  ALT 44  ALKPHOS 86  BILITOT 0.8  PROT 5.8*  ALBUMIN 2.8*   Recent Labs  Lab 12/01/19 0332  LIPASE 44   No results for input(s): AMMONIA in the last 168 hours. Coagulation Profile: No results for input(s): INR, PROTIME in the last 168 hours. Cardiac Enzymes: No results for input(s): CKTOTAL, CKMB, CKMBINDEX, TROPONINI in the last 168 hours. BNP (last 3 results) No results for input(s): PROBNP in the last 8760 hours. HbA1C: No results for input(s): HGBA1C in the last 72 hours. CBG: Recent Labs  Lab 12/03/19 0309 12/03/19 0816 12/03/19 1224 12/03/19 1651  12/03/19 1949  GLUCAP 375* 353* 399* 403* 384*   Lipid Profile: No results for input(s): CHOL, HDL, LDLCALC, TRIG, CHOLHDL, LDLDIRECT in the last 72 hours. Thyroid Function Tests: No results for input(s): TSH, T4TOTAL, FREET4, T3FREE, THYROIDAB in the last 72 hours. Anemia Panel: No results for input(s): VITAMINB12, FOLATE, FERRITIN, TIBC, IRON, RETICCTPCT in the last 72 hours. Sepsis Labs: No results for input(s): PROCALCITON, LATICACIDVEN in the last 168 hours.  Recent Results (from the past 240 hour(s))  Urine Culture     Status: Abnormal   Collection Time: 11/25/19  2:37 PM   Specimen: Urine, Random  Result Value Ref Range Status   Specimen Description   Final    URINE, RANDOM Performed at [facility], [address] [place], [place] [address]    Special Requests   Final    NONE Performed at [facility], [address], [place], [place] [address]    Culture (A)  Final    <10,000 COLONIES/mL INSIGNIFICANT GROWTH Performed at [facility] [address], [place], [place] [address]    Report Status 11/27/2019 FINAL  Final  Respiratory Panel by RT PCR (Flu A&B, Covid) - Urine, Clean Catch     Status: None   Collection Time: 11/25/19  4:22 PM   Specimen: Urine, Clean Catch  Result Value Ref Range Status   SARS Coronavirus 2 by RT PCR NEGATIVE NEGATIVE Final    Comment: (NOTE) SARS-CoV-2 target nucleic acids are NOT DETECTED. The SARS-CoV-2 RNA is generally detectable in upper respiratoy specimens during the acute phase of infection. The lowest concentration of SARS-CoV-2 viral copies this assay can detect is 131 copies/mL. A negative result does not preclude SARS-Cov-2 infection and should not be used as the sole basis for treatment or other patient management decisions. A negative result may occur with  improper specimen collection/handling, submission of specimen other than nasopharyngeal swab, presence of viral mutation(s) within the areas  targeted by this assay, and inadequate number of viral copies (<131 copies/mL). A negative result must be combined with clinical observations, patient history, and epidemiological information. The expected result is Negative. Fact Sheet for Patients:  PinkCheek.be Fact Sheet for Healthcare Providers:  GravelBags.it This test is not yet ap proved or cleared by the [place] and  has been authorized for detection and/or diagnosis of SARS-CoV-2 by FDA under an Emergency Use Authorization (EUA). This EUA will remain  in effect (meaning this test can be used) for the duration of the COVID-19 declaration under Section 564(b)(1) of the Act, 21 U.S.C. section 360bbb-3(b)(1), unless the authorization is terminated or revoked sooner.    Influenza A by PCR NEGATIVE NEGATIVE Final   Influenza B by PCR NEGATIVE NEGATIVE Final    Comment: (NOTE) The Xpert Xpress SARS-CoV-2/FLU/RSV assay is intended as an aid in  the diagnosis of influenza from Nasopharyngeal swab specimens and  should not be used as a sole basis for treatment. Nasal washings and  aspirates are unacceptable for Xpert Xpress SARS-CoV-2/FLU/RSV  testing. Fact Sheet for Patients: PinkCheek.be Fact Sheet for Healthcare Providers: GravelBags.it This test is not yet approved or cleared by the [place] FDA and  has been authorized for detection and/or diagnosis of SARS-CoV-2 by  FDA under an Emergency Use Authorization (EUA). This EUA will remain  in effect (meaning this test can be used) for the duration of the  Covid-19 declaration under Section 564(b)(1) of the Act, 21  U.S.C. section 360bbb-3(b)(1), unless the authorization is  terminated or revoked. Performed at [facility], [address] [place], [place] [address]   CULTURE, BLOOD (ROUTINE X 2) w Reflex to ID Panel     Status: None    Collection Time: 11/26/19 10:51 AM   Specimen: BLOOD  Result Value Ref Range Status   Specimen Description BLOOD RIGHT ANTECUBITAL  Final   Special Requests   Final    BOTTLES DRAWN AEROBIC AND ANAEROBIC Blood Culture adequate volume   Culture   Final    NO GROWTH 5 DAYS Performed at [facility], [address] [place], [place] [address]    Report Status 12/01/2019 FINAL  Final  CULTURE, BLOOD (ROUTINE X 2) w Reflex to ID Panel     Status: None   Collection Time: 11/26/19 10:51 AM   Specimen: BLOOD  Result Value Ref Range Status   Specimen Description BLOOD BLOOD LEFT FOREARM  Final   Special Requests   Final    BOTTLES DRAWN AEROBIC AND ANAEROBIC Blood Culture adequate volume   Culture   Final    NO GROWTH 5 DAYS Performed at [facility], [address], [place], [place] [address]    Report Status 12/01/2019 FINAL  Final  Urine Culture     Status: None   Collection Time: 11/27/19  3:53 PM   Specimen: Urine, Random  Result Value Ref Range Status   Specimen Description   Final    URINE, RANDOM Performed at [facility], [address], [place], [place] [address]    Special Requests   Final    NONE Performed at [facility], [address], [place], [place] [address]    Culture   Final    NO GROWTH Performed at [facility], [place] [address], [place],  [address]    Report Status 11/28/2019 FINAL  Final  MRSA PCR Screening     Status: None   Collection Time: 11/27/19  3:55 PM   Specimen: Nasopharyngeal  Result Value Ref Range Status   MRSA by PCR NEGATIVE NEGATIVE Final    Comment:        The GeneXpert MRSA Assay (FDA approved for NASAL specimens only), is one component of a comprehensive MRSA colonization surveillance program. It is not intended to diagnose MRSA infection  nor to guide or monitor treatment for MRSA infections. Performed at [facility], [address], [place], [place] [address]            Radiology Studies: No results found.      Scheduled Meds: .  stroke: mapping our early stages of recovery book   Does not apply Once  . amiodarone  200 mg Oral Daily  . amLODipine  5 mg Oral Daily  . bisacodyl  10 mg Rectal Once  . clopidogrel  75 mg Oral Daily  . feeding supplement (PRO-STAT SUGAR FREE 64)  30 mL Per Tube Daily  . ferrous sulfate  325 mg Oral BID WC  . heparin injection (subcutaneous)  5,000 Units Subcutaneous Q8H  . insulin aspart  0-20 Units Subcutaneous Q4H  . insulin detemir  15 Units Subcutaneous BID  . ipratropium-albuterol  3 mL Nebulization Once  . ipratropium-albuterol  3 mL Nebulization TID  . mouth rinse  15 mL Mouth Rinse BID  . pantoprazole sodium  40 mg Per Tube Daily  . rosuvastatin  40 mg Oral q1800   Continuous Infusions: . sodium chloride Stopped (11/27/19 0820)  . sodium chloride Stopped (12/04/19 0325)  . feeding supplement (JEVITY 1.2 CAL) 1,000 mL (12/02/19 0600)     LOS: 8 days    Time spent: 30 mins    Wyvonnia Dusky, MD [facility] Pager [phone]  If 7PM-7AM, please contact night-coverage [email or web address] Password [email or web address] 12/04/2019, 7:19 AM

## 2019-12-04 NOTE — Progress Notes (Signed)
CBG of 234

## 2019-12-04 NOTE — Evaluation (Signed)
Physical Therapy Evaluation Patient Details Name: Brett [name] MRN: [id number] DOB: 10/05/1935 Today's Date: 12/04/2019   History of Present Illness  Patient present with AMS, found down at home. PMH includes CAD, DM, CKD, HTN, CHF  Clinical Impression  Patient received in bed, agrees to PT assessment. Patient is very [place], have to speak almost directly into his ear. He denies pain, required min assist to get from supine to sitting. Sat edge of bed with feet supported x 5 min. Performed sit to stand transfer with min assist from low bed. Patient then stood again with rolling walker and with min assist was able to walk a few steps over to the recliner. He will continue to benefit from skilled PT while here to improve strength, safety and independence with mobility.       Follow Up Recommendations SNF;Supervision for mobility/OOB    Equipment Recommendations  None recommended by PT    Recommendations for Other Services       Precautions / Restrictions Precautions Precautions: Fall Restrictions Weight Bearing Restrictions: No      Mobility  Bed Mobility Overal bed mobility: Needs Assistance Bed Mobility: Supine to Sit     Supine to sit: Min assist     General bed mobility comments: min assist to raise trunk to seated position  Transfers Overall transfer level: Needs assistance Equipment used: Rolling walker (2 wheeled) Transfers: Sit to/from Stand Sit to Stand: Min assist         General transfer comment: stands with cues for hand placement for low bed with min assist  Ambulation/Gait Ambulation/Gait assistance: Min assist Gait Distance (Feet): 4 Feet Assistive device: Rolling walker (2 wheeled) Gait Pattern/deviations: Step-to pattern;Decreased stride length Gait velocity: decreased   General Gait Details: able to take a few steps from bed to recliner using rw and min assist  Stairs            Wheelchair Mobility    Modified Rankin (Stroke Patients  Only)       Balance Overall balance assessment: Needs assistance Sitting-balance support: Feet supported Sitting balance-Leahy Scale: Good     Standing balance support: Bilateral upper extremity supported;During functional activity Standing balance-Leahy Scale: Fair Standing balance comment: reliant on rw at this time                             Pertinent Vitals/Pain Pain Assessment: No/denies pain    Home Living Family/patient expects to be discharged to:: Private residence Living Arrangements: Alone Available Help at Discharge: Friend(s);Neighbor Type of Home: House Home Access: Stairs to enter Entrance Stairs-Rails: Left Entrance Stairs-Number of Steps: 3 [place]: One level Home Equipment: Jennette - 2 wheels;Cane - single point;Toilet riser      Prior Function Level of Independence: Independent         Comments: Indep without assist device for basic transfers and gait; denies fall history.  Has access to [facility] and RW if needed, but does "not want to rely on that".  Home O2 at 2L     Hand Dominance   Dominant Hand: Right    Extremity/Trunk Assessment   Upper Extremity Assessment Upper Extremity Assessment: Generalized weakness    Lower Extremity Assessment Lower Extremity Assessment: Generalized weakness    Cervical / Trunk Assessment Cervical / Trunk Assessment: Normal  Communication   Communication: HOH  Cognition Arousal/Alertness: Awake/alert Behavior During Therapy: WFL for tasks assessed/performed Overall Cognitive Status: Within Functional Limits for  tasks assessed                                 General Comments: not oriented to date      General Comments      Exercises     Assessment/Plan    PT Assessment Patient needs continued PT services  PT Problem List Decreased strength;Decreased activity tolerance;Decreased balance;Decreased mobility       PT Treatment Interventions Therapeutic exercise;Gait  training;Functional mobility training;Therapeutic activities;Patient/family education    PT Goals (Current goals can be found in the Care Plan section)  Acute Rehab PT Goals Patient Stated Goal: i hope i can go home tomorrow PT Goal Formulation: With patient Time For Goal Achievement: 12/11/19 Potential to Achieve Goals: Fair    Frequency Min 2X/week   Barriers to discharge Decreased caregiver support      Co-evaluation               AM-PAC PT "6 Clicks" Mobility  Outcome Measure Help needed turning from your back to your side while in a flat bed without using bedrails?: A Little Help needed moving from lying on your back to sitting on the side of a flat bed without using bedrails?: A Little Help needed moving to and from a bed to a chair (including a wheelchair)?: A Little Help needed standing up from a chair using your arms (e.g., wheelchair or bedside chair)?: A Little Help needed to walk in hospital room?: A Lot Help needed climbing 3-5 steps with a railing? : A Lot 6 Click Score: 16    End of Session Equipment Utilized During Treatment: Gait belt Activity Tolerance: Patient tolerated treatment well Patient left: in chair;with call bell/phone within reach;with chair alarm set;Other (comment)(SLP present when i left) Nurse Communication: Mobility status PT Visit Diagnosis: Unsteadiness on feet (R26.81);Muscle weakness (generalized) (M62.81);Difficulty in walking, not elsewhere classified (R26.2);History of falling (Z91.81)    Time: 5790-3833 PT Time Calculation (min) (ACUTE ONLY): 33 min   Charges:   PT Evaluation $PT Eval Moderate Complexity: 1 Mod PT Treatments $Gait Training: 8-22 mins        Konrad Hoak, PT, GCS 12/04/19,2:20 PM

## 2019-12-04 NOTE — Progress Notes (Signed)
Speech Language Pathology Treatment: Dysphagia  Patient Details Name: Brett [name] MRN: [id number] DOB: [birth year] Today's Date: 12/04/2019 Time: 1400-1500 SLP Time Calculation (min) (ACUTE ONLY): 60 min  Assessment / Plan / Recommendation Clinical Impression  Pt seen for ongoing assessment of swallowing; toleration of current dysphagia diet and po trials to upgrade diet consistency if able/appropriate. Pt's NG has been removed per MD order. NSG notes reported pt is tolerating current diet of puree and Nectar liquids w/ no overt s/s of aspiration noted(this morning, NSG report). Today, pt presents w/ increased alertness and much verbal engagement giving biographical information re: self(moved from [place] at age 85 to attend school, married, family/Son). Speech fairly clear; pt followed instructions w/ min cues. Pt is HOH.  He appeared to adequately tolerate trials of Nectar liquids w/ no overt clinical s/s of aspiration noted. With trials of thin liquids via Cup, however, pt presents w/ pharyngeal phase dysphagia c/b delayed coughing intermittently b/t trials of thin liquids; also noted impulsive drinking behavior(this can increase risk for aspiration, choking). Pt was given education and instruction to slow down, however, he did not carry over the instruction in his behaviors -- unsure of pt's full Cognitive status/comprehension still at this time. When boluses were most controlled for small, Single sips, pt exhibited no overt coughing post trials. Due to the inconsistency of toleration, trials of thin liquids were not continued. During the oral phase, pt exhibited grossly adequate oral phase time for bolus management of liquids and puree; w/ trials of increased textured foods (broken down solids), he demonstrated min increased mastication time/effort but was able to swallow/clear trials appropriately. Pt is missing Dentition at baseline which impacts effective mastication of solids. Food and liquid  trials were alternated to aid oral clearing.   Recommend upgrade of diet to a dysphagia level 2 (MINCED foods w/ gravies) diet w/ NECTAR consistency liquids w/ general aspiration precautions; tray setup at meals; monitoring w/ po intake. Pills WHOLE in puree as able. ST services will f/u w/ MBSS to give objective assessment of pt's pharyngeal swallowing status. NSG/MD updated. Dietician following.      HPI HPI: Pt is a 85 y.o. male with past medical history including multiple medical issues of CAD, CHF, diabetes, COPD, emphysema, B-cell lymphoma, with frequent admissions for pneumonia, here with altered mental status.  History is limited secondary to aphasia.  Per report, the patient was found down in his house, confused, making nonsensical speech by a neighbor, who heard him banging on the wall.  I called and discussed with his friend, who is his point of contact, and states the last time that he was seen normal was before 9 PM yesterday.  He does not know when, but states that he called 9 the patient did seem somewhat confused with mild slurring, though he was otherwise making sense.  He does not recall if the patient has been sick and states that patient did not voice any complaints about feeling unwell.  Remainder of history is limited secondary to confusion.  MD is unsure of pt's baseline Cognitive functioning.  CXR: "Bilateral interstitial prominence may reflect interstitial edema", "A small left pleural effusion is questioned".  MRI - Negative.  NG now removed.       SLP Plan  Continue with current plan of care       Recommendations  Diet recommendations: Dysphagia 2 (fine chop);Nectar-thick liquid Liquids provided via: Cup;Straw(monitor) Medication Administration: Whole meds with puree(for safer swallowing) Supervision: Patient able to self feed;Intermittent supervision  to cue for compensatory strategies Compensations: Minimize environmental distractions;Slow rate;Small sips/bites;Lingual  sweep for clearance of pocketing;Multiple dry swallows after each bite/sip;Follow solids with liquid Postural Changes and/or Swallow Maneuvers: Seated upright 90 degrees;Upright 30-60 min after meal                General recommendations: (Dietician f/u) Oral Care Recommendations: Oral care BID;Oral care before and after PO;Staff/trained caregiver to provide oral care Follow up Recommendations: Skilled Nursing facility(TBD) SLP Visit Diagnosis: Dysphagia, oropharyngeal phase (R13.12)(poor Dentition status baseline) Plan: Continue with current plan of care       Shirley, [place], CCC-SLP Brett [name] 12/04/2019, 4:38 PM

## 2019-12-05 ENCOUNTER — Inpatient Hospital Stay: Payer: Medicare PPO

## 2019-12-05 DIAGNOSIS — C8510 Unspecified B-cell lymphoma, unspecified site: Secondary | ICD-10-CM

## 2019-12-05 LAB — BASIC METABOLIC PANEL
Anion gap: 9 (ref 5–15)
BUN: 44 mg/dL — ABNORMAL HIGH (ref 8–23)
CO2: 28 mmol/L (ref 22–32)
Calcium: 8.2 mg/dL — ABNORMAL LOW (ref 8.9–10.3)
Chloride: 106 mmol/L (ref 98–111)
Creatinine, Ser: 1.79 mg/dL — ABNORMAL HIGH (ref 0.61–1.24)
GFR calc Af Amer: 40 mL/min — ABNORMAL LOW (ref >60)
GFR calc non Af Amer: 35 mL/min — ABNORMAL LOW (ref >60)
Glucose, Bld: 158 mg/dL — ABNORMAL HIGH (ref 70–99)
Potassium: 4.3 mmol/L (ref 3.5–5.1)
Sodium: 143 mmol/L (ref 135–145)

## 2019-12-05 LAB — CBC
HCT: 32.7 % — ABNORMAL LOW (ref 39.0–52.0)
Hemoglobin: 9.8 g/dL — ABNORMAL LOW (ref 13.0–17.0)
MCH: 27.4 pg (ref 26.0–34.0)
MCHC: 30 g/dL (ref 30.0–36.0)
MCV: 91.3 fL (ref 80.0–100.0)
Platelets: 229 10*3/uL (ref 150–400)
RBC: 3.58 MIL/uL — ABNORMAL LOW (ref 4.22–5.81)
RDW: 14.6 % (ref 11.5–15.5)
WBC: 6.3 10*3/uL (ref 4.0–10.5)
nRBC: 0 % (ref 0.0–0.2)

## 2019-12-05 LAB — GLUCOSE, CAPILLARY
Glucose-Capillary: 130 mg/dL — ABNORMAL HIGH (ref 70–99)
Glucose-Capillary: 148 mg/dL — ABNORMAL HIGH (ref 70–99)
Glucose-Capillary: 150 mg/dL — ABNORMAL HIGH (ref 70–99)
Glucose-Capillary: 170 mg/dL — ABNORMAL HIGH (ref 70–99)
Glucose-Capillary: 87 mg/dL (ref 70–99)

## 2019-12-05 LAB — SARS CORONAVIRUS 2 (TAT 6-24 HRS): SARS Coronavirus 2: NEGATIVE

## 2019-12-05 MED ORDER — METOPROLOL SUCCINATE ER 50 MG PO TB24: 50.0000 mg | ORAL_TABLET | Freq: Two times a day (BID) | ORAL | Status: DC

## 2019-12-05 MED ORDER — PANTOPRAZOLE SODIUM 40 MG PO TBEC
40.0000 mg | DELAYED_RELEASE_TABLET | Freq: Every day | ORAL | Status: DC
  Administered 2019-12-05: 40 mg via ORAL
  Filled 2019-12-05: qty 1

## 2019-12-05 MED ORDER — STROKE: EARLY STAGES OF RECOVERY BOOK: 1.0000 | Freq: Once | Status: AC

## 2019-12-05 NOTE — Progress Notes (Signed)
Nutrition Follow-up  DOCUMENTATION CODES:   Obesity unspecified  INTERVENTION:  Discontinued prior tube feed orders.  Provide Hormel Shake po BID with lunch and dinner, each supplement provides 520 kcal and 22 grams of protein.  Provide Magic cup TID with meals, each supplement provides 290 kcal and 9 grams of protein.  NUTRITION DIAGNOSIS:   Inadequate oral intake related to inability to eat as evidenced by NPO status.  Diet now advanced but PO intake remains inadequate.  GOAL:   Patient will meet greater than or equal to 90% of their needs  Progressing.  MONITOR:   Diet advancement, PO intake, Labs, Weight trends, I & O's  REASON FOR ASSESSMENT:   Consult Malnutrition Eval, Enteral/tube feeding initiation and management  ASSESSMENT:   85 year old male with PMHx of HTN, HLD, DM, CAD, B-cell lymphoma, PUD, emphysema of lung, COPD, CHF admitted with acute metabolic encephalopathy, sepsis, acute hypoxic respiratory failure, AKI.  Patient's diet was advanced to dysphagia 2 with nectar-thick liquids on 1/5. Tube feeds were stopped and NGT has been removed. PO intake remains poor. Patient had 0% of breakfast this morning. According to discussion with SLP and RN he is more alert now. Will discontinue old tube feed orders and order nectar-thick supplements to come on trays.  Medications reviewed and include: amiodraone, Colace 100 mg daily, ferrous sulfate 325 mg BID, Novolog 0-20 units Q4hrs, Levemir 15 units BID, Protonix, D5W at 50 mL/hr.  Labs reviewed: CBG 130-308, BUN 44, Creatinine 1.79.  Diet Order:   Diet Order            DIET DYS 2 Room service appropriate? Yes with Assist; Fluid consistency: Nectar Thick  Diet effective now             EDUCATION NEEDS:   No education needs have been identified at this time  Skin:  Skin Assessment: Reviewed RN Assessment  Last BM:  12/04/2019 - large type 6  Height:   Ht Readings from Last 1 Encounters:  11/27/19 5\' 8"   (1.727 m)   Weight:   Wt Readings from Last 1 Encounters:  12/04/19 89.3 kg   Ideal Body Weight:  70 kg  BMI:  Body mass index is 29.93 kg/m.  Estimated Nutritional Needs:   Kcal:  2000-2200  Protein:  100-110 grams  Fluid:  1.8-2 L/day  Jacklynn Barnacle, MS, RD, LDN Office: [phone] Pager: [phone] After Hours/Weekend Pager: [phone]

## 2019-12-05 NOTE — Evaluation (Signed)
Objective Swallowing Evaluation: Type of Study: MBS-Modified Barium Swallow Study   Patient Details  Name: Brett [name] MRN: [id number] Date of Birth: [birth year]  Today's Date: 12/05/2019 Time: SLP Start Time (ACUTE ONLY): 1010 -SLP Stop Time (ACUTE ONLY): 1110  SLP Time Calculation (min) (ACUTE ONLY): 60 min   Past Medical History:  Past Medical History:  Diagnosis Date  . Anemia   . Aortic regurgitation   . B-cell lymphoma ([place]) 2009   DX AT [facility]  . Bronchitis   . CAD (coronary artery disease)   . Carotid stenosis   . CHF (congestive heart failure) ([place])   . Diabetes mellitus without complication ([place])   . Diabetes mellitus, type 2 ([place])   . Emphysema of lung ([place])   . Essential hypertension   . History of chemotherapy   . Hyperlipidemia   . Hypertension   . IDA (iron deficiency anemia)   . Leg edema   . Meralgia paraesthetica   . Mitral regurgitation   . PUD (peptic ulcer disease)   . PVD (peripheral vascular disease) ([place])   . Tobacco abuse    Past Surgical History:  Past Surgical History:  Procedure Laterality Date  . CARDIAC CATHETERIZATION  08/15/2007  . CARDIAC CATHETERIZATION Right 07/13/2016   Procedure: Right/Left Heart Cath and Coronary Angiography;  Surgeon: Dionisio David, MD;  Location: [facility];  Service: Cardiovascular;  Laterality: Right;  . COLONOSCOPY  03/2013  . ESOPHAGOGASTRODUODENOSCOPY  03/2013   HPI: Pt is a 85 y.o. male with past medical history including multiple medical issues of CAD, CHF, diabetes, COPD, emphysema, B-cell lymphoma, with frequent admissions for pneumonia, here with altered mental status.  History is limited secondary to aphasia.  Per report, the patient was found down in his house, confused, making nonsensical speech by a neighbor, who heard him banging on the wall.  I called and discussed with his friend, who is his point of contact, and states the last time that he was seen normal was before 9 PM yesterday.  He  does not know when, but states that he called 9 the patient did seem somewhat confused with mild slurring, though he was otherwise making sense.  He does not recall if the patient has been sick and states that patient did not voice any complaints about feeling unwell.  Remainder of history is limited secondary to confusion.  MD is unsure of pt's baseline Cognitive functioning.  CXR: "Bilateral interstitial prominence may reflect interstitial edema", "A small left pleural effusion is questioned".  MRI - Negative.  NG now removed.    Subjective: pt sitting in chair; verbally responsive - pt is HOH. Needed cues to follow through w/ instruction. Pt is inconsistent w/ follow through.     Assessment / Plan / Recommendation  CHL IP CLINICAL IMPRESSIONS 12/05/2019  Clinical Impression Pt appears to present w/ Mild+ oropharyngeal phase dysphagia; this increases his risk for potential Aspiration thus Pulmonary impact/decline. During the oral phase, pt exhibited increased oral phase time for mastication and bolus management/prep for A-P transfer for swallowing. Pt is Missing MOST Dentition. Slight oral residue remaining which pt cleared independently w/ f/u lingual sweep/swallow. Noted premature spillage w/ thin liquid consistency. During the pharyngeal phase, pt exhibited a fairly consistent pharyngeal swallow initiation at the level of the Valleculae w/ all consistencies; pt appeared to adequate attain airway closure but min decreased tightness of closure noted intermittently. noted Min+ diffuse pharyngeal residue throughout the pharynx in the Valleculae and Pyriform Sinuses --  a f/u, Dry swallow (independently done) appeared to reduce and/or clear this pharyngeal residue. Of note, LESS bolus residue noted to remain w/ trials of increased texture, including Nectar consistency liquids(maybe more tactile information resulting in more effort during the swallow?). No immediate Aspiration noted During the swallow. However,  it appeared there was Slight, Trace laryngeal Penetration BETWEEN swallows from the pharyngeal residue in the Pyriform Sinuses(mostly). Cervical Esophagus vieweing was limited d/t shoulders obscuring. However, pt exhibited consistent, Moderate Belching w/ oral intake/trials. ANY Esophageal phase dysmotility can increase risk for Regurgitation/Reflux episodes which can increased risk for aspiration of Reflux thus Pulmonary decline.   SLP Visit Diagnosis Dysphagia, oropharyngeal phase (R13.12)  Attention and concentration deficit following --  Frontal lobe and executive function deficit following --  Impact on safety and function Mild aspiration risk;Moderate aspiration risk;Risk for inadequate nutrition/hydration      CHL IP TREATMENT RECOMMENDATION 12/05/2019  Treatment Recommendations Defer treatment plan to f/u with SLP     Prognosis 12/05/2019  Prognosis for Safe Diet Advancement Fair  Barriers to Reach Goals Cognitive deficits;Time post onset;Severity of deficits;Behavior  Barriers/Prognosis Comment --    CHL IP DIET RECOMMENDATION 12/05/2019  SLP Diet Recommendations Dysphagia 2 (Fine chop) solids;Nectar thick liquid  Liquid Administration via Cup;Straw  Medication Administration Whole meds with puree  Compensations Minimize environmental distractions;Slow rate;Small sips/bites;Lingual sweep for clearance of pocketing;Multiple dry swallows after each bite/sip;Follow solids with liquid  Postural Changes Remain semi-upright after after feeds/meals (Comment);Seated upright at 90 degrees      CHL IP OTHER RECOMMENDATIONS 12/05/2019  Recommended Consults Consider GI evaluation  Oral Care Recommendations Oral care BID;Oral care before and after PO;Staff/trained caregiver to provide oral care  Other Recommendations Order thickener from pharmacy;Prohibited food (jello, ice cream, thin soups);Remove water pitcher;Have oral suction available      CHL IP FOLLOW UP RECOMMENDATIONS 12/05/2019  Follow up  Recommendations Skilled Nursing facility      [facility] IP FREQUENCY AND DURATION 12/05/2019  Speech Therapy Frequency (ACUTE ONLY) min 3x week  Treatment Duration 2 weeks           CHL IP ORAL PHASE 12/05/2019  Oral Phase Impaired  Oral - Pudding Teaspoon --  Oral - Pudding Cup --  Oral - Honey Teaspoon --  Oral - Honey Cup NT  Oral - Nectar Teaspoon --  Oral - Nectar Cup 2   Oral - Nectar Straw --  Oral - Thin Teaspoon --  Oral - Thin Cup 10+  Oral - Thin Straw --  Oral - Puree 3  Oral - Mech Soft 2  Oral - Regular NT  Oral - Multi-Consistency --  Oral - Pill --  Oral Phase - Comment pt exhibited increased oral phase time for mastication and bolus management/prep for A-P transfer for swallowing. Pt is Missing MOST Dentition. Slight oral residue remaining which pt cleared independently w/ f/u lingual sweep/swallow. Noted premature spillage w/ thin liquid consistency.     CHL IP PHARYNGEAL PHASE 12/05/2019  Pharyngeal Phase Impaired  Pharyngeal- Pudding Teaspoon --  Pharyngeal --  Pharyngeal- Pudding Cup --  Pharyngeal --  Pharyngeal- Honey Teaspoon --  Pharyngeal --  Pharyngeal- Honey Cup NT  Pharyngeal --  Pharyngeal- Nectar Teaspoon --  Pharyngeal --  Pharyngeal- Nectar Cup 2  Pharyngeal --  Pharyngeal- Nectar Straw --  Pharyngeal --  Pharyngeal- Thin Teaspoon --  Pharyngeal --  Pharyngeal- Thin Cup 10+  Pharyngeal --  Pharyngeal- Thin Straw --  Pharyngeal --  Pharyngeal- Puree 3  Pharyngeal --  Pharyngeal- Mechanical Soft 2  Pharyngeal --  Pharyngeal- Regular NT  Pharyngeal --  Pharyngeal- Multi-consistency --  Pharyngeal --  Pharyngeal- Pill --  Pharyngeal --  Pharyngeal Comment pt exhibited a fairly consistent pharyngeal swallow initiation at the level of the Valleculae w/ all consistencies; pt appeared to adequate attain airway closure but min decreased tightness of closure noted intermittently. noted Min+ diffuse pharyngeal residue throughout the pharynx in  the Valleculae and Pyriform Sinuses -- a f/u, Dry swallow (independently done) appeared to reduce and/or clear this pharyngeal residue. Of note, LESS bolus residue noted to remain w/ trials of increased texture, including Nectar consistency liquids(maybe more tactile information resulting in more effort during the swallow?). No immediate Aspiration noted During the swallow. However, it appeared there was Slight, Trace laryngeal Penetration BETWEEN swallows from the pharyngeal residue in the Pyriform Sinuses(mostly).      CHL IP CERVICAL ESOPHAGEAL PHASE 12/05/2019  Cervical Esophageal Phase WFL  Pudding Teaspoon --  Pudding Cup --  Honey Teaspoon --  Honey Cup --  Nectar Teaspoon --  Nectar Cup --  Nectar Straw --  Thin Teaspoon --  Thin Cup --  Thin Straw --  Puree --  Mechanical Soft --  Regular --  Multi-consistency --  Pill --  Cervical Esophageal Comment --        [name], MS, CCC-SLP Echo Propp 12/05/2019, 11:44 AM

## 2019-12-05 NOTE — TOC Progression Note (Signed)
Transition of Care [facility]) - Progression Note    Patient Details  Name: Brett [name] MRN: [id number] Date of Birth: 06-09-1935  Transition of Care [facility]) CM/SW [place], LCSW Phone Number: 12/05/2019, 8:37 AM  Clinical Narrative: Yesterday's COVID test came back negative. [facility] admissions coordinator has insurance approval. They have a bed for him today if stable.    Expected Discharge Plan and Services                                                 Social Determinants of Health (SDOH) Interventions    Readmission Risk Interventions No flowsheet data found.

## 2019-12-05 NOTE — Progress Notes (Signed)
Pt for discharge to [facility] via [facility] . [facility] called for transport and report called to taliah  At [facility].  Pt had lge bm earlier.

## 2019-12-05 NOTE — Discharge Summary (Signed)
ERCIL CASSIS KKX:[id number] DOB: [birth year] DOA: 11/25/2019  PCP: Perrin Maltese, MD  Admit date: 11/25/2019 Discharge date: 12/05/2019  Admitted From: Home Disposition:  [facility]  Recommendations for Outpatient Follow-up:  1. Follow up with PCP in 3-7 days 2. Please obtain BMP/CBC in 3 days 3. Please follow up on the following pending results:none  Home Health:no   Discharge Condition:Stable CODE STATUS:  Diet recommendation: Dysphagia level 1 (puree) w/ HONEY consistency liquids VIA TSP; aspiration precautions; full feeding support and Supervision during meals -- reduce distractions Medication Administration: Crushed with puree(as able for safer swallowing)  Oral care BID;Oral care before and after PO Brief/Interim Summary:  BRADYN [name] is a 85 y.o. male with medical history significant of CAD, CHF, diabetes, COPD with frequent admissions for pneumonia, Anemia, B-cell lymphoma status post chemotherapy, CAD, carotid stenosis, DM2, HTN, HLD, peptic ulcer disease, peripheral vascular disease fibrillation Presented with   AMS and aphasia, found down in his house confused.CT brain x 2, MRI brain show no acute intracranial abnormalities. EEG shows slowing but no epileptiform activity. Neurology was consulted and following the patient.  Neurology thought his mental status was likely multifactorial in etiology related to metabolic multiple issues.  Although these issues were slowly improving it would not be unusual for the patient's cognitive status to lag behind improvement in the lab work.  PT OT were consulted.  Speech therapy was consulted.  PT and OT recommended SNF.  Speech therapy recommended the above diet. Patient also has eyesight and hard of hearing which I verified this morning with Brett [name]. Johnson's patient's MPOA and friend .  Patient was also found with hypernatremia and free water was given via IV fluids with improvement. Possibly acute metabolic  encephalopathy:  pt is still confused but eating by mouth today. Not at pt's baseline as per pt's MPOA, pt is normally alert & oriented & able to ambulate independently. MPOA is pt's friend Brett [name]. CT brain & MRI brain showed no acute abnormalities. Repeat CT scan shows no acute abnormalities. PT/OT re-consulted. EEG shows slowing but no epileptiform activity. Neuro following and recs apprec. Speech following and recs apprec   Dysphagia: NG tube d/c'D. SEE DIET ABOVE. Hypernatremia: free water deficit 1.0L.  Improved with IV fluids.  Needs to follow-up with PCP in 3 to 7 days and have BMP done  Sepsis: etiology unclear. Blood cxs NGTD & urine cx shows no growth. Tylenol prn for fevers. Completed abx course. Resolved  Possible pneumonia: possible atypical or gram positive. Strep, mycoplasma, legionella are all neg. Completed abx course. Continue on supplemental oxygen and wean as tolerated. Nebs prn   Acute hypoxic respiratory failure:improved. Etiology unclear, possible CHF exacerbation and/or pneumonia. Hold lasix for now  secondary to AKI. Completed abx course. Continue on supplemental oxygen and wean as tolerated.f/u with pcp  Acute on chronic diastolicheart failure: improved. Lasix on hold as above. F/u with pcp. Monitor I/Os.   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 Monitor as outpt. Encourage po intake Avoid nephrotoxic meds.    B-cell lymphoma: chronic & currently in remission  COPD: continue nebs, incentive spirometry. VBG obtained no evidence of CO2 retention to explain mental status changes.  HTN: beta blk, amlodipine. If need to increase beta blk or add hydralazine  CAD: chronic & stable-beta blk, imdur, amlodipine, plavix & statin   HLD: continue statin   DM2: poorly controlled.resume po med. Add RISS.monitor fs closely  Possible PAF Patient  apparently not anticoagulation he has amiodarone with his list of medication & his  MPOAwas unaware the patient has atrial fibrillation. Resume amiodarone. F/u pcp or cards as outpt. Also resume beta blk, restarted at lower dose 50mg  po top xl bid  Elevatedlactic acid: resolved. Etiology unclear.   Abdominal distention: etiology unclear. KUB & CT abd/pelvis shows no acute abnormailities   Transaminitis: etiology unclear. Resolved  Normocytic anemia: etiology unclear. No need for a transfusion at this time. Will continue to monitor   Right eye blindness: chronic as per pt's MPOA  Discharge Diagnoses:  Active Problems:   B-cell lymphoma (HCC)   COPD (chronic obstructive pulmonary disease) (HCC)   Sepsis (HCC)   HTN (hypertension)   Diabetes (HCC)   CAD (coronary artery disease)   Chronic diastolic heart failure (HCC)   Stroke (HCC)   COPD exacerbation (HCC)   Type 2 diabetes mellitus with hyperosmolar nonketotic hyperglycemia (HCC)   AF (paroxysmal atrial fibrillation) (HCC)   Acute encephalopathy   High serum osmolar gap   Dysphagia following unspecified cerebrovascular disease   Hypernatremia    Discharge Instructions  Discharge Instructions    Call MD for:  temperature >100.4   Complete by: As directed    Diet - low sodium heart healthy   Complete by: As directed    Increase activity slowly   Complete by: As directed      Allergies as of 12/05/2019   No Known Allergies     Medication List    STOP taking these medications   furosemide 20 MG tablet Commonly known as: LASIX   hydrALAZINE 100 MG tablet Commonly known as: APRESOLINE     TAKE these medications    stroke: mapping our early stages of recovery book Misc 1 each by Does not apply route once for 1 dose.   albuterol 108 (90 Base) MCG/ACT inhaler Commonly known as: VENTOLIN HFA Inhale 2 puffs into the lungs every 6 (six) hours as needed for wheezing or shortness of breath.   amiodarone 200 MG tablet Commonly known as: PACERONE Take 1 tablet (200 mg total) by mouth daily.    amLODipine 5 MG tablet Commonly known as: NORVASC Take 5 mg by mouth daily.   ammonium lactate 12 % lotion Commonly known as: LAC-HYDRIN Apply topically 2 (two) times daily.   clopidogrel 75 MG tablet Commonly known as: PLAVIX Take 75 mg by mouth daily.   cyanocobalamin 1000 MCG/ML injection Commonly known as: (VITAMIN B-12) Inject 1,000 mcg into the muscle every 30 (thirty) days.   ferrous sulfate 325 (65 FE) MG tablet Take 1 tablet (325 mg total) by mouth 2 (two) times daily with a meal.   gabapentin 100 MG capsule Commonly known as: NEURONTIN Take 1 capsule by mouth 2 (two) times daily.   glimepiride 2 MG tablet Commonly known as: AMARYL Take 1 tablet (2 mg total) by mouth daily with breakfast. What changed: how much to take   ipratropium-albuterol 0.5-2.5 (3) MG/3ML Soln Commonly known as: DUONEB J44.1  81ml nebulizer every six hours as needed for shortness of breath   isosorbide mononitrate 30 MG 24 hr tablet Commonly known as: IMDUR Take 30 mg by mouth daily.   metoprolol succinate 50 MG 24 hr tablet Commonly known as: TOPROL-XL Take 1 tablet (50 mg total) by mouth 2 (two) times daily. What changed:   medication strength  how much to take  when to take this   mometasone-formoterol 100-5 MCG/ACT Aero Commonly known as: DULERA Inhale 2  puffs into the lungs 2 (two) times daily.   pantoprazole 40 MG tablet Commonly known as: PROTONIX Take 40 mg by mouth daily.   rosuvastatin 40 MG tablet Commonly known as: CRESTOR Take 40 mg by mouth daily.   terbinafine 1 % cream Commonly known as: LAMISIL Apply twice a day to feet (okay to substitute any anti fungal cream that is the cheapest)   tiotropium 18 MCG inhalation capsule Commonly known as: Spiriva HandiHaler Place 1 capsule (18 mcg total) into inhaler and inhale daily.   Vitamin D (Ergocalciferol) 1.25 MG (50000 UT) Caps capsule Commonly known as: DRISDOL Take 50,000 Units by mouth once a week.        Contact information for follow-up providers    Perrin Maltese, MD Follow up in 3 day(s).   Specialty: Internal Medicine Contact information: [place] [address] [phone]            Contact information for after-discharge care    [facility] .   Service: Skilled Nursing Contact information: [place] [phone]                 No Known Allergies  Consultations:  neurology   Procedures/Studies: EEG  Result Date: 11/27/2019 Alexis Goodell, MD     11/27/2019  3:47 PM ELECTROENCEPHALOGRAM REPORT Patient: Brett [name]       Room #: IC18A-AA EEG No. ID: [id number] Age: 85 y.o.        Sex: male Referring Physician: Jimmye Norman Report Date:  11/27/2019       Interpreting Physician: Alexis Goodell History: KHALEL ALMS is an 85 y.o. male with altered mental status Medications: Lorazepam, Zithromax, Rocephin, Insulin Conditions of Recording:  This is a 21 channel routine scalp EEG performed with bipolar and monopolar montages arranged in accordance to the international 10/20 system of electrode placement. One channel was dedicated to EKG recording. The patient is in the altered state. Description:  The background activity is low voltage, slow and poorly organized resembling that of drowse with a mixture of theta and delta activity noted.  This activity is continuous and diffusely distributed.   No epileptiform activity is noted.  Hyperventilation and intermittent photic stimulation were not performed. IMPRESSION: This electroencephalogram is characterized by slowing.  Although this slowing may represent drowse, can not rule out slowing related to a diffuse cerebral disturbance such as a metabolic encephalopathy.  Clinical correlation recommended.  No epileptiform activity is noted.  Alexis Goodell, MD Neurology [phone] 11/27/2019, 3:42 PM   CT ABDOMEN PELVIS WO  CONTRAST  Result Date: 11/26/2019 CLINICAL DATA:  Abdominal distension with elevated lactate. EXAM: CT ABDOMEN AND PELVIS WITHOUT CONTRAST TECHNIQUE: Multidetector CT imaging of the abdomen and pelvis was performed following the standard protocol without IV contrast. COMPARISON:  None. FINDINGS: LOWER CHEST: No basilar pleural or apical pericardial effusion. HEPATOBILIARY: Diffuse hypoattenuation of the liver relative to the spleen suggests hepatic steatosis. No focal liver lesion or biliary dilatation. There is focal fatty sparing along the gallbladder fossa and along the anterior hepatic edge. Normal gallbladder. PANCREAS: Normal pancreas. No ductal dilatation or peripancreatic fluid collection. SPLEEN: Normal. ADRENALS/URINARY TRACT: --Adrenal glands: Normal. --Kidneys and ureters: No hydronephrosis, nephroureterolithiasis or solid renal mass. Mild bilateral perinephric stranding, likely chronic. --Urinary bladder: Normal for degree of distention STOMACH/BOWEL: --Stomach/Duodenum: No hiatal hernia. Normal duodenal course and caliber. --Small bowel: No dilatation or inflammation. --Colon: No focal abnormality. --Appendix:  Normal. VASCULAR/LYMPHATIC: There is calcific atherosclerosis of the abdominal aorta. No abdominal or pelvic lymphadenopathy. REPRODUCTIVE: Normal prostate size with symmetric seminal vesicles. MUSCULOSKELETAL. No bony spinal canal stenosis or focal osseous abnormality. OTHER: None. IMPRESSION: 1. No acute abdominal or pelvic abnormality. 2. Hepatic steatosis. 3. Aortic Atherosclerosis (ICD10-I70.0). Electronically Signed   By: Ulyses Jarred M.D.   On: 11/26/2019 00:26   DG Chest 1 View  Result Date: 11/30/2019 CLINICAL DATA:  Confirm NG tube placement. EXAM: CHEST  1 VIEW COMPARISON:  Chest and abdominal radiographs yesterday. FINDINGS: Enteric tube tip is below the diaphragm, not included in the field of view. Enteric side-port is not seen on the current exam. Unchanged heart size and  mediastinal contours. Vascular congestion. Possible small pleural effusions/atelectasis at the bases. Patient's chin obscures the right lung apex. IMPRESSION: 1. Enteric tube tip below the diaphragm, not included in the field of view. 2. Vascular congestion with possible small pleural effusions/atelectasis at the bases. Electronically Signed   By: Keith Rake M.D.   On: 11/30/2019 06:22   DG Abd 1 View  Result Date: 11/30/2019 CLINICAL DATA:  Nasogastric tube placement. EXAM: ABDOMEN - 1 VIEW COMPARISON:  November 29, 2019. FINDINGS: The bowel gas pattern is normal. Distal tip of nasogastric tube is seen in expected position of distal stomach. No radio-opaque calculi or other significant radiographic abnormality are seen. IMPRESSION: Distal tip of nasogastric tube is seen in expected position of distal stomach. No evidence of bowel obstruction or ileus. Electronically Signed   By: Marijo Conception M.D.   On: 11/30/2019 08:53   DG Abd 1 View  Result Date: 11/29/2019 CLINICAL DATA:  Nasogastric tube. EXAM: ABDOMEN - 1 VIEW COMPARISON:  Same day. FINDINGS: The bowel gas pattern is normal. Nasogastric tube tip is seen in the stomach. No radio-opaque calculi or other significant radiographic abnormality are seen. IMPRESSION: Nasogastric tube seen in the stomach. No abnormal bowel dilatation is noted. Electronically Signed   By: Marijo Conception M.D.   On: 11/29/2019 21:19   DG Abd 1 View  Result Date: 11/29/2019 CLINICAL DATA:  NG tube placement EXAM: ABDOMEN - 1 VIEW COMPARISON:  Abdominal radiograph 11/25/2019 FINDINGS: Interval placement of a nasogastric tube with side port projecting at or just beyond the GE junction. Limited view of the upper abdomen without acute abnormality identified. IMPRESSION: Interval placement of a nasogastric tube with side port projecting at or just beyond the GE junction. Recommend advancement for more optimal positioning, approximately 5-6 cm. Electronically Signed   By:  Audie Pinto M.D.   On: 11/29/2019 20:02   DG Abd 1 View  Result Date: 11/25/2019 CLINICAL DATA:  Abdominal distension EXAM: ABDOMEN - 1 VIEW COMPARISON:  CT abdomen pelvis 05/13/2010 FINDINGS: No high-grade obstructive bowel gas pattern. No suspicious calcifications over the gallbladder fossa. Coarse calcification is seen in the region of the left renal pelvis. No calcifications seen along the course of either ureter. Vascular calcium noted in the pelvis. Degenerative changes in the spine and hips. Lung bases are clear. Included mediastinal contours are unremarkable. IMPRESSION: 1. Calcification of the left renal pelvis could reflect, possible urolithiasis versus vascular calcium. No calcifications along the course of the ureters. 2. No high-grade obstructive bowel gas pattern. Electronically Signed   By: Lovena Le M.D.   On: 11/25/2019 22:05   CT HEAD WO CONTRAST  Result Date: 11/28/2019 CLINICAL DATA:  Altered mental status EXAM: CT HEAD WITHOUT CONTRAST TECHNIQUE: Contiguous axial images were obtained from  the base of the skull through the vertex without intravenous contrast. COMPARISON:  11/25/2019 FINDINGS: Brain: There is no acute intracranial hemorrhage, mass-effect, or edema. No new loss of gray-white differentiation. There is no extra-axial fluid collection. Ventricles and sulci are stable in size and configuration. Patchy hypoattenuation in the supratentorial white matter likely reflects stable chronic microvascular ischemic changes. 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 Vascular: There is atherosclerotic calcification at the skull base. Skull: Calvarium is unremarkable. Sinuses/Orbits: Minor mucosal thickening.  No new orbital finding. Other: None. IMPRESSION: No acute abnormality or substantial change since 11/25/2019. Chronic findings detailed above. Electronically Signed   By: Macy Mis M.D.   On: 11/28/2019 15:38   CT Head Wo Contrast  Result Date:  11/25/2019 CLINICAL DATA:  Patient was found down. EXAM: CT HEAD WITHOUT CONTRAST CT CERVICAL SPINE WITHOUT CONTRAST TECHNIQUE: Multidetector CT imaging of the head and cervical spine was performed following the standard protocol without intravenous contrast. Multiplanar CT image reconstructions of the cervical spine were also generated. COMPARISON:  CT scan dated 01/21/2019 FINDINGS: CT HEAD FINDINGS Brain: No evidence of acute infarction, hemorrhage, hydrocephalus, extra-axial collection or mass lesion/mass effect. There appears to be an old small left cerebellar hemisphere infarct, unchanged. Vascular: No hyperdense vessel or unexpected calcification. Skull: Normal. Negative for fracture or focal lesion. Sinuses/Orbits: No acute finding. Other: None CT CERVICAL SPINE FINDINGS Alignment: Straightening of the cervical lordosis. Skull base and vertebrae: No acute fracture. No primary bone lesion. Ankylosis of the C3 and C4 vertebra and left lateral masses. Soft tissues and spinal canal: Hypertrophy of the transverse ligament at C1-2. The spinal cord is not compressed at that level. No prevertebral soft tissue swelling visible canal hematoma. Disc levels: C2-3: No significant disc bulging. Moderate left facet arthritis. No foraminal stenosis. C3-4: No disc bulging or protrusion. The vertebra and left lateral masses are fused. No foraminal stenosis. C4-5: Disc space narrowing. Osteophytes protrude into both neural foramina without significant foraminal stenosis. C5-6: Disc space narrowing with prominent degenerative changes of the vertebral endplates. Broad-based disc osteophyte complex slightly narrows the AP dimension of the spinal canal. Uncinate spurs extend into both neural foramina with moderate bilateral foraminal stenosis. C6-7: Chronic disc space narrowing. Broad-based disc osteophyte complex with moderate left foraminal stenosis. C7-T1: Moderate right facet arthritis. Otherwise negative. Upper chest:  Emphysematous changes at the right lung apex. Other: None IMPRESSION: 1. No acute intracranial abnormality. 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 2. No acute abnormality of the cervical spine. Multilevel degenerative disc and joint disease. Electronically Signed   By: Lorriane Shire M.D.   On: 11/25/2019 15:28   CT Cervical Spine Wo Contrast  Result Date: 11/25/2019 CLINICAL DATA:  Patient was found down. EXAM: CT HEAD WITHOUT CONTRAST CT CERVICAL SPINE WITHOUT CONTRAST TECHNIQUE: Multidetector CT imaging of the head and cervical spine was performed following the standard protocol without intravenous contrast. Multiplanar CT image reconstructions of the cervical spine were also generated. COMPARISON:  CT scan dated 01/21/2019 FINDINGS: CT HEAD FINDINGS Brain: No evidence of acute infarction, hemorrhage, hydrocephalus, extra-axial collection or mass lesion/mass effect. There appears to be an old small left cerebellar hemisphere infarct, unchanged. Vascular: No hyperdense vessel or unexpected calcification. Skull: Normal. Negative for fracture or focal lesion. Sinuses/Orbits: No acute finding. Other: None CT CERVICAL SPINE FINDINGS Alignment: Straightening of the cervical lordosis. Skull base and vertebrae: No acute fracture. No primary bone lesion. Ankylosis of the C3 and C4 vertebra and left lateral masses. Soft tissues  and spinal canal: Hypertrophy of the transverse ligament at C1-2. The spinal cord is not compressed at that level. No prevertebral soft tissue swelling visible canal hematoma. Disc levels: C2-3: No significant disc bulging. Moderate left facet arthritis. No foraminal stenosis. C3-4: No disc bulging or protrusion. The vertebra and left lateral masses are fused. No foraminal stenosis. C4-5: Disc space narrowing. Osteophytes protrude into both neural foramina without significant foraminal stenosis. C5-6: Disc space narrowing with prominent degenerative changes of the vertebral endplates.  Broad-based disc osteophyte complex slightly narrows the AP dimension of the spinal canal. Uncinate spurs extend into both neural foramina with moderate bilateral foraminal stenosis. C6-7: Chronic disc space narrowing. Broad-based disc osteophyte complex with moderate left foraminal stenosis. C7-T1: Moderate right facet arthritis. Otherwise negative. Upper chest: Emphysematous changes at the right lung apex. Other: None IMPRESSION: 1. No acute intracranial abnormality. 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 2. No acute abnormality of the cervical spine. Multilevel degenerative disc and joint disease. Electronically Signed   By: Lorriane Shire M.D.   On: 11/25/2019 15:28   MR Brain Wo Contrast (neuro protocol)  Result Date: 11/25/2019 CLINICAL DATA:  Encephalopathy and aphasia. Found down. EXAM: MRI HEAD WITHOUT CONTRAST TECHNIQUE: Multiplanar, multiecho pulse sequences of the brain and surrounding structures were obtained without intravenous contrast. COMPARISON:  Brain MRI 12/02/2016 FINDINGS: BRAIN: There is no acute infarct, acute hemorrhage or extra-axial collection. Mild white matter hyperintensity, most commonly due to chronic ischemic microangiopathy, though not unexpected for age. There is generalized atrophy without lobar predilection. The midline structures are normal. VASCULAR: The major intracranial arterial and venous sinus flow voids are normal. Small amount of hemosiderin deposition in the right central sulcus. SKULL AND UPPER CERVICAL SPINE: Calvarial bone marrow signal is normal. There is no skull base mass. The visualized upper cervical spine and soft tissues are normal. SINUSES/ORBITS: There are no fluid levels or advanced mucosal thickening. The mastoid air cells and middle ear cavities are free of fluid. The orbits are normal. IMPRESSION: 1. No acute intracranial abnormality. 2. Generalized atrophy and mild chronic small vessel disease. Electronically Signed   By: Ulyses Jarred M.D.    On: 11/25/2019 23:37   US Carotid Bilateral (at [facility] and [facility] only)  Result Date: 11/26/2019 CLINICAL DATA:  TIA. History of CABG, hypertension, hyperlipidemia and diabetes. Former smoker. EXAM: BILATERAL CAROTID DUPLEX ULTRASOUND TECHNIQUE: Pearline Cables scale imaging, color Doppler and duplex ultrasound were performed of bilateral carotid and vertebral arteries in the neck. COMPARISON:  Carotid Doppler ultrasound-12/02/2016 FINDINGS: Examination is degraded secondary to patient's inability to tolerate positioning necessary for the ultrasound. Criteria: Quantification of carotid stenosis is based on velocity parameters that correlate the residual internal carotid diameter with NASCET-based stenosis levels, using the diameter of the distal internal carotid lumen as the denominator for stenosis measurement. The following velocity measurements were obtained: RIGHT ICA: 71/20 cm/sec CCA: 23/5 cm/sec SYSTOLIC ICA/CCA RATIO:  1.5 ECA: 306 cm/sec LEFT ICA: 78/20 cm/sec CCA: 57/32 cm/sec SYSTOLIC ICA/CCA RATIO:  0.8 ECA: 105 cm/sec RIGHT CAROTID ARTERY: There is a moderate amount of eccentric mixed echogenic plaque within the right carotid bulb (image 24), extending to involve the origin and proximal aspects of the right internal carotid artery (image 33, morphologically similar to the 11/2016 examination, and again not resulting in elevated peak systolic velocities within the interrogated course of the right internal carotid artery to suggest a hemodynamically significant stenosis. RIGHT VERTEBRAL ARTERY:  Antegrade Flow LEFT CAROTID ARTERY: There is a minimal amount of intimal  thickening/atherosclerotic plaque involving the mid and distal aspects of the left common carotid artery as well as the left carotid bulb, morphologically similar to the 11/2016 examination, and again not resulting in elevated peak systolic velocities within the interrogated course of the left internal carotid artery to suggest a hemodynamically  significant stenosis. LEFT VERTEBRAL ARTERY:  Antegrade flow IMPRESSION: Minimal to moderate amount of bilateral atherosclerotic plaque, right greater than left, morphologically similar to the 11/2016 examination, and again not resulting in elevated peak systolic velocities within either internal carotid artery to suggest a hemodynamically significant stenosis. Electronically Signed   By: Sandi Mariscal M.D.   On: 11/26/2019 16:15   DG Chest Port 1 View  Result Date: 11/29/2019 CLINICAL DATA:  Nasogastric tube insertion EXAM: PORTABLE CHEST 1 VIEW COMPARISON:  11/27/2019 FINDINGS: Interval placement of nasogastric tube with side hole at the level of the GE junction and distal tip within the proximal stomach. The heart size and mediastinal contours are stable. Probable small bilateral pleural effusions. IMPRESSION: 1. Interval placement of nasogastric tube with side hole at the level of the GE junction, recommend advancement approximately 5 cm. 2. Probable small bilateral pleural effusions. Electronically Signed   By: Davina Poke D.O.   On: 11/29/2019 19:01   DG Chest Port 1 View  Result Date: 11/27/2019 CLINICAL DATA:  Tachypnea EXAM: PORTABLE CHEST 1 VIEW COMPARISON:  Chest radiograph 11/26/2019 FINDINGS: Mild cardiomegaly, unchanged. Aortic atherosclerosis. Bilateral interstitial prominence is similar to prior examination. The left lateral costophrenic angle is incompletely included in the field of view, but there is suggestion of a small left pleural effusion. No evidence of pneumothorax. No acute bony abnormality. Overlying cardiac monitoring leads. IMPRESSION: Bilateral interstitial prominence may reflect interstitial edema. Atypical/viral pneumonia cannot be excluded. A small left pleural effusion is questioned. Stable mild cardiomegaly.  Aortic atherosclerosis. Electronically Signed   By: Kellie Simmering DO   On: 11/27/2019 09:28   DG Chest Port 1 View  Result Date: 11/26/2019 CLINICAL DATA:   Altered mental status. EXAM: PORTABLE CHEST 1 VIEW COMPARISON:  11/25/2019 FINDINGS: Mild cardiac enlargement. No pleural effusion or edema. No airspace opacities identified. The visualized osseous structures are unremarkable. IMPRESSION: 1. No acute cardiopulmonary abnormalities. 2. Mild cardiac enlargement. Electronically Signed   By: Kerby Moors M.D.   On: 11/26/2019 10:41   DG Chest Portable 1 View  Result Date: 11/25/2019 CLINICAL DATA:  Altered mental status, history lymphoma, coronary artery disease, diabetes mellitus, CHF, hypertension, former smoker EXAM: PORTABLE CHEST 1 VIEW COMPARISON:  Portable exam 1509 hours compared 07/14/2019 FINDINGS: Upper normal heart size. Mediastinal contours and pulmonary vascularity normal. Atherosclerotic calcification aorta. Lungs clear. No infiltrate, pleural effusion or pneumothorax. No acute osseous findings. IMPRESSION: No acute abnormalities. Aortic Atherosclerosis (ICD10-I70.0). Electronically Signed   By: Lavonia Dana M.D.   On: 11/25/2019 15:26   ECHOCARDIOGRAM COMPLETE  Result Date: 11/27/2019   ECHOCARDIOGRAM REPORT   Patient Name:   ORAN DILLENBURG Date of Exam: 11/26/2019 Medical Rec #:  [id number]         Height:       70.0 in Accession #:    [id number]        Weight:       200.6 lb Date of Birth:  09-11-1935         BSA:          2.09 m Patient Age:    15 years          BP:  141/87 mmHg Patient Gender: M                 HR:           112 bpm. Exam Location:  [facility] Procedure: 2D Echo, Cardiac Doppler and Color Doppler Indications:     TIA 435.9  History:         Patient has prior history of Echocardiogram examinations. CAD;                  Risk Factors:Hypertension.  Sonographer:     Alyse Low Roar Referring Phys:  [place] Diagnosing Phys: Nelva Bush MD  Sonographer Comments: Technically difficult study due to poor echo windows. Image acquisition challenging due to patient behavioral factors. IMPRESSIONS  1. Left  ventricular ejection fraction, by visual estimation, is 50 to 55%. The left ventricle has low normal function. There is mildly increased left ventricular hypertrophy.  2. Indeterminate diastolic filling due to E-A fusion.  3. Global right ventricle has low normal systolic function.The right ventricular size is normal. No increase in right ventricular wall thickness.  4. Pulmonary artery pressure is likely upper normal to mildly elevated (PASP 30 mmHg plus central venous pressure).  5. Left atrial size was normal.  6. Right atrial size was normal.  7. The mitral valve is grossly normal. No evidence of mitral valve regurgitation. No evidence of mitral stenosis.  8. The tricuspid valve is normal in structure.  9. Aortic valve regurgitation is mild to moderate. 10. The aortic valve is tricuspid. Aortic valve regurgitation is mild to moderate. Mild aortic valve stenosis. 11. Aortic regurgitation appears mild by color Doppler, though spectral Doppler envelope suggests more significant regurgitation. 12. The pulmonic valve was not well visualized. Pulmonic valve regurgitation is not visualized. 13. The interatrial septum was not well visualized. FINDINGS  Left Ventricle: Left ventricular ejection fraction, by visual estimation, is 50 to 55%. The left ventricle has low normal function. The left ventricle is not well visualized. The left ventricular internal cavity size was the left ventricle is normal in size. There is mildly increased left ventricular hypertrophy. Indeterminate diastolic filling due to E-A fusion. Right Ventricle: The right ventricular size is normal. No increase in right ventricular wall thickness. Global RV systolic function is has low normal systolic function. Pulmonary artery pressure is likely upper normal to mildly elevated (PASP 30 mmHg plus central venous pressure). Left Atrium: Left atrial size was normal in size. Right Atrium: Right atrial size was normal in size Pericardium: There is no evidence  of pericardial effusion. Mitral Valve: The mitral valve is grossly normal. No evidence of mitral valve regurgitation. No evidence of mitral valve stenosis by observation. Tricuspid Valve: The tricuspid valve is normal in structure. Tricuspid valve regurgitation mild-moderate. Aortic Valve: The aortic valve is tricuspid. . There is moderate thickening and mild calcification of the aortic valve. Aortic valve regurgitation is mild to moderate. Aortic regurgitation PHT measures 209 msec. Mild aortic stenosis is present. There is moderate thickening of the aortic valve. There is mild calcification of the aortic valve. Aortic valve mean gradient measures 17.5 mmHg. Aortic valve peak gradient measures 30.6 mmHg. Aortic valve area, by VTI measures 1.32 cm. Aortic regurgitation appears mild by color Doppler, though spectral Doppler envelope suggests more significant regurgitation. Pulmonic Valve: The pulmonic valve was not well visualized. Pulmonic valve regurgitation is not visualized. Pulmonic regurgitation is not visualized. No evidence of pulmonic stenosis. Aorta: The aortic root is normal in size and structure. Pulmonary  Artery: The pulmonary artery is not well seen. Venous: The inferior vena cava was not well visualized. IAS/Shunts: The interatrial septum was not well visualized.  LEFT VENTRICLE PLAX 2D LVIDd:         4.89 cm  Diastology LVIDs:         3.96 cm  LV e' lateral:   5.00 cm/s LV PW:         1.24 cm  LV E/e' lateral: 25.2 LV IVS:        1.34 cm  LV e' medial:    4.90 cm/s LVOT diam:     2.10 cm  LV E/e' medial:  25.7 LV SV:         44 ml LV SV Index:   20.53 LVOT Area:     3.46 cm  RIGHT VENTRICLE RV S prime:     16.80 cm/s LEFT ATRIUM           Index       RIGHT ATRIUM           Index LA diam:      3.20 cm 1.53 cm/m  RA Area:     10.10 cm LA Vol (A2C): 60.6 ml 28.99 ml/m RA Volume:   18.90 ml  9.04 ml/m LA Vol (A4C): 15.5 ml 7.42 ml/m  AORTIC VALVE                    PULMONIC VALVE AV Area (Vmax):     1.43 cm     PV Vmax:        0.96 m/s AV Area (Vmean):   1.35 cm     PV Peak grad:   3.7 mmHg AV Area (VTI):     1.32 cm     RVOT Peak grad: 2 mmHg AV Vmax:           276.50 cm/s AV Vmean:          196.500 cm/s AV VTI:            0.421 m AV Peak Grad:      30.6 mmHg AV Mean Grad:      17.5 mmHg LVOT Vmax:         114.00 cm/s LVOT Vmean:        76.700 cm/s LVOT VTI:          0.160 m LVOT/AV VTI ratio: 0.38 AI PHT:            209 msec  AORTA Ao Root diam: 2.70 cm MITRAL VALVE                        TRICUSPID VALVE MV Area (PHT): 6.65 cm             TR Peak grad:   30.5 mmHg MV PHT:        33.06 msec           TR Vmax:        276.00 cm/s MV Decel Time: 114 msec MV E velocity: 126.00 cm/s 103 cm/s SHUNTS                                     Systemic VTI:  0.16 m  Systemic Diam: 2.10 cm  Nelva Bush MD Electronically signed by Nelva Bush MD Signature Date/Time: 11/27/2019/7:29:06 AM    Final        Subjective: Pt is talkative today. Told me was trying to go to bathroom but laying in bed and made a mess. Was sorry for soiling the bed. No other complaints.  Discharge Exam: Vitals:   12/05/19 0533 12/05/19 0739  BP: 138/63 132/67  Pulse: 98 95  Resp: 14 17  Temp: 98.3 F (36.8 C) 98.7 F (37.1 C)  SpO2: 97% 98%   Vitals:   12/05/19 0102 12/05/19 0402 12/05/19 0533 12/05/19 0739  BP: (!) 157/70 (!) 161/69 138/63 132/67  Pulse: 99 91 98 95  Resp: 16 14 14 17   Temp: 98.8 F (37.1 C) (!) 97.5 F (36.4 C) 98.3 F (36.8 C) 98.7 F (37.1 C)  TempSrc: Oral Oral Oral Oral  SpO2: 99% 98% 97% 98%  Weight:      Height:        General: Pt is alert, awake, not in acute distress. Hard of hearing, but responds to my questions Cardiovascular: RRR, S1/S2 +, no rubs, no gallops Respiratory: CTA bilaterally, no wheezing, no rhonchi anteriolry Abdominal: Soft, NT, midly distended, bowel sounds + Extremities: no edema, no cyanosis    The results of  significant diagnostics from this hospitalization (including imaging, microbiology, ancillary and laboratory) are listed below for reference.     Microbiology: Recent Results (from the past 240 hour(s))  Urine Culture     Status: Abnormal   Collection Time: 11/25/19  2:37 PM   Specimen: Urine, Random  Result Value Ref Range Status   Specimen Description   Final    URINE, RANDOM Performed at [facility], [address], [place], [place] [address]    Special Requests   Final    NONE Performed at [facility], [address] [place], [place] [address]    Culture (A)  Final    <10,000 COLONIES/mL INSIGNIFICANT GROWTH Performed at [facility], [place] [address], [place], [place] [address]    Report Status 11/27/2019 FINAL  Final  Respiratory Panel by RT PCR (Flu A&B, Covid) - Urine, Clean Catch     Status: None   Collection Time: 11/25/19  4:22 PM   Specimen: Urine, Clean Catch  Result Value Ref Range Status   SARS Coronavirus 2 by RT PCR NEGATIVE NEGATIVE Final    Comment: (NOTE) SARS-CoV-2 target nucleic acids are NOT DETECTED. The SARS-CoV-2 RNA is generally detectable in upper respiratoy specimens during the acute phase of infection. The lowest concentration of SARS-CoV-2 viral copies this assay can detect is 131 copies/mL. A negative result does not preclude SARS-Cov-2 infection and should not be used as the sole basis for treatment or other patient management decisions. A negative result may occur with  improper specimen collection/handling, submission of specimen other than nasopharyngeal swab, presence of viral mutation(s) within the areas targeted by this assay, and inadequate number of viral copies (<131 copies/mL). A negative result must be combined with clinical observations, patient history, and epidemiological information. The expected result is Negative. Fact Sheet for Patients:  PinkCheek.be Fact Sheet for  Healthcare Providers:  GravelBags.it This test is not yet ap proved or cleared by the [place] FDA and  has been authorized for detection and/or diagnosis of SARS-CoV-2 by FDA under an Emergency Use Authorization (EUA). This EUA will remain  in effect (meaning this test can be used) for the  duration of the COVID-19 declaration under Section 564(b)(1) of the Act, 21 U.S.C. section 360bbb-3(b)(1), unless the authorization is terminated or revoked sooner.    Influenza A by PCR NEGATIVE NEGATIVE Final   Influenza B by PCR NEGATIVE NEGATIVE Final    Comment: (NOTE) The Xpert Xpress SARS-CoV-2/FLU/RSV assay is intended as an aid in  the diagnosis of influenza from Nasopharyngeal swab specimens and  should not be used as a sole basis for treatment. Nasal washings and  aspirates are unacceptable for Xpert Xpress SARS-CoV-2/FLU/RSV  testing. Fact Sheet for Patients: PinkCheek.be Fact Sheet for Healthcare Providers: GravelBags.it This test is not yet approved or cleared by the [place] FDA and  has been authorized for detection and/or diagnosis of SARS-CoV-2 by  FDA under an Emergency Use Authorization (EUA). This EUA will remain  in effect (meaning this test can be used) for the duration of the  Covid-19 declaration under Section 564(b)(1) of the Act, 21  U.S.C. section 360bbb-3(b)(1), unless the authorization is  terminated or revoked. Performed at [facility], [address] [place], [place] [address]   CULTURE, BLOOD (ROUTINE X 2) w Reflex to ID Panel     Status: None   Collection Time: 11/26/19 10:51 AM   Specimen: BLOOD  Result Value Ref Range Status   Specimen Description BLOOD RIGHT ANTECUBITAL  Final   Special Requests   Final    BOTTLES DRAWN AEROBIC AND ANAEROBIC Blood Culture adequate volume   Culture   Final    NO GROWTH 5 DAYS Performed at [facility], [address] [place], [place] [address]    Report Status 12/01/2019 FINAL  Final  CULTURE, BLOOD (ROUTINE X 2) w Reflex to ID Panel     Status: None   Collection Time: 11/26/19 10:51 AM   Specimen: BLOOD  Result Value Ref Range Status   Specimen Description BLOOD BLOOD LEFT FOREARM  Final   Special Requests   Final    BOTTLES DRAWN AEROBIC AND ANAEROBIC Blood Culture adequate volume   Culture   Final    NO GROWTH 5 DAYS Performed at [facility], [address], [place], [place] [address]    Report Status 12/01/2019 FINAL  Final  Urine Culture     Status: None   Collection Time: 11/27/19  3:53 PM   Specimen: Urine, Random  Result Value Ref Range Status   Specimen Description   Final    URINE, RANDOM Performed at [facility], [address] [place], [place] [address]    Special Requests   Final    NONE Performed at [facility], [address] [place], [place] [address]    Culture   Final    NO GROWTH Performed at [facility], [place] [address] [place], [place] [address]    Report Status 11/28/2019 FINAL  Final  MRSA PCR Screening     Status: None   Collection Time: 11/27/19  3:55 PM   Specimen: Nasopharyngeal  Result Value Ref Range Status   MRSA by PCR NEGATIVE NEGATIVE Final    Comment:        The GeneXpert MRSA Assay (FDA approved for NASAL specimens only), is one component of a comprehensive MRSA colonization surveillance program. It is not intended to diagnose MRSA infection nor to guide or monitor treatment for MRSA infections. Performed at [facility], [place], [place] [address]   SARS CORONAVIRUS 2 (TAT 6-24 HRS) Nasopharyngeal Nasopharyngeal Swab  Status: None   Collection Time: 12/04/19  5:02 PM   Specimen: Nasopharyngeal Swab  Result Value Ref Range Status   SARS Coronavirus 2 NEGATIVE NEGATIVE Final    Comment: (NOTE) SARS-CoV-2 target nucleic acids are NOT DETECTED. The SARS-CoV-2  RNA is generally detectable in upper and lower respiratory specimens during the acute phase of infection. Negative results do not preclude SARS-CoV-2 infection, do not rule out co-infections with other pathogens, and should not be used as the sole basis for treatment or other patient management decisions. Negative results must be combined with clinical observations, patient history, and epidemiological information. The expected result is Negative. Fact Sheet for Patients: SugarRoll.be Fact Sheet for Healthcare Providers: https://www.woods-mathews.com/ This test is not yet approved or cleared by the [place] FDA and  has been authorized for detection and/or diagnosis of SARS-CoV-2 by FDA under an Emergency Use Authorization (EUA). This EUA will remain  in effect (meaning this test can be used) for the duration of the COVID-19 declaration under Section 56 4(b)(1) of the Act, 21 U.S.C. section 360bbb-3(b)(1), unless the authorization is terminated or revoked sooner. Performed at [facility], [place] [address], [place], [place] [address]      Labs: BNP (last 3 results) Recent Labs    02/09/19 0425 05/22/19 1118 07/14/19 0756  BNP 126.0* 206.0* 779.3*   Basic Metabolic Panel: Recent Labs  Lab 11/29/19 0315 11/30/19 0627 12/01/19 0332 12/02/19 0759 12/03/19 0347 12/04/19 0333 12/05/19 0539  NA 143 144 143 147* 148* 149* 143  K 3.7 3.8 3.5 3.7 4.3 4.0 4.3  CL 104 106 106 109 109 113* 106  CO2 27 25 26 26 28 27 28   GLUCOSE 153* 217* 453* 398* 404* 138* 158*  BUN 33* 39* 39* 41* 45* 46* 44*  CREATININE 1.98* 1.70* 2.01* 1.98* 2.11* 1.78* 1.79*  CALCIUM 7.9* 8.2* 8.3* 8.5* 8.7* 8.7* 8.2*  MG 2.4 2.7* 2.6* 2.6*  --   --   --   PHOS 4.1 3.8 3.3 3.5  --   --   --    Liver Function Tests: No results for input(s): AST, ALT, ALKPHOS, BILITOT, PROT, ALBUMIN in the last 168 hours. Recent Labs  Lab 12/01/19 0332  LIPASE 44    No results for input(s): AMMONIA in the last 168 hours. CBC: Recent Labs  Lab 12/01/19 0332 12/02/19 0759 12/03/19 0347 12/04/19 0333 12/05/19 0539  WBC 8.8 11.3* 9.6 8.0 6.3  HGB 11.3* 11.0* 11.0* 10.5* 9.8*  HCT 37.6* 34.4* 34.8* 35.4* 32.7*  MCV 89.7 85.8 87.2 91.9 91.3  PLT 236 239 226 238 229   Cardiac Enzymes: No results for input(s): CKTOTAL, CKMB, CKMBINDEX, TROPONINI in the last 168 hours. BNP: Invalid input(s): POCBNP CBG: Recent Labs  Lab 12/04/19 2102 12/05/19 0115 12/05/19 0357 12/05/19 0738 12/05/19 1147  GLUCAP 291* 130* 148* 150* 170*   D-Dimer No results for input(s): DDIMER in the last 72 hours. Hgb A1c No results for input(s): HGBA1C in the last 72 hours. Lipid Profile No results for input(s): CHOL, HDL, LDLCALC, TRIG, CHOLHDL, LDLDIRECT in the last 72 hours. Thyroid function studies No results for input(s): TSH, T4TOTAL, T3FREE, THYROIDAB in the last 72 hours.  Invalid input(s): FREET3 Anemia work up No results for input(s): VITAMINB12, FOLATE, FERRITIN, TIBC, IRON, RETICCTPCT in the last 72 hours. Urinalysis    Component Value Date/Time   COLORURINE STRAW (A) 11/25/2019 1437   APPEARANCEUR CLEAR (A) 11/25/2019 1437   LABSPEC 1.025 11/25/2019 1437   PHURINE  6.0 11/25/2019 1437   GLUCOSEU >=500 (A) 11/25/2019 1437   HGBUR NEGATIVE 11/25/2019 1437   BILIRUBINUR NEGATIVE 11/25/2019 1437   KETONESUR NEGATIVE 11/25/2019 1437   PROTEINUR 100 (A) 11/25/2019 1437   NITRITE NEGATIVE 11/25/2019 1437   LEUKOCYTESUR NEGATIVE 11/25/2019 1437   Sepsis Labs Invalid input(s): PROCALCITONIN,  WBC,  LACTICIDVEN Microbiology Recent Results (from the past 240 hour(s))  Urine Culture     Status: Abnormal   Collection Time: 11/25/19  2:37 PM   Specimen: Urine, Random  Result Value Ref Range Status   Specimen Description   Final    URINE, RANDOM Performed at [facility], [address] [place], [place] [address]    Special Requests    Final    NONE Performed at [facility], [address] [place], [place] [address]    Culture (A)  Final    <10,000 COLONIES/mL INSIGNIFICANT GROWTH Performed at [facility], [place] [address] [place], [place] [address]    Report Status 11/27/2019 FINAL  Final  Respiratory Panel by RT PCR (Flu A&B, Covid) - Urine, Clean Catch     Status: None   Collection Time: 11/25/19  4:22 PM   Specimen: Urine, Clean Catch  Result Value Ref Range Status   SARS Coronavirus 2 by RT PCR NEGATIVE NEGATIVE Final    Comment: (NOTE) SARS-CoV-2 target nucleic acids are NOT DETECTED. The SARS-CoV-2 RNA is generally detectable in upper respiratoy specimens during the acute phase of infection. The lowest concentration of SARS-CoV-2 viral copies this assay can detect is 131 copies/mL. A negative result does not preclude SARS-Cov-2 infection and should not be used as the sole basis for treatment or other patient management decisions. A negative result may occur with  improper specimen collection/handling, submission of specimen other than nasopharyngeal swab, presence of viral mutation(s) within the areas targeted by this assay, and inadequate number of viral copies (<131 copies/mL). A negative result must be combined with clinical observations, patient history, and epidemiological information. The expected result is Negative. Fact Sheet for Patients:  PinkCheek.be Fact Sheet for Healthcare Providers:  GravelBags.it This test is not yet ap proved or cleared by the [place] FDA and  has been authorized for detection and/or diagnosis of SARS-CoV-2 by FDA under an Emergency Use Authorization (EUA). This EUA will remain  in effect (meaning this test can be used) for the duration of the COVID-19 declaration under Section 564(b)(1) of the Act, 21 U.S.C. section 360bbb-3(b)(1), unless the authorization is terminated or revoked  sooner.    Influenza A by PCR NEGATIVE NEGATIVE Final   Influenza B by PCR NEGATIVE NEGATIVE Final    Comment: (NOTE) The Xpert Xpress SARS-CoV-2/FLU/RSV assay is intended as an aid in  the diagnosis of influenza from Nasopharyngeal swab specimens and  should not be used as a sole basis for treatment. Nasal washings and  aspirates are unacceptable for Xpert Xpress SARS-CoV-2/FLU/RSV  testing. Fact Sheet for Patients: PinkCheek.be Fact Sheet for Healthcare Providers: GravelBags.it This test is not yet approved or cleared by the [place] FDA and  has been authorized for detection and/or diagnosis of SARS-CoV-2 by  FDA under an Emergency Use Authorization (EUA). This EUA will remain  in effect (meaning this test can be used) for the duration of the  Covid-19 declaration under Section 564(b)(1) of the Act, 21  U.S.C. section 360bbb-3(b)(1), unless the authorization is  terminated or revoked. Performed at [facility], [address], [place], [place] [address]  CULTURE, BLOOD (ROUTINE X 2) w Reflex to ID Panel     Status: None   Collection Time: 11/26/19 10:51 AM   Specimen: BLOOD  Result Value Ref Range Status   Specimen Description BLOOD RIGHT ANTECUBITAL  Final   Special Requests   Final    BOTTLES DRAWN AEROBIC AND ANAEROBIC Blood Culture adequate volume   Culture   Final    NO GROWTH 5 DAYS Performed at [facility], [address] [place], [place] [address]    Report Status 12/01/2019 FINAL  Final  CULTURE, BLOOD (ROUTINE X 2) w Reflex to ID Panel     Status: None   Collection Time: 11/26/19 10:51 AM   Specimen: BLOOD  Result Value Ref Range Status   Specimen Description BLOOD BLOOD LEFT FOREARM  Final   Special Requests   Final    BOTTLES DRAWN AEROBIC AND ANAEROBIC Blood Culture adequate volume   Culture   Final    NO GROWTH 5 DAYS Performed at [facility], [address] [place], [place] [address]    Report Status 12/01/2019 FINAL  Final  Urine Culture     Status: None   Collection Time: 11/27/19  3:53 PM   Specimen: Urine, Random  Result Value Ref Range Status   Specimen Description   Final    URINE, RANDOM Performed at [facility], [address] [place], [place] [address]    Special Requests   Final    NONE Performed at [facility], [address], [place], [place] [address]    Culture   Final    NO GROWTH Performed at [facility], [place] [address], [place], [place] [address]    Report Status 11/28/2019 FINAL  Final  MRSA PCR Screening     Status: None   Collection Time: 11/27/19  3:55 PM   Specimen: Nasopharyngeal  Result Value Ref Range Status   MRSA by PCR NEGATIVE NEGATIVE Final    Comment:        The GeneXpert MRSA Assay (FDA approved for NASAL specimens only), is one component of a comprehensive MRSA colonization surveillance program. It is not intended to diagnose MRSA infection nor to guide or monitor treatment for MRSA infections. Performed at [facility], [place], [place] [address]   SARS CORONAVIRUS 2 (TAT 6-24 HRS) Nasopharyngeal Nasopharyngeal Swab     Status: None   Collection Time: 12/04/19  5:02 PM   Specimen: Nasopharyngeal Swab  Result Value Ref Range Status   SARS Coronavirus 2 NEGATIVE NEGATIVE Final    Comment: (NOTE) SARS-CoV-2 target nucleic acids are NOT DETECTED. The SARS-CoV-2 RNA is generally detectable in upper and lower respiratory specimens during the acute phase of infection. Negative results do not preclude SARS-CoV-2 infection, do not rule out co-infections with other pathogens, and should not be used as the sole basis for treatment or other patient management decisions. Negative results must be combined with clinical observations, patient history, and epidemiological information. The expected result is Negative. Fact Sheet for  Patients: SugarRoll.be Fact Sheet for Healthcare Providers: https://www.woods-mathews.com/ This test is not yet approved or cleared by the [place] FDA and  has been authorized for detection and/or diagnosis of SARS-CoV-2 by FDA under an Emergency Use Authorization (EUA). This EUA will remain  in effect (meaning this test can be used) for the duration of the COVID-19 declaration under Section 56 4(b)(1) of the Act, 21 U.S.C. section 360bbb-3(b)(1), unless the authorization  is terminated or revoked sooner. Performed at [facility], [place] [address], [place], [place] [address]      Time coordinating discharge: Over 30 minutes  SIGNED:   Nolberto Hanlon, MD  [facility] 12/05/2019, 1:51 PM Pager   If 7PM-7AM, please contact night-coverage [email or web address] Password [email or web address]

## 2019-12-05 NOTE — TOC Transition Note (Signed)
Transition of Care [facility]) - CM/SW Discharge Note   Patient Details  Name: Brett [name] MRN: [id number] Date of Birth: 30-Jul-1935  Transition [facility]) CM/SW Contact:  Candie Chroman, LCSW Phone Number: 12/05/2019, 2:32 PM   Clinical Narrative: Patient has orders to discharge to [facility] today. HCPOA is aware. RN will call report to [phone] prior to setting up EMS transport. No further concerns. CSW signing off.    Final next level of care: [place] Barriers to Discharge: Barriers Resolved   Patient Goals and CMS Choice        Discharge Placement PASRR number recieved: 11/28/19            Patient chooses bed at: [facility] Patient to be transferred to facility by: EMS Name of family member notified: Claudette Head Patient and family notified of of transfer: 12/05/19  Discharge Plan and Services                                     Social Determinants of Health (SDOH) Interventions     Readmission Risk Interventions Readmission Risk Prevention Plan 12/05/2019  PCP or Specialist appointment within 3-5 days of discharge Complete  HRI or [place] (No Data)  SW Recovery Care/Counseling Consult Complete  Palliative Care Screening Not Applicable  Skilled Nursing Facility Complete  Some recent data might be hidden

## 2019-12-27 ENCOUNTER — Other Ambulatory Visit: Payer: Self-pay | Admitting: Oncology

## 2019-12-27 DIAGNOSIS — R911 Solitary pulmonary nodule: Secondary | ICD-10-CM

## 2019-12-27 NOTE — Progress Notes (Signed)
  Pulmonary Nodule Clinic Telephone Note  Received referral from pcp Dr. [name].   Patient was evaluated for shortness of breath on 01/21/2019 and had subsequent CT angio chest to rule out a pulmonary embolus.  Imaging revealed no evidence of a PE but possible pneumonia.  Incidental findings were a 5 mm nodule in the right minor fissure and lack of nodularity in the anterior right middle lobe.  Follow-up recommended in approximately 12 months if he was considered high risk.  I personally reviewed his imaging including a CT angio from 12/05/2017 which did show a 6 mm subpleural nodular density in the right lower lobe.  It was recommended he have additional follow-up in 3 to 6 months to assess stability.  Patient was recently admitted to the hospital from 11/25/2019 through 12/06/2019 for altered mental status. Patient with past medical history as below including CAD, CHF, diabetes, COPD with frequent admissions for pneumonia. Per report, the patient was found down in his house, confused, making nonsensical speech by a neighbor, who heard him banging on the wall.    Patient was discharged to [facility] d/t persistent confusion.  Neurology was consulted and thought altered mental status was likely multifactorial and related to metabolic issues given he was improving slowly.  Speech therapy recommended dysphagia diet.  I recommend follow-up with noncontrasted CT scan of the chest in the next 1 to 2 months given recent hospitalization.  Patient is a former smoker.  Unclear of quit date.  High risk factors include: History of heavy smoking, exposure to asbestos, radium or uranium, personal family history of lung cancer, older age, sex (females greater than males), race (black and native [place] greater than weight), marginal speculation, upper lobe location, multiplicity (less than 5 nodules increases risk for malignancy) and emphysema and/or pulmonary fibrosis.   This recommendation follows the consensus statement:  Guidelines for Management of Incidental Pulmonary Nodules Detected on CT Images: From the Fleischner Society 2017; Radiology 2017; 284:228-243.    Plan: Ordered CT chest without contrast in next 1 to 2 months given recent hospitalization. Scheduling message sent to [facility]. [facility] will contact patient with scheduled CT scan. I will see the patient the next day to review CT chest without contrast virtually.  Faythe Casa, NP 12/27/2019 1:33 PM

## 2020-01-11 ENCOUNTER — Other Ambulatory Visit: Payer: Self-pay | Admitting: Oncology

## 2020-01-31 ENCOUNTER — Other Ambulatory Visit: Payer: Self-pay | Admitting: Oncology

## 2020-02-04 ENCOUNTER — Telehealth: Payer: Self-pay | Admitting: *Deleted

## 2020-02-04 NOTE — Telephone Encounter (Signed)
Pt's caregiver, [name], made aware of referral to Lung Nodule Clinic from PCP. [name] is in agreement to have upcoming CT scan and follow up as recommended for the patient. [name] has been made aware of upcoming appts for follow up CT scan and follow up appt with Faythe Casa, NP in the Lung Nodule Clinic. [name] verbalized understanding. Nothing further needed at this time.

## 2020-03-04 ENCOUNTER — Ambulatory Visit: Payer: Medicare PPO | Attending: Oncology

## 2020-03-05 ENCOUNTER — Inpatient Hospital Stay: Payer: Medicare PPO | Admitting: Oncology

## 2020-03-05 NOTE — Progress Notes (Deleted)
Pulmonary Nodule Clinic Consult note [facility]  Telephone:([phone] [phone]  Patient Care Team: Perrin Maltese, MD as PCP - General (Internal Medicine) Alisa Graff, FNP as Nurse Practitioner (Family Medicine) Dionisio David, MD as Consulting Physician (Cardiology) Laverle Hobby, MD as Consulting Physician (Pulmonary Disease) Perrin Maltese, MD (Internal Medicine)   Name of the patient: Brett [name]  [id number]  [birth year]   Date of visit: 03/05/2020   Diagnosis- Lung Nodule  Virtual Visit via Telephone Note   I connected with@ on 03/05/20 at @CHLAPPTIME @ by {Blank single:19197::"video enabled telemedicine visit","telephone visit"} and verified that I am speaking with the correct person using two identifiers.   I discussed the limitations, risks, security and privacy concerns of performing an evaluation and management service by telemedicine and the availability of in-person appointments. I also discussed with the patient that there may be a patient responsible charge related to this service. The patient expressed understanding and agreed to proceed.   Other persons participating in the visit and their role in the encounter:   Patient's location: ***  Provider's location: ***  Chief complaint/ Reason for visit- Pulmonary Nodule Clinic Initial Visit  Past Medical History:  Patient is managed/referred by ***  Interval history-   Denies any neurologic complaints. Denies recent fevers or illnesses. Denies any easy bleeding or bruising. Reports good appetite and denies weight loss. Denies chest pain. Denies any nausea, vomiting, constipation, or diarrhea. Denies urinary complaints. Patient offers no further specific complaints today.   ECOG FS:{CHL ONC QZ:[id number]}  Review of systems- ROS   No Known Allergies   Past Medical History:  Diagnosis Date  . Anemia   . Aortic regurgitation   . B-cell lymphoma ([place]) 2009     DX AT [facility]  . Bronchitis   . CAD (coronary artery disease)   . Carotid stenosis   . CHF (congestive heart failure) ([place])   . Diabetes mellitus without complication ([place])   . Diabetes mellitus, type 2 ([place])   . Emphysema of lung ([place])   . Essential hypertension   . History of chemotherapy   . Hyperlipidemia   . Hypertension   . IDA (iron deficiency anemia)   . Leg edema   . Meralgia paraesthetica   . Mitral regurgitation   . PUD (peptic ulcer disease)   . PVD (peripheral vascular disease) ([place])   . Tobacco abuse      Past Surgical History:  Procedure Laterality Date  . CARDIAC CATHETERIZATION  08/15/2007  . CARDIAC CATHETERIZATION Right 07/13/2016   Procedure: Right/Left Heart Cath and Coronary Angiography;  Surgeon: Dionisio David, MD;  Location: [place] [facility];  Service: Cardiovascular;  Laterality: Right;  . COLONOSCOPY  03/2013  . ESOPHAGOGASTRODUODENOSCOPY  03/2013    Social History   Socioeconomic History  . Marital status: Single    Spouse name: Not on file  . Number of children: Not on file  . Years of education: Not on file  . Highest education level: Not on file  Occupational History  . Occupation: retired  Tobacco Use  . Smoking status: Former Smoker    Types: Cigarettes    Quit date: 08/30/2015    Years since quitting: 4.5  . Smokeless tobacco: Never Used  Substance and Sexual Activity  . Alcohol use: No  . Drug use: No  . Sexual activity: Not on file  Other Topics Concern  . Not on file  Social History Narrative   Lives in  a duplex apartment by himself.  Has a walker to ambulate   -His friends and neighbors check on him       Social Determinants of Health   Financial Resource Strain:   . Difficulty of Paying Living Expenses:   Food Insecurity:   . Worried About Charity fundraiser in the Last Year:   . Arboriculturist in the Last Year:   Transportation Needs:   . Film/video editor (Medical):   Marland Kitchen Lack of Transportation  (Non-Medical):   Physical Activity:   . Days of Exercise per Week:   . Minutes of Exercise per Session:   Stress:   . Feeling of Stress :   Social Connections:   . Frequency of Communication with Friends and Family:   . Frequency of Social Gatherings with Friends and Family:   . Attends Religious Services:   . Active Member of Clubs or Organizations:   . Attends Archivist Meetings:   Marland Kitchen Marital Status:   Intimate Partner Violence:   . Fear of Current or Ex-Partner:   . Emotionally Abused:   Marland Kitchen Physically Abused:   . Sexually Abused:     Family History  Problem Relation Age of Onset  . CAD Mother   . Rheum arthritis Neg Hx   . Osteoarthritis Neg Hx   . Asthma Neg Hx   . Diabetes Neg Hx   . Cancer Neg Hx      Current Outpatient Medications:  .  albuterol (PROVENTIL HFA;VENTOLIN HFA) 108 (90 Base) MCG/ACT inhaler, Inhale 2 puffs into the lungs every 6 (six) hours as needed for wheezing or shortness of breath., Disp: 1 Inhaler, Rfl: 0 .  amiodarone (PACERONE) 200 MG tablet, Take 1 tablet (200 mg total) by mouth daily., Disp: 60 tablet, Rfl: 0 .  amLODipine (NORVASC) 5 MG tablet, Take 5 mg by mouth daily. , Disp: , Rfl:  .  ammonium lactate (LAC-HYDRIN) 12 % lotion, Apply topically 2 (two) times daily., Disp: 400 g, Rfl: 0 .  clopidogrel (PLAVIX) 75 MG tablet, Take 75 mg by mouth daily. , Disp: , Rfl:  .  cyanocobalamin (,VITAMIN B-12,) 1000 MCG/ML injection, Inject 1,000 mcg into the muscle every 30 (thirty) days., Disp: , Rfl:  .  ferrous sulfate 325 (65 FE) MG tablet, Take 1 tablet (325 mg total) by mouth 2 (two) times daily with a meal., Disp: 30 tablet, Rfl: 3 .  gabapentin (NEURONTIN) 100 MG capsule, Take 1 capsule by mouth 2 (two) times daily. , Disp: , Rfl:  .  glimepiride (AMARYL) 2 MG tablet, Take 1 tablet (2 mg total) by mouth daily with breakfast. (Patient taking differently: Take 4 mg by mouth daily with breakfast. ), Disp: 30 tablet, Rfl: 0 .   ipratropium-albuterol (DUONEB) 0.5-2.5 (3) MG/3ML SOLN, J44.1  23ml nebulizer every six hours as needed for shortness of breath, Disp: 360 mL, Rfl: 0 .  isosorbide mononitrate (IMDUR) 30 MG 24 hr tablet, Take 30 mg by mouth daily., Disp: , Rfl:  .  metoprolol (TOPROL-XL) 50 MG 24 hr tablet, Take 1 tablet (50 mg total) by mouth 2 (two) times daily., Disp: , Rfl:  .  mometasone-formoterol (DULERA) 100-5 MCG/ACT AERO, Inhale 2 puffs into the lungs 2 (two) times daily., Disp: 13 g, Rfl: 0 .  pantoprazole (PROTONIX) 40 MG tablet, Take 40 mg by mouth daily., Disp: , Rfl:  .  rosuvastatin (CRESTOR) 40 MG tablet, Take 40 mg by mouth daily., Disp: ,  Rfl:  .  terbinafine (LAMISIL) 1 % cream, Apply twice a day to feet (okay to substitute any anti fungal cream that is the cheapest), Disp: 30 g, Rfl: 0 .  tiotropium (SPIRIVA HANDIHALER) 18 MCG inhalation capsule, Place 1 capsule (18 mcg total) into inhaler and inhale daily. (Patient not taking: Reported on 11/25/2019), Disp: 30 capsule, Rfl: 0 .  Vitamin D, Ergocalciferol, (DRISDOL) 1.25 MG (50000 UT) CAPS capsule, Take 50,000 Units by mouth once a week., Disp: , Rfl:  No current facility-administered medications for this visit.  Facility-Administered Medications Ordered in Other Visits:  .  sodium chloride flush (NS) 0.9 % injection 3 mL, 3 mL, Intravenous, Q12H, Jake Bathe, FNP  Physical exam: There were no vitals filed for this visit. Physical Exam   CMP Latest Ref Rng & Units 12/05/2019  Glucose 70 - 99 mg/dL 158(H)  BUN 8 - 23 mg/dL 44(H)  Creatinine 0.61 - 1.24 mg/dL 1.79(H)  Sodium 135 - 145 mmol/L 143  Potassium 3.5 - 5.1 mmol/L 4.3  Chloride 98 - 111 mmol/L 106  CO2 22 - 32 mmol/L 28  Calcium 8.9 - 10.3 mg/dL 8.2(L)  Total Protein 6.5 - 8.1 g/dL -  Total Bilirubin 0.3 - 1.2 mg/dL -  Alkaline Phos 38 - 126 U/L -  AST 15 - 41 U/L -  ALT 0 - 44 U/L -   CBC Latest Ref Rng & Units 12/05/2019  WBC 4.0 - 10.5 K/uL 6.3  Hemoglobin 13.0 -  17.0 g/dL 9.8(L)  Hematocrit 39.0 - 52.0 % 32.7(L)  Platelets 150 - 400 K/uL 229    No images are attached to the encounter.  No results found.   Assessment and plan- Patient is a 85 y.o. male who presents to pulmonary nodule clinic for follow-up of incidental lung nodules.  A telephone visit was conducted to review most recent CT scan results.    CT chest without contrast from today *** revealed    Calculating malignancy probability of a pulmonary nodule: Risk factors include: 1.  Age. 2.  Cancer history. 3.  Diameter of pulmonary nodule and mm 4.  Location 5.  Smoking history 6.  Spiculation present   Based on risk factors, this patient is *** risk for the development of lung cancer even in absence of a lung nodule.  I would recommend a ***-month follow-up with imaging to ensure stability given smoking history.  If imaging stable in 36-months, would recommend follow-up with annual low-dose CT screening. Will make referral if appropriate.    During our visit, we discussed pulmonary nodules are a common incidental finding and are often how lung cancer is discovered.  Lung cancer survival is directly related to the stage at diagnosis.  We discussed that nodules can vary in presentation from solitary pulmonary nodules to masses, 2 groundglass opacities and multiple nodules.  Pulmonary nodules in the majority of cases are benign but the probability of these becoming malignant cannot be undermined.  Early identification of malignant nodules could lead to early diagnosis and increased survival.   We discussed the probability of pulmonary nodules becoming malignant increase with age, pack years of tobacco use, size/characteristics of the nodule and location; with upper lobe involvement being most worrisome.   We discussed the goal of our clinic is to thoroughly evaluate each nodule, developed a comprehensive, individualized plan of care utilizing the most advanced technology and significantly  reduce the time from detection to treatment.  A dedicated pulmonary nodule clinic has proven to indeed  expedite the detection and treatment of lung cancer.   Patient education in fact sheet provided along with most recent CT scans.   Visit Diagnosis No diagnosis found.  Patient expressed understanding and was in agreement with this plan. He also understands that He can call clinic at any time with any questions, concerns, or complaints.   Greater than 50% was spent in counseling and coordination of care with this patient including but not limited to discussion of the relevant topics above (See A&P) including, but not limited to diagnosis and management of acute and chronic medical conditions.   Thank you for allowing me to participate in the care of this very pleasant patient.    Jacquelin Hawking, NP [place] at [facility] Cell - [phone] Pager- [phone] 03/05/2020 11:25 AM

## 2020-04-01 ENCOUNTER — Encounter: Payer: Self-pay | Admitting: *Deleted

## 2020-04-01 NOTE — Progress Notes (Signed)
Pt was a no show for CT scan on 03/04/20. Unable to get in touch with pt or caregiver to reschedule appts in the lung nodule clinic. Letter mailed to inform needs to call to reschedule appts. PCP made aware.

## 2020-04-04 ENCOUNTER — Inpatient Hospital Stay
Admission: EM | Admit: 2020-04-04 | Discharge: 2020-04-15 | DRG: 291 | Disposition: A | Discharge status: Home Health | Payer: Medicare PPO | Attending: Internal Medicine | Admitting: Internal Medicine

## 2020-04-04 ENCOUNTER — Emergency Department: Payer: Medicare PPO

## 2020-04-04 ENCOUNTER — Encounter: Payer: Self-pay | Admitting: Emergency Medicine

## 2020-04-04 ENCOUNTER — Other Ambulatory Visit: Payer: Self-pay

## 2020-04-04 DIAGNOSIS — N1832 Chronic kidney disease, stage 3b: Secondary | ICD-10-CM | POA: Diagnosis not present

## 2020-04-04 DIAGNOSIS — J9621 Acute and chronic respiratory failure with hypoxia: Secondary | ICD-10-CM | POA: Diagnosis present

## 2020-04-04 DIAGNOSIS — J90 Pleural effusion, not elsewhere classified: Secondary | ICD-10-CM | POA: Diagnosis not present

## 2020-04-04 DIAGNOSIS — E785 Hyperlipidemia, unspecified: Secondary | ICD-10-CM | POA: Diagnosis present

## 2020-04-04 DIAGNOSIS — Z8249 Family history of ischemic heart disease and other diseases of the circulatory system: Secondary | ICD-10-CM

## 2020-04-04 DIAGNOSIS — I2510 Atherosclerotic heart disease of native coronary artery without angina pectoris: Secondary | ICD-10-CM | POA: Diagnosis present

## 2020-04-04 DIAGNOSIS — J441 Chronic obstructive pulmonary disease with (acute) exacerbation: Secondary | ICD-10-CM | POA: Diagnosis present

## 2020-04-04 DIAGNOSIS — E1151 Type 2 diabetes mellitus with diabetic peripheral angiopathy without gangrene: Secondary | ICD-10-CM | POA: Diagnosis present

## 2020-04-04 DIAGNOSIS — H9190 Unspecified hearing loss, unspecified ear: Secondary | ICD-10-CM | POA: Diagnosis present

## 2020-04-04 DIAGNOSIS — I959 Hypotension, unspecified: Secondary | ICD-10-CM | POA: Diagnosis not present

## 2020-04-04 DIAGNOSIS — R0600 Dyspnea, unspecified: Secondary | ICD-10-CM

## 2020-04-04 DIAGNOSIS — E538 Deficiency of other specified B group vitamins: Secondary | ICD-10-CM | POA: Diagnosis present

## 2020-04-04 DIAGNOSIS — N400 Enlarged prostate without lower urinary tract symptoms: Secondary | ICD-10-CM | POA: Diagnosis present

## 2020-04-04 DIAGNOSIS — N170 Acute kidney failure with tubular necrosis: Secondary | ICD-10-CM | POA: Diagnosis not present

## 2020-04-04 DIAGNOSIS — J810 Acute pulmonary edema: Secondary | ICD-10-CM | POA: Diagnosis not present

## 2020-04-04 DIAGNOSIS — I482 Chronic atrial fibrillation, unspecified: Secondary | ICD-10-CM | POA: Diagnosis present

## 2020-04-04 DIAGNOSIS — I639 Cerebral infarction, unspecified: Secondary | ICD-10-CM | POA: Diagnosis present

## 2020-04-04 DIAGNOSIS — I6529 Occlusion and stenosis of unspecified carotid artery: Secondary | ICD-10-CM | POA: Diagnosis present

## 2020-04-04 DIAGNOSIS — I5033 Acute on chronic diastolic (congestive) heart failure: Secondary | ICD-10-CM | POA: Diagnosis present

## 2020-04-04 DIAGNOSIS — Z794 Long term (current) use of insulin: Secondary | ICD-10-CM | POA: Diagnosis not present

## 2020-04-04 DIAGNOSIS — I10 Essential (primary) hypertension: Secondary | ICD-10-CM | POA: Diagnosis present

## 2020-04-04 DIAGNOSIS — D631 Anemia in chronic kidney disease: Secondary | ICD-10-CM | POA: Diagnosis present

## 2020-04-04 DIAGNOSIS — I952 Hypotension due to drugs: Secondary | ICD-10-CM | POA: Diagnosis not present

## 2020-04-04 DIAGNOSIS — Z9981 Dependence on supplemental oxygen: Secondary | ICD-10-CM

## 2020-04-04 DIAGNOSIS — J9601 Acute respiratory failure with hypoxia: Secondary | ICD-10-CM | POA: Diagnosis not present

## 2020-04-04 DIAGNOSIS — J440 Chronic obstructive pulmonary disease with acute lower respiratory infection: Secondary | ICD-10-CM | POA: Diagnosis present

## 2020-04-04 DIAGNOSIS — C8510 Unspecified B-cell lymphoma, unspecified site: Secondary | ICD-10-CM | POA: Diagnosis present

## 2020-04-04 DIAGNOSIS — N179 Acute kidney failure, unspecified: Secondary | ICD-10-CM

## 2020-04-04 DIAGNOSIS — Z8711 Personal history of peptic ulcer disease: Secondary | ICD-10-CM

## 2020-04-04 DIAGNOSIS — I130 Hypertensive heart and chronic kidney disease with heart failure and stage 1 through stage 4 chronic kidney disease, or unspecified chronic kidney disease: Secondary | ICD-10-CM | POA: Diagnosis present

## 2020-04-04 DIAGNOSIS — Z20822 Contact with and (suspected) exposure to covid-19: Secondary | ICD-10-CM | POA: Diagnosis present

## 2020-04-04 DIAGNOSIS — K219 Gastro-esophageal reflux disease without esophagitis: Secondary | ICD-10-CM | POA: Diagnosis present

## 2020-04-04 DIAGNOSIS — E1122 Type 2 diabetes mellitus with diabetic chronic kidney disease: Secondary | ICD-10-CM | POA: Diagnosis present

## 2020-04-04 DIAGNOSIS — T502X5A Adverse effect of carbonic-anhydrase inhibitors, benzothiadiazides and other diuretics, initial encounter: Secondary | ICD-10-CM | POA: Diagnosis present

## 2020-04-04 DIAGNOSIS — Z515 Encounter for palliative care: Secondary | ICD-10-CM

## 2020-04-04 DIAGNOSIS — Z8673 Personal history of transient ischemic attack (TIA), and cerebral infarction without residual deficits: Secondary | ICD-10-CM | POA: Diagnosis not present

## 2020-04-04 DIAGNOSIS — Z7189 Other specified counseling: Secondary | ICD-10-CM | POA: Diagnosis not present

## 2020-04-04 DIAGNOSIS — E11649 Type 2 diabetes mellitus with hypoglycemia without coma: Secondary | ICD-10-CM | POA: Diagnosis present

## 2020-04-04 DIAGNOSIS — Z9221 Personal history of antineoplastic chemotherapy: Secondary | ICD-10-CM

## 2020-04-04 DIAGNOSIS — J189 Pneumonia, unspecified organism: Secondary | ICD-10-CM | POA: Diagnosis present

## 2020-04-04 DIAGNOSIS — N183 Chronic kidney disease, stage 3 unspecified: Secondary | ICD-10-CM | POA: Diagnosis present

## 2020-04-04 DIAGNOSIS — I509 Heart failure, unspecified: Secondary | ICD-10-CM

## 2020-04-04 DIAGNOSIS — D509 Iron deficiency anemia, unspecified: Secondary | ICD-10-CM | POA: Diagnosis present

## 2020-04-04 DIAGNOSIS — Z87891 Personal history of nicotine dependence: Secondary | ICD-10-CM

## 2020-04-04 DIAGNOSIS — Z66 Do not resuscitate: Secondary | ICD-10-CM | POA: Diagnosis present

## 2020-04-04 DIAGNOSIS — E1129 Type 2 diabetes mellitus with other diabetic kidney complication: Secondary | ICD-10-CM | POA: Diagnosis present

## 2020-04-04 DIAGNOSIS — I080 Rheumatic disorders of both mitral and aortic valves: Secondary | ICD-10-CM | POA: Diagnosis present

## 2020-04-04 DIAGNOSIS — E1121 Type 2 diabetes mellitus with diabetic nephropathy: Secondary | ICD-10-CM | POA: Diagnosis not present

## 2020-04-04 DIAGNOSIS — N184 Chronic kidney disease, stage 4 (severe): Secondary | ICD-10-CM | POA: Diagnosis present

## 2020-04-04 DIAGNOSIS — Z79899 Other long term (current) drug therapy: Secondary | ICD-10-CM

## 2020-04-04 DIAGNOSIS — Z7902 Long term (current) use of antithrombotics/antiplatelets: Secondary | ICD-10-CM

## 2020-04-04 LAB — CBC WITH DIFFERENTIAL/PLATELET
Abs Immature Granulocytes: 0.09 10*3/uL — ABNORMAL HIGH (ref 0.00–0.07)
Basophils Absolute: 0 10*3/uL (ref 0.0–0.1)
Basophils Relative: 0 %
Eosinophils Absolute: 0.1 10*3/uL (ref 0.0–0.5)
Eosinophils Relative: 0 %
HCT: 30.6 % — ABNORMAL LOW (ref 39.0–52.0)
Hemoglobin: 9.5 g/dL — ABNORMAL LOW (ref 13.0–17.0)
Immature Granulocytes: 1 %
Lymphocytes Relative: 4 %
Lymphs Abs: 0.6 10*3/uL — ABNORMAL LOW (ref 0.7–4.0)
MCH: 27.5 pg (ref 26.0–34.0)
MCHC: 31 g/dL (ref 30.0–36.0)
MCV: 88.7 fL (ref 80.0–100.0)
Monocytes Absolute: 1.1 10*3/uL — ABNORMAL HIGH (ref 0.1–1.0)
Monocytes Relative: 7 %
Neutro Abs: 13.4 10*3/uL — ABNORMAL HIGH (ref 1.7–7.7)
Neutrophils Relative %: 88 %
Platelets: 146 10*3/uL — ABNORMAL LOW (ref 150–400)
RBC: 3.45 MIL/uL — ABNORMAL LOW (ref 4.22–5.81)
RDW: 15.8 % — ABNORMAL HIGH (ref 11.5–15.5)
WBC: 15.3 10*3/uL — ABNORMAL HIGH (ref 4.0–10.5)
nRBC: 0 % (ref 0.0–0.2)

## 2020-04-04 LAB — COMPREHENSIVE METABOLIC PANEL
ALT: 17 U/L (ref 0–44)
AST: 15 U/L (ref 15–41)
Albumin: 3.5 g/dL (ref 3.5–5.0)
Alkaline Phosphatase: 91 U/L (ref 38–126)
Anion gap: 11 (ref 5–15)
BUN: 27 mg/dL — ABNORMAL HIGH (ref 8–23)
CO2: 30 mmol/L (ref 22–32)
Calcium: 8 mg/dL — ABNORMAL LOW (ref 8.9–10.3)
Chloride: 104 mmol/L (ref 98–111)
Creatinine, Ser: 1.8 mg/dL — ABNORMAL HIGH (ref 0.61–1.24)
GFR calc Af Amer: 39 mL/min — ABNORMAL LOW (ref >60)
GFR calc non Af Amer: 34 mL/min — ABNORMAL LOW (ref >60)
Glucose, Bld: 207 mg/dL — ABNORMAL HIGH (ref 70–99)
Potassium: 3.9 mmol/L (ref 3.5–5.1)
Sodium: 145 mmol/L (ref 135–145)
Total Bilirubin: 0.8 mg/dL (ref 0.3–1.2)
Total Protein: 8 g/dL (ref 6.5–8.1)

## 2020-04-04 LAB — RESPIRATORY PANEL BY RT PCR (FLU A&B, COVID)
Influenza A by PCR: NEGATIVE
Influenza B by PCR: NEGATIVE
SARS Coronavirus 2 by RT PCR: NEGATIVE

## 2020-04-04 LAB — URINALYSIS, COMPLETE (UACMP) WITH MICROSCOPIC
Bacteria, UA: NONE SEEN
Bilirubin Urine: NEGATIVE
Glucose, UA: 50 mg/dL — AB
Hgb urine dipstick: NEGATIVE
Ketones, ur: NEGATIVE mg/dL
Leukocytes,Ua: NEGATIVE
Nitrite: NEGATIVE
Protein, ur: 100 mg/dL — AB
Specific Gravity, Urine: 1.011 (ref 1.005–1.030)
Squamous Epithelial / HPF: NONE SEEN (ref 0–5)
pH: 6 (ref 5.0–8.0)

## 2020-04-04 LAB — BLOOD GAS, VENOUS
Acid-Base Excess: 7.4 mmol/L — ABNORMAL HIGH (ref 0.0–2.0)
Bicarbonate: 35 mmol/L — ABNORMAL HIGH (ref 20.0–28.0)
Delivery systems: POSITIVE
FIO2: 50
O2 Saturation: 64.5 %
Patient temperature: 37
pCO2, Ven: 62 mmHg — ABNORMAL HIGH (ref 44.0–60.0)
pH, Ven: 7.36 (ref 7.250–7.430)
pO2, Ven: 35 mmHg (ref 32.0–45.0)

## 2020-04-04 LAB — PROCALCITONIN: Procalcitonin: <0.1 ng/mL

## 2020-04-04 LAB — TROPONIN I (HIGH SENSITIVITY)
Troponin I (High Sensitivity): 16 ng/L (ref <18)
Troponin I (High Sensitivity): 16 ng/L (ref <18)

## 2020-04-04 LAB — GLUCOSE, CAPILLARY
Glucose-Capillary: 201 mg/dL — ABNORMAL HIGH (ref 70–99)
Glucose-Capillary: 193 mg/dL — ABNORMAL HIGH (ref 70–99)
Glucose-Capillary: 291 mg/dL — ABNORMAL HIGH (ref 70–99)

## 2020-04-04 LAB — LACTIC ACID, PLASMA: Lactic Acid, Venous: 1 mmol/L (ref 0.5–1.9)

## 2020-04-04 LAB — BRAIN NATRIURETIC PEPTIDE: B Natriuretic Peptide: 591 pg/mL — ABNORMAL HIGH (ref 0.0–100.0)

## 2020-04-04 LAB — STREP PNEUMONIAE URINARY ANTIGEN: Strep Pneumo Urinary Antigen: NEGATIVE

## 2020-04-04 MED ORDER — FUROSEMIDE 10 MG/ML IJ SOLN
40.0000 mg | Freq: Two times a day (BID) | INTRAMUSCULAR | Status: DC
  Administered 2020-04-04 – 2020-04-05 (×2): 40 mg via INTRAVENOUS
  Filled 2020-04-04 (×2): qty 4

## 2020-04-04 MED ORDER — SODIUM CHLORIDE 0.9 % IV SOLN: 250.0000 mL | INTRAVENOUS | Status: DC | PRN

## 2020-04-04 MED ORDER — HYDRALAZINE HCL 20 MG/ML IJ SOLN
5.0000 mg | INTRAMUSCULAR | Status: DC | PRN
  Filled 2020-04-04: qty 0.25

## 2020-04-04 MED ORDER — INSULIN ASPART 100 UNIT/ML ~~LOC~~ SOLN
0.0000 [IU] | Freq: Every day | SUBCUTANEOUS | Status: DC
  Administered 2020-04-04: 3 [IU] via SUBCUTANEOUS
  Administered 2020-04-05 – 2020-04-06 (×2): 4 [IU] via SUBCUTANEOUS
  Administered 2020-04-07: 5 [IU] via SUBCUTANEOUS
  Administered 2020-04-10: 2 [IU] via SUBCUTANEOUS
  Filled 2020-04-04 (×5): qty 1

## 2020-04-04 MED ORDER — SODIUM CHLORIDE 0.9 % IV SOLN
500.0000 mg | Freq: Once | INTRAVENOUS | Status: AC
  Administered 2020-04-04: 500 mg via INTRAVENOUS
  Filled 2020-04-04: qty 500

## 2020-04-04 MED ORDER — GABAPENTIN 100 MG PO CAPS
100.0000 mg | ORAL_CAPSULE | Freq: Two times a day (BID) | ORAL | Status: DC
  Administered 2020-04-04 – 2020-04-15 (×23): 100 mg via ORAL
  Filled 2020-04-04 (×24): qty 1

## 2020-04-04 MED ORDER — HYDRALAZINE HCL 50 MG PO TABS
100.0000 mg | ORAL_TABLET | Freq: Two times a day (BID) | ORAL | Status: DC
  Administered 2020-04-04 – 2020-04-13 (×16): 100 mg via ORAL
  Filled 2020-04-04 (×20): qty 2

## 2020-04-04 MED ORDER — DM-GUAIFENESIN ER 30-600 MG PO TB12
1.0000 | ORAL_TABLET | Freq: Two times a day (BID) | ORAL | Status: DC | PRN
  Administered 2020-04-06: 1 via ORAL
  Filled 2020-04-04: qty 1

## 2020-04-04 MED ORDER — FUROSEMIDE 10 MG/ML IJ SOLN
40.0000 mg | Freq: Once | INTRAMUSCULAR | Status: AC
  Administered 2020-04-04: 40 mg via INTRAVENOUS
  Filled 2020-04-04: qty 4

## 2020-04-04 MED ORDER — SODIUM CHLORIDE 0.9 % IV SOLN
2.0000 g | Freq: Once | INTRAVENOUS | Status: AC
  Administered 2020-04-04: 2 g via INTRAVENOUS
  Filled 2020-04-04: qty 2

## 2020-04-04 MED ORDER — INSULIN ASPART 100 UNIT/ML ~~LOC~~ SOLN
0.0000 [IU] | Freq: Three times a day (TID) | SUBCUTANEOUS | Status: DC
  Administered 2020-04-04: 3 [IU] via SUBCUTANEOUS
  Administered 2020-04-04: 2 [IU] via SUBCUTANEOUS
  Administered 2020-04-05 (×2): 5 [IU] via SUBCUTANEOUS
  Administered 2020-04-05: 3 [IU] via SUBCUTANEOUS
  Filled 2020-04-04 (×5): qty 1

## 2020-04-04 MED ORDER — METHYLPREDNISOLONE SODIUM SUCC 40 MG IJ SOLR
40.0000 mg | Freq: Two times a day (BID) | INTRAMUSCULAR | Status: DC
  Administered 2020-04-04 – 2020-04-05 (×4): 40 mg via INTRAVENOUS
  Filled 2020-04-04 (×4): qty 1

## 2020-04-04 MED ORDER — METHYLPREDNISOLONE SODIUM SUCC 125 MG IJ SOLR
125.0000 mg | Freq: Once | INTRAMUSCULAR | Status: AC
  Administered 2020-04-04: 125 mg via INTRAVENOUS
  Filled 2020-04-04: qty 2

## 2020-04-04 MED ORDER — CLOPIDOGREL BISULFATE 75 MG PO TABS
75.0000 mg | ORAL_TABLET | Freq: Every day | ORAL | Status: DC
  Administered 2020-04-04 – 2020-04-15 (×12): 75 mg via ORAL
  Filled 2020-04-04 (×13): qty 1

## 2020-04-04 MED ORDER — METOPROLOL SUCCINATE ER 50 MG PO TB24
200.0000 mg | ORAL_TABLET | Freq: Every day | ORAL | Status: DC
  Administered 2020-04-04 – 2020-04-15 (×12): 200 mg via ORAL
  Filled 2020-04-04: qty 4
  Filled 2020-04-04: qty 2
  Filled 2020-04-04: qty 4
  Filled 2020-04-04: qty 2
  Filled 2020-04-04 (×5): qty 4
  Filled 2020-04-04 (×3): qty 2

## 2020-04-04 MED ORDER — SODIUM CHLORIDE 0.9% FLUSH
3.0000 mL | INTRAVENOUS | Status: DC | PRN
  Administered 2020-04-12: 3 mL via INTRAVENOUS

## 2020-04-04 MED ORDER — FERROUS SULFATE 325 (65 FE) MG PO TABS
325.0000 mg | ORAL_TABLET | Freq: Two times a day (BID) | ORAL | Status: DC
  Administered 2020-04-04 – 2020-04-11 (×14): 325 mg via ORAL
  Filled 2020-04-04 (×14): qty 1

## 2020-04-04 MED ORDER — SODIUM CHLORIDE 0.9 % IV SOLN
1.0000 g | INTRAVENOUS | Status: AC
  Administered 2020-04-05 – 2020-04-08 (×4): 1 g via INTRAVENOUS
  Filled 2020-04-04 (×3): qty 1
  Filled 2020-04-04: qty 10
  Filled 2020-04-04: qty 1

## 2020-04-04 MED ORDER — ISOSORBIDE MONONITRATE ER 30 MG PO TB24
30.0000 mg | ORAL_TABLET | Freq: Every day | ORAL | Status: DC
  Administered 2020-04-04 – 2020-04-15 (×12): 30 mg via ORAL
  Filled 2020-04-04 (×12): qty 1

## 2020-04-04 MED ORDER — AMLODIPINE BESYLATE 5 MG PO TABS
5.0000 mg | ORAL_TABLET | Freq: Every day | ORAL | Status: DC
  Administered 2020-04-04 – 2020-04-15 (×12): 5 mg via ORAL
  Filled 2020-04-04 (×12): qty 1

## 2020-04-04 MED ORDER — ALBUTEROL SULFATE (2.5 MG/3ML) 0.083% IN NEBU: 2.5000 mg | INHALATION_SOLUTION | RESPIRATORY_TRACT | Status: DC | PRN

## 2020-04-04 MED ORDER — PANTOPRAZOLE SODIUM 40 MG PO TBEC
40.0000 mg | DELAYED_RELEASE_TABLET | Freq: Every day | ORAL | Status: DC
  Administered 2020-04-04 – 2020-04-15 (×12): 40 mg via ORAL
  Filled 2020-04-04 (×12): qty 1

## 2020-04-04 MED ORDER — VANCOMYCIN HCL IN DEXTROSE 1-5 GM/200ML-% IV SOLN
1000.0000 mg | Freq: Once | INTRAVENOUS | Status: AC
  Administered 2020-04-04: 1000 mg via INTRAVENOUS
  Filled 2020-04-04: qty 200

## 2020-04-04 MED ORDER — IPRATROPIUM-ALBUTEROL 0.5-2.5 (3) MG/3ML IN SOLN
3.0000 mL | RESPIRATORY_TRACT | Status: DC
  Administered 2020-04-04 – 2020-04-08 (×22): 3 mL via RESPIRATORY_TRACT
  Filled 2020-04-04 (×23): qty 3

## 2020-04-04 MED ORDER — ONDANSETRON HCL 4 MG/2ML IJ SOLN: 4.0000 mg | Freq: Four times a day (QID) | INTRAMUSCULAR | Status: DC | PRN

## 2020-04-04 MED ORDER — ACETAMINOPHEN 325 MG PO TABS: 650.0000 mg | ORAL_TABLET | ORAL | Status: DC | PRN

## 2020-04-04 MED ORDER — SODIUM CHLORIDE 0.9 % IV SOLN
500.0000 mg | INTRAVENOUS | Status: AC
  Administered 2020-04-05 – 2020-04-06 (×2): 500 mg via INTRAVENOUS
  Filled 2020-04-04 (×2): qty 500

## 2020-04-04 MED ORDER — SODIUM CHLORIDE 0.9% FLUSH
3.0000 mL | Freq: Two times a day (BID) | INTRAVENOUS | Status: DC
  Administered 2020-04-04 – 2020-04-14 (×17): 3 mL via INTRAVENOUS

## 2020-04-04 MED ORDER — ROSUVASTATIN CALCIUM 10 MG PO TABS
40.0000 mg | ORAL_TABLET | Freq: Every day | ORAL | Status: DC
  Administered 2020-04-04 – 2020-04-09 (×6): 40 mg via ORAL
  Filled 2020-04-04 (×7): qty 4

## 2020-04-04 MED ORDER — ENOXAPARIN SODIUM 40 MG/0.4ML ~~LOC~~ SOLN
40.0000 mg | SUBCUTANEOUS | Status: DC
  Administered 2020-04-04: 40 mg via SUBCUTANEOUS
  Filled 2020-04-04: qty 0.4

## 2020-04-04 NOTE — ED Notes (Signed)
Assigned bed @ [id number], spoke with RN Aldona Bar.

## 2020-04-04 NOTE — ED Notes (Signed)
Called ICU for pt's room assignment and pt's room assignment has now disappeared. Will Naval architect

## 2020-04-04 NOTE — ED Notes (Signed)
Family updated on pt's status.

## 2020-04-04 NOTE — ED Notes (Signed)
[place] family member and gave update on pt and room assignment number.

## 2020-04-04 NOTE — ED Triage Notes (Signed)
Pt to ED via EMS from home c/o respiratory distress tonight.  Placed on CPAP by EMS, placed on BiPAP upon arrival to ED.  C/o distended abd and lower edema and SOB x2 days.  Found to be 72% RA by fire but pt states supposed to wear 2L [place] at home.  EMS vitals BP 158/70, HR 80, 1 inch nitro paste.  CHF and COPD hx.

## 2020-04-04 NOTE — ED Notes (Addendum)
RN Danae Chen called and states she was not aware this RN had attempted to call for pt. This RN stated that I spoke with a Child psychotherapist who said she was getting the pt.   Discussed pt status with new RN Danae Chen.

## 2020-04-04 NOTE — Progress Notes (Signed)
PHARMACY -  BRIEF ANTIBIOTIC NOTE   Pharmacy has received consult(s) for vancomycin/cefepime from an ED provider.  The patient's profile has been reviewed for ht/wt/allergies/indication/available labs.    One time order(s) placed for vanc 2g IV load, cefepime 2g IV x 1  Further antibiotics/pharmacy consults should be ordered by admitting physician if indicated.                       Thank you,  Tobie Lords, PharmD, BCPS Clinical Pharmacist 04/04/2020  6:40 AM

## 2020-04-04 NOTE — Progress Notes (Signed)
Patient admitted to unit from ED via stretcher. Oriented to room, call bell, and staff. Bed in lowest position. Fall safety plan reviewed. Full assessment to Epic. Skin assessment verified with Linard Millers. Telemetry box verification with tele clerk- Box#:26.Patient currently on BiPap unable to do [place] at this time. Will continue to monitor.

## 2020-04-04 NOTE — ED Notes (Signed)
Called Pt placement and Admit MD has changed pt's level of care to Progressive care.  Admit MD messaged and updated on pts current room status now.

## 2020-04-04 NOTE — TOC Initial Note (Signed)
Transition of Care [facility]) - Initial/Assessment Note    Patient Details  Name: Brett [name] MRN: [id number] Date of Birth: 08-24-1935  [facility] CM/SW Contact:    Shelbie Ammons, RN Phone Number: 04/04/2020, 9:54 AM  Clinical Narrative:     [facility] consult received. Patient lives at home alone in single level home, but with daily assistance from friend and Karleen Hampshire. Patient is familiar with HF management and does weigh daily and understands importance of following up with MD for any weight gain of 3lbs in a day or 5lbs in a week. Attempted to reach friend [name] but VM wasn't set up yet on phone. RNCM will follow for any needs.          Expected Discharge Plan: [place]     Patient Goals and CMS Choice        Expected Discharge Plan and Services Expected Discharge Plan: [place] arrangements for the past 2 months: Single Family Home                                      Prior Living Arrangements/Services Living arrangements for the past 2 months: Single Family Home Lives with:: Self                   Activities of Daily Living      Permission Sought/Granted                  Emotional Assessment       Orientation: : Oriented to Self Alcohol / Substance Use: Not Applicable Psych Involvement: No (comment)  Admission diagnosis:  CHF, acute on chronic (HCC) [I50.9] Acute on chronic diastolic CHF (congestive heart failure) ([place]) [I50.33] Patient Active Problem List   Diagnosis Date Noted  . CHF, acute on chronic ([place]) 04/04/2020  . CKD (chronic kidney disease), stage IIIb 04/04/2020  . Type II diabetes mellitus with renal manifestations ([place]) 04/04/2020  . HLD (hyperlipidemia) 04/04/2020  . Acute on chronic diastolic CHF (congestive heart failure) ([place]) 04/04/2020  . Iron deficiency anemia 04/04/2020  . Hypernatremia   . Dysphagia following unspecified cerebrovascular disease    . High serum osmolar gap 11/26/2019  . Atrial fibrillation, chronic ([place]) 11/25/2019  . Acute encephalopathy 11/25/2019  . Atrial fibrillation with RVR ([place]) 05/22/2019  . Acute on chronic respiratory failure with hypoxemia ([place]) 02/09/2019  . Pneumonia 01/21/2019  . Type 2 diabetes mellitus with hyperosmolar nonketotic hyperglycemia ([place]) 08/24/2018  . CHF exacerbation ([place]) 05/29/2018  . COPD exacerbation ([place]) 12/05/2017  . CAP (community acquired pneumonia) 11/13/2017  . Acute respiratory failure ([place]) 11/13/2017  . SIRS (systemic inflammatory response syndrome) ([place]) 11/13/2017  . Stroke ([place]) 12/02/2016  . Stroke (cerebrum) ([place]) 12/02/2016  . Leg weakness, bilateral 09/03/2016  . Chronic diastolic heart failure ([place]) 08/06/2016  . Pleural effusion 08/05/2016  . Sepsis ([place]) 07/10/2016  . HCAP (healthcare-associated pneumonia) 07/10/2016  . HTN (hypertension) 07/10/2016  . Diabetes ([place]) 07/10/2016  . CAD (coronary artery disease) 07/10/2016  . COPD (chronic obstructive pulmonary disease) ([place]) 06/20/2016  . Chest pain 06/18/2016  . B-cell lymphoma ([place]) 10/14/2015   PCP:  Perrin Maltese, MD Pharmacy:   [id number] [address], [place] [address] Phone: [phone] Fax: [phone]  Social Determinants of Health (SDOH) Interventions    Readmission Risk Interventions Readmission Risk Prevention Plan 12/05/2019  PCP or Specialist appointment within 3-5 days of discharge Complete  HRI or [place] (No Data)  SW Recovery Care/Counseling Consult Complete  Palliative Care Screening Not Applicable  Skilled Nursing Facility Complete  Some recent data might be hidden

## 2020-04-04 NOTE — H&P (Signed)
History and Physical    Brett [name] RJJ:[id number] DOB: [birth year] DOA: 04/04/2020  Referring MD/NP/PA:   PCP: Perrin Maltese, MD   Patient coming from:  The patient is coming from home.  At baseline, pt is independent for most of ADL.        Chief Complaint: SOB  HPI: Brett [name] is a 85 y.o. male with medical history significant of hypertension, hyperlipidemia, diabetes mellitus, COPD on 2 L oxygen, stroke, GERD, PVD, PAD, iron deficiency anemia, dCHF, CAD, mitral valve regurgitation, aortic valve regurgitation, B-cell lymphoma (s/p of chemotherapy), atrial fibrillation not on anticoagulants, who presents with shortness breath.  Patient states that his shortness of breath has been going on for more than 2 days, which has been progressively worsening.  He has dry cough, no chest pain.  No fever or chills.  He was found to have oxygen desaturation to 72% on room air. BiPAP is started in ED. Patient is bilateral leg edema.  No nausea, vomiting, diarrhea or abdominal pain. He has abdominal distention.  No symptoms of UTI or unilateral weakness.  ED Course: pt was found to have BNP 591, troponin XVI, 16, negative COVID-19 PCR, renal function close to baseline, temperature normal, blood pressure 136/62, heart rate 83, tachypnea.  Chest x-ray showed cardiomegaly, interstitial pulmonary edema and patchy right-sided infiltration.  Patient is admitted to progressive bed as inpatient.  Review of Systems:   General: no fevers, chills, no body weight gain, has fatigue HEENT: no blurry vision, hearing changes or sore throat Respiratory: has dyspnea, coughing, no wheezing CV: no chest pain, no palpitations GI: no nausea, vomiting, abdominal pain, diarrhea, constipation GU: no dysuria, burning on urination, increased urinary frequency, hematuria  Ext: has leg edema Neuro: no unilateral weakness, numbness, or tingling, no vision change or hearing loss Skin: no rash, no skin tear. MSK: No  muscle spasm, no deformity, no limitation of range of movement in spin Heme: No easy bruising.  Travel history: No recent long distant travel.  Allergy: No Known Allergies  Past Medical History:  Diagnosis Date  . Anemia   . Aortic regurgitation   . B-cell lymphoma ([place]) 2009   DX AT [facility]  . Bronchitis   . CAD (coronary artery disease)   . Carotid stenosis   . CHF (congestive heart failure) ([place])   . Diabetes mellitus without complication ([place])   . Diabetes mellitus, type 2 ([place])   . Emphysema of lung ([place])   . Essential hypertension   . History of chemotherapy   . Hyperlipidemia   . Hypertension   . IDA (iron deficiency anemia)   . Leg edema   . Meralgia paraesthetica   . Mitral regurgitation   . PUD (peptic ulcer disease)   . PVD (peripheral vascular disease) ([place])   . Tobacco abuse     Past Surgical History:  Procedure Laterality Date  . CARDIAC CATHETERIZATION  08/15/2007  . CARDIAC CATHETERIZATION Right 07/13/2016   Procedure: Right/Left Heart Cath and Coronary Angiography;  Surgeon: Dionisio David, MD;  Location: [facility];  Service: Cardiovascular;  Laterality: Right;  . COLONOSCOPY  03/2013  . ESOPHAGOGASTRODUODENOSCOPY  03/2013    Social History:  reports that he quit smoking about 4 years ago. His smoking use included cigarettes. He has never used smokeless tobacco. He reports that he does not drink alcohol or use drugs.  Family History:  Family History  Problem Relation Age of Onset  . CAD Mother   .  Rheum arthritis Neg Hx   . Osteoarthritis Neg Hx   . Asthma Neg Hx   . Diabetes Neg Hx   . Cancer Neg Hx      Prior to Admission medications   Medication Sig Start Date End Date Taking? Authorizing Provider  albuterol (PROVENTIL HFA;VENTOLIN HFA) 108 (90 Base) MCG/ACT inhaler Inhale 2 puffs into the lungs every 6 (six) hours as needed for wheezing or shortness of breath. 09/24/17   Loletha Grayer, MD  amiodarone (PACERONE) 200 MG tablet Take 1  tablet (200 mg total) by mouth daily. 07/16/19   Loletha Grayer, MD  amLODipine (NORVASC) 5 MG tablet Take 5 mg by mouth daily.  08/27/15   [provider]  ammonium lactate (LAC-HYDRIN) 12 % lotion Apply topically 2 (two) times daily. 07/16/19   Loletha Grayer, MD  clopidogrel (PLAVIX) 75 MG tablet Take 75 mg by mouth daily.  08/27/15   [provider]  cyanocobalamin (,VITAMIN B-12,) 1000 MCG/ML injection Inject 1,000 mcg into the muscle every 30 (thirty) days.    [provider]  ferrous sulfate 325 (65 FE) MG tablet Take 1 tablet (325 mg total) by mouth 2 (two) times daily with a meal. 05/25/19   Fritzi Mandes, MD  gabapentin (NEURONTIN) 100 MG capsule Take 1 capsule by mouth 2 (two) times daily.  09/22/15   [provider]  glimepiride (AMARYL) 2 MG tablet Take 1 tablet (2 mg total) by mouth daily with breakfast. Patient taking differently: Take 4 mg by mouth daily with breakfast.  07/16/19 11/25/19  Loletha Grayer, MD  ipratropium-albuterol (DUONEB) 0.5-2.5 (3) MG/3ML SOLN J44.1  46ml nebulizer every six hours as needed for shortness of breath 07/16/19   Loletha Grayer, MD  isosorbide mononitrate (IMDUR) 30 MG 24 hr tablet Take 30 mg by mouth daily.    [provider]  metoprolol (TOPROL-XL) 50 MG 24 hr tablet Take 1 tablet (50 mg total) by mouth 2 (two) times daily. 12/05/19   Nolberto Hanlon, MD  mometasone-formoterol (DULERA) 100-5 MCG/ACT AERO Inhale 2 puffs into the lungs 2 (two) times daily. 07/16/19   Loletha Grayer, MD  pantoprazole (PROTONIX) 40 MG tablet Take 40 mg by mouth daily.    [provider]  rosuvastatin (CRESTOR) 40 MG tablet Take 40 mg by mouth daily. 04/13/19   [provider]  terbinafine (LAMISIL) 1 % cream Apply twice a day to feet (okay to substitute any anti fungal cream that is the cheapest) 07/16/19   Loletha Grayer, MD  tiotropium (SPIRIVA HANDIHALER) 18 MCG inhalation capsule Place 1 capsule (18 mcg total)  into inhaler and inhale daily. Patient not taking: Reported on 11/25/2019 05/31/18   Henreitta Leber, MD  Vitamin D, Ergocalciferol, (DRISDOL) 1.25 MG (50000 UT) CAPS capsule Take 50,000 Units by mouth once a week.    [provider]    Physical Exam: Vitals:   04/04/20 0815 04/04/20 0830 04/04/20 0850 04/04/20 0900  BP:  133/62 133/60 132/64  Pulse: 69 72 68 66  Resp: (!) 21 (!) 23 (!) 22 15  Temp:      TempSrc:      SpO2: 95% 95% 95% 95%  Weight:      Height:       General: Not in acute distress HEENT:       Eyes: PERRL, EOMI, no scleral icterus.       ENT: No discharge from the ears and nose, no pharynx injection, no tonsillar enlargement.  Neck: positive JVD, no bruit, no mass felt. Heme: No neck lymph node enlargement. Cardiac: S1/S2, RRR, No gallops or rubs. Respiratory: has rhonchi bilaterally GI: Soft, nondistended, nontender, no rebound pain, no organomegaly, BS present. GU: No hematuria Ext: 1+ pitting leg edema bilaterally. 1+DP/PT pulse bilaterally. Musculoskeletal: No joint deformities, No joint redness or warmth, no limitation of ROM in spin. Skin: No rashes.  Neuro: Alert, oriented X3, cranial nerves II-XII grossly intact, moves all extremities normally. Psych: Patient is not psychotic, no suicidal or hemocidal ideation.  Labs on Admission: I have personally reviewed following labs and imaging studies  CBC: Recent Labs  Lab 04/04/20 0446  WBC 15.3*  NEUTROABS 13.4*  HGB 9.5*  HCT 30.6*  MCV 88.7  PLT 778*   Basic Metabolic Panel: Recent Labs  Lab 04/04/20 0446  NA 145  K 3.9  CL 104  CO2 30  GLUCOSE 207*  BUN 27*  CREATININE 1.80*  CALCIUM 8.0*   GFR: Estimated Creatinine Clearance: 33.7 mL/min (A) (by C-G formula based on SCr of 1.8 mg/dL (H)). Liver Function Tests: Recent Labs  Lab 04/04/20 0446  AST 15  ALT 17  ALKPHOS 91  BILITOT 0.8  PROT 8.0  ALBUMIN 3.5   No results for input(s): LIPASE, AMYLASE in the last  168 hours. No results for input(s): AMMONIA in the last 168 hours. Coagulation Profile: No results for input(s): INR, PROTIME in the last 168 hours. Cardiac Enzymes: No results for input(s): CKTOTAL, CKMB, CKMBINDEX, TROPONINI in the last 168 hours. BNP (last 3 results) No results for input(s): PROBNP in the last 8760 hours. HbA1C: No results for input(s): HGBA1C in the last 72 hours. CBG: No results for input(s): GLUCAP in the last 168 hours. Lipid Profile: No results for input(s): CHOL, HDL, LDLCALC, TRIG, CHOLHDL, LDLDIRECT in the last 72 hours. Thyroid Function Tests: No results for input(s): TSH, T4TOTAL, FREET4, T3FREE, THYROIDAB in the last 72 hours. Anemia Panel: No results for input(s): VITAMINB12, FOLATE, FERRITIN, TIBC, IRON, RETICCTPCT in the last 72 hours. Urine analysis:    Component Value Date/Time   COLORURINE YELLOW (A) 04/04/2020 0628   APPEARANCEUR CLEAR (A) 04/04/2020 0628   LABSPEC 1.011 04/04/2020 0628   PHURINE 6.0 04/04/2020 0628   GLUCOSEU 50 (A) 04/04/2020 0628   HGBUR NEGATIVE 04/04/2020 0628   BILIRUBINUR NEGATIVE 04/04/2020 0628   KETONESUR NEGATIVE 04/04/2020 0628   PROTEINUR 100 (A) 04/04/2020 0628   NITRITE NEGATIVE 04/04/2020 0628   LEUKOCYTESUR NEGATIVE 04/04/2020 0628   Sepsis Labs: @LABRCNTIP (procalcitonin:4,lacticidven:4) ) Recent Results (from the past 240 hour(s))  Blood culture (routine x 2)     Status: None (Preliminary result)   Collection Time: 04/04/20  4:47 AM   Specimen: Right Antecubital; Blood  Result Value Ref Range Status   Specimen Description RIGHT ANTECUBITAL  Final   Special Requests   Final    BOTTLES DRAWN AEROBIC AND ANAEROBIC Blood Culture adequate volume   Culture   Final    NO GROWTH <12 HOURS Performed at [facility], [address] [place], [place] [address]    Report Status PENDING  Incomplete  Respiratory Panel by RT PCR (Flu A&B, Covid) - Nasopharyngeal Swab     Status: None   Collection  Time: 04/04/20  4:48 AM   Specimen: Nasopharyngeal Swab  Result Value Ref Range Status   SARS Coronavirus 2 by RT PCR NEGATIVE NEGATIVE Final    Comment: (NOTE) SARS-CoV-2 target nucleic acids are NOT DETECTED. The SARS-CoV-2 RNA is generally  detectable in upper respiratoy specimens during the acute phase of infection. The lowest concentration of SARS-CoV-2 viral copies this assay can detect is 131 copies/mL. A negative result does not preclude SARS-Cov-2 infection and should not be used as the sole basis for treatment or other patient management decisions. A negative result may occur with  improper specimen collection/handling, submission of specimen other than nasopharyngeal swab, presence of viral mutation(s) within the areas targeted by this assay, and inadequate number of viral copies (<131 copies/mL). A negative result must be combined with clinical observations, patient history, and epidemiological information. The expected result is Negative. Fact Sheet for Patients:  PinkCheek.be Fact Sheet for Healthcare Providers:  GravelBags.it This test is not yet ap proved or cleared by the [place] FDA and  has been authorized for detection and/or diagnosis of SARS-CoV-2 by FDA under an Emergency Use Authorization (EUA). This EUA will remain  in effect (meaning this test can be used) for the duration of the COVID-19 declaration under Section 564(b)(1) of the Act, 21 U.S.C. section 360bbb-3(b)(1), unless the authorization is terminated or revoked sooner.    Influenza A by PCR NEGATIVE NEGATIVE Final   Influenza B by PCR NEGATIVE NEGATIVE Final    Comment: (NOTE) The Xpert Xpress SARS-CoV-2/FLU/RSV assay is intended as an aid in  the diagnosis of influenza from Nasopharyngeal swab specimens and  should not be used as a sole basis for treatment. Nasal washings and  aspirates are unacceptable for Xpert Xpress  SARS-CoV-2/FLU/RSV  testing. Fact Sheet for Patients: PinkCheek.be Fact Sheet for Healthcare Providers: GravelBags.it This test is not yet approved or cleared by the [place] FDA and  has been authorized for detection and/or diagnosis of SARS-CoV-2 by  FDA under an Emergency Use Authorization (EUA). This EUA will remain  in effect (meaning this test can be used) for the duration of the  Covid-19 declaration under Section 564(b)(1) of the Act, 21  U.S.C. section 360bbb-3(b)(1), unless the authorization is  terminated or revoked. Performed at [facility], [address] [place], [place] [address]   Blood culture (routine x 2)     Status: None (Preliminary result)   Collection Time: 04/04/20  4:52 AM   Specimen: BLOOD LEFT WRIST  Result Value Ref Range Status   Specimen Description BLOOD LEFT WRIST  Final   Special Requests   Final    BOTTLES DRAWN AEROBIC AND ANAEROBIC Blood Culture adequate volume   Culture   Final    NO GROWTH <12 HOURS Performed at [facility], [address], [place], [place] [address]    Report Status PENDING  Incomplete     Radiological Exams on Admission: DG Chest Port 1 View  Result Date: 04/04/2020 CLINICAL DATA:  Shortness of breath. Lower extremity edema. Hypoxia. EXAM: PORTABLE CHEST 1 VIEW COMPARISON:  One-view chest x-ray 11/30/2019. FINDINGS: Heart is enlarged. Diffuse interstitial pattern is present. Mild patchy opacities are superimposed on the right. Small effusions are suggested. Degenerative changes are noted at the right [facility] joint. IMPRESSION: 1. Cardiomegaly with diffuse interstitial pattern compatible with edema. 2. Patchy airspace disease on the right compatible with infection. Electronically Signed   By: San Morelle M.D.   On: 04/04/2020 05:08     EKG: Independently reviewed.  Sinus rhythm, QTC 493, possible left atrial enlargement, nonspecific T  wave change.  Assessment/Plan Principal Problem:   Acute on chronic respiratory failure with hypoxemia (HCC) Active Problems:   HTN (hypertension)   CAD (coronary artery disease)  Stroke ([place])   CAP (community acquired pneumonia)   COPD exacerbation (HCC)   Atrial fibrillation, chronic (HCC)   CHF, acute on chronic (HCC)   CKD (chronic kidney disease), stage IIIb   Type II diabetes mellitus with renal manifestations (HCC)   HLD (hyperlipidemia)   Acute on chronic diastolic CHF (congestive heart failure) (HCC)   Iron deficiency anemia   Acute on chronic respiratory failure with hypoxemia ([place]): Likely due to combination of CHF exacerbation and COPD exacerbation.  Patient also has possible CAP given leukocytosis and patchy infiltration on right side of the lung on chest x-ray.  Patient has positive JVD, 1+ leg edema, elevated BNP.  Chest x-ray showed cardiomegaly and interstitial pulmonary edema, clinically consistent with CHF exacerbation.  Patient has rhonchi bilaterally on auscultation, indicating COPD exacerbation.  VBG showed pH 7.36, CO2 62, O2 64.5  -Admitted to progressive unit as inpatient -Continue BiPAP -Bronchodilators -Solu-Medrol -Antibiotics for pneumonia  Acute on chronic diastolic CHF: 2D echo on 96/75/9163 showed EF of 50-55% -Lasix 40 mg bid by IV -2d echo -Daily weights -strict I/O's -Low salt diet -Fluid restriction -Obtain REDs Vest reading  COPD exacerbation and possible CAP: - IV Rocephin and azithromycin (patient received 1 dose of vancomycin and cefepime by ED) - Mucinex for cough  - Bronchodilators - Solu-Medrol 40 mg twice daily - Urine legionella and S. pneumococcal antigen - Follow up blood culture x2, sputum culture   HTN:  -Continue home medications: Amlodipine, hydralazine, metoprolol -hydralazine prn  Hx ofCAD (coronary artery disease): no CP. Trop 16b -->16. -Continue Plavix, Crestor, Imure  Hx of Stroke [facility]): -plavix and  crestor  Atrial fibrillation, chronic ([place]): Not on anticoagulants at home. -continue metoprolol  CKD (chronic kidney disease), stage IIIb: Close to baseline.  Baseline creatinine 1.6-1.2.  His creatinine is 1.8, BUN 27 -f/u by BMP  Type II diabetes mellitus with renal manifestations ([place]): Most recent A1c 12.4, poorly controled. Patient is taking Amaryl at home -SSI  HLD (hyperlipidemia) -Crestor  Iron deficiency anemia: Hgb 9.8 on 12/05/19 -->9.5 today. -f/u by CBC       DVT ppx: SQ Lovenox Code Status: DNR (pt was DNR before. I discussed with patient in the presence of RT, and explained the meaning of CODE STATUS. Patient wants sto be DNR).  Family Communication: not done, no family member is at bed side.   I have called his caregiver using phone number in epic but nobody picked up the phone for several times. Disposition Plan:  Anticipate discharge back to previous home environment Consults called:  none Admission status:    progressive unit as inpt     Status is: Inpatient Remains inpatient appropriate because:Inpatient level of care appropriate due to severity of illness patient has acute on chronic respiratory failure with hypoxia due to combination of CHF and COPD exacerbation. Pt also has possible CAP. Patient is requiring BiPAP.  Given his multiple comorbidities, and the severity of current presentations, patient is at high risk of deterioration.  Need to be treated in hospital for at least 2 days. Dispo: The patient is from: Home              Anticipated d/c is to: Home              Anticipated d/c date is: 2 days              Patient currently is not medically stable to d/c.  Date of Service 04/04/2020    Ivor Costa [facility]   If 7PM-7AM, please contact night-coverage [email or web address] 04/04/2020, 10:33 AM

## 2020-04-04 NOTE — ED Notes (Signed)
Attempted to call floor for pt's room assignment. RN not available at this time.

## 2020-04-04 NOTE — ED Notes (Signed)
Pt placed on 6L oxygen via [place] per MD verbal order. Pt dropped to 88% within the first minute. Pt now back on Bipap, MD made aware.

## 2020-04-04 NOTE — ED Notes (Signed)
Lab to add on urine tests to urine already in lab

## 2020-04-04 NOTE — ED Notes (Signed)
Claudette Head [phone] medical POA

## 2020-04-04 NOTE — ED Provider Notes (Signed)
[facility] Emergency Department Provider Note  ____________________________________________   First MD Initiated Contact with Patient 04/04/20 [phone]     (approximate)  I have reviewed the triage vital signs and the nursing notes.   HISTORY  Chief Complaint Respiratory Distress    HPI Brett [name] is a 85 y.o. male CAD, CHF, diabetes, COPD with frequent admissions for pneumonia, Anemia,B-cell lymphoma status post chemotherapy, CAD, carotid stenosis, DM2, HTN, HLD, peptic ulcer disease, peripheral vascular disease fibrillation  who comes in for shortness of breath.  Patient states that he started feel short of breath 2 days ago, constant, nothing makes it better, nothing makes it worse.  Patient stated that he wanted to hold off coming to the hospital as long as possible.  On EMS arrival patient was satting in the 61s.  It sounds like patient is on 2 L at baseline.  Denies any fevers and reports being Covid vaccinated.  Patient states that his abdomen feels a little bit just more distended as well.          Past Medical History:  Diagnosis Date  . Anemia   . Aortic regurgitation   . B-cell lymphoma ([place]) 2009   DX AT [facility]  . Bronchitis   . CAD (coronary artery disease)   . Carotid stenosis   . CHF (congestive heart failure) ([place])   . Diabetes mellitus without complication ([place])   . Diabetes mellitus, type 2 ([place])   . Emphysema of lung ([place])   . Essential hypertension   . History of chemotherapy   . Hyperlipidemia   . Hypertension   . IDA (iron deficiency anemia)   . Leg edema   . Meralgia paraesthetica   . Mitral regurgitation   . PUD (peptic ulcer disease)   . PVD (peripheral vascular disease) ([place])   . Tobacco abuse     Patient Active Problem List   Diagnosis Date Noted  . Hypernatremia   . Dysphagia following unspecified cerebrovascular disease   . High serum osmolar gap 11/26/2019  . AF (paroxysmal atrial fibrillation) ([place])  11/25/2019  . Acute encephalopathy 11/25/2019  . Atrial fibrillation with RVR ([place]) 05/22/2019  . Acute on chronic respiratory failure with hypoxemia ([place]) 02/09/2019  . Pneumonia 01/21/2019  . Type 2 diabetes mellitus with hyperosmolar nonketotic hyperglycemia ([place]) 08/24/2018  . CHF exacerbation ([place]) 05/29/2018  . COPD exacerbation ([place]) 12/05/2017  . CAP (community acquired pneumonia) 11/13/2017  . Acute respiratory failure ([place]) 11/13/2017  . SIRS (systemic inflammatory response syndrome) ([place]) 11/13/2017  . Stroke ([place]) 12/02/2016  . Stroke (cerebrum) ([place]) 12/02/2016  . Leg weakness, bilateral 09/03/2016  . Chronic diastolic heart failure ([place]) 08/06/2016  . Pleural effusion 08/05/2016  . Sepsis ([place]) 07/10/2016  . HCAP (healthcare-associated pneumonia) 07/10/2016  . HTN (hypertension) 07/10/2016  . Diabetes ([place]) 07/10/2016  . CAD (coronary artery disease) 07/10/2016  . COPD (chronic obstructive pulmonary disease) ([place]) 06/20/2016  . Chest pain 06/18/2016  . B-cell lymphoma ([place]) 10/14/2015    Past Surgical History:  Procedure Laterality Date  . CARDIAC CATHETERIZATION  08/15/2007  . CARDIAC CATHETERIZATION Right 07/13/2016   Procedure: Right/Left Heart Cath and Coronary Angiography;  Surgeon: Dionisio David, MD;  Location: [facility];  Service: Cardiovascular;  Laterality: Right;  . COLONOSCOPY  03/2013  . ESOPHAGOGASTRODUODENOSCOPY  03/2013    Prior to Admission medications   Medication Sig Start Date End Date Taking? Authorizing Provider  albuterol (PROVENTIL HFA;VENTOLIN HFA) 108 (90 Base) MCG/ACT inhaler  Inhale 2 puffs into the lungs every 6 (six) hours as needed for wheezing or shortness of breath. 09/24/17   Loletha Grayer, MD  amiodarone (PACERONE) 200 MG tablet Take 1 tablet (200 mg total) by mouth daily. 07/16/19   Loletha Grayer, MD  amLODipine (NORVASC) 5 MG tablet Take 5 mg by mouth daily.  08/27/15   [provider]  ammonium lactate  (LAC-HYDRIN) 12 % lotion Apply topically 2 (two) times daily. 07/16/19   Loletha Grayer, MD  clopidogrel (PLAVIX) 75 MG tablet Take 75 mg by mouth daily.  08/27/15   [provider]  cyanocobalamin (,VITAMIN B-12,) 1000 MCG/ML injection Inject 1,000 mcg into the muscle every 30 (thirty) days.    [provider]  ferrous sulfate 325 (65 FE) MG tablet Take 1 tablet (325 mg total) by mouth 2 (two) times daily with a meal. 05/25/19   Fritzi Mandes, MD  gabapentin (NEURONTIN) 100 MG capsule Take 1 capsule by mouth 2 (two) times daily.  09/22/15   [provider]  glimepiride (AMARYL) 2 MG tablet Take 1 tablet (2 mg total) by mouth daily with breakfast. Patient taking differently: Take 4 mg by mouth daily with breakfast.  07/16/19 11/25/19  Loletha Grayer, MD  ipratropium-albuterol (DUONEB) 0.5-2.5 (3) MG/3ML SOLN J44.1  9ml nebulizer every six hours as needed for shortness of breath 07/16/19   Loletha Grayer, MD  isosorbide mononitrate (IMDUR) 30 MG 24 hr tablet Take 30 mg by mouth daily.    [provider]  metoprolol (TOPROL-XL) 50 MG 24 hr tablet Take 1 tablet (50 mg total) by mouth 2 (two) times daily. 12/05/19   Nolberto Hanlon, MD  mometasone-formoterol (DULERA) 100-5 MCG/ACT AERO Inhale 2 puffs into the lungs 2 (two) times daily. 07/16/19   Loletha Grayer, MD  pantoprazole (PROTONIX) 40 MG tablet Take 40 mg by mouth daily.    [provider]  rosuvastatin (CRESTOR) 40 MG tablet Take 40 mg by mouth daily. 04/13/19   [provider]  terbinafine (LAMISIL) 1 % cream Apply twice a day to feet (okay to substitute any anti fungal cream that is the cheapest) 07/16/19   Loletha Grayer, MD  tiotropium (SPIRIVA HANDIHALER) 18 MCG inhalation capsule Place 1 capsule (18 mcg total) into inhaler and inhale daily. Patient not taking: Reported on 11/25/2019 05/31/18   Henreitta Leber, MD  Vitamin D, Ergocalciferol, (DRISDOL) 1.25 MG (50000 UT) CAPS capsule Take  50,000 Units by mouth once a week.    [provider]    Allergies Patient has no known allergies.  Family History  Problem Relation Age of Onset  . CAD Mother   . Rheum arthritis Neg Hx   . Osteoarthritis Neg Hx   . Asthma Neg Hx   . Diabetes Neg Hx   . Cancer Neg Hx     Social History Social History   Tobacco Use  . Smoking status: Former Smoker    Types: Cigarettes    Quit date: 08/30/2015    Years since quitting: 4.6  . Smokeless tobacco: Never Used  Substance Use Topics  . Alcohol use: No  . Drug use: No      Review of Systems Constitutional: No fever/chills Eyes: No visual changes. ENT: No sore throat. Cardiovascular: No chest pain Respiratory: Positive for SOB Gastrointestinal: No abdominal pain.  No nausea, no vomiting.  No diarrhea.  No constipation. Genitourinary: Negative for dysuria. Musculoskeletal: Negative for back pain. Skin: Negative for rash. Neurological: Negative for headaches, focal weakness  or numbness. All other ROS negative ____________________________________________   PHYSICAL EXAM:  VITAL SIGNS: Blood pressure (!) 159/68, pulse 89, temperature 98.4 F (36.9 C), temperature source Axillary, resp. rate (!) 34, height 5\' 9"  (1.753 m), weight 85.8 kg, SpO2 95 %.  Constitutional: Alert and oriented. Well appearing and in no acute distress. Eyes: Conjunctivae are normal. EOMI. Head: Atraumatic. Nose: No congestion/rhinnorhea. Mouth/Throat: Mucous membranes are moist.   Neck: No stridor. Trachea Midline. FROM Cardiovascular: Normal rate, regular rhythm. Grossly normal heart sounds.  Good peripheral circulation. Respiratory: Increased work of breathing with crackles bilaterally and coarse breath sounds Gastrointestinal: Soft and nontender.  Slight distention. No abdominal bruits.  Musculoskeletal: 1+ edema bilaterally no joint effusions. Neurologic:  Normal speech and language. No gross focal neurologic deficits are  appreciated.  Skin:  Skin is warm, dry and intact. No rash noted. Psychiatric: Mood and affect are normal. Speech and behavior are normal. GU: Deferred   ____________________________________________   LABS (all labs ordered are listed, but only abnormal results are displayed)  Labs Reviewed  COMPREHENSIVE METABOLIC PANEL - Abnormal; Notable for the following components:      Result Value   Glucose, Bld 207 (*)    BUN 27 (*)    Creatinine, Ser 1.80 (*)    Calcium 8.0 (*)    GFR calc non Af Amer 34 (*)    GFR calc Af Amer 39 (*)    All other components within normal limits  CBC WITH DIFFERENTIAL/PLATELET - Abnormal; Notable for the following components:   WBC 15.3 (*)    RBC 3.45 (*)    Hemoglobin 9.5 (*)    HCT 30.6 (*)    RDW 15.8 (*)    Platelets 146 (*)    Neutro Abs 13.4 (*)    Lymphs Abs 0.6 (*)    Monocytes Absolute 1.1 (*)    Abs Immature Granulocytes 0.09 (*)    All other components within normal limits  BRAIN NATRIURETIC PEPTIDE - Abnormal; Notable for the following components:   B Natriuretic Peptide 591.0 (*)    All other components within normal limits  BLOOD GAS, VENOUS - Abnormal; Notable for the following components:   pCO2, Ven 62 (*)    Bicarbonate 35.0 (*)    Acid-Base Excess 7.4 (*)    All other components within normal limits  CULTURE, BLOOD (ROUTINE X 2)  CULTURE, BLOOD (ROUTINE X 2)  RESPIRATORY PANEL BY RT PCR (FLU A&B, COVID)  URINE CULTURE  PROCALCITONIN  LACTIC ACID, PLASMA  URINALYSIS, COMPLETE (UACMP) WITH MICROSCOPIC  TROPONIN I (HIGH SENSITIVITY)  TROPONIN I (HIGH SENSITIVITY)   ____________________________________________   ED ECG REPORT I, Vanessa Winslow, the attending physician, personally viewed and interpreted this ECG.  EKG is sinus rate of 88, no ST elevations, no T wave inversions except for aVL, normal intervals ____________________________________________  RADIOLOGY Robert Bellow, personally viewed and evaluated these  images (plain radiographs) as part of my medical decision making, as well as reviewing the written report by the radiologist.  ED MD interpretation: Bilateral opacifications  Official radiology report(s): DG Chest Port 1 View  Result Date: 04/04/2020 CLINICAL DATA:  Shortness of breath. Lower extremity edema. Hypoxia. EXAM: PORTABLE CHEST 1 VIEW COMPARISON:  One-view chest x-ray 11/30/2019. FINDINGS: Heart is enlarged. Diffuse interstitial pattern is present. Mild patchy opacities are superimposed on the right. Small effusions are suggested. Degenerative changes are noted at the right [facility] joint. IMPRESSION: 1. Cardiomegaly with diffuse interstitial pattern compatible with edema.  2. Patchy airspace disease on the right compatible with infection. Electronically Signed   By: San Morelle M.D.   On: 04/04/2020 05:08    ____________________________________________   PROCEDURES  Procedure(s) performed (including Critical Care):  .Critical Care Performed by: Vanessa [employer], MD Authorized by: Vanessa [place]pearl, MD   Critical care provider statement:    Critical care time (minutes):  45   Critical care was necessary to treat or prevent imminent or life-threatening deterioration of the following conditions:  Respiratory failure   Critical care was time spent personally by me on the following activities:  Discussions with consultants, evaluation of patient's response to treatment, examination of patient, ordering and performing treatments and interventions, ordering and review of laboratory studies, ordering and review of radiographic studies, pulse oximetry, re-evaluation of patient's condition, obtaining history from patient or surrogate and review of old charts     ____________________________________________   INITIAL IMPRESSION / [place] / ED COURSE   Brett [name] was evaluated in Emergency Department on 04/04/2020 for the symptoms described in the history of present  illness. He was evaluated in the context of the global COVID-19 pandemic, which necessitated consideration that the patient might be at risk for infection with the SARS-CoV-2 virus that causes COVID-19. Institutional protocols and algorithms that pertain to the evaluation of patients at risk for COVID-19 are in a state of rapid change based on information released by regulatory bodies including the CDC and federal and state organizations. These policies and algorithms were followed during the patient's care in the ED.     Pt presents with SOB. Differential includes: Fluid overload PNA-will get xray to evaluation Anemia-CBC to evaluate ACS- will get trops Arrhythmia-Will get EKG and keep on monitor.  COVID- will get testing per algorithm. PE-lower suspicion given no risk factors and other cause more likely   Patient's BNP is elevated from his baseline.  His chest x-ray looks concerning for pulmonary edema versus infection.  Will cover with broad-spectrum antibiotics.  We will give a dose of Lasix.  We will hold off on fluid resuscitation given he is not hypotensive.  Patient was attempted to be taken off the BiPAP and on 6 L but he desatted.  Given this we will keep patient on BiPAP.  Given the x-ray findings I have lower suspicion that this is related to pulmonary embolism.  Patient is had multiple other admissions before.  Patient does have significant COPD but I really did not hear any wheezing.  We will give some IV Solu-Medrol just to be safe.  Reviewed patient's prior records has had multiple admissions for COPD and pneumonias.    We will discuss possible team for admission    ____________________________________________   FINAL CLINICAL IMPRESSION(S) / ED DIAGNOSES   Final diagnoses:  Acute pulmonary edema ([place])  Community acquired pneumonia, unspecified laterality  Acute respiratory failure with hypoxia ([place])     MEDICATIONS GIVEN DURING THIS VISIT:  Medications  vancomycin  (VANCOCIN) IVPB 1000 mg/200 mL premix (1,000 mg Intravenous New Bag/Given 04/04/20 0638)  azithromycin (ZITHROMAX) 500 mg in sodium chloride 0.9 % 250 mL IVPB (500 mg Intravenous New Bag/Given 04/04/20 0638)  vancomycin (VANCOCIN) IVPB 1000 mg/200 mL premix (has no administration in time range)  methylPREDNISolone sodium succinate (SOLU-MEDROL) 125 mg/2 mL injection 125 mg (has no administration in time range)  furosemide (LASIX) injection 40 mg (40 mg Intravenous Given 04/04/20 0632)  ceFEPIme (MAXIPIME) 2 g in sodium chloride 0.9 % 100 mL IVPB (  Intravenous Stopped 04/04/20 0706)     ED Discharge Orders    None       Note:  This document was prepared using Dragon voice recognition software and may include unintentional dictation errors.   Vanessa , MD 04/04/20 [phone]

## 2020-04-04 NOTE — ED Notes (Signed)
Family at bedside. Family updated on pt's status and plan of care.

## 2020-04-04 NOTE — ED Notes (Signed)
Called Pharmacy and they verified pt is to get a total of 2g of Van.  Will start 2nd dose once other is finished.

## 2020-04-05 ENCOUNTER — Inpatient Hospital Stay
Admit: 2020-04-05 | Discharge: 2020-04-05 | Disposition: A | Discharge status: Home / Self Care | Payer: Medicare PPO | Attending: Internal Medicine | Admitting: Internal Medicine

## 2020-04-05 LAB — LEGIONELLA PNEUMOPHILA SEROGP 1 UR AG: L. pneumophila Serogp 1 Ur Ag: NEGATIVE

## 2020-04-05 LAB — BASIC METABOLIC PANEL
Anion gap: 10 (ref 5–15)
BUN: 41 mg/dL — ABNORMAL HIGH (ref 8–23)
CO2: 30 mmol/L (ref 22–32)
Calcium: 7.5 mg/dL — ABNORMAL LOW (ref 8.9–10.3)
Chloride: 100 mmol/L (ref 98–111)
Creatinine, Ser: 2.57 mg/dL — ABNORMAL HIGH (ref 0.61–1.24)
GFR calc Af Amer: 26 mL/min — ABNORMAL LOW (ref >60)
GFR calc non Af Amer: 22 mL/min — ABNORMAL LOW (ref >60)
Glucose, Bld: 251 mg/dL — ABNORMAL HIGH (ref 70–99)
Potassium: 4 mmol/L (ref 3.5–5.1)
Sodium: 140 mmol/L (ref 135–145)

## 2020-04-05 LAB — URINE CULTURE: Culture: NO GROWTH

## 2020-04-05 LAB — PROCALCITONIN: Procalcitonin: 0.31 ng/mL

## 2020-04-05 LAB — GLUCOSE, CAPILLARY
Glucose-Capillary: 228 mg/dL — ABNORMAL HIGH (ref 70–99)
Glucose-Capillary: 262 mg/dL — ABNORMAL HIGH (ref 70–99)
Glucose-Capillary: 271 mg/dL — ABNORMAL HIGH (ref 70–99)
Glucose-Capillary: 339 mg/dL — ABNORMAL HIGH (ref 70–99)

## 2020-04-05 LAB — ECHOCARDIOGRAM COMPLETE
Height: 69 in
Weight: 2945.6 oz

## 2020-04-05 MED ORDER — ENOXAPARIN SODIUM 30 MG/0.3ML ~~LOC~~ SOLN
30.0000 mg | SUBCUTANEOUS | Status: DC
  Administered 2020-04-05 – 2020-04-14 (×10): 30 mg via SUBCUTANEOUS
  Filled 2020-04-05 (×10): qty 0.3

## 2020-04-05 NOTE — Plan of Care (Signed)
  Problem: Education: Goal: Knowledge of General Education information will improve Description Including pain rating scale, medication(s)/side effects and non-pharmacologic comfort measures Outcome: Progressing   

## 2020-04-05 NOTE — Progress Notes (Signed)
PROGRESS NOTE    Brett [name]  QMV:[id number] DOB: [birth year] DOA: 04/04/2020 PCP: Perrin Maltese, MD   Brief Narrative:  Brett [name] is a 85 y.o. male with medical history significant of hypertension, hyperlipidemia, diabetes mellitus, COPD on 2 L oxygen, stroke, GERD, PVD, PAD, iron deficiency anemia, dCHF, CAD, mitral valve regurgitation, aortic valve regurgitation, B-cell lymphoma (s/p of chemotherapy), atrial fibrillation not on anticoagulants, who presents with shortness breath.  He was found to be hypoxic at 72%, initially treated with BiPAP overnight, now saturating well on 4 L. He was also given IV Lasix for a possible CHF exacerbation.  Subjective: Patient has no new complaints when seen today.  Shortness of breath seems improving.  He is very hard of hearing.  Assessment & Plan:   Principal Problem:   Acute on chronic respiratory failure with hypoxemia (HCC) Active Problems:   HTN (hypertension)   CAD (coronary artery disease)   Stroke (HCC)   CAP (community acquired pneumonia)   COPD exacerbation (HCC)   Atrial fibrillation, chronic (HCC)   CHF, acute on chronic (HCC)   CKD (chronic kidney disease), stage IIIb   Type II diabetes mellitus with renal manifestations (HCC)   HLD (hyperlipidemia)   Acute on chronic diastolic CHF (congestive heart failure) (HCC)   Iron deficiency anemia  Acute on chronic respiratory failure with hypoxemia (HCC)/COPD exacerbation and possible CAP.  Respiratory status seems improving, now saturating well on 4 L, still up from his baseline of 2 L.  There was no wheezing today.  Strep pneumococcal antigen was negative.  Blood cultures negative.  Legionella pending.PCT 0.31 -Continue with bronchodilators. -Continue with ceftriaxone and azithromycin. -Continue with Solu-Medrol with daily dose today.  Acute on chronic diastolic CHF: 2D echo on 28/41/3244 showed EF of 50-55%. He was placed on Lasix 40 mg IV twice daily.  There is a  creatinine bumped to 2.57 today from 1.8.  BNP mildly elevated but patient also has CKD. -Repeat echocardiogram done-pending results. -Hold Lasix. -Continue with daily weight and strict intake and output. -Unable to obtained REDs Vest reading as patient was on BiPAP  Hypertension.  BP within goal. -Continue home medications which include amlodipine, hydralazine, metoprolol.  Hx ofCAD (coronary artery disease): no CP. Trop 16b -->16. -Continue Plavix, Crestor, Imure  Hx of Stroke [facility]): -plavix and crestor  Atrial fibrillation, chronic ([place]): Not on anticoagulants at home. -continue metoprolol  AKI with CKD (chronic kidney disease), stage IIIb.  Creatinine increased to 2.57 today most likely prerenal secondary to diuresis.  Creatinine around 1.7-1.8. -We will hold Lasix. -Continue to monitor -Avoid nephrotoxins.  Type II diabetes mellitus with renal manifestations ([place]): Most recent A1c 12.4, poorly controled. Patient is taking Amaryl at home -SSI  HLD (hyperlipidemia) -Crestor  Iron deficiency anemia: Hgb 9.8 on 12/05/19 -->9.5 . -f/u by CBC -Continue iron supplement.  Objective: Vitals:   04/05/20 0915 04/05/20 0925 04/05/20 1100 04/05/20 1106  BP:      Pulse: 79 61 71 72  Resp: 20  17 16   Temp:      TempSrc:      SpO2: 97% 93% 98% 98%  Weight:      Height:        Intake/Output Summary (Last 24 hours) at 04/05/2020 1117 Last data filed at 04/05/2020 1013 Gross per 24 hour  Intake 340.02 ml  Output 875 ml  Net -534.98 ml   Filed Weights   04/04/20 0448 04/04/20 1417 04/05/20 0340  Weight: 85.8 kg 83  kg 83.5 kg    Examination:  General exam: Appears calm and comfortable  Respiratory system: Clear to auscultation. Respiratory effort normal. Cardiovascular system: S1 & S2 heard, RRR. No JVD, murmurs, rubs, gallops or clicks. Gastrointestinal system: Soft, nontender, nondistended, bowel sounds positive. Central nervous system: Alert and oriented. No focal  neurological deficits. Extremities: Trace LE edema, no cyanosis, pulses intact and symmetrical. Psychiatry: Judgement and insight appear normal.     DVT prophylaxis: Lovenox Code Status: DNR Family Communication: No family at bedside, discussed with patient. Disposition Plan:  Status is: Inpatient  Remains inpatient appropriate because:Inpatient level of care appropriate due to severity of illness   Dispo: The patient is from: Home              Anticipated d/c is to: Home              Anticipated d/c date is: 1 day              Patient currently is not medically stable to d/c.  Consultants:   None  Procedures:  Antimicrobials:  Rocephin Azithromycin  Data Reviewed: I have personally reviewed following labs and imaging studies  CBC: Recent Labs  Lab 04/04/20 0446  WBC 15.3*  NEUTROABS 13.4*  HGB 9.5*  HCT 30.6*  MCV 88.7  PLT 627*   Basic Metabolic Panel: Recent Labs  Lab 04/04/20 0446 04/05/20 0433  NA 145 140  K 3.9 4.0  CL 104 100  CO2 30 30  GLUCOSE 207* 251*  BUN 27* 41*  CREATININE 1.80* 2.57*  CALCIUM 8.0* 7.5*   GFR: Estimated Creatinine Clearance: 21.8 mL/min (A) (by C-G formula based on SCr of 2.57 mg/dL (H)). Liver Function Tests: Recent Labs  Lab 04/04/20 0446  AST 15  ALT 17  ALKPHOS 91  BILITOT 0.8  PROT 8.0  ALBUMIN 3.5   No results for input(s): LIPASE, AMYLASE in the last 168 hours. No results for input(s): AMMONIA in the last 168 hours. Coagulation Profile: No results for input(s): INR, PROTIME in the last 168 hours. Cardiac Enzymes: No results for input(s): CKTOTAL, CKMB, CKMBINDEX, TROPONINI in the last 168 hours. BNP (last 3 results) No results for input(s): PROBNP in the last 8760 hours. HbA1C: No results for input(s): HGBA1C in the last 72 hours. CBG: Recent Labs  Lab 04/04/20 1145 04/04/20 1554 04/04/20 2053 04/05/20 0801  GLUCAP 201* 193* 291* 228*   Lipid Profile: No results for input(s): CHOL, HDL,  LDLCALC, TRIG, CHOLHDL, LDLDIRECT in the last 72 hours. Thyroid Function Tests: No results for input(s): TSH, T4TOTAL, FREET4, T3FREE, THYROIDAB in the last 72 hours. Anemia Panel: No results for input(s): VITAMINB12, FOLATE, FERRITIN, TIBC, IRON, RETICCTPCT in the last 72 hours. Sepsis Labs: Recent Labs  Lab 04/04/20 0446 04/04/20 0447 04/05/20 0433  PROCALCITON <0.10  --  0.31  LATICACIDVEN  --  1.0  --     Recent Results (from the past 240 hour(s))  Blood culture (routine x 2)     Status: None (Preliminary result)   Collection Time: 04/04/20  4:47 AM   Specimen: Right Antecubital; Blood  Result Value Ref Range Status   Specimen Description RIGHT ANTECUBITAL  Final   Special Requests   Final    BOTTLES DRAWN AEROBIC AND ANAEROBIC Blood Culture adequate volume   Culture   Final    NO GROWTH 1 DAY Performed at [facility], [address] [place], [place] [address]    Report Status PENDING  Incomplete  Respiratory Panel by RT PCR (Flu A&B, Covid) - Nasopharyngeal Swab     Status: None   Collection Time: 04/04/20  4:48 AM   Specimen: Nasopharyngeal Swab  Result Value Ref Range Status   SARS Coronavirus 2 by RT PCR NEGATIVE NEGATIVE Final    Comment: (NOTE) SARS-CoV-2 target nucleic acids are NOT DETECTED. The SARS-CoV-2 RNA is generally detectable in upper respiratoy specimens during the acute phase of infection. The lowest concentration of SARS-CoV-2 viral copies this assay can detect is 131 copies/mL. A negative result does not preclude SARS-Cov-2 infection and should not be used as the sole basis for treatment or other patient management decisions. A negative result may occur with  improper specimen collection/handling, submission of specimen other than nasopharyngeal swab, presence of viral mutation(s) within the areas targeted by this assay, and inadequate number of viral copies (<131 copies/mL). A negative result must be combined with  clinical observations, patient history, and epidemiological information. The expected result is Negative. Fact Sheet for Patients:  PinkCheek.be Fact Sheet for Healthcare Providers:  GravelBags.it This test is not yet ap proved or cleared by the [place] FDA and  has been authorized for detection and/or diagnosis of SARS-CoV-2 by FDA under an Emergency Use Authorization (EUA). This EUA will remain  in effect (meaning this test can be used) for the duration of the COVID-19 declaration under Section 564(b)(1) of the Act, 21 U.S.C. section 360bbb-3(b)(1), unless the authorization is terminated or revoked sooner.    Influenza A by PCR NEGATIVE NEGATIVE Final   Influenza B by PCR NEGATIVE NEGATIVE Final    Comment: (NOTE) The Xpert Xpress SARS-CoV-2/FLU/RSV assay is intended as an aid in  the diagnosis of influenza from Nasopharyngeal swab specimens and  should not be used as a sole basis for treatment. Nasal washings and  aspirates are unacceptable for Xpert Xpress SARS-CoV-2/FLU/RSV  testing. Fact Sheet for Patients: PinkCheek.be Fact Sheet for Healthcare Providers: GravelBags.it This test is not yet approved or cleared by the [place] FDA and  has been authorized for detection and/or diagnosis of SARS-CoV-2 by  FDA under an Emergency Use Authorization (EUA). This EUA will remain  in effect (meaning this test can be used) for the duration of the  Covid-19 declaration under Section 564(b)(1) of the Act, 21  U.S.C. section 360bbb-3(b)(1), unless the authorization is  terminated or revoked. Performed at [facility], [address] [place], [place] [address]   Blood culture (routine x 2)     Status: None (Preliminary result)   Collection Time: 04/04/20  4:52 AM   Specimen: BLOOD LEFT WRIST  Result Value Ref Range Status   Specimen Description BLOOD  LEFT WRIST  Final   Special Requests   Final    BOTTLES DRAWN AEROBIC AND ANAEROBIC Blood Culture adequate volume   Culture   Final    NO GROWTH 1 DAY Performed at [facility], [address], [place], [place] [address]    Report Status PENDING  Incomplete  Urine culture     Status: None   Collection Time: 04/04/20  6:28 AM   Specimen: Urine, Random  Result Value Ref Range Status   Specimen Description   Final    URINE, RANDOM Performed at [facility], [address], [place], [place] [address]    Special Requests   Final    NONE Performed at [facility], [address] [place], [place] [address]    Culture   Final  NO GROWTH Performed at [facility], [place] [address], [place], [place] [address]    Report Status 04/05/2020 FINAL  Final     Radiology Studies: DG Chest Port 1 View  Result Date: 04/04/2020 CLINICAL DATA:  Shortness of breath. Lower extremity edema. Hypoxia. EXAM: PORTABLE CHEST 1 VIEW COMPARISON:  One-view chest x-ray 11/30/2019. FINDINGS: Heart is enlarged. Diffuse interstitial pattern is present. Mild patchy opacities are superimposed on the right. Small effusions are suggested. Degenerative changes are noted at the right [facility] joint. IMPRESSION: 1. Cardiomegaly with diffuse interstitial pattern compatible with edema. 2. Patchy airspace disease on the right compatible with infection. Electronically Signed   By: San Morelle M.D.   On: 04/04/2020 05:08    Scheduled Meds: . amLODipine  5 mg Oral Daily  . clopidogrel  75 mg Oral Daily  . enoxaparin (LOVENOX) injection  30 mg Subcutaneous Q24H  . ferrous sulfate  325 mg Oral BID WC  . gabapentin  100 mg Oral BID  . hydrALAZINE  100 mg Oral BID  . insulin aspart  0-5 Units Subcutaneous QHS  . insulin aspart  0-9 Units Subcutaneous TID WC  . ipratropium-albuterol  3 mL Nebulization Q4H  . isosorbide mononitrate  30 mg Oral Daily  . methylPREDNISolone (SOLU-MEDROL)  injection  40 mg Intravenous Q12H  . metoprolol  200 mg Oral Daily  . pantoprazole  40 mg Oral Daily  . rosuvastatin  40 mg Oral Daily  . sodium chloride flush  3 mL Intravenous Q12H   Continuous Infusions: . sodium chloride    . azithromycin 500 mg (04/05/20 0948)  . cefTRIAXone (ROCEPHIN)  IV Stopped (04/05/20 0945)     LOS: 1 day   Time spent: 45 minutes.  Lorella Nimrod, MD [facility]  If 7PM-7AM, please contact night-coverage [email or web address]  04/05/2020, 11:17 AM   This record has been created using Systems analyst. Errors have been sought and corrected,but may not always be located. Such creation errors do not reflect on the standard of care.

## 2020-04-05 NOTE — Progress Notes (Signed)
PHARMACIST - PHYSICIAN COMMUNICATION  CONCERNING:  Enoxaparin (Lovenox) for DVT Prophylaxis    RECOMMENDATION: Patient was prescribed enoxaprin 40mg  q24 hours for VTE prophylaxis.   Filed Weights   04/04/20 0448 04/04/20 1417 04/05/20 0340  Weight: 85.8 kg (189 lb 2.5 oz) 83 kg (183 lb) 83.5 kg (184 lb 1.6 oz)    Body mass index is 27.19 kg/m.  Estimated Creatinine Clearance: 21.8 mL/min (A) (by C-G formula based on SCr of 2.57 mg/dL (H)).   Patient is candidate for enoxaparin 30mg  every 24 hours based on CrCl <78ml/min  DESCRIPTION: Pharmacy has adjusted enoxaparin dose per [facility] policy.  Patient is now receiving enoxaparin 30mg  every 24 hours.  Cheri Guppy, PharmD Clinical Pharmacist  04/05/2020 9:44 AM

## 2020-04-05 NOTE — Progress Notes (Signed)
*  PRELIMINARY RESULTS* Echocardiogram 2D Echocardiogram has been performed.  Brett [name] 04/05/2020, 9:30 AM

## 2020-04-05 NOTE — Plan of Care (Signed)
  Problem: Health Behavior/Discharge Planning: °Goal: Ability to manage health-related needs will improve °Outcome: Progressing °  °Problem: Clinical Measurements: °Goal: Respiratory complications will improve °Outcome: Progressing °  °Problem: Safety: °Goal: Ability to remain free from injury will improve °Outcome: Progressing °  °

## 2020-04-05 NOTE — Progress Notes (Signed)
Patient placed on 6l via [place]. Sat currenly 93%. Spoke both with RT and Dr. Reesa Chew both stated okay to leave on 6l, keep sat above 90%. Will continue to monitor closely

## 2020-04-06 LAB — BASIC METABOLIC PANEL
Anion gap: 12 (ref 5–15)
BUN: 71 mg/dL — ABNORMAL HIGH (ref 8–23)
CO2: 29 mmol/L (ref 22–32)
Calcium: 7.6 mg/dL — ABNORMAL LOW (ref 8.9–10.3)
Chloride: 97 mmol/L — ABNORMAL LOW (ref 98–111)
Creatinine, Ser: 3.29 mg/dL — ABNORMAL HIGH (ref 0.61–1.24)
GFR calc Af Amer: 19 mL/min — ABNORMAL LOW (ref >60)
GFR calc non Af Amer: 16 mL/min — ABNORMAL LOW (ref >60)
Glucose, Bld: 309 mg/dL — ABNORMAL HIGH (ref 70–99)
Potassium: 4.2 mmol/L (ref 3.5–5.1)
Sodium: 138 mmol/L (ref 135–145)

## 2020-04-06 LAB — GLUCOSE, CAPILLARY
Glucose-Capillary: 264 mg/dL — ABNORMAL HIGH (ref 70–99)
Glucose-Capillary: 281 mg/dL — ABNORMAL HIGH (ref 70–99)
Glucose-Capillary: 313 mg/dL — ABNORMAL HIGH (ref 70–99)
Glucose-Capillary: 319 mg/dL — ABNORMAL HIGH (ref 70–99)

## 2020-04-06 LAB — CBC
HCT: 26 % — ABNORMAL LOW (ref 39.0–52.0)
Hemoglobin: 8 g/dL — ABNORMAL LOW (ref 13.0–17.0)
MCH: 27.3 pg (ref 26.0–34.0)
MCHC: 30.8 g/dL (ref 30.0–36.0)
MCV: 88.7 fL (ref 80.0–100.0)
Platelets: 158 10*3/uL (ref 150–400)
RBC: 2.93 MIL/uL — ABNORMAL LOW (ref 4.22–5.81)
RDW: 15.2 % (ref 11.5–15.5)
WBC: 12.4 10*3/uL — ABNORMAL HIGH (ref 4.0–10.5)
nRBC: 0 % (ref 0.0–0.2)

## 2020-04-06 LAB — PROCALCITONIN: Procalcitonin: 0.24 ng/mL

## 2020-04-06 MED ORDER — INSULIN ASPART 100 UNIT/ML ~~LOC~~ SOLN
0.0000 [IU] | Freq: Three times a day (TID) | SUBCUTANEOUS | Status: DC
  Administered 2020-04-06 (×2): 8 [IU] via SUBCUTANEOUS
  Administered 2020-04-06: 11 [IU] via SUBCUTANEOUS
  Administered 2020-04-07: 5 [IU] via SUBCUTANEOUS
  Administered 2020-04-07: 3 [IU] via SUBCUTANEOUS
  Administered 2020-04-07: 8 [IU] via SUBCUTANEOUS
  Administered 2020-04-08: 5 [IU] via SUBCUTANEOUS
  Administered 2020-04-08 – 2020-04-09 (×3): 3 [IU] via SUBCUTANEOUS
  Administered 2020-04-09: 2 [IU] via SUBCUTANEOUS
  Administered 2020-04-10 – 2020-04-13 (×6): 3 [IU] via SUBCUTANEOUS
  Administered 2020-04-13: 2 [IU] via SUBCUTANEOUS
  Administered 2020-04-14: 3 [IU] via SUBCUTANEOUS
  Administered 2020-04-14: 2 [IU] via SUBCUTANEOUS
  Administered 2020-04-15: 3 [IU] via SUBCUTANEOUS
  Administered 2020-04-15: 2 [IU] via SUBCUTANEOUS
  Filled 2020-04-06 (×22): qty 1

## 2020-04-06 MED ORDER — INSULIN GLARGINE 100 UNIT/ML ~~LOC~~ SOLN
10.0000 [IU] | Freq: Two times a day (BID) | SUBCUTANEOUS | Status: DC
  Administered 2020-04-06 – 2020-04-08 (×5): 10 [IU] via SUBCUTANEOUS
  Filled 2020-04-06 (×6): qty 0.1

## 2020-04-06 MED ORDER — PREDNISONE 20 MG PO TABS
40.0000 mg | ORAL_TABLET | Freq: Every day | ORAL | Status: AC
  Administered 2020-04-06 – 2020-04-07 (×2): 40 mg via ORAL
  Filled 2020-04-06 (×2): qty 2

## 2020-04-06 MED ORDER — SODIUM CHLORIDE 0.9 % IV SOLN: INTRAVENOUS | Status: DC

## 2020-04-06 MED ORDER — POLYETHYLENE GLYCOL 3350 17 G PO PACK
17.0000 g | PACK | Freq: Every day | ORAL | Status: DC
  Administered 2020-04-08 – 2020-04-14 (×6): 17 g via ORAL
  Filled 2020-04-06 (×6): qty 1

## 2020-04-06 NOTE — Progress Notes (Addendum)
PROGRESS NOTE    Brett [name]  HDQ:[id number] DOB: 1935/08/04 DOA: 04/04/2020 PCP: Perrin Maltese, MD   Brief Narrative:  Brett [name] is a 85 y.o. male with medical history significant of hypertension, hyperlipidemia, diabetes mellitus, COPD on 2 L oxygen, stroke, GERD, PVD, PAD, iron deficiency anemia, dCHF, CAD, mitral valve regurgitation, aortic valve regurgitation, B-cell lymphoma (s/p of chemotherapy), atrial fibrillation not on anticoagulants, who presents with shortness breath.  He was found to be hypoxic at 72%, initially treated with BiPAP overnight, now saturating well on 4 L. He was also given IV Lasix for a possible CHF exacerbation.  Subjective: Patient has no new complaints when seen today.   Assessment & Plan:   Principal Problem:   Acute on chronic respiratory failure with hypoxemia (HCC) Active Problems:   HTN (hypertension)   CAD (coronary artery disease)   Stroke (HCC)   CAP (community acquired pneumonia)   COPD exacerbation (HCC)   Atrial fibrillation, chronic (HCC)   CHF, acute on chronic (HCC)   CKD (chronic kidney disease), stage IIIb   Type II diabetes mellitus with renal manifestations (HCC)   HLD (hyperlipidemia)   Acute on chronic diastolic CHF (congestive heart failure) (HCC)   Iron deficiency anemia  Acute on chronic respiratory failure with hypoxemia (HCC)/COPD exacerbation and possible CAP.  Respiratory status seems improving, now saturating well on 4 L, still up from his baseline of 2 L.  There was no wheezing today.  Strep pneumococcal antigen was negative.  Blood cultures negative.  Legionella pending.PCT 0.31>>0.24 -Continue with bronchodilators. -Continue with ceftriaxone and azithromycin. -Switch Solu-Medrol with prednisone for 2 more days.  Acute on chronic diastolic CHF: 2D echo on 11/94/1740 showed EF of 50-55%. He was placed on Lasix 40 mg IV twice daily.  There is a creatinine bumped to 2.57 today from 1.8.  BNP mildly elevated  but patient also has CKD. -Repeat echocardiogram done-.  EF of 50 to 55% with grade 2 diastolic dysfunction -Continue with daily weight and strict intake and output. - REDs Vest reading of 37.echocardiogram with more than 50% variability IVC -Patient appears dry  Hypertension.  BP within goal. -Continue home medications which include amlodipine, hydralazine, metoprolol.  Hx ofCAD (coronary artery disease): no CP. Trop 16b -->16. -Continue Plavix, Crestor, Imure  Hx of Stroke [facility]): -plavix and crestor  Atrial fibrillation, chronic ([employer]): Not on anticoagulants at home. -continue metoprolol  AKI with CKD (chronic kidney disease), stage IIIb.  Creatinine increased to 3.29 today most likely prerenal secondary to diuresis.  Creatinine around 1.7-1.8.  appears dry-gentle hydration with IV fluid. -Continue to monitor -Avoid nephrotoxins.  Type II diabetes mellitus with renal manifestations ([place]): Most recent A1c 12.4, poorly controled. Patient is taking Amaryl at home. CBG elevated as patient is on steroid. -Add Lantus 10 units twice daily. -SSI  HLD (hyperlipidemia) -Crestor  Iron deficiency anemia: Hgb 9.8 on 12/05/19 -->9.5 . -f/u by CBC -Continue iron supplement.  Objective: Vitals:   04/06/20 0432 04/06/20 0734 04/06/20 1127 04/06/20 1132  BP: (!) 120/56 (!) 120/56 (!) 111/55   Pulse: 64 65 75   Resp: 20 16 16    Temp: 98 F (36.7 C) 98.3 F (36.8 C) 97.9 F (36.6 C)   TempSrc: Oral Oral Oral   SpO2: 98% 93% 92% 91%  Weight: 85.9 kg     Height:        Intake/Output Summary (Last 24 hours) at 04/06/2020 1426 Last data filed at 04/06/2020 1330 Gross per 24  hour  Intake 1330.91 ml  Output 1250 ml  Net 80.91 ml   Filed Weights   04/04/20 1417 04/05/20 0340 04/06/20 0432  Weight: 83 kg 83.5 kg 85.9 kg    Examination:  General exam: Appears calm and comfortable  Respiratory system: Clear to auscultation. Respiratory effort normal. Cardiovascular system: S1 &  S2 heard, RRR. No JVD, murmurs, rubs, gallops or clicks. Gastrointestinal system: Soft, nontender, nondistended, bowel sounds positive. Central nervous system: Alert and oriented. No focal neurological deficits. Extremities: Trace LE edema, no cyanosis, pulses intact and symmetrical. Psychiatry: Judgement and insight appear normal.     DVT prophylaxis: Lovenox Code Status: DNR Family Communication: POA Claudette Head was updated on phone.  He lives with patient. Disposition Plan:  Status is: Inpatient  Remains inpatient appropriate because:Inpatient level of care appropriate due to severity of illness   Dispo: The patient is from: Home              Anticipated d/c is to: Home              Anticipated d/c date is: 1 day              Patient currently is not medically stable to d/c.  Worsening renal function, giving some gentle IV fluid.  Patient has used his rehab days earlier this year. Will need home health on discharge.  Consultants:   None  Procedures:  Antimicrobials:  Rocephin Azithromycin  Data Reviewed: I have personally reviewed following labs and imaging studies  CBC: Recent Labs  Lab 04/04/20 0446 04/06/20 0447  WBC 15.3* 12.4*  NEUTROABS 13.4*  --   HGB 9.5* 8.0*  HCT 30.6* 26.0*  MCV 88.7 88.7  PLT 146* 096   Basic Metabolic Panel: Recent Labs  Lab 04/04/20 0446 04/05/20 0433 04/06/20 0447  NA 145 140 138  K 3.9 4.0 4.2  CL 104 100 97*  CO2 30 30 29   GLUCOSE 207* 251* 309*  BUN 27* 41* 71*  CREATININE 1.80* 2.57* 3.29*  CALCIUM 8.0* 7.5* 7.6*   GFR: Estimated Creatinine Clearance: 18.5 mL/min (A) (by C-G formula based on SCr of 3.29 mg/dL (H)). Liver Function Tests: Recent Labs  Lab 04/04/20 0446  AST 15  ALT 17  ALKPHOS 91  BILITOT 0.8  PROT 8.0  ALBUMIN 3.5   No results for input(s): LIPASE, AMYLASE in the last 168 hours. No results for input(s): AMMONIA in the last 168 hours. Coagulation Profile: No results for input(s): INR,  PROTIME in the last 168 hours. Cardiac Enzymes: No results for input(s): CKTOTAL, CKMB, CKMBINDEX, TROPONINI in the last 168 hours. BNP (last 3 results) No results for input(s): PROBNP in the last 8760 hours. HbA1C: No results for input(s): HGBA1C in the last 72 hours. CBG: Recent Labs  Lab 04/05/20 1147 04/05/20 1656 04/05/20 2050 04/06/20 0733 04/06/20 1126  GLUCAP 262* 271* 339* 264* 281*   Lipid Profile: No results for input(s): CHOL, HDL, LDLCALC, TRIG, CHOLHDL, LDLDIRECT in the last 72 hours. Thyroid Function Tests: No results for input(s): TSH, T4TOTAL, FREET4, T3FREE, THYROIDAB in the last 72 hours. Anemia Panel: No results for input(s): VITAMINB12, FOLATE, FERRITIN, TIBC, IRON, RETICCTPCT in the last 72 hours. Sepsis Labs: Recent Labs  Lab 04/04/20 0446 04/04/20 0447 04/05/20 0433 04/06/20 0447  PROCALCITON <0.10  --  0.31 0.24  LATICACIDVEN  --  1.0  --   --     Recent Results (from the past 240 hour(s))  Blood culture (routine x 2)  Status: None (Preliminary result)   Collection Time: 04/04/20  4:47 AM   Specimen: Right Antecubital; Blood  Result Value Ref Range Status   Specimen Description RIGHT ANTECUBITAL  Final   Special Requests   Final    BOTTLES DRAWN AEROBIC AND ANAEROBIC Blood Culture adequate volume   Culture   Final    NO GROWTH 2 DAYS Performed at [facility], [address] [place], [place] [address]    Report Status PENDING  Incomplete  Respiratory Panel by RT PCR (Flu A&B, Covid) - Nasopharyngeal Swab     Status: None   Collection Time: 04/04/20  4:48 AM   Specimen: Nasopharyngeal Swab  Result Value Ref Range Status   SARS Coronavirus 2 by RT PCR NEGATIVE NEGATIVE Final    Comment: (NOTE) SARS-CoV-2 target nucleic acids are NOT DETECTED. The SARS-CoV-2 RNA is generally detectable in upper respiratoy specimens during the acute phase of infection. The lowest concentration of SARS-CoV-2 viral copies this assay can detect  is 131 copies/mL. A negative result does not preclude SARS-Cov-2 infection and should not be used as the sole basis for treatment or other patient management decisions. A negative result may occur with  improper specimen collection/handling, submission of specimen other than nasopharyngeal swab, presence of viral mutation(s) within the areas targeted by this assay, and inadequate number of viral copies (<131 copies/mL). A negative result must be combined with clinical observations, patient history, and epidemiological information. The expected result is Negative. Fact Sheet for Patients:  PinkCheek.be Fact Sheet for Healthcare Providers:  GravelBags.it This test is not yet ap proved or cleared by the [place] FDA and  has been authorized for detection and/or diagnosis of SARS-CoV-2 by FDA under an Emergency Use Authorization (EUA). This EUA will remain  in effect (meaning this test can be used) for the duration of the COVID-19 declaration under Section 564(b)(1) of the Act, 21 U.S.C. section 360bbb-3(b)(1), unless the authorization is terminated or revoked sooner.    Influenza A by PCR NEGATIVE NEGATIVE Final   Influenza B by PCR NEGATIVE NEGATIVE Final    Comment: (NOTE) The Xpert Xpress SARS-CoV-2/FLU/RSV assay is intended as an aid in  the diagnosis of influenza from Nasopharyngeal swab specimens and  should not be used as a sole basis for treatment. Nasal washings and  aspirates are unacceptable for Xpert Xpress SARS-CoV-2/FLU/RSV  testing. Fact Sheet for Patients: PinkCheek.be Fact Sheet for Healthcare Providers: GravelBags.it This test is not yet approved or cleared by the [place] FDA and  has been authorized for detection and/or diagnosis of SARS-CoV-2 by  FDA under an Emergency Use Authorization (EUA). This EUA will remain  in effect (meaning this  test can be used) for the duration of the  Covid-19 declaration under Section 564(b)(1) of the Act, 21  U.S.C. section 360bbb-3(b)(1), unless the authorization is  terminated or revoked. Performed at [facility], [address] [place], [place] [address]   Blood culture (routine x 2)     Status: None (Preliminary result)   Collection Time: 04/04/20  4:52 AM   Specimen: BLOOD LEFT WRIST  Result Value Ref Range Status   Specimen Description BLOOD LEFT WRIST  Final   Special Requests   Final    BOTTLES DRAWN AEROBIC AND ANAEROBIC Blood Culture adequate volume   Culture   Final    NO GROWTH 2 DAYS Performed at [facility], [address], [place], [place] [address]    Report Status PENDING  Incomplete  Urine culture     Status: None   Collection Time: 04/04/20  6:28 AM   Specimen: Urine, Random  Result Value Ref Range Status   Specimen Description   Final    URINE, RANDOM Performed at [facility], [address] [place], [place] [address]    Special Requests   Final    NONE Performed at [facility], [address], [place], [place] [address]    Culture   Final    NO GROWTH Performed at [facility], [place] [address] [place], [place] [address]    Report Status 04/05/2020 FINAL  Final     Radiology Studies: ECHOCARDIOGRAM COMPLETE  Result Date: 04/05/2020    ECHOCARDIOGRAM REPORT   Patient Name:   ADONYS WILDES Date of Exam: 04/05/2020 Medical Rec #:  [id number]         Height:       69.0 in Accession #:    [id number]        Weight:       184.1 lb Date of Birth:  Nov 13, 1935         BSA:          1.995 m Patient Age:    89 years          BP:           119/48 mmHg Patient Gender: M                 HR:           61 bpm. Exam Location:  [facility] Procedure: 2D Echo, Cardiac Doppler and Color Doppler Indications:     CHF- acute diastolic 106.26  History:         Patient has prior history of Echocardiogram examinations, most                   recent 11/26/2019. CHF; Risk Factors:Hypertension and Diabetes.                  AI, PVD.  Sonographer:     Sherrie Sport RDCS (AE) Referring Phys:  [id number] Soledad Gerlach NIU Diagnosing Phys: Neoma Laming MD IMPRESSIONS  1. Left ventricular ejection fraction, by estimation, is 50 to 55%. The left ventricle has low normal function. The left ventricle demonstrates global hypokinesis. Left ventricular diastolic parameters are consistent with Grade II diastolic dysfunction (pseudonormalization).  2. Right ventricular systolic function is mildly reduced. The right ventricular size is moderately enlarged. There is moderately elevated pulmonary artery systolic pressure.  3. Left atrial size was mild to moderately dilated.  4. Right atrial size was mild to moderately dilated.  5. The mitral valve is grossly normal. Trivial mitral valve regurgitation. No evidence of mitral stenosis.  6. The aortic valve is abnormal. Aortic valve regurgitation is trivial. Moderate aortic valve stenosis.  7. The inferior vena cava is normal in size with greater than 50% respiratory variability, suggesting right atrial pressure of 3 mmHg. FINDINGS  Left Ventricle: Left ventricular ejection fraction, by estimation, is 50 to 55%. The left ventricle has low normal function. The left ventricle demonstrates global hypokinesis. The left ventricular internal cavity size was normal in size. There is no left ventricular hypertrophy. Left ventricular diastolic parameters are consistent with Grade II diastolic dysfunction (pseudonormalization). Right Ventricle: The right ventricular size is moderately enlarged. No increase in right ventricular wall thickness. Right ventricular systolic function is mildly reduced. There is moderately elevated pulmonary artery systolic pressure. The tricuspid regurgitant  velocity is 3.38 m/s, and with an assumed right atrial pressure of 10 mmHg, the estimated right ventricular systolic pressure is 09.4 mmHg. Left Atrium: Left atrial size  was mild to moderately dilated. Right Atrium: Right atrial size was mild to moderately dilated. Pericardium: There is no evidence of pericardial effusion. Mitral Valve: The mitral valve is grossly normal. Normal mobility of the mitral valve leaflets. Severe mitral annular calcification. Trivial mitral valve regurgitation. No evidence of mitral valve stenosis. Tricuspid Valve: The tricuspid valve is normal in structure. Tricuspid valve regurgitation is mild . No evidence of tricuspid stenosis. Aortic Valve: The aortic valve is abnormal. Aortic valve regurgitation is trivial. Moderate aortic stenosis is present. Aortic valve mean gradient measures 18.7 mmHg. Aortic valve peak gradient measures 30.0 mmHg. Aortic valve area, by VTI measures 1.21 cm. Pulmonic Valve: The pulmonic valve was normal in structure. Pulmonic valve regurgitation is trivial. No evidence of pulmonic stenosis. Aorta: The aortic root is normal in size and structure. Venous: The inferior vena cava is normal in size with greater than 50% respiratory variability, suggesting right atrial pressure of 3 mmHg. IAS/Shunts: No atrial level shunt detected by color flow Doppler.  LEFT VENTRICLE PLAX 2D LVIDd:         4.37 cm  Diastology LVIDs:         3.14 cm  LV e' lateral:   5.00 cm/s LV PW:         1.41 cm  LV E/e' lateral: 27.4 LV IVS:        0.93 cm  LV e' medial:    5.66 cm/s LVOT diam:     2.20 cm  LV E/e' medial:  24.2 LV SV:         84 LV SV Index:   42 LVOT Area:     3.80 cm  RIGHT VENTRICLE RV Basal diam:  4.46 cm RV S prime:     11.10 cm/s TAPSE (M-mode): 3.4 cm LEFT ATRIUM             Index       RIGHT ATRIUM           Index LA diam:        3.70 cm 1.86 cm/m  RA Area:     24.70 cm LA Vol (A2C):   69.3 ml 34.75 ml/m RA Volume:   83.70 ml  41.96 ml/m LA Vol (A4C):   51.6 ml 25.87 ml/m LA Biplane Vol: 63.5 ml 31.84 ml/m  AORTIC VALVE                    PULMONIC VALVE AV Area (Vmax):    1.32 cm     PV Vmax:        0.79 m/s AV Area (Vmean):    1.21 cm     PV Peak grad:   2.5 mmHg AV Area (VTI):     1.21 cm     RVOT Peak grad: 2 mmHg AV Vmax:           273.67 cm/s AV Vmean:          204.333 cm/s AV VTI:            0.698 m AV Peak Grad:      30.0 mmHg AV Mean Grad:      18.7 mmHg LVOT Vmax:         95.30 cm/s LVOT Vmean:        65.000 cm/s LVOT VTI:  0.222 m LVOT/AV VTI ratio: 0.32  AORTA Ao Root diam: 3.40 cm MITRAL VALVE                TRICUSPID VALVE MV Area (PHT): 2.97 cm     TR Peak grad:   45.7 mmHg MV Decel Time: 255 msec     TR Vmax:        338.00 cm/s MV E velocity: 137.00 cm/s MV A velocity: 102.00 cm/s  SHUNTS MV E/A ratio:  1.34         Systemic VTI:  0.22 m                             Systemic Diam: 2.20 cm Neoma Laming MD Electronically signed by Neoma Laming MD Signature Date/Time: 04/05/2020/6:11:17 PM    Final     Scheduled Meds: . amLODipine  5 mg Oral Daily  . clopidogrel  75 mg Oral Daily  . enoxaparin (LOVENOX) injection  30 mg Subcutaneous Q24H  . ferrous sulfate  325 mg Oral BID WC  . gabapentin  100 mg Oral BID  . hydrALAZINE  100 mg Oral BID  . insulin aspart  0-15 Units Subcutaneous TID WC  . insulin aspart  0-5 Units Subcutaneous QHS  . insulin glargine  10 Units Subcutaneous BID  . ipratropium-albuterol  3 mL Nebulization Q4H  . isosorbide mononitrate  30 mg Oral Daily  . metoprolol  200 mg Oral Daily  . pantoprazole  40 mg Oral Daily  . predniSONE  40 mg Oral Q breakfast  . rosuvastatin  40 mg Oral Daily  . sodium chloride flush  3 mL Intravenous Q12H   Continuous Infusions: . sodium chloride    . sodium chloride 75 mL/hr at 04/06/20 0847  . cefTRIAXone (ROCEPHIN)  IV 1 g (04/06/20 0849)     LOS: 2 days   Time spent: 40 minutes.  Lorella Nimrod, MD [facility]  If 7PM-7AM, please contact night-coverage [email or web address]  04/06/2020, 2:26 PM   This record has been created using Systems analyst. Errors have been sought and corrected,but may not always be located.  Such creation errors do not reflect on the standard of care.

## 2020-04-07 ENCOUNTER — Inpatient Hospital Stay: Payer: Medicare PPO

## 2020-04-07 LAB — BASIC METABOLIC PANEL
Anion gap: 11 (ref 5–15)
BUN: 85 mg/dL — ABNORMAL HIGH (ref 8–23)
CO2: 28 mmol/L (ref 22–32)
Calcium: 7.1 mg/dL — ABNORMAL LOW (ref 8.9–10.3)
Chloride: 103 mmol/L (ref 98–111)
Creatinine, Ser: 3.33 mg/dL — ABNORMAL HIGH (ref 0.61–1.24)
GFR calc Af Amer: 19 mL/min — ABNORMAL LOW (ref >60)
GFR calc non Af Amer: 16 mL/min — ABNORMAL LOW (ref >60)
Glucose, Bld: 230 mg/dL — ABNORMAL HIGH (ref 70–99)
Potassium: 4.3 mmol/L (ref 3.5–5.1)
Sodium: 142 mmol/L (ref 135–145)

## 2020-04-07 LAB — GLUCOSE, CAPILLARY
Glucose-Capillary: 196 mg/dL — ABNORMAL HIGH (ref 70–99)
Glucose-Capillary: 216 mg/dL — ABNORMAL HIGH (ref 70–99)
Glucose-Capillary: 272 mg/dL — ABNORMAL HIGH (ref 70–99)
Glucose-Capillary: 343 mg/dL — ABNORMAL HIGH (ref 70–99)

## 2020-04-07 LAB — CBC
HCT: 23.3 % — ABNORMAL LOW (ref 39.0–52.0)
Hemoglobin: 7.2 g/dL — ABNORMAL LOW (ref 13.0–17.0)
MCH: 27.3 pg (ref 26.0–34.0)
MCHC: 30.9 g/dL (ref 30.0–36.0)
MCV: 88.3 fL (ref 80.0–100.0)
Platelets: 167 10*3/uL (ref 150–400)
RBC: 2.64 MIL/uL — ABNORMAL LOW (ref 4.22–5.81)
RDW: 15.5 % (ref 11.5–15.5)
WBC: 11.8 10*3/uL — ABNORMAL HIGH (ref 4.0–10.5)
nRBC: 0.2 % (ref 0.0–0.2)

## 2020-04-07 LAB — PROTEIN / CREATININE RATIO, URINE
Creatinine, Urine: 66 mg/dL
Protein Creatinine Ratio: 1.24 mg/mg{creat} — ABNORMAL HIGH (ref 0.00–0.15)
Total Protein, Urine: 82 mg/dL

## 2020-04-07 MED ORDER — SODIUM CHLORIDE 0.9 % IV SOLN: INTRAVENOUS | Status: DC

## 2020-04-07 NOTE — Progress Notes (Signed)
Updated POA, Claudette Head.

## 2020-04-07 NOTE — Care Management Important Message (Signed)
Important Message  Patient Details  Name: Brett [name] MRN: [id number] Date of Birth: 1935/10/04   Medicare Important Message Given:  Yes     Dannette Barbara 04/07/2020, 12:23 PM

## 2020-04-07 NOTE — Progress Notes (Signed)
PROGRESS NOTE    Brett [name]  NUU:[id number] DOB: [birth year] DOA: 04/04/2020 PCP: Perrin Maltese, MD   Brief Narrative:  Brett [name] is a 85 y.o. male with medical history significant of hypertension, hyperlipidemia, diabetes mellitus, COPD on 2 L oxygen, stroke, GERD, PVD, PAD, iron deficiency anemia, dCHF, CAD, mitral valve regurgitation, aortic valve regurgitation, B-cell lymphoma (s/p of chemotherapy), atrial fibrillation not on anticoagulants, who presents with shortness breath.  He was found to be hypoxic at 72%, initially treated with BiPAP overnight, now saturating well on 4 L. He was also given IV Lasix for a possible CHF exacerbation.  Subjective: Patient has no new complaints .  Assessment & Plan:   Principal Problem:   Acute on chronic respiratory failure with hypoxemia (HCC) Active Problems:   HTN (hypertension)   CAD (coronary artery disease)   Stroke (HCC)   CAP (community acquired pneumonia)   COPD exacerbation (HCC)   Atrial fibrillation, chronic (HCC)   CHF, acute on chronic (HCC)   CKD (chronic kidney disease), stage IIIb   Type II diabetes mellitus with renal manifestations (HCC)   HLD (hyperlipidemia)   Acute on chronic diastolic CHF (congestive heart failure) (HCC)   Iron deficiency anemia  Acute on chronic respiratory failure with hypoxemia (HCC)/COPD exacerbation and possible CAP.  Respiratory status seems improving, now saturating well on 4 L, still up from his baseline of 2 L.  There was no wheezing today.  Strep pneumococcal antigen was negative.  Blood cultures negative.  Legionella negative.PCT 0.31>>0.24 -Continue with bronchodilators. -Continue with ceftriaxone and azithromycin. -Continue prednisone for 1 more day.  Acute on chronic diastolic CHF: 2D echo on 34/74/2595 showed EF of 50-55%. He was placed on Lasix 40 mg IV twice daily.  There is a creatinine bumped to 2.57 today from 1.8.  BNP mildly elevated but patient also has  CKD. -Repeat echocardiogram done-.  EF of 50 to 55% with grade 2 diastolic dysfunction -Continue with daily weight and strict intake and output. - REDs Vest reading of 37.echocardiogram with more than 50% variability IVC -Patient appears dry.-Keep holding diuretics.  Hypertension.  BP within goal. -Continue home medications which include amlodipine, hydralazine, metoprolol.  Hx ofCAD (coronary artery disease): no CP. Trop 16b -->16. -Continue Plavix, Crestor, Imure  Hx of Stroke [facility]): -plavix and crestor  Atrial fibrillation, chronic ([place]): Not on anticoagulants at home. -continue metoprolol  AKI with CKD (chronic kidney disease), stage IIIb.  Creatinine increased to 3.29 today most likely prerenal secondary to diuresis.  Creatinine around 1.7-1.8.  appears dry-gentle hydration with IV fluid. -Nephrology was consulted today as renal function continued to get worsen despite with gentle hydration-they are recommending continuation of gentle hydration. -Suspecting cardiorenal syndrome. -Continue to monitor -Avoid nephrotoxins. -Palliative care consult.  Type II diabetes mellitus with renal manifestations ([place]): Most recent A1c 12.4, poorly controled. Patient is taking Amaryl at home. CBG elevated as patient is on steroid. -Add Lantus 10 units twice daily. -SSI  HLD (hyperlipidemia) -Crestor  Iron deficiency anemia: Hgb 9.8 on 12/05/19 -->9.5 >>8.0>>7.2 -f/u by CBC -Continue iron supplement. -Transfuse if below 7.  Objective: Vitals:   04/07/20 0500 04/07/20 0750 04/07/20 0758 04/07/20 1237  BP:   125/61 (!) 118/59  Pulse:   71 83  Resp:   17 11  Temp: 98.2 F (36.8 C)  98.5 F (36.9 C) 98.2 F (36.8 C)  TempSrc: Oral  Oral Oral  SpO2:  92% 97% 95%  Weight:  Height:        Intake/Output Summary (Last 24 hours) at 04/07/2020 1534 Last data filed at 04/07/2020 1449 Gross per 24 hour  Intake 2475.46 ml  Output 1775 ml  Net 700.46 ml   Filed Weights    04/05/20 0340 04/06/20 0432 04/07/20 0422  Weight: 83.5 kg 85.9 kg 86.7 kg    Examination:  General exam: Appears calm and comfortable  Respiratory system: Clear to auscultation. Respiratory effort normal. Cardiovascular system: S1 & S2 heard, RRR. No JVD, murmurs, rubs, gallops or clicks. Gastrointestinal system: Soft, nontender, nondistended, bowel sounds positive. Central nervous system: Alert and oriented. No focal neurological deficits. Extremities: Trace LE edema, no cyanosis, pulses intact and symmetrical. Psychiatry: Judgement and insight appear normal.     DVT prophylaxis: Lovenox Code Status: DNR Family Communication: POA Brett [name] was updated on phone.  He lives with patient. Disposition Plan:  Status is: Inpatient  Remains inpatient appropriate because:Inpatient level of care appropriate due to severity of illness   Dispo: The patient is from: Home              Anticipated d/c is to: Home              Anticipated d/c date is: 1 day              Patient currently is not medically stable to d/c.  Worsening renal function, giving some gentle IV fluid.  Patient has used his rehab days earlier this year. Will need home health on discharge.  Suspecting cardiorenal syndrome.  Palliative care was consulted today.  Consultants:   None  Procedures:  Antimicrobials:  Rocephin Azithromycin  Data Reviewed: I have personally reviewed following labs and imaging studies  CBC: Recent Labs  Lab 04/04/20 0446 04/06/20 0447 04/07/20 0438  WBC 15.3* 12.4* 11.8*  NEUTROABS 13.4*  --   --   HGB 9.5* 8.0* 7.2*  HCT 30.6* 26.0* 23.3*  MCV 88.7 88.7 88.3  PLT 146* 158 433   Basic Metabolic Panel: Recent Labs  Lab 04/04/20 0446 04/05/20 0433 04/06/20 0447 04/07/20 0438  NA 145 140 138 142  K 3.9 4.0 4.2 4.3  CL 104 100 97* 103  CO2 30 30 29 28   GLUCOSE 207* 251* 309* 230*  BUN 27* 41* 71* 85*  CREATININE 1.80* 2.57* 3.29* 3.33*  CALCIUM 8.0* 7.5* 7.6* 7.1*    GFR: Estimated Creatinine Clearance: 18.3 mL/min (A) (by C-G formula based on SCr of 3.33 mg/dL (H)). Liver Function Tests: Recent Labs  Lab 04/04/20 0446  AST 15  ALT 17  ALKPHOS 91  BILITOT 0.8  PROT 8.0  ALBUMIN 3.5   No results for input(s): LIPASE, AMYLASE in the last 168 hours. No results for input(s): AMMONIA in the last 168 hours. Coagulation Profile: No results for input(s): INR, PROTIME in the last 168 hours. Cardiac Enzymes: No results for input(s): CKTOTAL, CKMB, CKMBINDEX, TROPONINI in the last 168 hours. BNP (last 3 results) No results for input(s): PROBNP in the last 8760 hours. HbA1C: No results for input(s): HGBA1C in the last 72 hours. CBG: Recent Labs  Lab 04/06/20 1126 04/06/20 1621 04/06/20 2101 04/07/20 0759 04/07/20 1136  GLUCAP 281* 313* 319* 196* 216*   Lipid Profile: No results for input(s): CHOL, HDL, LDLCALC, TRIG, CHOLHDL, LDLDIRECT in the last 72 hours. Thyroid Function Tests: No results for input(s): TSH, T4TOTAL, FREET4, T3FREE, THYROIDAB in the last 72 hours. Anemia Panel: No results for input(s): VITAMINB12, FOLATE, FERRITIN, TIBC, IRON, RETICCTPCT  in the last 72 hours. Sepsis Labs: Recent Labs  Lab 04/04/20 0446 04/04/20 0447 04/05/20 0433 04/06/20 0447  PROCALCITON <0.10  --  0.31 0.24  LATICACIDVEN  --  1.0  --   --     Recent Results (from the past 240 hour(s))  Blood culture (routine x 2)     Status: None (Preliminary result)   Collection Time: 04/04/20  4:47 AM   Specimen: Right Antecubital; Blood  Result Value Ref Range Status   Specimen Description RIGHT ANTECUBITAL  Final   Special Requests   Final    BOTTLES DRAWN AEROBIC AND ANAEROBIC Blood Culture adequate volume   Culture   Final    NO GROWTH 3 DAYS Performed at [facility], [address] [place], [place] [address]    Report Status PENDING  Incomplete  Respiratory Panel by RT PCR (Flu A&B, Covid) - Nasopharyngeal Swab     Status: None    Collection Time: 04/04/20  4:48 AM   Specimen: Nasopharyngeal Swab  Result Value Ref Range Status   SARS Coronavirus 2 by RT PCR NEGATIVE NEGATIVE Final    Comment: (NOTE) SARS-CoV-2 target nucleic acids are NOT DETECTED. The SARS-CoV-2 RNA is generally detectable in upper respiratoy specimens during the acute phase of infection. The lowest concentration of SARS-CoV-2 viral copies this assay can detect is 131 copies/mL. A negative result does not preclude SARS-Cov-2 infection and should not be used as the sole basis for treatment or other patient management decisions. A negative result may occur with  improper specimen collection/handling, submission of specimen other than nasopharyngeal swab, presence of viral mutation(s) within the areas targeted by this assay, and inadequate number of viral copies (<131 copies/mL). A negative result must be combined with clinical observations, patient history, and epidemiological information. The expected result is Negative. Fact Sheet for Patients:  PinkCheek.be Fact Sheet for Healthcare Providers:  GravelBags.it This test is not yet ap proved or cleared by the [place] FDA and  has been authorized for detection and/or diagnosis of SARS-CoV-2 by FDA under an Emergency Use Authorization (EUA). This EUA will remain  in effect (meaning this test can be used) for the duration of the COVID-19 declaration under Section 564(b)(1) of the Act, 21 U.S.C. section 360bbb-3(b)(1), unless the authorization is terminated or revoked sooner.    Influenza A by PCR NEGATIVE NEGATIVE Final   Influenza B by PCR NEGATIVE NEGATIVE Final    Comment: (NOTE) The Xpert Xpress SARS-CoV-2/FLU/RSV assay is intended as an aid in  the diagnosis of influenza from Nasopharyngeal swab specimens and  should not be used as a sole basis for treatment. Nasal washings and  aspirates are unacceptable for Xpert Xpress  SARS-CoV-2/FLU/RSV  testing. Fact Sheet for Patients: PinkCheek.be Fact Sheet for Healthcare Providers: GravelBags.it This test is not yet approved or cleared by the [place] FDA and  has been authorized for detection and/or diagnosis of SARS-CoV-2 by  FDA under an Emergency Use Authorization (EUA). This EUA will remain  in effect (meaning this test can be used) for the duration of the  Covid-19 declaration under Section 564(b)(1) of the Act, 21  U.S.C. section 360bbb-3(b)(1), unless the authorization is  terminated or revoked. Performed at [facility], [address] [place], [place] [address]   Blood culture (routine x 2)     Status: None (Preliminary result)   Collection Time: 04/04/20  4:52 AM   Specimen: BLOOD LEFT WRIST  Result Value Ref Range Status  Specimen Description BLOOD LEFT WRIST  Final   Special Requests   Final    BOTTLES DRAWN AEROBIC AND ANAEROBIC Blood Culture adequate volume   Culture   Final    NO GROWTH 3 DAYS Performed at [facility], [address] [place], [place] [address]    Report Status PENDING  Incomplete  Urine culture     Status: None   Collection Time: 04/04/20  6:28 AM   Specimen: Urine, Random  Result Value Ref Range Status   Specimen Description   Final    URINE, RANDOM Performed at [facility], [address], [place], [place] [address]    Special Requests   Final    NONE Performed at [facility], [address], [place], [place] [address]    Culture   Final    NO GROWTH Performed at [facility], [place] [address] [place], [place] [address]    Report Status 04/05/2020 FINAL  Final     Radiology Studies: US RENAL  Result Date: 04/07/2020 CLINICAL DATA:  AKI EXAM: RENAL / URINARY TRACT ULTRASOUND COMPLETE COMPARISON:  None. FINDINGS: Right Kidney: Renal measurements: 10.2 x 5 x 4.5 cm = volume: 120.4 mL . Echogenicity  within normal limits. Two small cysts. No solid mass or hydronephrosis visualized. Left Kidney: Renal measurements: 11.1 x 5.8 x 4.7 cm = volume: 155.8 mL. Echogenicity within normal limits. No mass or hydronephrosis visualized. Bladder: Appears normal for degree of bladder distention. Other: None. IMPRESSION: No hydronephrosis. Electronically Signed   By: Macy Mis M.D.   On: 04/07/2020 09:31    Scheduled Meds: . amLODipine  5 mg Oral Daily  . clopidogrel  75 mg Oral Daily  . enoxaparin (LOVENOX) injection  30 mg Subcutaneous Q24H  . ferrous sulfate  325 mg Oral BID WC  . gabapentin  100 mg Oral BID  . hydrALAZINE  100 mg Oral BID  . insulin aspart  0-15 Units Subcutaneous TID WC  . insulin aspart  0-5 Units Subcutaneous QHS  . insulin glargine  10 Units Subcutaneous BID  . ipratropium-albuterol  3 mL Nebulization Q4H  . isosorbide mononitrate  30 mg Oral Daily  . metoprolol  200 mg Oral Daily  . pantoprazole  40 mg Oral Daily  . polyethylene glycol  17 g Oral Daily  . rosuvastatin  40 mg Oral Daily  . sodium chloride flush  3 mL Intravenous Q12H   Continuous Infusions: . sodium chloride    . sodium chloride 40 mL/hr at 04/07/20 1445  . cefTRIAXone (ROCEPHIN)  IV Stopped (04/07/20 1035)     LOS: 3 days   Time spent: 40 minutes.  Lorella Nimrod, MD [facility]  If 7PM-7AM, please contact night-coverage [email or web address]  04/07/2020, 3:34 PM   This record has been created using Systems analyst. Errors have been sought and corrected,but may not always be located. Such creation errors do not reflect on the standard of care.

## 2020-04-07 NOTE — Plan of Care (Signed)
  Problem: Health Behavior/Discharge Planning: Goal: Ability to manage health-related needs will improve Outcome: Progressing   Problem: Clinical Measurements: Goal: Diagnostic test results will improve Outcome: Progressing Goal: Respiratory complications will improve Outcome: Progressing   Problem: Safety: Goal: Ability to remain free from injury will improve Outcome: Progressing

## 2020-04-07 NOTE — Consult Note (Signed)
[facility] CONSULT NOTE    Date: 04/07/2020                  Patient Name:  Brett [name]  MRN: [id number]  DOB: June 29, 1935  Age / Sex: 85 y.o., male         PCP: Perrin Maltese, MD                 Service Requesting Consult:  Hospitalist                 Reason for Consult:  Acute kidney injury/chronic kidney disease stage IIIb            History of Present Illness: Patient is a 85 y.o. male with a PMHx of B-cell lymphoma, aortic regurgitation, coronary artery disease, carotid artery stenosis, congestive heart failure, diabetes mellitus type 2, COPD, hypertension, hyperlipidemia, iron deficiency anemia, peptic ulcer disease, peripheral vascular disease, who was admitted to [facility] on 04/04/2020 for evaluation of shortness of breath.  Patient is extremely hard of hearing and had some difficulty relating history of present illness.  It appears that he presented with shortness of breath.  Found to have an oxygen desaturation to 72% on room air.  Currently on nasal cannula.  We are asked to see him for evaluation management of acute kidney injury in the setting of known chronic kidney disease stage IIIb baseline creatinine 1.79 with an EGFR of 35. It was felt that there may be some component of pulmonary edema therefore he was treated with Lasix.  However his renal function worsened on this regimen.  EF appears to be fairly well-preserved.  BNP slightly high at 591.  Medications: Outpatient medications: Medications Prior to Admission  Medication Sig Dispense Refill Last Dose  . amLODipine (NORVASC) 5 MG tablet Take 5 mg by mouth daily.      . clopidogrel (PLAVIX) 75 MG tablet Take 75 mg by mouth daily.      . furosemide (LASIX) 20 MG tablet Take 20 mg by mouth daily.     Marland Kitchen gabapentin (NEURONTIN) 100 MG capsule Take 1 capsule by mouth 2 (two) times daily.      Marland Kitchen glimepiride (AMARYL) 2 MG tablet Take 1 tablet (2 mg total) by mouth daily with breakfast. (Patient taking  differently: Take 4 mg by mouth 2 (two) times daily. ) 30 tablet 0   . hydrALAZINE (APRESOLINE) 100 MG tablet Take 100 mg by mouth 2 (two) times daily.     . isosorbide mononitrate (IMDUR) 30 MG 24 hr tablet Take 30 mg by mouth daily.     . metoprolol (TOPROL-XL) 200 MG 24 hr tablet Take 200 mg by mouth daily.     . pantoprazole (PROTONIX) 40 MG tablet Take 40 mg by mouth daily.     . rosuvastatin (CRESTOR) 40 MG tablet Take 40 mg by mouth daily.     . Vitamin D, Ergocalciferol, (DRISDOL) 1.25 MG (50000 UT) CAPS capsule Take 50,000 Units by mouth once a week.     Marland Kitchen albuterol (PROVENTIL HFA;VENTOLIN HFA) 108 (90 Base) MCG/ACT inhaler Inhale 2 puffs into the lungs every 6 (six) hours as needed for wheezing or shortness of breath. 1 Inhaler 0 PRN at PRN  . amiodarone (PACERONE) 200 MG tablet Take 1 tablet (200 mg total) by mouth daily. (Patient not taking: Reported on 04/04/2020) 60 tablet 0 Not Taking at Unknown time  . ammonium lactate (LAC-HYDRIN) 12 % lotion Apply topically 2 (two) times  daily. (Patient not taking: Reported on 04/04/2020) 400 g 0 Not Taking at Unknown time  . ferrous sulfate 325 (65 FE) MG tablet Take 1 tablet (325 mg total) by mouth 2 (two) times daily with a meal. (Patient not taking: Reported on 04/04/2020) 30 tablet 3 Not Taking at Unknown time  . ipratropium-albuterol (DUONEB) 0.5-2.5 (3) MG/3ML SOLN J44.1  3ml nebulizer every six hours as needed for shortness of breath (Patient taking differently: Inhale 3 mLs into the lungs every 6 (six) hours as needed. ) 360 mL 0 PRN at PRN  . metoprolol (TOPROL-XL) 50 MG 24 hr tablet Take 1 tablet (50 mg total) by mouth 2 (two) times daily. (Patient not taking: Reported on 04/04/2020)   Not Taking at Unknown time  . mometasone-formoterol (DULERA) 100-5 MCG/ACT AERO Inhale 2 puffs into the lungs 2 (two) times daily. (Patient not taking: Reported on 04/04/2020) 13 g 0 Not Taking at Unknown time  . terbinafine (LAMISIL) 1 % cream Apply twice a day to  feet (okay to substitute any anti fungal cream that is the cheapest) (Patient not taking: Reported on 04/04/2020) 30 g 0 Not Taking at Unknown time  . tiotropium (SPIRIVA HANDIHALER) 18 MCG inhalation capsule Place 1 capsule (18 mcg total) into inhaler and inhale daily. (Patient not taking: Reported on 11/25/2019) 30 capsule 0 Not Taking at Unknown time    Current medications: Current Facility-Administered Medications  Medication Dose Route Frequency Provider Last Rate Last Admin  . 0.9 %  sodium chloride infusion  250 mL Intravenous PRN Niu, Xilin, MD      . acetaminophen (TYLENOL) tablet 650 mg  650 mg Oral Q4H PRN Niu, Xilin, MD      . albuterol (PROVENTIL) (2.5 MG/3ML) 0.083% nebulizer solution 2.5 mg  2.5 mg Nebulization Q4H PRN Niu, Xilin, MD      . amLODipine (NORVASC) tablet 5 mg  5 mg Oral Daily Niu, Xilin, MD   5 mg at 04/07/20 0956  . cefTRIAXone (ROCEPHIN) 1 g in sodium chloride 0.9 % 100 mL IVPB  1 g Intravenous Q24H Niu, Xilin, MD   Stopped at 04/07/20 1035  . clopidogrel (PLAVIX) tablet 75 mg  75 mg Oral Daily Niu, Xilin, MD   75 mg at 04/07/20 0956  . dextromethorphan-guaiFENesin (MUCINEX DM) 30-600 MG per 12 hr tablet 1 tablet  1 tablet Oral BID PRN Niu, Xilin, MD   1 tablet at 04/06/20 1837  . enoxaparin (LOVENOX) injection 30 mg  30 mg Subcutaneous Q24H Barefoot, Jody C, RPH   30 mg at 04/06/20 2202  . ferrous sulfate tablet 325 mg  325 mg Oral BID WC Niu, Xilin, MD   325 mg at 04/07/20 0955  . gabapentin (NEURONTIN) capsule 100 mg  100 mg Oral BID Niu, Xilin, MD   100 mg at 04/07/20 0955  . hydrALAZINE (APRESOLINE) injection 5 mg  5 mg Intravenous Q2H PRN Niu, Xilin, MD      . hydrALAZINE (APRESOLINE) tablet 100 mg  100 mg Oral BID Niu, Xilin, MD   100 mg at 04/07/20 0955  . insulin aspart (novoLOG) injection 0-15 Units  0-15 Units Subcutaneous TID WC Amin, Sumayya, MD   5 Units at 04/07/20 1154  . insulin aspart (novoLOG) injection 0-5 Units  0-5 Units Subcutaneous QHS Niu,  Xilin, MD   4 Units at 04/06/20 2202  . insulin glargine (LANTUS) injection 10 Units  10 Units Subcutaneous BID Amin, Sumayya, MD   10 Units at 04/07/20 0956  .   ipratropium-albuterol (DUONEB) 0.5-2.5 (3) MG/3ML nebulizer solution 3 mL  3 mL Nebulization Q4H Ivor Costa, MD   3 mL at 04/07/20 1124  . isosorbide mononitrate (IMDUR) 24 hr tablet 30 mg  30 mg Oral Daily Ivor Costa, MD   30 mg at 04/07/20 0955  . metoprolol succinate (TOPROL-XL) 24 hr tablet 200 mg  200 mg Oral Daily Ivor Costa, MD   200 mg at 04/07/20 1020  . ondansetron (ZOFRAN) injection 4 mg  4 mg Intravenous Q6H PRN Ivor Costa, MD      . pantoprazole (PROTONIX) EC tablet 40 mg  40 mg Oral Daily Ivor Costa, MD   40 mg at 04/07/20 0956  . polyethylene glycol (MIRALAX / GLYCOLAX) packet 17 g  17 g Oral Daily Lorella Nimrod, MD      . rosuvastatin (CRESTOR) tablet 40 mg  40 mg Oral Daily Ivor Costa, MD   40 mg at 04/07/20 0955  . sodium chloride flush (NS) 0.9 % injection 3 mL  3 mL Intravenous Q12H Ivor Costa, MD   3 mL at 04/06/20 2248  . sodium chloride flush (NS) 0.9 % injection 3 mL  3 mL Intravenous PRN Ivor Costa, MD       Facility-Administered Medications Ordered in Other Encounters  Medication Dose Route Frequency Provider Last Rate Last Admin  . sodium chloride flush (NS) 0.9 % injection 3 mL  3 mL Intravenous Q12H Jake Bathe, FNP          Allergies: No Known Allergies    Past Medical History: Past Medical History:  Diagnosis Date  . Anemia   . Aortic regurgitation   . B-cell lymphoma ([place]) 2009   DX AT [facility]  . Bronchitis   . CAD (coronary artery disease)   . Carotid stenosis   . CHF (congestive heart failure) ([place])   . Diabetes mellitus without complication ([place])   . Diabetes mellitus, type 2 ([place])   . Emphysema of lung ([place])   . Essential hypertension   . History of chemotherapy   . Hyperlipidemia   . Hypertension   . IDA (iron deficiency anemia)   . Leg edema   . Meralgia paraesthetica   .  Mitral regurgitation   . PUD (peptic ulcer disease)   . PVD (peripheral vascular disease) ([place])   . Tobacco abuse      Past Surgical History: Past Surgical History:  Procedure Laterality Date  . CARDIAC CATHETERIZATION  08/15/2007  . CARDIAC CATHETERIZATION Right 07/13/2016   Procedure: Right/Left Heart Cath and Coronary Angiography;  Surgeon: Dionisio David, MD;  Location: [facility];  Service: Cardiovascular;  Laterality: Right;  . COLONOSCOPY  03/2013  . ESOPHAGOGASTRODUODENOSCOPY  03/2013     Family History: Family History  Problem Relation Age of Onset  . CAD Mother   . Rheum arthritis Neg Hx   . Osteoarthritis Neg Hx   . Asthma Neg Hx   . Diabetes Neg Hx   . Cancer Neg Hx      Social History: Social History   Socioeconomic History  . Marital status: Single    Spouse name: Not on file  . Number of children: Not on file  . Years of education: Not on file  . Highest education level: Not on file  Occupational History  . Occupation: retired  Tobacco Use  . Smoking status: Former Smoker    Types: Cigarettes    Quit date: 08/30/2015    Years since quitting: 4.6  . Smokeless  tobacco: Never Used  Substance and Sexual Activity  . Alcohol use: No  . Drug use: No  . Sexual activity: Not on file  Other Topics Concern  . Not on file  Social History Narrative   Lives in a duplex apartment by himself.  Has a walker to ambulate   -His friends and neighbors check on him       Social Determinants of Health   Financial Resource Strain:   . Difficulty of Paying Living Expenses:   Food Insecurity:   . Worried About Charity fundraiser in the Last Year:   . Arboriculturist in the Last Year:   Transportation Needs:   . Film/video editor (Medical):   Marland Kitchen Lack of Transportation (Non-Medical):   Physical Activity:   . Days of Exercise per Week:   . Minutes of Exercise per Session:   Stress:   . Feeling of Stress :   Social Connections:   . Frequency of  Communication with Friends and Family:   . Frequency of Social Gatherings with Friends and Family:   . Attends Religious Services:   . Active Member of Clubs or Organizations:   . Attends Archivist Meetings:   Marland Kitchen Marital Status:   Intimate Partner Violence:   . Fear of Current or Ex-Partner:   . Emotionally Abused:   Marland Kitchen Physically Abused:   . Sexually Abused:      Review of Systems: Given hard of hearing status patient had difficult time interpreting review of systems questions and therefore unable to answer many questions.  Vital Signs: Blood pressure (!) 118/59, pulse 83, temperature 98.2 F (36.8 C), temperature source Oral, resp. rate 11, height 5' 9" (1.753 m), weight 86.7 kg, SpO2 95 %.  Weight trends: Filed Weights   04/05/20 0340 04/06/20 0432 04/07/20 0422  Weight: 83.5 kg 85.9 kg 86.7 kg    Physical Exam: General: NAD, resting in bed  Head: Normocephalic, atraumatic.  Eyes: Anicteric, EOMI  Nose: Mucous membranes moist, not inflammed, nonerythematous.  Throat: Oropharynx nonerythematous, no exudate appreciated.   Neck: Supple, trachea midline.  Lungs:  Normal respiratory effort. Clear to auscultation BL without crackles or wheezes.  Heart: S1S2 no rubs  Abdomen:  BS normoactive. Soft, Nondistended, non-tender.  No masses or organomegaly.  Extremities: No pretibial edema.  Neurologic: Awake, alert, extremely hard of hearing  Skin: No visible rashes, scars.    Lab results: Basic Metabolic Panel: Recent Labs  Lab 04/05/20 0433 04/06/20 0447 04/07/20 0438  NA 140 138 142  K 4.0 4.2 4.3  CL 100 97* 103  CO2 _0 GLUCOSE 251* 309* 230*  BUN 41* 71* 85*  CREATININE 2.57* 3.29* 3.33*  CALCIUM 7.5* 7.6* 7.1*    Liver Function Tests: Recent Labs  Lab 04/04/20 0446  AST 15  ALT 17  ALKPHOS 91  BILITOT 0.8  PROT 8.0  ALBUMIN 3.5   No results for input(s): LIPASE, AMYLASE in the last 168 hours. No results for input(s): AMMONIA in the  last 168 hours.  CBC: Recent Labs  Lab 04/04/20 0446 04/06/20 0447 04/07/20 0438  WBC 15.3* 12.4* 11.8*  NEUTROABS 13.4*  --   --   HGB 9.5* 8.0* 7.2*  HCT 30.6* 26.0* 23.3*  MCV 88.7 88.7 88.3  PLT 146* 158 167    Cardiac Enzymes: No results for input(s): CKTOTAL, CKMB, CKMBINDEX, TROPONINI in the last 168 hours.  BNP: Invalid input(s): POCBNP  CBG: Recent Labs  Lab 04/06/20 1126 04/06/20 1621 04/06/20 2101 04/07/20 0759 04/07/20 1136  GLUCAP 281* 313* 319* 196* 216*    Microbiology: Results for orders placed or performed during the hospital encounter of 04/04/20  Blood culture (routine x 2)     Status: None (Preliminary result)   Collection Time: 04/04/20  4:47 AM   Specimen: Right Antecubital; Blood  Result Value Ref Range Status   Specimen Description RIGHT ANTECUBITAL  Final   Special Requests   Final    BOTTLES DRAWN AEROBIC AND ANAEROBIC Blood Culture adequate volume   Culture   Final    NO GROWTH 3 DAYS Performed at [facility], [address] [place], [place] [address]    Report Status PENDING  Incomplete  Respiratory Panel by RT PCR (Flu A&B, Covid) - Nasopharyngeal Swab     Status: None   Collection Time: 04/04/20  4:48 AM   Specimen: Nasopharyngeal Swab  Result Value Ref Range Status   SARS Coronavirus 2 by RT PCR NEGATIVE NEGATIVE Final    Comment: (NOTE) SARS-CoV-2 target nucleic acids are NOT DETECTED. The SARS-CoV-2 RNA is generally detectable in upper respiratoy specimens during the acute phase of infection. The lowest concentration of SARS-CoV-2 viral copies this assay can detect is 131 copies/mL. A negative result does not preclude SARS-Cov-2 infection and should not be used as the sole basis for treatment or other patient management decisions. A negative result may occur with  improper specimen collection/handling, submission of specimen other than nasopharyngeal swab, presence of viral mutation(s) within the areas targeted  by this assay, and inadequate number of viral copies (<131 copies/mL). A negative result must be combined with clinical observations, patient history, and epidemiological information. The expected result is Negative. Fact Sheet for Patients:  https://www.fda.gov/media/142436/download Fact Sheet for Healthcare Providers:  https://www.fda.gov/media/142435/download This test is not yet ap proved or cleared by the [place] FDA and  has been authorized for detection and/or diagnosis of SARS-CoV-2 by FDA under an Emergency Use Authorization (EUA). This EUA will remain  in effect (meaning this test can be used) for the duration of the COVID-19 declaration under Section 564(b)(1) of the Act, 21 U.S.C. section 360bbb-3(b)(1), unless the authorization is terminated or revoked sooner.    Influenza A by PCR NEGATIVE NEGATIVE Final   Influenza B by PCR NEGATIVE NEGATIVE Final    Comment: (NOTE) The Xpert Xpress SARS-CoV-2/FLU/RSV assay is intended as an aid in  the diagnosis of influenza from Nasopharyngeal swab specimens and  should not be used as a sole basis for treatment. Nasal washings and  aspirates are unacceptable for Xpert Xpress SARS-CoV-2/FLU/RSV  testing. Fact Sheet for Patients: https://www.fda.gov/media/142436/download Fact Sheet for Healthcare Providers: https://www.fda.gov/media/142435/download This test is not yet approved or cleared by the [place] FDA and  has been authorized for detection and/or diagnosis of SARS-CoV-2 by  FDA under an Emergency Use Authorization (EUA). This EUA will remain  in effect (meaning this test can be used) for the duration of the  Covid-19 declaration under Section 564(b)(1) of the Act, 21  U.S.C. section 360bbb-3(b)(1), unless the authorization is  terminated or revoked. Performed at [facility], [address] [place], [place] [address]   Blood culture (routine x 2)     Status: None (Preliminary result)   Collection  Time: 04/04/20  4:52 AM   Specimen: BLOOD LEFT WRIST  Result Value Ref Range Status   Specimen Description BLOOD LEFT WRIST  Final   Special Requests   Final      BOTTLES DRAWN AEROBIC AND ANAEROBIC Blood Culture adequate volume   Culture   Final    NO GROWTH 3 DAYS Performed at [facility], [address] [place], [place] [address]    Report Status PENDING  Incomplete  Urine culture     Status: None   Collection Time: 04/04/20  6:28 AM   Specimen: Urine, Random  Result Value Ref Range Status   Specimen Description   Final    URINE, RANDOM Performed at [facility], [address], [place], [place] [address]    Special Requests   Final    NONE Performed at [facility], [address] [place], [place] [address]    Culture   Final    NO GROWTH Performed at [facility], [place] [address], [place], [place] [address]    Report Status 04/05/2020 FINAL  Final    Coagulation Studies: No results for input(s): LABPROT, INR in the last 72 hours.  Urinalysis: No results for input(s): COLORURINE, LABSPEC, PHURINE, GLUCOSEU, HGBUR, BILIRUBINUR, KETONESUR, PROTEINUR, UROBILINOGEN, NITRITE, LEUKOCYTESUR in the last 72 hours.  Invalid input(s): APPERANCEUR    Imaging: US RENAL  Result Date: 04/07/2020 CLINICAL DATA:  AKI EXAM: RENAL / URINARY TRACT ULTRASOUND COMPLETE COMPARISON:  None. FINDINGS: Right Kidney: Renal measurements: 10.2 x 5 x 4.5 cm = volume: 120.4 mL . Echogenicity within normal limits. Two small cysts. No solid mass or hydronephrosis visualized. Left Kidney: Renal measurements: 11.1 x 5.8 x 4.7 cm = volume: 155.8 mL. Echogenicity within normal limits. No mass or hydronephrosis visualized. Bladder: Appears normal for degree of bladder distention. Other: None. IMPRESSION: No hydronephrosis. Electronically Signed   By: Macy Mis M.D.   On: 04/07/2020 09:31      Assessment & Plan: Pt is a 85 y.o. male with a PMHx of B-cell lymphoma,  aortic regurgitation, coronary artery disease, carotid artery stenosis, congestive heart failure, diabetes mellitus type 2, COPD, hypertension, hyperlipidemia, iron deficiency anemia, peptic ulcer disease, peripheral vascular disease, who was admitted to [facility] on 04/04/2020 for evaluation of shortness of breath.  1.  Acute kidney injury/chronic kidney disease stage IIIb baseline creatinine 1.79 with EGFR of 35.  Suspect that acute kidney injury may be due to volume depletion from earlier use of diuretics.  Start the patient on gentle IV fluid hydration with 0.9 normal saline.  We will administer this at 40 cc/h.  No indication for dialysis at the moment.  Avoid nephrotoxins as possible.  Also check SPEP, UPEP, ANA/ANCA antibodies.  2.  Anemia of chronic kidney disease.  Hemoglobin worsening.  Currently down to 7.2.  Consider transfusion for hemoglobin of 7 or less.  3.  Further plan as patient progresses.

## 2020-04-08 ENCOUNTER — Encounter: Payer: Self-pay | Admitting: Internal Medicine

## 2020-04-08 DIAGNOSIS — N179 Acute kidney failure, unspecified: Secondary | ICD-10-CM

## 2020-04-08 DIAGNOSIS — Z7189 Other specified counseling: Secondary | ICD-10-CM

## 2020-04-08 DIAGNOSIS — Z515 Encounter for palliative care: Secondary | ICD-10-CM

## 2020-04-08 LAB — CBC
HCT: 24 % — ABNORMAL LOW (ref 39.0–52.0)
Hemoglobin: 7.8 g/dL — ABNORMAL LOW (ref 13.0–17.0)
MCH: 28 pg (ref 26.0–34.0)
MCHC: 32.5 g/dL (ref 30.0–36.0)
MCV: 86 fL (ref 80.0–100.0)
Platelets: 160 10*3/uL (ref 150–400)
RBC: 2.79 MIL/uL — ABNORMAL LOW (ref 4.22–5.81)
RDW: 15.3 % (ref 11.5–15.5)
WBC: 8.3 10*3/uL (ref 4.0–10.5)
nRBC: 0 % (ref 0.0–0.2)

## 2020-04-08 LAB — PROTEIN ELECTROPHORESIS, SERUM
A/G Ratio: 0.9 (ref 0.7–1.7)
Albumin ELP: 2.7 g/dL — ABNORMAL LOW (ref 2.9–4.4)
Alpha-1-Globulin: 0.3 g/dL (ref 0.0–0.4)
Alpha-2-Globulin: 0.9 g/dL (ref 0.4–1.0)
Beta Globulin: 0.8 g/dL (ref 0.7–1.3)
Gamma Globulin: 1.1 g/dL (ref 0.4–1.8)
Globulin, Total: 3.1 g/dL (ref 2.2–3.9)
Total Protein ELP: 5.8 g/dL — ABNORMAL LOW (ref 6.0–8.5)

## 2020-04-08 LAB — BASIC METABOLIC PANEL
Anion gap: 9 (ref 5–15)
BUN: 84 mg/dL — ABNORMAL HIGH (ref 8–23)
CO2: 27 mmol/L (ref 22–32)
Calcium: 7 mg/dL — ABNORMAL LOW (ref 8.9–10.3)
Chloride: 104 mmol/L (ref 98–111)
Creatinine, Ser: 3.12 mg/dL — ABNORMAL HIGH (ref 0.61–1.24)
GFR calc Af Amer: 20 mL/min — ABNORMAL LOW (ref >60)
GFR calc non Af Amer: 18 mL/min — ABNORMAL LOW (ref >60)
Glucose, Bld: 283 mg/dL — ABNORMAL HIGH (ref 70–99)
Potassium: 4.4 mmol/L (ref 3.5–5.1)
Sodium: 140 mmol/L (ref 135–145)

## 2020-04-08 LAB — PROTEIN ELECTRO, RANDOM URINE
Albumin ELP, Urine: 36.4 %
Alpha-1-Globulin, U: 9.5 %
Alpha-2-Globulin, U: 15.8 %
Beta Globulin, U: 21.7 %
Gamma Globulin, U: 16.6 %
M Component, Ur: 4.4 % — ABNORMAL HIGH
Total Protein, Urine: 57.6 mg/dL

## 2020-04-08 LAB — GLUCOSE, CAPILLARY
Glucose-Capillary: 233 mg/dL — ABNORMAL HIGH (ref 70–99)
Glucose-Capillary: 184 mg/dL — ABNORMAL HIGH (ref 70–99)
Glucose-Capillary: 153 mg/dL — ABNORMAL HIGH (ref 70–99)
Glucose-Capillary: 130 mg/dL — ABNORMAL HIGH (ref 70–99)
Glucose-Capillary: 159 mg/dL — ABNORMAL HIGH (ref 70–99)

## 2020-04-08 LAB — GLOMERULAR BASEMENT MEMBRANE ANTIBODIES: GBM Ab: 4 units (ref 0–20)

## 2020-04-08 LAB — MPO/PR-3 (ANCA) ANTIBODIES
ANCA Proteinase 3: 6.2 U/mL — ABNORMAL HIGH (ref 0.0–3.5)
Myeloperoxidase Abs: <9 U/mL (ref 0.0–9.0)

## 2020-04-08 LAB — C4 COMPLEMENT: Complement C4, Body Fluid: 8 mg/dL — ABNORMAL LOW (ref 12–38)

## 2020-04-08 LAB — ANA W/REFLEX IF POSITIVE: Anti Nuclear Antibody (ANA): NEGATIVE

## 2020-04-08 LAB — C3 COMPLEMENT: C3 Complement: 105 mg/dL (ref 82–167)

## 2020-04-08 MED ORDER — IPRATROPIUM-ALBUTEROL 0.5-2.5 (3) MG/3ML IN SOLN
3.0000 mL | Freq: Three times a day (TID) | RESPIRATORY_TRACT | Status: DC
  Administered 2020-04-08 – 2020-04-15 (×20): 3 mL via RESPIRATORY_TRACT
  Filled 2020-04-08 (×21): qty 3

## 2020-04-08 MED ORDER — IPRATROPIUM-ALBUTEROL 0.5-2.5 (3) MG/3ML IN SOLN
3.0000 mL | Freq: Four times a day (QID) | RESPIRATORY_TRACT | Status: DC
  Administered 2020-04-08: 3 mL via RESPIRATORY_TRACT
  Filled 2020-04-08: qty 3

## 2020-04-08 MED ORDER — INSULIN GLARGINE 100 UNIT/ML ~~LOC~~ SOLN
12.0000 [IU] | Freq: Two times a day (BID) | SUBCUTANEOUS | Status: DC
  Administered 2020-04-08 – 2020-04-09 (×3): 12 [IU] via SUBCUTANEOUS
  Filled 2020-04-08 (×6): qty 0.12

## 2020-04-08 NOTE — Progress Notes (Signed)
Verbal order to discontinue telemetry from Dr. Reesa Chew.

## 2020-04-08 NOTE — Progress Notes (Signed)
[facility]  ROUNDING NOTE   Subjective:  Patient seen and evaluated at bedside. Still quite hard of hearing. Creatinine down to 3.1. Renal ultrasound negative for hydronephrosis. PR-3 antibody shows low-grade positivity.  Objective:  Vital signs in last 24 hours:  Temp:  [98 F (36.7 C)-98.7 F (37.1 C)] 98.3 F (36.8 C) (05/11 1147) Pulse Rate:  [74-82] 82 (05/11 1147) Resp:  [15-23] 18 (05/11 1147) BP: (111-131)/(54-59) 113/54 (05/11 1147) SpO2:  [93 %-100 %] 93 % (05/11 1147) FiO2 (%):  [40 %] 40 % (05/11 0311) Weight:  [87.5 kg] 87.5 kg (05/11 0608)  Weight change: 0.862 kg Filed Weights   04/06/20 0432 04/07/20 0422 04/08/20 6967  Weight: 85.9 kg 86.7 kg 87.5 kg    Intake/Output: I/O last 3 completed shifts: In: 1360.2 [P.O.:360; I.V.:900.2; IV Piggyback:100] Out: 2225 [Urine:2225]   Intake/Output this shift:  Total I/O In: 720 [P.O.:720] Out: 850 [Urine:850]  Physical Exam: General: No acute distress  Head: Normocephalic, atraumatic. Moist oral mucosal membranes  Eyes: Anicteric  Neck: Supple, trachea midline  Lungs:  Clear to auscultation, normal effort  Heart: S1S2 no rubs  Abdomen:  Soft, nontender, bowel sounds present  Extremities: Trace peripheral edema.  Neurologic: Awake, alert, extremely hard of hearing  Skin: No lesions       Basic Metabolic Panel: Recent Labs  Lab 04/04/20 0446 04/04/20 0446 04/05/20 0433 04/05/20 0433 04/06/20 0447 04/07/20 0438 04/08/20 0448  NA 145  --  140  --  138 142 140  K 3.9  --  4.0  --  4.2 4.3 4.4  CL 104  --  100  --  97* 103 104  CO2 30  --  30  --  29 28 27   GLUCOSE 207*  --  251*  --  309* 230* 283*  BUN 27*  --  41*  --  71* 85* 84*  CREATININE 1.80*  --  2.57*  --  3.29* 3.33* 3.12*  CALCIUM 8.0*   < > 7.5*   < > 7.6* 7.1* 7.0*   < > = values in this interval not displayed.    Liver Function Tests: Recent Labs  Lab 04/04/20 0446  AST 15  ALT 17  ALKPHOS 91  BILITOT 0.8   PROT 8.0  ALBUMIN 3.5   No results for input(s): LIPASE, AMYLASE in the last 168 hours. No results for input(s): AMMONIA in the last 168 hours.  CBC: Recent Labs  Lab 04/04/20 0446 04/06/20 0447 04/07/20 0438 04/08/20 0448  WBC 15.3* 12.4* 11.8* 8.3  NEUTROABS 13.4*  --   --   --   HGB 9.5* 8.0* 7.2* 7.8*  HCT 30.6* 26.0* 23.3* 24.0*  MCV 88.7 88.7 88.3 86.0  PLT 146* 158 167 160    Cardiac Enzymes: No results for input(s): CKTOTAL, CKMB, CKMBINDEX, TROPONINI in the last 168 hours.  BNP: Invalid input(s): POCBNP  CBG: Recent Labs  Lab 04/07/20 1136 04/07/20 1631 04/07/20 2114 04/08/20 0750 04/08/20 1148  GLUCAP 216* 272* 343* 233* 184*    Microbiology: Results for orders placed or performed during the hospital encounter of 04/04/20  Blood culture (routine x 2)     Status: None (Preliminary result)   Collection Time: 04/04/20  4:47 AM   Specimen: Right Antecubital; Blood  Result Value Ref Range Status   Specimen Description RIGHT ANTECUBITAL  Final   Special Requests   Final    BOTTLES DRAWN AEROBIC AND ANAEROBIC Blood Culture adequate volume   Culture  Final    NO GROWTH 4 DAYS Performed at [facility], [address] [place], [place] [address]    Report Status PENDING  Incomplete  Respiratory Panel by RT PCR (Flu A&B, Covid) - Nasopharyngeal Swab     Status: None   Collection Time: 04/04/20  4:48 AM   Specimen: Nasopharyngeal Swab  Result Value Ref Range Status   SARS Coronavirus 2 by RT PCR NEGATIVE NEGATIVE Final    Comment: (NOTE) SARS-CoV-2 target nucleic acids are NOT DETECTED. The SARS-CoV-2 RNA is generally detectable in upper respiratoy specimens during the acute phase of infection. The lowest concentration of SARS-CoV-2 viral copies this assay can detect is 131 copies/mL. A negative result does not preclude SARS-Cov-2 infection and should not be used as the sole basis for treatment or other patient management decisions. A  negative result may occur with  improper specimen collection/handling, submission of specimen other than nasopharyngeal swab, presence of viral mutation(s) within the areas targeted by this assay, and inadequate number of viral copies (<131 copies/mL). A negative result must be combined with clinical observations, patient history, and epidemiological information. The expected result is Negative. Fact Sheet for Patients:  PinkCheek.be Fact Sheet for Healthcare Providers:  GravelBags.it This test is not yet ap proved or cleared by the [place] FDA and  has been authorized for detection and/or diagnosis of SARS-CoV-2 by FDA under an Emergency Use Authorization (EUA). This EUA will remain  in effect (meaning this test can be used) for the duration of the COVID-19 declaration under Section 564(b)(1) of the Act, 21 U.S.C. section 360bbb-3(b)(1), unless the authorization is terminated or revoked sooner.    Influenza A by PCR NEGATIVE NEGATIVE Final   Influenza B by PCR NEGATIVE NEGATIVE Final    Comment: (NOTE) The Xpert Xpress SARS-CoV-2/FLU/RSV assay is intended as an aid in  the diagnosis of influenza from Nasopharyngeal swab specimens and  should not be used as a sole basis for treatment. Nasal washings and  aspirates are unacceptable for Xpert Xpress SARS-CoV-2/FLU/RSV  testing. Fact Sheet for Patients: PinkCheek.be Fact Sheet for Healthcare Providers: GravelBags.it This test is not yet approved or cleared by the [place] FDA and  has been authorized for detection and/or diagnosis of SARS-CoV-2 by  FDA under an Emergency Use Authorization (EUA). This EUA will remain  in effect (meaning this test can be used) for the duration of the  Covid-19 declaration under Section 564(b)(1) of the Act, 21  U.S.C. section 360bbb-3(b)(1), unless the authorization is   terminated or revoked. Performed at [facility], [address] [place], [place] [address]   Blood culture (routine x 2)     Status: None (Preliminary result)   Collection Time: 04/04/20  4:52 AM   Specimen: BLOOD LEFT WRIST  Result Value Ref Range Status   Specimen Description BLOOD LEFT WRIST  Final   Special Requests   Final    BOTTLES DRAWN AEROBIC AND ANAEROBIC Blood Culture adequate volume   Culture   Final    NO GROWTH 4 DAYS Performed at [facility], [address], [place], [place] [address]    Report Status PENDING  Incomplete  Urine culture     Status: None   Collection Time: 04/04/20  6:28 AM   Specimen: Urine, Random  Result Value Ref Range Status   Specimen Description   Final    URINE, RANDOM Performed at [facility], [address], [place], [place] [address]    Special  Requests   Final    NONE Performed at [facility], [address], [place], [place] [address]    Culture   Final    NO GROWTH Performed at [facility], [place] [address] [place], [place] [address]    Report Status 04/05/2020 FINAL  Final    Coagulation Studies: No results for input(s): LABPROT, INR in the last 72 hours.  Urinalysis: No results for input(s): COLORURINE, LABSPEC, PHURINE, GLUCOSEU, HGBUR, BILIRUBINUR, KETONESUR, PROTEINUR, UROBILINOGEN, NITRITE, LEUKOCYTESUR in the last 72 hours.  Invalid input(s): APPERANCEUR    Imaging: US RENAL  Result Date: 04/07/2020 CLINICAL DATA:  AKI EXAM: RENAL / URINARY TRACT ULTRASOUND COMPLETE COMPARISON:  None. FINDINGS: Right Kidney: Renal measurements: 10.2 x 5 x 4.5 cm = volume: 120.4 mL . Echogenicity within normal limits. Two small cysts. No solid mass or hydronephrosis visualized. Left Kidney: Renal measurements: 11.1 x 5.8 x 4.7 cm = volume: 155.8 mL. Echogenicity within normal limits. No mass or hydronephrosis visualized. Bladder: Appears normal for degree of bladder distention. Other:  None. IMPRESSION: No hydronephrosis. Electronically Signed   By: Macy Mis M.D.   On: 04/07/2020 09:31     Medications:   . sodium chloride    . sodium chloride 40 mL/hr at 04/08/20 0608  . cefTRIAXone (ROCEPHIN)  IV 1 g (04/08/20 0806)   . amLODipine  5 mg Oral Daily  . clopidogrel  75 mg Oral Daily  . enoxaparin (LOVENOX) injection  30 mg Subcutaneous Q24H  . ferrous sulfate  325 mg Oral BID WC  . gabapentin  100 mg Oral BID  . hydrALAZINE  100 mg Oral BID  . insulin aspart  0-15 Units Subcutaneous TID WC  . insulin aspart  0-5 Units Subcutaneous QHS  . insulin glargine  10 Units Subcutaneous BID  . ipratropium-albuterol  3 mL Nebulization Q6H  . isosorbide mononitrate  30 mg Oral Daily  . metoprolol  200 mg Oral Daily  . pantoprazole  40 mg Oral Daily  . polyethylene glycol  17 g Oral Daily  . rosuvastatin  40 mg Oral Daily  . sodium chloride flush  3 mL Intravenous Q12H   sodium chloride, acetaminophen, albuterol, dextromethorphan-guaiFENesin, hydrALAZINE, ondansetron (ZOFRAN) IV, sodium chloride flush  Assessment/ Plan:  85 y.o. male with a PMHx of B-cell lymphoma, aortic regurgitation, coronary artery disease, carotid artery stenosis, congestive heart failure, diabetes mellitus type 2, COPD, hypertension, hyperlipidemia, iron deficiency anemia, peptic ulcer disease, peripheral vascular disease, who was admitted to [facility] on 04/04/2020 for evaluation of shortness of breath.  1.  Acute kidney injury/chronic kidney disease stage IIIb baseline creatinine 1.79 with EGFR of 35/proteinuria.  Suspect that acute kidney injury may be due to volume depletion from earlier use of diuretics.    Renal ultrasound negative hydronephrosis. -Renal function not significantly improved thus far.  Creatinine currently 3.1.  In addition he had low-grade positive ANCA antibody.  Patient also has underlying proteinuria with most recent urine protein creatinine ratio 1.2.  Significance of this unclear  however highly doubt ANCA related vasculitis as his creatinine was close to baseline at 1.  8 on 04/04/2020.  No indication for dialysis at the moment.  Continue to monitor renal parameters closely over the next several days.  2.  Anemia of chronic kidney disease. Lab Results  Component Value Date   HGB 7.8 (L) 04/08/2020  Hemoglobin 7.8.  Continue to monitor closely.      LOS: 4 Dazja Houchin 5/11/20211:37 PM

## 2020-04-08 NOTE — Consult Note (Signed)
Consultation Note Date: 04/08/2020   Patient Name: Brett [name]  DOB: [birth year]  MRN: [id number]  Age / Sex: 85 y.o., male  PCP: Perrin Maltese, MD Referring Physician: Lorella Nimrod, MD  Reason for Consultation: Establishing goals of care and Psychosocial/spiritual support  HPI/Patient Profile: 85 y.o. male  with past medical history of B-cell lymphoma with chemotherapy at [facility], heart failure, CAD, diabetes, HTN/HLD, mitral valve regurgitation, aortic regurgitation, PVD, tobacco use, iron deficiency anemia, admitted on 04/04/2020 with acute kidney injury on chronic kidney disease.   Clinical Assessment and Goals of Care: Suspecting cardiorenal syndrome, with creatinine levels currently at 3.12 from 3.33-3.29-2.57-1.8 at base line.   Mr. Sortor is resting quietly in bed.  He greets me, but doesn't make eye contact.  He tells me that he can see, but not well.  He is alert and oriented, able to make his needs known.  There is no family support at bedside at this time.   Mr. Nouri is very hard of hearing.  I asked how he is doing, and he tells me that he is feeling better.  He tells me that he has been hospitalized 5 days, and is ready to go home.  I asked about rehab, but he tells me his preference is returning home.  It is clear that he does not wish to have detailed conversations today.  Meaningful goals of care conversation unable to be conducted today.  I share that I will call his friend/roommate/HC POA, Claudette Head.  He agrees.  Call to [employer].  No answer, unable to leave voicemail. Second call to [employer] with no answer and unable to leave VM.   Conference with attending, bedside nursing staff, transition care team related to patient condition, needs, goals of care.  PMT to continue to follow.   HCPOA    HCPOA - friend and roommate, Claudette Head.     SUMMARY OF  RECOMMENDATIONS   Continue to treat the treatable No CPR or intubation Requesting home instead of rehab.    Code Status/Advance Care Planning:  DNR  Symptom Management:   Per hospitalist, no additional needs at this time.  Palliative Prophylaxis:   Frequent Pain Assessment and Oral Care  Additional Recommendations (Limitations, Scope, Preferences):  treat the treatable, but no CPR or intubation   Psycho-social/Spiritual:   Desire for further Chaplaincy support:no  Additional Recommendations: Caregiving  Support/Resources and Education on Hospice  Prognosis:   Unable to determine, based on outcomes.   Discharge Planning: To be determined, based on outcomes.       Primary Diagnoses: Present on Admission: . Acute on chronic respiratory failure with hypoxemia ([place]) . Atrial fibrillation, chronic ([place]) . CAD (coronary artery disease) . COPD exacerbation ([place]) . HTN (hypertension) . Stroke ([place]) . CKD (chronic kidney disease), stage IIIb . Type II diabetes mellitus with renal manifestations ([place]) . HLD (hyperlipidemia) . CAP (community acquired pneumonia) . Acute on chronic diastolic CHF (congestive heart failure) ([place]) . Iron deficiency anemia   I have reviewed  the medical record, interviewed the patient and family, and examined the patient. The following aspects are pertinent.  Past Medical History:  Diagnosis Date  . Anemia   . Aortic regurgitation   . B-cell lymphoma ([place]) 2009   DX AT [facility]  . Bronchitis   . CAD (coronary artery disease)   . Carotid stenosis   . CHF (congestive heart failure) ([place])   . Diabetes mellitus without complication ([place])   . Diabetes mellitus, type 2 ([place])   . Emphysema of lung ([place])   . Essential hypertension   . History of chemotherapy   . Hyperlipidemia   . Hypertension   . IDA (iron deficiency anemia)   . Leg edema   . Meralgia paraesthetica   . Mitral regurgitation   . PUD (peptic ulcer disease)   . PVD  (peripheral vascular disease) ([place])   . Tobacco abuse    Social History   Socioeconomic History  . Marital status: Single    Spouse name: Not on file  . Number of children: Not on file  . Years of education: Not on file  . Highest education level: Not on file  Occupational History  . Occupation: retired  Tobacco Use  . Smoking status: Former Smoker    Types: Cigarettes    Quit date: 08/30/2015    Years since quitting: 4.6  . Smokeless tobacco: Never Used  Substance and Sexual Activity  . Alcohol use: No  . Drug use: No  . Sexual activity: Not on file  Other Topics Concern  . Not on file  Social History Narrative   Lives in a duplex apartment by himself.  Has a walker to ambulate   -His friends and neighbors check on him       Social Determinants of Health   Financial Resource Strain:   . Difficulty of Paying Living Expenses:   Food Insecurity:   . Worried About Charity fundraiser in the Last Year:   . Arboriculturist in the Last Year:   Transportation Needs:   . Film/video editor (Medical):   Marland Kitchen Lack of Transportation (Non-Medical):   Physical Activity:   . Days of Exercise per Week:   . Minutes of Exercise per Session:   Stress:   . Feeling of Stress :   Social Connections:   . Frequency of Communication with Friends and Family:   . Frequency of Social Gatherings with Friends and Family:   . Attends Religious Services:   . Active Member of Clubs or Organizations:   . Attends Archivist Meetings:   Marland Kitchen Marital Status:    Family History  Problem Relation Age of Onset  . CAD Mother   . Rheum arthritis Neg Hx   . Osteoarthritis Neg Hx   . Asthma Neg Hx   . Diabetes Neg Hx   . Cancer Neg Hx    Scheduled Meds: . amLODipine  5 mg Oral Daily  . clopidogrel  75 mg Oral Daily  . enoxaparin (LOVENOX) injection  30 mg Subcutaneous Q24H  . ferrous sulfate  325 mg Oral BID WC  . gabapentin  100 mg Oral BID  . hydrALAZINE  100 mg Oral BID  . insulin  aspart  0-15 Units Subcutaneous TID WC  . insulin aspart  0-5 Units Subcutaneous QHS  . insulin glargine  10 Units Subcutaneous BID  . ipratropium-albuterol  3 mL Nebulization Q6H  . isosorbide mononitrate  30 mg Oral Daily  . metoprolol  200 mg Oral Daily  . pantoprazole  40 mg Oral Daily  . polyethylene glycol  17 g Oral Daily  . rosuvastatin  40 mg Oral Daily  . sodium chloride flush  3 mL Intravenous Q12H   Continuous Infusions: . sodium chloride    . sodium chloride 40 mL/hr at 04/08/20 0608  . cefTRIAXone (ROCEPHIN)  IV 1 g (04/08/20 0806)   PRN Meds:.sodium chloride, acetaminophen, albuterol, dextromethorphan-guaiFENesin, hydrALAZINE, ondansetron (ZOFRAN) IV, sodium chloride flush Medications Prior to Admission:  Prior to Admission medications   Medication Sig Start Date End Date Taking? Authorizing Provider  amLODipine (NORVASC) 5 MG tablet Take 5 mg by mouth daily.  08/27/15  Yes [provider]  clopidogrel (PLAVIX) 75 MG tablet Take 75 mg by mouth daily.  08/27/15  Yes [provider]  furosemide (LASIX) 20 MG tablet Take 20 mg by mouth daily. 04/02/20  Yes [provider]  gabapentin (NEURONTIN) 100 MG capsule Take 1 capsule by mouth 2 (two) times daily.  09/22/15  Yes [provider]  glimepiride (AMARYL) 2 MG tablet Take 1 tablet (2 mg total) by mouth daily with breakfast. Patient taking differently: Take 4 mg by mouth 2 (two) times daily.  07/16/19 04/04/20 Yes Wieting, Richard, MD  hydrALAZINE (APRESOLINE) 100 MG tablet Take 100 mg by mouth 2 (two) times daily. 04/03/20  Yes [provider]  isosorbide mononitrate (IMDUR) 30 MG 24 hr tablet Take 30 mg by mouth daily.   Yes [provider]  metoprolol (TOPROL-XL) 200 MG 24 hr tablet Take 200 mg by mouth daily.   Yes [provider]  pantoprazole (PROTONIX) 40 MG tablet Take 40 mg by mouth daily.   Yes [provider]  rosuvastatin (CRESTOR) 40 MG tablet Take  40 mg by mouth daily. 04/13/19  Yes [provider]  Vitamin D, Ergocalciferol, (DRISDOL) 1.25 MG (50000 UT) CAPS capsule Take 50,000 Units by mouth once a week.   Yes [provider]  albuterol (PROVENTIL HFA;VENTOLIN HFA) 108 (90 Base) MCG/ACT inhaler Inhale 2 puffs into the lungs every 6 (six) hours as needed for wheezing or shortness of breath. 09/24/17   Loletha Grayer, MD  amiodarone (PACERONE) 200 MG tablet Take 1 tablet (200 mg total) by mouth daily. Patient not taking: Reported on 04/04/2020 07/16/19   Loletha Grayer, MD  ammonium lactate (LAC-HYDRIN) 12 % lotion Apply topically 2 (two) times daily. Patient not taking: Reported on 04/04/2020 07/16/19   Loletha Grayer, MD  ferrous sulfate 325 (65 FE) MG tablet Take 1 tablet (325 mg total) by mouth 2 (two) times daily with a meal. Patient not taking: Reported on 04/04/2020 05/25/19   Fritzi Mandes, MD  ipratropium-albuterol (DUONEB) 0.5-2.5 (3) MG/3ML SOLN J44.1  63ml nebulizer every six hours as needed for shortness of breath Patient taking differently: Inhale 3 mLs into the lungs every 6 (six) hours as needed.  07/16/19   Loletha Grayer, MD  metoprolol (TOPROL-XL) 50 MG 24 hr tablet Take 1 tablet (50 mg total) by mouth 2 (two) times daily. Patient not taking: Reported on 04/04/2020 12/05/19   Nolberto Hanlon, MD  mometasone-formoterol [facility]) 100-5 MCG/ACT AERO Inhale 2 puffs into the lungs 2 (two) times daily. Patient not taking: Reported on 04/04/2020 07/16/19   Loletha Grayer, MD  terbinafine (LAMISIL) 1 % cream Apply twice a day to feet (okay to substitute any anti fungal cream that is the cheapest) Patient not taking: Reported on 04/04/2020 07/16/19   Loletha Grayer, MD  tiotropium (  SPIRIVA HANDIHALER) 18 MCG inhalation capsule Place 1 capsule (18 mcg total) into inhaler and inhale daily. Patient not taking: Reported on 11/25/2019 05/31/18   Henreitta Leber, MD   No Known Allergies Review of Systems  Unable to perform ROS:  Other    Physical Exam Vitals and nursing note reviewed.  Constitutional:      General: He is not in acute distress.    Appearance: He is ill-appearing.  HENT:     Head: Atraumatic.  Cardiovascular:     Rate and Rhythm: Normal rate.  Pulmonary:     Effort: Pulmonary effort is normal. No respiratory distress.  Abdominal:     Palpations: Abdomen is soft.  Skin:    General: Skin is dry.  Neurological:     Mental Status: He is alert and oriented to person, place, and time.     Comments: HOH   Psychiatric:     Comments: Calm and cooperative, hearing loss      Vital Signs: BP (!) 113/54 (BP Location: Right Arm)   Pulse 82   Temp 98.3 F (36.8 C) (Oral)   Resp 18   Ht 5\' 9"  (1.753 m)   Wt 87.5 kg   SpO2 93%   BMI 28.50 kg/m  Pain Scale: 0-10   Pain Score: 0-No pain   SpO2: SpO2: 93 % O2 Device:SpO2: 93 % O2 Flow Rate: .O2 Flow Rate (L/min): 4 L/min  IO: Intake/output summary:   Intake/Output Summary (Last 24 hours) at 04/08/2020 1220 Last data filed at 04/08/2020 1150 Gross per 24 hour  Intake 877.25 ml  Output 2000 ml  Net -1122.75 ml    LBM: Last BM Date: 04/08/20 Baseline Weight: Weight: 85.8 kg Most recent weight: Weight: 87.5 kg     Palliative Assessment/Data:   Flowsheet Rows     Most Recent Value  Intake Tab  Referral Department  Hospitalist  Unit at Time of Referral  Med/Surg Unit  Palliative Care Primary Diagnosis  Cardiac  Date Notified  04/07/20  Palliative Care Type  New Palliative care  Reason for referral  Clarify Goals of Care  Date of Admission  04/04/20  Date first seen by Palliative Care  04/08/20  # of days Palliative referral response time  1 Day(s)  # of days IP prior to Palliative referral  3  Clinical Assessment  Palliative Performance Scale Score  30%  Pain Max last 24 hours  Not able to report  Pain Min Last 24 hours  Not able to report  Dyspnea Max Last 24 Hours  Not able to report  Dyspnea Min Last 24 hours  Not able to  report  Psychosocial & Spiritual Assessment  Palliative Care Outcomes      Time In: 0950 Time Out: 1020 Time Total: 30 minutes  Greater than 50%  of this time was spent counseling and coordinating care related to the above assessment and plan.  Signed by: Drue Novel, NP   Please contact Palliative Medicine Team phone at [phone] for questions and concerns.  For individual provider: See Shea Evans

## 2020-04-08 NOTE — Progress Notes (Signed)
PROGRESS NOTE    Brett [name]  EHU:[id number] DOB: Aug 09, 1935 DOA: 04/04/2020 PCP: Perrin Maltese, MD   Brief Narrative:  Brett [name] is a 85 y.o. male with medical history significant of hypertension, hyperlipidemia, diabetes mellitus, COPD on 2 L oxygen, stroke, GERD, PVD, PAD, iron deficiency anemia, dCHF, CAD, mitral valve regurgitation, aortic valve regurgitation, B-cell lymphoma (s/p of chemotherapy), atrial fibrillation not on anticoagulants, who presents with shortness breath.  He was found to be hypoxic at 72%, initially treated with BiPAP overnight, now saturating well on 4 L. He was also given IV Lasix for a possible CHF exacerbation, resulted in AKI.  Subjective: Patient has no new complaints .  Very hard of hearing, only hears from left ear.  Assessment & Plan:   Principal Problem:   Acute on chronic respiratory failure with hypoxemia (HCC) Active Problems:   HTN (hypertension)   CAD (coronary artery disease)   Stroke (HCC)   CAP (community acquired pneumonia)   COPD exacerbation (HCC)   Atrial fibrillation, chronic (HCC)   CHF, acute on chronic (HCC)   CKD (chronic kidney disease), stage IIIb   Type II diabetes mellitus with renal manifestations (HCC)   HLD (hyperlipidemia)   Acute on chronic diastolic CHF (congestive heart failure) (HCC)   Iron deficiency anemia   AKI (acute kidney injury) ([place])   Goals of care, counseling/discussion   Palliative care by specialist  Acute on chronic respiratory failure with hypoxemia (HCC)/COPD exacerbation and possible CAP.  Respiratory status seems improving, now saturating well on 4 L, still up from his baseline of 2 L.  There was no wheezing today.  Strep pneumococcal antigen was negative.  Blood cultures negative.  Legionella negative.PCT 0.31>>0.24 -Continue with bronchodilators. -Continue with ceftriaxone and azithromycin-should be completing the course today. -Completed the course of prednisone.  Acute on  chronic diastolic CHF: 2D echo on 78/58/8502 showed EF of 50-55%. He was placed on Lasix 40 mg IV twice daily.  There is a creatinine bumped to 2.57 today from 1.8.  BNP mildly elevated but patient also has CKD. -Repeat echocardiogram done-.  EF of 50 to 55% with grade 2 diastolic dysfunction -Continue with daily weight and strict intake and output. - REDs Vest reading of 37.echocardiogram with more than 50% variability IVC -Patient appears dry.-Keep holding diuretics.  Hypertension.  BP within goal. -Continue home medications which include amlodipine, hydralazine, metoprolol.  Hx ofCAD (coronary artery disease): no CP. Trop 16b -->16. -Continue Plavix, Crestor, Imure  Hx of Stroke [facility]): -plavix and crestor  Atrial fibrillation, chronic ([place]): Not on anticoagulants at home. -continue metoprolol  AKI with CKD (chronic kidney disease), stage IIIb.  Creatinine now stable around 3, most likely prerenal secondary to diuresis.  Creatinine around 1.7-1.8.  appears dry-gentle hydration with IV fluid. -Nephrology was consulted today as renal function continued to get worsen despite with gentle hydration-they are recommending continuation of gentle hydration. -Suspecting cardiorenal syndrome. -Continue to monitor -Avoid nephrotoxins. -Palliative care consult.  Type II diabetes mellitus with renal manifestations ([place]): Most recent A1c 12.4, poorly controled. Patient is taking Amaryl at home. CBG elevated as patient is on steroid. -Increase Lantus to 12 units twice daily. -SSI  HLD (hyperlipidemia) -Crestor  Iron deficiency anemia: Hgb 9.8 on 12/05/19 -->9.5 >>8.0>>7.2>>7.8 -f/u by CBC -Continue iron supplement. -Transfuse if below 7.  Objective: Vitals:   04/08/20 0747 04/08/20 0810 04/08/20 1147 04/08/20 1514  BP: (!) 128/57  (!) 113/54 120/60  Pulse: 79  82 80  Resp:  18  18 17   Temp: 98 F (36.7 C)  98.3 F (36.8 C) 98.4 F (36.9 C)  TempSrc:   Oral Oral  SpO2: 95% 93%  93% 96%  Weight:      Height:        Intake/Output Summary (Last 24 hours) at 04/08/2020 1534 Last data filed at 04/08/2020 1516 Gross per 24 hour  Intake 1117.25 ml  Output 2000 ml  Net -882.75 ml   Filed Weights   04/06/20 0432 04/07/20 0422 04/08/20 7858  Weight: 85.9 kg 86.7 kg 87.5 kg    Examination:  General exam: Appears calm and comfortable  Respiratory system: Clear to auscultation. Respiratory effort normal. Cardiovascular system: S1 & S2 heard, RRR. No JVD, murmurs, rubs, gallops or clicks. Gastrointestinal system: Soft, nontender, nondistended, bowel sounds positive. Central nervous system: Alert and oriented. No focal neurological deficits. Extremities: Trace LE edema, no cyanosis, pulses intact and symmetrical. Psychiatry: Judgement and insight appear normal.     DVT prophylaxis: Lovenox Code Status: DNR Family Communication: POA Claudette Head was updated on phone.  He lives with patient. Disposition Plan:  Status is: Inpatient  Remains inpatient appropriate because:Inpatient level of care appropriate due to severity of illness   Dispo: The patient is from: Home              Anticipated d/c is to: Home              Anticipated d/c date is: 2-3 days              Patient currently is not medically stable to d/c.  Worsening renal function, giving some gentle IV fluid.  Patient has used his rehab days earlier this year. Will need home health on discharge.  Suspecting cardiorenal syndrome.  Palliative care was consulted today.  Consultants:   None  Procedures:  Antimicrobials:  Rocephin Azithromycin  Data Reviewed: I have personally reviewed following labs and imaging studies  CBC: Recent Labs  Lab 04/04/20 0446 04/06/20 0447 04/07/20 0438 04/08/20 0448  WBC 15.3* 12.4* 11.8* 8.3  NEUTROABS 13.4*  --   --   --   HGB 9.5* 8.0* 7.2* 7.8*  HCT 30.6* 26.0* 23.3* 24.0*  MCV 88.7 88.7 88.3 86.0  PLT 146* 158 167 850   Basic Metabolic  Panel: Recent Labs  Lab 04/04/20 0446 04/05/20 0433 04/06/20 0447 04/07/20 0438 04/08/20 0448  NA 145 140 138 142 140  K 3.9 4.0 4.2 4.3 4.4  CL 104 100 97* 103 104  CO2 30 30 29 28 27   GLUCOSE 207* 251* 309* 230* 283*  BUN 27* 41* 71* 85* 84*  CREATININE 1.80* 2.57* 3.29* 3.33* 3.12*  CALCIUM 8.0* 7.5* 7.6* 7.1* 7.0*   GFR: Estimated Creatinine Clearance: 19.6 mL/min (A) (by C-G formula based on SCr of 3.12 mg/dL (H)). Liver Function Tests: Recent Labs  Lab 04/04/20 0446  AST 15  ALT 17  ALKPHOS 91  BILITOT 0.8  PROT 8.0  ALBUMIN 3.5   No results for input(s): LIPASE, AMYLASE in the last 168 hours. No results for input(s): AMMONIA in the last 168 hours. Coagulation Profile: No results for input(s): INR, PROTIME in the last 168 hours. Cardiac Enzymes: No results for input(s): CKTOTAL, CKMB, CKMBINDEX, TROPONINI in the last 168 hours. BNP (last 3 results) No results for input(s): PROBNP in the last 8760 hours. HbA1C: No results for input(s): HGBA1C in the last 72 hours. CBG: Recent Labs  Lab 04/07/20 1136 04/07/20 1631 04/07/20 2114 04/08/20  0750 04/08/20 1148  GLUCAP 216* 272* 343* 233* 184*   Lipid Profile: No results for input(s): CHOL, HDL, LDLCALC, TRIG, CHOLHDL, LDLDIRECT in the last 72 hours. Thyroid Function Tests: No results for input(s): TSH, T4TOTAL, FREET4, T3FREE, THYROIDAB in the last 72 hours. Anemia Panel: No results for input(s): VITAMINB12, FOLATE, FERRITIN, TIBC, IRON, RETICCTPCT in the last 72 hours. Sepsis Labs: Recent Labs  Lab 04/04/20 0446 04/04/20 0447 04/05/20 0433 04/06/20 0447  PROCALCITON <0.10  --  0.31 0.24  LATICACIDVEN  --  1.0  --   --     Recent Results (from the past 240 hour(s))  Blood culture (routine x 2)     Status: None (Preliminary result)   Collection Time: 04/04/20  4:47 AM   Specimen: Right Antecubital; Blood  Result Value Ref Range Status   Specimen Description RIGHT ANTECUBITAL  Final   Special  Requests   Final    BOTTLES DRAWN AEROBIC AND ANAEROBIC Blood Culture adequate volume   Culture   Final    NO GROWTH 4 DAYS Performed at [facility], [address], [place], [place] [address]    Report Status PENDING  Incomplete  Respiratory Panel by RT PCR (Flu A&B, Covid) - Nasopharyngeal Swab     Status: None   Collection Time: 04/04/20  4:48 AM   Specimen: Nasopharyngeal Swab  Result Value Ref Range Status   SARS Coronavirus 2 by RT PCR NEGATIVE NEGATIVE Final    Comment: (NOTE) SARS-CoV-2 target nucleic acids are NOT DETECTED. The SARS-CoV-2 RNA is generally detectable in upper respiratoy specimens during the acute phase of infection. The lowest concentration of SARS-CoV-2 viral copies this assay can detect is 131 copies/mL. A negative result does not preclude SARS-Cov-2 infection and should not be used as the sole basis for treatment or other patient management decisions. A negative result may occur with  improper specimen collection/handling, submission of specimen other than nasopharyngeal swab, presence of viral mutation(s) within the areas targeted by this assay, and inadequate number of viral copies (<131 copies/mL). A negative result must be combined with clinical observations, patient history, and epidemiological information. The expected result is Negative. Fact Sheet for Patients:  PinkCheek.be Fact Sheet for Healthcare Providers:  GravelBags.it This test is not yet ap proved or cleared by the [place] FDA and  has been authorized for detection and/or diagnosis of SARS-CoV-2 by FDA under an Emergency Use Authorization (EUA). This EUA will remain  in effect (meaning this test can be used) for the duration of the COVID-19 declaration under Section 564(b)(1) of the Act, 21 U.S.C. section 360bbb-3(b)(1), unless the authorization is terminated or revoked sooner.    Influenza A by PCR NEGATIVE  NEGATIVE Final   Influenza B by PCR NEGATIVE NEGATIVE Final    Comment: (NOTE) The Xpert Xpress SARS-CoV-2/FLU/RSV assay is intended as an aid in  the diagnosis of influenza from Nasopharyngeal swab specimens and  should not be used as a sole basis for treatment. Nasal washings and  aspirates are unacceptable for Xpert Xpress SARS-CoV-2/FLU/RSV  testing. Fact Sheet for Patients: PinkCheek.be Fact Sheet for Healthcare Providers: GravelBags.it This test is not yet approved or cleared by the [place] FDA and  has been authorized for detection and/or diagnosis of SARS-CoV-2 by  FDA under an Emergency Use Authorization (EUA). This EUA will remain  in effect (meaning this test can be used) for the duration of the  Covid-19 declaration under Section 564(b)(1) of the Act, 21  U.S.C. section  360bbb-3(b)(1), unless the authorization is  terminated or revoked. Performed at [facility], [address] [place], [place] [address]   Blood culture (routine x 2)     Status: None (Preliminary result)   Collection Time: 04/04/20  4:52 AM   Specimen: BLOOD LEFT WRIST  Result Value Ref Range Status   Specimen Description BLOOD LEFT WRIST  Final   Special Requests   Final    BOTTLES DRAWN AEROBIC AND ANAEROBIC Blood Culture adequate volume   Culture   Final    NO GROWTH 4 DAYS Performed at [facility], [address] [place], [place] [address]    Report Status PENDING  Incomplete  Urine culture     Status: None   Collection Time: 04/04/20  6:28 AM   Specimen: Urine, Random  Result Value Ref Range Status   Specimen Description   Final    URINE, RANDOM Performed at [facility], [address], [place], [place] [address]    Special Requests   Final    NONE Performed at [facility], [address], [place], [place] [address]    Culture   Final    NO GROWTH Performed at [facility], [place] [address], [place], [place] [address]    Report Status 04/05/2020 FINAL  Final     Radiology Studies: US RENAL  Result Date: 04/07/2020 CLINICAL DATA:  AKI EXAM: RENAL / URINARY TRACT ULTRASOUND COMPLETE COMPARISON:  None. FINDINGS: Right Kidney: Renal measurements: 10.2 x 5 x 4.5 cm = volume: 120.4 mL . Echogenicity within normal limits. Two small cysts. No solid mass or hydronephrosis visualized. Left Kidney: Renal measurements: 11.1 x 5.8 x 4.7 cm = volume: 155.8 mL. Echogenicity within normal limits. No mass or hydronephrosis visualized. Bladder: Appears normal for degree of bladder distention. Other: None. IMPRESSION: No hydronephrosis. Electronically Signed   By: Macy Mis M.D.   On: 04/07/2020 09:31    Scheduled Meds: . amLODipine  5 mg Oral Daily  . clopidogrel  75 mg Oral Daily  . enoxaparin (LOVENOX) injection  30 mg Subcutaneous Q24H  . ferrous sulfate  325 mg Oral BID WC  . gabapentin  100 mg Oral BID  . hydrALAZINE  100 mg Oral BID  . insulin aspart  0-15 Units Subcutaneous TID WC  . insulin aspart  0-5 Units Subcutaneous QHS  . insulin glargine  12 Units Subcutaneous BID  . ipratropium-albuterol  3 mL Nebulization Q6H  . isosorbide mononitrate  30 mg Oral Daily  . metoprolol  200 mg Oral Daily  . pantoprazole  40 mg Oral Daily  . polyethylene glycol  17 g Oral Daily  . rosuvastatin  40 mg Oral Daily  . sodium chloride flush  3 mL Intravenous Q12H   Continuous Infusions: . sodium chloride    . sodium chloride 40 mL/hr at 04/08/20 0608  . cefTRIAXone (ROCEPHIN)  IV 1 g (04/08/20 0806)     LOS: 4 days   Time spent: 40 minutes.  Lorella Nimrod, MD [facility]  If 7PM-7AM, please contact night-coverage [email or web address]  04/08/2020, 3:34 PM   This record has been created using Systems analyst. Errors have been sought and corrected,but may not always be located. Such creation errors do not reflect on the standard of care.

## 2020-04-09 LAB — BASIC METABOLIC PANEL
Anion gap: 7 (ref 5–15)
BUN: 78 mg/dL — ABNORMAL HIGH (ref 8–23)
CO2: 26 mmol/L (ref 22–32)
Calcium: 7.1 mg/dL — ABNORMAL LOW (ref 8.9–10.3)
Chloride: 109 mmol/L (ref 98–111)
Creatinine, Ser: 2.93 mg/dL — ABNORMAL HIGH (ref 0.61–1.24)
GFR calc Af Amer: 22 mL/min — ABNORMAL LOW (ref >60)
GFR calc non Af Amer: 19 mL/min — ABNORMAL LOW (ref >60)
Glucose, Bld: 122 mg/dL — ABNORMAL HIGH (ref 70–99)
Potassium: 4.4 mmol/L (ref 3.5–5.1)
Sodium: 142 mmol/L (ref 135–145)

## 2020-04-09 LAB — CULTURE, BLOOD (ROUTINE X 2)
Culture: NO GROWTH
Culture: NO GROWTH
Special Requests: ADEQUATE
Special Requests: ADEQUATE

## 2020-04-09 LAB — GLUCOSE, CAPILLARY
Glucose-Capillary: 79 mg/dL (ref 70–99)
Glucose-Capillary: 158 mg/dL — ABNORMAL HIGH (ref 70–99)
Glucose-Capillary: 144 mg/dL — ABNORMAL HIGH (ref 70–99)
Glucose-Capillary: 132 mg/dL — ABNORMAL HIGH (ref 70–99)

## 2020-04-09 LAB — CBC
HCT: 24.3 % — ABNORMAL LOW (ref 39.0–52.0)
Hemoglobin: 7.8 g/dL — ABNORMAL LOW (ref 13.0–17.0)
MCH: 27.9 pg (ref 26.0–34.0)
MCHC: 32.1 g/dL (ref 30.0–36.0)
MCV: 86.8 fL (ref 80.0–100.0)
Platelets: 154 10*3/uL (ref 150–400)
RBC: 2.8 MIL/uL — ABNORMAL LOW (ref 4.22–5.81)
RDW: 15.2 % (ref 11.5–15.5)
WBC: 8.4 10*3/uL (ref 4.0–10.5)
nRBC: 0 % (ref 0.0–0.2)

## 2020-04-09 MED ORDER — ROSUVASTATIN CALCIUM 10 MG PO TABS
10.0000 mg | ORAL_TABLET | Freq: Every day | ORAL | Status: DC
  Administered 2020-04-10 – 2020-04-15 (×6): 10 mg via ORAL
  Filled 2020-04-09 (×6): qty 1

## 2020-04-09 NOTE — Progress Notes (Signed)
Palliative:  Attempting to see Brett [name] twice today, he was otherwise occupied on both occasions.  PMT to continue follow.  Conference with transition of care team related to patient condition, needs.  No charge  Quinn Axe, NP Palliative Medicine Team Team Phone # [phone] Greater than 50% of this time was spent counseling and coordinating care related to the above assessment and plan.

## 2020-04-09 NOTE — Progress Notes (Signed)
PROGRESS NOTE    Brett [name]  IRW:[id number] DOB: 01-07-1935 DOA: 04/04/2020 PCP: Perrin Maltese, MD   Brief Narrative:  Brett [name] is a 85 y.o. male with medical history significant of hypertension, hyperlipidemia, diabetes mellitus, COPD on 2 L oxygen, stroke, GERD, PVD, PAD, iron deficiency anemia, dCHF, CAD, mitral valve regurgitation, aortic valve regurgitation, B-cell lymphoma (s/p of chemotherapy), atrial fibrillation not on anticoagulants, who presents with shortness breath.  He was found to be hypoxic at 72%, initially treated with BiPAP overnight, now saturating well on 4 L. He was also given IV Lasix for a possible CHF exacerbation, resulted in AKI.   Subjective: Patient has no new complaints .  Very hard of hearing, only hears from left ear.   Assessment & Plan:   Principal Problem:   Acute on chronic respiratory failure with hypoxemia (HCC) Active Problems:   HTN (hypertension)   CAD (coronary artery disease)   Stroke (HCC)   CAP (community acquired pneumonia)   COPD exacerbation (HCC)   Atrial fibrillation, chronic (HCC)   CHF, acute on chronic (HCC)   CKD (chronic kidney disease), stage IIIb   Type II diabetes mellitus with renal manifestations (HCC)   HLD (hyperlipidemia)   Acute on chronic diastolic CHF (congestive heart failure) (HCC)   Iron deficiency anemia   AKI (acute kidney injury) ([place])   Goals of care, counseling/discussion   Palliative care by specialist  Acute on chronic respiratory failure with hypoxemia (HCC)/COPD exacerbation and possible CAP.  Respiratory status seems improving, now saturating well on 4 L, still up from his baseline of 2 L.  There was no wheezing today.  Strep pneumococcal antigen was negative.  Blood cultures negative.  Legionella negative.PCT 0.31>>0.24 -Continue with bronchodilators. -s/p Ceftriaxone for 5 days and  and azithromycin x 3 days  -Completed the course of prednisone.  Acute on chronic diastolic CHF:  2D echo on 11/18/2019 showed EF of 50-55%. He was placed on Lasix 40 mg IV twice daily.   creatinine 1.8 =>bumped to 2.57=>2.93  BNP mildly elevated but patient also has CKD. -Repeat echocardiogram done-.  EF of 50 to 55% with grade 2 diastolic dysfunction -Continue with daily weight and strict intake and output. - REDs Vest reading of 37.echocardiogram with more than 50% variability IVC -Patient appears dry.-Keep holding diuretics.  Hypertension.  BP within goal. -Continue home medications which include amlodipine, hydralazine, metoprolol.  Hx of CAD (coronary artery disease): no CP. Trop 16b -->16. -Continue Plavix, Crestor, Imure  Hx of Stroke [facility]): -plavix and crestor  Atrial fibrillation, chronic ([place]): Not on anticoagulants at home. -continue metoprolol  AKI with CKD (chronic kidney disease), stage IIIb.  most likely prerenal secondary to diuresis.  Creatinine around 1.7-1.8.  appears dry-gentle hydration with IV fluid. -Nephrology was consulted as renal function continued to get worsen despite with gentle hydration-they are recommending continuation of gentle hydration. -Suspecting cardiorenal syndrome. -Continue to monitor -Avoid nephrotoxins.  -Palliative care consult.  Type II diabetes mellitus with renal manifestations ([place]): Most recent A1c 12.4, poorly controled. Patient is taking Amaryl at home. CBG elevated as patient is on steroid. -Increase Lantus to 12 units twice daily. -SSI  HLD (hyperlipidemia) -Crestor  Iron deficiency anemia: Hgb 9.8 on 12/05/19 -->9.5 >>8.0>>7.2>>7.8 -f/u by CBC -Continue iron supplement. -Transfuse if below 7.   Objective: Vitals:   04/08/20 2153 04/09/20 0729 04/09/20 0805 04/09/20 1348  BP: (!) 115/59  (!) 131/58   Pulse: 83  69   Resp: 18  17   Temp: 98 F (36.7 C)  98.5 F (36.9 C)   TempSrc: Oral  Oral   SpO2: 96% 95% 95% 92%  Weight:      Height:        Intake/Output Summary (Last 24 hours) at 04/09/2020  1406 Last data filed at 04/09/2020 1405 Gross per 24 hour  Intake 960 ml  Output 1550 ml  Net -590 ml   Filed Weights   04/06/20 0432 04/07/20 0422 04/08/20 7824  Weight: 85.9 kg 86.7 kg 87.5 kg    Examination:  General exam: Appears calm and comfortable  Respiratory system: Clear to auscultation. Respiratory effort normal. Cardiovascular system: S1 & S2 heard, RRR. No JVD, murmurs, rubs, gallops or clicks. Gastrointestinal system: Soft, nontender, nondistended, bowel sounds positive. Central nervous system: Alert and oriented. No focal neurological deficits. Extremities: Trace LE edema, no cyanosis, pulses intact and symmetrical. Psychiatry: Judgement and insight appear normal.     DVT prophylaxis: Lovenox Code Status: DNR Family Communication: POA Claudette Head was updated on phone.  He lives with patient. Disposition Plan:  Status is: Inpatient  Remains inpatient appropriate because:Inpatient level of care appropriate due to severity of illness   Dispo: The patient is from: Home              Anticipated d/c is to: Home              Anticipated d/c date is: 2-3 days              Patient currently is not medically stable to d/c.  Worsening renal function, giving some gentle IV fluid.  Patient has used his rehab days earlier this year. Will need home health on discharge.  Suspecting cardiorenal syndrome.  Palliative care was consulted. Monitor renal functions and follow nephrologist for discharge planning  Consultants:   None  Procedures:  Antimicrobials:  Rocephin Azithromycin  Data Reviewed: I have personally reviewed following labs and imaging studies  CBC: Recent Labs  Lab 04/04/20 0446 04/06/20 0447 04/07/20 0438 04/08/20 0448 04/09/20 0444  WBC 15.3* 12.4* 11.8* 8.3 8.4  NEUTROABS 13.4*  --   --   --   --   HGB 9.5* 8.0* 7.2* 7.8* 7.8*  HCT 30.6* 26.0* 23.3* 24.0* 24.3*  MCV 88.7 88.7 88.3 86.0 86.8  PLT 146* 158 167 160 235   Basic Metabolic  Panel: Recent Labs  Lab 04/05/20 0433 04/06/20 0447 04/07/20 0438 04/08/20 0448 04/09/20 0444  NA 140 138 142 140 142  K 4.0 4.2 4.3 4.4 4.4  CL 100 97* 103 104 109  CO2 30 29 28 27 26   GLUCOSE 251* 309* 230* 283* 122*  BUN 41* 71* 85* 84* 78*  CREATININE 2.57* 3.29* 3.33* 3.12* 2.93*  CALCIUM 7.5* 7.6* 7.1* 7.0* 7.1*   GFR: Estimated Creatinine Clearance: 20.9 mL/min (A) (by C-G formula based on SCr of 2.93 mg/dL (H)). Liver Function Tests: Recent Labs  Lab 04/04/20 0446  AST 15  ALT 17  ALKPHOS 91  BILITOT 0.8  PROT 8.0  ALBUMIN 3.5   No results for input(s): LIPASE, AMYLASE in the last 168 hours. No results for input(s): AMMONIA in the last 168 hours. Coagulation Profile: No results for input(s): INR, PROTIME in the last 168 hours. Cardiac Enzymes: No results for input(s): CKTOTAL, CKMB, CKMBINDEX, TROPONINI in the last 168 hours. BNP (last 3 results) No results for input(s): PROBNP in the last 8760 hours. HbA1C: No results for input(s): HGBA1C in the last 72  hours. CBG: Recent Labs  Lab 04/08/20 1647 04/08/20 2105 04/08/20 2149 04/09/20 0807 04/09/20 1127  GLUCAP 153* 130* 159* 79 158*   Lipid Profile: No results for input(s): CHOL, HDL, LDLCALC, TRIG, CHOLHDL, LDLDIRECT in the last 72 hours. Thyroid Function Tests: No results for input(s): TSH, T4TOTAL, FREET4, T3FREE, THYROIDAB in the last 72 hours. Anemia Panel: No results for input(s): VITAMINB12, FOLATE, FERRITIN, TIBC, IRON, RETICCTPCT in the last 72 hours. Sepsis Labs: Recent Labs  Lab 04/04/20 0446 04/04/20 0447 04/05/20 0433 04/06/20 0447  PROCALCITON <0.10  --  0.31 0.24  LATICACIDVEN  --  1.0  --   --     Recent Results (from the past 240 hour(s))  Blood culture (routine x 2)     Status: None   Collection Time: 04/04/20  4:47 AM   Specimen: Right Antecubital; Blood  Result Value Ref Range Status   Specimen Description RIGHT ANTECUBITAL  Final   Special Requests   Final     BOTTLES DRAWN AEROBIC AND ANAEROBIC Blood Culture adequate volume   Culture   Final    NO GROWTH 5 DAYS Performed at [facility], [address] [place], [place] [address]    Report Status 04/09/2020 FINAL  Final  Respiratory Panel by RT PCR (Flu A&B, Covid) - Nasopharyngeal Swab     Status: None   Collection Time: 04/04/20  4:48 AM   Specimen: Nasopharyngeal Swab  Result Value Ref Range Status   SARS Coronavirus 2 by RT PCR NEGATIVE NEGATIVE Final    Comment: (NOTE) SARS-CoV-2 target nucleic acids are NOT DETECTED. The SARS-CoV-2 RNA is generally detectable in upper respiratoy specimens during the acute phase of infection. The lowest concentration of SARS-CoV-2 viral copies this assay can detect is 131 copies/mL. A negative result does not preclude SARS-Cov-2 infection and should not be used as the sole basis for treatment or other patient management decisions. A negative result may occur with  improper specimen collection/handling, submission of specimen other than nasopharyngeal swab, presence of viral mutation(s) within the areas targeted by this assay, and inadequate number of viral copies (<131 copies/mL). A negative result must be combined with clinical observations, patient history, and epidemiological information. The expected result is Negative. Fact Sheet for Patients:  PinkCheek.be Fact Sheet for Healthcare Providers:  GravelBags.it This test is not yet ap proved or cleared by the [place] FDA and  has been authorized for detection and/or diagnosis of SARS-CoV-2 by FDA under an Emergency Use Authorization (EUA). This EUA will remain  in effect (meaning this test can be used) for the duration of the COVID-19 declaration under Section 564(b)(1) of the Act, 21 U.S.C. section 360bbb-3(b)(1), unless the authorization is terminated or revoked sooner.    Influenza A by PCR NEGATIVE NEGATIVE Final    Influenza B by PCR NEGATIVE NEGATIVE Final    Comment: (NOTE) The Xpert Xpress SARS-CoV-2/FLU/RSV assay is intended as an aid in  the diagnosis of influenza from Nasopharyngeal swab specimens and  should not be used as a sole basis for treatment. Nasal washings and  aspirates are unacceptable for Xpert Xpress SARS-CoV-2/FLU/RSV  testing. Fact Sheet for Patients: PinkCheek.be Fact Sheet for Healthcare Providers: GravelBags.it This test is not yet approved or cleared by the [place] FDA and  has been authorized for detection and/or diagnosis of SARS-CoV-2 by  FDA under an Emergency Use Authorization (EUA). This EUA will remain  in effect (meaning this test can be used) for the duration of the  Covid-19 declaration under Section 564(b)(1) of the Act, 21  U.S.C. section 360bbb-3(b)(1), unless the authorization is  terminated or revoked. Performed at [facility], [address] [place], [place] [address]   Blood culture (routine x 2)     Status: None   Collection Time: 04/04/20  4:52 AM   Specimen: BLOOD LEFT WRIST  Result Value Ref Range Status   Specimen Description BLOOD LEFT WRIST  Final   Special Requests   Final    BOTTLES DRAWN AEROBIC AND ANAEROBIC Blood Culture adequate volume   Culture   Final    NO GROWTH 5 DAYS Performed at [facility], [address], [place], [place] [address]    Report Status 04/09/2020 FINAL  Final  Urine culture     Status: None   Collection Time: 04/04/20  6:28 AM   Specimen: Urine, Random  Result Value Ref Range Status   Specimen Description   Final    URINE, RANDOM Performed at [facility], [address], [place], [place] [address]    Special Requests   Final    NONE Performed at [facility], [address], [place], [place] [address]    Culture   Final    NO GROWTH Performed at [facility], [place] [address], [place],   [address]    Report Status 04/05/2020 FINAL  Final     Radiology Studies: No results found.  Scheduled Meds: . amLODipine  5 mg Oral Daily  . clopidogrel  75 mg Oral Daily  . enoxaparin (LOVENOX) injection  30 mg Subcutaneous Q24H  . ferrous sulfate  325 mg Oral BID WC  . gabapentin  100 mg Oral BID  . hydrALAZINE  100 mg Oral BID  . insulin aspart  0-15 Units Subcutaneous TID WC  . insulin aspart  0-5 Units Subcutaneous QHS  . insulin glargine  12 Units Subcutaneous BID  . ipratropium-albuterol  3 mL Nebulization TID  . isosorbide mononitrate  30 mg Oral Daily  . metoprolol  200 mg Oral Daily  . pantoprazole  40 mg Oral Daily  . polyethylene glycol  17 g Oral Daily  . [START ON 04/10/2020] rosuvastatin  10 mg Oral Daily  . sodium chloride flush  3 mL Intravenous Q12H   Continuous Infusions: . sodium chloride    . sodium chloride 40 mL/hr at 04/08/20 2201     LOS: 5 days   Time spent: 40 minutes.  Val Riles, MD [facility]  If 7PM-7AM, please contact night-coverage [email or web address]  04/09/2020, 2:06 PM   This record has been created using Systems analyst. Errors have been sought and corrected,but may not always be located. Such creation errors do not reflect on the standard of care.

## 2020-04-09 NOTE — TOC Progression Note (Signed)
Transition of Care [facility]) - Progression Note    Patient Details  Name: Brett [name] MRN: [id number] Date of Birth: 19-Feb-1935  Transition of Care [facility]) CM/SW [place], RN Phone Number: 04/09/2020, 9:34 AM  Clinical Narrative:    Attempted to reach out to the [facility], there is no VM Set up for the [facility], Will try again.   Expected Discharge Plan: [place]    Expected Discharge Plan and Services Expected Discharge Plan: [place]       Living arrangements for the past 2 months: Single Family Home                                       Social Determinants of Health (SDOH) Interventions    Readmission Risk Interventions Readmission Risk Prevention Plan 12/05/2019  PCP or Specialist appointment within 3-5 days of discharge Complete  HRI or [place] (No Data)  SW Recovery Care/Counseling Consult Complete  Palliative Care Screening Not Applicable  Skilled Nursing Facility Complete  Some recent data might be hidden

## 2020-04-09 NOTE — Progress Notes (Signed)
[facility]  ROUNDING NOTE   Subjective:  Good urine output at 2.4 L. BUN down to 78 with a creatinine of 2.93.  Objective:  Vital signs in last 24 hours:  Temp:  [97.9 F (36.6 C)-98.5 F (36.9 C)] 98.5 F (36.9 C) (05/12 0805) Pulse Rate:  [69-85] 69 (05/12 0805) Resp:  [17-20] 17 (05/12 0805) BP: (115-131)/(58-62) 131/58 (05/12 0805) SpO2:  [92 %-96 %] 92 % (05/12 1348)  Weight change:  Filed Weights   04/06/20 0432 04/07/20 0422 04/08/20 9937  Weight: 85.9 kg 86.7 kg 87.5 kg    Intake/Output: I/O last 3 completed shifts: In: 1200 [P.O.:1200] Out: 3300 [Urine:3300]   Intake/Output this shift:  Total I/O In: 720 [P.O.:720] Out: -   Physical Exam: General: No acute distress  Head: Normocephalic, atraumatic. Moist oral mucosal membranes  Eyes: Anicteric  Neck: Supple, trachea midline  Lungs:  Clear to auscultation, normal effort  Heart: S1S2 no rubs  Abdomen:  Soft, nontender, bowel sounds present  Extremities: Trace peripheral edema.  Neurologic: Awake, alert, extremely hard of hearing  Skin: No lesions       Basic Metabolic Panel: Recent Labs  Lab 04/05/20 0433 04/05/20 0433 04/06/20 0447 04/06/20 0447 04/07/20 0438 04/08/20 0448 04/09/20 0444  NA 140  --  138  --  142 140 142  K 4.0  --  4.2  --  4.3 4.4 4.4  CL 100  --  97*  --  103 104 109  CO2 30  --  29  --  28 27 26   GLUCOSE 251*  --  309*  --  230* 283* 122*  BUN 41*  --  71*  --  85* 84* 78*  CREATININE 2.57*  --  3.29*  --  3.33* 3.12* 2.93*  CALCIUM 7.5*   < > 7.6*   < > 7.1* 7.0* 7.1*   < > = values in this interval not displayed.    Liver Function Tests: Recent Labs  Lab 04/04/20 0446  AST 15  ALT 17  ALKPHOS 91  BILITOT 0.8  PROT 8.0  ALBUMIN 3.5   No results for input(s): LIPASE, AMYLASE in the last 168 hours. No results for input(s): AMMONIA in the last 168 hours.  CBC: Recent Labs  Lab 04/04/20 0446 04/06/20 0447 04/07/20 0438 04/08/20 0448  04/09/20 0444  WBC 15.3* 12.4* 11.8* 8.3 8.4  NEUTROABS 13.4*  --   --   --   --   HGB 9.5* 8.0* 7.2* 7.8* 7.8*  HCT 30.6* 26.0* 23.3* 24.0* 24.3*  MCV 88.7 88.7 88.3 86.0 86.8  PLT 146* 158 167 160 154    Cardiac Enzymes: No results for input(s): CKTOTAL, CKMB, CKMBINDEX, TROPONINI in the last 168 hours.  BNP: Invalid input(s): POCBNP  CBG: Recent Labs  Lab 04/08/20 1647 04/08/20 2105 04/08/20 2149 04/09/20 0807 04/09/20 1127  GLUCAP 153* 130* 159* 89 158*    Microbiology: Results for orders placed or performed during the hospital encounter of 04/04/20  Blood culture (routine x 2)     Status: None   Collection Time: 04/04/20  4:47 AM   Specimen: Right Antecubital; Blood  Result Value Ref Range Status   Specimen Description RIGHT ANTECUBITAL  Final   Special Requests   Final    BOTTLES DRAWN AEROBIC AND ANAEROBIC Blood Culture adequate volume   Culture   Final    NO GROWTH 5 DAYS Performed at [facility], [address] [place], [place] [address]  Report Status 04/09/2020 FINAL  Final  Respiratory Panel by RT PCR (Flu A&B, Covid) - Nasopharyngeal Swab     Status: None   Collection Time: 04/04/20  4:48 AM   Specimen: Nasopharyngeal Swab  Result Value Ref Range Status   SARS Coronavirus 2 by RT PCR NEGATIVE NEGATIVE Final    Comment: (NOTE) SARS-CoV-2 target nucleic acids are NOT DETECTED. The SARS-CoV-2 RNA is generally detectable in upper respiratoy specimens during the acute phase of infection. The lowest concentration of SARS-CoV-2 viral copies this assay can detect is 131 copies/mL. A negative result does not preclude SARS-Cov-2 infection and should not be used as the sole basis for treatment or other patient management decisions. A negative result may occur with  improper specimen collection/handling, submission of specimen other than nasopharyngeal swab, presence of viral mutation(s) within the areas targeted by this assay, and inadequate  number of viral copies (<131 copies/mL). A negative result must be combined with clinical observations, patient history, and epidemiological information. The expected result is Negative. Fact Sheet for Patients:  PinkCheek.be Fact Sheet for Healthcare Providers:  GravelBags.it This test is not yet ap proved or cleared by the [place] FDA and  has been authorized for detection and/or diagnosis of SARS-CoV-2 by FDA under an Emergency Use Authorization (EUA). This EUA will remain  in effect (meaning this test can be used) for the duration of the COVID-19 declaration under Section 564(b)(1) of the Act, 21 U.S.C. section 360bbb-3(b)(1), unless the authorization is terminated or revoked sooner.    Influenza A by PCR NEGATIVE NEGATIVE Final   Influenza B by PCR NEGATIVE NEGATIVE Final    Comment: (NOTE) The Xpert Xpress SARS-CoV-2/FLU/RSV assay is intended as an aid in  the diagnosis of influenza from Nasopharyngeal swab specimens and  should not be used as a sole basis for treatment. Nasal washings and  aspirates are unacceptable for Xpert Xpress SARS-CoV-2/FLU/RSV  testing. Fact Sheet for Patients: PinkCheek.be Fact Sheet for Healthcare Providers: GravelBags.it This test is not yet approved or cleared by the [place] FDA and  has been authorized for detection and/or diagnosis of SARS-CoV-2 by  FDA under an Emergency Use Authorization (EUA). This EUA will remain  in effect (meaning this test can be used) for the duration of the  Covid-19 declaration under Section 564(b)(1) of the Act, 21  U.S.C. section 360bbb-3(b)(1), unless the authorization is  terminated or revoked. Performed at [facility], [address] [place], [place] [address]   Blood culture (routine x 2)     Status: None   Collection Time: 04/04/20  4:52 AM   Specimen: BLOOD LEFT WRIST   Result Value Ref Range Status   Specimen Description BLOOD LEFT WRIST  Final   Special Requests   Final    BOTTLES DRAWN AEROBIC AND ANAEROBIC Blood Culture adequate volume   Culture   Final    NO GROWTH 5 DAYS Performed at [facility], [address], [place], [place] [address]    Report Status 04/09/2020 FINAL  Final  Urine culture     Status: None   Collection Time: 04/04/20  6:28 AM   Specimen: Urine, Random  Result Value Ref Range Status   Specimen Description   Final    URINE, RANDOM Performed at [facility], [address] [place], [place] [address]    Special Requests   Final    NONE Performed at [facility] [address], [place], [place] [address]  Culture   Final    NO GROWTH Performed at [facility], [place] [address] [place], [place] [address]    Report Status 04/05/2020 FINAL  Final    Coagulation Studies: No results for input(s): LABPROT, INR in the last 72 hours.  Urinalysis: No results for input(s): COLORURINE, LABSPEC, PHURINE, GLUCOSEU, HGBUR, BILIRUBINUR, KETONESUR, PROTEINUR, UROBILINOGEN, NITRITE, LEUKOCYTESUR in the last 72 hours.  Invalid input(s): APPERANCEUR    Imaging: No results found.   Medications:   . sodium chloride    . sodium chloride 40 mL/hr at 04/08/20 2201   . amLODipine  5 mg Oral Daily  . clopidogrel  75 mg Oral Daily  . enoxaparin (LOVENOX) injection  30 mg Subcutaneous Q24H  . ferrous sulfate  325 mg Oral BID WC  . gabapentin  100 mg Oral BID  . hydrALAZINE  100 mg Oral BID  . insulin aspart  0-15 Units Subcutaneous TID WC  . insulin aspart  0-5 Units Subcutaneous QHS  . insulin glargine  12 Units Subcutaneous BID  . ipratropium-albuterol  3 mL Nebulization TID  . isosorbide mononitrate  30 mg Oral Daily  . metoprolol  200 mg Oral Daily  . pantoprazole  40 mg Oral Daily  . polyethylene glycol  17 g Oral Daily  . [START ON 04/10/2020] rosuvastatin  10 mg Oral Daily  .  sodium chloride flush  3 mL Intravenous Q12H   sodium chloride, acetaminophen, albuterol, dextromethorphan-guaiFENesin, hydrALAZINE, ondansetron (ZOFRAN) IV, sodium chloride flush  Assessment/ Plan:  85 y.o. male with a PMHx of B-cell lymphoma, aortic regurgitation, coronary artery disease, carotid artery stenosis, congestive heart failure, diabetes mellitus type 2, COPD, hypertension, hyperlipidemia, iron deficiency anemia, peptic ulcer disease, peripheral vascular disease, who was admitted to [facility] on 04/04/2020 for evaluation of shortness of breath.  1.  Acute kidney injury/chronic kidney disease stage IIIb baseline creatinine 1.79 with EGFR of 35/proteinuria.  Suspect that acute kidney injury may be due to volume depletion from earlier use of diuretics.    Renal ultrasound negative hydronephrosis.  Low-grade positive ANCA antibody however doubt ANCA related vasculitis. -Kidney function continues to improve.  BUN down to 70 with a creatinine of 2.93.  Continue gentle hydration with 0.9 normal saline at 40 cc/h.  2.  Anemia of chronic kidney disease. Lab Results  Component Value Date   HGB 7.8 (L) 04/09/2020  Hemoglobin 7.8.  Continue to monitor closely.  Consider use of Epogen as an out patient.      LOS: 5 Darragh Nay 5/12/20212:34 PM

## 2020-04-09 NOTE — TOC Progression Note (Signed)
Transition of Care [facility]) - Progression Note    Patient Details  Name: Brett [name] MRN: [id number] Date of Birth: 10/13/1935  Transition of Care [facility]) CM/SW [place], RN Phone Number: 04/09/2020, 9:48 AM  Clinical Narrative:     Met with the patient to discuss DC plan.  He lives at home with his friend and POA [name] who does a lot of his care He provides transportation and anything the patient needs, They patient is open with [facility] for RN and PT, he stated that he also does his exercises on his own, He has a rolling walker and a cane at home but doe snot use the walker unless he has to.  He is animate that he is not going to go back to rehab and is going to go home.  Will continue to follow for needs  Expected Discharge Plan: [place] Barriers to Discharge: Continued Medical Work up  Expected Discharge Plan and Services Expected Discharge Plan: [place]   Discharge Planning Services: CM Consult   Living arrangements for the past 2 months: Single Family Home                 DME Arranged: N/A             Date HH Agency Contacted: 04/09/20 Time [place]: [phone] Representative spoke with at [place]: [place] ([place]) Interventions    Readmission Risk Interventions Readmission Risk Prevention Plan 12/05/2019  PCP or Specialist appointment within 3-5 days of discharge Complete  HRI or [place] (No Data)  SW Recovery Care/Counseling Consult Complete  Palliative Care Screening Not Applicable  Skilled Nursing Facility Complete  Some recent data might be hidden

## 2020-04-10 ENCOUNTER — Inpatient Hospital Stay: Payer: Medicare PPO

## 2020-04-10 LAB — MAGNESIUM: Magnesium: 1.9 mg/dL (ref 1.7–2.4)

## 2020-04-10 LAB — BASIC METABOLIC PANEL
Anion gap: 7 (ref 5–15)
BUN: 72 mg/dL — ABNORMAL HIGH (ref 8–23)
CO2: 26 mmol/L (ref 22–32)
Calcium: 7.1 mg/dL — ABNORMAL LOW (ref 8.9–10.3)
Chloride: 113 mmol/L — ABNORMAL HIGH (ref 98–111)
Creatinine, Ser: 2.6 mg/dL — ABNORMAL HIGH (ref 0.61–1.24)
GFR calc Af Amer: 25 mL/min — ABNORMAL LOW (ref >60)
GFR calc non Af Amer: 22 mL/min — ABNORMAL LOW (ref >60)
Glucose, Bld: 68 mg/dL — ABNORMAL LOW (ref 70–99)
Potassium: 4.5 mmol/L (ref 3.5–5.1)
Sodium: 146 mmol/L — ABNORMAL HIGH (ref 135–145)

## 2020-04-10 LAB — BRAIN NATRIURETIC PEPTIDE: B Natriuretic Peptide: 301 pg/mL — ABNORMAL HIGH (ref 0.0–100.0)

## 2020-04-10 LAB — GLUCOSE, CAPILLARY
Glucose-Capillary: 63 mg/dL — ABNORMAL LOW (ref 70–99)
Glucose-Capillary: 91 mg/dL (ref 70–99)
Glucose-Capillary: 114 mg/dL — ABNORMAL HIGH (ref 70–99)
Glucose-Capillary: 164 mg/dL — ABNORMAL HIGH (ref 70–99)
Glucose-Capillary: 240 mg/dL — ABNORMAL HIGH (ref 70–99)

## 2020-04-10 LAB — CBC
HCT: 25.4 % — ABNORMAL LOW (ref 39.0–52.0)
Hemoglobin: 8.1 g/dL — ABNORMAL LOW (ref 13.0–17.0)
MCH: 27.6 pg (ref 26.0–34.0)
MCHC: 31.9 g/dL (ref 30.0–36.0)
MCV: 86.7 fL (ref 80.0–100.0)
Platelets: 143 10*3/uL — ABNORMAL LOW (ref 150–400)
RBC: 2.93 MIL/uL — ABNORMAL LOW (ref 4.22–5.81)
RDW: 15.3 % (ref 11.5–15.5)
WBC: 10 10*3/uL (ref 4.0–10.5)
nRBC: 0 % (ref 0.0–0.2)

## 2020-04-10 LAB — ALBUMIN: Albumin: 2.4 g/dL — ABNORMAL LOW (ref 3.5–5.0)

## 2020-04-10 LAB — PHOSPHORUS: Phosphorus: 4 mg/dL (ref 2.5–4.6)

## 2020-04-10 MED ORDER — INSULIN GLARGINE 100 UNIT/ML ~~LOC~~ SOLN
20.0000 [IU] | Freq: Every day | SUBCUTANEOUS | Status: DC
  Administered 2020-04-10 – 2020-04-14 (×5): 20 [IU] via SUBCUTANEOUS
  Filled 2020-04-10 (×6): qty 0.2

## 2020-04-10 MED ORDER — FUROSEMIDE 10 MG/ML IJ SOLN
3.0000 mg/h | INTRAVENOUS | Status: DC
  Administered 2020-04-10: 4 mg/h via INTRAVENOUS
  Filled 2020-04-10: qty 25

## 2020-04-10 NOTE — Progress Notes (Signed)
Brett [name]  MRN: [id number]  DOB/AGE: Apr 06, 1935 85 y.o.  Primary Care Physician:Khan, Nyra Jabs, MD  Admit date: 04/04/2020  Chief Complaint:  Chief Complaint  Patient presents with  . Respiratory Distress    S-Pt presented on  04/04/2020 with  Chief Complaint  Patient presents with  . Respiratory Distress  . Patient is hard of hearing but does not offer any new specific complaints  Medications . amLODipine  5 mg Oral Daily  . clopidogrel  75 mg Oral Daily  . enoxaparin (LOVENOX) injection  30 mg Subcutaneous Q24H  . ferrous sulfate  325 mg Oral BID WC  . gabapentin  100 mg Oral BID  . hydrALAZINE  100 mg Oral BID  . insulin aspart  0-15 Units Subcutaneous TID WC  . insulin aspart  0-5 Units Subcutaneous QHS  . insulin glargine  20 Units Subcutaneous QHS  . ipratropium-albuterol  3 mL Nebulization TID  . isosorbide mononitrate  30 mg Oral Daily  . metoprolol  200 mg Oral Daily  . pantoprazole  40 mg Oral Daily  . polyethylene glycol  17 g Oral Daily  . rosuvastatin  10 mg Oral Daily  . sodium chloride flush  3 mL Intravenous Q12H         JTT:SVXBL from the symptoms mentioned above,there are no other symptoms referable to all systems reviewed.  Physical Exam: Vital signs in last 24 hours: Temp:  [97.8 F (36.6 C)-98.3 F (36.8 C)] 98 F (36.7 C) (05/13 0800) Pulse Rate:  [79-86] 86 (05/13 0800) Resp:  [16-20] 20 (05/13 0800) BP: (108-134)/(53-65) 134/65 (05/13 0800) SpO2:  [92 %-98 %] 98 % (05/13 0800) Weight change:  Last BM Date: 04/09/20  Intake/Output from previous day: 05/12 0701 - 05/13 0700 In: 94 [P.O.:960; I.V.:280] Out: 1200 [Urine:1200] Total I/O In: 289.7 [P.O.:120; I.V.:169.7] Out: -    Physical Exam: General- pt is awake,alert, oriented to time place and person Resp- No acute REsp distress, CTA B/L NO Rhonchi CVS- S1S2 regular in rate and rhythm GIT- BS+, soft, NT, ND EXT- NO LE Edema, Cyanosis   Lab Results: CBC Recent  Labs    04/09/20 0444 04/10/20 0418  WBC 8.4 10.0  HGB 7.8* 8.1*  HCT 24.3* 25.4*  PLT 154 143*    BMET Recent Labs    04/09/20 0444 04/10/20 0418  NA 142 146*  K 4.4 4.5  CL 109 113*  CO2 26 26  GLUCOSE 122* 68*  BUN 78* 72*  CREATININE 2.93* 2.60*  CALCIUM 7.1* 7.1*    Creatinine trend 2021 3.3==> 2.6 1.8--2.1 baseline  Creatinine was 1.5 going back to 2011  MICRO Recent Results (from the past 240 hour(s))  Blood culture (routine x 2)     Status: None   Collection Time: 04/04/20  4:47 AM   Specimen: Right Antecubital; Blood  Result Value Ref Range Status   Specimen Description RIGHT ANTECUBITAL  Final   Special Requests   Final    BOTTLES DRAWN AEROBIC AND ANAEROBIC Blood Culture adequate volume   Culture   Final    NO GROWTH 5 DAYS Performed at [facility], [address] [place], [place] [address]    Report Status 04/09/2020 FINAL  Final  Respiratory Panel by RT PCR (Flu A&B, Covid) - Nasopharyngeal Swab     Status: None   Collection Time: 04/04/20  4:48 AM   Specimen: Nasopharyngeal Swab  Result Value Ref Range Status   SARS Coronavirus 2 by RT PCR  NEGATIVE NEGATIVE Final    Comment: (NOTE) SARS-CoV-2 target nucleic acids are NOT DETECTED. The SARS-CoV-2 RNA is generally detectable in upper respiratoy specimens during the acute phase of infection. The lowest concentration of SARS-CoV-2 viral copies this assay can detect is 131 copies/mL. A negative result does not preclude SARS-Cov-2 infection and should not be used as the sole basis for treatment or other patient management decisions. A negative result may occur with  improper specimen collection/handling, submission of specimen other than nasopharyngeal swab, presence of viral mutation(s) within the areas targeted by this assay, and inadequate number of viral copies (<131 copies/mL). A negative result must be combined with clinical observations, patient history, and epidemiological  information. The expected result is Negative. Fact Sheet for Patients:  PinkCheek.be Fact Sheet for Healthcare Providers:  GravelBags.it This test is not yet ap proved or cleared by the [place] FDA and  has been authorized for detection and/or diagnosis of SARS-CoV-2 by FDA under an Emergency Use Authorization (EUA). This EUA will remain  in effect (meaning this test can be used) for the duration of the COVID-19 declaration under Section 564(b)(1) of the Act, 21 U.S.C. section 360bbb-3(b)(1), unless the authorization is terminated or revoked sooner.    Influenza A by PCR NEGATIVE NEGATIVE Final   Influenza B by PCR NEGATIVE NEGATIVE Final    Comment: (NOTE) The Xpert Xpress SARS-CoV-2/FLU/RSV assay is intended as an aid in  the diagnosis of influenza from Nasopharyngeal swab specimens and  should not be used as a sole basis for treatment. Nasal washings and  aspirates are unacceptable for Xpert Xpress SARS-CoV-2/FLU/RSV  testing. Fact Sheet for Patients: PinkCheek.be Fact Sheet for Healthcare Providers: GravelBags.it This test is not yet approved or cleared by the [place] FDA and  has been authorized for detection and/or diagnosis of SARS-CoV-2 by  FDA under an Emergency Use Authorization (EUA). This EUA will remain  in effect (meaning this test can be used) for the duration of the  Covid-19 declaration under Section 564(b)(1) of the Act, 21  U.S.C. section 360bbb-3(b)(1), unless the authorization is  terminated or revoked. Performed at [facility], [address] [place], [place] [address]   Blood culture (routine x 2)     Status: None   Collection Time: 04/04/20  4:52 AM   Specimen: BLOOD LEFT WRIST  Result Value Ref Range Status   Specimen Description BLOOD LEFT WRIST  Final   Special Requests   Final    BOTTLES DRAWN AEROBIC AND  ANAEROBIC Blood Culture adequate volume   Culture   Final    NO GROWTH 5 DAYS Performed at [facility], [address] [place], [place] [address]    Report Status 04/09/2020 FINAL  Final  Urine culture     Status: None   Collection Time: 04/04/20  6:28 AM   Specimen: Urine, Random  Result Value Ref Range Status   Specimen Description   Final    URINE, RANDOM Performed at [facility], [address], [place], [place] [address]    Special Requests   Final    NONE Performed at [facility], [address], [place], [place] [address]    Culture   Final    NO GROWTH Performed at [facility], [place] [address], [place],  [address]    Report Status 04/05/2020 FINAL  Final      Lab Results  Component Value Date   CALCIUM 7.1 (L) 04/10/2020   PHOS  4.0 04/10/2020               Impression:   Patient is a 85 year old male with a past medical history of B-cell lymphoma, aortic regurgitation, CAD, CHF, diabetes mellitus type 2, COPD, hypertension, iron deficiency anemia, peptic ulcer disease, peripheral vascular disease who was admitted to the hospital on May 7 with chief complaint of shortness of breath  1)Renal  AKI secondary to ATN Patient had AKI on CKD. Patient has CKD stage IIIb/IV Patient has CKD since 2011 Patient has CKD most likely secondary to diabetes mellitus There is possible continuation hypertension/age associated decline as patient is more than 85 years old. Patient AKI is now improving Patient creatinine is trending down    2)HTN  Blood pressure is at goal  Medication-  On Calcium Channel Blockers On Beta blockers On Vasodilators- Hydralzine On Central Acting Sympatholytics- Clonidine   3)Anemia of chronic disease  HGb at goal (9--11) Patient will most likely require Epogen soon  4) hypocalcemia Patient albumin was 3.5 on May 7 We will recheck patient's albumin before giving patient calcium  supplements  5) diabetes mellitus Patient is a diabetes mellitus This is being closely followed by the primary team  6) electrolytes   sodium Hypernatremia We will encourage free water intake   potassium Normokalemic    7)Acid base Co2 at goal     Plan:  We will encourage free water intake We will check albumin-we will add to this morning labs already drawn We will continue to follow Chem-7     Brenlynn Fake s [facility] 04/10/2020, 8:24 AM

## 2020-04-10 NOTE — Plan of Care (Signed)

## 2020-04-10 NOTE — Progress Notes (Signed)
Inpatient Diabetes Program Recommendations  AACE/ADA: New Consensus Statement on Inpatient Glycemic Control (2015)  Target Ranges:  Prepandial:   less than 140 mg/dL      Peak postprandial:   less than 180 mg/dL (1-2 hours)      Critically ill patients:  140 - 180 mg/dL   Lab Results  Component Value Date   GLUCAP 91 04/10/2020   HGBA1C 12.4 (H) 11/27/2019    Review of Glycemic Control Results for EZEL, VALLONE (MRN [id number]) as of 04/10/2020 09:05  Ref. Range 04/09/2020 08:07 04/09/2020 11:27 04/09/2020 16:32 04/09/2020 21:57 04/10/2020 08:01 04/10/2020 08:31  Glucose-Capillary Latest Ref Range: 70 - 99 mg/dL 79 158 (H) 144 (H) 132 (H) 63 (L) 91   Diabetes history: DM 2 Outpatient Diabetes medications: Amaryl 4 mg BID Current orders for Inpatient glycemic control:  Lantus 20 units qhs Novolog 0-15 units tid + hs  Inpatient Diabetes Program Recommendations:    Prednisone stopped 2-3 days ago. Glucose 79 yesterday am and this morning 63 mg/dl.  Not on basal insulin at home.  Reduce Lantus to 8 units qhs.  Thanks,  Tama Headings RN, MSN, BC-ADM Inpatient Diabetes Coordinator Team Pager [phone] (8a-5p)

## 2020-04-10 NOTE — Progress Notes (Signed)
Palliative: Mr. Bloodworth is resting quietly in bed.  He is sleeping soundly, but wakes easily when I touch him and called his name.  He is alert and oriented, but very hard of hearing.  He is able to make his basic needs known.  There is no family at bedside at this time.  We talked about his acute and chronic health problems.  Mr. Siwek tells me that he has "a leaky valve".  Also talk with him about his heart failure, his "weakened heart".  We do talk briefly about cardiorenal syndrome.  But this is somewhat limited due to his hearing loss.   I share with him that we expect that he will get sick again with heart failure, and we want to make sure that we respect how he wants [place] to take care of him.  Unfortunately, Mr. Larsh is hyper focused on being able to return to his own home and not rehab.  I reassure him multiple times that everyone agrees that he should return home.  We talked about at home services with palliative care.  Mr. Crista Luria tells me that he is agreeable to outpatient palliative services.  Conference with attending, bedside nursing staff, transition of care team related to patient condition, needs, goals of care.  Plan: Home with home health and outpatient palliative services.  At this point, rehospitalize as needed.  75 minutes Quinn Axe, NP Palliative Medicine Team Team Phone # [phone] Greater than 50% of this time was spent counseling and coordinating care related to the above assessment and plan.

## 2020-04-10 NOTE — Progress Notes (Signed)
PROGRESS NOTE    Brett [name]  HCW:[id number] DOB: 1935-09-16 DOA: 04/04/2020 PCP: Perrin Maltese, MD   Brief Narrative:  Brett [name] is a 85 y.o. male with medical history significant of hypertension, hyperlipidemia, diabetes mellitus, COPD on 2 L oxygen, stroke, GERD, PVD, PAD, iron deficiency anemia, dCHF, CAD, mitral valve regurgitation, aortic valve regurgitation, B-cell lymphoma (s/p of chemotherapy), atrial fibrillation not on anticoagulants, who presents with shortness breath.  He was found to be hypoxic at 72%, initially treated with BiPAP overnight, now saturating well on 4 L. He was also given IV Lasix for a possible CHF exacerbation, resulted in AKI.   Subjective: Patient has no new complaints .  Very hard of hearing, only hears from left ear. Currently patient is on 4 L oxygen via nasal cannula  Assessment & Plan:   Principal Problem:   Acute on chronic respiratory failure with hypoxemia (HCC) Active Problems:   HTN (hypertension)   CAD (coronary artery disease)   Stroke (HCC)   CAP (community acquired pneumonia)   COPD exacerbation (HCC)   Atrial fibrillation, chronic (HCC)   CHF, acute on chronic (HCC)   CKD (chronic kidney disease), stage IIIb   Type II diabetes mellitus with renal manifestations (HCC)   HLD (hyperlipidemia)   Acute on chronic diastolic CHF (congestive heart failure) (HCC)   Iron deficiency anemia   AKI (acute kidney injury) ([place])   Goals of care, counseling/discussion   Palliative care by specialist  Acute on chronic respiratory failure with hypoxemia (HCC)/COPD exacerbation and possible CAP.  Respiratory status seems improving, now saturating well on 4 L, still up from his baseline of 2 L.  There was no wheezing today.  Strep pneumococcal antigen was negative.  Blood cultures negative.  Legionella negative.PCT 0.31>>0.24 -Continue with bronchodilators. -s/p Ceftriaxone for 5 days and  and azithromycin x 3 days  -Completed the course  of prednisone.  Acute on chronic diastolic CHF: 2D echo on 17/61/6073 showed EF of 50-55%. He was placed on Lasix 40 mg IV twice daily.   creatinine 1.8 =>bumped to 2.57=>2.93  BNP mildly elevated but patient also has CKD. -Repeat echocardiogram done-.  EF of 50 to 55% with grade 2 diastolic dysfunction -Continue with daily weight and strict intake and output. - REDs Vest reading of 37.echocardiogram with more than 50% variability IVC -Patient appears dry.-Keep holding diuretics.  Hypertension.  BP within goal. -Continue home medications which include amlodipine, hydralazine, metoprolol.  Hx of CAD (coronary artery disease): no CP. Trop 16b -->16. -Continue Plavix, Crestor, Imure  Hx of Stroke [facility]): -plavix and crestor  Atrial fibrillation, chronic ([place]): Not on anticoagulants at home. -continue metoprolol  AKI with CKD (chronic kidney disease), stage IIIb.  most likely prerenal secondary to diuresis.  Creatinine around 1.7-1.8.  appears dry-gentle hydration with IV fluid. -Nephrology was consulted as renal function continued to get worsen despite with gentle hydration-they are recommending continuation of gentle hydration. -Suspecting cardiorenal syndrome. -Continue to monitor -Avoid nephrotoxins.  -Palliative care consult. Creatinine 2.6 on 5/13 5/13 RN was advised to do bladder scan to rule out urine retention   Type II diabetes mellitus with renal manifestations [facility]): Most recent A1c 12.4, poorly controled. Patient is taking Amaryl at home. CBG elevated as patient is on steroid. - 5/13 Decreased Lantus 20 units qhs (developed Hypoglycemia on 12U BID) -SSI Decreased dose of Lantus at night if patient remains hypoglycemic   HLD (hyperlipidemia) -Crestor  Iron deficiency anemia: Hgb 9.8 on 12/05/19 -->9.5 >>  8.0>>7.2>>7.8 -f/u by CBC -Continue iron supplement. -Transfuse if below 7.   Objective: Vitals:   04/09/20 2110 04/09/20 2147 04/10/20 0020 04/10/20 0800   BP:  126/62 (!) 125/55 134/65  Pulse:  79 83 86  Resp:   16 20  Temp:   97.8 F (36.6 C) 98 F (36.7 C)  TempSrc:   Oral Axillary  SpO2: 92%  94% 98%  Weight:      Height:        Intake/Output Summary (Last 24 hours) at 04/10/2020 1154 Last data filed at 04/10/2020 1005 Gross per 24 hour  Intake 1289.73 ml  Output 1200 ml  Net 89.73 ml   Filed Weights   04/06/20 0432 04/07/20 0422 04/08/20 0608  Weight: 85.9 kg 86.7 kg 87.5 kg    Examination:  General exam: Appears calm and comfortable  Respiratory system: Clear to auscultation. Respiratory effort normal. Cardiovascular system: S1 & S2 heard, RRR. No JVD, murmurs, rubs, gallops or clicks. Gastrointestinal system: Soft, nontender, nondistended, bowel sounds positive. Central nervous system: Alert and oriented. No focal neurological deficits. Extremities: Trace LE edema, no cyanosis, pulses intact and symmetrical. Psychiatry: Judgement and insight appear normal.     DVT prophylaxis: Lovenox Code Status: DNR Family Communication: POA Claudette Head was updated on phone.  He lives with patient. Disposition Plan:  Status is: Inpatient  Remains inpatient appropriate because:Inpatient level of care appropriate due to severity of illness   Dispo: The patient is from: Home              Anticipated d/c is to: Home              Anticipated d/c date is: 2-3 days              Patient currently is not medically stable to d/c.   Patient has used his rehab days earlier this year. Will need home health on discharge.  Suspecting cardiorenal syndrome.  Palliative care was consulted. Still creatinine is elevated, gradually improving.  Patient is on IV fluids gentle hydration.  Awaiting for nephrology recommendation for disposition.     Consultants:   Nephrology  Procedures:  Antimicrobials:  Rocephin x 5 days Azithromycin x 3 days  Data Reviewed: I have personally reviewed following labs and imaging studies  CBC: Recent  Labs  Lab 04/04/20 0446 04/04/20 0446 04/06/20 0447 04/07/20 0438 04/08/20 0448 04/09/20 0444 04/10/20 0418  WBC 15.3*   < > 12.4* 11.8* 8.3 8.4 10.0  NEUTROABS 13.4*  --   --   --   --   --   --   HGB 9.5*   < > 8.0* 7.2* 7.8* 7.8* 8.1*  HCT 30.6*   < > 26.0* 23.3* 24.0* 24.3* 25.4*  MCV 88.7   < > 88.7 88.3 86.0 86.8 86.7  PLT 146*   < > 158 167 160 154 143*   < > = values in this interval not displayed.   Basic Metabolic Panel: Recent Labs  Lab 04/06/20 0447 04/07/20 0438 04/08/20 0448 04/09/20 0444 04/10/20 0418  NA 138 142 140 142 146*  K 4.2 4.3 4.4 4.4 4.5  CL 97* 103 104 109 113*  CO2 29 28 27 26 26   GLUCOSE 309* 230* 283* 122* 68*  BUN 71* 85* 84* 78* 72*  CREATININE 3.29* 3.33* 3.12* 2.93* 2.60*  CALCIUM 7.6* 7.1* 7.0* 7.1* 7.1*  MG  --   --   --   --  1.9  PHOS  --   --   --   --  4.0   GFR: Estimated Creatinine Clearance: 23.6 mL/min (A) (by C-G formula based on SCr of 2.6 mg/dL (H)). Liver Function Tests: Recent Labs  Lab 04/04/20 0446  AST 15  ALT 17  ALKPHOS 91  BILITOT 0.8  PROT 8.0  ALBUMIN 3.5   No results for input(s): LIPASE, AMYLASE in the last 168 hours. No results for input(s): AMMONIA in the last 168 hours. Coagulation Profile: No results for input(s): INR, PROTIME in the last 168 hours. Cardiac Enzymes: No results for input(s): CKTOTAL, CKMB, CKMBINDEX, TROPONINI in the last 168 hours. BNP (last 3 results) No results for input(s): PROBNP in the last 8760 hours. HbA1C: No results for input(s): HGBA1C in the last 72 hours. CBG: Recent Labs  Lab 04/09/20 1632 04/09/20 2157 04/10/20 0801 04/10/20 0831 04/10/20 1129  GLUCAP 144* 132* 63* 91 114*   Lipid Profile: No results for input(s): CHOL, HDL, LDLCALC, TRIG, CHOLHDL, LDLDIRECT in the last 72 hours. Thyroid Function Tests: No results for input(s): TSH, T4TOTAL, FREET4, T3FREE, THYROIDAB in the last 72 hours. Anemia Panel: No results for input(s): VITAMINB12, FOLATE,  FERRITIN, TIBC, IRON, RETICCTPCT in the last 72 hours. Sepsis Labs: Recent Labs  Lab 04/04/20 0446 04/04/20 0447 04/05/20 0433 04/06/20 0447  PROCALCITON <0.10  --  0.31 0.24  LATICACIDVEN  --  1.0  --   --     Recent Results (from the past 240 hour(s))  Blood culture (routine x 2)     Status: None   Collection Time: 04/04/20  4:47 AM   Specimen: Right Antecubital; Blood  Result Value Ref Range Status   Specimen Description RIGHT ANTECUBITAL  Final   Special Requests   Final    BOTTLES DRAWN AEROBIC AND ANAEROBIC Blood Culture adequate volume   Culture   Final    NO GROWTH 5 DAYS Performed at [facility], [address] [place], [place] [address]    Report Status 04/09/2020 FINAL  Final  Respiratory Panel by RT PCR (Flu A&B, Covid) - Nasopharyngeal Swab     Status: None   Collection Time: 04/04/20  4:48 AM   Specimen: Nasopharyngeal Swab  Result Value Ref Range Status   SARS Coronavirus 2 by RT PCR NEGATIVE NEGATIVE Final    Comment: (NOTE) SARS-CoV-2 target nucleic acids are NOT DETECTED. The SARS-CoV-2 RNA is generally detectable in upper respiratoy specimens during the acute phase of infection. The lowest concentration of SARS-CoV-2 viral copies this assay can detect is 131 copies/mL. A negative result does not preclude SARS-Cov-2 infection and should not be used as the sole basis for treatment or other patient management decisions. A negative result may occur with  improper specimen collection/handling, submission of specimen other than nasopharyngeal swab, presence of viral mutation(s) within the areas targeted by this assay, and inadequate number of viral copies (<131 copies/mL). A negative result must be combined with clinical observations, patient history, and epidemiological information. The expected result is Negative. Fact Sheet for Patients:  PinkCheek.be Fact Sheet for Healthcare Providers:   GravelBags.it This test is not yet ap proved or cleared by the [place] FDA and  has been authorized for detection and/or diagnosis of SARS-CoV-2 by FDA under an Emergency Use Authorization (EUA). This EUA will remain  in effect (meaning this test can be used) for the duration of the COVID-19 declaration under Section 564(b)(1) of the Act, 21 U.S.C. section 360bbb-3(b)(1), unless the authorization is terminated or revoked sooner.    Influenza A by PCR NEGATIVE NEGATIVE Final  Influenza B by PCR NEGATIVE NEGATIVE Final    Comment: (NOTE) The Xpert Xpress SARS-CoV-2/FLU/RSV assay is intended as an aid in  the diagnosis of influenza from Nasopharyngeal swab specimens and  should not be used as a sole basis for treatment. Nasal washings and  aspirates are unacceptable for Xpert Xpress SARS-CoV-2/FLU/RSV  testing. Fact Sheet for Patients: PinkCheek.be Fact Sheet for Healthcare Providers: GravelBags.it This test is not yet approved or cleared by the [place] FDA and  has been authorized for detection and/or diagnosis of SARS-CoV-2 by  FDA under an Emergency Use Authorization (EUA). This EUA will remain  in effect (meaning this test can be used) for the duration of the  Covid-19 declaration under Section 564(b)(1) of the Act, 21  U.S.C. section 360bbb-3(b)(1), unless the authorization is  terminated or revoked. Performed at [facility], [address] [place], [place] [address]   Blood culture (routine x 2)     Status: None   Collection Time: 04/04/20  4:52 AM   Specimen: BLOOD LEFT WRIST  Result Value Ref Range Status   Specimen Description BLOOD LEFT WRIST  Final   Special Requests   Final    BOTTLES DRAWN AEROBIC AND ANAEROBIC Blood Culture adequate volume   Culture   Final    NO GROWTH 5 DAYS Performed at [facility], [address], [place], [place]  [address]    Report Status 04/09/2020 FINAL  Final  Urine culture     Status: None   Collection Time: 04/04/20  6:28 AM   Specimen: Urine, Random  Result Value Ref Range Status   Specimen Description   Final    URINE, RANDOM Performed at [facility] [address], [place], [place] [address]    Special Requests   Final    NONE Performed at [facility], [address], [place], [place] [address]    Culture   Final    NO GROWTH Performed at [facility], [place] [address] [place], [place] [address]    Report Status 04/05/2020 FINAL  Final     Radiology Studies: No results found.  Scheduled Meds: . amLODipine  5 mg Oral Daily  . clopidogrel  75 mg Oral Daily  . enoxaparin (LOVENOX) injection  30 mg Subcutaneous Q24H  . ferrous sulfate  325 mg Oral BID WC  . gabapentin  100 mg Oral BID  . hydrALAZINE  100 mg Oral BID  . insulin aspart  0-15 Units Subcutaneous TID WC  . insulin aspart  0-5 Units Subcutaneous QHS  . insulin glargine  20 Units Subcutaneous QHS  . ipratropium-albuterol  3 mL Nebulization TID  . isosorbide mononitrate  30 mg Oral Daily  . metoprolol  200 mg Oral Daily  . pantoprazole  40 mg Oral Daily  . polyethylene glycol  17 g Oral Daily  . rosuvastatin  10 mg Oral Daily  . sodium chloride flush  3 mL Intravenous Q12H   Continuous Infusions: . sodium chloride    . sodium chloride 40 mL/hr at 04/10/20 0815     LOS: 6 days   Time spent: 40 minutes.  Val Riles, MD [facility]  If 7PM-7AM, please contact night-coverage [email or web address]  04/10/2020, 11:54 AM   This record has been created using Systems analyst. Errors have been sought and corrected,but may not always be located. Such creation errors do not reflect on the standard of care.

## 2020-04-10 NOTE — Progress Notes (Signed)
Hypoglycemic Event  CBG: 63  Treatment: 4 oz orange juice  Symptoms: none   Follow-up CBG: Time: 0831 CBG Result: 91  Possible Reasons for Event:   Comments/MD notified: Dr Dwyane Dee during rounds at the bedside; Lantus insulin previously reduced to 20 units    Brett [name]

## 2020-04-10 NOTE — Care Management Important Message (Signed)
Important Message  Patient Details  Name: Brett [name] MRN: [id number] Date of Birth: Jan 29, 1935   Medicare Important Message Given:  Yes     Juliann Pulse A Dayane Hillenburg 04/10/2020, 11:21 AM

## 2020-04-11 ENCOUNTER — Ambulatory Visit: Payer: Medicare PPO | Admitting: Family

## 2020-04-11 ENCOUNTER — Inpatient Hospital Stay: Payer: Medicare PPO

## 2020-04-11 LAB — CBC
HCT: 26 % — ABNORMAL LOW (ref 39.0–52.0)
Hemoglobin: 8.4 g/dL — ABNORMAL LOW (ref 13.0–17.0)
MCH: 27.7 pg (ref 26.0–34.0)
MCHC: 32.3 g/dL (ref 30.0–36.0)
MCV: 85.8 fL (ref 80.0–100.0)
Platelets: 131 10*3/uL — ABNORMAL LOW (ref 150–400)
RBC: 3.03 MIL/uL — ABNORMAL LOW (ref 4.22–5.81)
RDW: 15.6 % — ABNORMAL HIGH (ref 11.5–15.5)
WBC: 8.8 10*3/uL (ref 4.0–10.5)
nRBC: 0 % (ref 0.0–0.2)

## 2020-04-11 LAB — BASIC METABOLIC PANEL
Anion gap: 9 (ref 5–15)
BUN: 66 mg/dL — ABNORMAL HIGH (ref 8–23)
CO2: 27 mmol/L (ref 22–32)
Calcium: 7.4 mg/dL — ABNORMAL LOW (ref 8.9–10.3)
Chloride: 107 mmol/L (ref 98–111)
Creatinine, Ser: 2.83 mg/dL — ABNORMAL HIGH (ref 0.61–1.24)
GFR calc Af Amer: 23 mL/min — ABNORMAL LOW (ref >60)
GFR calc non Af Amer: 20 mL/min — ABNORMAL LOW (ref >60)
Glucose, Bld: 141 mg/dL — ABNORMAL HIGH (ref 70–99)
Potassium: 4.5 mmol/L (ref 3.5–5.1)
Sodium: 143 mmol/L (ref 135–145)

## 2020-04-11 LAB — MAGNESIUM: Magnesium: 2 mg/dL (ref 1.7–2.4)

## 2020-04-11 LAB — GLUCOSE, CAPILLARY
Glucose-Capillary: 125 mg/dL — ABNORMAL HIGH (ref 70–99)
Glucose-Capillary: 198 mg/dL — ABNORMAL HIGH (ref 70–99)
Glucose-Capillary: 152 mg/dL — ABNORMAL HIGH (ref 70–99)
Glucose-Capillary: 187 mg/dL — ABNORMAL HIGH (ref 70–99)

## 2020-04-11 LAB — IRON AND TIBC
Iron: 11 ug/dL — ABNORMAL LOW (ref 45–182)
Saturation Ratios: 5 % — ABNORMAL LOW (ref 17.9–39.5)
TIBC: 221 ug/dL — ABNORMAL LOW (ref 250–450)
UIBC: 210 ug/dL

## 2020-04-11 LAB — FOLATE: Folate: 4.8 ng/mL — ABNORMAL LOW (ref >5.9)

## 2020-04-11 LAB — VITAMIN D 25 HYDROXY (VIT D DEFICIENCY, FRACTURES): Vit D, 25-Hydroxy: 35.92 ng/mL (ref 30–100)

## 2020-04-11 LAB — PHOSPHORUS: Phosphorus: 4.3 mg/dL (ref 2.5–4.6)

## 2020-04-11 LAB — VITAMIN B12: Vitamin B-12: 201 pg/mL (ref 180–914)

## 2020-04-11 MED ORDER — TAMSULOSIN HCL 0.4 MG PO CAPS
0.4000 mg | ORAL_CAPSULE | Freq: Every day | ORAL | Status: DC
  Administered 2020-04-11 – 2020-04-15 (×5): 0.4 mg via ORAL
  Filled 2020-04-11 (×5): qty 1

## 2020-04-11 MED ORDER — SODIUM CHLORIDE 0.9 % IV SOLN
200.0000 mg | INTRAVENOUS | Status: DC
  Administered 2020-04-11 – 2020-04-14 (×4): 200 mg via INTRAVENOUS
  Filled 2020-04-11 (×5): qty 10

## 2020-04-11 MED ORDER — FOLIC ACID 1 MG PO TABS
1.0000 mg | ORAL_TABLET | Freq: Every day | ORAL | Status: DC
  Administered 2020-04-11 – 2020-04-15 (×5): 1 mg via ORAL
  Filled 2020-04-11 (×5): qty 1

## 2020-04-11 NOTE — Progress Notes (Signed)
Palliative: Conference with transition of care team related to patient condition, needs, goals of care, disposition. [place] form completed.  Plan:  Home with home health when stable.  Out patient palliative services to follow.  Re hospitalize as needed at this point.   No charge  Quinn Axe, NP Palliative Medicine Team Team Phone # [phone] Greater than 50% of this time was spent counseling and coordinating care related to the above assessment and plan.

## 2020-04-11 NOTE — Progress Notes (Addendum)
PROGRESS NOTE    Brett [name]  ZES:[id number] DOB: 24-Nov-1935 DOA: 04/04/2020 PCP: Perrin Maltese, MD   Brief Narrative:  Brett [name] is a 85 y.o. male with medical history significant of hypertension, hyperlipidemia, diabetes mellitus, COPD on 2 L oxygen, stroke, GERD, PVD, PAD, iron deficiency anemia, dCHF, CAD, mitral valve regurgitation, aortic valve regurgitation, B-cell lymphoma (s/p of chemotherapy), atrial fibrillation not on anticoagulants, who presents with shortness breath.  He was found to be hypoxic at 72%, initially treated with BiPAP overnight, now saturating well on 4 L. He was also given IV Lasix for a possible CHF exacerbation, resulted in AKI.   Subjective: Patient has no new complaints .  Very hard of hearing, only hears from left ear. Currently patient is on 4 L oxygen via nasal cannula Chest x-ray was done yesterday showed worsening of pleural effusion so patient agreed for thoracentesis today.  Unable to diurese secondary to on CKD III/IV    Assessment & Plan:   Principal Problem:   Acute on chronic respiratory failure with hypoxemia (HCC) Active Problems:   HTN (hypertension)   CAD (coronary artery disease)   Stroke (HCC)   CAP (community acquired pneumonia)   COPD exacerbation (HCC)   Atrial fibrillation, chronic (HCC)   CHF, acute on chronic (HCC)   CKD (chronic kidney disease), stage IIIb   Type II diabetes mellitus with renal manifestations (HCC)   HLD (hyperlipidemia)   Acute on chronic diastolic CHF (congestive heart failure) (HCC)   Iron deficiency anemia   AKI (acute kidney injury) ([place])   Goals of care, counseling/discussion   Palliative care by specialist  Acute on chronic respiratory failure with hypoxemia (HCC)/COPD exacerbation and possible CAP.  Respiratory status seems improving, now saturating well on 4 L, still up from his baseline of 2 L.  There was no wheezing today.  Strep pneumococcal antigen was negative.  Blood cultures  negative.  Legionella negative.PCT 0.31>>0.24 -Continue with bronchodilators. -s/p Ceftriaxone for 5 days and  and azithromycin x 3 days  -Completed the course of prednisone.   Acute on chronic diastolic CHF: 2D echo on 26/33/3545 showed EF of 50-55%. He was placed on Lasix 40 mg IV twice daily.   creatinine 1.8 =>bumped to 2.57=>2.93  BNP mildly elevated but patient also has CKD. -Repeat echocardiogram done-.  EF of 50 to 55% with grade 2 diastolic dysfunction -Continue with daily weight and strict intake and output. - REDs Vest reading of 37.echocardiogram with more than 50% variability IVC 5/13 CXR shows pulmonary edema and pleural effusion 5/13 started IV Lasix infusion 40 mill per hour for overnight, creatinine got worse so discontinued IV Lasix infusion today    Pleural effusion Follow IR for thoracentesis and follow fluid studies    Hypertension.  BP within goal. -Continue home medications which include amlodipine, hydralazine, metoprolol.  Hx of CAD (coronary artery disease): no CP. Trop 16b -->16. -Continue Plavix, Crestor, Imure  Hx of Stroke [facility]): -plavix and crestor  Atrial fibrillation, chronic ([place]): Not on anticoagulants at home. -continue metoprolol  AKI with CKD (chronic kidney disease), stage IIIb.  most likely prerenal secondary to diuresis.  Creatinine around 1.7-1.8.  appears dry-gentle hydration with IV fluid. -Nephrology was consulted as renal function continued to get worsen despite with gentle hydration-they are recommending continuation of gentle hydration. -Suspecting cardiorenal syndrome. -Continue to monitor -Avoid nephrotoxins.  -Palliative care consult. Creatinine 2.6 on 5/13 5/13 RN was advised to do bladder scan to rule out urine retention  Type II diabetes mellitus with renal manifestations ([place]): Most recent A1c 12.4, poorly controled. Patient is taking Amaryl at home. CBG elevated as patient is on steroid. - 5/13 Decreased  Lantus 20 units qhs (developed Hypoglycemia on 12U BID) -SSI Decreased dose of Lantus at night if patient remains hypoglycemic   HLD (hyperlipidemia) -Crestor  Iron deficiency anemia: Hgb 9.8 on 12/05/19 -->9.5 >>8.0>>7.2>>7.8 -f/u by CBC -5/14 started Venofer 20 mg IV daily for 5 days, start oral supplement with vitamin C on discharge -Transfuse if below 7. Folate deficiency, started oral supplement. Follow vitamin B12 and vitamin D level   Objective: Vitals:   04/10/20 2356 04/11/20 0500 04/11/20 0754 04/11/20 1042  BP: 123/60  (!) 122/56 (!) 131/56  Pulse: 90  87 93  Resp: 18  16   Temp: 98.2 F (36.8 C)  98.6 F (37 C)   TempSrc:   Oral   SpO2: 93%  98% 100%  Weight:  85.2 kg    Height:        Intake/Output Summary (Last 24 hours) at 04/11/2020 1138 Last data filed at 04/11/2020 1021 Gross per 24 hour  Intake 805.31 ml  Output 2300 ml  Net -1494.69 ml   Filed Weights   04/07/20 0422 04/08/20 0608 04/11/20 0500  Weight: 86.7 kg 87.5 kg 85.2 kg    Examination:  General exam: Appears calm and comfortable  Respiratory system: Clear to auscultation. Respiratory effort normal. Cardiovascular system: S1 & S2 heard, RRR. No JVD, murmurs, rubs, gallops or clicks. Gastrointestinal system: Soft, nontender, nondistended, bowel sounds positive. Central nervous system: Alert and oriented. No focal neurological deficits. Extremities: Trace LE edema, no cyanosis, pulses intact and symmetrical. Psychiatry: Judgement and insight appear normal.     DVT prophylaxis: Lovenox Code Status: DNR Family Communication: POA Claudette Head was updated on phone.  He lives with patient. Disposition Plan:  Status is: Inpatient  Remains inpatient appropriate because:Inpatient level of care appropriate due to severity of illness   Dispo: The patient is from: Home              Anticipated d/c is to: Home              Anticipated d/c date is: 2-3 days              Patient currently is  not medically stable to d/c.   Patient has used his rehab days earlier this year. Will need home health on discharge.  Suspecting cardiorenal syndrome.  Palliative care was consulted.  5/14 Still creatinine is elevated s/p IV Lasix infusion, unable to diurese.  CXR shows pleural effusion, patient needs thoracentesis, IR consulted Will follow nephrology for disposition plan in 1 to 2 days    Consultants:   Nephrology  Procedures:  Antimicrobials:  Rocephin x 5 days Azithromycin x 3 days  Data Reviewed: I have personally reviewed following labs and imaging studies  CBC: Recent Labs  Lab 04/07/20 0438 04/08/20 0448 04/09/20 0444 04/10/20 0418 04/11/20 0504  WBC 11.8* 8.3 8.4 10.0 8.8  HGB 7.2* 7.8* 7.8* 8.1* 8.4*  HCT 23.3* 24.0* 24.3* 25.4* 26.0*  MCV 88.3 86.0 86.8 86.7 85.8  PLT 167 160 154 143* 505*   Basic Metabolic Panel: Recent Labs  Lab 04/07/20 0438 04/08/20 0448 04/09/20 0444 04/10/20 0418 04/11/20 0504  NA 142 140 142 146* 143  K 4.3 4.4 4.4 4.5 4.5  CL 103 104 109 113* 107  CO2 28 27 26 26 27   GLUCOSE 230* 283*  122* 68* 141*  BUN 85* 84* 78* 72* 66*  CREATININE 3.33* 3.12* 2.93* 2.60* 2.83*  CALCIUM 7.1* 7.0* 7.1* 7.1* 7.4*  MG  --   --   --  1.9 2.0  PHOS  --   --   --  4.0 4.3   GFR: Estimated Creatinine Clearance: 21.4 mL/min (A) (by C-G formula based on SCr of 2.83 mg/dL (H)). Liver Function Tests: Recent Labs  Lab 04/10/20 0418  ALBUMIN 2.4*   No results for input(s): LIPASE, AMYLASE in the last 168 hours. No results for input(s): AMMONIA in the last 168 hours. Coagulation Profile: No results for input(s): INR, PROTIME in the last 168 hours. Cardiac Enzymes: No results for input(s): CKTOTAL, CKMB, CKMBINDEX, TROPONINI in the last 168 hours. BNP (last 3 results) No results for input(s): PROBNP in the last 8760 hours. HbA1C: No results for input(s): HGBA1C in the last 72 hours. CBG: Recent Labs  Lab 04/10/20 1129 04/10/20 1704  04/10/20 2207 04/11/20 0757 04/11/20 1127  GLUCAP 114* 164* 240* 125* 198*   Lipid Profile: No results for input(s): CHOL, HDL, LDLCALC, TRIG, CHOLHDL, LDLDIRECT in the last 72 hours. Thyroid Function Tests: No results for input(s): TSH, T4TOTAL, FREET4, T3FREE, THYROIDAB in the last 72 hours. Anemia Panel: Recent Labs    04/11/20 0504  FOLATE 4.8*  TIBC 221*  IRON 11*   Sepsis Labs: Recent Labs  Lab 04/05/20 0433 04/06/20 0447  PROCALCITON 0.31 0.24    Recent Results (from the past 240 hour(s))  Blood culture (routine x 2)     Status: None   Collection Time: 04/04/20  4:47 AM   Specimen: Right Antecubital; Blood  Result Value Ref Range Status   Specimen Description RIGHT ANTECUBITAL  Final   Special Requests   Final    BOTTLES DRAWN AEROBIC AND ANAEROBIC Blood Culture adequate volume   Culture   Final    NO GROWTH 5 DAYS Performed at [facility], [address], [place], [place] [address]    Report Status 04/09/2020 FINAL  Final  Respiratory Panel by RT PCR (Flu A&B, Covid) - Nasopharyngeal Swab     Status: None   Collection Time: 04/04/20  4:48 AM   Specimen: Nasopharyngeal Swab  Result Value Ref Range Status   SARS Coronavirus 2 by RT PCR NEGATIVE NEGATIVE Final    Comment: (NOTE) SARS-CoV-2 target nucleic acids are NOT DETECTED. The SARS-CoV-2 RNA is generally detectable in upper respiratoy specimens during the acute phase of infection. The lowest concentration of SARS-CoV-2 viral copies this assay can detect is 131 copies/mL. A negative result does not preclude SARS-Cov-2 infection and should not be used as the sole basis for treatment or other patient management decisions. A negative result may occur with  improper specimen collection/handling, submission of specimen other than nasopharyngeal swab, presence of viral mutation(s) within the areas targeted by this assay, and inadequate number of viral copies (<131 copies/mL). A negative result must  be combined with clinical observations, patient history, and epidemiological information. The expected result is Negative. Fact Sheet for Patients:  PinkCheek.be Fact Sheet for Healthcare Providers:  GravelBags.it This test is not yet ap proved or cleared by the [place] FDA and  has been authorized for detection and/or diagnosis of SARS-CoV-2 by FDA under an Emergency Use Authorization (EUA). This EUA will remain  in effect (meaning this test can be used) for the duration of the COVID-19 declaration under Section 564(b)(1) of the Act, 21 U.S.C. section 360bbb-3(b)(1), unless  the authorization is terminated or revoked sooner.    Influenza A by PCR NEGATIVE NEGATIVE Final   Influenza B by PCR NEGATIVE NEGATIVE Final    Comment: (NOTE) The Xpert Xpress SARS-CoV-2/FLU/RSV assay is intended as an aid in  the diagnosis of influenza from Nasopharyngeal swab specimens and  should not be used as a sole basis for treatment. Nasal washings and  aspirates are unacceptable for Xpert Xpress SARS-CoV-2/FLU/RSV  testing. Fact Sheet for Patients: PinkCheek.be Fact Sheet for Healthcare Providers: GravelBags.it This test is not yet approved or cleared by the [place] FDA and  has been authorized for detection and/or diagnosis of SARS-CoV-2 by  FDA under an Emergency Use Authorization (EUA). This EUA will remain  in effect (meaning this test can be used) for the duration of the  Covid-19 declaration under Section 564(b)(1) of the Act, 21  U.S.C. section 360bbb-3(b)(1), unless the authorization is  terminated or revoked. Performed at [facility], [address] [place], [place] [address]   Blood culture (routine x 2)     Status: None   Collection Time: 04/04/20  4:52 AM   Specimen: BLOOD LEFT WRIST  Result Value Ref Range Status   Specimen Description BLOOD LEFT  WRIST  Final   Special Requests   Final    BOTTLES DRAWN AEROBIC AND ANAEROBIC Blood Culture adequate volume   Culture   Final    NO GROWTH 5 DAYS Performed at [facility], [address], [place], [place] [address]    Report Status 04/09/2020 FINAL  Final  Urine culture     Status: None   Collection Time: 04/04/20  6:28 AM   Specimen: Urine, Random  Result Value Ref Range Status   Specimen Description   Final    URINE, RANDOM Performed at [facility], [address] [place], [place] [address]    Special Requests   Final    NONE Performed at [facility], [address], [place], [place] [address]    Culture   Final    NO GROWTH Performed at [facility], [place] [address] [place], [place] [address]    Report Status 04/05/2020 FINAL  Final     Radiology Studies: DG Chest Port 1 View  Result Date: 04/10/2020 CLINICAL DATA:  Dyspnea EXAM: PORTABLE CHEST 1 VIEW COMPARISON:  Radiograph 04/04/2020, CT 01/21/2019 FINDINGS: Diffuse interstitial opacities throughout the lungs with more confluent opacity in the right mid to lower lung which is increased from comparison exams. There is indistinctness of the pulmonary vascularity as well as fissural thickening with bilateral pleural effusions. No pneumothorax. Cardiomegaly is similar to prior likely accentuated by abundant mediastinal fat seen on comparison CT. The aorta is calcified. The remaining cardiomediastinal contours are unremarkable. No acute osseous or soft tissue abnormality. Degenerative changes are present in the imaged spine and shoulders. Telemetry leads overlie the chest. Surgical clip at the base of the left neck. IMPRESSION: 1. Findings suggesting CHF with edema and bilateral effusions. More confluent opacity in the right mid to lower lung could reflect alveolar edema or pleural fluid though dense consolidation of infection could have a similar appearance. 2. Stable cardiomegaly.  Electronically Signed   By: Lovena Le M.D.   On: 04/10/2020 15:02    Scheduled Meds: . amLODipine  5 mg Oral Daily  . clopidogrel  75 mg Oral Daily  . enoxaparin (LOVENOX) injection  30 mg Subcutaneous Q24H  . ferrous sulfate  325 mg Oral BID  WC  . gabapentin  100 mg Oral BID  . hydrALAZINE  100 mg Oral BID  . insulin aspart  0-15 Units Subcutaneous TID WC  . insulin aspart  0-5 Units Subcutaneous QHS  . insulin glargine  20 Units Subcutaneous QHS  . ipratropium-albuterol  3 mL Nebulization TID  . isosorbide mononitrate  30 mg Oral Daily  . metoprolol  200 mg Oral Daily  . pantoprazole  40 mg Oral Daily  . polyethylene glycol  17 g Oral Daily  . rosuvastatin  10 mg Oral Daily  . sodium chloride flush  3 mL Intravenous Q12H   Continuous Infusions: . sodium chloride       LOS: 7 days   Time spent: 40 minutes.  Val Riles, MD [facility]  If 7PM-7AM, please contact night-coverage [email or web address]  04/11/2020, 11:38 AM   This record has been created using Systems analyst. Errors have been sought and corrected,but may not always be located. Such creation errors do not reflect on the standard of care.

## 2020-04-12 DIAGNOSIS — N1832 Chronic kidney disease, stage 3b: Secondary | ICD-10-CM

## 2020-04-12 DIAGNOSIS — E1121 Type 2 diabetes mellitus with diabetic nephropathy: Secondary | ICD-10-CM

## 2020-04-12 DIAGNOSIS — D509 Iron deficiency anemia, unspecified: Secondary | ICD-10-CM

## 2020-04-12 DIAGNOSIS — J9601 Acute respiratory failure with hypoxia: Secondary | ICD-10-CM

## 2020-04-12 DIAGNOSIS — J810 Acute pulmonary edema: Secondary | ICD-10-CM

## 2020-04-12 DIAGNOSIS — Z794 Long term (current) use of insulin: Secondary | ICD-10-CM

## 2020-04-12 LAB — BASIC METABOLIC PANEL
Anion gap: 10 (ref 5–15)
BUN: 63 mg/dL — ABNORMAL HIGH (ref 8–23)
CO2: 27 mmol/L (ref 22–32)
Calcium: 7.2 mg/dL — ABNORMAL LOW (ref 8.9–10.3)
Chloride: 103 mmol/L (ref 98–111)
Creatinine, Ser: 2.72 mg/dL — ABNORMAL HIGH (ref 0.61–1.24)
GFR calc Af Amer: 24 mL/min — ABNORMAL LOW (ref >60)
GFR calc non Af Amer: 21 mL/min — ABNORMAL LOW (ref >60)
Glucose, Bld: 102 mg/dL — ABNORMAL HIGH (ref 70–99)
Potassium: 4.3 mmol/L (ref 3.5–5.1)
Sodium: 140 mmol/L (ref 135–145)

## 2020-04-12 LAB — MAGNESIUM: Magnesium: 2 mg/dL (ref 1.7–2.4)

## 2020-04-12 LAB — CBC
HCT: 23.4 % — ABNORMAL LOW (ref 39.0–52.0)
Hemoglobin: 7.6 g/dL — ABNORMAL LOW (ref 13.0–17.0)
MCH: 27.7 pg (ref 26.0–34.0)
MCHC: 32.5 g/dL (ref 30.0–36.0)
MCV: 85.4 fL (ref 80.0–100.0)
Platelets: 146 10*3/uL — ABNORMAL LOW (ref 150–400)
RBC: 2.74 MIL/uL — ABNORMAL LOW (ref 4.22–5.81)
RDW: 15.6 % — ABNORMAL HIGH (ref 11.5–15.5)
WBC: 7.6 10*3/uL (ref 4.0–10.5)
nRBC: 0 % (ref 0.0–0.2)

## 2020-04-12 LAB — GLUCOSE, CAPILLARY
Glucose-Capillary: 91 mg/dL (ref 70–99)
Glucose-Capillary: 151 mg/dL — ABNORMAL HIGH (ref 70–99)
Glucose-Capillary: 178 mg/dL — ABNORMAL HIGH (ref 70–99)
Glucose-Capillary: 171 mg/dL — ABNORMAL HIGH (ref 70–99)

## 2020-04-12 LAB — PHOSPHORUS: Phosphorus: 5.4 mg/dL — ABNORMAL HIGH (ref 2.5–4.6)

## 2020-04-12 NOTE — Progress Notes (Signed)
Urine output of 500 at 2134. Patient bladder scanned at 0000 and the volume was 445. At [place] patient had output of 250 then at 0134 he had in and out cath.Got out 350 mL. Patient bladder scanned at 0518 and had 337. NP notified.

## 2020-04-12 NOTE — Progress Notes (Signed)
[facility]  ROUNDING NOTE   Subjective:   Patient has no complaints.  Creatinine 2.73 (2.83)  Objective:  Vital signs in last 24 hours:  Temp:  [97.9 F (36.6 C)-99.1 F (37.3 C)] 98 F (36.7 C) (05/15 0819) Pulse Rate:  [79-91] 84 (05/15 0819) Resp:  [17] 17 (05/14 1615) BP: (101-114)/(48-57) 114/57 (05/15 0819) SpO2:  [93 %-95 %] 95 % (05/15 0819) Weight:  [83.4 kg] 83.4 kg (05/15 0500)  Weight change: -1.8 kg Filed Weights   04/08/20 0608 04/11/20 0500 04/12/20 0500  Weight: 87.5 kg 85.2 kg 83.4 kg    Intake/Output: I/O last 3 completed shifts: In: 380.7 [P.O.:240; I.V.:33.1; IV Piggyback:107.6] Out: 3300 [Urine:3300]   Intake/Output this shift:  Total I/O In: 3 [I.V.:3] Out: 200 [Urine:200]  Physical Exam: General: NAD, laying in bed  Head: +hard of hearing  Eyes: Anicteric, PERRL  Neck: Supple, trachea midline  Lungs:  Clear to auscultation  Heart: Regular rate and rhythm  Abdomen:  Soft, nontender,   Extremities:  no peripheral edema.  Neurologic: Nonfocal, moving all four extremities  Skin: No lesions       Basic Metabolic Panel: Recent Labs  Lab 04/08/20 0448 04/08/20 0448 04/09/20 0444 04/09/20 0444 04/10/20 0418 04/11/20 0504 04/12/20 0510  NA 140  --  142  --  146* 143 140  K 4.4  --  4.4  --  4.5 4.5 4.3  CL 104  --  109  --  113* 107 103  CO2 27  --  26  --  26 27 27   GLUCOSE 283*  --  122*  --  68* 141* 102*  BUN 84*  --  78*  --  72* 66* 63*  CREATININE 3.12*  --  2.93*  --  2.60* 2.83* 2.72*  CALCIUM 7.0*   < > 7.1*   < > 7.1* 7.4* 7.2*  MG  --   --   --   --  1.9 2.0 2.0  PHOS  --   --   --   --  4.0 4.3 5.4*   < > = values in this interval not displayed.    Liver Function Tests: Recent Labs  Lab 04/10/20 0418  ALBUMIN 2.4*   No results for input(s): LIPASE, AMYLASE in the last 168 hours. No results for input(s): AMMONIA in the last 168 hours.  CBC: Recent Labs  Lab 04/08/20 0448 04/09/20 0444  04/10/20 0418 04/11/20 0504 04/12/20 0510  WBC 8.3 8.4 10.0 8.8 7.6  HGB 7.8* 7.8* 8.1* 8.4* 7.6*  HCT 24.0* 24.3* 25.4* 26.0* 23.4*  MCV 86.0 86.8 86.7 85.8 85.4  PLT 160 154 143* 131* 146*    Cardiac Enzymes: No results for input(s): CKTOTAL, CKMB, CKMBINDEX, TROPONINI in the last 168 hours.  BNP: Invalid input(s): POCBNP  CBG: Recent Labs  Lab 04/11/20 1127 04/11/20 1617 04/11/20 2100 04/12/20 0815 04/12/20 1137  GLUCAP 198* 152* 187* 20 151*    Microbiology: Results for orders placed or performed during the hospital encounter of 04/04/20  Blood culture (routine x 2)     Status: None   Collection Time: 04/04/20  4:47 AM   Specimen: Right Antecubital; Blood  Result Value Ref Range Status   Specimen Description RIGHT ANTECUBITAL  Final   Special Requests   Final    BOTTLES DRAWN AEROBIC AND ANAEROBIC Blood Culture adequate volume   Culture   Final    NO GROWTH 5 DAYS Performed at [facility], [place]  [address] [place], [place] [address]    Report Status 04/09/2020 FINAL  Final  Respiratory Panel by RT PCR (Flu A&B, Covid) - Nasopharyngeal Swab     Status: None   Collection Time: 04/04/20  4:48 AM   Specimen: Nasopharyngeal Swab  Result Value Ref Range Status   SARS Coronavirus 2 by RT PCR NEGATIVE NEGATIVE Final    Comment: (NOTE) SARS-CoV-2 target nucleic acids are NOT DETECTED. The SARS-CoV-2 RNA is generally detectable in upper respiratoy specimens during the acute phase of infection. The lowest concentration of SARS-CoV-2 viral copies this assay can detect is 131 copies/mL. A negative result does not preclude SARS-Cov-2 infection and should not be used as the sole basis for treatment or other patient management decisions. A negative result may occur with  improper specimen collection/handling, submission of specimen other than nasopharyngeal swab, presence of viral mutation(s) within the areas targeted by this assay, and inadequate number of  viral copies (<131 copies/mL). A negative result must be combined with clinical observations, patient history, and epidemiological information. The expected result is Negative. Fact Sheet for Patients:  PinkCheek.be Fact Sheet for Healthcare Providers:  GravelBags.it This test is not yet ap proved or cleared by the [place] FDA and  has been authorized for detection and/or diagnosis of SARS-CoV-2 by FDA under an Emergency Use Authorization (EUA). This EUA will remain  in effect (meaning this test can be used) for the duration of the COVID-19 declaration under Section 564(b)(1) of the Act, 21 U.S.C. section 360bbb-3(b)(1), unless the authorization is terminated or revoked sooner.    Influenza A by PCR NEGATIVE NEGATIVE Final   Influenza B by PCR NEGATIVE NEGATIVE Final    Comment: (NOTE) The Xpert Xpress SARS-CoV-2/FLU/RSV assay is intended as an aid in  the diagnosis of influenza from Nasopharyngeal swab specimens and  should not be used as a sole basis for treatment. Nasal washings and  aspirates are unacceptable for Xpert Xpress SARS-CoV-2/FLU/RSV  testing. Fact Sheet for Patients: PinkCheek.be Fact Sheet for Healthcare Providers: GravelBags.it This test is not yet approved or cleared by the [place] FDA and  has been authorized for detection and/or diagnosis of SARS-CoV-2 by  FDA under an Emergency Use Authorization (EUA). This EUA will remain  in effect (meaning this test can be used) for the duration of the  Covid-19 declaration under Section 564(b)(1) of the Act, 21  U.S.C. section 360bbb-3(b)(1), unless the authorization is  terminated or revoked. Performed at [facility], [address] [place], [place] [address]   Blood culture (routine x 2)     Status: None   Collection Time: 04/04/20  4:52 AM   Specimen: BLOOD LEFT WRIST  Result  Value Ref Range Status   Specimen Description BLOOD LEFT WRIST  Final   Special Requests   Final    BOTTLES DRAWN AEROBIC AND ANAEROBIC Blood Culture adequate volume   Culture   Final    NO GROWTH 5 DAYS Performed at [facility], [address] [place], [place] [address]    Report Status 04/09/2020 FINAL  Final  Urine culture     Status: None   Collection Time: 04/04/20  6:28 AM   Specimen: Urine, Random  Result Value Ref Range Status   Specimen Description   Final    URINE, RANDOM Performed at [facility], [address], [place], [place] [address]    Special Requests   Final    NONE Performed at [facility], [place]  [address] [place], [place] [address]    Culture   Final    NO GROWTH Performed at [facility], [place] [address] [place], [place] [address]    Report Status 04/05/2020 FINAL  Final    Coagulation Studies: No results for input(s): LABPROT, INR in the last 72 hours.  Urinalysis: No results for input(s): COLORURINE, LABSPEC, PHURINE, GLUCOSEU, HGBUR, BILIRUBINUR, KETONESUR, PROTEINUR, UROBILINOGEN, NITRITE, LEUKOCYTESUR in the last 72 hours.  Invalid input(s): APPERANCEUR    Imaging: [place] CHEST (PLEURAL EFFUSION)  Result Date: 04/11/2020 CLINICAL DATA:  Evaluate for pleural effusions and thoracentesis. EXAM: CHEST ULTRASOUND COMPARISON:  Chest radiograph 04/10/2020 FINDINGS: Both sides of the chest were evaluated with ultrasound. Trace pleural fluid on both sides of the chest. IMPRESSION: Trace bilateral pleural effusions. Thoracentesis not performed due to the small pleural fluid volumes. Electronically Signed   By: Markus Daft M.D.   On: 04/11/2020 12:51     Medications:   . sodium chloride    . iron sucrose 200 mg (04/12/20 1523)   . amLODipine  5 mg Oral Daily  . clopidogrel  75 mg Oral Daily  . enoxaparin (LOVENOX) injection  30 mg Subcutaneous Q24H  . folic acid  1 mg Oral Daily  . gabapentin  100 mg Oral BID  .  hydrALAZINE  100 mg Oral BID  . insulin aspart  0-15 Units Subcutaneous TID WC  . insulin aspart  0-5 Units Subcutaneous QHS  . insulin glargine  20 Units Subcutaneous QHS  . ipratropium-albuterol  3 mL Nebulization TID  . isosorbide mononitrate  30 mg Oral Daily  . metoprolol  200 mg Oral Daily  . pantoprazole  40 mg Oral Daily  . polyethylene glycol  17 g Oral Daily  . rosuvastatin  10 mg Oral Daily  . sodium chloride flush  3 mL Intravenous Q12H  . tamsulosin  0.4 mg Oral QPC supper   sodium chloride, acetaminophen, albuterol, dextromethorphan-guaiFENesin, hydrALAZINE, ondansetron (ZOFRAN) IV, sodium chloride flush  Assessment/ Plan:  [name] is a 85 y.o. white male with B-cell lymphoma, aortic regurgitation, CAD, CHF, diabetes mellitus type 2, COPD, hypertension, iron deficiency anemia, peptic ulcer disease, peripheral vascular disease who was admitted to [facility] on 04/04/2020 for Acute pulmonary edema ([place]) [J81.0] Acute respiratory failure with hypoxia ([place]) [J96.01] CHF, acute on chronic (HCC) [I50.9] Acute on chronic diastolic CHF (congestive heart failure) ([place]) [I50.33] Community acquired pneumonia, unspecified laterality [J18.9]  1. Acute renal failure on chronic kidney disease stage IIIB: baseline creatinine of 1.79, GFR of 35 on 12/05/19.  Chronic Kidney disease secondary to diabetes Acute renal failure secondary to ATN Creatinine is improving.   2. Hypertension: 114/57. Current regimen of tamsulosin, metoprolol, isosorbide mononitrate, hydralazine and amlodipine.  Holding furosemide.   3. Anemia with chronic kidney disease: normocytic. Hemoglobin 7.6.  Not currently a candidate for ESA with malignancy.    LOS: 8 Xzavien Harada 5/15/20213:35 PM

## 2020-04-12 NOTE — Progress Notes (Signed)
[facility]  PROGRESS NOTE  CAMERYN CHRISLEY BSW:[id number] DOB: 03-Mar-1935 DOA: 04/04/2020 PCP: Perrin Maltese, MD Admit date - 04/04/2020   Admitting Physician Ivor Costa, MD  Outpatient Primary MD for the patient is Perrin Maltese, MD  LOS - 8 Brief Narrative   ISON WICHMANN is a 85 y.o. year old male with medical history significant for  B-cell lymphoma, aortic regurgitation, CAD, CHF, diabetes mellitus type 2, COPD on 2 L, hypertension, iron deficiency anemia, peptic ulcer disease, peripheral vascular disease who presented on 04/04/2020 with shortness of breath, lower extremity edema, worsening hypoxia and was found to have acute on chronic hypoxic respiratory failure secondary to pulmonary edema related to CHF exacerbation and concern for pneumonia. In work-up Legionella, strep pneumo, blood cultures, Covid testing unremarkable. BNP elevated at 591 on admission with signs of pulmonary edema on chest x-ray consistent with CHF, some concern for potential infection with right airspace patchy disease, procalcitonin was less than 0.10. Patient started on vancomycin, cefepime in ED as well as IV Solu-Medrol, duo nebs for COPD as well as IV Lasix and BiPAP for CHF exacerbation.   Hospital course complicated by AKI in setting of IV Lasix for CHF exacerbation  Subjective  Today denies any pain. No shortness of breath still has some cough  A & P   Acute on chronic diastolic CHF. Net -5 L this admission, still has crackles at bases, O2 requirements decreasing, no overt hypervolemia on exam, pleural effusions noted on chest x-ray but not large enough for therapeutic thoracentesis. EF preserved with 50 to 46% grade 2 diastolic dysfunction -Holding Lasix in the setting of AKI which is currently improving -Continue to monitor ins and outs -Daily weights  Acute on chronic hypoxic respiratory failure. Pulmonary edema with bilateral effusions on chest x-ray (5/13), effusion is too small to warrant  thoracentesis. Currently holding off on IV Lasix given AKI. There are some concern for possible CAP patient has completed 5-day course of ceftriaxone on 3-day course of azithromycin as well as a course of prednisone for potential COPD flare -Wean O2 as able  AKI on CKD stage III related to diabetes. Improving. AKI related to ATN, creatinine improving off Lasix. No hydronephrosis on renal ultrasound -Continue to monitor BMP -Nephrology following -Avoid nephrotoxins  Hypertension, at goal -Continue home regimen: Tamsulosin, metoprolol, Imdur, drowsy, amlodipine  Acute on chronic anemia of CKD. Iron panel consistent with iron deficiency anemia. Previous baseline hemoglobin of 9.8, around 7.2-7.8 currently. No current signs or symptoms of bleeding -Continue to monitor for IV daily for five total days, follow-up with vitamin C/oral supplementation on discharge -Transfuse of hemoglobin less than seven  History of CAD/CVA. Asymptomatic. -Continue Plavix, Crestor, Imdur  Type 2 diabetes -Lantus 20 units,, stable monitor CBGs, sliding scale as needed  COPD, no current wheezing, stable -Continue to wean O2 back to home regimen -DuoNebs 3 times daily  GERD, stable continue Protonix  Chronic atrial fibrillation, rate controlled. Not on anticoagulation -Continue metoprolol  Hyperlipidemia, stable continue statin  BPH-continue Flomax -Monitor bladder scans  Goals of care discussion. Palliative care consulted -Home with home health with stable, outpatient palliative services to follow         Family Communication  : None, will update Claudette Head per patient request  Code Status : DNR  Disposition Plan  :  Patient is from home. Anticipated d/c date: 2 to 3 days. Barriers to d/c or necessity for inpatient status:  Continued improvement in kidney function, continue  improvement in respiratory status, trending diuresis needs Consults  : Nephrology  Procedures  : TTE  DVT  Prophylaxis  :  Lovenox   Lab Results  Component Value Date   PLT 146 (L) 04/12/2020    Diet :  Diet Order            Diet 2 gram sodium Room service appropriate? Yes; Fluid consistency: Thin; Fluid restriction: 1500 mL Fluid  Diet effective now               Inpatient Medications Scheduled Meds: . amLODipine  5 mg Oral Daily  . clopidogrel  75 mg Oral Daily  . enoxaparin (LOVENOX) injection  30 mg Subcutaneous Q24H  . folic acid  1 mg Oral Daily  . gabapentin  100 mg Oral BID  . hydrALAZINE  100 mg Oral BID  . insulin aspart  0-15 Units Subcutaneous TID WC  . insulin aspart  0-5 Units Subcutaneous QHS  . insulin glargine  20 Units Subcutaneous QHS  . ipratropium-albuterol  3 mL Nebulization TID  . isosorbide mononitrate  30 mg Oral Daily  . metoprolol  200 mg Oral Daily  . pantoprazole  40 mg Oral Daily  . polyethylene glycol  17 g Oral Daily  . rosuvastatin  10 mg Oral Daily  . sodium chloride flush  3 mL Intravenous Q12H  . tamsulosin  0.4 mg Oral QPC supper   Continuous Infusions: . sodium chloride    . iron sucrose 200 mg (04/12/20 1523)   PRN Meds:.sodium chloride, acetaminophen, albuterol, dextromethorphan-guaiFENesin, hydrALAZINE, ondansetron (ZOFRAN) IV, sodium chloride flush  Antibiotics  :   Anti-infectives (From admission, onward)   Start     Dose/Rate Route Frequency Ordered Stop   04/05/20 1000  azithromycin (ZITHROMAX) 500 mg in sodium chloride 0.9 % 250 mL IVPB     500 mg 250 mL/hr over 60 Minutes Intravenous Every 24 hours 04/04/20 0844 04/06/20 1232   04/05/20 1000  cefTRIAXone (ROCEPHIN) 1 g in sodium chloride 0.9 % 100 mL IVPB     1 g 200 mL/hr over 30 Minutes Intravenous Every 24 hours 04/04/20 0844 04/08/20 0836   04/04/20 0645  vancomycin (VANCOCIN) IVPB 1000 mg/200 mL premix     1,000 mg 200 mL/hr over 60 Minutes Intravenous  Once 04/04/20 0639 04/04/20 0905   04/04/20 0600  vancomycin (VANCOCIN) IVPB 1000 mg/200 mL premix     1,000  mg 200 mL/hr over 60 Minutes Intravenous  Once 04/04/20 0556 04/04/20 0753   04/04/20 0600  ceFEPIme (MAXIPIME) 2 g in sodium chloride 0.9 % 100 mL IVPB     2 g 200 mL/hr over 30 Minutes Intravenous  Once 04/04/20 0556 04/04/20 0706   04/04/20 0600  azithromycin (ZITHROMAX) 500 mg in sodium chloride 0.9 % 250 mL IVPB     500 mg 250 mL/hr over 60 Minutes Intravenous  Once 04/04/20 0556 04/04/20 0753       Objective   Vitals:   04/11/20 2235 04/12/20 0500 04/12/20 0819 04/12/20 2007  BP: (!) 108/51  (!) 114/57   Pulse: 79  84   Resp:      Temp: 97.9 F (36.6 C)  98 F (36.7 C)   TempSrc: Oral  Oral   SpO2: 95%  95% 93%  Weight:  83.4 kg    Height:        SpO2: 93 % O2 Flow Rate (L/min): 2 L/min FiO2 (%): 40 %  Wt Readings from Last 3 Encounters:  04/12/20 83.4 kg  12/04/19 89.3 kg  07/14/19 90.7 kg     Intake/Output Summary (Last 24 hours) at 04/12/2020 2117 Last data filed at 04/12/2020 1858 Gross per 24 hour  Intake 123 ml  Output 1390 ml  Net -1267 ml    Physical Exam:     Awake Alert, Oriented X 3, Normal affect No new F.N deficits,  Farmer.AT, Normal respiratory effort on on 3 L, crackles at bases, left greater than right RRR,No Gallops,Rubs or new Murmurs,  +ve B.Sounds, Abd Soft, No tenderness, No rebound, guarding or rigidity. No Cyanosis, No new Rash or bruise     I have personally reviewed the following:   Data Reviewed:  CBC Recent Labs  Lab 04/08/20 0448 04/09/20 0444 04/10/20 0418 04/11/20 0504 04/12/20 0510  WBC 8.3 8.4 10.0 8.8 7.6  HGB 7.8* 7.8* 8.1* 8.4* 7.6*  HCT 24.0* 24.3* 25.4* 26.0* 23.4*  PLT 160 154 143* 131* 146*  MCV 86.0 86.8 86.7 85.8 85.4  MCH 28.0 27.9 27.6 27.7 27.7  MCHC 32.5 32.1 31.9 32.3 32.5  RDW 15.3 15.2 15.3 15.6* 15.6*    Chemistries  Recent Labs  Lab 04/08/20 0448 04/09/20 0444 04/10/20 0418 04/11/20 0504 04/12/20 0510  NA 140 142 146* 143 140  K 4.4 4.4 4.5 4.5 4.3  CL 104 109 113* 107 103   CO2 27 26 26 27 27   GLUCOSE 283* 122* 68* 141* 102*  BUN 84* 78* 72* 66* 63*  CREATININE 3.12* 2.93* 2.60* 2.83* 2.72*  CALCIUM 7.0* 7.1* 7.1* 7.4* 7.2*  MG  --   --  1.9 2.0 2.0   ------------------------------------------------------------------------------------------------------------------ No results for input(s): CHOL, HDL, LDLCALC, TRIG, CHOLHDL, LDLDIRECT in the last 72 hours.  Lab Results  Component Value Date   HGBA1C 12.4 (H) 11/27/2019   ------------------------------------------------------------------------------------------------------------------ No results for input(s): TSH, T4TOTAL, T3FREE, THYROIDAB in the last 72 hours.  Invalid input(s): FREET3 ------------------------------------------------------------------------------------------------------------------ Recent Labs    04/11/20 0504 04/11/20 0849  VITAMINB12  --  201  FOLATE 4.8*  --   TIBC 221*  --   IRON 11*  --     Coagulation profile No results for input(s): INR, PROTIME in the last 168 hours.  No results for input(s): DDIMER in the last 72 hours.  Cardiac Enzymes No results for input(s): CKMB, TROPONINI, MYOGLOBIN in the last 168 hours.  Invalid input(s): CK ------------------------------------------------------------------------------------------------------------------    Component Value Date/Time   BNP 301.0 (H) 04/10/2020 0418    Micro Results Recent Results (from the past 240 hour(s))  Blood culture (routine x 2)     Status: None   Collection Time: 04/04/20  4:47 AM   Specimen: Right Antecubital; Blood  Result Value Ref Range Status   Specimen Description RIGHT ANTECUBITAL  Final   Special Requests   Final    BOTTLES DRAWN AEROBIC AND ANAEROBIC Blood Culture adequate volume   Culture   Final    NO GROWTH 5 DAYS Performed at [facility], [address] [place], [place] [address]    Report Status 04/09/2020 FINAL  Final  Respiratory Panel by RT PCR (Flu A&B,  Covid) - Nasopharyngeal Swab     Status: None   Collection Time: 04/04/20  4:48 AM   Specimen: Nasopharyngeal Swab  Result Value Ref Range Status   SARS Coronavirus 2 by RT PCR NEGATIVE NEGATIVE Final    Comment: (NOTE) SARS-CoV-2 target nucleic acids are NOT DETECTED. The SARS-CoV-2 RNA is generally detectable in upper respiratoy specimens during  the acute phase of infection. The lowest concentration of SARS-CoV-2 viral copies this assay can detect is 131 copies/mL. A negative result does not preclude SARS-Cov-2 infection and should not be used as the sole basis for treatment or other patient management decisions. A negative result may occur with  improper specimen collection/handling, submission of specimen other than nasopharyngeal swab, presence of viral mutation(s) within the areas targeted by this assay, and inadequate number of viral copies (<131 copies/mL). A negative result must be combined with clinical observations, patient history, and epidemiological information. The expected result is Negative. Fact Sheet for Patients:  PinkCheek.be Fact Sheet for Healthcare Providers:  GravelBags.it This test is not yet ap proved or cleared by the [place] FDA and  has been authorized for detection and/or diagnosis of SARS-CoV-2 by FDA under an Emergency Use Authorization (EUA). This EUA will remain  in effect (meaning this test can be used) for the duration of the COVID-19 declaration under Section 564(b)(1) of the Act, 21 U.S.C. section 360bbb-3(b)(1), unless the authorization is terminated or revoked sooner.    Influenza A by PCR NEGATIVE NEGATIVE Final   Influenza B by PCR NEGATIVE NEGATIVE Final    Comment: (NOTE) The Xpert Xpress SARS-CoV-2/FLU/RSV assay is intended as an aid in  the diagnosis of influenza from Nasopharyngeal swab specimens and  should not be used as a sole basis for treatment. Nasal washings and   aspirates are unacceptable for Xpert Xpress SARS-CoV-2/FLU/RSV  testing. Fact Sheet for Patients: PinkCheek.be Fact Sheet for Healthcare Providers: GravelBags.it This test is not yet approved or cleared by the [place] FDA and  has been authorized for detection and/or diagnosis of SARS-CoV-2 by  FDA under an Emergency Use Authorization (EUA). This EUA will remain  in effect (meaning this test can be used) for the duration of the  Covid-19 declaration under Section 564(b)(1) of the Act, 21  U.S.C. section 360bbb-3(b)(1), unless the authorization is  terminated or revoked. Performed at [facility], [address] [place], [place] [address]   Blood culture (routine x 2)     Status: None   Collection Time: 04/04/20  4:52 AM   Specimen: BLOOD LEFT WRIST  Result Value Ref Range Status   Specimen Description BLOOD LEFT WRIST  Final   Special Requests   Final    BOTTLES DRAWN AEROBIC AND ANAEROBIC Blood Culture adequate volume   Culture   Final    NO GROWTH 5 DAYS Performed at [facility], [address], [place], [place] [address]    Report Status 04/09/2020 FINAL  Final  Urine culture     Status: None   Collection Time: 04/04/20  6:28 AM   Specimen: Urine, Random  Result Value Ref Range Status   Specimen Description   Final    URINE, RANDOM Performed at [facility], [address], [place], [place] [address]    Special Requests   Final    NONE Performed at [facility], [address], [place], [place] [address]    Culture   Final    NO GROWTH Performed at [facility], [place] [address], [place], [place] [address]    Report Status 04/05/2020 FINAL  Final    Radiology Reports [place] CHEST (PLEURAL EFFUSION)  Result Date: 04/11/2020 CLINICAL DATA:  Evaluate for pleural effusions and thoracentesis. EXAM: CHEST ULTRASOUND COMPARISON:  Chest radiograph 04/10/2020  FINDINGS: Both sides of the chest were evaluated with ultrasound. Trace pleural fluid on both sides  of the chest. IMPRESSION: Trace bilateral pleural effusions. Thoracentesis not performed due to the small pleural fluid volumes. Electronically Signed   By: Markus Daft M.D.   On: 04/11/2020 12:51   US RENAL  Result Date: 04/07/2020 CLINICAL DATA:  AKI EXAM: RENAL / URINARY TRACT ULTRASOUND COMPLETE COMPARISON:  None. FINDINGS: Right Kidney: Renal measurements: 10.2 x 5 x 4.5 cm = volume: 120.4 mL . Echogenicity within normal limits. Two small cysts. No solid mass or hydronephrosis visualized. Left Kidney: Renal measurements: 11.1 x 5.8 x 4.7 cm = volume: 155.8 mL. Echogenicity within normal limits. No mass or hydronephrosis visualized. Bladder: Appears normal for degree of bladder distention. Other: None. IMPRESSION: No hydronephrosis. Electronically Signed   By: Macy Mis M.D.   On: 04/07/2020 09:31   DG Chest Port 1 View  Result Date: 04/10/2020 CLINICAL DATA:  Dyspnea EXAM: PORTABLE CHEST 1 VIEW COMPARISON:  Radiograph 04/04/2020, CT 01/21/2019 FINDINGS: Diffuse interstitial opacities throughout the lungs with more confluent opacity in the right mid to lower lung which is increased from comparison exams. There is indistinctness of the pulmonary vascularity as well as fissural thickening with bilateral pleural effusions. No pneumothorax. Cardiomegaly is similar to prior likely accentuated by abundant mediastinal fat seen on comparison CT. The aorta is calcified. The remaining cardiomediastinal contours are unremarkable. No acute osseous or soft tissue abnormality. Degenerative changes are present in the imaged spine and shoulders. Telemetry leads overlie the chest. Surgical clip at the base of the left neck. IMPRESSION: 1. Findings suggesting CHF with edema and bilateral effusions. More confluent opacity in the right mid to lower lung could reflect alveolar edema or pleural fluid though dense  consolidation of infection could have a similar appearance. 2. Stable cardiomegaly. Electronically Signed   By: Lovena Le M.D.   On: 04/10/2020 15:02   DG Chest Port 1 View  Result Date: 04/04/2020 CLINICAL DATA:  Shortness of breath. Lower extremity edema. Hypoxia. EXAM: PORTABLE CHEST 1 VIEW COMPARISON:  One-view chest x-ray 11/30/2019. FINDINGS: Heart is enlarged. Diffuse interstitial pattern is present. Mild patchy opacities are superimposed on the right. Small effusions are suggested. Degenerative changes are noted at the right [facility] joint. IMPRESSION: 1. Cardiomegaly with diffuse interstitial pattern compatible with edema. 2. Patchy airspace disease on the right compatible with infection. Electronically Signed   By: San Morelle M.D.   On: 04/04/2020 05:08   ECHOCARDIOGRAM COMPLETE  Result Date: 04/05/2020    ECHOCARDIOGRAM REPORT   Patient Name:   KERI TAVELLA Date of Exam: 04/05/2020 Medical Rec #:  [id number]         Height:       69.0 in Accession #:    [id number]        Weight:       184.1 lb Date of Birth:  May 15, 1935         BSA:          1.995 m Patient Age:    89 years          BP:           119/48 mmHg Patient Gender: M                 HR:           61 bpm. Exam Location:  [facility] Procedure: 2D Echo, Cardiac Doppler and Color Doppler Indications:     CHF- acute diastolic 235.57  History:         Patient has prior history of  Echocardiogram examinations, most                  recent 11/26/2019. CHF; Risk Factors:Hypertension and Diabetes.                  AI, PVD.  Sonographer:     Sherrie Sport RDCS (AE) Referring Phys:  [id number] Soledad Gerlach NIU Diagnosing Phys: Neoma Laming MD IMPRESSIONS  1. Left ventricular ejection fraction, by estimation, is 50 to 55%. The left ventricle has low normal function. The left ventricle demonstrates global hypokinesis. Left ventricular diastolic parameters are consistent with Grade II diastolic dysfunction (pseudonormalization).  2. Right ventricular systolic function  is mildly reduced. The right ventricular size is moderately enlarged. There is moderately elevated pulmonary artery systolic pressure.  3. Left atrial size was mild to moderately dilated.  4. Right atrial size was mild to moderately dilated.  5. The mitral valve is grossly normal. Trivial mitral valve regurgitation. No evidence of mitral stenosis.  6. The aortic valve is abnormal. Aortic valve regurgitation is trivial. Moderate aortic valve stenosis.  7. The inferior vena cava is normal in size with greater than 50% respiratory variability, suggesting right atrial pressure of 3 mmHg. FINDINGS  Left Ventricle: Left ventricular ejection fraction, by estimation, is 50 to 55%. The left ventricle has low normal function. The left ventricle demonstrates global hypokinesis. The left ventricular internal cavity size was normal in size. There is no left ventricular hypertrophy. Left ventricular diastolic parameters are consistent with Grade II diastolic dysfunction (pseudonormalization). Right Ventricle: The right ventricular size is moderately enlarged. No increase in right ventricular wall thickness. Right ventricular systolic function is mildly reduced. There is moderately elevated pulmonary artery systolic pressure. The tricuspid regurgitant velocity is 3.38 m/s, and with an assumed right atrial pressure of 10 mmHg, the estimated right ventricular systolic pressure is 96.7 mmHg. Left Atrium: Left atrial size was mild to moderately dilated. Right Atrium: Right atrial size was mild to moderately dilated. Pericardium: There is no evidence of pericardial effusion. Mitral Valve: The mitral valve is grossly normal. Normal mobility of the mitral valve leaflets. Severe mitral annular calcification. Trivial mitral valve regurgitation. No evidence of mitral valve stenosis. Tricuspid Valve: The tricuspid valve is normal in structure. Tricuspid valve regurgitation is mild . No evidence of tricuspid stenosis. Aortic Valve: The aortic  valve is abnormal. Aortic valve regurgitation is trivial. Moderate aortic stenosis is present. Aortic valve mean gradient measures 18.7 mmHg. Aortic valve peak gradient measures 30.0 mmHg. Aortic valve area, by VTI measures 1.21 cm. Pulmonic Valve: The pulmonic valve was normal in structure. Pulmonic valve regurgitation is trivial. No evidence of pulmonic stenosis. Aorta: The aortic root is normal in size and structure. Venous: The inferior vena cava is normal in size with greater than 50% respiratory variability, suggesting right atrial pressure of 3 mmHg. IAS/Shunts: No atrial level shunt detected by color flow Doppler.  LEFT VENTRICLE PLAX 2D LVIDd:         4.37 cm  Diastology LVIDs:         3.14 cm  LV e' lateral:   5.00 cm/s LV PW:         1.41 cm  LV E/e' lateral: 27.4 LV IVS:        0.93 cm  LV e' medial:    5.66 cm/s LVOT diam:     2.20 cm  LV E/e' medial:  24.2 LV SV:         84 LV SV Index:  42 LVOT Area:     3.80 cm  RIGHT VENTRICLE RV Basal diam:  4.46 cm RV S prime:     11.10 cm/s TAPSE (M-mode): 3.4 cm LEFT ATRIUM             Index       RIGHT ATRIUM           Index LA diam:        3.70 cm 1.86 cm/m  RA Area:     24.70 cm LA Vol (A2C):   69.3 ml 34.75 ml/m RA Volume:   83.70 ml  41.96 ml/m LA Vol (A4C):   51.6 ml 25.87 ml/m LA Biplane Vol: 63.5 ml 31.84 ml/m  AORTIC VALVE                    PULMONIC VALVE AV Area (Vmax):    1.32 cm     PV Vmax:        0.79 m/s AV Area (Vmean):   1.21 cm     PV Peak grad:   2.5 mmHg AV Area (VTI):     1.21 cm     RVOT Peak grad: 2 mmHg AV Vmax:           273.67 cm/s AV Vmean:          204.333 cm/s AV VTI:            0.698 m AV Peak Grad:      30.0 mmHg AV Mean Grad:      18.7 mmHg LVOT Vmax:         95.30 cm/s LVOT Vmean:        65.000 cm/s LVOT VTI:          0.222 m LVOT/AV VTI ratio: 0.32  AORTA Ao Root diam: 3.40 cm MITRAL VALVE                TRICUSPID VALVE MV Area (PHT): 2.97 cm     TR Peak grad:   45.7 mmHg MV Decel Time: 255 msec     TR Vmax:         338.00 cm/s MV E velocity: 137.00 cm/s MV A velocity: 102.00 cm/s  SHUNTS MV E/A ratio:  1.34         Systemic VTI:  0.22 m                             Systemic Diam: 2.20 cm Neoma Laming MD Electronically signed by Neoma Laming MD Signature Date/Time: 04/05/2020/6:11:17 PM    Final      Time Spent in minutes  30     Desiree Hane M.D on 04/12/2020 at 9:17 PM  To page go to [email or web address] - password [facility]

## 2020-04-12 NOTE — Progress Notes (Signed)
Attempted to return call to Mr Brett [name] to [phone] number given to staff, and number listed in patient record.  Unable to leave a message at either number.

## 2020-04-13 ENCOUNTER — Inpatient Hospital Stay: Payer: Medicare PPO

## 2020-04-13 DIAGNOSIS — J90 Pleural effusion, not elsewhere classified: Secondary | ICD-10-CM

## 2020-04-13 LAB — GLUCOSE, CAPILLARY
Glucose-Capillary: 97 mg/dL (ref 70–99)
Glucose-Capillary: 196 mg/dL — ABNORMAL HIGH (ref 70–99)
Glucose-Capillary: 140 mg/dL — ABNORMAL HIGH (ref 70–99)
Glucose-Capillary: 135 mg/dL — ABNORMAL HIGH (ref 70–99)

## 2020-04-13 LAB — BASIC METABOLIC PANEL
Anion gap: 9 (ref 5–15)
BUN: 59 mg/dL — ABNORMAL HIGH (ref 8–23)
CO2: 29 mmol/L (ref 22–32)
Calcium: 7.5 mg/dL — ABNORMAL LOW (ref 8.9–10.3)
Chloride: 105 mmol/L (ref 98–111)
Creatinine, Ser: 2.68 mg/dL — ABNORMAL HIGH (ref 0.61–1.24)
GFR calc Af Amer: 24 mL/min — ABNORMAL LOW (ref >60)
GFR calc non Af Amer: 21 mL/min — ABNORMAL LOW (ref >60)
Glucose, Bld: 111 mg/dL — ABNORMAL HIGH (ref 70–99)
Potassium: 4.5 mmol/L (ref 3.5–5.1)
Sodium: 143 mmol/L (ref 135–145)

## 2020-04-13 LAB — CBC
HCT: 24 % — ABNORMAL LOW (ref 39.0–52.0)
Hemoglobin: 7.6 g/dL — ABNORMAL LOW (ref 13.0–17.0)
MCH: 27.1 pg (ref 26.0–34.0)
MCHC: 31.7 g/dL (ref 30.0–36.0)
MCV: 85.7 fL (ref 80.0–100.0)
Platelets: 166 10*3/uL (ref 150–400)
RBC: 2.8 MIL/uL — ABNORMAL LOW (ref 4.22–5.81)
RDW: 15.5 % (ref 11.5–15.5)
WBC: 6.4 10*3/uL (ref 4.0–10.5)
nRBC: 0 % (ref 0.0–0.2)

## 2020-04-13 NOTE — Progress Notes (Signed)
[facility]  ROUNDING NOTE   Subjective:   Patient has no complaints.  Hard of hearing. He states he wants to go home.   Objective:  Vital signs in last 24 hours:  Temp:  [97.6 F (36.4 C)-98.2 F (36.8 C)] 98.1 F (36.7 C) (05/16 0752) Pulse Rate:  [85-98] 98 (05/16 0752) Resp:  [18-20] 18 (05/16 0752) BP: (107-122)/(55-63) 122/63 (05/16 0752) SpO2:  [90 %-94 %] 93 % (05/16 0752) Weight:  [83.6 kg] 83.6 kg (05/16 0350)  Weight change: 0.2 kg Filed Weights   04/11/20 0500 04/12/20 0500 04/13/20 0638  Weight: 85.2 kg 83.4 kg 83.6 kg    Intake/Output: I/O last 3 completed shifts: In: 126 [P.O.:120; I.V.:6] Out: 2390 [Urine:2390]   Intake/Output this shift:  Total I/O In: 3 [I.V.:3] Out: -   Physical Exam: General: NAD, laying in bed  Head: +hard of hearing  Eyes: Anicteric, PERRL  Neck: Supple, trachea midline  Lungs:  Clear to auscultation  Heart: Regular rate and rhythm  Abdomen:  Soft, nontender,   Extremities:  no peripheral edema.  Neurologic: Nonfocal, moving all four extremities  Skin: No lesions       Basic Metabolic Panel: Recent Labs  Lab 04/09/20 0444 04/09/20 0444 04/10/20 0418 04/10/20 0418 04/11/20 0504 04/12/20 0510 04/13/20 0717  NA 142  --  146*  --  143 140 143  K 4.4  --  4.5  --  4.5 4.3 4.5  CL 109  --  113*  --  107 103 105  CO2 26  --  26  --  27 27 29   GLUCOSE 122*  --  68*  --  141* 102* 111*  BUN 78*  --  72*  --  66* 63* 59*  CREATININE 2.93*  --  2.60*  --  2.83* 2.72* 2.68*  CALCIUM 7.1*   < > 7.1*   < > 7.4* 7.2* 7.5*  MG  --   --  1.9  --  2.0 2.0  --   PHOS  --   --  4.0  --  4.3 5.4*  --    < > = values in this interval not displayed.    Liver Function Tests: Recent Labs  Lab 04/10/20 0418  ALBUMIN 2.4*   No results for input(s): LIPASE, AMYLASE in the last 168 hours. No results for input(s): AMMONIA in the last 168 hours.  CBC: Recent Labs  Lab 04/09/20 0444 04/10/20 0418 04/11/20 0504  04/12/20 0510 04/13/20 0717  WBC 8.4 10.0 8.8 7.6 6.4  HGB 7.8* 8.1* 8.4* 7.6* 7.6*  HCT 24.3* 25.4* 26.0* 23.4* 24.0*  MCV 86.8 86.7 85.8 85.4 85.7  PLT 154 143* 131* 146* 166    Cardiac Enzymes: No results for input(s): CKTOTAL, CKMB, CKMBINDEX, TROPONINI in the last 168 hours.  BNP: Invalid input(s): POCBNP  CBG: Recent Labs  Lab 04/12/20 0815 04/12/20 1137 04/12/20 1632 04/12/20 2150 04/13/20 0738  GLUCAP 91 151* 178* 171* 97    Microbiology: Results for orders placed or performed during the hospital encounter of 04/04/20  Blood culture (routine x 2)     Status: None   Collection Time: 04/04/20  4:47 AM   Specimen: Right Antecubital; Blood  Result Value Ref Range Status   Specimen Description RIGHT ANTECUBITAL  Final   Special Requests   Final    BOTTLES DRAWN AEROBIC AND ANAEROBIC Blood Culture adequate volume   Culture   Final    NO GROWTH 5 DAYS Performed  at [facility], [address] [place], [place] [address]    Report Status 04/09/2020 FINAL  Final  Respiratory Panel by RT PCR (Flu A&B, Covid) - Nasopharyngeal Swab     Status: None   Collection Time: 04/04/20  4:48 AM   Specimen: Nasopharyngeal Swab  Result Value Ref Range Status   SARS Coronavirus 2 by RT PCR NEGATIVE NEGATIVE Final    Comment: (NOTE) SARS-CoV-2 target nucleic acids are NOT DETECTED. The SARS-CoV-2 RNA is generally detectable in upper respiratoy specimens during the acute phase of infection. The lowest concentration of SARS-CoV-2 viral copies this assay can detect is 131 copies/mL. A negative result does not preclude SARS-Cov-2 infection and should not be used as the sole basis for treatment or other patient management decisions. A negative result may occur with  improper specimen collection/handling, submission of specimen other than nasopharyngeal swab, presence of viral mutation(s) within the areas targeted by this assay, and inadequate number of viral copies (<131  copies/mL). A negative result must be combined with clinical observations, patient history, and epidemiological information. The expected result is Negative. Fact Sheet for Patients:  PinkCheek.be Fact Sheet for Healthcare Providers:  GravelBags.it This test is not yet ap proved or cleared by the [place] FDA and  has been authorized for detection and/or diagnosis of SARS-CoV-2 by FDA under an Emergency Use Authorization (EUA). This EUA will remain  in effect (meaning this test can be used) for the duration of the COVID-19 declaration under Section 564(b)(1) of the Act, 21 U.S.C. section 360bbb-3(b)(1), unless the authorization is terminated or revoked sooner.    Influenza A by PCR NEGATIVE NEGATIVE Final   Influenza B by PCR NEGATIVE NEGATIVE Final    Comment: (NOTE) The Xpert Xpress SARS-CoV-2/FLU/RSV assay is intended as an aid in  the diagnosis of influenza from Nasopharyngeal swab specimens and  should not be used as a sole basis for treatment. Nasal washings and  aspirates are unacceptable for Xpert Xpress SARS-CoV-2/FLU/RSV  testing. Fact Sheet for Patients: PinkCheek.be Fact Sheet for Healthcare Providers: GravelBags.it This test is not yet approved or cleared by the [place] FDA and  has been authorized for detection and/or diagnosis of SARS-CoV-2 by  FDA under an Emergency Use Authorization (EUA). This EUA will remain  in effect (meaning this test can be used) for the duration of the  Covid-19 declaration under Section 564(b)(1) of the Act, 21  U.S.C. section 360bbb-3(b)(1), unless the authorization is  terminated or revoked. Performed at [facility], [address] [place], [place] [address]   Blood culture (routine x 2)     Status: None   Collection Time: 04/04/20  4:52 AM   Specimen: BLOOD LEFT WRIST  Result Value Ref Range Status    Specimen Description BLOOD LEFT WRIST  Final   Special Requests   Final    BOTTLES DRAWN AEROBIC AND ANAEROBIC Blood Culture adequate volume   Culture   Final    NO GROWTH 5 DAYS Performed at [facility], [address] [place], [place] [address]    Report Status 04/09/2020 FINAL  Final  Urine culture     Status: None   Collection Time: 04/04/20  6:28 AM   Specimen: Urine, Random  Result Value Ref Range Status   Specimen Description   Final    URINE, RANDOM Performed at [facility], [address] [place], [place] [address]    Special Requests   Final    NONE Performed  at [facility], [address] [place], [place] [address]    Culture   Final    NO GROWTH Performed at [facility], [place] [address], [place], [place] [address]    Report Status 04/05/2020 FINAL  Final    Coagulation Studies: No results for input(s): LABPROT, INR in the last 72 hours.  Urinalysis: No results for input(s): COLORURINE, LABSPEC, PHURINE, GLUCOSEU, HGBUR, BILIRUBINUR, KETONESUR, PROTEINUR, UROBILINOGEN, NITRITE, LEUKOCYTESUR in the last 72 hours.  Invalid input(s): APPERANCEUR    Imaging: DG Chest Port 1 View  Result Date: 04/13/2020 CLINICAL DATA:  Is seen follow-up chest x-ray, bilateral pleural effusions and pulmonary edema. CS EXAM: PORTABLE CHEST 1 VIEW COMPARISON:  Chest x-rays dated 04/10/2020 and 04/04/2020. FINDINGS: Stable cardiomegaly. Slightly improved aeration bilaterally suggesting improved fluid status. Probable small bilateral pleural effusions and/or bibasilar atelectasis. No pneumothorax is seen. Osseous structures about the chest are unremarkable. IMPRESSION: 1. Slightly improved aeration bilaterally suggesting improved fluid status. 2. Probable small bilateral pleural effusions and/or bibasilar atelectasis. No evidence of pneumonia on today's exam. 3. Stable cardiomegaly. Electronically Signed   By: Franki Cabot M.D.   On: 04/13/2020  08:46     Medications:   . sodium chloride    . iron sucrose 200 mg (04/12/20 1523)   . amLODipine  5 mg Oral Daily  . clopidogrel  75 mg Oral Daily  . enoxaparin (LOVENOX) injection  30 mg Subcutaneous Q24H  . folic acid  1 mg Oral Daily  . gabapentin  100 mg Oral BID  . hydrALAZINE  100 mg Oral BID  . insulin aspart  0-15 Units Subcutaneous TID WC  . insulin aspart  0-5 Units Subcutaneous QHS  . insulin glargine  20 Units Subcutaneous QHS  . ipratropium-albuterol  3 mL Nebulization TID  . isosorbide mononitrate  30 mg Oral Daily  . metoprolol  200 mg Oral Daily  . pantoprazole  40 mg Oral Daily  . polyethylene glycol  17 g Oral Daily  . rosuvastatin  10 mg Oral Daily  . sodium chloride flush  3 mL Intravenous Q12H  . tamsulosin  0.4 mg Oral QPC supper   sodium chloride, acetaminophen, albuterol, dextromethorphan-guaiFENesin, hydrALAZINE, ondansetron (ZOFRAN) IV, sodium chloride flush  Assessment/ Plan:  Mr. LYMON KIDNEY is a 85 y.o. white male with B-cell lymphoma, aortic regurgitation, CAD, CHF, diabetes mellitus type 2, COPD, hypertension, iron deficiency anemia, peptic ulcer disease, peripheral vascular disease who was admitted to [facility] on 04/04/2020 for Acute pulmonary edema ([place]) [J81.0] Acute respiratory failure with hypoxia ([place]) [J96.01] CHF, acute on chronic (HCC) [I50.9] Acute on chronic diastolic CHF (congestive heart failure) ([place]) [I50.33] Community acquired pneumonia, unspecified laterality [J18.9]  1. Acute renal failure on chronic kidney disease stage IIIB: baseline creatinine of 1.79, GFR of 35 on 12/05/19. Most likely patient may be at a new baseline creatinine  Chronic Kidney disease secondary to diabetes Acute renal failure secondary to ATN Creatinine is improving.   2. Hypertension: 122/63. Current regimen of tamsulosin, metoprolol, isosorbide mononitrate, hydralazine and amlodipine.  Holding furosemide.   3. Anemia with chronic kidney disease:  normocytic. Hemoglobin 7.6.  Not currently a candidate for ESA with malignancy.   Will need outpatient follow up with nephrology.    LOS: 9 Sarath Kolluru 5/16/202111:40 AM

## 2020-04-13 NOTE — Progress Notes (Signed)
[facility]  PROGRESS NOTE  Brett [name] JSE:[id number] DOB: 1935/03/24 DOA: 04/04/2020 PCP: Perrin Maltese, MD Admit date - 04/04/2020   Admitting Physician Ivor Costa, MD  Outpatient Primary MD for the patient is Perrin Maltese, MD  LOS - 9 Brief Narrative   Brett [name] is a 85 y.o. year old male with medical history significant for  B-cell lymphoma, aortic regurgitation, CAD, CHF, diabetes mellitus type 2, COPD on 2 L, hypertension, iron deficiency anemia, peptic ulcer disease, peripheral vascular disease who presented on 04/04/2020 with shortness of breath, lower extremity edema, worsening hypoxia and was found to have acute on chronic hypoxic respiratory failure secondary to pulmonary edema related to CHF exacerbation and concern for pneumonia. In work-up Legionella, strep pneumo, blood cultures, Covid testing all unremarkable. BNP elevated at 591 on admission with signs of pulmonary edema on chest x-ray consistent with CHF, some concern for potential infection with right airspace patchy disease, procalcitonin was less than 0.10. Patient started on vancomycin, cefepime in ED as well as IV Solu-Medrol, duo nebs for COPD as well as IV Lasix and BiPAP for CHF exacerbation.   Hospital course complicated by AKI in setting of IV Lasix for CHF exacerbation  Subjective  Today denies any pain.  Continues to report stable breathing.  Really wants to go home.  Still has mild cough  A & P   Acute on chronic diastolic CHF, improving. Net negative almost 6 L this admission, back on home O2 regimen of 2 L, repeat chest x-ray shows slight improvement in pleural effusion/edema, no overt hypervolemia on exam. EF preserved with 50 to 07% grade 2 diastolic dysfunction -Holding Lasix in the setting of AKI which is currently improving (PTA dose 20 mg daily) -Continue to monitor ins and outs -Daily weights  Acute on chronic hypoxic respiratory failure, improving. Pulmonary edema with bilateral  effusions on chest x-ray (5/13), effusion is too small to warrant thoracentesis. Currently holding off on IV Lasix given AKI. Completed 5-day course of ceftriaxone and 3-day course of azithromycin for CAP as well as a course of prednisone for potential COPD flare. Now back to home O2 of 2 L -monitor O2  AKI on CKD stage III related to diabetes. Improving. AKI related to ATN, creatinine improving off Lasix. No hydronephrosis on renal ultrasound. May have new baseline -Continue to monitor BMP -Nephrology following, will need outpatient follow-up -Avoid nephrotoxins  Hypertension, at goal -Continue home regimen: Tamsulosin, metoprolol, Imdur, drowsy, amlodipine  Acute on chronic anemia of CKD. Iron panel consistent with iron deficiency anemia. Previous baseline hemoglobin of 9.8, around 7.2-7.8 currently. No current signs or symptoms of bleeding -Continue to monitor for IV daily for five total days, follow-up with vitamin C/oral supplementation on discharge -Transfuse if hemoglobin less than seven not currently candidate for ESA with malignancy (B-cell lymphoma)  History of CAD/CVA. Asymptomatic. -Continue Plavix, Crestor, Imdur  Type 2 diabetes -Lantus 20 units,, stable monitor CBGs, sliding scale as needed  COPD, no current wheezing, stable -Continue to wean O2 back to home regimen -DuoNebs 3 times daily  GERD, stable  --continue Protonix  Chronic atrial fibrillation, rate controlled. Not on anticoagulation with history of previous GI bleed. Amiodarone since hospitalization on 04/2019 with new onset AFIB at the time.  --home amiodarone Has been on hold since admission --seen by Dr. Neoma Laming last hospital stay in 04/2019, will ensure outpatient follow up prior to discharge -Continue metoprolol  Hyperlipidemia, stable continue statin  BPH-continue Flomax -Monitor bladder  scans  Goals of care discussion. Palliative care consulted, unable to reach his familial support -Home with  home health with stable, outpatient palliative services to follow which patient agreeable with         Family Communication  : None, called Claudette Head per patient request at listed numbers with no answer and unable to leave voice mail Code Status : DNR  Disposition Plan  :  Patient is from home. Anticipated d/c date: 24- 48 hours. Barriers to d/c or necessity for inpatient status:  Continued improvement in kidney function, stable respiratory status/volume status off diuresis Consults  : Nephrology  Procedures  : TTE, 04/05/2020 EF 79-89%, grade 2 diastolic dysfunction  DVT Prophylaxis  :  Lovenox   Lab Results  Component Value Date   PLT 166 04/13/2020    Diet :  Diet Order            Diet 2 gram sodium Room service appropriate? Yes; Fluid consistency: Thin; Fluid restriction: 1500 mL Fluid  Diet effective now               Inpatient Medications Scheduled Meds: . amLODipine  5 mg Oral Daily  . clopidogrel  75 mg Oral Daily  . enoxaparin (LOVENOX) injection  30 mg Subcutaneous Q24H  . folic acid  1 mg Oral Daily  . gabapentin  100 mg Oral BID  . hydrALAZINE  100 mg Oral BID  . insulin aspart  0-15 Units Subcutaneous TID WC  . insulin aspart  0-5 Units Subcutaneous QHS  . insulin glargine  20 Units Subcutaneous QHS  . ipratropium-albuterol  3 mL Nebulization TID  . isosorbide mononitrate  30 mg Oral Daily  . metoprolol  200 mg Oral Daily  . pantoprazole  40 mg Oral Daily  . polyethylene glycol  17 g Oral Daily  . rosuvastatin  10 mg Oral Daily  . sodium chloride flush  3 mL Intravenous Q12H  . tamsulosin  0.4 mg Oral QPC supper   Continuous Infusions: . sodium chloride    . iron sucrose 200 mg (04/13/20 1335)   PRN Meds:.sodium chloride, acetaminophen, albuterol, dextromethorphan-guaiFENesin, hydrALAZINE, ondansetron (ZOFRAN) IV, sodium chloride flush  Antibiotics  :   Anti-infectives (From admission, onward)   Start     Dose/Rate Route Frequency Ordered  Stop   04/05/20 1000  azithromycin (ZITHROMAX) 500 mg in sodium chloride 0.9 % 250 mL IVPB     500 mg 250 mL/hr over 60 Minutes Intravenous Every 24 hours 04/04/20 0844 04/06/20 1232   04/05/20 1000  cefTRIAXone (ROCEPHIN) 1 g in sodium chloride 0.9 % 100 mL IVPB     1 g 200 mL/hr over 30 Minutes Intravenous Every 24 hours 04/04/20 0844 04/08/20 0836   04/04/20 0645  vancomycin (VANCOCIN) IVPB 1000 mg/200 mL premix     1,000 mg 200 mL/hr over 60 Minutes Intravenous  Once 04/04/20 0639 04/04/20 0905   04/04/20 0600  vancomycin (VANCOCIN) IVPB 1000 mg/200 mL premix     1,000 mg 200 mL/hr over 60 Minutes Intravenous  Once 04/04/20 0556 04/04/20 0753   04/04/20 0600  ceFEPIme (MAXIPIME) 2 g in sodium chloride 0.9 % 100 mL IVPB     2 g 200 mL/hr over 30 Minutes Intravenous  Once 04/04/20 0556 04/04/20 0706   04/04/20 0600  azithromycin (ZITHROMAX) 500 mg in sodium chloride 0.9 % 250 mL IVPB     500 mg 250 mL/hr over 60 Minutes Intravenous  Once 04/04/20 0556 04/04/20 0753  Objective   Vitals:   04/12/20 2202 04/13/20 0040 04/13/20 0638 04/13/20 0752  BP: (!) 107/55 (!) 109/58  122/63  Pulse: 85 88  98  Resp: 20 20  18   Temp: 97.6 F (36.4 C) 98.2 F (36.8 C)  98.1 F (36.7 C)  TempSrc: Oral Oral  Oral  SpO2: 90% 94%  93%  Weight:   83.6 kg   Height:        SpO2: 93 % O2 Flow Rate (L/min): 2 L/min FiO2 (%): 40 %  Wt Readings from Last 3 Encounters:  04/13/20 83.6 kg  12/04/19 89.3 kg  07/14/19 90.7 kg     Intake/Output Summary (Last 24 hours) at 04/13/2020 1537 Last data filed at 04/13/2020 1354 Gross per 24 hour  Intake 366 ml  Output 1090 ml  Net -724 ml    Physical Exam:     Awake Alert, Oriented X 3, Normal affect Very hard of hearing No new F.N deficits,  [place].AT, Normal respiratory effort on on 2L, no appreciable crackles or rhonchi RRR +ve B.Sounds, Abd Soft, No tenderness, No rebound, guarding or rigidity. No Cyanosis, No new Rash or bruise      I have personally reviewed the following:   Data Reviewed:  CBC Recent Labs  Lab 04/09/20 0444 04/10/20 0418 04/11/20 0504 04/12/20 0510 04/13/20 0717  WBC 8.4 10.0 8.8 7.6 6.4  HGB 7.8* 8.1* 8.4* 7.6* 7.6*  HCT 24.3* 25.4* 26.0* 23.4* 24.0*  PLT 154 143* 131* 146* 166  MCV 86.8 86.7 85.8 85.4 85.7  MCH 27.9 27.6 27.7 27.7 27.1  MCHC 32.1 31.9 32.3 32.5 31.7  RDW 15.2 15.3 15.6* 15.6* 15.5    Chemistries  Recent Labs  Lab 04/09/20 0444 04/10/20 0418 04/11/20 0504 04/12/20 0510 04/13/20 0717  NA 142 146* 143 140 143  K 4.4 4.5 4.5 4.3 4.5  CL 109 113* 107 103 105  CO2 26 26 27 27 29   GLUCOSE 122* 68* 141* 102* 111*  BUN 78* 72* 66* 63* 59*  CREATININE 2.93* 2.60* 2.83* 2.72* 2.68*  CALCIUM 7.1* 7.1* 7.4* 7.2* 7.5*  MG  --  1.9 2.0 2.0  --    ------------------------------------------------------------------------------------------------------------------ No results for input(s): CHOL, HDL, LDLCALC, TRIG, CHOLHDL, LDLDIRECT in the last 72 hours.  Lab Results  Component Value Date   HGBA1C 12.4 (H) 11/27/2019   ------------------------------------------------------------------------------------------------------------------ No results for input(s): TSH, T4TOTAL, T3FREE, THYROIDAB in the last 72 hours.  Invalid input(s): FREET3 ------------------------------------------------------------------------------------------------------------------ Recent Labs    04/11/20 0504 04/11/20 0849  VITAMINB12  --  201  FOLATE 4.8*  --   TIBC 221*  --   IRON 11*  --     Coagulation profile No results for input(s): INR, PROTIME in the last 168 hours.  No results for input(s): DDIMER in the last 72 hours.  Cardiac Enzymes No results for input(s): CKMB, TROPONINI, MYOGLOBIN in the last 168 hours.  Invalid input(s): CK ------------------------------------------------------------------------------------------------------------------    Component Value Date/Time    BNP 301.0 (H) 04/10/2020 2671    Micro Results Recent Results (from the past 240 hour(s))  Blood culture (routine x 2)     Status: None   Collection Time: 04/04/20  4:47 AM   Specimen: Right Antecubital; Blood  Result Value Ref Range Status   Specimen Description RIGHT ANTECUBITAL  Final   Special Requests   Final    BOTTLES DRAWN AEROBIC AND ANAEROBIC Blood Culture adequate volume   Culture   Final    NO GROWTH  5 DAYS Performed at [facility], [address] [place], [place] [address]    Report Status 04/09/2020 FINAL  Final  Respiratory Panel by RT PCR (Flu A&B, Covid) - Nasopharyngeal Swab     Status: None   Collection Time: 04/04/20  4:48 AM   Specimen: Nasopharyngeal Swab  Result Value Ref Range Status   SARS Coronavirus 2 by RT PCR NEGATIVE NEGATIVE Final    Comment: (NOTE) SARS-CoV-2 target nucleic acids are NOT DETECTED. The SARS-CoV-2 RNA is generally detectable in upper respiratoy specimens during the acute phase of infection. The lowest concentration of SARS-CoV-2 viral copies this assay can detect is 131 copies/mL. A negative result does not preclude SARS-Cov-2 infection and should not be used as the sole basis for treatment or other patient management decisions. A negative result may occur with  improper specimen collection/handling, submission of specimen other than nasopharyngeal swab, presence of viral mutation(s) within the areas targeted by this assay, and inadequate number of viral copies (<131 copies/mL). A negative result must be combined with clinical observations, patient history, and epidemiological information. The expected result is Negative. Fact Sheet for Patients:  PinkCheek.be Fact Sheet for Healthcare Providers:  GravelBags.it This test is not yet ap proved or cleared by the [place] FDA and  has been authorized for detection and/or diagnosis of SARS-CoV-2 by FDA under  an Emergency Use Authorization (EUA). This EUA will remain  in effect (meaning this test can be used) for the duration of the COVID-19 declaration under Section 564(b)(1) of the Act, 21 U.S.C. section 360bbb-3(b)(1), unless the authorization is terminated or revoked sooner.    Influenza A by PCR NEGATIVE NEGATIVE Final   Influenza B by PCR NEGATIVE NEGATIVE Final    Comment: (NOTE) The Xpert Xpress SARS-CoV-2/FLU/RSV assay is intended as an aid in  the diagnosis of influenza from Nasopharyngeal swab specimens and  should not be used as a sole basis for treatment. Nasal washings and  aspirates are unacceptable for Xpert Xpress SARS-CoV-2/FLU/RSV  testing. Fact Sheet for Patients: PinkCheek.be Fact Sheet for Healthcare Providers: GravelBags.it This test is not yet approved or cleared by the [place] FDA and  has been authorized for detection and/or diagnosis of SARS-CoV-2 by  FDA under an Emergency Use Authorization (EUA). This EUA will remain  in effect (meaning this test can be used) for the duration of the  Covid-19 declaration under Section 564(b)(1) of the Act, 21  U.S.C. section 360bbb-3(b)(1), unless the authorization is  terminated or revoked. Performed at [facility], [address] [place], [place] [address]   Blood culture (routine x 2)     Status: None   Collection Time: 04/04/20  4:52 AM   Specimen: BLOOD LEFT WRIST  Result Value Ref Range Status   Specimen Description BLOOD LEFT WRIST  Final   Special Requests   Final    BOTTLES DRAWN AEROBIC AND ANAEROBIC Blood Culture adequate volume   Culture   Final    NO GROWTH 5 DAYS Performed at [facility], [address] [place], [place] [address]    Report Status 04/09/2020 FINAL  Final  Urine culture     Status: None   Collection Time: 04/04/20  6:28 AM   Specimen: Urine, Random  Result Value Ref Range Status   Specimen Description    Final    URINE, RANDOM Performed at [facility], [address], [place], [place] [address]    Special Requests   Final  NONE Performed at [facility], [address], [place], [place] [address]    Culture   Final    NO GROWTH Performed at [facility], [place] [address] [place], [place] [address]    Report Status 04/05/2020 FINAL  Final    Radiology Reports [place] CHEST (PLEURAL EFFUSION)  Result Date: 04/11/2020 CLINICAL DATA:  Evaluate for pleural effusions and thoracentesis. EXAM: CHEST ULTRASOUND COMPARISON:  Chest radiograph 04/10/2020 FINDINGS: Both sides of the chest were evaluated with ultrasound. Trace pleural fluid on both sides of the chest. IMPRESSION: Trace bilateral pleural effusions. Thoracentesis not performed due to the small pleural fluid volumes. Electronically Signed   By: Markus Daft M.D.   On: 04/11/2020 12:51   US RENAL  Result Date: 04/07/2020 CLINICAL DATA:  AKI EXAM: RENAL / URINARY TRACT ULTRASOUND COMPLETE COMPARISON:  None. FINDINGS: Right Kidney: Renal measurements: 10.2 x 5 x 4.5 cm = volume: 120.4 mL . Echogenicity within normal limits. Two small cysts. No solid mass or hydronephrosis visualized. Left Kidney: Renal measurements: 11.1 x 5.8 x 4.7 cm = volume: 155.8 mL. Echogenicity within normal limits. No mass or hydronephrosis visualized. Bladder: Appears normal for degree of bladder distention. Other: None. IMPRESSION: No hydronephrosis. Electronically Signed   By: Macy Mis M.D.   On: 04/07/2020 09:31   DG Chest Port 1 View  Result Date: 04/13/2020 CLINICAL DATA:  Is seen follow-up chest x-ray, bilateral pleural effusions and pulmonary edema. CS EXAM: PORTABLE CHEST 1 VIEW COMPARISON:  Chest x-rays dated 04/10/2020 and 04/04/2020. FINDINGS: Stable cardiomegaly. Slightly improved aeration bilaterally suggesting improved fluid status. Probable small bilateral pleural effusions and/or bibasilar atelectasis. No pneumothorax is  seen. Osseous structures about the chest are unremarkable. IMPRESSION: 1. Slightly improved aeration bilaterally suggesting improved fluid status. 2. Probable small bilateral pleural effusions and/or bibasilar atelectasis. No evidence of pneumonia on today's exam. 3. Stable cardiomegaly. Electronically Signed   By: Franki Cabot M.D.   On: 04/13/2020 08:46   DG Chest Port 1 View  Result Date: 04/10/2020 CLINICAL DATA:  Dyspnea EXAM: PORTABLE CHEST 1 VIEW COMPARISON:  Radiograph 04/04/2020, CT 01/21/2019 FINDINGS: Diffuse interstitial opacities throughout the lungs with more confluent opacity in the right mid to lower lung which is increased from comparison exams. There is indistinctness of the pulmonary vascularity as well as fissural thickening with bilateral pleural effusions. No pneumothorax. Cardiomegaly is similar to prior likely accentuated by abundant mediastinal fat seen on comparison CT. The aorta is calcified. The remaining cardiomediastinal contours are unremarkable. No acute osseous or soft tissue abnormality. Degenerative changes are present in the imaged spine and shoulders. Telemetry leads overlie the chest. Surgical clip at the base of the left neck. IMPRESSION: 1. Findings suggesting CHF with edema and bilateral effusions. More confluent opacity in the right mid to lower lung could reflect alveolar edema or pleural fluid though dense consolidation of infection could have a similar appearance. 2. Stable cardiomegaly. Electronically Signed   By: Lovena Le M.D.   On: 04/10/2020 15:02   DG Chest Port 1 View  Result Date: 04/04/2020 CLINICAL DATA:  Shortness of breath. Lower extremity edema. Hypoxia. EXAM: PORTABLE CHEST 1 VIEW COMPARISON:  One-view chest x-ray 11/30/2019. FINDINGS: Heart is enlarged. Diffuse interstitial pattern is present. Mild patchy opacities are superimposed on the right. Small effusions are suggested. Degenerative changes are noted at the right [facility] joint. IMPRESSION: 1.  Cardiomegaly with diffuse interstitial pattern compatible with edema. 2. Patchy airspace disease on the right compatible with infection.  Electronically Signed   By: San Morelle M.D.   On: 04/04/2020 05:08   ECHOCARDIOGRAM COMPLETE  Result Date: 04/05/2020    ECHOCARDIOGRAM REPORT   Patient Name:   MARKON JARES Date of Exam: 04/05/2020 Medical Rec #:  [id number]         Height:       69.0 in Accession #:    [id number]        Weight:       184.1 lb Date of Birth:  [birth year]         BSA:          1.995 m Patient Age:    31 years          BP:           119/48 mmHg Patient Gender: M                 HR:           61 bpm. Exam Location:  [facility] Procedure: 2D Echo, Cardiac Doppler and Color Doppler Indications:     CHF- acute diastolic 716.96  History:         Patient has prior history of Echocardiogram examinations, most                  recent 11/26/2019. CHF; Risk Factors:Hypertension and Diabetes.                  AI, PVD.  Sonographer:     Sherrie Sport RDCS (AE) Referring Phys:  [id number] Soledad Gerlach NIU Diagnosing Phys: Neoma Laming MD IMPRESSIONS  1. Left ventricular ejection fraction, by estimation, is 50 to 55%. The left ventricle has low normal function. The left ventricle demonstrates global hypokinesis. Left ventricular diastolic parameters are consistent with Grade II diastolic dysfunction (pseudonormalization).  2. Right ventricular systolic function is mildly reduced. The right ventricular size is moderately enlarged. There is moderately elevated pulmonary artery systolic pressure.  3. Left atrial size was mild to moderately dilated.  4. Right atrial size was mild to moderately dilated.  5. The mitral valve is grossly normal. Trivial mitral valve regurgitation. No evidence of mitral stenosis.  6. The aortic valve is abnormal. Aortic valve regurgitation is trivial. Moderate aortic valve stenosis.  7. The inferior vena cava is normal in size with greater than 50% respiratory variability, suggesting right atrial  pressure of 3 mmHg. FINDINGS  Left Ventricle: Left ventricular ejection fraction, by estimation, is 50 to 55%. The left ventricle has low normal function. The left ventricle demonstrates global hypokinesis. The left ventricular internal cavity size was normal in size. There is no left ventricular hypertrophy. Left ventricular diastolic parameters are consistent with Grade II diastolic dysfunction (pseudonormalization). Right Ventricle: The right ventricular size is moderately enlarged. No increase in right ventricular wall thickness. Right ventricular systolic function is mildly reduced. There is moderately elevated pulmonary artery systolic pressure. The tricuspid regurgitant velocity is 3.38 m/s, and with an assumed right atrial pressure of 10 mmHg, the estimated right ventricular systolic pressure is 81.0 mmHg. Left Atrium: Left atrial size was mild to moderately dilated. Right Atrium: Right atrial size was mild to moderately dilated. Pericardium: There is no evidence of pericardial effusion. Mitral Valve: The mitral valve is grossly normal. Normal mobility of the mitral valve leaflets. Severe mitral annular calcification. Trivial mitral valve regurgitation. No evidence of mitral valve stenosis. Tricuspid Valve: The tricuspid valve is normal in structure. Tricuspid valve regurgitation is mild . No evidence of tricuspid  stenosis. Aortic Valve: The aortic valve is abnormal. Aortic valve regurgitation is trivial. Moderate aortic stenosis is present. Aortic valve mean gradient measures 18.7 mmHg. Aortic valve peak gradient measures 30.0 mmHg. Aortic valve area, by VTI measures 1.21 cm. Pulmonic Valve: The pulmonic valve was normal in structure. Pulmonic valve regurgitation is trivial. No evidence of pulmonic stenosis. Aorta: The aortic root is normal in size and structure. Venous: The inferior vena cava is normal in size with greater than 50% respiratory variability, suggesting right atrial pressure of 3 mmHg.  IAS/Shunts: No atrial level shunt detected by color flow Doppler.  LEFT VENTRICLE PLAX 2D LVIDd:         4.37 cm  Diastology LVIDs:         3.14 cm  LV e' lateral:   5.00 cm/s LV PW:         1.41 cm  LV E/e' lateral: 27.4 LV IVS:        0.93 cm  LV e' medial:    5.66 cm/s LVOT diam:     2.20 cm  LV E/e' medial:  24.2 LV SV:         84 LV SV Index:   42 LVOT Area:     3.80 cm  RIGHT VENTRICLE RV Basal diam:  4.46 cm RV S prime:     11.10 cm/s TAPSE (M-mode): 3.4 cm LEFT ATRIUM             Index       RIGHT ATRIUM           Index LA diam:        3.70 cm 1.86 cm/m  RA Area:     24.70 cm LA Vol (A2C):   69.3 ml 34.75 ml/m RA Volume:   83.70 ml  41.96 ml/m LA Vol (A4C):   51.6 ml 25.87 ml/m LA Biplane Vol: 63.5 ml 31.84 ml/m  AORTIC VALVE                    PULMONIC VALVE AV Area (Vmax):    1.32 cm     PV Vmax:        0.79 m/s AV Area (Vmean):   1.21 cm     PV Peak grad:   2.5 mmHg AV Area (VTI):     1.21 cm     RVOT Peak grad: 2 mmHg AV Vmax:           273.67 cm/s AV Vmean:          204.333 cm/s AV VTI:            0.698 m AV Peak Grad:      30.0 mmHg AV Mean Grad:      18.7 mmHg LVOT Vmax:         95.30 cm/s LVOT Vmean:        65.000 cm/s LVOT VTI:          0.222 m LVOT/AV VTI ratio: 0.32  AORTA Ao Root diam: 3.40 cm MITRAL VALVE                TRICUSPID VALVE MV Area (PHT): 2.97 cm     TR Peak grad:   45.7 mmHg MV Decel Time: 255 msec     TR Vmax:        338.00 cm/s MV E velocity: 137.00 cm/s MV A velocity: 102.00 cm/s  SHUNTS MV E/A ratio:  1.34         Systemic  VTI:  0.22 m                             Systemic Diam: 2.20 cm Neoma Laming MD Electronically signed by Neoma Laming MD Signature Date/Time: 04/05/2020/6:11:17 PM    Final      Time Spent in minutes  30     Desiree Hane M.D on 04/13/2020 at 3:37 PM  To page go to [email or web address] - password [facility]

## 2020-04-13 NOTE — Progress Notes (Signed)
BP was 109/65 at 2220. BP was 102/56 at 1550. Hydralazine 100mg  PO was held. NP Ouma notified via secure chat. Assigned nurse Carlotta informed at 2247.

## 2020-04-14 ENCOUNTER — Ambulatory Visit: Payer: Medicare PPO | Admitting: Family

## 2020-04-14 DIAGNOSIS — I959 Hypotension, unspecified: Secondary | ICD-10-CM

## 2020-04-14 LAB — CBC
HCT: 23.3 % — ABNORMAL LOW (ref 39.0–52.0)
Hemoglobin: 7.3 g/dL — ABNORMAL LOW (ref 13.0–17.0)
MCH: 27.1 pg (ref 26.0–34.0)
MCHC: 31.3 g/dL (ref 30.0–36.0)
MCV: 86.6 fL (ref 80.0–100.0)
Platelets: 158 10*3/uL (ref 150–400)
RBC: 2.69 MIL/uL — ABNORMAL LOW (ref 4.22–5.81)
RDW: 15.6 % — ABNORMAL HIGH (ref 11.5–15.5)
WBC: 5.7 10*3/uL (ref 4.0–10.5)
nRBC: 0 % (ref 0.0–0.2)

## 2020-04-14 LAB — BASIC METABOLIC PANEL
Anion gap: 10 (ref 5–15)
BUN: 54 mg/dL — ABNORMAL HIGH (ref 8–23)
CO2: 27 mmol/L (ref 22–32)
Calcium: 7.4 mg/dL — ABNORMAL LOW (ref 8.9–10.3)
Chloride: 105 mmol/L (ref 98–111)
Creatinine, Ser: 3.02 mg/dL — ABNORMAL HIGH (ref 0.61–1.24)
GFR calc Af Amer: 21 mL/min — ABNORMAL LOW (ref >60)
GFR calc non Af Amer: 18 mL/min — ABNORMAL LOW (ref >60)
Glucose, Bld: 138 mg/dL — ABNORMAL HIGH (ref 70–99)
Potassium: 4.4 mmol/L (ref 3.5–5.1)
Sodium: 142 mmol/L (ref 135–145)

## 2020-04-14 LAB — GLUCOSE, CAPILLARY
Glucose-Capillary: 249 mg/dL — ABNORMAL HIGH (ref 70–99)
Glucose-Capillary: 114 mg/dL — ABNORMAL HIGH (ref 70–99)
Glucose-Capillary: 187 mg/dL — ABNORMAL HIGH (ref 70–99)
Glucose-Capillary: 140 mg/dL — ABNORMAL HIGH (ref 70–99)
Glucose-Capillary: 117 mg/dL — ABNORMAL HIGH (ref 70–99)

## 2020-04-14 MED ORDER — HYDRALAZINE HCL 50 MG PO TABS
50.0000 mg | ORAL_TABLET | Freq: Two times a day (BID) | ORAL | Status: DC
  Administered 2020-04-14: 50 mg via ORAL
  Filled 2020-04-14: qty 1

## 2020-04-14 NOTE — Progress Notes (Signed)
[facility]  ROUNDING NOTE   Subjective:   Patient has no complaints.  Hard of hearing.   Creatinine 3.02 (2.68) UOP 1600  Objective:  Vital signs in last 24 hours:  Temp:  [97.6 F (36.4 C)-98 F (36.7 C)] 97.6 F (36.4 C) (05/17 0741) Pulse Rate:  [89-92] 92 (05/17 0741) Resp:  [16-18] 18 (05/17 0030) BP: (102-125)/(56-65) 125/61 (05/17 0741) SpO2:  [93 %-98 %] 98 % (05/17 0741)  Weight change:  Filed Weights   04/11/20 0500 04/12/20 0500 04/13/20 0638  Weight: 85.2 kg 83.4 kg 83.6 kg    Intake/Output: I/O last 3 completed shifts: In: 17 [P.O.:480; I.V.:6] Out: 2600 [Urine:2600]   Intake/Output this shift:  Total I/O In: 480 [P.O.:480] Out: 200 [Urine:200]  Physical Exam: General: NAD, laying in bed  Head: +hard of hearing  Eyes: Anicteric, PERRL  Neck: Supple, trachea midline  Lungs:  Clear to auscultation  Heart: Regular rate and rhythm  Abdomen:  Soft, nontender,   Extremities:  no peripheral edema.  Neurologic: Nonfocal, moving all four extremities  Skin: No lesions       Basic Metabolic Panel: Recent Labs  Lab 04/10/20 0418 04/10/20 0418 04/11/20 0504 04/11/20 0504 04/12/20 0510 04/13/20 0717 04/14/20 0401  NA 146*  --  143  --  140 143 142  K 4.5  --  4.5  --  4.3 4.5 4.4  CL 113*  --  107  --  103 105 105  CO2 26  --  27  --  27 29 27   GLUCOSE 68*  --  141*  --  102* 111* 138*  BUN 72*  --  66*  --  63* 59* 54*  CREATININE 2.60*  --  2.83*  --  2.72* 2.68* 3.02*  CALCIUM 7.1*   < > 7.4*   < > 7.2* 7.5* 7.4*  MG 1.9  --  2.0  --  2.0  --   --   PHOS 4.0  --  4.3  --  5.4*  --   --    < > = values in this interval not displayed.    Liver Function Tests: Recent Labs  Lab 04/10/20 0418  ALBUMIN 2.4*   No results for input(s): LIPASE, AMYLASE in the last 168 hours. No results for input(s): AMMONIA in the last 168 hours.  CBC: Recent Labs  Lab 04/10/20 0418 04/11/20 0504 04/12/20 0510 04/13/20 0717  04/14/20 0401  WBC 10.0 8.8 7.6 6.4 5.7  HGB 8.1* 8.4* 7.6* 7.6* 7.3*  HCT 25.4* 26.0* 23.4* 24.0* 23.3*  MCV 86.7 85.8 85.4 85.7 86.6  PLT 143* 131* 146* 166 158    Cardiac Enzymes: No results for input(s): CKTOTAL, CKMB, CKMBINDEX, TROPONINI in the last 168 hours.  BNP: Invalid input(s): POCBNP  CBG: Recent Labs  Lab 04/13/20 1152 04/13/20 1653 04/13/20 2119 04/14/20 0744 04/14/20 1135  GLUCAP 196* 140* 135* 114* 187*    Microbiology: Results for orders placed or performed during the hospital encounter of 04/04/20  Blood culture (routine x 2)     Status: None   Collection Time: 04/04/20  4:47 AM   Specimen: Right Antecubital; Blood  Result Value Ref Range Status   Specimen Description RIGHT ANTECUBITAL  Final   Special Requests   Final    BOTTLES DRAWN AEROBIC AND ANAEROBIC Blood Culture adequate volume   Culture   Final    NO GROWTH 5 DAYS Performed at [facility], [address] [place], [place]  [address]    Report Status 04/09/2020 FINAL  Final  Respiratory Panel by RT PCR (Flu A&B, Covid) - Nasopharyngeal Swab     Status: None   Collection Time: 04/04/20  4:48 AM   Specimen: Nasopharyngeal Swab  Result Value Ref Range Status   SARS Coronavirus 2 by RT PCR NEGATIVE NEGATIVE Final    Comment: (NOTE) SARS-CoV-2 target nucleic acids are NOT DETECTED. The SARS-CoV-2 RNA is generally detectable in upper respiratoy specimens during the acute phase of infection. The lowest concentration of SARS-CoV-2 viral copies this assay can detect is 131 copies/mL. A negative result does not preclude SARS-Cov-2 infection and should not be used as the sole basis for treatment or other patient management decisions. A negative result may occur with  improper specimen collection/handling, submission of specimen other than nasopharyngeal swab, presence of viral mutation(s) within the areas targeted by this assay, and inadequate number of viral copies (<131 copies/mL).  A negative result must be combined with clinical observations, patient history, and epidemiological information. The expected result is Negative. Fact Sheet for Patients:  PinkCheek.be Fact Sheet for Healthcare Providers:  GravelBags.it This test is not yet ap proved or cleared by the [place] FDA and  has been authorized for detection and/or diagnosis of SARS-CoV-2 by FDA under an Emergency Use Authorization (EUA). This EUA will remain  in effect (meaning this test can be used) for the duration of the COVID-19 declaration under Section 564(b)(1) of the Act, 21 U.S.C. section 360bbb-3(b)(1), unless the authorization is terminated or revoked sooner.    Influenza A by PCR NEGATIVE NEGATIVE Final   Influenza B by PCR NEGATIVE NEGATIVE Final    Comment: (NOTE) The Xpert Xpress SARS-CoV-2/FLU/RSV assay is intended as an aid in  the diagnosis of influenza from Nasopharyngeal swab specimens and  should not be used as a sole basis for treatment. Nasal washings and  aspirates are unacceptable for Xpert Xpress SARS-CoV-2/FLU/RSV  testing. Fact Sheet for Patients: PinkCheek.be Fact Sheet for Healthcare Providers: GravelBags.it This test is not yet approved or cleared by the [place] FDA and  has been authorized for detection and/or diagnosis of SARS-CoV-2 by  FDA under an Emergency Use Authorization (EUA). This EUA will remain  in effect (meaning this test can be used) for the duration of the  Covid-19 declaration under Section 564(b)(1) of the Act, 21  U.S.C. section 360bbb-3(b)(1), unless the authorization is  terminated or revoked. Performed at [facility], [address] [place], [place] [address]   Blood culture (routine x 2)     Status: None   Collection Time: 04/04/20  4:52 AM   Specimen: BLOOD LEFT WRIST  Result Value Ref Range Status   Specimen  Description BLOOD LEFT WRIST  Final   Special Requests   Final    BOTTLES DRAWN AEROBIC AND ANAEROBIC Blood Culture adequate volume   Culture   Final    NO GROWTH 5 DAYS Performed at [facility], [address], [place], [place] [address]    Report Status 04/09/2020 FINAL  Final  Urine culture     Status: None   Collection Time: 04/04/20  6:28 AM   Specimen: Urine, Random  Result Value Ref Range Status   Specimen Description   Final    URINE, RANDOM Performed at [facility], [address], [place], [place] [address]    Special Requests   Final    NONE Performed at [facility], [address] [place], [place]  [address]    Culture   Final    NO GROWTH Performed at [facility], [place] [address], [place], [place] [address]    Report Status 04/05/2020 FINAL  Final    Coagulation Studies: No results for input(s): LABPROT, INR in the last 72 hours.  Urinalysis: No results for input(s): COLORURINE, LABSPEC, PHURINE, GLUCOSEU, HGBUR, BILIRUBINUR, KETONESUR, PROTEINUR, UROBILINOGEN, NITRITE, LEUKOCYTESUR in the last 72 hours.  Invalid input(s): APPERANCEUR    Imaging: DG Chest Port 1 View  Result Date: 04/13/2020 CLINICAL DATA:  Is seen follow-up chest x-ray, bilateral pleural effusions and pulmonary edema. CS EXAM: PORTABLE CHEST 1 VIEW COMPARISON:  Chest x-rays dated 04/10/2020 and 04/04/2020. FINDINGS: Stable cardiomegaly. Slightly improved aeration bilaterally suggesting improved fluid status. Probable small bilateral pleural effusions and/or bibasilar atelectasis. No pneumothorax is seen. Osseous structures about the chest are unremarkable. IMPRESSION: 1. Slightly improved aeration bilaterally suggesting improved fluid status. 2. Probable small bilateral pleural effusions and/or bibasilar atelectasis. No evidence of pneumonia on today's exam. 3. Stable cardiomegaly. Electronically Signed   By: Franki Cabot M.D.   On: 04/13/2020 08:46      Medications:   . sodium chloride    . iron sucrose 200 mg (04/14/20 1208)   . amLODipine  5 mg Oral Daily  . clopidogrel  75 mg Oral Daily  . enoxaparin (LOVENOX) injection  30 mg Subcutaneous Q24H  . folic acid  1 mg Oral Daily  . gabapentin  100 mg Oral BID  . hydrALAZINE  50 mg Oral BID  . insulin aspart  0-15 Units Subcutaneous TID WC  . insulin aspart  0-5 Units Subcutaneous QHS  . insulin glargine  20 Units Subcutaneous QHS  . ipratropium-albuterol  3 mL Nebulization TID  . isosorbide mononitrate  30 mg Oral Daily  . metoprolol  200 mg Oral Daily  . pantoprazole  40 mg Oral Daily  . polyethylene glycol  17 g Oral Daily  . rosuvastatin  10 mg Oral Daily  . sodium chloride flush  3 mL Intravenous Q12H  . tamsulosin  0.4 mg Oral QPC supper   sodium chloride, acetaminophen, albuterol, dextromethorphan-guaiFENesin, hydrALAZINE, ondansetron (ZOFRAN) IV, sodium chloride flush  Assessment/ Plan:  Mr. HABEEB PUERTAS is a 85 y.o. white male with B-cell lymphoma, aortic regurgitation, CAD, CHF, diabetes mellitus type 2, COPD, hypertension, iron deficiency anemia, peptic ulcer disease, peripheral vascular disease who was admitted to [facility] on 04/04/2020 for Acute pulmonary edema ([place]) [J81.0] Acute respiratory failure with hypoxia ([place]) [J96.01] CHF, acute on chronic (HCC) [I50.9] Acute on chronic diastolic CHF (congestive heart failure) ([place]) [I50.33] Community acquired pneumonia, unspecified laterality [J18.9]  1. Acute renal failure on chronic kidney disease stage IIIB: baseline creatinine of 1.79, GFR of 35 on 12/05/19.  Chronic Kidney disease secondary to diabetes Acute renal failure secondary to ATN Creatinine is trending up. No acute indication for dialysis.   2. Hypertension: 125/61. Current regimen of tamsulosin, metoprolol, isosorbide mononitrate, hydralazine and amlodipine.  Holding furosemide.   3. Anemia with chronic kidney disease: normocytic. Hemoglobin 7.3.   Not currently a candidate for ESA with malignancy.   Will need outpatient follow up with nephrology.    LOS: 10 Sarath Kolluru 5/17/202112:35 PM

## 2020-04-14 NOTE — Progress Notes (Addendum)
[facility]  PROGRESS NOTE  Brett [name] NTZ:[id number] DOB: [birth year] DOA: 04/04/2020 PCP: Perrin Maltese, MD Admit date - 04/04/2020   Admitting Physician Ivor Costa, MD  Outpatient Primary MD for the patient is Perrin Maltese, MD  LOS - 10 Brief Narrative   Brett [name] is a 85 y.o. year old male with medical history significant for  B-cell lymphoma, aortic regurgitation, CAD, CHF, diabetes mellitus type 2, COPD on 2 L, hypertension, iron deficiency anemia, peptic ulcer disease, peripheral vascular disease who presented on 04/04/2020 with shortness of breath, lower extremity edema, worsening hypoxia and was found to have acute on chronic hypoxic respiratory failure secondary to pulmonary edema related to CHF exacerbation and concern for pneumonia. In work-up Legionella, strep pneumo, blood cultures, Covid testing all unremarkable. BNP elevated at 591 on admission with signs of pulmonary edema on chest x-ray consistent with CHF, some concern for potential infection with right airspace patchy disease, procalcitonin was less than 0.10. Patient started on vancomycin, cefepime in ED as well as IV Solu-Medrol, duo nebs for COPD as well as IV Lasix and BiPAP for CHF exacerbation.   Hospital course complicated by AKI in setting of IV Lasix for CHF exacerbation  Subjective  Today really wants to be able to go home.  Denies any shortness of breath, no cough  A & P   Acute on chronic diastolic CHF, stable. Net negative almost 7L this admission, back on home O2 regimen of 2 L, repeat chest x-ray shows slight improvement in pleural effusion/edema, no overt hypervolemia on exam. EF preserved with 50 to 67% grade 2 diastolic dysfunction -Holding Lasix in the setting of AKI  (PTA dose 20 mg daily) -Continue to monitor ins and outs -Daily weights  Acute on chronic hypoxic respiratory failure, resolved. Pulmonary edema with bilateral effusions on chest x-ray (5/13), effusion is too small to  warrant thoracentesis. Currently holding off on IV Lasix given AKI. Completed 5-day course of ceftriaxone and 3-day course of azithromycin for CAP as well as a course of prednisone for potential COPD flare. Now back to home O2 of 2 L -monitor O2  AKI on CKD stage III related to diabetes.  With creatinine at 3 of up from 2.78. AKI related to ATN, creatinine 6, did have lower blood pressures overnight. No hydronephrosis on renal ultrasound. May have new baseline -Continue to monitor BMP-decrease hydralazine dosing, repeat BMP in a.m. -Nephrology following, will need outpatient follow-up -Avoid nephrotoxins  Hypertension, blood pressures running low with systolics in the low 591M despite decrease in hydralazine from 100 mg twice daily to 50 mg twice daily today -home regimen includes: Tamsulosin, Toprol, Imdur,  amlodipine, hydralazine -hold hydralazine (had one dose this am) and closely monitor BP  Acute on chronic anemia of CKD. Iron panel consistent with iron deficiency anemia. Previous baseline hemoglobin of 9.8, around 7.2-7.8 currently. No current signs or symptoms of bleeding -Continue to monitor for IV daily for five total days, follow-up with vitamin C/oral supplementation on discharge -Transfuse if hemoglobin less than seven not currently candidate for ESA with malignancy (B-cell lymphoma)  History of CAD/CVA. Asymptomatic. -Continue Plavix, Crestor, Imdur  Type 2 diabetes -Lantus 20 units,, stable monitor CBGs, sliding scale as needed  COPD, no current wheezing, stable -Continue to wean O2 back to home regimen -DuoNebs 3 times daily  GERD, stable  --continue Protonix  Chronic atrial fibrillation, rate controlled. Not on anticoagulation with history of previous GI bleed. Amiodarone since hospitalization on 04/2019 with  new onset AFIB at the time.  --home amiodarone Has been on hold since admission --seen by Dr. Neoma Laming last hospital stay in 04/2019, will ensure outpatient  follow up prior to discharge -Continue metoprolol  Hyperlipidemia, stable continue statin  BPH-continue Flomax -Monitor bladder scans  Goals of care discussion. Palliative care consulted, unable to reach his familial support -Home with home health with stable, outpatient palliative services to follow which patient agreeable with         Family Communication  : called Claudette Head at [phone] no answer and unable to leave voice mail Code Status : DNR  Disposition Plan  :  Patient is from home. Anticipated d/c date: 24- 48 hours. Barriers to d/c or necessity for inpatient status:  Continued improvement in kidney function, stable respiratory status/volume status off diuresis, stability of blood pressure Consults  : Nephrology  Procedures  : TTE, 04/05/2020 EF 10-25%, grade 2 diastolic dysfunction  DVT Prophylaxis  :  Lovenox   Lab Results  Component Value Date   PLT 158 04/14/2020    Diet :  Diet Order            Diet 2 gram sodium Room service appropriate? Yes; Fluid consistency: Thin; Fluid restriction: 1500 mL Fluid  Diet effective now               Inpatient Medications Scheduled Meds: . amLODipine  5 mg Oral Daily  . clopidogrel  75 mg Oral Daily  . enoxaparin (LOVENOX) injection  30 mg Subcutaneous Q24H  . folic acid  1 mg Oral Daily  . gabapentin  100 mg Oral BID  . hydrALAZINE  50 mg Oral BID  . insulin aspart  0-15 Units Subcutaneous TID WC  . insulin aspart  0-5 Units Subcutaneous QHS  . insulin glargine  20 Units Subcutaneous QHS  . ipratropium-albuterol  3 mL Nebulization TID  . isosorbide mononitrate  30 mg Oral Daily  . metoprolol  200 mg Oral Daily  . pantoprazole  40 mg Oral Daily  . polyethylene glycol  17 g Oral Daily  . rosuvastatin  10 mg Oral Daily  . sodium chloride flush  3 mL Intravenous Q12H  . tamsulosin  0.4 mg Oral QPC supper   Continuous Infusions: . sodium chloride    . iron sucrose 200 mg (04/14/20 1208)   PRN  Meds:.sodium chloride, acetaminophen, albuterol, dextromethorphan-guaiFENesin, hydrALAZINE, ondansetron (ZOFRAN) IV, sodium chloride flush  Antibiotics  :   Anti-infectives (From admission, onward)   Start     Dose/Rate Route Frequency Ordered Stop   04/05/20 1000  azithromycin (ZITHROMAX) 500 mg in sodium chloride 0.9 % 250 mL IVPB     500 mg 250 mL/hr over 60 Minutes Intravenous Every 24 hours 04/04/20 0844 04/06/20 1232   04/05/20 1000  cefTRIAXone (ROCEPHIN) 1 g in sodium chloride 0.9 % 100 mL IVPB     1 g 200 mL/hr over 30 Minutes Intravenous Every 24 hours 04/04/20 0844 04/08/20 0836   04/04/20 0645  vancomycin (VANCOCIN) IVPB 1000 mg/200 mL premix     1,000 mg 200 mL/hr over 60 Minutes Intravenous  Once 04/04/20 0639 04/04/20 0905   04/04/20 0600  vancomycin (VANCOCIN) IVPB 1000 mg/200 mL premix     1,000 mg 200 mL/hr over 60 Minutes Intravenous  Once 04/04/20 0556 04/04/20 0753   04/04/20 0600  ceFEPIme (MAXIPIME) 2 g in sodium chloride 0.9 % 100 mL IVPB     2 g 200 mL/hr over 30 Minutes  Intravenous  Once 04/04/20 0556 04/04/20 0706   04/04/20 0600  azithromycin (ZITHROMAX) 500 mg in sodium chloride 0.9 % 250 mL IVPB     500 mg 250 mL/hr over 60 Minutes Intravenous  Once 04/04/20 0556 04/04/20 0753       Objective   Vitals:   04/13/20 2220 04/14/20 0030 04/14/20 0741 04/14/20 1653  BP: 109/65 (!) 105/57 125/61 (!) 105/57  Pulse: 90 89 92 83  Resp: 16 18    Temp: 97.9 F (36.6 C) 97.9 F (36.6 C) 97.6 F (36.4 C) 98.1 F (36.7 C)  TempSrc:  Oral Oral Oral  SpO2: 97% 93% 98% 95%  Weight:      Height:        SpO2: 95 % O2 Flow Rate (L/min): 2 L/min FiO2 (%): 40 %  Wt Readings from Last 3 Encounters:  04/13/20 83.6 kg  12/04/19 89.3 kg  07/14/19 90.7 kg     Intake/Output Summary (Last 24 hours) at 04/14/2020 1815 Last data filed at 04/14/2020 1359 Gross per 24 hour  Intake 720 ml  Output 2100 ml  Net -1380 ml    Physical Exam:     Awake Alert,  Oriented X 3, Normal affect Very hard of hearing No new F.N deficits,  Echo.AT, Normal respiratory effort on on 2L, no appreciable crackles or rhonchi RRR +ve B.Sounds, Abd Soft, No tenderness, No rebound, guarding or rigidity. No Cyanosis, No new Rash or bruise     I have personally reviewed the following:   Data Reviewed:  CBC Recent Labs  Lab 04/10/20 0418 04/11/20 0504 04/12/20 0510 04/13/20 0717 04/14/20 0401  WBC 10.0 8.8 7.6 6.4 5.7  HGB 8.1* 8.4* 7.6* 7.6* 7.3*  HCT 25.4* 26.0* 23.4* 24.0* 23.3*  PLT 143* 131* 146* 166 158  MCV 86.7 85.8 85.4 85.7 86.6  MCH 27.6 27.7 27.7 27.1 27.1  MCHC 31.9 32.3 32.5 31.7 31.3  RDW 15.3 15.6* 15.6* 15.5 15.6*    Chemistries  Recent Labs  Lab 04/10/20 0418 04/11/20 0504 04/12/20 0510 04/13/20 0717 04/14/20 0401  NA 146* 143 140 143 142  K 4.5 4.5 4.3 4.5 4.4  CL 113* 107 103 105 105  CO2 26 27 27 29 27   GLUCOSE 68* 141* 102* 111* 138*  BUN 72* 66* 63* 59* 54*  CREATININE 2.60* 2.83* 2.72* 2.68* 3.02*  CALCIUM 7.1* 7.4* 7.2* 7.5* 7.4*  MG 1.9 2.0 2.0  --   --    ------------------------------------------------------------------------------------------------------------------ No results for input(s): CHOL, HDL, LDLCALC, TRIG, CHOLHDL, LDLDIRECT in the last 72 hours.  Lab Results  Component Value Date   HGBA1C 12.4 (H) 11/27/2019   ------------------------------------------------------------------------------------------------------------------ No results for input(s): TSH, T4TOTAL, T3FREE, THYROIDAB in the last 72 hours.  Invalid input(s): FREET3 ------------------------------------------------------------------------------------------------------------------ No results for input(s): VITAMINB12, FOLATE, FERRITIN, TIBC, IRON, RETICCTPCT in the last 72 hours.  Coagulation profile No results for input(s): INR, PROTIME in the last 168 hours.  No results for input(s): DDIMER in the last 72 hours.  Cardiac  Enzymes No results for input(s): CKMB, TROPONINI, MYOGLOBIN in the last 168 hours.  Invalid input(s): CK ------------------------------------------------------------------------------------------------------------------    Component Value Date/Time   BNP 301.0 (H) 04/10/2020 5102    Micro Results No results found for this or any previous visit (from the past 240 hour(s)).  Radiology Reports [place] CHEST (PLEURAL EFFUSION)  Result Date: 04/11/2020 CLINICAL DATA:  Evaluate for pleural effusions and thoracentesis. EXAM: CHEST ULTRASOUND COMPARISON:  Chest radiograph 04/10/2020 FINDINGS: Both sides of the  chest were evaluated with ultrasound. Trace pleural fluid on both sides of the chest. IMPRESSION: Trace bilateral pleural effusions. Thoracentesis not performed due to the small pleural fluid volumes. Electronically Signed   By: Markus Daft M.D.   On: 04/11/2020 12:51   US RENAL  Result Date: 04/07/2020 CLINICAL DATA:  AKI EXAM: RENAL / URINARY TRACT ULTRASOUND COMPLETE COMPARISON:  None. FINDINGS: Right Kidney: Renal measurements: 10.2 x 5 x 4.5 cm = volume: 120.4 mL . Echogenicity within normal limits. Two small cysts. No solid mass or hydronephrosis visualized. Left Kidney: Renal measurements: 11.1 x 5.8 x 4.7 cm = volume: 155.8 mL. Echogenicity within normal limits. No mass or hydronephrosis visualized. Bladder: Appears normal for degree of bladder distention. Other: None. IMPRESSION: No hydronephrosis. Electronically Signed   By: Macy Mis M.D.   On: 04/07/2020 09:31   DG Chest Port 1 View  Result Date: 04/13/2020 CLINICAL DATA:  Is seen follow-up chest x-ray, bilateral pleural effusions and pulmonary edema. CS EXAM: PORTABLE CHEST 1 VIEW COMPARISON:  Chest x-rays dated 04/10/2020 and 04/04/2020. FINDINGS: Stable cardiomegaly. Slightly improved aeration bilaterally suggesting improved fluid status. Probable small bilateral pleural effusions and/or bibasilar atelectasis. No pneumothorax is  seen. Osseous structures about the chest are unremarkable. IMPRESSION: 1. Slightly improved aeration bilaterally suggesting improved fluid status. 2. Probable small bilateral pleural effusions and/or bibasilar atelectasis. No evidence of pneumonia on today's exam. 3. Stable cardiomegaly. Electronically Signed   By: Franki Cabot M.D.   On: 04/13/2020 08:46   DG Chest Port 1 View  Result Date: 04/10/2020 CLINICAL DATA:  Dyspnea EXAM: PORTABLE CHEST 1 VIEW COMPARISON:  Radiograph 04/04/2020, CT 01/21/2019 FINDINGS: Diffuse interstitial opacities throughout the lungs with more confluent opacity in the right mid to lower lung which is increased from comparison exams. There is indistinctness of the pulmonary vascularity as well as fissural thickening with bilateral pleural effusions. No pneumothorax. Cardiomegaly is similar to prior likely accentuated by abundant mediastinal fat seen on comparison CT. The aorta is calcified. The remaining cardiomediastinal contours are unremarkable. No acute osseous or soft tissue abnormality. Degenerative changes are present in the imaged spine and shoulders. Telemetry leads overlie the chest. Surgical clip at the base of the left neck. IMPRESSION: 1. Findings suggesting CHF with edema and bilateral effusions. More confluent opacity in the right mid to lower lung could reflect alveolar edema or pleural fluid though dense consolidation of infection could have a similar appearance. 2. Stable cardiomegaly. Electronically Signed   By: Lovena Le M.D.   On: 04/10/2020 15:02   DG Chest Port 1 View  Result Date: 04/04/2020 CLINICAL DATA:  Shortness of breath. Lower extremity edema. Hypoxia. EXAM: PORTABLE CHEST 1 VIEW COMPARISON:  One-view chest x-ray 11/30/2019. FINDINGS: Heart is enlarged. Diffuse interstitial pattern is present. Mild patchy opacities are superimposed on the right. Small effusions are suggested. Degenerative changes are noted at the right [facility] joint. IMPRESSION: 1.  Cardiomegaly with diffuse interstitial pattern compatible with edema. 2. Patchy airspace disease on the right compatible with infection. Electronically Signed   By: San Morelle M.D.   On: 04/04/2020 05:08   ECHOCARDIOGRAM COMPLETE  Result Date: 04/05/2020    ECHOCARDIOGRAM REPORT   Patient Name:   KWINTON MAAHS Date of Exam: 04/05/2020 Medical Rec #:  [id number]         Height:       69.0 in Accession #:    [id number]        Weight:  184.1 lb Date of Birth:  1935-04-30         BSA:          1.995 m Patient Age:    66 years          BP:           119/48 mmHg Patient Gender: M                 HR:           61 bpm. Exam Location:  [facility] Procedure: 2D Echo, Cardiac Doppler and Color Doppler Indications:     CHF- acute diastolic 315.94  History:         Patient has prior history of Echocardiogram examinations, most                  recent 11/26/2019. CHF; Risk Factors:Hypertension and Diabetes.                  AI, PVD.  Sonographer:     Sherrie Sport RDCS (AE) Referring Phys:  [id number] Soledad Gerlach NIU Diagnosing Phys: Neoma Laming MD IMPRESSIONS  1. Left ventricular ejection fraction, by estimation, is 50 to 55%. The left ventricle has low normal function. The left ventricle demonstrates global hypokinesis. Left ventricular diastolic parameters are consistent with Grade II diastolic dysfunction (pseudonormalization).  2. Right ventricular systolic function is mildly reduced. The right ventricular size is moderately enlarged. There is moderately elevated pulmonary artery systolic pressure.  3. Left atrial size was mild to moderately dilated.  4. Right atrial size was mild to moderately dilated.  5. The mitral valve is grossly normal. Trivial mitral valve regurgitation. No evidence of mitral stenosis.  6. The aortic valve is abnormal. Aortic valve regurgitation is trivial. Moderate aortic valve stenosis.  7. The inferior vena cava is normal in size with greater than 50% respiratory variability, suggesting right atrial  pressure of 3 mmHg. FINDINGS  Left Ventricle: Left ventricular ejection fraction, by estimation, is 50 to 55%. The left ventricle has low normal function. The left ventricle demonstrates global hypokinesis. The left ventricular internal cavity size was normal in size. There is no left ventricular hypertrophy. Left ventricular diastolic parameters are consistent with Grade II diastolic dysfunction (pseudonormalization). Right Ventricle: The right ventricular size is moderately enlarged. No increase in right ventricular wall thickness. Right ventricular systolic function is mildly reduced. There is moderately elevated pulmonary artery systolic pressure. The tricuspid regurgitant velocity is 3.38 m/s, and with an assumed right atrial pressure of 10 mmHg, the estimated right ventricular systolic pressure is 29.2 mmHg. Left Atrium: Left atrial size was mild to moderately dilated. Right Atrium: Right atrial size was mild to moderately dilated. Pericardium: There is no evidence of pericardial effusion. Mitral Valve: The mitral valve is grossly normal. Normal mobility of the mitral valve leaflets. Severe mitral annular calcification. Trivial mitral valve regurgitation. No evidence of mitral valve stenosis. Tricuspid Valve: The tricuspid valve is normal in structure. Tricuspid valve regurgitation is mild . No evidence of tricuspid stenosis. Aortic Valve: The aortic valve is abnormal. Aortic valve regurgitation is trivial. Moderate aortic stenosis is present. Aortic valve mean gradient measures 18.7 mmHg. Aortic valve peak gradient measures 30.0 mmHg. Aortic valve area, by VTI measures 1.21 cm. Pulmonic Valve: The pulmonic valve was normal in structure. Pulmonic valve regurgitation is trivial. No evidence of pulmonic stenosis. Aorta: The aortic root is normal in size and structure. Venous: The inferior vena cava is normal in size with greater than 50%  respiratory variability, suggesting right atrial pressure of 3 mmHg.  IAS/Shunts: No atrial level shunt detected by color flow Doppler.  LEFT VENTRICLE PLAX 2D LVIDd:         4.37 cm  Diastology LVIDs:         3.14 cm  LV e' lateral:   5.00 cm/s LV PW:         1.41 cm  LV E/e' lateral: 27.4 LV IVS:        0.93 cm  LV e' medial:    5.66 cm/s LVOT diam:     2.20 cm  LV E/e' medial:  24.2 LV SV:         84 LV SV Index:   42 LVOT Area:     3.80 cm  RIGHT VENTRICLE RV Basal diam:  4.46 cm RV S prime:     11.10 cm/s TAPSE (M-mode): 3.4 cm LEFT ATRIUM             Index       RIGHT ATRIUM           Index LA diam:        3.70 cm 1.86 cm/m  RA Area:     24.70 cm LA Vol (A2C):   69.3 ml 34.75 ml/m RA Volume:   83.70 ml  41.96 ml/m LA Vol (A4C):   51.6 ml 25.87 ml/m LA Biplane Vol: 63.5 ml 31.84 ml/m  AORTIC VALVE                    PULMONIC VALVE AV Area (Vmax):    1.32 cm     PV Vmax:        0.79 m/s AV Area (Vmean):   1.21 cm     PV Peak grad:   2.5 mmHg AV Area (VTI):     1.21 cm     RVOT Peak grad: 2 mmHg AV Vmax:           273.67 cm/s AV Vmean:          204.333 cm/s AV VTI:            0.698 m AV Peak Grad:      30.0 mmHg AV Mean Grad:      18.7 mmHg LVOT Vmax:         95.30 cm/s LVOT Vmean:        65.000 cm/s LVOT VTI:          0.222 m LVOT/AV VTI ratio: 0.32  AORTA Ao Root diam: 3.40 cm MITRAL VALVE                TRICUSPID VALVE MV Area (PHT): 2.97 cm     TR Peak grad:   45.7 mmHg MV Decel Time: 255 msec     TR Vmax:        338.00 cm/s MV E velocity: 137.00 cm/s MV A velocity: 102.00 cm/s  SHUNTS MV E/A ratio:  1.34         Systemic VTI:  0.22 m                             Systemic Diam: 2.20 cm Neoma Laming MD Electronically signed by Neoma Laming MD Signature Date/Time: 04/05/2020/6:11:17 PM    Final      Time Spent in minutes  30     Desiree Hane M.D on 04/14/2020 at 6:15 PM  To page go to  [email or web address] - password [facility]

## 2020-04-14 NOTE — Care Management Important Message (Signed)
Important Message  Patient Details  Name: Brett [name] MRN: [id number] Date of Birth: 04-Dec-1934   Medicare Important Message Given:  Yes     Dannette Barbara 04/14/2020, 3:14 PM

## 2020-04-15 DIAGNOSIS — I952 Hypotension due to drugs: Secondary | ICD-10-CM

## 2020-04-15 LAB — BASIC METABOLIC PANEL
Anion gap: 8 (ref 5–15)
BUN: 51 mg/dL — ABNORMAL HIGH (ref 8–23)
CO2: 29 mmol/L (ref 22–32)
Calcium: 7.6 mg/dL — ABNORMAL LOW (ref 8.9–10.3)
Chloride: 106 mmol/L (ref 98–111)
Creatinine, Ser: 2.94 mg/dL — ABNORMAL HIGH (ref 0.61–1.24)
GFR calc Af Amer: 22 mL/min — ABNORMAL LOW (ref >60)
GFR calc non Af Amer: 19 mL/min — ABNORMAL LOW (ref >60)
Glucose, Bld: 109 mg/dL — ABNORMAL HIGH (ref 70–99)
Potassium: 4.6 mmol/L (ref 3.5–5.1)
Sodium: 143 mmol/L (ref 135–145)

## 2020-04-15 LAB — GLUCOSE, CAPILLARY
Glucose-Capillary: 98 mg/dL (ref 70–99)
Glucose-Capillary: 140 mg/dL — ABNORMAL HIGH (ref 70–99)
Glucose-Capillary: 181 mg/dL — ABNORMAL HIGH (ref 70–99)

## 2020-04-15 MED ORDER — TAMSULOSIN HCL 0.4 MG PO CAPS: 0.4000 mg | ORAL_CAPSULE | Freq: Every day | ORAL | 1 refills | Status: DC

## 2020-04-15 MED ORDER — DM-GUAIFENESIN ER 30-600 MG PO TB12: 1.0000 | ORAL_TABLET | Freq: Two times a day (BID) | ORAL | 0 refills | Status: DC | PRN

## 2020-04-15 MED ORDER — INSULIN GLARGINE 100 UNIT/ML ~~LOC~~ SOLN: 15.0000 [IU] | Freq: Every day | SUBCUTANEOUS | 11 refills | Status: DC

## 2020-04-15 MED ORDER — FERROUS SULFATE 325 (65 FE) MG PO TABS: 325.0000 mg | ORAL_TABLET | Freq: Two times a day (BID) | ORAL | 3 refills | Status: DC

## 2020-04-15 MED ORDER — POLYETHYLENE GLYCOL 3350 17 G PO PACK: 17.0000 g | PACK | Freq: Every day | ORAL | 0 refills | Status: AC | PRN

## 2020-04-15 NOTE — TOC Progression Note (Signed)
Transition of Care [facility]) - Progression Note    Patient Details  Name: Brett [name] MRN: [id number] Date of Birth: Nov 09, 1935  [facility] CM/SW Contact  Su Hilt, RN Phone Number: 04/15/2020, 4:29 PM  Clinical Narrative:    Hulen Skains and spoke to [facility] the caregiver and confirmed that he will be at home when the patient arrives via EMS transport    Expected Discharge Plan: [place] Barriers to Discharge: Continued Medical Work up  Expected Discharge Plan and Services Expected Discharge Plan: [place]   Discharge Planning Services: CM Consult   Living arrangements for the past 2 months: Single Family Home Expected Discharge Date: 04/15/20               DME Arranged: N/A             Date HH Agency Contacted: 04/09/20 Time [name]: [phone] Representative spoke with at [place]: [place]) Interventions    Readmission Risk Interventions Readmission Risk Prevention Plan 04/09/2020 12/05/2019  Transportation Screening Complete -  Medication Review [employer] photographer) Referral to Pharmacy -  PCP or Specialist appointment within 3-5 days of discharge Complete Complete  HRI or Home Care Consult Complete (No Data)  SW Recovery Care/Counseling Consult - Complete  Palliative Care Screening Complete Not Applicable  Skilled Nursing Facility Complete Complete  Some recent data might be hidden

## 2020-04-15 NOTE — Progress Notes (Signed)
[facility]  ROUNDING NOTE   Subjective:   Patient has no complaints. Hydralazine discontinued.   Creatinine 2.94 (3.02) (2.68) UOP 1220mL  Objective:  Vital signs in last 24 hours:  Temp:  [98.1 F (36.7 C)-98.4 F (36.9 C)] 98.2 F (36.8 C) (05/18 0805) Pulse Rate:  [80-83] 80 (05/18 0805) Resp:  [18] 18 (05/18 0805) BP: (95-115)/(46-62) 115/62 (05/18 0805) SpO2:  [93 %-97 %] 97 % (05/18 0805) Weight:  [83.3 kg] 83.3 kg (05/18 0616)  Weight change:  Filed Weights   04/12/20 0500 04/13/20 0638 04/15/20 0616  Weight: 83.4 kg 83.6 kg 83.3 kg    Intake/Output: I/O last 3 completed shifts: In: 600 [P.O.:600] Out: 2850 [Urine:2850]   Intake/Output this shift:  Total I/O In: 360 [P.O.:360] Out: 500 [Urine:500]  Physical Exam: General: NAD, laying in bed  Head: +hard of hearing  Eyes: Anicteric, PERRL  Neck: Supple, trachea midline  Lungs:  Clear to auscultation  Heart: Regular rate and rhythm  Abdomen:  Soft, nontender,   Extremities:  no peripheral edema.  Neurologic: Nonfocal, moving all four extremities  Skin: No lesions       Basic Metabolic Panel: Recent Labs  Lab 04/10/20 0418 04/10/20 0418 04/11/20 0504 04/11/20 0504 04/12/20 0510 04/12/20 0510 04/13/20 0717 04/14/20 0401 04/15/20 0430  NA 146*   < > 143  --  140  --  143 142 143  K 4.5   < > 4.5  --  4.3  --  4.5 4.4 4.6  CL 113*   < > 107  --  103  --  105 105 106  CO2 26   < > 27  --  27  --  29 27 29   GLUCOSE 68*   < > 141*  --  102*  --  111* 138* 109*  BUN 72*   < > 66*  --  63*  --  59* 54* 51*  CREATININE 2.60*   < > 2.83*  --  2.72*  --  2.68* 3.02* 2.94*  CALCIUM 7.1*   < > 7.4*   < > 7.2*   < > 7.5* 7.4* 7.6*  MG 1.9  --  2.0  --  2.0  --   --   --   --   PHOS 4.0  --  4.3  --  5.4*  --   --   --   --    < > = values in this interval not displayed.    Liver Function Tests: Recent Labs  Lab 04/10/20 0418  ALBUMIN 2.4*   No results for input(s): LIPASE,  AMYLASE in the last 168 hours. No results for input(s): AMMONIA in the last 168 hours.  CBC: Recent Labs  Lab 04/10/20 0418 04/11/20 0504 04/12/20 0510 04/13/20 0717 04/14/20 0401  WBC 10.0 8.8 7.6 6.4 5.7  HGB 8.1* 8.4* 7.6* 7.6* 7.3*  HCT 25.4* 26.0* 23.4* 24.0* 23.3*  MCV 86.7 85.8 85.4 85.7 86.6  PLT 143* 131* 146* 166 158    Cardiac Enzymes: No results for input(s): CKTOTAL, CKMB, CKMBINDEX, TROPONINI in the last 168 hours.  BNP: Invalid input(s): POCBNP  CBG: Recent Labs  Lab 04/14/20 1135 04/14/20 1655 04/14/20 2115 04/15/20 0806 04/15/20 1152  GLUCAP 187* 140* 117* 62 140*    Microbiology: Results for orders placed or performed during the hospital encounter of 04/04/20  Blood culture (routine x 2)     Status: None   Collection Time: 04/04/20  4:47 AM  Specimen: Right Antecubital; Blood  Result Value Ref Range Status   Specimen Description RIGHT ANTECUBITAL  Final   Special Requests   Final    BOTTLES DRAWN AEROBIC AND ANAEROBIC Blood Culture adequate volume   Culture   Final    NO GROWTH 5 DAYS Performed at [facility], [place], [address] [place], [place] [address]    Report Status 04/09/2020 FINAL  Final  Respiratory Panel by RT PCR (Flu A&B, Covid) - Nasopharyngeal Swab     Status: None   Collection Time: 04/04/20  4:48 AM   Specimen: Nasopharyngeal Swab  Result Value Ref Range Status   SARS Coronavirus 2 by RT PCR NEGATIVE NEGATIVE Final    Comment: (NOTE) SARS-CoV-2 target nucleic acids are NOT DETECTED. The SARS-CoV-2 RNA is generally detectable in upper respiratoy specimens during the acute phase of infection. The lowest concentration of SARS-CoV-2 viral copies this assay can detect is 131 copies/mL. A negative result does not preclude SARS-Cov-2 infection and should not be used as the sole basis for treatment or other patient management decisions. A negative result may occur with  improper specimen collection/handling, submission  of specimen other than nasopharyngeal swab, presence of viral mutation(s) within the areas targeted by this assay, and inadequate number of viral copies (<131 copies/mL). A negative result must be combined with clinical observations, patient history, and epidemiological information. The expected result is Negative. Fact Sheet for Patients:  PinkCheek.be Fact Sheet for Healthcare Providers:  GravelBags.it This test is not yet ap proved or cleared by the [place] FDA and  has been authorized for detection and/or diagnosis of SARS-CoV-2 by FDA under an Emergency Use Authorization (EUA). This EUA will remain  in effect (meaning this test can be used) for the duration of the COVID-19 declaration under Section 564(b)(1) of the Act, 21 U.S.C. section 360bbb-3(b)(1), unless the authorization is terminated or revoked sooner.    Influenza A by PCR NEGATIVE NEGATIVE Final   Influenza B by PCR NEGATIVE NEGATIVE Final    Comment: (NOTE) The Xpert Xpress SARS-CoV-2/FLU/RSV assay is intended as an aid in  the diagnosis of influenza from Nasopharyngeal swab specimens and  should not be used as a sole basis for treatment. Nasal washings and  aspirates are unacceptable for Xpert Xpress SARS-CoV-2/FLU/RSV  testing. Fact Sheet for Patients: PinkCheek.be Fact Sheet for Healthcare Providers: GravelBags.it This test is not yet approved or cleared by the [place] FDA and  has been authorized for detection and/or diagnosis of SARS-CoV-2 by  FDA under an Emergency Use Authorization (EUA). This EUA will remain  in effect (meaning this test can be used) for the duration of the  Covid-19 declaration under Section 564(b)(1) of the Act, 21  U.S.C. section 360bbb-3(b)(1), unless the authorization is  terminated or revoked. Performed at [facility], [address]  [place], [place] [address]   Blood culture (routine x 2)     Status: None   Collection Time: 04/04/20  4:52 AM   Specimen: BLOOD LEFT WRIST  Result Value Ref Range Status   Specimen Description BLOOD LEFT WRIST  Final   Special Requests   Final    BOTTLES DRAWN AEROBIC AND ANAEROBIC Blood Culture adequate volume   Culture   Final    NO GROWTH 5 DAYS Performed at [facility], [address] [place], [place] [address]    Report Status 04/09/2020 FINAL  Final  Urine culture     Status: None   Collection Time: 04/04/20  6:28 AM   Specimen: Urine, Random  Result Value Ref Range Status   Specimen Description   Final    URINE, RANDOM Performed at [facility], [address], [place], [place] [address]    Special Requests   Final    NONE Performed at [facility], [address], [place], [place] [address]    Culture   Final    NO GROWTH Performed at [facility], [place] [address], [place], [place] [address]    Report Status 04/05/2020 FINAL  Final    Coagulation Studies: No results for input(s): LABPROT, INR in the last 72 hours.  Urinalysis: No results for input(s): COLORURINE, LABSPEC, PHURINE, GLUCOSEU, HGBUR, BILIRUBINUR, KETONESUR, PROTEINUR, UROBILINOGEN, NITRITE, LEUKOCYTESUR in the last 72 hours.  Invalid input(s): APPERANCEUR    Imaging: No results found.   Medications:   . sodium chloride    . iron sucrose 200 mg (04/14/20 1208)   . amLODipine  5 mg Oral Daily  . clopidogrel  75 mg Oral Daily  . enoxaparin (LOVENOX) injection  30 mg Subcutaneous Q24H  . folic acid  1 mg Oral Daily  . gabapentin  100 mg Oral BID  . insulin aspart  0-15 Units Subcutaneous TID WC  . insulin aspart  0-5 Units Subcutaneous QHS  . insulin glargine  20 Units Subcutaneous QHS  . ipratropium-albuterol  3 mL Nebulization TID  . isosorbide mononitrate  30 mg Oral Daily  . metoprolol  200 mg Oral Daily  . pantoprazole  40 mg Oral Daily  .  polyethylene glycol  17 g Oral Daily  . rosuvastatin  10 mg Oral Daily  . sodium chloride flush  3 mL Intravenous Q12H  . tamsulosin  0.4 mg Oral QPC supper   sodium chloride, acetaminophen, albuterol, dextromethorphan-guaiFENesin, hydrALAZINE, ondansetron (ZOFRAN) IV, sodium chloride flush  Assessment/ Plan:  Mr. BRANDIN STETZER is a 85 y.o. white male with B-cell lymphoma, aortic regurgitation, CAD, CHF, diabetes mellitus type 2, COPD, hypertension, iron deficiency anemia, peptic ulcer disease, peripheral vascular disease who was admitted to [facility] on 04/04/2020 for Acute pulmonary edema ([place]) [J81.0] Acute respiratory failure with hypoxia ([place]) [J96.01] CHF, acute on chronic (HCC) [I50.9] Acute on chronic diastolic CHF (congestive heart failure) ([place]) [I50.33] Community acquired pneumonia, unspecified laterality [J18.9]  1. Acute renal failure on chronic kidney disease stage IIIB: baseline creatinine of 1.79, GFR of 35 on 12/05/19.  Chronic Kidney disease secondary to diabetes Acute renal failure secondary to ATN No acute indication for dialysis.   2. Hypertension: 115/62. Current regimen of tamsulosin, metoprolol, isosorbide mononitrate, and amlodipine.  Holding furosemide and hydralazine.   3. Anemia with chronic kidney disease: normocytic. Hemoglobin 7.3.  Not currently a candidate for ESA with malignancy.   Will need outpatient follow up with nephrology.    LOS: [place] 5/18/202112:53 PM

## 2020-04-15 NOTE — Progress Notes (Signed)
Pt was discharged home, transported by EMS to home, personal care giver waiting at home for pt. Iv discontinued with out difficulties. Pt was escorted on 3 L of O2. No distress noted. Continue to monitor.

## 2020-04-15 NOTE — Discharge Instructions (Signed)
Do not take your water pill Lasix until seen by your regular doctor on checkup Do not take your blood pressure medication hydralazine until seen by your cardiologist Do not take your heart rhythm medication amiodarone until seen by a cardiologist  New medications Lantus 15 units at bedtime

## 2020-04-15 NOTE — Discharge Summary (Signed)
Brett [name] HEN:[id number] DOB: [birth year] DOA: 04/04/2020  PCP: Perrin Maltese, MD  Admit date: 04/04/2020 Discharge date: 04/15/2020  Admitted From: home Disposition:  home  Recommendations for Outpatient Follow-up:  1. Follow up with PCP in 1-2 weeks 2. Discontinued hydralazine, Lasix. New medication Lantus 15 units 3. Outpatient palliative services to follow  4. Outpatient follow up Dr. Earlyne Iba Khan(Cardiology) arranged for 04/18/20  5. Follow-up with Dr. Joesph Fillers) in 1 week (monitor BMP/creatinine)   6. Please follow up on the following pending results:  Home Health:PT/OT Equipment/Devices: 2 L O2 ( home)  Discharge Condition:Stable  CODE STATUS:DNR  Brief/Interim Summary: History of present illness:  Brett [name] is a 85 y.o. year old male with medical history significant for B-cell lymphoma, aortic regurgitation, CAD, CHF, diabetes mellitus type 2, COPD on 2 L, hypertension, iron deficiency anemia, peptic ulcer disease, peripheral vascular disease who presented on 04/04/2020 with shortness of breath, lower extremity edema, worsening hypoxia and was found to have acute on chronic hypoxic respiratory failure secondary to pulmonary edema related to CHF exacerbation and concern for pneumonia. In work-up Legionella, strep pneumo, blood cultures, Covid testing all unremarkable. BNP elevated at 591 on admission with signs of pulmonary edema on chest x-ray consistent with CHF, some concern for potential infection with right airspace patchy disease, procalcitonin was less than 0.10. Patient started on vancomycin, cefepime in ED as well as IV Solu-Medrol, duo nebs for COPD as well as IV Lasix and BiPAP for CHF exacerbation.   Hospital course complicated by AKI in setting of IV Lasix for CHF exacerbationRemaining hospital course addressed in problem based format below:   Hospital Course:   Acute on chronic diastolic CHF, stable. Net negative almost 7L this admission, back on  home O2 regimen of 2 L, repeat chest x-ray shows slight improvement in pleural effusion/edema, no overt hypervolemia on exam. EF preserved with 50 to 36% grade 2 diastolic dysfunction. Dry weight 183 lbs.  -Holding Lasix in the setting of AKI  (PTA dose 20 mg daily), has follow up with cardiology arranged for 04/18/20  Acute on chronic hypoxic respiratory failure, resolved. Pulmonary edema with bilateral effusions on chest x-ray (5/13), effusion is too small to warrant thoracentesis. Currently holding off on IV Lasix given AKI. Completed 5-day course of ceftriaxone and 3-day course of azithromycin for CAP as well as a course of prednisone for potential COPD flare. Now back to home O2 of 2 L -monitor O2  AKI on CKD stage III related to diabetes.  Peak creatinine of 3.33 on hospitalization ( PTA baseline was 1.7-2.1) .AKI related to ATN related to diuresis from CHF and some hypotension episodes addressed more below,  No hydronephrosis on renal ultrasound. May have new baseline per nephrology evaluation. Creatinine on discharge 2.94 -Hold off on using lasix on discharge -Nephrology outpatient follow-up with Dr. Holley Raring in 1 week for [facility] check -Avoid nephrotoxins  Hypertension.  Had to discontinue previous home regimen of hydralazine due to episodes of hypotension with SBP in 90s.  Pressure improved to 110s with discontinuation of hydralazine -Can continue home regimen includes: Toprol, Imdur,  amlodipine,  -Hydralazine discontinued  Acute on chronic anemia of CKD. Iron panel consistent with iron deficiency anemia. Previous baseline hemoglobin of 9.8, around 7.2-7.8 currently.  Had no signs or symptoms of bleeding throughout hospital stay.  Completed 5-day course of IV Venofer -Continue oral iron on discharge - vitamin C on discharge -not currently candidate for ESA with malignancy (B-cell lymphoma)  History of  CAD/CVA. Asymptomatic. -Continue Plavix, Crestor, Imdur  Type 2 diabetes, A1c 12.4  (10/2019). Had hypoglycemic episodes in the setting of steroids for COPD exacerbation. Blood sugar well controlled in hospital on Lantus. Caregiver received insulin administration instructions prior to discharge -Continue Lantus at 15 units (reduced dose) -Continue home glimepiride  COPD, no current wheezing, stable -Back on home regimen of 2 L O2 -DuoNebs as needed -Continue Dulera/Spiriva  GERD, stable  --continue Protonix  Chronic atrial fibrillation, rate controlled. Not on anticoagulation with history of previous GI bleed. Started on Amiodarone since hospitalization on 04/2019 with new onset AFIB at the time. Home amiodarone Has been on hold throughout hospitalization and has remained stable --outpatient follow up Dr. Neoma Laming arranged for 04/18/20  -Continue metoprolol  Hyperlipidemia, stable  -Continue statin  BPH -continue Flomax   Goals of care discussion. Palliative care consulted during hospital stay -Outpatient palliative services to follow which patient agreeable with   Consultations:  Nephrology  Procedures/Studies: TTE, 04/05/2020 EF 35-57%, grade 2 diastolic dysfunction Subjective: Feels well.  No cough.  No shortness of breath.  No chest pain.  Ready to go home. Discharge Exam: Vitals:   04/15/20 1534 04/15/20 2038  BP: (!) 116/57 116/61  Pulse: 90 85  Resp: 18 19  Temp: 98.1 F (36.7 C) 98.6 F (37 C)  SpO2: 95% 96%   Vitals:   04/15/20 0616 04/15/20 0805 04/15/20 1534 04/15/20 2038  BP:  115/62 (!) 116/57 116/61  Pulse:  80 90 85  Resp:  18 18 19   Temp:  98.2 F (36.8 C) 98.1 F (36.7 C) 98.6 F (37 C)  TempSrc:    Oral  SpO2:  97% 95% 96%  Weight: 83.3 kg     Height:        Awake Alert, Oriented X 3, Normal affect Very hard of hearing No new F.N deficits,  [place].AT, Normal respiratory effort on on 2L, no appreciable crackles or rhonchi RRR +ve B.Sounds, Abd Soft, No tenderness, No rebound, guarding or rigidity. No Cyanosis, No  new Rash or bruise    Discharge Diagnoses:  Principal Problem:   Acute on chronic respiratory failure with hypoxemia (HCC) Active Problems:   HTN (hypertension)   CAD (coronary artery disease)   Stroke (HCC)   CAP (community acquired pneumonia)   COPD exacerbation (HCC)   Atrial fibrillation, chronic (HCC)   CHF, acute on chronic (HCC)   CKD (chronic kidney disease), stage IIIb   Type II diabetes mellitus with renal manifestations (HCC)   HLD (hyperlipidemia)   Acute on chronic diastolic CHF (congestive heart failure) (HCC)   Iron deficiency anemia   AKI (acute kidney injury) ([place])   Goals of care, counseling/discussion   Palliative care by specialist   Hypotension    Discharge Instructions  Discharge Instructions    Diet - low sodium heart healthy   Complete by: As directed    Increase activity slowly   Complete by: As directed      Allergies as of 04/15/2020   No Known Allergies     Medication List    STOP taking these medications   amiodarone 200 MG tablet Commonly known as: PACERONE   ammonium lactate 12 % lotion Commonly known as: LAC-HYDRIN   furosemide 20 MG tablet Commonly known as: LASIX   hydrALAZINE 100 MG tablet Commonly known as: APRESOLINE   terbinafine 1 % cream Commonly known as: LAMISIL     TAKE these medications   albuterol 108 (90 Base) MCG/ACT inhaler  Commonly known as: VENTOLIN HFA Inhale 2 puffs into the lungs every 6 (six) hours as needed for wheezing or shortness of breath. Notes to patient: Not given in hospital   amLODipine 5 MG tablet Commonly known as: NORVASC Take 5 mg by mouth daily.   clopidogrel 75 MG tablet Commonly known as: PLAVIX Take 75 mg by mouth daily.   dextromethorphan-guaiFENesin 30-600 MG 12hr tablet Commonly known as: MUCINEX DM Take 1 tablet by mouth 2 (two) times daily as needed for cough.   ferrous sulfate 325 (65 FE) MG tablet Take 1 tablet (325 mg total) by mouth 2 (two) times daily with a  meal.   gabapentin 100 MG capsule Commonly known as: NEURONTIN Take 1 capsule by mouth 2 (two) times daily.   glimepiride 2 MG tablet Commonly known as: AMARYL Take 1 tablet (2 mg total) by mouth daily with breakfast. What changed:   how much to take  when to take this Notes to patient: Not given in hospital   insulin glargine 100 UNIT/ML injection Commonly known as: LANTUS Inject 0.15 mLs (15 Units total) into the skin at bedtime.   ipratropium-albuterol 0.5-2.5 (3) MG/3ML Soln Commonly known as: DUONEB J44.1  68ml nebulizer every six hours as needed for shortness of breath What changed:   how much to take  how to take this  when to take this  reasons to take this  additional instructions   isosorbide mononitrate 30 MG 24 hr tablet Commonly known as: IMDUR Take 30 mg by mouth daily.   metoprolol 200 MG 24 hr tablet Commonly known as: TOPROL-XL Take 200 mg by mouth daily. What changed: Another medication with the same name was removed. Continue taking this medication, and follow the directions you see here.   mometasone-formoterol 100-5 MCG/ACT Aero Commonly known as: DULERA Inhale 2 puffs into the lungs 2 (two) times daily.   pantoprazole 40 MG tablet Commonly known as: PROTONIX Take 40 mg by mouth daily.   polyethylene glycol 17 g packet Commonly known as: MIRALAX / GLYCOLAX Take 17 g by mouth daily as needed.   rosuvastatin 40 MG tablet Commonly known as: CRESTOR Take 40 mg by mouth daily.   tamsulosin 0.4 MG Caps capsule Commonly known as: FLOMAX Take 1 capsule (0.4 mg total) by mouth daily after supper.   tiotropium 18 MCG inhalation capsule Commonly known as: Spiriva HandiHaler Place 1 capsule (18 mcg total) into inhaler and inhale daily. Notes to patient: Not given in hospital   Vitamin D (Ergocalciferol) 1.25 MG (50000 UNIT) Caps capsule Commonly known as: DRISDOL Take 50,000 Units by mouth once a week. Notes to patient: Not given in  hospital      [place] Follow up on 04/18/2020.   Specialty: Cardiology Why: at 1:30pm. Enter through the [place] entrance Contact information: [address] [place], [place], [place]. Schedule an appointment as soon as possible for a visit in 1 week(s).   Specialty: Nephrology Contact information: [place] [address] [phone]          No Known Allergies      The results of significant diagnostics from this hospitalization (including imaging, microbiology, ancillary and laboratory) are listed below for reference.     Microbiology: No results found for this or any previous visit (from the past 240 hour(s)).  Labs: BNP (last 3 results) Recent Labs    07/14/19 0756 04/04/20 0446 04/10/20 0418  BNP 190.0* 591.0* 553.7*   Basic Metabolic Panel: Recent Labs  Lab 04/10/20 0418 04/10/20 0418 04/11/20 0504 04/12/20 0510 04/13/20 0717 04/14/20 0401 04/15/20 0430  NA 146*   < > 143 140 143 142 143  K 4.5   < > 4.5 4.3 4.5 4.4 4.6  CL 113*   < > 107 103 105 105 106  CO2 26   < > 27 27 29 27 29   GLUCOSE 68*   < > 141* 102* 111* 138* 109*  BUN 72*   < > 66* 63* 59* 54* 51*  CREATININE 2.60*   < > 2.83* 2.72* 2.68* 3.02* 2.94*  CALCIUM 7.1*   < > 7.4* 7.2* 7.5* 7.4* 7.6*  MG 1.9  --  2.0 2.0  --   --   --   PHOS 4.0  --  4.3 5.4*  --   --   --    < > = values in this interval not displayed.   Liver Function Tests: Recent Labs  Lab 04/10/20 0418  ALBUMIN 2.4*   No results for input(s): LIPASE, AMYLASE in the last 168 hours. No results for input(s): AMMONIA in the last 168 hours. CBC: Recent Labs  Lab 04/10/20 0418 04/11/20 0504 04/12/20 0510 04/13/20 0717 04/14/20 0401  WBC 10.0 8.8 7.6 6.4 5.7  HGB 8.1* 8.4* 7.6* 7.6* 7.3*  HCT 25.4* 26.0* 23.4* 24.0* 23.3*  MCV 86.7 85.8 85.4 85.7  86.6  PLT 143* 131* 146* 166 158   Cardiac Enzymes: No results for input(s): CKTOTAL, CKMB, CKMBINDEX, TROPONINI in the last 168 hours. BNP: Invalid input(s): POCBNP CBG: Recent Labs  Lab 04/14/20 1655 04/14/20 2115 04/15/20 0806 04/15/20 1152 04/15/20 1653  GLUCAP 140* 117* 98 140* 181*   D-Dimer No results for input(s): DDIMER in the last 72 hours. Hgb A1c No results for input(s): HGBA1C in the last 72 hours. Lipid Profile No results for input(s): CHOL, HDL, LDLCALC, TRIG, CHOLHDL, LDLDIRECT in the last 72 hours. Thyroid function studies No results for input(s): TSH, T4TOTAL, T3FREE, THYROIDAB in the last 72 hours.  Invalid input(s): FREET3 Anemia work up No results for input(s): VITAMINB12, FOLATE, FERRITIN, TIBC, IRON, RETICCTPCT in the last 72 hours. Urinalysis    Component Value Date/Time   COLORURINE YELLOW (A) 04/04/2020 0628   APPEARANCEUR CLEAR (A) 04/04/2020 0628   LABSPEC 1.011 04/04/2020 0628   PHURINE 6.0 04/04/2020 0628   GLUCOSEU 50 (A) 04/04/2020 0628   HGBUR NEGATIVE 04/04/2020 0628   BILIRUBINUR NEGATIVE 04/04/2020 0628   KETONESUR NEGATIVE 04/04/2020 0628   PROTEINUR 100 (A) 04/04/2020 0628   NITRITE NEGATIVE 04/04/2020 0628   LEUKOCYTESUR NEGATIVE 04/04/2020 0628   Sepsis Labs Invalid input(s): PROCALCITONIN,  WBC,  LACTICIDVEN Microbiology No results found for this or any previous visit (from the past 240 hour(s)).   Time coordinating discharge: Over 30 minutes  SIGNED:   Desiree Hane, MD  [facility] 04/15/2020, 11:55 PM Pager   If 7PM-7AM, please contact night-coverage [email or web address] Password [email or web address]

## 2020-04-15 NOTE — Progress Notes (Signed)
Pt's personal belongings and discharge instructions were given to pt. Continue to monitor

## 2020-04-15 NOTE — Progress Notes (Signed)
Called EMS for transport. 

## 2020-04-15 NOTE — Progress Notes (Addendum)
Patient bladder scanned at 2344. Total of 340 mL noted. NP notified at 2350. Ordered not to do a in and out cath just yet.

## 2020-04-15 NOTE — Progress Notes (Signed)
Caregiver [name] called, unable to make it to hospital for insulin education. Gave verbal instructions and p

## 2020-04-16 LAB — HEMOGLOBIN A1C
Hgb A1c MFr Bld: 7.4 % — ABNORMAL HIGH (ref 4.8–5.6)
Mean Plasma Glucose: 166 mg/dL

## 2020-04-17 ENCOUNTER — Telehealth: Payer: Self-pay | Admitting: Family

## 2020-04-17 NOTE — Telephone Encounter (Signed)
Unable to reach patient regarding his follow up [facility] appointment after his recent hospital discharge.    Alyse Low, [place]

## 2020-04-18 NOTE — Progress Notes (Deleted)
Patient ID: Brett [name], male    DOB: 04-03-1935, 85 y.o.   MRN: [id number]  HPI  Brett [name] is a 85 y/o male with a history of PVD, previous tobacco use, Brett, anemia, hyperlipidemia, HTN, DM, CAD, COPD, carotid stenosis and chronic heart failure.  Echo report from 04/05/20 reviewed and showed an EF of 50-55% along with moderately elevated PA pressure, LAE and trivial Brett. Echo was done 07/11/16 which showed an EF of 55% along with mild Brett, moderate TR and PA pressure of 40 mmHg.    Had a cardiac catheterization done on 07/13/16   Mid LAD to Dist LAD lesion, 40 %stenosed.  Prox Cx to Dist Cx lesion, 30 %stenosed.  Mid RCA to Dist RCA lesion, 30 %stenosed.                    Hemodynamic findings consistent with pulmonary hypertension.  Admitted 04/04/20 due to pulmonary edema. Palliative care and nephrology consults obtained. COVID negative. Antibiotics and solu-medrol given. Initially needed IV lasix with transition to oral diuretics. Was placed on bipap & weaned to home O2. Lasix had to be held due to AKI. Given IV Venofer due to anemia.  Discharged after 11 days.  He presents today for a follow-up visit although hasn't been seen since 2018. He presents with a chief complaint of  Past Medical History:  Diagnosis Date  . Anemia   . Aortic regurgitation   . B-cell lymphoma ([place]) 2009   DX AT [facility]  . Bronchitis   . CAD (coronary artery disease)   . Carotid stenosis   . CHF (congestive heart failure) ([place])   . Diabetes mellitus without complication ([place])   . Diabetes mellitus, type 2 ([place])   . Emphysema of lung ([place])   . Essential hypertension   . History of chemotherapy   . Hyperlipidemia   . Hypertension   . IDA (iron deficiency anemia)   . Leg edema   . Meralgia paraesthetica   . Mitral regurgitation   . PUD (peptic ulcer disease)   . PVD (peripheral vascular disease) ([place])   . Tobacco abuse     Past Surgical History:  Procedure Laterality Date  . CARDIAC  CATHETERIZATION  08/15/2007  . CARDIAC CATHETERIZATION Right 07/13/2016   Procedure: Right/Left Heart Cath and Coronary Angiography;  Surgeon: Dionisio David, MD;  Location: [facility];  Service: Cardiovascular;  Laterality: Right;  . COLONOSCOPY  03/2013  . ESOPHAGOGASTRODUODENOSCOPY  03/2013   Family History  Problem Relation Age of Onset  . CAD Mother   . Rheum arthritis Neg Hx   . Osteoarthritis Neg Hx   . Asthma Neg Hx   . Diabetes Neg Hx   . Cancer Neg Hx    Social History   Tobacco Use  . Smoking status: Former Smoker    Types: Cigarettes    Quit date: 08/30/2015    Years since quitting: 4.6  . Smokeless tobacco: Never Used  Substance Use Topics  . Alcohol use: No   No Known Allergies      Review of Systems  Constitutional: Positive for fatigue. Negative for appetite change.  HENT: Positive for hearing loss. Negative for congestion, postnasal drip and sore throat.   Eyes: Negative.   Respiratory: Positive for shortness of breath (minimal). Negative for cough, chest tightness and wheezing.   Cardiovascular: Negative for chest pain, palpitations and leg swelling.  Gastrointestinal: Negative for abdominal distention and abdominal pain.  Endocrine: Negative.  Genitourinary: Negative.   Musculoskeletal: Negative for back pain and neck pain.  Skin: Negative.   Allergic/Immunologic: Negative.   Neurological: Negative for dizziness and light-headedness.  Hematological: Negative for adenopathy. Does not bruise/bleed easily.  Psychiatric/Behavioral: Positive for sleep disturbance (chronic difficulty with oxygen at 3L). Negative for dysphoric mood and suicidal ideas. The patient is not nervous/anxious.      Physical Exam  Constitutional: He is oriented to person, place, and time. He appears well-developed and well-nourished.  HENT:  Head: Normocephalic and atraumatic.  Right Ear: Decreased hearing is noted.  Left Ear: Decreased hearing is noted.  Eyes: Pupils  are equal, round, and reactive to light. Conjunctivae are normal.  Neck: No JVD present.  Cardiovascular: Normal rate and regular rhythm.  Pulmonary/Chest: Effort normal. He has no wheezes. He has no rales.  Abdominal: Soft. He exhibits no distension. There is no abdominal tenderness.  Musculoskeletal:        General: No tenderness or edema.     Cervical back: Normal range of motion and neck supple.  Neurological: He is alert and oriented to person, place, and time.  Skin: Skin is warm and dry.  Psychiatric: He has a normal mood and affect. His behavior is normal. Thought content normal.  Nursing note and vitals reviewed.   Assessment & Plan:  1: Chronic heart failure with preserved ejection fraction with structural changes- - NYHA Class II - euvolemic today - continuing to weigh daily Reminded to call for an overnight weight gain of >2 pounds or a weekly weight gain of >5 pounds.   - saw cardiologist [name]) 04/18/20 - BNP 04/10/20 was 301.0  2: HTN-  - BP  - sees his PCP [name]) 12/27/16 - BMP 04/15/20 reviewed and showed sodium 143, potassium 4.6, creatinine 2.94 and GFR 19  3: COPD- - Wearing oxygen at 3L only at bedtime  - No wheezing heard  4: Diabetes- - A1c 04/15/20 was 7.4%

## 2020-04-21 ENCOUNTER — Telehealth: Payer: Self-pay | Admitting: Family

## 2020-04-21 ENCOUNTER — Ambulatory Visit: Payer: Medicare PPO | Admitting: Family

## 2020-04-21 NOTE — Telephone Encounter (Signed)
Patient did not show for his [facility] appointment on 04/21/20. Will attempt to reschedule.

## 2020-07-04 ENCOUNTER — Inpatient Hospital Stay
Admission: EM | Admit: 2020-07-04 | Discharge: 2020-07-11 | DRG: 291 | Disposition: A | Discharge status: Home Health | Payer: Medicare PPO | Attending: Internal Medicine | Admitting: Internal Medicine

## 2020-07-04 ENCOUNTER — Other Ambulatory Visit: Payer: Self-pay

## 2020-07-04 ENCOUNTER — Emergency Department: Payer: Medicare PPO

## 2020-07-04 DIAGNOSIS — K219 Gastro-esophageal reflux disease without esophagitis: Secondary | ICD-10-CM | POA: Diagnosis present

## 2020-07-04 DIAGNOSIS — Z8711 Personal history of peptic ulcer disease: Secondary | ICD-10-CM | POA: Diagnosis not present

## 2020-07-04 DIAGNOSIS — N184 Chronic kidney disease, stage 4 (severe): Secondary | ICD-10-CM | POA: Diagnosis present

## 2020-07-04 DIAGNOSIS — E1122 Type 2 diabetes mellitus with diabetic chronic kidney disease: Secondary | ICD-10-CM | POA: Diagnosis present

## 2020-07-04 DIAGNOSIS — I5033 Acute on chronic diastolic (congestive) heart failure: Secondary | ICD-10-CM | POA: Diagnosis present

## 2020-07-04 DIAGNOSIS — J9601 Acute respiratory failure with hypoxia: Secondary | ICD-10-CM | POA: Diagnosis not present

## 2020-07-04 DIAGNOSIS — R0603 Acute respiratory distress: Secondary | ICD-10-CM | POA: Diagnosis present

## 2020-07-04 DIAGNOSIS — Z8572 Personal history of non-Hodgkin lymphomas: Secondary | ICD-10-CM | POA: Diagnosis not present

## 2020-07-04 DIAGNOSIS — J9621 Acute and chronic respiratory failure with hypoxia: Secondary | ICD-10-CM | POA: Diagnosis present

## 2020-07-04 DIAGNOSIS — R1013 Epigastric pain: Secondary | ICD-10-CM | POA: Diagnosis not present

## 2020-07-04 DIAGNOSIS — Z9221 Personal history of antineoplastic chemotherapy: Secondary | ICD-10-CM

## 2020-07-04 DIAGNOSIS — I2510 Atherosclerotic heart disease of native coronary artery without angina pectoris: Secondary | ICD-10-CM | POA: Diagnosis present

## 2020-07-04 DIAGNOSIS — E1151 Type 2 diabetes mellitus with diabetic peripheral angiopathy without gangrene: Secondary | ICD-10-CM | POA: Diagnosis present

## 2020-07-04 DIAGNOSIS — N189 Chronic kidney disease, unspecified: Secondary | ICD-10-CM

## 2020-07-04 DIAGNOSIS — D509 Iron deficiency anemia, unspecified: Secondary | ICD-10-CM | POA: Diagnosis present

## 2020-07-04 DIAGNOSIS — E785 Hyperlipidemia, unspecified: Secondary | ICD-10-CM | POA: Diagnosis present

## 2020-07-04 DIAGNOSIS — J432 Centrilobular emphysema: Secondary | ICD-10-CM | POA: Diagnosis not present

## 2020-07-04 DIAGNOSIS — J439 Emphysema, unspecified: Secondary | ICD-10-CM | POA: Diagnosis present

## 2020-07-04 DIAGNOSIS — N179 Acute kidney failure, unspecified: Secondary | ICD-10-CM | POA: Diagnosis present

## 2020-07-04 DIAGNOSIS — I480 Paroxysmal atrial fibrillation: Secondary | ICD-10-CM | POA: Diagnosis present

## 2020-07-04 DIAGNOSIS — D500 Iron deficiency anemia secondary to blood loss (chronic): Secondary | ICD-10-CM | POA: Diagnosis not present

## 2020-07-04 DIAGNOSIS — J449 Chronic obstructive pulmonary disease, unspecified: Secondary | ICD-10-CM | POA: Diagnosis present

## 2020-07-04 DIAGNOSIS — I10 Essential (primary) hypertension: Secondary | ICD-10-CM

## 2020-07-04 DIAGNOSIS — D631 Anemia in chronic kidney disease: Secondary | ICD-10-CM | POA: Diagnosis present

## 2020-07-04 DIAGNOSIS — Z66 Do not resuscitate: Secondary | ICD-10-CM | POA: Diagnosis present

## 2020-07-04 DIAGNOSIS — Z7951 Long term (current) use of inhaled steroids: Secondary | ICD-10-CM

## 2020-07-04 DIAGNOSIS — Z794 Long term (current) use of insulin: Secondary | ICD-10-CM

## 2020-07-04 DIAGNOSIS — E873 Alkalosis: Secondary | ICD-10-CM | POA: Diagnosis not present

## 2020-07-04 DIAGNOSIS — Z8249 Family history of ischemic heart disease and other diseases of the circulatory system: Secondary | ICD-10-CM

## 2020-07-04 DIAGNOSIS — I130 Hypertensive heart and chronic kidney disease with heart failure and stage 1 through stage 4 chronic kidney disease, or unspecified chronic kidney disease: Secondary | ICD-10-CM | POA: Diagnosis present

## 2020-07-04 DIAGNOSIS — R195 Other fecal abnormalities: Secondary | ICD-10-CM | POA: Diagnosis not present

## 2020-07-04 DIAGNOSIS — I080 Rheumatic disorders of both mitral and aortic valves: Secondary | ICD-10-CM | POA: Diagnosis present

## 2020-07-04 DIAGNOSIS — Z79899 Other long term (current) drug therapy: Secondary | ICD-10-CM | POA: Diagnosis not present

## 2020-07-04 DIAGNOSIS — D508 Other iron deficiency anemias: Secondary | ICD-10-CM | POA: Diagnosis not present

## 2020-07-04 DIAGNOSIS — E1100 Type 2 diabetes mellitus with hyperosmolarity without nonketotic hyperglycemic-hyperosmolar coma (NKHHC): Secondary | ICD-10-CM

## 2020-07-04 DIAGNOSIS — Z7902 Long term (current) use of antithrombotics/antiplatelets: Secondary | ICD-10-CM | POA: Diagnosis not present

## 2020-07-04 DIAGNOSIS — H6123 Impacted cerumen, bilateral: Secondary | ICD-10-CM

## 2020-07-04 DIAGNOSIS — Z87891 Personal history of nicotine dependence: Secondary | ICD-10-CM | POA: Diagnosis not present

## 2020-07-04 DIAGNOSIS — Z862 Personal history of diseases of the blood and blood-forming organs and certain disorders involving the immune mechanism: Secondary | ICD-10-CM

## 2020-07-04 DIAGNOSIS — Z20822 Contact with and (suspected) exposure to covid-19: Secondary | ICD-10-CM | POA: Diagnosis present

## 2020-07-04 DIAGNOSIS — I5031 Acute diastolic (congestive) heart failure: Secondary | ICD-10-CM | POA: Insufficient documentation

## 2020-07-04 DIAGNOSIS — N1832 Chronic kidney disease, stage 3b: Secondary | ICD-10-CM | POA: Diagnosis not present

## 2020-07-04 DIAGNOSIS — R809 Proteinuria, unspecified: Secondary | ICD-10-CM | POA: Diagnosis present

## 2020-07-04 DIAGNOSIS — I509 Heart failure, unspecified: Secondary | ICD-10-CM

## 2020-07-04 LAB — TROPONIN I (HIGH SENSITIVITY)
Troponin I (High Sensitivity): 23 ng/L — ABNORMAL HIGH (ref <18)
Troponin I (High Sensitivity): 24 ng/L — ABNORMAL HIGH (ref <18)

## 2020-07-04 LAB — CBC WITH DIFFERENTIAL/PLATELET
Abs Immature Granulocytes: 0.06 10*3/uL (ref 0.00–0.07)
Basophils Absolute: 0 10*3/uL (ref 0.0–0.1)
Basophils Relative: 0 %
Eosinophils Absolute: 0.2 10*3/uL (ref 0.0–0.5)
Eosinophils Relative: 2 %
HCT: 24.3 % — ABNORMAL LOW (ref 39.0–52.0)
Hemoglobin: 7.3 g/dL — ABNORMAL LOW (ref 13.0–17.0)
Immature Granulocytes: 1 %
Lymphocytes Relative: 7 %
Lymphs Abs: 0.8 10*3/uL (ref 0.7–4.0)
MCH: 27.9 pg (ref 26.0–34.0)
MCHC: 30 g/dL (ref 30.0–36.0)
MCV: 92.7 fL (ref 80.0–100.0)
Monocytes Absolute: 0.9 10*3/uL (ref 0.1–1.0)
Monocytes Relative: 8 %
Neutro Abs: 9.7 10*3/uL — ABNORMAL HIGH (ref 1.7–7.7)
Neutrophils Relative %: 82 %
Platelets: 119 10*3/uL — ABNORMAL LOW (ref 150–400)
RBC: 2.62 MIL/uL — ABNORMAL LOW (ref 4.22–5.81)
RDW: 17.5 % — ABNORMAL HIGH (ref 11.5–15.5)
WBC: 11.7 10*3/uL — ABNORMAL HIGH (ref 4.0–10.5)
nRBC: 0.2 % (ref 0.0–0.2)

## 2020-07-04 LAB — COMPREHENSIVE METABOLIC PANEL
ALT: 13 U/L (ref 0–44)
AST: 17 U/L (ref 15–41)
Albumin: 3.4 g/dL — ABNORMAL LOW (ref 3.5–5.0)
Alkaline Phosphatase: 76 U/L (ref 38–126)
Anion gap: 10 (ref 5–15)
BUN: 34 mg/dL — ABNORMAL HIGH (ref 8–23)
CO2: 28 mmol/L (ref 22–32)
Calcium: 7.7 mg/dL — ABNORMAL LOW (ref 8.9–10.3)
Chloride: 105 mmol/L (ref 98–111)
Creatinine, Ser: 2.54 mg/dL — ABNORMAL HIGH (ref 0.61–1.24)
GFR calc Af Amer: 26 mL/min — ABNORMAL LOW (ref >60)
GFR calc non Af Amer: 22 mL/min — ABNORMAL LOW (ref >60)
Glucose, Bld: 124 mg/dL — ABNORMAL HIGH (ref 70–99)
Potassium: 4.7 mmol/L (ref 3.5–5.1)
Sodium: 143 mmol/L (ref 135–145)
Total Bilirubin: 0.8 mg/dL (ref 0.3–1.2)
Total Protein: 7.6 g/dL (ref 6.5–8.1)

## 2020-07-04 LAB — GLUCOSE, CAPILLARY
Glucose-Capillary: 195 mg/dL — ABNORMAL HIGH (ref 70–99)
Glucose-Capillary: 326 mg/dL — ABNORMAL HIGH (ref 70–99)

## 2020-07-04 LAB — BRAIN NATRIURETIC PEPTIDE: B Natriuretic Peptide: 816.6 pg/mL — ABNORMAL HIGH (ref 0.0–100.0)

## 2020-07-04 LAB — LACTIC ACID, PLASMA
Lactic Acid, Venous: 0.9 mmol/L (ref 0.5–1.9)
Lactic Acid, Venous: 1.4 mmol/L (ref 0.5–1.9)

## 2020-07-04 LAB — HEMOGLOBIN A1C
Hgb A1c MFr Bld: 5.2 % (ref 4.8–5.6)
Mean Plasma Glucose: 102.54 mg/dL

## 2020-07-04 LAB — SARS CORONAVIRUS 2 BY RT PCR (HOSPITAL ORDER, PERFORMED IN ~~LOC~~ HOSPITAL LAB): SARS Coronavirus 2: NEGATIVE

## 2020-07-04 LAB — PROCALCITONIN: Procalcitonin: <0.1 ng/mL

## 2020-07-04 MED ORDER — TAMSULOSIN HCL 0.4 MG PO CAPS
0.4000 mg | ORAL_CAPSULE | Freq: Every day | ORAL | Status: DC
  Administered 2020-07-04 – 2020-07-10 (×7): 0.4 mg via ORAL
  Filled 2020-07-04 (×7): qty 1

## 2020-07-04 MED ORDER — TIOTROPIUM BROMIDE MONOHYDRATE 18 MCG IN CAPS
18.0000 ug | ORAL_CAPSULE | Freq: Every day | RESPIRATORY_TRACT | Status: DC
  Administered 2020-07-05 – 2020-07-11 (×7): 18 ug via RESPIRATORY_TRACT
  Filled 2020-07-04: qty 5

## 2020-07-04 MED ORDER — CLOPIDOGREL BISULFATE 75 MG PO TABS
75.0000 mg | ORAL_TABLET | Freq: Every day | ORAL | Status: DC
  Administered 2020-07-04 – 2020-07-06 (×3): 75 mg via ORAL
  Filled 2020-07-04 (×3): qty 1

## 2020-07-04 MED ORDER — IPRATROPIUM-ALBUTEROL 0.5-2.5 (3) MG/3ML IN SOLN
3.0000 mL | Freq: Once | RESPIRATORY_TRACT | Status: AC
  Administered 2020-07-04: 3 mL via RESPIRATORY_TRACT
  Filled 2020-07-04: qty 3

## 2020-07-04 MED ORDER — METOPROLOL SUCCINATE ER 50 MG PO TB24
200.0000 mg | ORAL_TABLET | Freq: Every day | ORAL | Status: DC
  Administered 2020-07-04: 50 mg via ORAL
  Administered 2020-07-05 – 2020-07-11 (×7): 200 mg via ORAL
  Filled 2020-07-04 (×8): qty 4

## 2020-07-04 MED ORDER — ONDANSETRON HCL 4 MG/2ML IJ SOLN: 4.0000 mg | Freq: Four times a day (QID) | INTRAMUSCULAR | Status: DC | PRN

## 2020-07-04 MED ORDER — POLYETHYLENE GLYCOL 3350 17 G PO PACK
17.0000 g | PACK | Freq: Every day | ORAL | Status: DC
  Administered 2020-07-05 – 2020-07-10 (×5): 17 g via ORAL
  Filled 2020-07-04 (×6): qty 1

## 2020-07-04 MED ORDER — SODIUM CHLORIDE 0.9 % IV SOLN: 250.0000 mL | INTRAVENOUS | Status: DC | PRN

## 2020-07-04 MED ORDER — FUROSEMIDE 10 MG/ML IJ SOLN
40.0000 mg | Freq: Once | INTRAMUSCULAR | Status: AC
  Administered 2020-07-04: 40 mg via INTRAVENOUS
  Filled 2020-07-04: qty 4

## 2020-07-04 MED ORDER — ACETAMINOPHEN 325 MG PO TABS
650.0000 mg | ORAL_TABLET | ORAL | Status: DC | PRN
  Filled 2020-07-04: qty 2

## 2020-07-04 MED ORDER — ISOSORBIDE MONONITRATE ER 60 MG PO TB24
30.0000 mg | ORAL_TABLET | Freq: Every day | ORAL | Status: DC
  Administered 2020-07-04 – 2020-07-11 (×8): 30 mg via ORAL
  Filled 2020-07-04 (×8): qty 1

## 2020-07-04 MED ORDER — SODIUM CHLORIDE 0.9% FLUSH
3.0000 mL | INTRAVENOUS | Status: DC | PRN
  Administered 2020-07-11: 3 mL via INTRAVENOUS

## 2020-07-04 MED ORDER — ROSUVASTATIN CALCIUM 20 MG PO TABS
40.0000 mg | ORAL_TABLET | Freq: Every day | ORAL | Status: DC
  Administered 2020-07-04 – 2020-07-11 (×8): 40 mg via ORAL
  Filled 2020-07-04: qty 2
  Filled 2020-07-04: qty 4
  Filled 2020-07-04 (×4): qty 2
  Filled 2020-07-04 (×2): qty 4
  Filled 2020-07-04: qty 2
  Filled 2020-07-04 (×2): qty 4
  Filled 2020-07-04 (×2): qty 2

## 2020-07-04 MED ORDER — SODIUM CHLORIDE 0.9% FLUSH
3.0000 mL | Freq: Two times a day (BID) | INTRAVENOUS | Status: DC
  Administered 2020-07-04 – 2020-07-11 (×13): 3 mL via INTRAVENOUS

## 2020-07-04 MED ORDER — ENOXAPARIN SODIUM 40 MG/0.4ML ~~LOC~~ SOLN: 40.0000 mg | SUBCUTANEOUS | Status: DC

## 2020-07-04 MED ORDER — INSULIN ASPART 100 UNIT/ML ~~LOC~~ SOLN
0.0000 [IU] | Freq: Every day | SUBCUTANEOUS | Status: DC
  Administered 2020-07-04: 4 [IU] via SUBCUTANEOUS
  Filled 2020-07-04: qty 1

## 2020-07-04 MED ORDER — PANTOPRAZOLE SODIUM 40 MG PO TBEC
40.0000 mg | DELAYED_RELEASE_TABLET | Freq: Every day | ORAL | Status: DC
  Administered 2020-07-04 – 2020-07-08 (×5): 40 mg via ORAL
  Filled 2020-07-04 (×5): qty 1

## 2020-07-04 MED ORDER — IPRATROPIUM-ALBUTEROL 0.5-2.5 (3) MG/3ML IN SOLN: 3.0000 mL | Freq: Four times a day (QID) | RESPIRATORY_TRACT | Status: DC | PRN

## 2020-07-04 MED ORDER — MOMETASONE FURO-FORMOTEROL FUM 100-5 MCG/ACT IN AERO
2.0000 | INHALATION_SPRAY | Freq: Two times a day (BID) | RESPIRATORY_TRACT | Status: DC
  Administered 2020-07-04 – 2020-07-11 (×13): 2 via RESPIRATORY_TRACT
  Filled 2020-07-04: qty 8.8

## 2020-07-04 MED ORDER — VITAMIN D (ERGOCALCIFEROL) 1.25 MG (50000 UNIT) PO CAPS
50000.0000 [IU] | ORAL_CAPSULE | ORAL | Status: DC
  Administered 2020-07-05: 50000 [IU] via ORAL
  Filled 2020-07-04: qty 1

## 2020-07-04 MED ORDER — INSULIN ASPART 100 UNIT/ML ~~LOC~~ SOLN
0.0000 [IU] | Freq: Three times a day (TID) | SUBCUTANEOUS | Status: DC
  Administered 2020-07-05: 3 [IU] via SUBCUTANEOUS
  Administered 2020-07-05 (×2): 2 [IU] via SUBCUTANEOUS
  Administered 2020-07-06 – 2020-07-07 (×2): 1 [IU] via SUBCUTANEOUS
  Administered 2020-07-07 – 2020-07-08 (×2): 2 [IU] via SUBCUTANEOUS
  Filled 2020-07-04 (×7): qty 1

## 2020-07-04 MED ORDER — FERROUS SULFATE 325 (65 FE) MG PO TABS
325.0000 mg | ORAL_TABLET | Freq: Two times a day (BID) | ORAL | Status: DC
  Administered 2020-07-04 – 2020-07-11 (×14): 325 mg via ORAL
  Filled 2020-07-04 (×13): qty 1

## 2020-07-04 MED ORDER — AMLODIPINE BESYLATE 5 MG PO TABS
5.0000 mg | ORAL_TABLET | Freq: Every day | ORAL | Status: DC
  Administered 2020-07-04 – 2020-07-11 (×8): 5 mg via ORAL
  Filled 2020-07-04 (×9): qty 1

## 2020-07-04 MED ORDER — GABAPENTIN 100 MG PO CAPS
100.0000 mg | ORAL_CAPSULE | Freq: Two times a day (BID) | ORAL | Status: DC
  Administered 2020-07-04 – 2020-07-11 (×14): 100 mg via ORAL
  Filled 2020-07-04 (×14): qty 1

## 2020-07-04 MED ORDER — FUROSEMIDE 10 MG/ML IJ SOLN
40.0000 mg | Freq: Two times a day (BID) | INTRAMUSCULAR | Status: DC
  Administered 2020-07-04 – 2020-07-05 (×2): 40 mg via INTRAVENOUS
  Filled 2020-07-04 (×2): qty 4

## 2020-07-04 NOTE — ED Notes (Signed)
Attempted to call pt's friend and POA Claudette Head but did not get an answer

## 2020-07-04 NOTE — ED Notes (Addendum)
Attempted to call report to 2A, per secretary the RN taking the patient is starting blood and will call me back. Call back number given.

## 2020-07-04 NOTE — Progress Notes (Signed)
Pt taken off bipap and placed on 4lpm [employer], sats 93%, respiratory rate 18/min, no shortness of breath noted.

## 2020-07-04 NOTE — ED Notes (Signed)
222 ml urine in bladder per bladder scanner

## 2020-07-04 NOTE — Progress Notes (Addendum)
As per days shift report pt was a diabetic pt but no CBG and sliding parameter order in the [facility]. Notify [facility] and states will place order. Will continue to monitor.  Update 0449: Pt hgb 7.3, but CBC was not order this morning. Notify NP [facility]. Will continue to monitor.  Update 0455: Ouma place order for CBC. Will continue to monitor.  Update [phone]: pt hgb at 6.4. Notify [facility] NP. Will continue to monitor.  Update [phone]: Talked to [facility] NP and states will place order. Will notify incoming shift. Will continue to monitor.

## 2020-07-04 NOTE — ED Notes (Signed)
Lab contacted about pending lactic acid, lab states they will run it now

## 2020-07-04 NOTE — ED Notes (Signed)
Pt at 88% on 4L [place], placed back on bipap

## 2020-07-04 NOTE — H&P (Addendum)
History and Physical    Brett [name] JYN:[id number] DOB: 08/18/1935 DOA: 07/04/2020  PCP: Perrin Maltese, MD   Patient coming from: Home  I have personally briefly reviewed patient's old medical records in [place]  Chief Complaint: Shortness of breath Patient is hard of hearing and is difficult to get any history  HPI: Brett [name] is a 85 y.o. male with medical history significant for chronic diastolic dysfunction CHF, coronary artery disease, hypertension, diabetes mellitus with complications of stage IV chronic kidney disease who presents from home via EMS for evaluation of shortness of breath.  Patient noted to be in respiratory distress with labored breathing and on room air at rest had pulse oximetry of 60%.  He was placed on 6 L of oxygen by nasal cannula without any improvement and was eventually transitioned to a nonrebreather mask. He has a wet cough and has bilateral lower extremity swelling.  He denies having any chest pain, diaphoresis or palpitations.  He denies having any fever or chills but complains of pain in his lower abdomen.  Denies having any frequency, nocturia or dysuria. Labs show a BUN of 34 creatinine of 2.5 consistent with patient's known history of chronic kidney disease, BNP of 816, hemoglobin of 7.3 and white count of 11.7 with predominant neutrophils. Chest x-ray reviewed by me shows bilateral pleural effusions. Twelve-lead EKG reviewed by me shows sinus rhythm   ED Course: Patient is an 85 year old Caucasian male who presents to the emergency room via EMS for evaluation of shortness of breath and found to be hypoxic requiring noninvasive mechanical ventilation to reduce work of breathing and improve oxygenation.  Chest x-ray is consistent with congestive heart failure.  He will be admitted to the hospital for further evaluation.  Review of Systems: As per HPI otherwise 10 point review of systems negative.    Past Medical History:  Diagnosis  Date   Anemia    Aortic regurgitation    B-cell lymphoma ([place]) 2009   DX AT [facility]   Bronchitis    CAD (coronary artery disease)    Carotid stenosis    CHF (congestive heart failure) ([place])    Diabetes mellitus without complication ([place])    Diabetes mellitus, type 2 ([facility])    Emphysema of lung ([place])    Essential hypertension    History of chemotherapy    Hyperlipidemia    Hypertension    IDA (iron deficiency anemia)    Leg edema    Meralgia paraesthetica    Mitral regurgitation    PUD (peptic ulcer disease)    PVD (peripheral vascular disease) ([place])    Tobacco abuse     Past Surgical History:  Procedure Laterality Date   CARDIAC CATHETERIZATION  08/15/2007   CARDIAC CATHETERIZATION Right 07/13/2016   Procedure: Right/Left Heart Cath and Coronary Angiography;  Surgeon: Dionisio David, MD;  Location: [facility];  Service: Cardiovascular;  Laterality: Right;   COLONOSCOPY  03/2013   ESOPHAGOGASTRODUODENOSCOPY  03/2013     reports that he quit smoking about 4 years ago. His smoking use included cigarettes. He has never used smokeless tobacco. He reports that he does not drink alcohol and does not use drugs.  No Known Allergies  Family History  Problem Relation Age of Onset   CAD Mother    Rheum arthritis Neg Hx    Osteoarthritis Neg Hx    Asthma Neg Hx    Diabetes Neg Hx    Cancer Neg Hx  Prior to Admission medications   Medication Sig Start Date End Date Taking? Authorizing Provider  albuterol (PROVENTIL HFA;VENTOLIN HFA) 108 (90 Base) MCG/ACT inhaler Inhale 2 puffs into the lungs every 6 (six) hours as needed for wheezing or shortness of breath. 09/24/17   Loletha Grayer, MD  amLODipine (NORVASC) 5 MG tablet Take 5 mg by mouth daily.  08/27/15   [provider]  clopidogrel (PLAVIX) 75 MG tablet Take 75 mg by mouth daily.  08/27/15   [provider]  dextromethorphan-guaiFENesin (MUCINEX DM) 30-600 MG 12hr  tablet Take 1 tablet by mouth 2 (two) times daily as needed for cough. 04/15/20   Oretha Milch D, MD  ferrous sulfate 325 (65 FE) MG tablet Take 1 tablet (325 mg total) by mouth 2 (two) times daily with a meal. 04/15/20   Oretha Milch D, MD  gabapentin (NEURONTIN) 100 MG capsule Take 1 capsule by mouth 2 (two) times daily.  09/22/15   [provider]  glimepiride (AMARYL) 2 MG tablet Take 1 tablet (2 mg total) by mouth daily with breakfast. Patient taking differently: Take 4 mg by mouth 2 (two) times daily.  07/16/19 04/04/20  Loletha Grayer, MD  insulin glargine (LANTUS) 100 UNIT/ML injection Inject 0.15 mLs (15 Units total) into the skin at bedtime. 04/15/20   Oretha Milch D, MD  ipratropium-albuterol (DUONEB) 0.5-2.5 (3) MG/3ML SOLN J44.1  71ml nebulizer every six hours as needed for shortness of breath Patient taking differently: Inhale 3 mLs into the lungs every 6 (six) hours as needed.  07/16/19   Loletha Grayer, MD  isosorbide mononitrate (IMDUR) 30 MG 24 hr tablet Take 30 mg by mouth daily.    [provider]  metoprolol (TOPROL-XL) 200 MG 24 hr tablet Take 200 mg by mouth daily.    [provider]  mometasone-formoterol (DULERA) 100-5 MCG/ACT AERO Inhale 2 puffs into the lungs 2 (two) times daily. Patient not taking: Reported on 04/04/2020 07/16/19   Loletha Grayer, MD  pantoprazole (PROTONIX) 40 MG tablet Take 40 mg by mouth daily.    [provider]  polyethylene glycol (MIRALAX / GLYCOLAX) 17 g packet Take 17 g by mouth daily as needed. 04/15/20   Oretha Milch D, MD  rosuvastatin (CRESTOR) 40 MG tablet Take 40 mg by mouth daily. 04/13/19   [provider]  tamsulosin (FLOMAX) 0.4 MG CAPS capsule Take 1 capsule (0.4 mg total) by mouth daily after supper. 04/15/20   Desiree Hane, MD  tiotropium (SPIRIVA HANDIHALER) 18 MCG inhalation capsule Place 1 capsule (18 mcg total) into inhaler and inhale daily. Patient not taking: Reported on  11/25/2019 05/31/18   Henreitta Leber, MD  Vitamin D, Ergocalciferol, (DRISDOL) 1.25 MG (50000 UT) CAPS capsule Take 50,000 Units by mouth once a week.    [provider]    Physical Exam: Vitals:   07/04/20 0856 07/04/20 0858  BP: (!) 149/69   Pulse: 79   Resp: (!) 27   Temp: 98.3 F (36.8 C)   TempSrc: Oral   SpO2: 100%   Weight:  86 kg     Vitals:   07/04/20 0856 07/04/20 0858  BP: (!) 149/69   Pulse: 79   Resp: (!) 27   Temp: 98.3 F (36.8 C)   TempSrc: Oral   SpO2: 100%   Weight:  86 kg    Constitutional: NAD, alert and oriented x 2. Hard of hearing Eyes: PERRL, lids and conjunctiva pallor ENMT: Mucous membranes are moist.  Neck:  normal, supple, no masses, no thyromegaly Respiratory: Crackles in both lower lung zones, no wheezing. Normal respiratory effort. No accessory muscle use.  Cardiovascular: Regular rate and rhythm, no murmurs / rubs / gallops. 2+ extremity edema. 2+ pedal pulses. No carotid bruits.  Abdomen: Suprapubic tenderness, no masses palpated. No hepatosplenomegaly. Bowel sounds positive.  Musculoskeletal: no clubbing / cyanosis. No joint deformity upper and lower extremities.  Skin: no rashes, lesions, ulcers.  Neurologic: No gross focal neurologic deficit. Psychiatric: Normal mood and affect.   Labs on Admission: I have personally reviewed following labs and imaging studies  CBC: Recent Labs  Lab 07/04/20 0912  WBC 11.7*  NEUTROABS 9.7*  HGB 7.3*  HCT 24.3*  MCV 92.7  PLT 182*   Basic Metabolic Panel: Recent Labs  Lab 07/04/20 0912  NA 143  K 4.7  CL 105  CO2 28  GLUCOSE 124*  BUN 34*  CREATININE 2.54*  CALCIUM 7.7*   GFR: Estimated Creatinine Clearance: 23.9 mL/min (A) (by C-G formula based on SCr of 2.54 mg/dL (H)). Liver Function Tests: Recent Labs  Lab 07/04/20 0912  AST 17  ALT 13  ALKPHOS 76  BILITOT 0.8  PROT 7.6  ALBUMIN 3.4*   No results for input(s): LIPASE, AMYLASE in the last 168 hours. No  results for input(s): AMMONIA in the last 168 hours. Coagulation Profile: No results for input(s): INR, PROTIME in the last 168 hours. Cardiac Enzymes: No results for input(s): CKTOTAL, CKMB, CKMBINDEX, TROPONINI in the last 168 hours. BNP (last 3 results) No results for input(s): PROBNP in the last 8760 hours. HbA1C: No results for input(s): HGBA1C in the last 72 hours. CBG: No results for input(s): GLUCAP in the last 168 hours. Lipid Profile: No results for input(s): CHOL, HDL, LDLCALC, TRIG, CHOLHDL, LDLDIRECT in the last 72 hours. Thyroid Function Tests: No results for input(s): TSH, T4TOTAL, FREET4, T3FREE, THYROIDAB in the last 72 hours. Anemia Panel: No results for input(s): VITAMINB12, FOLATE, FERRITIN, TIBC, IRON, RETICCTPCT in the last 72 hours. Urine analysis:    Component Value Date/Time   COLORURINE YELLOW (A) 04/04/2020 0628   APPEARANCEUR CLEAR (A) 04/04/2020 0628   LABSPEC 1.011 04/04/2020 0628   PHURINE 6.0 04/04/2020 0628   GLUCOSEU 50 (A) 04/04/2020 0628   HGBUR NEGATIVE 04/04/2020 0628   BILIRUBINUR NEGATIVE 04/04/2020 0628   KETONESUR NEGATIVE 04/04/2020 0628   PROTEINUR 100 (A) 04/04/2020 0628   NITRITE NEGATIVE 04/04/2020 0628   LEUKOCYTESUR NEGATIVE 04/04/2020 0628    Radiological Exams on Admission: DG Chest Portable 1 View  Result Date: 07/04/2020 CLINICAL DATA:  Shortness of breath EXAM: PORTABLE CHEST 1 VIEW COMPARISON:  04/13/2020 FINDINGS: Cardiac shadow remains enlarged. Increased central vascular congestion is noted with small effusions bilaterally. Mild parenchymal edema is noted as well. IMPRESSION: Increasing CHF. Electronically Signed   By: Inez Catalina M.D.   On: 07/04/2020 09:27    EKG: Independently reviewed.  Sinus rhythm  Assessment/Plan Principal Problem:   Acute on chronic diastolic CHF (congestive heart failure) (HCC) Active Problems:   COPD (chronic obstructive pulmonary disease) (H[facility])   Essential hypertension   Type 2  diabetes mellitus with stage 4 chronic kidney disease (H[facility])   History of anemia due to CKD   AF (paroxysmal atrial fibrillation) ([facility])     Acute on chronic diastolic dysfunction CHF Last known LVEF of 50-55% from May 2021 Patient presents for evaluation of worsening shortness of breath and imaging shows bilateral pleural effusions. BNP is also elevated We will place  patient on Lasix 40 mg IV every 12 Maintain low-sodium diet Optimize blood pressure control.  Continue amlodipine and metoprolol Patient to renal insufficiency Check daily weights   Acute respiratory failure Secondary to acute on chronic diastolic dysfunction CHF Patient was hypoxic upon arrival to the ER room air pulse oximetry of 60% and required noninvasive mechanical ventilation to reduce work of breathing and improve oxygenation We will wean patient off BiPAP as tolerated He will need to be assessed for home oxygen need prior to discharge  History of paroxysmal atrial fibrillation Continue metoprolol for rate control Patient is not on long-term anticoagulation due to increased risk of bleeding   Diabetes mellitus with complications of stage IV chronic kidney disease Maintain consistent carbohydrate diet Place patient on sliding scale insulin for glycemic control We will request nephrology consult for stage IV chronic kidney disease with associated anemia   Anemia due to chronic kidney disease Patient has a hemoglobin of 7.3g/dl Shortness of breath may be related anemia as well Will defer to nephrology to initiate erythropoietin stimulating agent Continue iron supplements   History of coronary artery disease Continue Plavix, nitrates, beta-blockers and statins   GERD  Continue oral PPI   DVT prophylaxis: SCD Code Status: DNR Family Communication: Greater than 50% of time was spent discussing patient's condition and plan of care with his HPOA, Mr [name] over the phone.  All questions and concerns have  been addressed.  He verbalizes understanding and agrees with the plan. Disposition Plan: Back to previous home environment Consults called: Nephrology    Collier Bullock MD [facility]     07/04/2020, 12:14 PM

## 2020-07-04 NOTE — Progress Notes (Signed)
Pt placed back on bipap by RN due to desating, tolerating well at this time. sats 94%, respiratory rate 28/min.

## 2020-07-04 NOTE — ED Provider Notes (Signed)
[facility] Emergency Department Provider Note    First MD Initiated Contact with Patient 07/04/20 [phone]     (approximate)  I have reviewed the triage vital signs and the nursing notes.   HISTORY  Chief Complaint Respiratory Distress  Level V Caveat: Resp distress  HPI Brett [name] is a 85 y.o. male below listed past medical history presents to the ER for shortness of breath.  Patient found to be profoundly hypoxic requiring nonrebreather.  He is very hard of hearing very poor historian making history taking more difficult in the setting of his respiratory distress.  Does appear to be protecting his airway.    Past Medical History:  Diagnosis Date  . Anemia   . Aortic regurgitation   . B-cell lymphoma ([place]) 2009   DX AT [facility]  . Bronchitis   . CAD (coronary artery disease)   . Carotid stenosis   . CHF (congestive heart failure) ([place])   . Diabetes mellitus without complication ([place])   . Diabetes mellitus, type 2 ([place])   . Emphysema of lung ([place])   . Essential hypertension   . History of chemotherapy   . Hyperlipidemia   . Hypertension   . IDA (iron deficiency anemia)   . Leg edema   . Meralgia paraesthetica   . Mitral regurgitation   . PUD (peptic ulcer disease)   . PVD (peripheral vascular disease) ([place])   . Tobacco abuse    Family History  Problem Relation Age of Onset  . CAD Mother   . Rheum arthritis Neg Hx   . Osteoarthritis Neg Hx   . Asthma Neg Hx   . Diabetes Neg Hx   . Cancer Neg Hx    Past Surgical History:  Procedure Laterality Date  . CARDIAC CATHETERIZATION  08/15/2007  . CARDIAC CATHETERIZATION Right 07/13/2016   Procedure: Right/Left Heart Cath and Coronary Angiography;  Surgeon: Dionisio David, MD;  Location: [facility];  Service: Cardiovascular;  Laterality: Right;  . COLONOSCOPY  03/2013  . ESOPHAGOGASTRODUODENOSCOPY  03/2013   Patient Active Problem List   Diagnosis Date Noted  . Hypotension  04/14/2020  . AKI (acute kidney injury) ([place])   . Goals of care, counseling/discussion   . Palliative care by specialist   . CHF, acute on chronic ([place]) 04/04/2020  . CKD (chronic kidney disease), stage IIIb 04/04/2020  . Type II diabetes mellitus with renal manifestations ([place]) 04/04/2020  . HLD (hyperlipidemia) 04/04/2020  . Acute on chronic diastolic CHF (congestive heart failure) ([place]) 04/04/2020  . Iron deficiency anemia 04/04/2020  . Hypernatremia   . Dysphagia following unspecified cerebrovascular disease   . High serum osmolar gap 11/26/2019  . Atrial fibrillation, chronic ([place]) 11/25/2019  . Acute encephalopathy 11/25/2019  . Atrial fibrillation with RVR ([place]) 05/22/2019  . Acute on chronic respiratory failure with hypoxemia ([place]) 02/09/2019  . Pneumonia 01/21/2019  . Type 2 diabetes mellitus with hyperosmolar nonketotic hyperglycemia ([place]) 08/24/2018  . CHF exacerbation ([place]) 05/29/2018  . COPD exacerbation ([place]) 12/05/2017  . CAP (community acquired pneumonia) 11/13/2017  . Acute respiratory failure ([place]) 11/13/2017  . SIRS (systemic inflammatory response syndrome) ([place]) 11/13/2017  . Stroke ([employer]) 12/02/2016  . Stroke (cerebrum) ([place]) 12/02/2016  . Leg weakness, bilateral 09/03/2016  . Chronic diastolic heart failure ([place]) 08/06/2016  . Pleural effusion 08/05/2016  . Sepsis ([place]) 07/10/2016  . HCAP (healthcare-associated pneumonia) 07/10/2016  . HTN (hypertension) 07/10/2016  . Diabetes ([place]) 07/10/2016  . CAD (coronary artery  disease) 07/10/2016  . COPD (chronic obstructive pulmonary disease) ([place]) 06/20/2016  . Chest pain 06/18/2016  . B-cell lymphoma ([place]) 10/14/2015      Prior to Admission medications   Medication Sig Start Date End Date Taking? Authorizing Provider  albuterol (PROVENTIL HFA;VENTOLIN HFA) 108 (90 Base) MCG/ACT inhaler Inhale 2 puffs into the lungs every 6 (six) hours as needed for wheezing or shortness of breath. 09/24/17   Loletha Grayer,  MD  amLODipine (NORVASC) 5 MG tablet Take 5 mg by mouth daily.  08/27/15   [provider]  clopidogrel (PLAVIX) 75 MG tablet Take 75 mg by mouth daily.  08/27/15   [provider]  dextromethorphan-guaiFENesin (MUCINEX DM) 30-600 MG 12hr tablet Take 1 tablet by mouth 2 (two) times daily as needed for cough. 04/15/20   Oretha Milch D, MD  ferrous sulfate 325 (65 FE) MG tablet Take 1 tablet (325 mg total) by mouth 2 (two) times daily with a meal. 04/15/20   Oretha Milch D, MD  gabapentin (NEURONTIN) 100 MG capsule Take 1 capsule by mouth 2 (two) times daily.  09/22/15   [provider]  glimepiride (AMARYL) 2 MG tablet Take 1 tablet (2 mg total) by mouth daily with breakfast. Patient taking differently: Take 4 mg by mouth 2 (two) times daily.  07/16/19 04/04/20  Loletha Grayer, MD  insulin glargine (LANTUS) 100 UNIT/ML injection Inject 0.15 mLs (15 Units total) into the skin at bedtime. 04/15/20   Oretha Milch D, MD  ipratropium-albuterol (DUONEB) 0.5-2.5 (3) MG/3ML SOLN J44.1  87ml nebulizer every six hours as needed for shortness of breath Patient taking differently: Inhale 3 mLs into the lungs every 6 (six) hours as needed.  07/16/19   Loletha Grayer, MD  isosorbide mononitrate (IMDUR) 30 MG 24 hr tablet Take 30 mg by mouth daily.    [provider]  metoprolol (TOPROL-XL) 200 MG 24 hr tablet Take 200 mg by mouth daily.    [provider]  mometasone-formoterol (DULERA) 100-5 MCG/ACT AERO Inhale 2 puffs into the lungs 2 (two) times daily. Patient not taking: Reported on 04/04/2020 07/16/19   Loletha Grayer, MD  pantoprazole (PROTONIX) 40 MG tablet Take 40 mg by mouth daily.    [provider]  polyethylene glycol (MIRALAX / GLYCOLAX) 17 g packet Take 17 g by mouth daily as needed. 04/15/20   Oretha Milch D, MD  rosuvastatin (CRESTOR) 40 MG tablet Take 40 mg by mouth daily. 04/13/19   [provider]  tamsulosin (FLOMAX) 0.4 MG CAPS  capsule Take 1 capsule (0.4 mg total) by mouth daily after supper. 04/15/20   Desiree Hane, MD  tiotropium (SPIRIVA HANDIHALER) 18 MCG inhalation capsule Place 1 capsule (18 mcg total) into inhaler and inhale daily. Patient not taking: Reported on 11/25/2019 05/31/18   Henreitta Leber, MD  Vitamin D, Ergocalciferol, (DRISDOL) 1.25 MG (50000 UT) CAPS capsule Take 50,000 Units by mouth once a week.    [provider]    Allergies Patient has no known allergies.    Social History Social History   Tobacco Use  . Smoking status: Former Smoker    Types: Cigarettes    Quit date: 08/30/2015    Years since quitting: 4.8  . Smokeless tobacco: Never Used  Vaping Use  . Vaping Use: Never used  Substance Use Topics  . Alcohol use: No  . Drug use: No    Review of Systems Patient denies headaches, rhinorrhea, blurry vision, numbness, shortness of breath, chest  pain, edema, cough, abdominal pain, nausea, vomiting, diarrhea, dysuria, fevers, rashes or hallucinations unless otherwise stated above in HPI. ____________________________________________   PHYSICAL EXAM:  VITAL SIGNS: Vitals:   07/04/20 0856  BP: (!) 149/69  Pulse: 79  Resp: (!) 27  Temp: 98.3 F (36.8 C)  SpO2: 100%    Constitutional: Alert in moderate acute resp distress.  Eyes: Conjunctivae are normal.  Head: Atraumatic. Nose: No congestion/rhinnorhea. Mouth/Throat: Mucous membranes are moist.   Neck: No stridor. Painless ROM.  Cardiovascular: Normal rate, regular rhythm. Grossly normal heart sounds.  Good peripheral circulation. Respiratory: tachypnea with diminished bs throughout. Gastrointestinal: Soft and nontender. No distention. No abdominal bruits. No CVA tenderness. Genitourinary: deferred Musculoskeletal: No lower extremity tenderness , 1+ BLE edema.  No joint effusions. Neurologic: No gross focal neurologic deficits are appreciated.  Skin:  Skin is warm, dry and intact. No rash  noted. Psychiatric: anxious appearing____________________________________________   LABS (all labs ordered are listed, but only abnormal results are displayed)  No results found for this or any previous visit (from the past 24 hour(s)). ____________________________________________  EKG My review and personal interpretation at Time: 9:16   Indication: sob  Rate: 75  Rhythm: sinus Axis: normal Other: no stemi, no depressions ____________________________________________  RADIOLOGY  I personally reviewed all radiographic images ordered to evaluate for the above acute complaints and reviewed radiology reports and findings.  These findings were personally discussed with the patient.  Please see medical record for radiology report.  ____________________________________________   PROCEDURES  Procedure(s) performed:  .Critical Care Performed by: Merlyn Lot, MD Authorized by: Merlyn Lot, MD   Critical care provider statement:    Critical care time (minutes):  35   Critical care time was exclusive of:  Separately billable procedures and treating other patients   Critical care was necessary to treat or prevent imminent or life-threatening deterioration of the following conditions:  Respiratory failure   Critical care was time spent personally by me on the following activities:  Development of treatment plan with patient or surrogate, discussions with consultants, evaluation of patient's response to treatment, examination of patient, obtaining history from patient or surrogate, ordering and performing treatments and interventions, ordering and review of laboratory studies, ordering and review of radiographic studies, pulse oximetry, re-evaluation of patient's condition and review of old charts      Critical Care performed: yes ____________________________________________   INITIAL IMPRESSION / [place] / ED COURSE  Pertinent labs & imaging results that were  available during my care of the patient were reviewed by me and considered in my medical decision making (see chart for details).   DDX: Asthma, copd, CHF, pna, ptx, malignancy, Pe, anemia   TASHA JINDRA is a 85 y.o. who presents to the ED with shortness of breath. Patient arrives in acute respiratory failure with hypoxia. Not moving much air on exam therefore was placed on BiPAP on arrival. Exam concerning for worsening congestive heart failure will check blood work monitor.  The patient will be placed on continuous pulse oximetry and telemetry for monitoring.  Laboratory evaluation will be sent to evaluate for the above complaints.     Clinical Course as of Jul 04 1101  Fri Jul 04, 2020  1020 BNP is elevated.  Given chest x-ray showing evidence of worsening CHF will give IV Lasix.   [PR]    Clinical Course User Index [PR] Merlyn Lot, MD   Will require admission to hosp for further medical management.   The patient  was evaluated in Emergency Department today for the symptoms described in the history of present illness. He/she was evaluated in the context of the global COVID-19 pandemic, which necessitated consideration that the patient might be at risk for infection with the SARS-CoV-2 virus that causes COVID-19. Institutional protocols and algorithms that pertain to the evaluation of patients at risk for COVID-19 are in a state of rapid change based on information released by regulatory bodies including the CDC and federal and state organizations. These policies and algorithms were followed during the patient's care in the ED.  As part of my medical decision making, I reviewed the following data within the [facility] notes reviewed and incorporated, Labs reviewed, notes from prior ED visits and [place] Controlled Substance Database   ____________________________________________   FINAL CLINICAL IMPRESSION(S) / ED DIAGNOSES  Final diagnoses:  Acute  respiratory failure with hypoxia (HCC)  Acute on chronic congestive heart failure, unspecified heart failure type ([place])      NEW MEDICATIONS STARTED DURING THIS VISIT:  New Prescriptions   No medications on file     Note:  This document was prepared using Dragon voice recognition software and may include unintentional dictation errors.    Merlyn Lot, MD 07/04/20 [phone]

## 2020-07-04 NOTE — Progress Notes (Signed)
   07/04/20 1500  ReDS Vest / Clip  BMI (Calculated) 26.39  Anatomical Comments NA due to BiPAP

## 2020-07-04 NOTE — ED Triage Notes (Addendum)
Pt arrives via [facility] from home for reports of shob. Pt is hard of hearing and does not answer questions asked by this RN. Pt just stattes, "Can I get a blanket?". Pt 60% on RA, placed on 6L  with no improvement, placed on 15L NRB, 100% at this time. Per EMS pt was 72% on 3L upon arrival. Pt received 125mg  solumedrol in route. Pt with visible labored breathing, skin pale. Pt has DNR form.

## 2020-07-04 NOTE — ED Notes (Signed)
Respiratory at bedside with bipap

## 2020-07-05 DIAGNOSIS — Z794 Long term (current) use of insulin: Secondary | ICD-10-CM

## 2020-07-05 DIAGNOSIS — N184 Chronic kidney disease, stage 4 (severe): Secondary | ICD-10-CM

## 2020-07-05 DIAGNOSIS — E1122 Type 2 diabetes mellitus with diabetic chronic kidney disease: Secondary | ICD-10-CM

## 2020-07-05 DIAGNOSIS — I10 Essential (primary) hypertension: Secondary | ICD-10-CM

## 2020-07-05 DIAGNOSIS — J9601 Acute respiratory failure with hypoxia: Secondary | ICD-10-CM

## 2020-07-05 DIAGNOSIS — D509 Iron deficiency anemia, unspecified: Secondary | ICD-10-CM

## 2020-07-05 LAB — BASIC METABOLIC PANEL
Anion gap: 10 (ref 5–15)
BUN: 44 mg/dL — ABNORMAL HIGH (ref 8–23)
CO2: 30 mmol/L (ref 22–32)
Calcium: 7.6 mg/dL — ABNORMAL LOW (ref 8.9–10.3)
Chloride: 102 mmol/L (ref 98–111)
Creatinine, Ser: 2.73 mg/dL — ABNORMAL HIGH (ref 0.61–1.24)
GFR calc Af Amer: 24 mL/min — ABNORMAL LOW (ref >60)
GFR calc non Af Amer: 21 mL/min — ABNORMAL LOW (ref >60)
Glucose, Bld: 229 mg/dL — ABNORMAL HIGH (ref 70–99)
Potassium: 4.5 mmol/L (ref 3.5–5.1)
Sodium: 142 mmol/L (ref 135–145)

## 2020-07-05 LAB — CBC
HCT: 20.8 % — ABNORMAL LOW (ref 39.0–52.0)
Hemoglobin: 6.4 g/dL — ABNORMAL LOW (ref 13.0–17.0)
MCH: 27.8 pg (ref 26.0–34.0)
MCHC: 30.8 g/dL (ref 30.0–36.0)
MCV: 90.4 fL (ref 80.0–100.0)
Platelets: 100 10*3/uL — ABNORMAL LOW (ref 150–400)
RBC: 2.3 MIL/uL — ABNORMAL LOW (ref 4.22–5.81)
RDW: 17 % — ABNORMAL HIGH (ref 11.5–15.5)
WBC: 8.5 10*3/uL (ref 4.0–10.5)
nRBC: 0 % (ref 0.0–0.2)

## 2020-07-05 LAB — IRON AND TIBC
Iron: 16 ug/dL — ABNORMAL LOW (ref 45–182)
Saturation Ratios: 7 % — ABNORMAL LOW (ref 17.9–39.5)
TIBC: 248 ug/dL — ABNORMAL LOW (ref 250–450)
UIBC: 232 ug/dL

## 2020-07-05 LAB — GLUCOSE, CAPILLARY
Glucose-Capillary: 181 mg/dL — ABNORMAL HIGH (ref 70–99)
Glucose-Capillary: 199 mg/dL — ABNORMAL HIGH (ref 70–99)
Glucose-Capillary: 206 mg/dL — ABNORMAL HIGH (ref 70–99)
Glucose-Capillary: 168 mg/dL — ABNORMAL HIGH (ref 70–99)

## 2020-07-05 LAB — FERRITIN: Ferritin: 117 ng/mL (ref 24–336)

## 2020-07-05 LAB — VITAMIN B12: Vitamin B-12: 267 pg/mL (ref 180–914)

## 2020-07-05 LAB — PREPARE RBC (CROSSMATCH)

## 2020-07-05 LAB — PROCALCITONIN: Procalcitonin: 0.14 ng/mL

## 2020-07-05 MED ORDER — FUROSEMIDE 10 MG/ML IJ SOLN
40.0000 mg | Freq: Two times a day (BID) | INTRAMUSCULAR | Status: DC
  Administered 2020-07-05 – 2020-07-06 (×3): 40 mg via INTRAVENOUS
  Filled 2020-07-05 (×4): qty 4

## 2020-07-05 MED ORDER — SODIUM CHLORIDE 0.9% IV SOLUTION: Freq: Once | INTRAVENOUS | Status: AC

## 2020-07-05 MED ORDER — CARBAMIDE PEROXIDE 6.5 % OT SOLN
5.0000 [drp] | Freq: Two times a day (BID) | OTIC | Status: DC
  Administered 2020-07-05 – 2020-07-11 (×12): 5 [drp] via OTIC
  Filled 2020-07-05: qty 15

## 2020-07-05 NOTE — Progress Notes (Addendum)
Pt had a 1 unit of blood transfusion today and and was done at 4 pm but no recheck hgb/hct order. Notify NP [place]. Will continue to monitor.  Update 2044: Talked to [facility] NP but no new order place and states will just wait for the morning labs to result. Will continue to monitor.

## 2020-07-05 NOTE — Progress Notes (Signed)
[facility]  ROUNDING NOTE   Subjective:   [name] admitted to [facility] on 07/04/2020 for Acute respiratory failure with hypoxia ([place]) [W10.27] Acute diastolic CHF (congestive heart failure) ([place]) [I50.31] Acute on chronic congestive heart failure, unspecified heart failure type ([place]) [I50.9]  Patient is hard of hearing and is unable to understand when spoken too. Nods his head yes to questions that are not yes or no questions.   Placed on IV furosemide.   Objective:  Vital signs in last 24 hours:  Temp:  [97.3 F (36.3 C)-98.2 F (36.8 C)] 97.8 F (36.6 C) (08/07 1234) Pulse Rate:  [33-85] 72 (08/07 1234) Resp:  [14-30] 18 (08/07 1234) BP: (106-145)/(50-67) 106/54 (08/07 1234) SpO2:  [92 %-100 %] 96 % (08/07 1234) FiO2 (%):  [35 %-45 %] 35 % (08/07 0910) Weight:  [82.7 kg-83.4 kg] 82.7 kg (08/07 0343)  Weight change:  Filed Weights   07/04/20 0858 07/04/20 1500 07/05/20 0343  Weight: 86 kg 83.4 kg 82.7 kg    Intake/Output: I/O last 3 completed shifts: In: -  Out: 650 [Urine:650]   Intake/Output this shift:  Total I/O In: 120 [P.O.:120] Out: 400 [Urine:400]  Physical Exam: General: NAD,   Head: Normocephalic, atraumatic. Moist oral mucosal membranes  Eyes: Anicteric, PERRL  Neck: Supple, trachea midline  Lungs:  Clear to auscultation  Heart: Regular rate and rhythm  Abdomen:  Soft, nontender,   Extremities:  no peripheral edema.  Neurologic: Moving all four extremities. +Hard of hearing  Skin: No lesions        Basic Metabolic Panel: Recent Labs  Lab 07/04/20 0912 07/05/20 0438  NA 143 142  K 4.7 4.5  CL 105 102  CO2 28 30  GLUCOSE 124* 229*  BUN 34* 44*  CREATININE 2.54* 2.73*  CALCIUM 7.7* 7.6*    Liver Function Tests: Recent Labs  Lab 07/04/20 0912  AST 17  ALT 13  ALKPHOS 76  BILITOT 0.8  PROT 7.6  ALBUMIN 3.4*   No results for input(s): LIPASE, AMYLASE in the last 168 hours. No results for input(s): AMMONIA  in the last 168 hours.  CBC: Recent Labs  Lab 07/04/20 0912 07/05/20 0438  WBC 11.7* 8.5  NEUTROABS 9.7*  --   HGB 7.3* 6.4*  HCT 24.3* 20.8*  MCV 92.7 90.4  PLT 119* 100*    Cardiac Enzymes: No results for input(s): CKTOTAL, CKMB, CKMBINDEX, TROPONINI in the last 168 hours.  BNP: Invalid input(s): POCBNP  CBG: Recent Labs  Lab 07/04/20 1641 07/04/20 2104 07/05/20 0753 07/05/20 1157  GLUCAP 195* 326* 181* 199*    Microbiology: Results for orders placed or performed during the hospital encounter of 07/04/20  SARS Coronavirus 2 by RT PCR (hospital order, performed in [facility] hospital lab) Nasopharyngeal Nasopharyngeal Swab     Status: None   Collection Time: 07/04/20  9:13 AM   Specimen: Nasopharyngeal Swab  Result Value Ref Range Status   SARS Coronavirus 2 NEGATIVE NEGATIVE Final    Comment: (NOTE) SARS-CoV-2 target nucleic acids are NOT DETECTED.  The SARS-CoV-2 RNA is generally detectable in upper and lower respiratory specimens during the acute phase of infection. The lowest concentration of SARS-CoV-2 viral copies this assay can detect is 250 copies / mL. A negative result does not preclude SARS-CoV-2 infection and should not be used as the sole basis for treatment or other patient management decisions.  A negative result may occur with improper specimen collection / handling, submission of specimen  other than nasopharyngeal swab, presence of viral mutation(s) within the areas targeted by this assay, and inadequate number of viral copies (<250 copies / mL). A negative result must be combined with clinical observations, patient history, and epidemiological information.  Fact Sheet for Patients:   StrictlyIdeas.no  Fact Sheet for Healthcare Providers: BankingDealers.co.za  This test is not yet approved or  cleared by the [place] FDA and has been authorized for detection and/or diagnosis of SARS-CoV-2  by FDA under an Emergency Use Authorization (EUA).  This EUA will remain in effect (meaning this test can be used) for the duration of the COVID-19 declaration under Section 564(b)(1) of the Act, 21 U.S.C. section 360bbb-3(b)(1), unless the authorization is terminated or revoked sooner.  Performed at [facility], [address] [place], [place] [address]     Coagulation Studies: No results for input(s): LABPROT, INR in the last 72 hours.  Urinalysis: No results for input(s): COLORURINE, LABSPEC, PHURINE, GLUCOSEU, HGBUR, BILIRUBINUR, KETONESUR, PROTEINUR, UROBILINOGEN, NITRITE, LEUKOCYTESUR in the last 72 hours.  Invalid input(s): APPERANCEUR    Imaging: DG Chest Portable 1 View  Result Date: 07/04/2020 CLINICAL DATA:  Shortness of breath EXAM: PORTABLE CHEST 1 VIEW COMPARISON:  04/13/2020 FINDINGS: Cardiac shadow remains enlarged. Increased central vascular congestion is noted with small effusions bilaterally. Mild parenchymal edema is noted as well. IMPRESSION: Increasing CHF. Electronically Signed   By: Inez Catalina M.D.   On: 07/04/2020 09:27     Medications:   . sodium chloride     . amLODipine  5 mg Oral Daily  . carbamide peroxide  5 drop Both EARS BID  . clopidogrel  75 mg Oral Daily  . ferrous sulfate  325 mg Oral BID WC  . furosemide  40 mg Intravenous BID  . gabapentin  100 mg Oral BID  . insulin aspart  0-5 Units Subcutaneous QHS  . insulin aspart  0-9 Units Subcutaneous TID WC  . isosorbide mononitrate  30 mg Oral Daily  . metoprolol  200 mg Oral Daily  . mometasone-formoterol  2 puff Inhalation BID  . pantoprazole  40 mg Oral Daily  . polyethylene glycol  17 g Oral Daily  . rosuvastatin  40 mg Oral Daily  . sodium chloride flush  3 mL Intravenous Q12H  . tamsulosin  0.4 mg Oral QPC supper  . tiotropium  18 mcg Inhalation Daily  . Vitamin D (Ergocalciferol)  50,000 Units Oral Weekly   sodium chloride, acetaminophen, ipratropium-albuterol,  ondansetron (ZOFRAN) IV, sodium chloride flush  Assessment/ Plan:  [name] is a 85 y.o. white male with B-cell lymphoma, aortic regurgitation, coronary artery disease, congestive heart failure, diabetes mellitus type II, COPD, hypertension, peptic ulcer disease, peripheral vascular disease and hearing loss who was admitted to [facility] on 07/04/2020 for Acute respiratory failure with hypoxia ([place]) [K27.06] Acute diastolic CHF (congestive heart failure) ([place]) [I50.31] Acute on chronic congestive heart failure, unspecified heart failure type ([place]) [I50.9]  1. Acute renal failure on chronic kidney disease stage IIIB with proteinuria: baseline creatinine of 1.79, GFR of 35 on 12/05/19. However suspect some level of progression of chronic kidney disease Chronic Kidney disease secondary to diabetes and hypertension Acute renal failure secondary to acute cardiorenal syndrome.  No acute indication for dialysis.   2. Hypertension with acute exacerbation of diastolic congestive heart failure.  - IV furosemide - Continue home regimen of amlodipine, isosorbide mononitrate, metoprolol and tamsulosin.   3. Anemia with chronic kidney disease: normocytic. Hemoglobin 6.4 Not  currently a candidate for ESA with malignancy.  With thrombocytopenia - PRBC transfusion scheduled.   4. Diabetes mellitus type II with chronic kidney disease: insulin dependent. Hemoglobin A1c of 5.2%.    LOS: 1 Azim Gillingham 8/7/202112:37 PM

## 2020-07-05 NOTE — Plan of Care (Signed)
?  Problem: Clinical Measurements: ?Goal: Ability to maintain clinical measurements within normal limits will improve ?Outcome: Progressing ?Goal: Respiratory complications will improve ?Outcome: Progressing ?  ?Problem: Pain Managment: ?Goal: General experience of comfort will improve ?Outcome: Progressing ?  ?Problem: Safety: ?Goal: Ability to remain free from injury will improve ?Outcome: Progressing ?  ?

## 2020-07-05 NOTE — Progress Notes (Deleted)
SATURATION QUALIFICATIONS: (This note is used to comply with regulatory documentation for home oxygen)  Patient Saturations on Room Air at Rest = 85 %  Patient Saturations on Room Air while Ambulating =0 %  Patient Saturations on 0 Liters of oxygen while Ambulating =0 %  Please briefly explain why patient needs home oxygen: Pt oxygen at 85% RA.

## 2020-07-05 NOTE — TOC Progression Note (Signed)
Transition of Care [facility]) - Progression Note    Patient Details  Name: Brett [name] MRN: [id number] Date of Birth: [birth year]  Transition of Care [facility]) CM/SW Contact  Zigmund Daniel Dorian Pod, RN Phone [place] 07/05/2020, 6:15 PM  Clinical Narrative:     RN visited pt at bedside no family available. Pt HOH/deaf. Therefore RN attempted to call caregiver Claudette Head [phone]) for HF screening/education as ordered and to completed the high risk screen however unsuccessful and unable to leave a HIPAA voice message to request a call back with no voice mail set up. Requested floor RN to place HF information in packet.  TOC will continue to follow.       Expected Discharge Plan and Services                                                 Social Determinants of Health (SDOH) Interventions    Readmission Risk Interventions Readmission Risk Prevention Plan 07/05/2020 04/09/2020 12/05/2019  Transportation Screening - Complete -  Medication Review [employer]) - Referral to Pharmacy -  PCP or Specialist appointment within 3-5 days of discharge - Complete Complete  HRI or Home Care Consult - Complete (No Data)  SW Recovery Care/Counseling Consult - - Complete  Palliative Care Screening Not Applicable Complete Not Applicable  Skilled Nursing Facility Not Applicable Complete Complete  Some recent data might be hidden

## 2020-07-05 NOTE — Progress Notes (Signed)
Patient ID: Brett [name], male   DOB: [birth year], 85 y.o.   MRN: [id number] [facility] PROGRESS NOTE  Brett [name] DOB: 03/16/1935 DOA: 07/04/2020 PCP: Perrin Maltese, MD  HPI/Subjective: Patient very hard of hearing and could not even respond to my questions.  He stated he feels okay.  Had some shortness of breath coming in.  He cannot hear very well and he stated he cannot afford a hearing aid.  Objective: Vitals:   07/05/20 1234 07/05/20 1300  BP: (!) 106/54 (!) 108/55  Pulse: 72 70  Resp: 18 19  Temp: 97.8 F (36.6 C) 97.9 F (36.6 C)  SpO2: 96% 99%    Filed Weights   07/04/20 0858 07/04/20 1500 07/05/20 0343  Weight: 86 kg 83.4 kg 82.7 kg    ROS: Review of Systems  Unable to perform ROS: Acuity of condition  Constitutional: Positive for malaise/fatigue.  Respiratory: Negative for shortness of breath.    Exam: Physical Exam HENT:     Ears:     Comments: Tympanic membrane obscured by wax bilaterally    Nose: No mucosal edema.     Mouth/Throat:     Pharynx: No oropharyngeal exudate.  Eyes:     General: Lids are normal.     Conjunctiva/sclera: Conjunctivae normal.  Cardiovascular:     Rate and Rhythm: Normal rate and regular rhythm.     Heart sounds: Normal heart sounds, S1 normal and S2 normal.  Pulmonary:     Breath sounds: Examination of the right-lower field reveals decreased breath sounds and rales. Examination of the left-lower field reveals decreased breath sounds and rales. Decreased breath sounds and rales present. No wheezing or rhonchi.  Abdominal:     Palpations: Abdomen is soft.     Tenderness: There is no abdominal tenderness.  Musculoskeletal:     Right ankle: Swelling present.     Left ankle: Swelling present.  Skin:    General: Skin is warm.     Findings: No rash.  Neurological:     Mental Status: He is alert.       Data Reviewed: Basic Metabolic Panel: Recent Labs  Lab 07/04/20 0912 07/05/20 0438  NA  143 142  K 4.7 4.5  CL 105 102  CO2 28 30  GLUCOSE 124* 229*  BUN 34* 44*  CREATININE 2.54* 2.73*  CALCIUM 7.7* 7.6*   Liver Function Tests: Recent Labs  Lab 07/04/20 0912  AST 17  ALT 13  ALKPHOS 76  BILITOT 0.8  PROT 7.6  ALBUMIN 3.4*   No results for input(s): LIPASE, AMYLASE in the last 168 hours. No results for input(s): AMMONIA in the last 168 hours. CBC: Recent Labs  Lab 07/04/20 0912 07/05/20 0438  WBC 11.7* 8.5  NEUTROABS 9.7*  --   HGB 7.3* 6.4*  HCT 24.3* 20.8*  MCV 92.7 90.4  PLT 119* 100*   BNP (last 3 results) Recent Labs    04/04/20 0446 04/10/20 0418 07/04/20 0912  BNP 591.0* 301.0* 816.6*    CBG: Recent Labs  Lab 07/04/20 1641 07/04/20 2104 07/05/20 0753 07/05/20 1157  GLUCAP 195* 326* 181* 199*    Recent Results (from the past 240 hour(s))  SARS Coronavirus 2 by RT PCR (hospital order, performed in [facility] hospital lab) Nasopharyngeal Nasopharyngeal Swab     Status: None   Collection Time: 07/04/20  9:13 AM   Specimen: Nasopharyngeal Swab  Result Value Ref Range Status   SARS Coronavirus 2 NEGATIVE NEGATIVE Final  Comment: (NOTE) SARS-CoV-2 target nucleic acids are NOT DETECTED.  The SARS-CoV-2 RNA is generally detectable in upper and lower respiratory specimens during the acute phase of infection. The lowest concentration of SARS-CoV-2 viral copies this assay can detect is 250 copies / mL. A negative result does not preclude SARS-CoV-2 infection and should not be used as the sole basis for treatment or other patient management decisions.  A negative result may occur with improper specimen collection / handling, submission of specimen other than nasopharyngeal swab, presence of viral mutation(s) within the areas targeted by this assay, and inadequate number of viral copies (<250 copies / mL). A negative result must be combined with clinical observations, patient history, and epidemiological information.  Fact Sheet for  Patients:   StrictlyIdeas.no  Fact Sheet for Healthcare Providers: BankingDealers.co.za  This test is not yet approved or  cleared by the [place] FDA and has been authorized for detection and/or diagnosis of SARS-CoV-2 by FDA under an Emergency Use Authorization (EUA).  This EUA will remain in effect (meaning this test can be used) for the duration of the COVID-19 declaration under Section 564(b)(1) of the Act, 21 U.S.C. section 360bbb-3(b)(1), unless the authorization is terminated or revoked sooner.  Performed at [facility], [address] [place], [place] [address]      Studies: DG Chest Portable 1 View  Result Date: 07/04/2020 CLINICAL DATA:  Shortness of breath EXAM: PORTABLE CHEST 1 VIEW COMPARISON:  04/13/2020 FINDINGS: Cardiac shadow remains enlarged. Increased central vascular congestion is noted with small effusions bilaterally. Mild parenchymal edema is noted as well. IMPRESSION: Increasing CHF. Electronically Signed   By: Inez Catalina M.D.   On: 07/04/2020 09:27    Scheduled Meds: . amLODipine  5 mg Oral Daily  . carbamide peroxide  5 drop Both EARS BID  . clopidogrel  75 mg Oral Daily  . ferrous sulfate  325 mg Oral BID WC  . furosemide  40 mg Intravenous BID  . gabapentin  100 mg Oral BID  . insulin aspart  0-5 Units Subcutaneous QHS  . insulin aspart  0-9 Units Subcutaneous TID WC  . isosorbide mononitrate  30 mg Oral Daily  . metoprolol  200 mg Oral Daily  . mometasone-formoterol  2 puff Inhalation BID  . pantoprazole  40 mg Oral Daily  . polyethylene glycol  17 g Oral Daily  . rosuvastatin  40 mg Oral Daily  . sodium chloride flush  3 mL Intravenous Q12H  . tamsulosin  0.4 mg Oral QPC supper  . tiotropium  18 mcg Inhalation Daily  . Vitamin D (Ergocalciferol)  50,000 Units Oral Weekly   Continuous Infusions: . sodium chloride      Assessment/Plan:  1. Acute hypoxic respiratory failure  on presentation requiring BiPAP.  Currently on 4 L of oxygen saturating well hopefully can taper this down. 2. Acute on chronic diastolic congestive heart failure.  Lasix 40 mg IV every 12 hours. 3. Iron deficiency anemia with hemoglobin dipping down to 6.4.  Patient receiving a unit of packed red blood cells today.  Recheck hemoglobin tomorrow.  I likely will give IV iron tomorrow. 4. Paroxysmal atrial fibrillation.  Metoprolol for rate control. 5. Type 2 diabetes mellitus with chronic kidney disease stage IV.  Careful with diuresis. 6. Essential hypertension on amlodipine 7. History of CAD on Plavix and metoprolol 8. Cerumen impaction.  Tympanic membrane obscured by wax.  Will give Debrox otic solution twice daily both ears 9. Weakness.  Physical therapy  evaluation        Code Status:     Code Status Orders  (From admission, onward)         Start     Ordered   07/04/20 1158  Do not attempt resuscitation (DNR)  Continuous       Question Answer Comment  In the event of cardiac or respiratory ARREST Do not call a "code blue"   In the event of cardiac or respiratory ARREST Do not perform Intubation, CPR, defibrillation or ACLS   In the event of cardiac or respiratory ARREST Use medication by any route, position, wound care, and other measures to relive pain and suffering. May use oxygen, suction and manual treatment of airway obstruction as needed for comfort.      07/04/20 1159        Code Status History    Date Active Date Inactive Code Status Order ID Comments User Context   04/04/2020 1514 04/16/2020 0357 DNR [id number]  Ivor Costa, MD Inpatient   04/04/2020 0845 04/04/2020 1514 DNR [id number]  Ivor Costa, MD ED   11/26/2019 1352 12/06/2019 0007 DNR [id number]  Toy Baker, MD ED   11/25/2019 2254 11/26/2019 1352 DNR [id number]  Toy Baker, MD ED   07/14/2019 1007 07/16/2019 1444 Full Code [id number]  Loletha Grayer, MD ED   05/23/2019 1908 05/25/2019 1443 DNR [id number]   Myra Rude, RN Inpatient   05/22/2019 1357 05/23/2019 1908 Full Code [id number]  Loletha Grayer, MD ED   02/09/2019 0410 02/12/2019 1734 Full Code [id number]  Arta Silence, MD Inpatient   01/21/2019 1852 01/23/2019 1954 DNR [id number]  Henreitta Leber, MD Inpatient   08/24/2018 0149 08/25/2018 1536 DNR [id number]  Amelia Jo, MD Inpatient   05/29/2018 1513 05/31/2018 1617 DNR [id number]  Gladstone Lighter, MD Inpatient   12/30/2017 1815 01/01/2018 1453 Full Code [id number]  Vaughan Basta, MD Inpatient   12/05/2017 2146 12/08/2017 1428 Full Code [id number]  Fritzi Mandes, MD Inpatient   11/13/2017 2123 11/16/2017 1704 Full Code [id number]  Idelle Crouch, MD Inpatient   09/22/2017 0036 09/24/2017 1837 Full Code [id number]  Salary, Avel Peace, MD Inpatient   09/22/2017 0033 09/22/2017 0036 DNR [id number]  Gorden Harms, MD ED   12/02/2016 0812 12/03/2016 2009 Full Code [id number]  Vaughan Basta, MD Inpatient   07/11/2016 0018 07/11/2016 1707 Full Code [id number]  Lance Coon, MD Inpatient   06/18/2016 0340 06/21/2016 1844 Full Code [id number]  Saundra Shelling, MD ED   Advance Care Planning Activity    Advance Directive Documentation     Most Recent Value  Type of Advance Directive Out of facility DNR (pink MOST or yellow form)  Pre-existing out of facility DNR order (yellow form or pink MOST form) Physician notified to receive inpatient order  "MOST" Form in Place? --     Family Communication: Spoke with caretaker on the phone Disposition Plan: Status is: Inpatient  Dispo: The patient is from: Home              Anticipated d/c is to: Home              Anticipated d/c date is: Will need likely a few days here in the hospital managing CHF and anemia.              Patient currently being treated for congestive heart failure and transfusion today for anemia  Consultants:  Nephrology  Time spent: 28 minutes  [place]

## 2020-07-06 DIAGNOSIS — J9621 Acute and chronic respiratory failure with hypoxia: Secondary | ICD-10-CM

## 2020-07-06 DIAGNOSIS — H6123 Impacted cerumen, bilateral: Secondary | ICD-10-CM

## 2020-07-06 LAB — TYPE AND SCREEN
ABO/RH(D): B POS
Antibody Screen: NEGATIVE
Unit division: 0

## 2020-07-06 LAB — BASIC METABOLIC PANEL
Anion gap: 12 (ref 5–15)
BUN: 61 mg/dL — ABNORMAL HIGH (ref 8–23)
CO2: 32 mmol/L (ref 22–32)
Calcium: 7.5 mg/dL — ABNORMAL LOW (ref 8.9–10.3)
Chloride: 97 mmol/L — ABNORMAL LOW (ref 98–111)
Creatinine, Ser: 2.88 mg/dL — ABNORMAL HIGH (ref 0.61–1.24)
GFR calc Af Amer: 22 mL/min — ABNORMAL LOW (ref >60)
GFR calc non Af Amer: 19 mL/min — ABNORMAL LOW (ref >60)
Glucose, Bld: 95 mg/dL (ref 70–99)
Potassium: 3.8 mmol/L (ref 3.5–5.1)
Sodium: 141 mmol/L (ref 135–145)

## 2020-07-06 LAB — CBC
HCT: 23.9 % — ABNORMAL LOW (ref 39.0–52.0)
Hemoglobin: 7.3 g/dL — ABNORMAL LOW (ref 13.0–17.0)
MCH: 27.8 pg (ref 26.0–34.0)
MCHC: 30.5 g/dL (ref 30.0–36.0)
MCV: 90.9 fL (ref 80.0–100.0)
Platelets: 127 10*3/uL — ABNORMAL LOW (ref 150–400)
RBC: 2.63 MIL/uL — ABNORMAL LOW (ref 4.22–5.81)
RDW: 16.4 % — ABNORMAL HIGH (ref 11.5–15.5)
WBC: 8.3 10*3/uL (ref 4.0–10.5)
nRBC: 0 % (ref 0.0–0.2)

## 2020-07-06 LAB — BPAM RBC
Blood Product Expiration Date: 202108282359
ISSUE DATE / TIME: 202108071240
Unit Type and Rh: 7300

## 2020-07-06 LAB — PROCALCITONIN: Procalcitonin: 0.11 ng/mL

## 2020-07-06 LAB — GLUCOSE, CAPILLARY
Glucose-Capillary: 82 mg/dL (ref 70–99)
Glucose-Capillary: 132 mg/dL — ABNORMAL HIGH (ref 70–99)
Glucose-Capillary: 108 mg/dL — ABNORMAL HIGH (ref 70–99)
Glucose-Capillary: 157 mg/dL — ABNORMAL HIGH (ref 70–99)

## 2020-07-06 LAB — OCCULT BLOOD X 1 CARD TO LAB, STOOL: Fecal Occult Bld: POSITIVE — AB

## 2020-07-06 MED ORDER — VITAMIN B-12 1000 MCG PO TABS
1000.0000 ug | ORAL_TABLET | Freq: Every day | ORAL | Status: DC
  Administered 2020-07-06 – 2020-07-11 (×6): 1000 ug via ORAL
  Filled 2020-07-06 (×6): qty 1

## 2020-07-06 MED ORDER — SODIUM CHLORIDE 0.9 % IV SOLN
400.0000 mg | Freq: Once | INTRAVENOUS | Status: AC
  Administered 2020-07-06: 400 mg via INTRAVENOUS
  Filled 2020-07-06: qty 20

## 2020-07-06 NOTE — Plan of Care (Signed)
  Problem: Education: Goal: Knowledge of General Education information will improve Description: Including pain rating scale, medication(s)/side effects and non-pharmacologic comfort measures Outcome: Progressing   Problem: Clinical Measurements: Goal: Respiratory complications will improve Outcome: Progressing   Problem: Safety: Goal: Ability to remain free from injury will improve Outcome: Progressing   

## 2020-07-06 NOTE — Progress Notes (Signed)
PT Cancellation Note  Patient Details Name: Brett [name] MRN: [id number] DOB: 05/13/1935   Cancelled Treatment:    Reason Eval/Treat Not Completed: Medical issues which prohibited therapy, O2 saturation is 86% supine. Notified nsg.    Arelia Sneddon S, PT DPT 07/06/2020, 10:00 AM

## 2020-07-06 NOTE — Progress Notes (Signed)
bipap declined 

## 2020-07-06 NOTE — Progress Notes (Signed)
Patient ID: Brett [name], male   DOB: 1935-10-06, 85 y.o.   MRN: [id number] [facility] PROGRESS NOTE  Brett [name] ZYS:[id number] DOB: [birth year] DOA: 07/04/2020 PCP: Perrin Maltese, MD  HPI/Subjective: Patient able to hear me better today than yesterday after starting Debrox otic solution for his cerumen impaction.  He hears better out of his left ear.  He states he feels okay.  He states that he gets some sweating when he walks.  Objective: Vitals:   07/06/20 1019 07/06/20 1109  BP:  (!) 107/55  Pulse: 82 72  Resp:  17  Temp:  98.3 F (36.8 C)  SpO2:  95%    Intake/Output Summary (Last 24 hours) at 07/06/2020 1421 Last data filed at 07/06/2020 1134 Gross per 24 hour  Intake 130 ml  Output 2800 ml  Net -2670 ml   Filed Weights   07/04/20 1500 07/05/20 0343 07/06/20 0434  Weight: 83.4 kg 82.7 kg 82.1 kg    ROS: Review of Systems  Respiratory: Negative for shortness of breath.   Cardiovascular: Negative for chest pain.  Gastrointestinal: Negative for abdominal pain.   Exam: Physical Exam HENT:     Nose: No mucosal edema.  Eyes:     General: Lids are normal.     Conjunctiva/sclera: Conjunctivae normal.     Pupils: Pupils are equal, round, and reactive to light.  Cardiovascular:     Rate and Rhythm: Normal rate and regular rhythm.     Heart sounds: Normal heart sounds, S1 normal and S2 normal.  Pulmonary:     Breath sounds: Examination of the right-middle field reveals decreased breath sounds. Examination of the left-middle field reveals decreased breath sounds. Examination of the right-lower field reveals decreased breath sounds and rales. Examination of the left-lower field reveals decreased breath sounds and rales. Decreased breath sounds and rales present. No wheezing or rhonchi.  Abdominal:     Palpations: Abdomen is soft.     Tenderness: There is no abdominal tenderness.  Musculoskeletal:     Right ankle: Swelling present.     Left ankle: Swelling  present.  Skin:    General: Skin is warm.     Findings: No rash.  Neurological:     Mental Status: He is alert.       Data Reviewed: Basic Metabolic Panel: Recent Labs  Lab 07/04/20 0912 07/05/20 0438 07/06/20 0705  NA 143 142 141  K 4.7 4.5 3.8  CL 105 102 97*  CO2 28 30 32  GLUCOSE 124* 229* 95  BUN 34* 44* 61*  CREATININE 2.54* 2.73* 2.88*  CALCIUM 7.7* 7.6* 7.5*   Liver Function Tests: Recent Labs  Lab 07/04/20 0912  AST 17  ALT 13  ALKPHOS 76  BILITOT 0.8  PROT 7.6  ALBUMIN 3.4*   CBC: Recent Labs  Lab 07/04/20 0912 07/05/20 0438 07/06/20 0705  WBC 11.7* 8.5 8.3  NEUTROABS 9.7*  --   --   HGB 7.3* 6.4* 7.3*  HCT 24.3* 20.8* 23.9*  MCV 92.7 90.4 90.9  PLT 119* 100* 127*   BNP (last 3 results) Recent Labs    04/04/20 0446 04/10/20 0418 07/04/20 0912  BNP 591.0* 301.0* 816.6*    CBG: Recent Labs  Lab 07/05/20 1157 07/05/20 1703 07/05/20 2035 07/06/20 0810 07/06/20 1159  GLUCAP 199* 206* 168* 82 132*    Recent Results (from the past 240 hour(s))  SARS Coronavirus 2 by RT PCR (hospital order, performed in [facility] hospital lab) Nasopharyngeal Nasopharyngeal  Swab     Status: None   Collection Time: 07/04/20  9:13 AM   Specimen: Nasopharyngeal Swab  Result Value Ref Range Status   SARS Coronavirus 2 NEGATIVE NEGATIVE Final    Comment: (NOTE) SARS-CoV-2 target nucleic acids are NOT DETECTED.  The SARS-CoV-2 RNA is generally detectable in upper and lower respiratory specimens during the acute phase of infection. The lowest concentration of SARS-CoV-2 viral copies this assay can detect is 250 copies / mL. A negative result does not preclude SARS-CoV-2 infection and should not be used as the sole basis for treatment or other patient management decisions.  A negative result may occur with improper specimen collection / handling, submission of specimen other than nasopharyngeal swab, presence of viral mutation(s) within the areas  targeted by this assay, and inadequate number of viral copies (<250 copies / mL). A negative result must be combined with clinical observations, patient history, and epidemiological information.  Fact Sheet for Patients:   StrictlyIdeas.no  Fact Sheet for Healthcare Providers: BankingDealers.co.za  This test is not yet approved or  cleared by the [place] FDA and has been authorized for detection and/or diagnosis of SARS-CoV-2 by FDA under an Emergency Use Authorization (EUA).  This EUA will remain in effect (meaning this test can be used) for the duration of the COVID-19 declaration under Section 564(b)(1) of the Act, 21 U.S.C. section 360bbb-3(b)(1), unless the authorization is terminated or revoked sooner.  Performed at [facility], [address] [place], [place] [address]       Scheduled Meds: . amLODipine  5 mg Oral Daily  . carbamide peroxide  5 drop Both EARS BID  . clopidogrel  75 mg Oral Daily  . ferrous sulfate  325 mg Oral BID WC  . furosemide  40 mg Intravenous BID  . gabapentin  100 mg Oral BID  . insulin aspart  0-5 Units Subcutaneous QHS  . insulin aspart  0-9 Units Subcutaneous TID WC  . isosorbide mononitrate  30 mg Oral Daily  . metoprolol  200 mg Oral Daily  . mometasone-formoterol  2 puff Inhalation BID  . pantoprazole  40 mg Oral Daily  . polyethylene glycol  17 g Oral Daily  . rosuvastatin  40 mg Oral Daily  . sodium chloride flush  3 mL Intravenous Q12H  . tamsulosin  0.4 mg Oral QPC supper  . tiotropium  18 mcg Inhalation Daily  . Vitamin D (Ergocalciferol)  50,000 Units Oral Weekly   Continuous Infusions: . sodium chloride      Assessment/Plan:  1. Acute on chronic hypoxic respiratory failure on presentation requiring BiPAP.  Currently on 3 L of oxygen. 2. Acute diastolic congestive heart failure.  Lasix 40 mg IV twice a day.  Urinating well. 3. Iron deficiency anemia.   Hemoglobin responded to unit of packed red blood cells up to 7.3 today.  Give IV iron today.  Since patient is guaiac positive I will hold Plavix.  B12 slightly low will give oral B12 supplementation. 4. Paroxysmal atrial fibrillation.  Metoprolol for rate control. 5. Type 2 diabetes mellitus with chronic kidney disease stage IV.  Continue to monitor closely with diuresis 6. Essential hypertension on amlodipine and metoprolol 7. History of CAD and pulmonary hypertension.  Hold Plavix with guaiac positive stool and anemia.  Continue metoprolol. 8. Cerumen impaction.  Tympanic membrane obscured by wax.  Trial of Debrox otic solution.  Patient able to hear a little bit better today 9. Weakness.  Physical therapy evaluation  Code Status:     Code Status Orders  (From admission, onward)         Start     Ordered   07/04/20 1158  Do not attempt resuscitation (DNR)  Continuous       Question Answer Comment  In the event of cardiac or respiratory ARREST Do not call a "code blue"   In the event of cardiac or respiratory ARREST Do not perform Intubation, CPR, defibrillation or ACLS   In the event of cardiac or respiratory ARREST Use medication by any route, position, wound care, and other measures to relive pain and suffering. May use oxygen, suction and manual treatment of airway obstruction as needed for comfort.      07/04/20 1159        Code Status History    Date Active Date Inactive Code Status Order ID Comments User Context   04/04/2020 1514 04/16/2020 0357 DNR [id number]  Ivor Costa, MD Inpatient   04/04/2020 0845 04/04/2020 1514 DNR [id number]  Ivor Costa, MD ED   11/26/2019 1352 12/06/2019 0007 DNR [id number]  Toy Baker, MD ED   11/25/2019 2254 11/26/2019 1352 DNR [id number]  Toy Baker, MD ED   07/14/2019 1007 07/16/2019 1444 Full Code [id number]  Loletha Grayer, MD ED   05/23/2019 1908 05/25/2019 1443 DNR [id number]  Myra Rude, RN Inpatient   05/22/2019 1357  05/23/2019 1908 Full Code [id number]  Loletha Grayer, MD ED   02/09/2019 0410 02/12/2019 1734 Full Code [id number]  Arta Silence, MD Inpatient   01/21/2019 1852 01/23/2019 1954 DNR [id number]  Henreitta Leber, MD Inpatient   08/24/2018 0149 08/25/2018 1536 DNR [id number]  Amelia Jo, MD Inpatient   05/29/2018 1513 05/31/2018 1617 DNR [id number]  Gladstone Lighter, MD Inpatient   12/30/2017 1815 01/01/2018 1453 Full Code [id number]  Vaughan Basta, MD Inpatient   12/05/2017 2146 12/08/2017 1428 Full Code [id number]  Fritzi Mandes, MD Inpatient   11/13/2017 2123 11/16/2017 1704 Full Code [id number]  Idelle Crouch, MD Inpatient   09/22/2017 0036 09/24/2017 1837 Full Code [id number]  Salary, Avel Peace, MD Inpatient   09/22/2017 0033 09/22/2017 0036 DNR [id number]  Gorden Harms, MD ED   12/02/2016 0812 12/03/2016 2009 Full Code [id number]  Vaughan Basta, MD Inpatient   07/11/2016 0018 07/11/2016 1707 Full Code [id number]  Lance Coon, MD Inpatient   06/18/2016 0340 06/21/2016 1844 Full Code [id number]  Saundra Shelling, MD ED   Advance Care Planning Activity    Advance Directive Documentation     Most Recent Value  Type of Advance Directive Out of facility DNR (pink MOST or yellow form)  Pre-existing out of facility DNR order (yellow form or pink MOST form) Physician notified to receive inpatient order  "MOST" Form in Place? --     Family Communication: Spoke with patient's caregiver on the phone Disposition Plan: Status is: Inpatient  Dispo: The patient is from: Home              Anticipated d/c is to: Home              Anticipated d/c date is: Likely will need a few more days of diuresis at this point.              Patient currently being treated for anemia.  May end up needing another transfusion during the hospital course.  Patient also being treated for congestive heart failure requiring IV diuresis.  Consultants:  Nephrology  Time spent: 28 minutes  Shakiyla [employer]  [facility]

## 2020-07-06 NOTE — Progress Notes (Signed)
[facility]  ROUNDING NOTE   Subjective:   UOP 2600.  furosemide 40mg  IV bid.   PRBC transfusion yesterday.   Patient is hard of hearing and is unable to understand when spoken to.  Objective:  Vital signs in last 24 hours:  Temp:  [97.5 F (36.4 C)-98.3 F (36.8 C)] 98.3 F (36.8 C) (08/08 1109) Pulse Rate:  [65-82] 72 (08/08 1109) Resp:  [16-20] 17 (08/08 1109) BP: (102-118)/(52-59) 107/55 (08/08 1109) SpO2:  [90 %-100 %] 95 % (08/08 1109) Weight:  [82.1 kg] 82.1 kg (08/08 0434)  Weight change: -3.901 kg Filed Weights   07/04/20 1500 07/05/20 0343 07/06/20 0434  Weight: 83.4 kg 82.7 kg 82.1 kg    Intake/Output: I/O last 3 completed shifts: In: 250 [P.O.:240; I.V.:10] Out: 2600 [Urine:2600]   Intake/Output this shift:  Total I/O In: -  Out: 600 [Urine:600]  Physical Exam: General: NAD,   Head: Normocephalic, atraumatic. Moist oral mucosal membranes  Eyes: Anicteric, PERRL  Neck: Supple, trachea midline  Lungs:  Clear to auscultation  Heart: Regular rate and rhythm  Abdomen:  Soft, nontender,   Extremities:  no peripheral edema.  Neurologic: Moving all four extremities. +Hard of hearing  Skin: No lesions        Basic Metabolic Panel: Recent Labs  Lab 07/04/20 0912 07/05/20 0438 07/06/20 0705  NA 143 142 141  K 4.7 4.5 3.8  CL 105 102 97*  CO2 28 30 32  GLUCOSE 124* 229* 95  BUN 34* 44* 61*  CREATININE 2.54* 2.73* 2.88*  CALCIUM 7.7* 7.6* 7.5*    Liver Function Tests: Recent Labs  Lab 07/04/20 0912  AST 17  ALT 13  ALKPHOS 76  BILITOT 0.8  PROT 7.6  ALBUMIN 3.4*   No results for input(s): LIPASE, AMYLASE in the last 168 hours. No results for input(s): AMMONIA in the last 168 hours.  CBC: Recent Labs  Lab 07/04/20 0912 07/05/20 0438 07/06/20 0705  WBC 11.7* 8.5 8.3  NEUTROABS 9.7*  --   --   HGB 7.3* 6.4* 7.3*  HCT 24.3* 20.8* 23.9*  MCV 92.7 90.4 90.9  PLT 119* 100* 127*    Cardiac Enzymes: No results for  input(s): CKTOTAL, CKMB, CKMBINDEX, TROPONINI in the last 168 hours.  BNP: Invalid input(s): POCBNP  CBG: Recent Labs  Lab 07/05/20 1157 07/05/20 1703 07/05/20 2035 07/06/20 0810 07/06/20 1159  GLUCAP 199* 206* 168* 82 132*    Microbiology: Results for orders placed or performed during the hospital encounter of 07/04/20  SARS Coronavirus 2 by RT PCR (hospital order, performed in [facility] hospital lab) Nasopharyngeal Nasopharyngeal Swab     Status: None   Collection Time: 07/04/20  9:13 AM   Specimen: Nasopharyngeal Swab  Result Value Ref Range Status   SARS Coronavirus 2 NEGATIVE NEGATIVE Final    Comment: (NOTE) SARS-CoV-2 target nucleic acids are NOT DETECTED.  The SARS-CoV-2 RNA is generally detectable in upper and lower respiratory specimens during the acute phase of infection. The lowest concentration of SARS-CoV-2 viral copies this assay can detect is 250 copies / mL. A negative result does not preclude SARS-CoV-2 infection and should not be used as the sole basis for treatment or other patient management decisions.  A negative result may occur with improper specimen collection / handling, submission of specimen other than nasopharyngeal swab, presence of viral mutation(s) within the areas targeted by this assay, and inadequate number of viral copies (<250 copies / mL). A negative result must be  combined with clinical observations, patient history, and epidemiological information.  Fact Sheet for Patients:   StrictlyIdeas.no  Fact Sheet for Healthcare Providers: BankingDealers.co.za  This test is not yet approved or  cleared by the [place] FDA and has been authorized for detection and/or diagnosis of SARS-CoV-2 by FDA under an Emergency Use Authorization (EUA).  This EUA will remain in effect (meaning this test can be used) for the duration of the COVID-19 declaration under Section 564(b)(1) of the Act, 21  U.S.C. section 360bbb-3(b)(1), unless the authorization is terminated or revoked sooner.  Performed at [facility], [address] [place], [place] [address]     Coagulation Studies: No results for input(s): LABPROT, INR in the last 72 hours.  Urinalysis: No results for input(s): COLORURINE, LABSPEC, PHURINE, GLUCOSEU, HGBUR, BILIRUBINUR, KETONESUR, PROTEINUR, UROBILINOGEN, NITRITE, LEUKOCYTESUR in the last 72 hours.  Invalid input(s): APPERANCEUR    Imaging: No results found.   Medications:   . sodium chloride    . iron sucrose 400 mg (07/06/20 1033)   . amLODipine  5 mg Oral Daily  . carbamide peroxide  5 drop Both EARS BID  . clopidogrel  75 mg Oral Daily  . ferrous sulfate  325 mg Oral BID WC  . furosemide  40 mg Intravenous BID  . gabapentin  100 mg Oral BID  . insulin aspart  0-5 Units Subcutaneous QHS  . insulin aspart  0-9 Units Subcutaneous TID WC  . isosorbide mononitrate  30 mg Oral Daily  . metoprolol  200 mg Oral Daily  . mometasone-formoterol  2 puff Inhalation BID  . pantoprazole  40 mg Oral Daily  . polyethylene glycol  17 g Oral Daily  . rosuvastatin  40 mg Oral Daily  . sodium chloride flush  3 mL Intravenous Q12H  . tamsulosin  0.4 mg Oral QPC supper  . tiotropium  18 mcg Inhalation Daily  . Vitamin D (Ergocalciferol)  50,000 Units Oral Weekly   sodium chloride, acetaminophen, ipratropium-albuterol, ondansetron (ZOFRAN) IV, sodium chloride flush  Assessment/ Plan:  [name] is a 85 y.o. white male with B-cell lymphoma, aortic regurgitation, coronary artery disease, congestive heart failure, diabetes mellitus type II, COPD, hypertension, peptic ulcer disease, peripheral vascular disease and hearing loss who was admitted to [facility] on 07/04/2020 for Acute respiratory failure with hypoxia ([place]) [Z32.99] Acute diastolic CHF (congestive heart failure) ([place]) [I50.31] Acute on chronic congestive heart failure, unspecified heart  failure type ([place]) [I50.9]  1. Acute renal failure on chronic kidney disease stage IIIB with proteinuria: baseline creatinine of 1.79, GFR of 35 on 12/05/19. However suspect some level of progression of chronic kidney disease Chronic Kidney disease secondary to diabetes and hypertension Acute renal failure secondary to acute cardiorenal syndrome.  No acute indication for dialysis.   2. Hypertension with acute exacerbation of diastolic congestive heart failure.  - IV furosemide for at least one more day.  - Continue home regimen of amlodipine, isosorbide mononitrate, metoprolol and tamsulosin.   3. Anemia with chronic kidney disease: with iron deficiency: normocytic. Status post PRBC transfusion.  Hemoglobin 7.3 Not currently a candidate for ESA with malignancy.  With thrombocytopenia - IV iron for today.   4. Diabetes mellitus type II with chronic kidney disease: insulin dependent. Hemoglobin A1c of 5.2%.    LOS: 2 Brett [name] 8/8/202112:11 PM

## 2020-07-07 DIAGNOSIS — D500 Iron deficiency anemia secondary to blood loss (chronic): Secondary | ICD-10-CM

## 2020-07-07 LAB — BASIC METABOLIC PANEL
Anion gap: 13 (ref 5–15)
BUN: 67 mg/dL — ABNORMAL HIGH (ref 8–23)
CO2: 34 mmol/L — ABNORMAL HIGH (ref 22–32)
Calcium: 7.7 mg/dL — ABNORMAL LOW (ref 8.9–10.3)
Chloride: 94 mmol/L — ABNORMAL LOW (ref 98–111)
Creatinine, Ser: 2.96 mg/dL — ABNORMAL HIGH (ref 0.61–1.24)
GFR calc Af Amer: 22 mL/min — ABNORMAL LOW (ref >60)
GFR calc non Af Amer: 19 mL/min — ABNORMAL LOW (ref >60)
Glucose, Bld: 103 mg/dL — ABNORMAL HIGH (ref 70–99)
Potassium: 4 mmol/L (ref 3.5–5.1)
Sodium: 141 mmol/L (ref 135–145)

## 2020-07-07 LAB — GLUCOSE, CAPILLARY
Glucose-Capillary: 112 mg/dL — ABNORMAL HIGH (ref 70–99)
Glucose-Capillary: 155 mg/dL — ABNORMAL HIGH (ref 70–99)
Glucose-Capillary: 142 mg/dL — ABNORMAL HIGH (ref 70–99)
Glucose-Capillary: 196 mg/dL — ABNORMAL HIGH (ref 70–99)

## 2020-07-07 LAB — CBC
HCT: 26.4 % — ABNORMAL LOW (ref 39.0–52.0)
Hemoglobin: 8.3 g/dL — ABNORMAL LOW (ref 13.0–17.0)
MCH: 27.9 pg (ref 26.0–34.0)
MCHC: 31.4 g/dL (ref 30.0–36.0)
MCV: 88.6 fL (ref 80.0–100.0)
Platelets: 138 10*3/uL — ABNORMAL LOW (ref 150–400)
RBC: 2.98 MIL/uL — ABNORMAL LOW (ref 4.22–5.81)
RDW: 16.2 % — ABNORMAL HIGH (ref 11.5–15.5)
WBC: 7 10*3/uL (ref 4.0–10.5)
nRBC: 0 % (ref 0.0–0.2)

## 2020-07-07 MED ORDER — TORSEMIDE 20 MG PO TABS
20.0000 mg | ORAL_TABLET | Freq: Every day | ORAL | Status: DC
  Administered 2020-07-07 – 2020-07-10 (×4): 20 mg via ORAL
  Filled 2020-07-07 (×4): qty 1

## 2020-07-07 NOTE — Evaluation (Signed)
Physical Therapy Evaluation Patient Details Name: Brett [name] MRN: [id number] DOB: 1935/01/13 Today's Date: 07/07/2020   History of Present Illness  85 y.o. white male with B-cell lymphoma, aortic regurgitation, coronary artery disease, congestive heart failure, diabetes mellitus type II, COPD, hypertension, peptic ulcer disease, peripheral vascular disease and hearing loss who was admitted to [facility]  for Acute respiratory failure with hypoxia Iron deficiency anemia, acute diastolic CHF, requiring packed red blood cells and IV iron. Very hard of hearing   Clinical Impression  PT evaluation completed. Patient is very heard of hearing and needs gestures and tactile cues at times to follow single step commands. Patient is Min guard for bed mobility. Initially with sitting upright, Sp02 decreased to 88% and increased into the 90's after sitting for 1 minute. Patient attempted multiple times sit to stand transfers, however was unable to stand with maximal assistance likely due to generalized weakness. Max A for incremental scoot transfer along edge of bed x 4 bouts. Sp02 decreased to 86% after transfers and increased to 94% after returned to bed. Recommend PT to address functional limitations listed below to maximize independence and return to PLOF. SNF is recommended at this time.     Follow Up Recommendations SNF    Equipment Recommendations   (ongoing assessment )    Recommendations for Other Services       Precautions / Restrictions Precautions Precautions: Fall Restrictions Weight Bearing Restrictions: No      Mobility  Bed Mobility Overal bed mobility: Needs Assistance Bed Mobility: Supine to Sit;Sit to Supine     Supine to sit: Min guard Sit to supine: Min guard   General bed mobility comments: Min guard assistance provided.   Transfers Overall transfer level: Needs assistance   Transfers: Sit to/from Stand;Lateral/Scoot Transfers          Lateral/Scoot Transfers:  Max assist General transfer comment: patient attempted multiple sit to stand transfers with Max A and was unable to stand with LE weakness and fatigue. Max A for incrimental scooting along edge of bed x 4 bouts.   Ambulation/Gait                Stairs            Wheelchair Mobility    Modified Rankin (Stroke Patients Only)       Balance Overall balance assessment: Needs assistance Sitting-balance support: Feet supported;Single extremity supported Sitting balance-Leahy Scale: Fair                                       Pertinent Vitals/Pain Pain Assessment:  (no pain reported )    Home Living Family/patient expects to be discharged to:: Private residence Living Arrangements: Other relatives Available Help at Discharge: Friend(s);Neighbor Type of Home: House Home Access: Stairs to enter              Prior Function           Comments: patient states he ambulates at home, however is a poor historian and very hard of hearing. unable to provide details at this time      Hand Dominance        Extremity/Trunk Assessment   Upper Extremity Assessment Upper Extremity Assessment:  (difficult to assess given difficulty following commands/HOH )    Lower Extremity Assessment Lower Extremity Assessment: Generalized weakness;RLE deficits/detail;LLE deficits/detail RLE Deficits / Details: difficulty formally MMT due to difficulty  following commands/HOH. Patient is able to SLR in supine. unable to stand with assistance, generalized weakness throughout  LLE Deficits / Details: difficulty formally MMT due to difficulty following commands/HOH. Patient is able to SLR in supine. unable to stand with assistance, generalized weakness throughout        Communication   Communication: HOH  Cognition Arousal/Alertness: Awake/alert Behavior During Therapy: WFL for tasks assessed/performed Overall Cognitive Status: Difficult to assess                                  General Comments: difficult to assess given severity of hearing loss. Verbal cues and gestures used for instruction with mobility       General Comments      Exercises General Exercises - Lower Extremity Ankle Circles/Pumps: AROM;Strengthening;Both;10 reps;Supine Heel Slides: AAROM;Strengthening;Both;10 reps;Supine Hip ABduction/ADduction: AAROM;Strengthening;Both;10 reps;Supine Straight Leg Raises: AAROM;Strengthening;Both;10 reps;Supine Other Exercises Other Exercises: visual and tactile cues for technique. Sp02 remained in the 90's with bed level exercise    Assessment/Plan    PT Assessment Patient needs continued PT services  PT Problem List Decreased strength;Decreased activity tolerance;Decreased balance;Decreased mobility;Decreased safety awareness;Decreased knowledge of precautions;Cardiopulmonary status limiting activity       PT Treatment Interventions DME instruction;Gait training;Stair training;Functional mobility training;Therapeutic activities;Therapeutic exercise;Balance training;Wheelchair mobility training;Patient/family education    PT Goals (Current goals can be found in the Care Plan section)  Acute Rehab PT Goals Patient Stated Goal: patient unable to state at this time  PT Goal Formulation: Patient unable to participate in goal setting (due to hearing impairment ) Time For Goal Achievement: 07/21/20 Potential to Achieve Goals: Good    Frequency Min 2X/week   Barriers to discharge        Co-evaluation               AM-PAC PT "6 Clicks" Mobility  Outcome Measure Help needed turning from your back to your side while in a flat bed without using bedrails?: A Little Help needed moving from lying on your back to sitting on the side of a flat bed without using bedrails?: A Little Help needed moving to and from a bed to a chair (including a wheelchair)?: A Lot Help needed standing up from a chair using your arms (e.g.,  wheelchair or bedside chair)?: A Lot Help needed to walk in hospital room?: Total Help needed climbing 3-5 steps with a railing? : Total 6 Click Score: 12    End of Session Equipment Utilized During Treatment: Oxygen Activity Tolerance: Patient limited by fatigue Patient left: in bed;with call bell/phone within reach;with bed alarm set Nurse Communication: Mobility status (via white board updated ) PT Visit Diagnosis: Unsteadiness on feet (R26.81);Muscle weakness (generalized) (M62.81);Difficulty in walking, not elsewhere classified (R26.2)    Time: 9470-9628 PT Time Calculation (min) (ACUTE ONLY): 20 min   Charges:   PT Evaluation $PT Eval Moderate Complexity: 1 Mod          Brett [name], PT, MPT   Brett [name] 07/07/2020, 12:19 PM

## 2020-07-07 NOTE — Progress Notes (Signed)
Mobility Specialist - Progress Note   07/07/20 1416  Mobility  Activity Turned to back (supine) (Bed exercises)  Level of Assistance Standby assist, set-up cues, supervision of patient - no hands on  Assistive Device None  Mobility Response Tolerated well  Mobility performed by Mobility specialist  $Mobility charge 1 Mobility    Pre-mobility: 71 HR, 109/52 BP, 99% SpO2 Post-mobility: 74 HR, 111/49 BP, 93% SpO2   Pt was sleeping in bed on R side upon arrival. Pt agreed to session. Pt able to lay on his back independently. Pt performed bed exercises (slr, quad sets, heel slides, and hip add/ab) w/ SBA. Overall, pt tolerated session well. Pt left in bed with call bell and phone in reach. Nurse was notified.   Isair Inabinet Mobility Specialist  07/07/20, 2:20 PM

## 2020-07-07 NOTE — Progress Notes (Signed)
[facility]  ROUNDING NOTE   Subjective:   UOP 4775mL furosemide 40mg  IV bid.   Patient is hard of hearing and is unable to understand when spoken to. He states is "doing alright"  Objective:  Vital signs in last 24 hours:  Temp:  [97.3 F (36.3 C)-98.2 F (36.8 C)] 98.2 F (36.8 C) (08/09 1118) Pulse Rate:  [70-79] 79 (08/09 1118) Resp:  [16-24] 17 (08/09 1118) BP: (101-126)/(50-69) 126/56 (08/09 1118) SpO2:  [93 %-96 %] 96 % (08/09 1118) Weight:  [82.3 kg] 82.3 kg (08/09 0500)  Weight change: 0.199 kg Filed Weights   07/05/20 0343 07/06/20 0434 07/07/20 0500  Weight: 82.7 kg 82.1 kg 82.3 kg    Intake/Output: I/O last 3 completed shifts: In: 370 [P.O.:120; IV Piggyback:250] Out: 6300 [Urine:6300]   Intake/Output this shift:  Total I/O In: 120 [P.O.:120] Out: 625 [Urine:625]  Physical Exam: General: NAD, laying in bed  Head: Normocephalic, atraumatic. Moist oral mucosal membranes  Eyes: Anicteric, PERRL  Neck: Supple, trachea midline  Lungs:  Diminished bilateral bases  Heart: Regular rate and rhythm  Abdomen:  Soft, nontender,   Extremities:  no peripheral edema.  Neurologic: Moving all four extremities. +Hard of hearing  Skin: No lesions        Basic Metabolic Panel: Recent Labs  Lab 07/04/20 0912 07/04/20 0912 07/05/20 0438 07/06/20 0705 07/07/20 0611  NA 143  --  142 141 141  K 4.7  --  4.5 3.8 4.0  CL 105  --  102 97* 94*  CO2 28  --  30 32 34*  GLUCOSE 124*  --  229* 95 103*  BUN 34*  --  44* 61* 67*  CREATININE 2.54*  --  2.73* 2.88* 2.96*  CALCIUM 7.7*   < > 7.6* 7.5* 7.7*   < > = values in this interval not displayed.    Liver Function Tests: Recent Labs  Lab 07/04/20 0912  AST 17  ALT 13  ALKPHOS 76  BILITOT 0.8  PROT 7.6  ALBUMIN 3.4*   No results for input(s): LIPASE, AMYLASE in the last 168 hours. No results for input(s): AMMONIA in the last 168 hours.  CBC: Recent Labs  Lab 07/04/20 0912 07/05/20 0438  07/06/20 0705 07/07/20 0611  WBC 11.7* 8.5 8.3 7.0  NEUTROABS 9.7*  --   --   --   HGB 7.3* 6.4* 7.3* 8.3*  HCT 24.3* 20.8* 23.9* 26.4*  MCV 92.7 90.4 90.9 88.6  PLT 119* 100* 127* 138*    Cardiac Enzymes: No results for input(s): CKTOTAL, CKMB, CKMBINDEX, TROPONINI in the last 168 hours.  BNP: Invalid input(s): POCBNP  CBG: Recent Labs  Lab 07/06/20 1159 07/06/20 1640 07/06/20 2113 07/07/20 0725 07/07/20 1118  GLUCAP 132* 108* 157* 112* 155*    Microbiology: Results for orders placed or performed during the hospital encounter of 07/04/20  SARS Coronavirus 2 by RT PCR (hospital order, performed in [facility] hospital lab) Nasopharyngeal Nasopharyngeal Swab     Status: None   Collection Time: 07/04/20  9:13 AM   Specimen: Nasopharyngeal Swab  Result Value Ref Range Status   SARS Coronavirus 2 NEGATIVE NEGATIVE Final    Comment: (NOTE) SARS-CoV-2 target nucleic acids are NOT DETECTED.  The SARS-CoV-2 RNA is generally detectable in upper and lower respiratory specimens during the acute phase of infection. The lowest concentration of SARS-CoV-2 viral copies this assay can detect is 250 copies / mL. A negative result does not preclude SARS-CoV-2 infection and  should not be used as the sole basis for treatment or other patient management decisions.  A negative result may occur with improper specimen collection / handling, submission of specimen other than nasopharyngeal swab, presence of viral mutation(s) within the areas targeted by this assay, and inadequate number of viral copies (<250 copies / mL). A negative result must be combined with clinical observations, patient history, and epidemiological information.  Fact Sheet for Patients:   StrictlyIdeas.no  Fact Sheet for Healthcare Providers: BankingDealers.co.za  This test is not yet approved or  cleared by the [place] FDA and has been authorized for detection  and/or diagnosis of SARS-CoV-2 by FDA under an Emergency Use Authorization (EUA).  This EUA will remain in effect (meaning this test can be used) for the duration of the COVID-19 declaration under Section 564(b)(1) of the Act, 21 U.S.C. section 360bbb-3(b)(1), unless the authorization is terminated or revoked sooner.  Performed at [facility], [address] [place], [place] [address]     Coagulation Studies: No results for input(s): LABPROT, INR in the last 72 hours.  Urinalysis: No results for input(s): COLORURINE, LABSPEC, PHURINE, GLUCOSEU, HGBUR, BILIRUBINUR, KETONESUR, PROTEINUR, UROBILINOGEN, NITRITE, LEUKOCYTESUR in the last 72 hours.  Invalid input(s): APPERANCEUR    Imaging: No results found.   Medications:   . sodium chloride     . amLODipine  5 mg Oral Daily  . carbamide peroxide  5 drop Both EARS BID  . ferrous sulfate  325 mg Oral BID WC  . gabapentin  100 mg Oral BID  . insulin aspart  0-5 Units Subcutaneous QHS  . insulin aspart  0-9 Units Subcutaneous TID WC  . isosorbide mononitrate  30 mg Oral Daily  . metoprolol  200 mg Oral Daily  . mometasone-formoterol  2 puff Inhalation BID  . pantoprazole  40 mg Oral Daily  . polyethylene glycol  17 g Oral Daily  . rosuvastatin  40 mg Oral Daily  . sodium chloride flush  3 mL Intravenous Q12H  . tamsulosin  0.4 mg Oral QPC supper  . tiotropium  18 mcg Inhalation Daily  . torsemide  20 mg Oral Daily  . vitamin B-12  1,000 mcg Oral Daily  . Vitamin D (Ergocalciferol)  50,000 Units Oral Weekly   sodium chloride, acetaminophen, ipratropium-albuterol, ondansetron (ZOFRAN) IV, sodium chloride flush  Assessment/ Plan:  [name] is a 85 y.o. white male with B-cell lymphoma, aortic regurgitation, coronary artery disease, congestive heart failure, diabetes mellitus type II, COPD, hypertension, peptic ulcer disease, peripheral vascular disease and hearing loss who was admitted to [facility] on  07/04/2020 for Acute respiratory failure with hypoxia ([place]) [D40.81] Acute diastolic CHF (congestive heart failure) ([place]) [I50.31] Acute on chronic congestive heart failure, unspecified heart failure type ([place]) [I50.9]  1. Acute renal failure on chronic kidney disease stage IIIB with proteinuria: baseline creatinine of 1.79, GFR of 35 on 12/05/19. However suspect some level of progression of chronic kidney disease Chronic Kidney disease secondary to diabetes and hypertension Acute renal failure secondary to acute cardiorenal syndrome.  No acute indication for dialysis.   2. Hypertension with acute exacerbation of diastolic congestive heart failure. Now with metabolic alkalosis.  - Transition to PO diuretics: torsemide 20 PO - Continue home regimen of amlodipine, isosorbide mononitrate, metoprolol and tamsulosin.   3. Anemia with chronic kidney disease: with iron deficiency: normocytic. Status post PRBC transfusion.  Hemoglobin 8.3 Not currently a candidate for ESA with malignancy.  With thrombocytopenia IV iron 400mg   on 8/8.  - started on PO iron supplements.   4. Diabetes mellitus type II with chronic kidney disease: insulin dependent. Hemoglobin A1c of 5.2%.    LOS: 3 Jasmen Emrich 8/9/202112:58 PM

## 2020-07-07 NOTE — Plan of Care (Signed)
  Problem: Education: Goal: Knowledge of General Education information will improve Description: Including pain rating scale, medication(s)/side effects and non-pharmacologic comfort measures Outcome: Progressing   Problem: Activity: Goal: Risk for activity intolerance will decrease Outcome: Progressing   Problem: Coping: Goal: Level of anxiety will decrease Outcome: Progressing   

## 2020-07-07 NOTE — Plan of Care (Signed)
  Problem: Education: Goal: Knowledge of General Education information will improve Description: Including pain rating scale, medication(s)/side effects and non-pharmacologic comfort measures Outcome: Progressing   Problem: Clinical Measurements: Goal: Cardiovascular complication will be avoided Outcome: Progressing   

## 2020-07-07 NOTE — Progress Notes (Signed)
Patient ID: Brett [name], male   DOB: Aug 01, 1935, 85 y.o.   MRN: [id number] [facility] PROGRESS NOTE  Brett [name] PJA:[id number] DOB: 07-05-1935 DOA: 07/04/2020 PCP: Perrin Maltese, MD  HPI/Subjective: Patient feels okay.  No complaints of shortness of breath.  Patient states that he is weak and he has not been doing his exercises at home.  Patient also states that he does not want to come back to the hospital once he goes home.  Objective: Vitals:   07/07/20 0724 07/07/20 1118  BP: 126/60 (!) 126/56  Pulse: 77 79  Resp: 17 17  Temp: (!) 97.3 F (36.3 C) 98.2 F (36.8 C)  SpO2: 96% 96%    Intake/Output Summary (Last 24 hours) at 07/07/2020 1533 Last data filed at 07/07/2020 1330 Gross per 24 hour  Intake 240 ml  Output 3725 ml  Net -3485 ml   Filed Weights   07/05/20 0343 07/06/20 0434 07/07/20 0500  Weight: 82.7 kg 82.1 kg 82.3 kg    ROS: Review of Systems  Respiratory: Negative for shortness of breath.   Cardiovascular: Negative for chest pain.  Gastrointestinal: Negative for abdominal pain.   Exam: Physical Exam HENT:     Nose: No mucosal edema.     Mouth/Throat:     Pharynx: No oropharyngeal exudate.  Eyes:     General: Lids are normal.     Conjunctiva/sclera: Conjunctivae normal.     Pupils: Pupils are equal, round, and reactive to light.  Cardiovascular:     Rate and Rhythm: Normal rate and regular rhythm.     Heart sounds: Normal heart sounds, S1 normal and S2 normal.  Pulmonary:     Breath sounds: Examination of the right-lower field reveals decreased breath sounds. Examination of the left-lower field reveals decreased breath sounds. Decreased breath sounds present. No wheezing, rhonchi or rales.  Abdominal:     Palpations: Abdomen is soft.     Tenderness: There is no abdominal tenderness.  Musculoskeletal:     Right lower leg: No swelling.     Left lower leg: No swelling.  Skin:    General: Skin is warm.     Findings: No rash.   Neurological:     Mental Status: He is alert and oriented to person, place, and time.       Data Reviewed: Basic Metabolic Panel: Recent Labs  Lab 07/04/20 0912 07/05/20 0438 07/06/20 0705 07/07/20 0611  NA 143 142 141 141  K 4.7 4.5 3.8 4.0  CL 105 102 97* 94*  CO2 28 30 32 34*  GLUCOSE 124* 229* 95 103*  BUN 34* 44* 61* 67*  CREATININE 2.54* 2.73* 2.88* 2.96*  CALCIUM 7.7* 7.6* 7.5* 7.7*   Liver Function Tests: Recent Labs  Lab 07/04/20 0912  AST 17  ALT 13  ALKPHOS 76  BILITOT 0.8  PROT 7.6  ALBUMIN 3.4*   CBC: Recent Labs  Lab 07/04/20 0912 07/05/20 0438 07/06/20 0705 07/07/20 0611  WBC 11.7* 8.5 8.3 7.0  NEUTROABS 9.7*  --   --   --   HGB 7.3* 6.4* 7.3* 8.3*  HCT 24.3* 20.8* 23.9* 26.4*  MCV 92.7 90.4 90.9 88.6  PLT 119* 100* 127* 138*   BNP (last 3 results) Recent Labs    04/04/20 0446 04/10/20 0418 07/04/20 0912  BNP 591.0* 301.0* 816.6*     CBG: Recent Labs  Lab 07/06/20 1159 07/06/20 1640 07/06/20 2113 07/07/20 0725 07/07/20 1118  GLUCAP 132* 108* 157* 112* 155*  Recent Results (from the past 240 hour(s))  SARS Coronavirus 2 by RT PCR (hospital order, performed in [facility] hospital lab) Nasopharyngeal Nasopharyngeal Swab     Status: None   Collection Time: 07/04/20  9:13 AM   Specimen: Nasopharyngeal Swab  Result Value Ref Range Status   SARS Coronavirus 2 NEGATIVE NEGATIVE Final    Comment: (NOTE) SARS-CoV-2 target nucleic acids are NOT DETECTED.  The SARS-CoV-2 RNA is generally detectable in upper and lower respiratory specimens during the acute phase of infection. The lowest concentration of SARS-CoV-2 viral copies this assay can detect is 250 copies / mL. A negative result does not preclude SARS-CoV-2 infection and should not be used as the sole basis for treatment or other patient management decisions.  A negative result may occur with improper specimen collection / handling, submission of specimen other than  nasopharyngeal swab, presence of viral mutation(s) within the areas targeted by this assay, and inadequate number of viral copies (<250 copies / mL). A negative result must be combined with clinical observations, patient history, and epidemiological information.  Fact Sheet for Patients:   StrictlyIdeas.no  Fact Sheet for Healthcare Providers: BankingDealers.co.za  This test is not yet approved or  cleared by the [place] FDA and has been authorized for detection and/or diagnosis of SARS-CoV-2 by FDA under an Emergency Use Authorization (EUA).  This EUA will remain in effect (meaning this test can be used) for the duration of the COVID-19 declaration under Section 564(b)(1) of the Act, 21 U.S.C. section 360bbb-3(b)(1), unless the authorization is terminated or revoked sooner.  Performed at [facility], [address] [place], [place] [address]      Scheduled Meds: . amLODipine  5 mg Oral Daily  . carbamide peroxide  5 drop Both EARS BID  . ferrous sulfate  325 mg Oral BID WC  . gabapentin  100 mg Oral BID  . insulin aspart  0-5 Units Subcutaneous QHS  . insulin aspart  0-9 Units Subcutaneous TID WC  . isosorbide mononitrate  30 mg Oral Daily  . metoprolol  200 mg Oral Daily  . mometasone-formoterol  2 puff Inhalation BID  . pantoprazole  40 mg Oral Daily  . polyethylene glycol  17 g Oral Daily  . rosuvastatin  40 mg Oral Daily  . sodium chloride flush  3 mL Intravenous Q12H  . tamsulosin  0.4 mg Oral QPC supper  . tiotropium  18 mcg Inhalation Daily  . torsemide  20 mg Oral Daily  . vitamin B-12  1,000 mcg Oral Daily  . Vitamin D (Ergocalciferol)  50,000 Units Oral Weekly   Continuous Infusions: . sodium chloride      Assessment/Plan:  1. Acute on chronic hypoxic respiratory failure.  Initially required BiPAP on presentation now down to 2 L nasal cannula. 2. Acute diastolic congestive heart failure.   With creatinine increasing patient's Lasix IV was switched over to torsemide. 3. Iron deficiency anemia.  Responded to 1 unit of packed red blood cells.  Also received IV iron.  Since the patient is guaiac positive I will hold Plavix.  B12 is slightly low will give B12 supplementation 4. Paroxysmal atrial fibrillation.  Metoprolol for rate control. 5. Type 2 diabetes mellitus with chronic kidney disease stage IV.  Since creatinine increased with IV diuresis patient switched over to torsemide.  Continue to monitor closely. 6. Essential hypertension on amlodipine and metoprolol. 7. History of CAD and pulmonary hypertension.  Hold Plavix with patient being guaiac positive and  requiring transfusion.  Continue metoprolol. 8. Cerumen impaction.  Continue Debrox otic solution.  Will need ENT as outpatient. 9. Weakness.  Physical therapy recommended rehab.  Will consult transitional care team to look into rehab bed availability     Code Status:     Code Status Orders  (From admission, onward)         Start     Ordered   07/04/20 1158  Do not attempt resuscitation (DNR)  Continuous       Question Answer Comment  In the event of cardiac or respiratory ARREST Do not call a "code blue"   In the event of cardiac or respiratory ARREST Do not perform Intubation, CPR, defibrillation or ACLS   In the event of cardiac or respiratory ARREST Use medication by any route, position, wound care, and other measures to relive pain and suffering. May use oxygen, suction and manual treatment of airway obstruction as needed for comfort.      07/04/20 1159        Code Status History    Date Active Date Inactive Code Status Order ID Comments User Context   04/04/2020 1514 04/16/2020 0357 DNR [id number]  Ivor Costa, MD Inpatient   04/04/2020 0845 04/04/2020 1514 DNR [id number]  Ivor Costa, MD ED   11/26/2019 1352 12/06/2019 0007 DNR [id number]  Toy Baker, MD ED   11/25/2019 2254 11/26/2019 1352 DNR [id number]   Toy Baker, MD ED   07/14/2019 1007 07/16/2019 1444 Full Code [id number]  Loletha Grayer, MD ED   05/23/2019 1908 05/25/2019 1443 DNR [id number]  Myra Rude, RN Inpatient   05/22/2019 1357 05/23/2019 1908 Full Code [id number]  Loletha Grayer, MD ED   02/09/2019 0410 02/12/2019 1734 Full Code [id number]  Arta Silence, MD Inpatient   01/21/2019 1852 01/23/2019 1954 DNR [id number]  Henreitta Leber, MD Inpatient   08/24/2018 0149 08/25/2018 1536 DNR [id number]  Amelia Jo, MD Inpatient   05/29/2018 1513 05/31/2018 1617 DNR [id number]  Gladstone Lighter, MD Inpatient   12/30/2017 1815 01/01/2018 1453 Full Code [id number]  Vaughan Basta, MD Inpatient   12/05/2017 2146 12/08/2017 1428 Full Code [id number]  Fritzi Mandes, MD Inpatient   11/13/2017 2123 11/16/2017 1704 Full Code [id number]  Idelle Crouch, MD Inpatient   09/22/2017 0036 09/24/2017 1837 Full Code [id number]  Salary, Avel Peace, MD Inpatient   09/22/2017 0033 09/22/2017 0036 DNR [id number]  Gorden Harms, MD ED   12/02/2016 0812 12/03/2016 2009 Full Code [id number]  Vaughan Basta, MD Inpatient   07/11/2016 0018 07/11/2016 1707 Full Code [id number]  Lance Coon, MD Inpatient   06/18/2016 0340 06/21/2016 1844 Full Code [id number]  Saundra Shelling, MD ED   Advance Care Planning Activity    Advance Directive Documentation     Most Recent Value  Type of Advance Directive Out of facility DNR (pink MOST or yellow form)  Pre-existing out of facility DNR order (yellow form or pink MOST form) Physician notified to receive inpatient order  "MOST" Form in Place? --     Family Communication: Spoke with caregiver Disposition Plan: Status is: Inpatient  Dispo: The patient is from: Home              Anticipated d/c is to: Rehab              Anticipated d/c date is: Depending on how quickly we can get a rehab bed potentially 07/08/2020 versus 07/09/2020              Patient currently  being treated for acute diastolic congestive heart  failure and acute on chronic hypoxic respiratory failure.  Also followed closely for anemia and acute kidney injury on chronic kidney disease.  Again difficult balance monitoring multiple organ systems.  Consultants:  Nephrology  Time spent: 27 minutes  [place]

## 2020-07-08 ENCOUNTER — Inpatient Hospital Stay: Payer: Medicare PPO

## 2020-07-08 DIAGNOSIS — R1013 Epigastric pain: Secondary | ICD-10-CM

## 2020-07-08 LAB — BASIC METABOLIC PANEL
Anion gap: 13 (ref 5–15)
BUN: 71 mg/dL — ABNORMAL HIGH (ref 8–23)
CO2: 33 mmol/L — ABNORMAL HIGH (ref 22–32)
Calcium: 8.1 mg/dL — ABNORMAL LOW (ref 8.9–10.3)
Chloride: 94 mmol/L — ABNORMAL LOW (ref 98–111)
Creatinine, Ser: 2.66 mg/dL — ABNORMAL HIGH (ref 0.61–1.24)
GFR calc Af Amer: 25 mL/min — ABNORMAL LOW (ref >60)
GFR calc non Af Amer: 21 mL/min — ABNORMAL LOW (ref >60)
Glucose, Bld: 123 mg/dL — ABNORMAL HIGH (ref 70–99)
Potassium: 4 mmol/L (ref 3.5–5.1)
Sodium: 140 mmol/L (ref 135–145)

## 2020-07-08 LAB — GLUCOSE, CAPILLARY
Glucose-Capillary: 113 mg/dL — ABNORMAL HIGH (ref 70–99)
Glucose-Capillary: 174 mg/dL — ABNORMAL HIGH (ref 70–99)
Glucose-Capillary: 146 mg/dL — ABNORMAL HIGH (ref 70–99)
Glucose-Capillary: 185 mg/dL — ABNORMAL HIGH (ref 70–99)

## 2020-07-08 LAB — HEMOGLOBIN: Hemoglobin: 8.8 g/dL — ABNORMAL LOW (ref 13.0–17.0)

## 2020-07-08 MED ORDER — LACTULOSE 10 GM/15ML PO SOLN
30.0000 g | Freq: Two times a day (BID) | ORAL | Status: DC
  Administered 2020-07-08 – 2020-07-10 (×5): 30 g via ORAL
  Filled 2020-07-08 (×5): qty 60

## 2020-07-08 MED ORDER — ALUM & MAG HYDROXIDE-SIMETH 200-200-20 MG/5ML PO SUSP
30.0000 mL | Freq: Once | ORAL | Status: AC
  Administered 2020-07-08: 30 mL via ORAL
  Filled 2020-07-08: qty 30

## 2020-07-08 MED ORDER — PANTOPRAZOLE SODIUM 40 MG PO TBEC
40.0000 mg | DELAYED_RELEASE_TABLET | Freq: Two times a day (BID) | ORAL | Status: DC
  Administered 2020-07-08 – 2020-07-11 (×6): 40 mg via ORAL
  Filled 2020-07-08 (×6): qty 1

## 2020-07-08 MED ORDER — OXYCODONE HCL 5 MG PO TABS
5.0000 mg | ORAL_TABLET | ORAL | Status: DC | PRN
  Administered 2020-07-10: 5 mg via ORAL
  Filled 2020-07-08: qty 1

## 2020-07-08 NOTE — TOC Initial Note (Signed)
Transition of Care [facility]) - Initial/Assessment Note    Patient Details  Name: Brett [name] MRN: [id number] Date of Birth: 1935/10/03  [facility]) CM/SW Contact:    Eileen Stanford, LCSW Phone Number: 07/08/2020, 3:22 PM  Clinical Narrative:    Pt is very hard of hearing. CSW spoke with pt's roommate/caretaker Tommy and he states pt has been to SNF before. Konrad Dolores is agreeable for pt to d/c to SNF again. Pt was at [facility] and it was "ok." Konrad Dolores would prefer pt go to [facility] or [facility] but if neither he would be ok with [facility]. CSW has sent referrals.               Expected Discharge Plan: Skilled Nursing Facility Barriers to Discharge: Continued Medical Work up, Ship broker   Patient Goals and CMS Choice Patient states their goals for this hospitalization and ongoing recovery are:: for pt to get better   Choice offered to / list presented to :  (Caretaker/roomate)  Expected Discharge Plan and Services Expected Discharge Plan: [place] In-house Referral: Clinical Social Work   Post Acute Care Choice: [place] Living arrangements for the past 2 months: [place]                                      Prior Living Arrangements/Services Living arrangements for the past 2 months: Single Family Home Lives with:: Roommate Patient language and need for interpreter reviewed:: Yes Do you feel safe going back to the place where you live?: Yes      Need for Family Participation in Patient Care: Yes (Comment) Care giver support system in place?: Yes (comment)   Criminal Activity/Legal Involvement Pertinent to Current Situation/Hospitalization: No - Comment as needed  Activities of Daily Living Home Assistive Devices/Equipment: Scales, Eyeglasses, CBG Meter ADL Screening (condition at time of admission) Patient's cognitive ability adequate to safely complete daily activities?:  Yes Is the patient deaf or have difficulty hearing?: Yes Does the patient have difficulty seeing, even when wearing glasses/contacts?: No Does the patient have difficulty concentrating, remembering, or making decisions?: Yes Patient able to express need for assistance with ADLs?: Yes Does the patient have difficulty dressing or bathing?: No Independently performs ADLs?: Yes (appropriate for developmental age) Does the patient have difficulty walking or climbing stairs?: Yes Weakness of Legs: Both Weakness of Arms/Hands: Both  Permission Sought/Granted Permission sought to share information with : Family Supports    Share Information with NAME: Konrad Dolores  Permission granted to share info w AGENCY: Radiation protection practitioner, [facility], [place] granted to share info w Relationship: roomate/caretaker     Emotional Assessment Appearance:: Appears stated age Attitude/Demeanor/Rapport: Unable to Assess Affect (typically observed): Unable to Assess Orientation: : Oriented to Situation, Oriented to  Time, Oriented to Place, Oriented to Self (very hard of hearing) Alcohol / Substance Use: Not Applicable Psych Involvement: No (comment)  Admission diagnosis:  Acute respiratory failure with hypoxia (HCC) [D22.02] Acute diastolic CHF (congestive heart failure) (HCC) [I50.31] Acute on chronic congestive heart failure, unspecified heart failure type [facility]) [I50.9] Patient Active Problem List   Diagnosis Date Noted  . Epigastric abdominal pain   . Bilateral impacted cerumen   . Acute diastolic CHF (congestive heart failure) ([place]) 07/04/2020  . History of anemia due to CKD   . AF (paroxysmal atrial fibrillation) ([place])   .  Hypotension 04/14/2020  . AKI (acute kidney injury) ([place])   . Goals of care, counseling/discussion   . Palliative care by specialist   . CHF, acute on chronic ([place]) 04/04/2020  . CKD (chronic kidney disease), stage IIIb 04/04/2020  . Type 2 diabetes mellitus with stage  4 chronic kidney disease ([place]) 04/04/2020  . HLD (hyperlipidemia) 04/04/2020  . Acute on chronic diastolic CHF (congestive heart failure) ([place]) 04/04/2020  . Iron deficiency anemia 04/04/2020  . Hypernatremia   . Dysphagia following unspecified cerebrovascular disease   . High serum osmolar gap 11/26/2019  . Atrial fibrillation, chronic ([place]) 11/25/2019  . Acute encephalopathy 11/25/2019  . Atrial fibrillation with RVR ([place]) 05/22/2019  . Acute on chronic respiratory failure with hypoxemia ([place]) 02/09/2019  . Pneumonia 01/21/2019  . Type 2 diabetes mellitus with hyperosmolar nonketotic hyperglycemia ([place]) 08/24/2018  . CHF exacerbation ([place]) 05/29/2018  . COPD exacerbation ([place]) 12/05/2017  . CAP (community acquired pneumonia) 11/13/2017  . Acute respiratory failure with hypoxia ([place]) 11/13/2017  . SIRS (systemic inflammatory response syndrome) ([place]) 11/13/2017  . Stroke ([place]) 12/02/2016  . Stroke (cerebrum) ([place]) 12/02/2016  . Leg weakness, bilateral 09/03/2016  . Chronic diastolic heart failure ([place]) 08/06/2016  . Pleural effusion 08/05/2016  . Sepsis ([place]) 07/10/2016  . HCAP (healthcare-associated pneumonia) 07/10/2016  . Essential hypertension 07/10/2016  . Diabetes ([place]) 07/10/2016  . CAD (coronary artery disease) 07/10/2016  . COPD (chronic obstructive pulmonary disease) ([place]) 06/20/2016  . Chest pain 06/18/2016  . B-cell lymphoma ([place]) 10/14/2015   PCP:  Perrin Maltese, MD Pharmacy:   [id number] [address], [place] [address] Phone: [phone] Fax: [phone]     Social Determinants of Health (SDOH) Interventions    Readmission Risk Interventions Readmission Risk Prevention Plan 07/05/2020 04/09/2020 12/05/2019  Transportation Screening - Complete -  Medication Review (RN Care Manager) - Referral to Pharmacy -  PCP or Specialist appointment within 3-5 days of discharge - Complete Complete  HRI or Home Care  Consult - Complete (No Data)  SW Recovery Care/Counseling Consult - - Complete  Palliative Care Screening Not Applicable Complete Not Applicable  Skilled Nursing Facility Not Applicable Complete Complete  Some recent data might be hidden

## 2020-07-08 NOTE — Progress Notes (Addendum)
Mobility Specialist - Progress Note   07/08/20 1315  Mobility  Activity Dangled on edge of bed  Level of Assistance Moderate assist, patient does 50-74%  Mobility Response Tolerated fair  Mobility performed by Mobility specialist  $Mobility charge 1 Mobility    Pre-mobility: 79 HR, 116/56 BP, 96% SpO2 During mobility: 93 HR, 105/49 BP, 94% SpO2 Post-mobility: 86 HR, 105/58 BP, 95% SpO2   Pt sleeping in bed upon arrival. Easily awakens and agreed to mobility session. Pt able to get to EOB w/ mod. Assist. Further mobility limited d/t abdominal pain after sitting EOB for a couple of minutes. Pt rated pain 8/10. Pt laid back in bed, relieving pain. Nurse was notified. In conclusion, pt tolerated session fair. Pt left in bed with alarm set, and call bell and phone in reach.     Natoshia Souter Mobility Specialist  07/08/20, 1:23 PM

## 2020-07-08 NOTE — Progress Notes (Signed)
[facility]  ROUNDING NOTE   Subjective:   UOP 2375mL Torsemide 20mg  PO bid.   Patient is hard of hearing and is unable to understand when spoken to. He states is "doing alright"  Objective:  Vital signs in last 24 hours:  Temp:  [97.6 F (36.4 C)-98.8 F (37.1 C)] 97.6 F (36.4 C) (08/10 1119) Pulse Rate:  [69-79] 75 (08/10 1119) Resp:  [16-20] 20 (08/10 1119) BP: (111-123)/(55-65) 111/57 (08/10 1119) SpO2:  [94 %-99 %] 94 % (08/10 1119) Weight:  [78.8 kg] 78.8 kg (08/10 0616)  Weight change: -3.465 kg Filed Weights   07/06/20 0434 07/07/20 0500 07/08/20 0616  Weight: 82.1 kg 82.3 kg 78.8 kg    Intake/Output: I/O last 3 completed shifts: In: 26 [P.O.:480] Out: 5075 [Urine:5075]   Intake/Output this shift:  Total I/O In: 240 [P.O.:240] Out: -   Physical Exam: General: NAD, laying in bed  Head: Normocephalic, atraumatic. Moist oral mucosal membranes  Eyes: Anicteric, PERRL  Neck: Supple, trachea midline  Lungs:  Diminished bilateral bases  Heart: Regular rate and rhythm  Abdomen:  Soft, nontender,   Extremities:  no peripheral edema.  Neurologic: Moving all four extremities. +Hard of hearing  Skin: No lesions        Basic Metabolic Panel: Recent Labs  Lab 07/04/20 0912 07/04/20 0912 07/05/20 0438 07/05/20 0438 07/06/20 0705 07/07/20 0611 07/08/20 0459  NA 143  --  142  --  141 141 140  K 4.7  --  4.5  --  3.8 4.0 4.0  CL 105  --  102  --  97* 94* 94*  CO2 28  --  30  --  32 34* 33*  GLUCOSE 124*  --  229*  --  95 103* 123*  BUN 34*  --  44*  --  61* 67* 71*  CREATININE 2.54*  --  2.73*  --  2.88* 2.96* 2.66*  CALCIUM 7.7*   < > 7.6*   < > 7.5* 7.7* 8.1*   < > = values in this interval not displayed.    Liver Function Tests: Recent Labs  Lab 07/04/20 0912  AST 17  ALT 13  ALKPHOS 76  BILITOT 0.8  PROT 7.6  ALBUMIN 3.4*   No results for input(s): LIPASE, AMYLASE in the last 168 hours. No results for input(s): AMMONIA in  the last 168 hours.  CBC: Recent Labs  Lab 07/04/20 0912 07/05/20 0438 07/06/20 0705 07/07/20 0611 07/08/20 0459  WBC 11.7* 8.5 8.3 7.0  --   NEUTROABS 9.7*  --   --   --   --   HGB 7.3* 6.4* 7.3* 8.3* 8.8*  HCT 24.3* 20.8* 23.9* 26.4*  --   MCV 92.7 90.4 90.9 88.6  --   PLT 119* 100* 127* 138*  --     Cardiac Enzymes: No results for input(s): CKTOTAL, CKMB, CKMBINDEX, TROPONINI in the last 168 hours.  BNP: Invalid input(s): POCBNP  CBG: Recent Labs  Lab 07/07/20 1118 07/07/20 1618 07/07/20 2103 07/08/20 0813 07/08/20 1121  GLUCAP 155* 142* 196* 113* 174*    Microbiology: Results for orders placed or performed during the hospital encounter of 07/04/20  SARS Coronavirus 2 by RT PCR (hospital order, performed in [facility] hospital lab) Nasopharyngeal Nasopharyngeal Swab     Status: None   Collection Time: 07/04/20  9:13 AM   Specimen: Nasopharyngeal Swab  Result Value Ref Range Status   SARS Coronavirus 2 NEGATIVE NEGATIVE Final  Comment: (NOTE) SARS-CoV-2 target nucleic acids are NOT DETECTED.  The SARS-CoV-2 RNA is generally detectable in upper and lower respiratory specimens during the acute phase of infection. The lowest concentration of SARS-CoV-2 viral copies this assay can detect is 250 copies / mL. A negative result does not preclude SARS-CoV-2 infection and should not be used as the sole basis for treatment or other patient management decisions.  A negative result may occur with improper specimen collection / handling, submission of specimen other than nasopharyngeal swab, presence of viral mutation(s) within the areas targeted by this assay, and inadequate number of viral copies (<250 copies / mL). A negative result must be combined with clinical observations, patient history, and epidemiological information.  Fact Sheet for Patients:   StrictlyIdeas.no  Fact Sheet for Healthcare  Providers: BankingDealers.co.za  This test is not yet approved or  cleared by the [place] FDA and has been authorized for detection and/or diagnosis of SARS-CoV-2 by FDA under an Emergency Use Authorization (EUA).  This EUA will remain in effect (meaning this test can be used) for the duration of the COVID-19 declaration under Section 564(b)(1) of the Act, 21 U.S.C. section 360bbb-3(b)(1), unless the authorization is terminated or revoked sooner.  Performed at [facility], [address] [place], [place] [address]     Coagulation Studies: No results for input(s): LABPROT, INR in the last 72 hours.  Urinalysis: No results for input(s): COLORURINE, LABSPEC, PHURINE, GLUCOSEU, HGBUR, BILIRUBINUR, KETONESUR, PROTEINUR, UROBILINOGEN, NITRITE, LEUKOCYTESUR in the last 72 hours.  Invalid input(s): APPERANCEUR    Imaging: DG Abd Portable 2V  Result Date: 07/08/2020 CLINICAL DATA:  Epigastric pain.  Hypertension EXAM: PORTABLE ABDOMEN - 2 VIEW COMPARISON:  November 30, 2019. FINDINGS: Supine and upright images obtained. There is moderate stool throughout colon. There is no bowel dilatation or air-fluid to suggest bowel obstruction. No free air. There are occasional vascular calcifications in the pelvis. Lung bases are clear. IMPRESSION: Moderate stool in colon.  No bowel obstruction or free air. Electronically Signed   By: Lowella Grip III M.D.   On: 07/08/2020 11:25     Medications:   . sodium chloride     . alum & mag hydroxide-simeth  30 mL Oral Once  . amLODipine  5 mg Oral Daily  . carbamide peroxide  5 drop Both EARS BID  . ferrous sulfate  325 mg Oral BID WC  . gabapentin  100 mg Oral BID  . insulin aspart  0-5 Units Subcutaneous QHS  . insulin aspart  0-9 Units Subcutaneous TID WC  . isosorbide mononitrate  30 mg Oral Daily  . metoprolol  200 mg Oral Daily  . mometasone-formoterol  2 puff Inhalation BID  . pantoprazole  40 mg Oral  BID  . polyethylene glycol  17 g Oral Daily  . rosuvastatin  40 mg Oral Daily  . sodium chloride flush  3 mL Intravenous Q12H  . tamsulosin  0.4 mg Oral QPC supper  . tiotropium  18 mcg Inhalation Daily  . torsemide  20 mg Oral Daily  . vitamin B-12  1,000 mcg Oral Daily  . Vitamin D (Ergocalciferol)  50,000 Units Oral Weekly   sodium chloride, acetaminophen, ipratropium-albuterol, ondansetron (ZOFRAN) IV, oxyCODONE, sodium chloride flush  Assessment/ Plan:  [name] is a 85 y.o. white male with B-cell lymphoma, aortic regurgitation, coronary artery disease, congestive heart failure, diabetes mellitus type II, COPD, hypertension, peptic ulcer disease, peripheral vascular disease and hearing loss who was admitted  to [facility] on 07/04/2020 for Acute respiratory failure with hypoxia (HCC) [X50.56] Acute diastolic CHF (congestive heart failure) (HCC) [I50.31] Acute on chronic congestive heart failure, unspecified heart failure type ([place]) [I50.9]  1. Acute renal failure on chronic kidney disease stage IIIB with proteinuria: baseline creatinine of 1.79, GFR of 35 on 12/05/19. However suspect some level of progression of chronic kidney disease Chronic Kidney disease secondary to diabetes and hypertension Acute renal failure secondary to acute cardiorenal syndrome.  No acute indication for dialysis.  Not on an ACE-I/ARB at home.   2. Hypertension with acute exacerbation of diastolic congestive heart failure. Now with metabolic alkalosis.  - Continue: torsemide 20 PO - Continue home regimen of amlodipine, isosorbide mononitrate, metoprolol and tamsulosin.   3. Anemia with chronic kidney disease: with iron deficiency: normocytic. Status post PRBC transfusion.  Hemoglobin 8.3 Not currently a candidate for ESA with malignancy.  With thrombocytopenia IV iron 400mg  on 8/8.  - continue PO iron supplements.   4. Diabetes mellitus type II with chronic kidney disease: insulin dependent.  Hemoglobin A1c of 5.2%.    LOS: [place] 8/10/20211:00 PM

## 2020-07-08 NOTE — Progress Notes (Signed)
Patient ID: Brett [name], male   DOB: 23-Jul-1935, 85 y.o.   MRN: [id number] [facility] PROGRESS NOTE  Brett [name] JME:[id number] DOB: 1935/01/17 DOA: 07/04/2020 PCP: Perrin Maltese, MD  HPI/Subjective: Patient's hard of hearing.  Today complains of some pain in his epigastric area.  No nausea or vomiting just some pain.  States his breathing is okay.  Objective: Vitals:   07/08/20 0812 07/08/20 1119  BP: 123/65 (!) 111/57  Pulse: 79 75  Resp: 16 20  Temp: 98.1 F (36.7 C) 97.6 F (36.4 C)  SpO2: 99% 94%    Intake/Output Summary (Last 24 hours) at 07/08/2020 1459 Last data filed at 07/08/2020 0950 Gross per 24 hour  Intake 480 ml  Output 1750 ml  Net -1270 ml   Filed Weights   07/06/20 0434 07/07/20 0500 07/08/20 0616  Weight: 82.1 kg 82.3 kg 78.8 kg    ROS: Review of Systems  Respiratory: Negative for shortness of breath.   Cardiovascular: Negative for chest pain.  Gastrointestinal: Positive for abdominal pain. Negative for nausea and vomiting.   Exam: Physical Exam HENT:     Nose: No mucosal edema.     Mouth/Throat:     Pharynx: No oropharyngeal exudate.  Eyes:     General: Lids are normal.     Conjunctiva/sclera: Conjunctivae normal.     Pupils: Pupils are equal, round, and reactive to light.  Cardiovascular:     Rate and Rhythm: Normal rate and regular rhythm.     Heart sounds: Normal heart sounds, S1 normal and S2 normal.  Pulmonary:     Breath sounds: Examination of the right-lower field reveals decreased breath sounds. Examination of the left-lower field reveals decreased breath sounds. Decreased breath sounds present. No wheezing, rhonchi or rales.  Abdominal:     Palpations: Abdomen is soft.     Tenderness: There is abdominal tenderness in the epigastric area.  Musculoskeletal:     Right ankle: Swelling present.     Left ankle: Swelling present.  Skin:    General: Skin is warm.     Findings: No rash.  Neurological:     Mental Status:  He is alert and oriented to person, place, and time.       Data Reviewed: Basic Metabolic Panel: Recent Labs  Lab 07/04/20 0912 07/05/20 0438 07/06/20 0705 07/07/20 0611 07/08/20 0459  NA 143 142 141 141 140  K 4.7 4.5 3.8 4.0 4.0  CL 105 102 97* 94* 94*  CO2 28 30 32 34* 33*  GLUCOSE 124* 229* 95 103* 123*  BUN 34* 44* 61* 67* 71*  CREATININE 2.54* 2.73* 2.88* 2.96* 2.66*  CALCIUM 7.7* 7.6* 7.5* 7.7* 8.1*   Liver Function Tests: Recent Labs  Lab 07/04/20 0912  AST 17  ALT 13  ALKPHOS 76  BILITOT 0.8  PROT 7.6  ALBUMIN 3.4*   CBC: Recent Labs  Lab 07/04/20 0912 07/05/20 0438 07/06/20 0705 07/07/20 0611 07/08/20 0459  WBC 11.7* 8.5 8.3 7.0  --   NEUTROABS 9.7*  --   --   --   --   HGB 7.3* 6.4* 7.3* 8.3* 8.8*  HCT 24.3* 20.8* 23.9* 26.4*  --   MCV 92.7 90.4 90.9 88.6  --   PLT 119* 100* 127* 138*  --    BNP (last 3 results) Recent Labs    04/04/20 0446 04/10/20 0418 07/04/20 0912  BNP 591.0* 301.0* 816.6*    CBG: Recent Labs  Lab 07/07/20 1118 07/07/20 1618  07/07/20 2103 07/08/20 0813 07/08/20 1121  GLUCAP 155* 142* 196* 113* 174*    Recent Results (from the past 240 hour(s))  SARS Coronavirus 2 by RT PCR (hospital order, performed in [facility] hospital lab) Nasopharyngeal Nasopharyngeal Swab     Status: None   Collection Time: 07/04/20  9:13 AM   Specimen: Nasopharyngeal Swab  Result Value Ref Range Status   SARS Coronavirus 2 NEGATIVE NEGATIVE Final    Comment: (NOTE) SARS-CoV-2 target nucleic acids are NOT DETECTED.  The SARS-CoV-2 RNA is generally detectable in upper and lower respiratory specimens during the acute phase of infection. The lowest concentration of SARS-CoV-2 viral copies this assay can detect is 250 copies / mL. A negative result does not preclude SARS-CoV-2 infection and should not be used as the sole basis for treatment or other patient management decisions.  A negative result may occur with improper specimen  collection / handling, submission of specimen other than nasopharyngeal swab, presence of viral mutation(s) within the areas targeted by this assay, and inadequate number of viral copies (<250 copies / mL). A negative result must be combined with clinical observations, patient history, and epidemiological information.  Fact Sheet for Patients:   StrictlyIdeas.no  Fact Sheet for Healthcare Providers: BankingDealers.co.za  This test is not yet approved or  cleared by the [place] FDA and has been authorized for detection and/or diagnosis of SARS-CoV-2 by FDA under an Emergency Use Authorization (EUA).  This EUA will remain in effect (meaning this test can be used) for the duration of the COVID-19 declaration under Section 564(b)(1) of the Act, 21 U.S.C. section 360bbb-3(b)(1), unless the authorization is terminated or revoked sooner.  Performed at [facility], [address] [place], [place] [address]      Studies: DG Abd Portable 2V  Result Date: 07/08/2020 CLINICAL DATA:  Epigastric pain.  Hypertension EXAM: PORTABLE ABDOMEN - 2 VIEW COMPARISON:  November 30, 2019. FINDINGS: Supine and upright images obtained. There is moderate stool throughout colon. There is no bowel dilatation or air-fluid to suggest bowel obstruction. No free air. There are occasional vascular calcifications in the pelvis. Lung bases are clear. IMPRESSION: Moderate stool in colon.  No bowel obstruction or free air. Electronically Signed   By: Lowella Grip III M.D.   On: 07/08/2020 11:25    Scheduled Meds: . amLODipine  5 mg Oral Daily  . carbamide peroxide  5 drop Both EARS BID  . ferrous sulfate  325 mg Oral BID WC  . gabapentin  100 mg Oral BID  . insulin aspart  0-5 Units Subcutaneous QHS  . insulin aspart  0-9 Units Subcutaneous TID WC  . isosorbide mononitrate  30 mg Oral Daily  . lactulose  30 g Oral BID  . metoprolol  200 mg Oral  Daily  . mometasone-formoterol  2 puff Inhalation BID  . pantoprazole  40 mg Oral BID  . polyethylene glycol  17 g Oral Daily  . rosuvastatin  40 mg Oral Daily  . sodium chloride flush  3 mL Intravenous Q12H  . tamsulosin  0.4 mg Oral QPC supper  . tiotropium  18 mcg Inhalation Daily  . torsemide  20 mg Oral Daily  . vitamin B-12  1,000 mcg Oral Daily  . Vitamin D (Ergocalciferol)  50,000 Units Oral Weekly   Continuous Infusions: . sodium chloride      Assessment/Plan:  1. Epigastric abdominal pain.  I ordered a GI cocktail and increased his Protonix up to twice daily  dosing.  X-ray flat and upright showed stool in the colon.  I ordered lactulose twice daily until bowel movement. 2. Acute on chronic hypoxic respiratory failure.  Initially the patient required BiPAP on coming in.  Now down to 2 L nasal cannula. 3. Acute diastolic congestive heart failure.  Patient initially had Lasix IV twice daily and this was switched over to torsemide by nephrology. 4. Iron deficiency anemia.  Patient responded to 1 unit of packed red blood cells.  I also gave IV iron.  Patient is guaiac positive and I held his Plavix.  B12 is slightly low and oral supplementation ordered. 5. Paroxysmal atrial fibrillation.  Metoprolol for rate control. 6. Type 2 diabetes mellitus with chronic kidney disease stage IV.  Last A1c 5.2.  Can hold off on medications.  We will get rid of sliding scale. 7. History of CAD and pulmonary hypertension.  Holding Plavix with patient being guaiac positive requiring transfusion.  Continue metoprolol 8. Essential hypertension on amlodipine and metoprolol 9. Cerumen impaction on Debrox otic solution.  Refer to ENT as outpatient 10. Weakness.  Physical therapy recommends rehab.  Transitional care team looking into options.      Code Status:     Code Status Orders  (From admission, onward)         Start     Ordered   07/04/20 1158  Do not attempt resuscitation (DNR)   Continuous       Question Answer Comment  In the event of cardiac or respiratory ARREST Do not call a "code blue"   In the event of cardiac or respiratory ARREST Do not perform Intubation, CPR, defibrillation or ACLS   In the event of cardiac or respiratory ARREST Use medication by any route, position, wound care, and other measures to relive pain and suffering. May use oxygen, suction and manual treatment of airway obstruction as needed for comfort.      07/04/20 1159        Code Status History    Date Active Date Inactive Code Status Order ID Comments User Context   04/04/2020 1514 04/16/2020 0357 DNR [id number]  Ivor Costa, MD Inpatient   04/04/2020 0845 04/04/2020 1514 DNR [id number]  Ivor Costa, MD ED   11/26/2019 1352 12/06/2019 0007 DNR [id number]  Toy Baker, MD ED   11/25/2019 2254 11/26/2019 1352 DNR [id number]  Toy Baker, MD ED   07/14/2019 1007 07/16/2019 1444 Full Code [id number]  Loletha Grayer, MD ED   05/23/2019 1908 05/25/2019 1443 DNR [id number]  Myra Rude, RN Inpatient   05/22/2019 1357 05/23/2019 1908 Full Code [id number]  Loletha Grayer, MD ED   02/09/2019 0410 02/12/2019 1734 Full Code [id number]  Arta Silence, MD Inpatient   01/21/2019 1852 01/23/2019 1954 DNR [id number]  Henreitta Leber, MD Inpatient   08/24/2018 0149 08/25/2018 1536 DNR [id number]  Amelia Jo, MD Inpatient   05/29/2018 1513 05/31/2018 1617 DNR [id number]  Gladstone Lighter, MD Inpatient   12/30/2017 1815 01/01/2018 1453 Full Code [id number]  Vaughan Basta, MD Inpatient   12/05/2017 2146 12/08/2017 1428 Full Code [id number]  Fritzi Mandes, MD Inpatient   11/13/2017 2123 11/16/2017 1704 Full Code [id number]  Idelle Crouch, MD Inpatient   09/22/2017 0036 09/24/2017 1837 Full Code [id number]  Salary, Avel Peace, MD Inpatient   09/22/2017 0033 09/22/2017 0036 DNR [id number]  Gorden Harms, MD ED   12/02/2016 0812 12/03/2016 2009 Full Code [id number]  Vaughan Basta, MD Inpatient    07/11/2016 0018 07/11/2016 1707 Full Code  [id number]  Lance Coon, MD Inpatient   06/18/2016 0340 06/21/2016 1844 Full Code [id number]  Saundra Shelling, MD ED   Advance Care Planning Activity    Advance Directive Documentation     Most Recent Value  Type of Advance Directive Out of facility DNR (pink MOST or yellow form)  Pre-existing out of facility DNR order (yellow form or pink MOST form) Physician notified to receive inpatient order  "MOST" Form in Place? --     Family Communication: Spoke with caregiver Disposition Plan: Status is: Inpatient   Dispo: The patient is from: Home with caregiver              Anticipated d/c is to: Rehab              Anticipated d/c date is: Next day or so depending on his epigastric pain and creatinine.              Patient currently today had new epigastric pain and treating with medications at this point  Consultants:  Nephrology  Time spent: 27 minutes  [place]

## 2020-07-08 NOTE — NC FL2 (Signed)
[place] LEVEL OF CARE SCREENING TOOL     IDENTIFICATION  Patient Name: Brett [name] Birthdate: 1935-01-12 Sex: male Admission Date (Current Location): 07/04/2020  [place] and [place] Number:  Engineering geologist and Address:  [facility], [address], [place], [place] [address]      Provider Number: [id number]  Attending Physician Name and Address:  Loletha Grayer, MD  Relative Name and Phone Number:       Current Level of Care: Hospital Recommended Level of Care: [place] Prior Approval Number:    Date Approved/Denied:   PASRR Number: [id number]  Discharge Plan: SNF    Current Diagnoses: Patient Active Problem List   Diagnosis Date Noted  . Epigastric abdominal pain   . Bilateral impacted cerumen   . Acute diastolic CHF (congestive heart failure) ([place]) 07/04/2020  . History of anemia due to CKD   . AF (paroxysmal atrial fibrillation) ([place])   . Hypotension 04/14/2020  . AKI (acute kidney injury) ([place])   . Goals of care, counseling/discussion   . Palliative care by specialist   . CHF, acute on chronic ([place]) 04/04/2020  . CKD (chronic kidney disease), stage IIIb 04/04/2020  . Type 2 diabetes mellitus with stage 4 chronic kidney disease ([place]) 04/04/2020  . HLD (hyperlipidemia) 04/04/2020  . Acute on chronic diastolic CHF (congestive heart failure) ([place]) 04/04/2020  . Iron deficiency anemia 04/04/2020  . Hypernatremia   . Dysphagia following unspecified cerebrovascular disease   . High serum osmolar gap 11/26/2019  . Atrial fibrillation, chronic ([place]) 11/25/2019  . Acute encephalopathy 11/25/2019  . Atrial fibrillation with RVR ([place]) 05/22/2019  . Acute on chronic respiratory failure with hypoxemia ([place]) 02/09/2019  . Pneumonia 01/21/2019  . Type 2 diabetes mellitus with hyperosmolar nonketotic hyperglycemia ([place]) 08/24/2018  . CHF exacerbation ([place]) 05/29/2018  . COPD exacerbation ([place])  12/05/2017  . CAP (community acquired pneumonia) 11/13/2017  . Acute respiratory failure with hypoxia ([place]) 11/13/2017  . SIRS (systemic inflammatory response syndrome) ([place]) 11/13/2017  . Stroke ([place]) 12/02/2016  . Stroke (cerebrum) ([place]) 12/02/2016  . Leg weakness, bilateral 09/03/2016  . Chronic diastolic heart failure ([place]) 08/06/2016  . Pleural effusion 08/05/2016  . Sepsis ([place]) 07/10/2016  . HCAP (healthcare-associated pneumonia) 07/10/2016  . Essential hypertension 07/10/2016  . Diabetes ([place]) 07/10/2016  . CAD (coronary artery disease) 07/10/2016  . COPD (chronic obstructive pulmonary disease) ([place]) 06/20/2016  . Chest pain 06/18/2016  . B-cell lymphoma ([place]) 10/14/2015    Orientation RESPIRATION BLADDER Height & Weight     Self, Time, Situation, Place (pt very hard of hearing)  O2 ( 2L) Continent Weight: 173 lb 12.8 oz (78.8 kg) Height:  5\' 10"  (177.8 cm)  BEHAVIORAL SYMPTOMS/MOOD NEUROLOGICAL BOWEL NUTRITION STATUS      Continent Diet (heart healthy/carb modified, thin liquids)  AMBULATORY STATUS COMMUNICATION OF NEEDS Skin   Extensive Assist Verbally Normal                       Personal Care Assistance Level of Assistance  Bathing, Feeding, Dressing Bathing Assistance: Maximum assistance Feeding assistance: Independent Dressing Assistance: Maximum assistance     Functional Limitations Info  Sight, Hearing, Speech Sight Info: Adequate Hearing Info:  (hard of hearing) Speech Info: Adequate    SPECIAL CARE FACTORS FREQUENCY  PT (By licensed PT), OT (By licensed OT)     PT Frequency: 5x OT Frequency: 5x  Contractures Contractures Info: Not present    Additional Factors Info  Code Status, Allergies Code Status Info: DNR Allergies Info: NO known allergies           Current Medications (07/08/2020):  This is the current hospital active medication list Current Facility-Administered Medications  Medication Dose Route Frequency  Provider Last Rate Last Admin  . 0.9 %  sodium chloride infusion  250 mL Intravenous PRN Agbata, Tochukwu, MD      . acetaminophen (TYLENOL) tablet 650 mg  650 mg Oral Q4H PRN Agbata, Tochukwu, MD      . amLODipine (NORVASC) tablet 5 mg  5 mg Oral Daily Agbata, Tochukwu, MD   5 mg at 07/08/20 0849  . carbamide peroxide (DEBROX) 6.5 % OTIC (EAR) solution 5 drop  5 drop Both EARS BID Loletha Grayer, MD   5 drop at 07/08/20 0846  . ferrous sulfate tablet 325 mg  325 mg Oral BID WC Agbata, Tochukwu, MD   325 mg at 07/08/20 0848  . gabapentin (NEURONTIN) capsule 100 mg  100 mg Oral BID Agbata, Tochukwu, MD   100 mg at 07/08/20 0849  . ipratropium-albuterol (DUONEB) 0.5-2.5 (3) MG/3ML nebulizer solution 3 mL  3 mL Inhalation Q6H PRN Agbata, Tochukwu, MD      . isosorbide mononitrate (IMDUR) 24 hr tablet 30 mg  30 mg Oral Daily Agbata, Tochukwu, MD   30 mg at 07/08/20 0850  . lactulose (CHRONULAC) 10 GM/15ML solution 30 g  30 g Oral BID Wieting, Richard, MD      . metoprolol succinate (TOPROL-XL) 24 hr tablet 200 mg  200 mg Oral Daily Agbata, Tochukwu, MD   200 mg at 07/08/20 0850  . mometasone-formoterol (DULERA) 100-5 MCG/ACT inhaler 2 puff  2 puff Inhalation BID Agbata, Tochukwu, MD   2 puff at 07/08/20 0845  . ondansetron (ZOFRAN) injection 4 mg  4 mg Intravenous Q6H PRN Agbata, Tochukwu, MD      . oxyCODONE (Oxy IR/ROXICODONE) immediate release tablet 5 mg  5 mg Oral Q4H PRN Wieting, Richard, MD      . pantoprazole (PROTONIX) EC tablet 40 mg  40 mg Oral BID Wieting, Richard, MD      . polyethylene glycol (MIRALAX / GLYCOLAX) packet 17 g  17 g Oral Daily Agbata, Tochukwu, MD   17 g at 07/08/20 0845  . rosuvastatin (CRESTOR) tablet 40 mg  40 mg Oral Daily Agbata, Tochukwu, MD   40 mg at 07/08/20 0847  . sodium chloride flush (NS) 0.9 % injection 3 mL  3 mL Intravenous Q12H Agbata, Tochukwu, MD   3 mL at 07/08/20 0851  . sodium chloride flush (NS) 0.9 % injection 3 mL  3 mL Intravenous PRN Agbata,  Tochukwu, MD      . tamsulosin (FLOMAX) capsule 0.4 mg  0.4 mg Oral QPC supper Agbata, Tochukwu, MD   0.4 mg at 07/07/20 1710  . tiotropium (SPIRIVA) inhalation capsule ([facility] use ONLY) 18 mcg  18 mcg Inhalation Daily Agbata, Tochukwu, MD   18 mcg at 07/08/20 0846  . torsemide (DEMADEX) tablet 20 mg  20 mg Oral Daily Kolluru, Sarath, MD   20 mg at 07/08/20 0850  . vitamin B-12 (CYANOCOBALAMIN) tablet 1,000 mcg  1,000 mcg Oral Daily Loletha Grayer, MD   1,000 mcg at 07/08/20 0850  . Vitamin D (Ergocalciferol) (DRISDOL) capsule 50,000 Units  50,000 Units Oral Weekly Agbata, Tochukwu, MD   50,000 Units at 07/05/20 6283   Facility-Administered Medications Ordered in Other Encounters  Medication Dose Route Frequency Provider Last Rate Last Admin  . sodium chloride flush (NS) 0.9 % injection 3 mL  3 mL Intravenous Q12H Jake Bathe, FNP         Discharge Medications: Please see discharge summary for a list of discharge medications.  Relevant Imaging Results:  Relevant Lab Results:   Additional Information SSN:[id number]  Gerrianne Scale Gehrig Patras, LCSW

## 2020-07-09 ENCOUNTER — Other Ambulatory Visit: Payer: Self-pay

## 2020-07-09 DIAGNOSIS — N1832 Chronic kidney disease, stage 3b: Secondary | ICD-10-CM

## 2020-07-09 DIAGNOSIS — D508 Other iron deficiency anemias: Secondary | ICD-10-CM

## 2020-07-09 LAB — BASIC METABOLIC PANEL
Anion gap: 12 (ref 5–15)
BUN: 71 mg/dL — ABNORMAL HIGH (ref 8–23)
CO2: 35 mmol/L — ABNORMAL HIGH (ref 22–32)
Calcium: 8.4 mg/dL — ABNORMAL LOW (ref 8.9–10.3)
Chloride: 95 mmol/L — ABNORMAL LOW (ref 98–111)
Creatinine, Ser: 2.7 mg/dL — ABNORMAL HIGH (ref 0.61–1.24)
GFR calc Af Amer: 24 mL/min — ABNORMAL LOW (ref >60)
GFR calc non Af Amer: 21 mL/min — ABNORMAL LOW (ref >60)
Glucose, Bld: 129 mg/dL — ABNORMAL HIGH (ref 70–99)
Potassium: 4 mmol/L (ref 3.5–5.1)
Sodium: 142 mmol/L (ref 135–145)

## 2020-07-09 NOTE — Plan of Care (Signed)
  Problem: Education: Goal: Knowledge of General Education information will improve Description: Including pain rating scale, medication(s)/side effects and non-pharmacologic comfort measures Outcome: Progressing   Problem: Safety: Goal: Ability to remain free from injury will improve Outcome: Progressing Note: Bed alarm set to middle setting, bed in the lowest position

## 2020-07-09 NOTE — Progress Notes (Signed)
[facility]  ROUNDING NOTE   Subjective:   Torsemide 20mg  PO daily.   Patient is hard of hearing and is unable to understand when spoken to. He states is "doing alright"  Objective:  Vital signs in last 24 hours:  Temp:  [97.6 F (36.4 C)-98.5 F (36.9 C)] 98 F (36.7 C) (08/11 0746) Pulse Rate:  [70-78] 70 (08/11 0746) Resp:  [18-23] 18 (08/11 0746) BP: (103-117)/(44-60) 112/48 (08/11 0746) SpO2:  [91 %-100 %] 91 % (08/11 0746)  Weight change:  Filed Weights   07/06/20 0434 07/07/20 0500 07/08/20 0616  Weight: 82.1 kg 82.3 kg 78.8 kg    Intake/Output: I/O last 3 completed shifts: In: 240 [P.O.:240] Out: 2350 [Urine:2350]   Intake/Output this shift:  Total I/O In: 363 [P.O.:360; I.V.:3] Out: -   Physical Exam: General: NAD, laying in bed  Head: Normocephalic, atraumatic. Moist oral mucosal membranes  Eyes: Anicteric, PERRL  Neck: Supple, trachea midline  Lungs:  clear  Heart: Regular rate and rhythm  Abdomen:  Soft, nontender,   Extremities:  no peripheral edema.  Neurologic: Moving all four extremities. +Hard of hearing  Skin: No lesions        Basic Metabolic Panel: Recent Labs  Lab 07/05/20 0438 07/05/20 0438 07/06/20 0705 07/06/20 0705 07/07/20 0611 07/08/20 0459 07/09/20 0626  NA 142  --  141  --  141 140 142  K 4.5  --  3.8  --  4.0 4.0 4.0  CL 102  --  97*  --  94* 94* 95*  CO2 30  --  32  --  34* 33* 35*  GLUCOSE 229*  --  95  --  103* 123* 129*  BUN 44*  --  61*  --  67* 71* 71*  CREATININE 2.73*  --  2.88*  --  2.96* 2.66* 2.70*  CALCIUM 7.6*   < > 7.5*   < > 7.7* 8.1* 8.4*   < > = values in this interval not displayed.    Liver Function Tests: Recent Labs  Lab 07/04/20 0912  AST 17  ALT 13  ALKPHOS 76  BILITOT 0.8  PROT 7.6  ALBUMIN 3.4*   No results for input(s): LIPASE, AMYLASE in the last 168 hours. No results for input(s): AMMONIA in the last 168 hours.  CBC: Recent Labs  Lab 07/04/20 0912  07/05/20 0438 07/06/20 0705 07/07/20 0611 07/08/20 0459  WBC 11.7* 8.5 8.3 7.0  --   NEUTROABS 9.7*  --   --   --   --   HGB 7.3* 6.4* 7.3* 8.3* 8.8*  HCT 24.3* 20.8* 23.9* 26.4*  --   MCV 92.7 90.4 90.9 88.6  --   PLT 119* 100* 127* 138*  --     Cardiac Enzymes: No results for input(s): CKTOTAL, CKMB, CKMBINDEX, TROPONINI in the last 168 hours.  BNP: Invalid input(s): POCBNP  CBG: Recent Labs  Lab 07/07/20 2103 07/08/20 0813 07/08/20 1121 07/08/20 1648 07/08/20 2121  GLUCAP 196* 113* 174* 146* 185*    Microbiology: Results for orders placed or performed during the hospital encounter of 07/04/20  SARS Coronavirus 2 by RT PCR (hospital order, performed in [facility] hospital lab) Nasopharyngeal Nasopharyngeal Swab     Status: None   Collection Time: 07/04/20  9:13 AM   Specimen: Nasopharyngeal Swab  Result Value Ref Range Status   SARS Coronavirus 2 NEGATIVE NEGATIVE Final    Comment: (NOTE) SARS-CoV-2 target nucleic acids are NOT DETECTED.  The SARS-CoV-2  RNA is generally detectable in upper and lower respiratory specimens during the acute phase of infection. The lowest concentration of SARS-CoV-2 viral copies this assay can detect is 250 copies / mL. A negative result does not preclude SARS-CoV-2 infection and should not be used as the sole basis for treatment or other patient management decisions.  A negative result may occur with improper specimen collection / handling, submission of specimen other than nasopharyngeal swab, presence of viral mutation(s) within the areas targeted by this assay, and inadequate number of viral copies (<250 copies / mL). A negative result must be combined with clinical observations, patient history, and epidemiological information.  Fact Sheet for Patients:   StrictlyIdeas.no  Fact Sheet for Healthcare Providers: BankingDealers.co.za  This test is not yet approved or  cleared by  the [place] FDA and has been authorized for detection and/or diagnosis of SARS-CoV-2 by FDA under an Emergency Use Authorization (EUA).  This EUA will remain in effect (meaning this test can be used) for the duration of the COVID-19 declaration under Section 564(b)(1) of the Act, 21 U.S.C. section 360bbb-3(b)(1), unless the authorization is terminated or revoked sooner.  Performed at [facility], [address] [place],  [address]     Coagulation Studies: No results for input(s): LABPROT, INR in the last 72 hours.  Urinalysis: No results for input(s): COLORURINE, LABSPEC, PHURINE, GLUCOSEU, HGBUR, BILIRUBINUR, KETONESUR, PROTEINUR, UROBILINOGEN, NITRITE, LEUKOCYTESUR in the last 72 hours.  Invalid input(s): APPERANCEUR    Imaging: DG Abd Portable 2V  Result Date: 07/08/2020 CLINICAL DATA:  Epigastric pain.  Hypertension EXAM: PORTABLE ABDOMEN - 2 VIEW COMPARISON:  November 30, 2019. FINDINGS: Supine and upright images obtained. There is moderate stool throughout colon. There is no bowel dilatation or air-fluid to suggest bowel obstruction. No free air. There are occasional vascular calcifications in the pelvis. Lung bases are clear. IMPRESSION: Moderate stool in colon.  No bowel obstruction or free air. Electronically Signed   By: Lowella Grip III M.D.   On: 07/08/2020 11:25     Medications:   . sodium chloride     . amLODipine  5 mg Oral Daily  . carbamide peroxide  5 drop Both EARS BID  . ferrous sulfate  325 mg Oral BID WC  . gabapentin  100 mg Oral BID  . isosorbide mononitrate  30 mg Oral Daily  . lactulose  30 g Oral BID  . metoprolol  200 mg Oral Daily  . mometasone-formoterol  2 puff Inhalation BID  . pantoprazole  40 mg Oral BID  . polyethylene glycol  17 g Oral Daily  . rosuvastatin  40 mg Oral Daily  . sodium chloride flush  3 mL Intravenous Q12H  . tamsulosin  0.4 mg Oral QPC supper  . tiotropium  18 mcg Inhalation Daily  .  torsemide  20 mg Oral Daily  . vitamin B-12  1,000 mcg Oral Daily  . Vitamin D (Ergocalciferol)  50,000 Units Oral Weekly   sodium chloride, acetaminophen, ipratropium-albuterol, ondansetron (ZOFRAN) IV, oxyCODONE, sodium chloride flush  Assessment/ Plan:  [name] is a 85 y.o. white male with B-cell lymphoma, aortic regurgitation, coronary artery disease, congestive heart failure, diabetes mellitus type II, COPD, hypertension, peptic ulcer disease, peripheral vascular disease and hearing loss who was admitted to [facility] on 07/04/2020 for Acute respiratory failure with hypoxia ([place]) [I94.85] Acute diastolic CHF (congestive heart failure) ([place]) [I50.31] Acute on chronic congestive heart failure, unspecified heart failure type ([place]) [I50.9]  1.  Acute renal failure on chronic kidney disease stage IIIB with proteinuria: baseline creatinine of 1.79, GFR of 35 on 12/05/19. However suspect some level of progression of chronic kidney disease Chronic Kidney disease secondary to diabetes and hypertension Acute renal failure secondary to acute cardiorenal syndrome.  No acute indication for dialysis.  Not on an ACE-I/ARB at home.   2. Hypertension with acute exacerbation of diastolic congestive heart failure. Now with metabolic alkalosis.  - Continue: torsemide 20mg  PO daily - Continue home regimen of amlodipine, isosorbide mononitrate, metoprolol and tamsulosin.   3. Anemia with chronic kidney disease: with iron deficiency: normocytic. Status post PRBC transfusion.  Hemoglobin 8.8 Not currently a candidate for ESA with malignancy.  With thrombocytopenia IV iron 400mg  on 8/8.  - continue PO iron supplements.   4. Diabetes mellitus type II with chronic kidney disease: insulin dependent. Hemoglobin A1c of 5.2%.    LOS: 5 Brett [name] 8/11/202110:47 AM

## 2020-07-09 NOTE — Progress Notes (Signed)
Mobility Specialist - Progress Note   07/09/20 1503  Mobility  Activity Refused mobility  Mobility performed by Mobility specialist    Pt was sleeping in bed upon arrival. Pt was easily awakened, but would not keep his eyes open to acknowledge mobility tech. Pt politely declined mobility by shaking his head no. Will attempt session at a later time.   Kathee Delton Mobility Specialist 07/09/20, 3:06 PM

## 2020-07-09 NOTE — Progress Notes (Signed)
Physical Therapy Treatment Patient Details Name: Brett [name] MRN: [id number] DOB: [birth year] Today's Date: 07/09/2020    History of Present Illness 85 y.o. white male with B-cell lymphoma, aortic regurgitation, coronary artery disease, congestive heart failure, diabetes mellitus type II, COPD, hypertension, peptic ulcer disease, peripheral vascular disease and hearing loss who was admitted to [facility]  for Acute respiratory failure with hypoxia Iron deficiency anemia, acute diastolic CHF, requiring packed red blood cells and IV iron. Very hard of hearing     PT Comments    Pt extremely HOH/deaf, all cueing visual and tactile to facilitate session. Pt with improved mobility, transferred to EOB with supervision, returned to supine supervision. Sit <> stand with CGAx2 and RW, able to statically stand for 1-37minutes to allow PT to clean pt after BM, and then did some standing marching. Pt did desaturate after activity and upon returning to supine. The patient would benefit from further skilled PT intervention to continue to progress towards goals. Recommendation remains appropriate due to pt deconditioning and to maximize safety at home.     Follow Up Recommendations  SNF     Equipment Recommendations  Other (comment)    Recommendations for Other Services       Precautions / Restrictions Precautions Precautions: Fall Restrictions Weight Bearing Restrictions: No    Mobility  Bed Mobility   Bed Mobility: Supine to Sit;Sit to Supine     Supine to sit: Min guard Sit to supine: Min guard   General bed mobility comments: visual and tactile cues provided due to pt extremely HOH  Transfers Overall transfer level: Needs assistance Equipment used: Rolling walker (2 wheeled) Transfers: Sit to/from Stand Sit to Stand: Min guard         General transfer comment: pt able to stand with CGA  Ambulation/Gait             General Gait Details: Pt able to stand and march in place  with CGA, able to statically stand to allow PT to clean pt after BM   Stairs             Wheelchair Mobility    Modified Rankin (Stroke Patients Only)       Balance Overall balance assessment: Needs assistance Sitting-balance support: Feet supported;Single extremity supported Sitting balance-Leahy Scale: Fair       Standing balance-Leahy Scale: Fair                              Cognition Arousal/Alertness: Awake/alert Behavior During Therapy: WFL for tasks assessed/performed Overall Cognitive Status: Difficult to assess                                 General Comments: difficult to assess given severity of hearing loss. Verbal cues and gestures used for instruction with mobility       Exercises General Exercises - Lower Extremity Long Arc Quad: AROM;Both;10 reps;Strengthening Hip Flexion/Marching: Seated;Standing;Both;10 reps    General Comments        Pertinent Vitals/Pain Pain Assessment: No/denies pain    Home Living                      Prior Function            PT Goals (current goals can now be found in the care plan section) Progress towards PT goals: Progressing toward goals  Frequency    Min 2X/week      PT Plan Current plan remains appropriate    Co-evaluation              AM-PAC PT "6 Clicks" Mobility   Outcome Measure  Help needed turning from your back to your side while in a flat bed without using bedrails?: A Little Help needed moving from lying on your back to sitting on the side of a flat bed without using bedrails?: A Little Help needed moving to and from a bed to a chair (including a wheelchair)?: A Little Help needed standing up from a chair using your arms (e.g., wheelchair or bedside chair)?: A Little Help needed to walk in hospital room?: A Little Help needed climbing 3-5 steps with a railing? : Total 6 Click Score: 16    End of Session Equipment Utilized During  Treatment: Oxygen Activity Tolerance: Patient tolerated treatment well Patient left: in bed;with call bell/phone within reach;with bed alarm set Nurse Communication: Mobility status PT Visit Diagnosis: Unsteadiness on feet (R26.81);Muscle weakness (generalized) (M62.81);Difficulty in walking, not elsewhere classified (R26.2)     Time: 3570-1779 PT Time Calculation (min) (ACUTE ONLY): 15 min  Charges:  $Therapeutic Exercise: 8-22 mins                     Lieutenant Diego PT, DPT 4:06 PM,07/09/20

## 2020-07-09 NOTE — Care Management Important Message (Signed)
Important Message  Patient Details  Name: Brett [name] MRN: [id number] Date of Birth: 08/30/1935   Medicare Important Message Given:  Yes     Loann Quill 07/09/2020, 11:16 AM

## 2020-07-09 NOTE — Progress Notes (Addendum)
PROGRESS NOTE    Brett [name]  HYQ:[id number] DOB: [birth year] DOA: 07/04/2020 PCP: Perrin Maltese, MD    Assessment & Plan:   Principal Problem:   Acute on chronic diastolic CHF (congestive heart failure) ([place]) Active Problems:   COPD (chronic obstructive pulmonary disease) ([place])   Essential hypertension   Acute respiratory failure with hypoxia (HCC)   Type 2 diabetes mellitus with stage 4 chronic kidney disease (HCC)   History of anemia due to CKD   AF (paroxysmal atrial fibrillation) (HCC)   Bilateral impacted cerumen   Epigastric abdominal pain  Epigastric abdominal pain: continue on protonix.  X-ray flat and upright showed stool in the colon.  Continue on lactulose.  Acute on chronic hypoxic respiratory failure: initially the patient required BiPAP on admission. Continue on supplemental oxygen, currently on 2L [place]. Encourage incentive spirometry   Acute diastolic congestive heart failure: continue on torsemide. Monitor I/Os.   Iron deficiency anemia: s/p 1 unit of pRBCs & IV iron. Continue on po iron supplements. Guaiac positive and continue to hold plavix.   PAF: continue on metoprolol. Likely not on anticoagulation secondary to high fall risk   DM2: last HbA1c 5.2. Continue on SSI w/ accuchecks.   CKDIIIb: as per nephro. Cr is labile. Baseline Cr is 1.79.  Will continue to monitor   Thrombocytopenia: etiology unclear. Will continue to monitor  History of CAD and pulmonary hypertension: continue to hold plavix w/ guaiac positive requiring transfusion.  Continue metoprolol  HTN: continue on amlodipine and metoprolol  Cerumen impaction: continue on debrox otic solution.  Refer to ENT as outpatient  Weakness: PT recs SNF   DVT prophylaxis: SCDs Code Status: DNR Family Communication:  Disposition Plan:  Status is: Inpatient  Remains inpatient appropriate because:Unsafe d/c plan   Dispo: The patient is from: Home              Anticipated d/c is to: SNF               Anticipated d/c date is: 3 days              Patient currently is medically stable to d/c.        Consultants:   nephro    Procedures:    Antimicrobials:   Subjective: Pt c/o fatigue  Objective: Vitals:   07/08/20 1947 07/09/20 0438 07/09/20 0555 07/09/20 0746  BP: 115/60 (!) 103/44 (!) 105/52 (!) 112/48  Pulse: 70 73 78 70  Resp: (!) 23 20  18   Temp: 97.6 F (36.4 C) 98.5 F (36.9 C)  98 F (36.7 C)  TempSrc: Oral Oral    SpO2: 98% 100%  91%  Weight:      Height:        Intake/Output Summary (Last 24 hours) at 07/09/2020 0807 Last data filed at 07/08/2020 1828 Gross per 24 hour  Intake 240 ml  Output 850 ml  Net -610 ml   Filed Weights   07/06/20 0434 07/07/20 0500 07/08/20 0616  Weight: 82.1 kg 82.3 kg 78.8 kg    Examination:  General exam: Appears lethargic Respiratory system: decreased breath sounds b/l. No rales Cardiovascular system: S1 & S2 +. No rubs, gallops or clicks. Gastrointestinal system: Abdomen is nondistended, soft and nontender. Normal bowel sounds heard. Central nervous system: Lethargic. Moves all 4 extremities  Psychiatry: Judgement and insight appear abnormal. Flat mood and affect.     Data Reviewed: I have personally reviewed following labs and imaging studies  CBC: Recent  Labs  Lab 07/04/20 0912 07/05/20 0438 07/06/20 0705 07/07/20 0611 07/08/20 0459  WBC 11.7* 8.5 8.3 7.0  --   NEUTROABS 9.7*  --   --   --   --   HGB 7.3* 6.4* 7.3* 8.3* 8.8*  HCT 24.3* 20.8* 23.9* 26.4*  --   MCV 92.7 90.4 90.9 88.6  --   PLT 119* 100* 127* 138*  --    Basic Metabolic Panel: Recent Labs  Lab 07/05/20 0438 07/06/20 0705 07/07/20 0611 07/08/20 0459 07/09/20 0626  NA 142 141 141 140 142  K 4.5 3.8 4.0 4.0 4.0  CL 102 97* 94* 94* 95*  CO2 30 32 34* 33* 35*  GLUCOSE 229* 95 103* 123* 129*  BUN 44* 61* 67* 71* 71*  CREATININE 2.73* 2.88* 2.96* 2.66* 2.70*  CALCIUM 7.6* 7.5* 7.7* 8.1* 8.4*   GFR: Estimated  Creatinine Clearance: 21.4 mL/min (A) (by C-G formula based on SCr of 2.7 mg/dL (H)). Liver Function Tests: Recent Labs  Lab 07/04/20 0912  AST 17  ALT 13  ALKPHOS 76  BILITOT 0.8  PROT 7.6  ALBUMIN 3.4*   No results for input(s): LIPASE, AMYLASE in the last 168 hours. No results for input(s): AMMONIA in the last 168 hours. Coagulation Profile: No results for input(s): INR, PROTIME in the last 168 hours. Cardiac Enzymes: No results for input(s): CKTOTAL, CKMB, CKMBINDEX, TROPONINI in the last 168 hours. BNP (last 3 results) No results for input(s): PROBNP in the last 8760 hours. HbA1C: No results for input(s): HGBA1C in the last 72 hours. CBG: Recent Labs  Lab 07/07/20 2103 07/08/20 0813 07/08/20 1121 07/08/20 1648 07/08/20 2121  GLUCAP 196* 113* 174* 146* 185*   Lipid Profile: No results for input(s): CHOL, HDL, LDLCALC, TRIG, CHOLHDL, LDLDIRECT in the last 72 hours. Thyroid Function Tests: No results for input(s): TSH, T4TOTAL, FREET4, T3FREE, THYROIDAB in the last 72 hours. Anemia Panel: No results for input(s): VITAMINB12, FOLATE, FERRITIN, TIBC, IRON, RETICCTPCT in the last 72 hours. Sepsis Labs: Recent Labs  Lab 07/04/20 0912 07/04/20 1508 07/05/20 0438 07/06/20 0705  PROCALCITON  --  <0.10 0.14 0.11  LATICACIDVEN 0.9 1.4  --   --     Recent Results (from the past 240 hour(s))  SARS Coronavirus 2 by RT PCR (hospital order, performed in [facility] hospital lab) Nasopharyngeal Nasopharyngeal Swab     Status: None   Collection Time: 07/04/20  9:13 AM   Specimen: Nasopharyngeal Swab  Result Value Ref Range Status   SARS Coronavirus 2 NEGATIVE NEGATIVE Final    Comment: (NOTE) SARS-CoV-2 target nucleic acids are NOT DETECTED.  The SARS-CoV-2 RNA is generally detectable in upper and lower respiratory specimens during the acute phase of infection. The lowest concentration of SARS-CoV-2 viral copies this assay can detect is 250 copies / mL. A negative  result does not preclude SARS-CoV-2 infection and should not be used as the sole basis for treatment or other patient management decisions.  A negative result may occur with improper specimen collection / handling, submission of specimen other than nasopharyngeal swab, presence of viral mutation(s) within the areas targeted by this assay, and inadequate number of viral copies (<250 copies / mL). A negative result must be combined with clinical observations, patient history, and epidemiological information.  Fact Sheet for Patients:   StrictlyIdeas.no  Fact Sheet for Healthcare Providers: BankingDealers.co.za  This test is not yet approved or  cleared by the [place] FDA and has been authorized for detection and/or  diagnosis of SARS-CoV-2 by FDA under an Emergency Use Authorization (EUA).  This EUA will remain in effect (meaning this test can be used) for the duration of the COVID-19 declaration under Section 564(b)(1) of the Act, 21 U.S.C. section 360bbb-3(b)(1), unless the authorization is terminated or revoked sooner.  Performed at [facility], [address], [place], [place] [address]          Radiology Studies: DG Abd Portable 2V  Result Date: 07/08/2020 CLINICAL DATA:  Epigastric pain.  Hypertension EXAM: PORTABLE ABDOMEN - 2 VIEW COMPARISON:  November 30, 2019. FINDINGS: Supine and upright images obtained. There is moderate stool throughout colon. There is no bowel dilatation or air-fluid to suggest bowel obstruction. No free air. There are occasional vascular calcifications in the pelvis. Lung bases are clear. IMPRESSION: Moderate stool in colon.  No bowel obstruction or free air. Electronically Signed   By: Lowella Grip III M.D.   On: 07/08/2020 11:25        Scheduled Meds: . amLODipine  5 mg Oral Daily  . carbamide peroxide  5 drop Both EARS BID  . ferrous sulfate  325 mg Oral BID WC  . gabapentin   100 mg Oral BID  . isosorbide mononitrate  30 mg Oral Daily  . lactulose  30 g Oral BID  . metoprolol  200 mg Oral Daily  . mometasone-formoterol  2 puff Inhalation BID  . pantoprazole  40 mg Oral BID  . polyethylene glycol  17 g Oral Daily  . rosuvastatin  40 mg Oral Daily  . sodium chloride flush  3 mL Intravenous Q12H  . tamsulosin  0.4 mg Oral QPC supper  . tiotropium  18 mcg Inhalation Daily  . torsemide  20 mg Oral Daily  . vitamin B-12  1,000 mcg Oral Daily  . Vitamin D (Ergocalciferol)  50,000 Units Oral Weekly   Continuous Infusions: . sodium chloride       LOS: 5 days    Time spent: 30 mins     Wyvonnia Dusky, MD [facility] Pager [phone]  If 7PM-7AM, please contact night-coverage [email or web address] 07/09/2020, 8:07 AM

## 2020-07-10 LAB — CBC
HCT: 28.6 % — ABNORMAL LOW (ref 39.0–52.0)
Hemoglobin: 8.8 g/dL — ABNORMAL LOW (ref 13.0–17.0)
MCH: 28.1 pg (ref 26.0–34.0)
MCHC: 30.8 g/dL (ref 30.0–36.0)
MCV: 91.4 fL (ref 80.0–100.0)
Platelets: 144 10*3/uL — ABNORMAL LOW (ref 150–400)
RBC: 3.13 MIL/uL — ABNORMAL LOW (ref 4.22–5.81)
RDW: 15.5 % (ref 11.5–15.5)
WBC: 8.4 10*3/uL (ref 4.0–10.5)
nRBC: 0 % (ref 0.0–0.2)

## 2020-07-10 LAB — BASIC METABOLIC PANEL
Anion gap: 13 (ref 5–15)
BUN: 83 mg/dL — ABNORMAL HIGH (ref 8–23)
CO2: 33 mmol/L — ABNORMAL HIGH (ref 22–32)
Calcium: 8.5 mg/dL — ABNORMAL LOW (ref 8.9–10.3)
Chloride: 93 mmol/L — ABNORMAL LOW (ref 98–111)
Creatinine, Ser: 3.26 mg/dL — ABNORMAL HIGH (ref 0.61–1.24)
GFR calc Af Amer: 19 mL/min — ABNORMAL LOW (ref >60)
GFR calc non Af Amer: 17 mL/min — ABNORMAL LOW (ref >60)
Glucose, Bld: 136 mg/dL — ABNORMAL HIGH (ref 70–99)
Potassium: 4.1 mmol/L (ref 3.5–5.1)
Sodium: 139 mmol/L (ref 135–145)

## 2020-07-10 NOTE — Progress Notes (Signed)
PROGRESS NOTE    Brett [name]  IRS:[id number] DOB: 09/13/1935 DOA: 07/04/2020 PCP: Perrin Maltese, MD    Assessment & Plan:   Principal Problem:   Acute on chronic diastolic CHF (congestive heart failure) ([place]) Active Problems:   COPD (chronic obstructive pulmonary disease) ([place])   Essential hypertension   Acute respiratory failure with hypoxia (HCC)   Type 2 diabetes mellitus with stage 4 chronic kidney disease (HCC)   History of anemia due to CKD   AF (paroxysmal atrial fibrillation) (HCC)   Bilateral impacted cerumen   Epigastric abdominal pain  Epigastric abdominal pain: continue on protonix.  X-ray flat and upright showed stool in the colon.  Continue on lactulose.  Acute on chronic hypoxic respiratory failure: initially the patient required BiPAP on admission but it has since been weaned off. Continue on supplemental oxygen, currently on 2L [place]. Encourage incentive spirometry   Acute diastolic congestive heart failure: continue on torsemide. Monitor I/Os.   Iron deficiency anemia: s/p 1 unit of pRBCs & IV iron. Continue on po iron supplements. Guaiac positive and continue to hold plavix. H&H are stable   PAF: continue on metoprolol. Likely not on anticoagulation secondary to high fall risk   DM2: last HbA1c 5.2. Continue on SSI w/ accuchecks.   CKDIIIb: as per nephro. Cr is labile. Baseline Cr is 1.79.  Will continue to monitor   Thrombocytopenia: etiology unclear. Trending up today. Will continue to monitor  History of CAD and pulmonary hypertension: continue to hold plavix w/ guaiac positive requiring transfusion.  Continue metoprolol  HTN: continue on amlodipine and metoprolol  Cerumen impaction: continue on debrox otic solution.  Refer to ENT as outpatient  Weakness: PT recs SNF   DVT prophylaxis: SCDs Code Status: DNR Family Communication:  Disposition Plan: will d/c to SNF. CM is working on this   Status is: Inpatient  Remains inpatient appropriate  because:Unsafe d/c plan. CM working on placement to SNF    Dispo: The patient is from: Home              Anticipated d/c is to: SNF              Anticipated d/c date is: 3 days              Patient currently is medically stable to d/c.    Consultants:   nephro    Procedures:    Antimicrobials:   Subjective: Pt c/o malaise and difficulty hearing.   Objective: Vitals:   07/09/20 2101 07/09/20 2102 07/10/20 0421 07/10/20 0722  BP: (!) 105/51  (!) 115/50 (!) 103/54  Pulse: 77 77 75 70  Resp: 20  19 16   Temp: 97.9 F (36.6 C)  98.4 F (36.9 C) 98.1 F (36.7 C)  TempSrc: Oral  Oral Oral  SpO2: 90% 90% 100% 99%  Weight:   78.6 kg   Height:        Intake/Output Summary (Last 24 hours) at 07/10/2020 0801 Last data filed at 07/10/2020 0721 Gross per 24 hour  Intake 963 ml  Output 2000 ml  Net -1037 ml   Filed Weights   07/07/20 0500 07/08/20 0616 07/10/20 0421  Weight: 82.3 kg 78.8 kg 78.6 kg    Examination:  General exam: Appears lethargic Respiratory system: diminished breath sounds b/l. No wheezes Cardiovascular system: S1 & S2 +. No rubs, gallops or clicks. Gastrointestinal system: Abdomen is nondistended, soft and nontender. Hypoactive bowel sounds heard. Central nervous system: Lethargic. Moves all 4  extremities  Psychiatry: Judgement and insight appear abnormal. Flat mood and affect.   Data Reviewed: I have personally reviewed following labs and imaging studies  CBC: Recent Labs  Lab 07/04/20 0912 07/04/20 0912 07/05/20 0438 07/06/20 0705 07/07/20 0611 07/08/20 0459 07/10/20 0637  WBC 11.7*  --  8.5 8.3 7.0  --  8.4  NEUTROABS 9.7*  --   --   --   --   --   --   HGB 7.3*   < > 6.4* 7.3* 8.3* 8.8* 8.8*  HCT 24.3*  --  20.8* 23.9* 26.4*  --  28.6*  MCV 92.7  --  90.4 90.9 88.6  --  91.4  PLT 119*  --  100* 127* 138*  --  144*   < > = values in this interval not displayed.   Basic Metabolic Panel: Recent Labs  Lab 07/06/20 0705  07/07/20 0611 07/08/20 0459 07/09/20 0626 07/10/20 0637  NA 141 141 140 142 139  K 3.8 4.0 4.0 4.0 4.1  CL 97* 94* 94* 95* 93*  CO2 32 34* 33* 35* 33*  GLUCOSE 95 103* 123* 129* 136*  BUN 61* 67* 71* 71* 83*  CREATININE 2.88* 2.96* 2.66* 2.70* 3.26*  CALCIUM 7.5* 7.7* 8.1* 8.4* 8.5*   GFR: Estimated Creatinine Clearance: 17.7 mL/min (A) (by C-G formula based on SCr of 3.26 mg/dL (H)). Liver Function Tests: Recent Labs  Lab 07/04/20 0912  AST 17  ALT 13  ALKPHOS 76  BILITOT 0.8  PROT 7.6  ALBUMIN 3.4*   No results for input(s): LIPASE, AMYLASE in the last 168 hours. No results for input(s): AMMONIA in the last 168 hours. Coagulation Profile: No results for input(s): INR, PROTIME in the last 168 hours. Cardiac Enzymes: No results for input(s): CKTOTAL, CKMB, CKMBINDEX, TROPONINI in the last 168 hours. BNP (last 3 results) No results for input(s): PROBNP in the last 8760 hours. HbA1C: No results for input(s): HGBA1C in the last 72 hours. CBG: Recent Labs  Lab 07/07/20 2103 07/08/20 0813 07/08/20 1121 07/08/20 1648 07/08/20 2121  GLUCAP 196* 113* 174* 146* 185*   Lipid Profile: No results for input(s): CHOL, HDL, LDLCALC, TRIG, CHOLHDL, LDLDIRECT in the last 72 hours. Thyroid Function Tests: No results for input(s): TSH, T4TOTAL, FREET4, T3FREE, THYROIDAB in the last 72 hours. Anemia Panel: No results for input(s): VITAMINB12, FOLATE, FERRITIN, TIBC, IRON, RETICCTPCT in the last 72 hours. Sepsis Labs: Recent Labs  Lab 07/04/20 0912 07/04/20 1508 07/05/20 0438 07/06/20 0705  PROCALCITON  --  <0.10 0.14 0.11  LATICACIDVEN 0.9 1.4  --   --     Recent Results (from the past 240 hour(s))  SARS Coronavirus 2 by RT PCR (hospital order, performed in [facility] hospital lab) Nasopharyngeal Nasopharyngeal Swab     Status: None   Collection Time: 07/04/20  9:13 AM   Specimen: Nasopharyngeal Swab  Result Value Ref Range Status   SARS Coronavirus 2 NEGATIVE  NEGATIVE Final    Comment: (NOTE) SARS-CoV-2 target nucleic acids are NOT DETECTED.  The SARS-CoV-2 RNA is generally detectable in upper and lower respiratory specimens during the acute phase of infection. The lowest concentration of SARS-CoV-2 viral copies this assay can detect is 250 copies / mL. A negative result does not preclude SARS-CoV-2 infection and should not be used as the sole basis for treatment or other patient management decisions.  A negative result may occur with improper specimen collection / handling, submission of specimen other than nasopharyngeal swab, presence of  viral mutation(s) within the areas targeted by this assay, and inadequate number of viral copies (<250 copies / mL). A negative result must be combined with clinical observations, patient history, and epidemiological information.  Fact Sheet for Patients:   StrictlyIdeas.no  Fact Sheet for Healthcare Providers: BankingDealers.co.za  This test is not yet approved or  cleared by the [place] FDA and has been authorized for detection and/or diagnosis of SARS-CoV-2 by FDA under an Emergency Use Authorization (EUA).  This EUA will remain in effect (meaning this test can be used) for the duration of the COVID-19 declaration under Section 564(b)(1) of the Act, 21 U.S.C. section 360bbb-3(b)(1), unless the authorization is terminated or revoked sooner.  Performed at [facility], [address] [place], [place] [address]          Radiology Studies: DG Abd Portable 2V  Result Date: 07/08/2020 CLINICAL DATA:  Epigastric pain.  Hypertension EXAM: PORTABLE ABDOMEN - 2 VIEW COMPARISON:  November 30, 2019. FINDINGS: Supine and upright images obtained. There is moderate stool throughout colon. There is no bowel dilatation or air-fluid to suggest bowel obstruction. No free air. There are occasional vascular calcifications in the pelvis. Lung bases are  clear. IMPRESSION: Moderate stool in colon.  No bowel obstruction or free air. Electronically Signed   By: Lowella Grip III M.D.   On: 07/08/2020 11:25        Scheduled Meds: . amLODipine  5 mg Oral Daily  . carbamide peroxide  5 drop Both EARS BID  . ferrous sulfate  325 mg Oral BID WC  . gabapentin  100 mg Oral BID  . isosorbide mononitrate  30 mg Oral Daily  . lactulose  30 g Oral BID  . metoprolol  200 mg Oral Daily  . mometasone-formoterol  2 puff Inhalation BID  . pantoprazole  40 mg Oral BID  . polyethylene glycol  17 g Oral Daily  . rosuvastatin  40 mg Oral Daily  . sodium chloride flush  3 mL Intravenous Q12H  . tamsulosin  0.4 mg Oral QPC supper  . tiotropium  18 mcg Inhalation Daily  . torsemide  20 mg Oral Daily  . vitamin B-12  1,000 mcg Oral Daily  . Vitamin D (Ergocalciferol)  50,000 Units Oral Weekly   Continuous Infusions: . sodium chloride       LOS: 6 days    Time spent: 33 mins     Wyvonnia Dusky, MD [facility] Pager [phone]  If 7PM-7AM, please contact night-coverage [email or web address] 07/10/2020, 8:01 AM

## 2020-07-10 NOTE — Progress Notes (Signed)
Physical Therapy Treatment Patient Details Name: Brett [name] MRN: [id number] DOB: 03-10-1935 Today's Date: 07/10/2020    History of Present Illness 85 y.o. white male with B-cell lymphoma, aortic regurgitation, coronary artery disease, congestive heart failure, diabetes mellitus type II, COPD, hypertension, peptic ulcer disease, peripheral vascular disease and hearing loss who was admitted to [facility]  for Acute respiratory failure with hypoxia Iron deficiency anemia, acute diastolic CHF, requiring packed red blood cells and IV iron. Very hard of hearing     PT Comments    Pt easily woken, very HOH mostly gestures and visual/tactile cues to assist/facilitate session. 2+ for equipment management, but pt was able to sit EOB with supervision, sit <> stand with RW and CGA, and ambulated ~43ft with RW and CGA. Pt on 2L throughout, one instance of desaturation to 86%, pt exhibited the ability to self direct PLB and improved to 90%. Pt up in chair with all needs in reach at end of session. The patient would benefit from further skilled PT intervention to continue to progress towards goals. Recommendation updated at this time tp HHPT supervision/assistance 24/7, PT spoke with RN, MD and family to confirm PLOF and adequate assistance at home.     Follow Up Recommendations  Home health PT;Supervision/Assistance - 24 hour     Equipment Recommendations  Rolling walker with 5" wheels    Recommendations for Other Services       Precautions / Restrictions      Mobility  Bed Mobility Overal bed mobility: Needs Assistance Bed Mobility: Supine to Sit;Sit to Supine     Supine to sit: Min guard        Transfers Overall transfer level: Needs assistance Equipment used: Rolling walker (2 wheeled) Transfers: Sit to/from Stand Sit to Stand: Min guard            Ambulation/Gait   Gait Distance (Feet): 60 Feet Assistive device: Rolling walker (2 wheeled) Gait Pattern/deviations: Narrow  base of support     General Gait Details: reliant on RW for balance but able to ambulate CGA. spO2 monitored continuously, spO2 to 86% once, pt performed PLB and returned >90% on 2L.   Stairs             Wheelchair Mobility    Modified Rankin (Stroke Patients Only)       Balance Overall balance assessment: Needs assistance Sitting-balance support: Feet supported;Single extremity supported Sitting balance-Leahy Scale: Fair       Standing balance-Leahy Scale: Fair Standing balance comment: more comfortable with UE support                            Cognition Arousal/Alertness: Awake/alert Behavior During Therapy: WFL for tasks assessed/performed Overall Cognitive Status: Difficult to assess                                 General Comments: difficult to assess given severity of hearing loss. Verbal cues and gestures used for instruction with mobility       Exercises      General Comments        Pertinent Vitals/Pain Pain Assessment: No/denies pain    Home Living Family/patient expects to be discharged to:: Private residence Living Arrangements: Other relatives Available Help at Discharge: Friend(s);Neighbor Type of Home: House Home Access: Stairs to enter Entrance Stairs-Rails: Left Home Layout: One level Home Equipment: Environmental consultant - 2 wheels;Cane -  single point;Toilet riser      Prior Function Level of Independence: Independent      Comments: no falls in the last 6 months   PT Goals (current goals can now be found in the care plan section) Progress towards PT goals: Progressing toward goals    Frequency    Min 2X/week      PT Plan Discharge plan needs to be updated    Co-evaluation              AM-PAC PT "6 Clicks" Mobility   Outcome Measure  Help needed turning from your back to your side while in a flat bed without using bedrails?: A Little Help needed moving from lying on your back to sitting on the side  of a flat bed without using bedrails?: A Little Help needed moving to and from a bed to a chair (including a wheelchair)?: A Little Help needed standing up from a chair using your arms (e.g., wheelchair or bedside chair)?: A Little Help needed to walk in hospital room?: A Little Help needed climbing 3-5 steps with a railing? : A Little 6 Click Score: 18    End of Session Equipment Utilized During Treatment: Oxygen;Gait belt Activity Tolerance: Patient tolerated treatment well Patient left: in chair;with chair alarm set;with call bell/phone within reach Nurse Communication: Mobility status PT Visit Diagnosis: Unsteadiness on feet (R26.81);Muscle weakness (generalized) (M62.81);Difficulty in walking, not elsewhere classified (R26.2)     Time: 7622-6333 PT Time Calculation (min) (ACUTE ONLY): 24 min  Charges:  $Therapeutic Exercise: 23-37 mins                     Lieutenant Diego PT, DPT 2:14 PM,07/10/20

## 2020-07-10 NOTE — TOC Progression Note (Signed)
Transition of Care [facility]) - Progression Note    Patient Details  Name: RAEQWON LUX MRN: [id number] Date of Birth: [birth year]  Transition of Care [facility]) CM/SW [place], RN Phone Number: 07/10/2020, 10:06 AM  Clinical Narrative:    Caregiver accepted bed at [facility].  Ship broker started.     Expected Discharge Plan: [place] Barriers to Discharge: Continued Medical Work up, Ship broker  Expected Discharge Plan and Services Expected Discharge Plan: [place] In-house Referral: Clinical Social Work   Post Acute Care Choice: [place] Living arrangements for the past 2 months: [place] Determinants of Health (SDOH) Interventions    Readmission Risk Interventions Readmission Risk Prevention Plan 07/05/2020 04/09/2020 12/05/2019  Transportation Screening - Complete -  Medication Review Press photographer) - Referral to Pharmacy -  PCP or Specialist appointment within 3-5 days of discharge - Complete Complete  HRI or Home Care Consult - Complete (No Data)  SW Recovery Care/Counseling Consult - - Complete  Palliative Care Screening Not Applicable Complete Not Applicable  Skilled Nursing Facility Not Applicable Complete Complete  Some recent data might be hidden

## 2020-07-10 NOTE — TOC Progression Note (Signed)
Transition of Care [facility]) - Progression Note    Patient Details  Name: Brett [name] MRN: [id number] Date of Birth: 1934-12-06  Transition of Care [facility]) CM/SW [place], RN Phone Number: 07/10/2020, 3:32 PM  Clinical Narrative:     Patient has progressed well with therapy and is requesting Home Health services instead of SNF services.  Caregiver is in agreement.  Home Health has been set up with [facility] for PT and OT services after discharge.    Expected Discharge Plan: [place] Barriers to Discharge: Continued Medical Work up, Ship broker  Expected Discharge Plan and Services Expected Discharge Plan: [place] In-house Referral: Clinical Social Work   Post Acute Care Choice: [place] Living arrangements for the past 2 months: [place] Determinants of Health (SDOH) Interventions    Readmission Risk Interventions Readmission Risk Prevention Plan 07/05/2020 04/09/2020 12/05/2019  Transportation Screening - Complete -  Medication Review Press photographer) - Referral to Pharmacy -  PCP or Specialist appointment within 3-5 days of discharge - Complete Complete  HRI or Home Care Consult - Complete (No Data)  SW Recovery Care/Counseling Consult - - Complete  Palliative Care Screening Not Applicable Complete Not Applicable  Skilled Nursing Facility Not Applicable Complete Complete  Some recent data might be hidden

## 2020-07-10 NOTE — Progress Notes (Signed)
[facility]  ROUNDING NOTE   Subjective:   Patient is hard of hearing. He states he does not want to live in a nursing home.   Patient's roommate at bedside.   Objective:  Vital signs in last 24 hours:  Temp:  [97.9 F (36.6 C)-98.4 F (36.9 C)] 98.4 F (36.9 C) (08/12 1112) Pulse Rate:  [70-77] 74 (08/12 1112) Resp:  [16-20] 16 (08/12 1112) BP: (97-115)/(49-54) 97/51 (08/12 1112) SpO2:  [90 %-100 %] 98 % (08/12 1112) Weight:  [78.6 kg] 78.6 kg (08/12 0421)  Weight change:  Filed Weights   07/07/20 0500 07/08/20 0616 07/10/20 0421  Weight: 82.3 kg 78.8 kg 78.6 kg    Intake/Output: I/O last 3 completed shifts: In: 70 [P.O.:960; I.V.:3] Out: 1800 [Urine:1800]   Intake/Output this shift:  Total I/O In: 240 [P.O.:240] Out: 201 [Urine:200; Stool:1]  Physical Exam: General: NAD, laying in bed  Head: Normocephalic, atraumatic. Moist oral mucosal membranes  Eyes: Anicteric, PERRL  Neck: Supple, trachea midline  Lungs:  clear  Heart: Regular rate and rhythm  Abdomen:  Soft, nontender,   Extremities:  no peripheral edema.  Neurologic: Moving all four extremities. +Hard of hearing  Skin: No lesions        Basic Metabolic Panel: Recent Labs  Lab 07/06/20 0705 07/06/20 0705 07/07/20 0611 07/07/20 0611 07/08/20 0459 07/09/20 0626 07/10/20 0637  NA 141  --  141  --  140 142 139  K 3.8  --  4.0  --  4.0 4.0 4.1  CL 97*  --  94*  --  94* 95* 93*  CO2 32  --  34*  --  33* 35* 33*  GLUCOSE 95  --  103*  --  123* 129* 136*  BUN 61*  --  67*  --  71* 71* 83*  CREATININE 2.88*  --  2.96*  --  2.66* 2.70* 3.26*  CALCIUM 7.5*   < > 7.7*   < > 8.1* 8.4* 8.5*   < > = values in this interval not displayed.    Liver Function Tests: Recent Labs  Lab 07/04/20 0912  AST 17  ALT 13  ALKPHOS 76  BILITOT 0.8  PROT 7.6  ALBUMIN 3.4*   No results for input(s): LIPASE, AMYLASE in the last 168 hours. No results for input(s): AMMONIA in the last 168  hours.  CBC: Recent Labs  Lab 07/04/20 0912 07/04/20 0912 07/05/20 0438 07/06/20 0705 07/07/20 0611 07/08/20 0459 07/10/20 0637  WBC 11.7*  --  8.5 8.3 7.0  --  8.4  NEUTROABS 9.7*  --   --   --   --   --   --   HGB 7.3*   < > 6.4* 7.3* 8.3* 8.8* 8.8*  HCT 24.3*  --  20.8* 23.9* 26.4*  --  28.6*  MCV 92.7  --  90.4 90.9 88.6  --  91.4  PLT 119*  --  100* 127* 138*  --  144*   < > = values in this interval not displayed.    Cardiac Enzymes: No results for input(s): CKTOTAL, CKMB, CKMBINDEX, TROPONINI in the last 168 hours.  BNP: Invalid input(s): POCBNP  CBG: Recent Labs  Lab 07/07/20 2103 07/08/20 0813 07/08/20 1121 07/08/20 1648 07/08/20 2121  GLUCAP 196* 113* 174* 146* 185*    Microbiology: Results for orders placed or performed during the hospital encounter of 07/04/20  SARS Coronavirus 2 by RT PCR (hospital order, performed in [facility] hospital lab)  Nasopharyngeal Nasopharyngeal Swab     Status: None   Collection Time: 07/04/20  9:13 AM   Specimen: Nasopharyngeal Swab  Result Value Ref Range Status   SARS Coronavirus 2 NEGATIVE NEGATIVE Final    Comment: (NOTE) SARS-CoV-2 target nucleic acids are NOT DETECTED.  The SARS-CoV-2 RNA is generally detectable in upper and lower respiratory specimens during the acute phase of infection. The lowest concentration of SARS-CoV-2 viral copies this assay can detect is 250 copies / mL. A negative result does not preclude SARS-CoV-2 infection and should not be used as the sole basis for treatment or other patient management decisions.  A negative result may occur with improper specimen collection / handling, submission of specimen other than nasopharyngeal swab, presence of viral mutation(s) within the areas targeted by this assay, and inadequate number of viral copies (<250 copies / mL). A negative result must be combined with clinical observations, patient history, and epidemiological information.  Fact Sheet for  Patients:   StrictlyIdeas.no  Fact Sheet for Healthcare Providers: BankingDealers.co.za  This test is not yet approved or  cleared by the [place] FDA and has been authorized for detection and/or diagnosis of SARS-CoV-2 by FDA under an Emergency Use Authorization (EUA).  This EUA will remain in effect (meaning this test can be used) for the duration of the COVID-19 declaration under Section 564(b)(1) of the Act, 21 U.S.C. section 360bbb-3(b)(1), unless the authorization is terminated or revoked sooner.  Performed at [facility], [address] [place], [place] [address]     Coagulation Studies: No results for input(s): LABPROT, INR in the last 72 hours.  Urinalysis: No results for input(s): COLORURINE, LABSPEC, PHURINE, GLUCOSEU, HGBUR, BILIRUBINUR, KETONESUR, PROTEINUR, UROBILINOGEN, NITRITE, LEUKOCYTESUR in the last 72 hours.  Invalid input(s): APPERANCEUR    Imaging: No results found.   Medications:   . sodium chloride     . amLODipine  5 mg Oral Daily  . carbamide peroxide  5 drop Both EARS BID  . ferrous sulfate  325 mg Oral BID WC  . gabapentin  100 mg Oral BID  . isosorbide mononitrate  30 mg Oral Daily  . lactulose  30 g Oral BID  . metoprolol  200 mg Oral Daily  . mometasone-formoterol  2 puff Inhalation BID  . pantoprazole  40 mg Oral BID  . polyethylene glycol  17 g Oral Daily  . rosuvastatin  40 mg Oral Daily  . sodium chloride flush  3 mL Intravenous Q12H  . tamsulosin  0.4 mg Oral QPC supper  . tiotropium  18 mcg Inhalation Daily  . torsemide  20 mg Oral Daily  . vitamin B-12  1,000 mcg Oral Daily  . Vitamin D (Ergocalciferol)  50,000 Units Oral Weekly   sodium chloride, acetaminophen, ipratropium-albuterol, ondansetron (ZOFRAN) IV, oxyCODONE, sodium chloride flush  Assessment/ Plan:  [name] is a 85 y.o. white male with B-cell lymphoma, aortic regurgitation, coronary  artery disease, congestive heart failure, diabetes mellitus type II, COPD, hypertension, peptic ulcer disease, peripheral vascular disease and hearing loss who was admitted to [facility] on 07/04/2020 for Acute respiratory failure with hypoxia ([place]) [N39.76] Acute diastolic CHF (congestive heart failure) ([place]) [I50.31] Acute on chronic congestive heart failure, unspecified heart failure type ([place]) [I50.9]  1. Acute renal failure on chronic kidney disease stage IIIB with proteinuria: baseline creatinine of 1.79, GFR of 35 on 12/05/19. Chronic Kidney disease secondary to diabetes and hypertension Acute renal failure secondary to acute cardiorenal syndrome.  No acute  indication for dialysis.  Not on an ACE-I/ARB at home.   2. Hypertension with acute exacerbation of diastolic congestive heart failure. Now with metabolic alkalosis.  - Continue home regimen of amlodipine, isosorbide mononitrate, metoprolol and tamsulosin.  - Decrease torsemide to 10mg  daily.   3. Anemia with chronic kidney disease: with iron deficiency: normocytic. Status post PRBC transfusion.  Not currently a candidate for ESA with malignancy.  - continue PO iron supplements.   4. Diabetes mellitus type II with chronic kidney disease: insulin dependent. Hemoglobin A1c of 5.2%.    LOS: 6 Brett [name] 8/12/202112:18 PM

## 2020-07-11 LAB — BASIC METABOLIC PANEL
Anion gap: 12 (ref 5–15)
BUN: 85 mg/dL — ABNORMAL HIGH (ref 8–23)
CO2: 31 mmol/L (ref 22–32)
Calcium: 8.4 mg/dL — ABNORMAL LOW (ref 8.9–10.3)
Chloride: 97 mmol/L — ABNORMAL LOW (ref 98–111)
Creatinine, Ser: 3.24 mg/dL — ABNORMAL HIGH (ref 0.61–1.24)
GFR calc Af Amer: 19 mL/min — ABNORMAL LOW (ref >60)
GFR calc non Af Amer: 17 mL/min — ABNORMAL LOW (ref >60)
Glucose, Bld: 151 mg/dL — ABNORMAL HIGH (ref 70–99)
Potassium: 4.1 mmol/L (ref 3.5–5.1)
Sodium: 140 mmol/L (ref 135–145)

## 2020-07-11 LAB — CBC
HCT: 28.2 % — ABNORMAL LOW (ref 39.0–52.0)
Hemoglobin: 8.8 g/dL — ABNORMAL LOW (ref 13.0–17.0)
MCH: 28.1 pg (ref 26.0–34.0)
MCHC: 31.2 g/dL (ref 30.0–36.0)
MCV: 90.1 fL (ref 80.0–100.0)
Platelets: 135 10*3/uL — ABNORMAL LOW (ref 150–400)
RBC: 3.13 MIL/uL — ABNORMAL LOW (ref 4.22–5.81)
RDW: 15.3 % (ref 11.5–15.5)
WBC: 8.5 10*3/uL (ref 4.0–10.5)
nRBC: 0 % (ref 0.0–0.2)

## 2020-07-11 MED ORDER — TORSEMIDE 20 MG PO TABS
10.0000 mg | ORAL_TABLET | Freq: Every day | ORAL | Status: DC
  Administered 2020-07-11: 10 mg via ORAL
  Filled 2020-07-11: qty 1

## 2020-07-11 MED ORDER — TORSEMIDE 10 MG PO TABS: 10.0000 mg | ORAL_TABLET | Freq: Every day | ORAL | 0 refills | Status: AC

## 2020-07-11 NOTE — TOC Transition Note (Signed)
Transition of Care [facility]) - Progression Note    Patient Details  Name: Brett [name] MRN: [id number] Date of Birth: 1935-06-04  Transition of Care [facility]) CM/SW [place], RN Phone Number: 07/11/2020, 3:29 PM  Clinical Narrative:     Patient has progressed well with therapy and is requesting Home Health services instead of SNF services.  Caregiver is in agreement.  Home Health has been set up with [facility] for PT and OT services after discharge.     Patient has home oxygen through [place].  Patient to be transported home [facility].    Expected Discharge Plan: [place] Barriers to Discharge: Continued Medical Work up  Expected Discharge Plan and Services Expected Discharge Plan: [place] In-house Referral: Clinical Social Work   Post Acute Care Choice: [place] Living arrangements for the past 2 months: [place] Expected Discharge Date: 07/11/20                         HH Arranged: PT, OT [place] Agency: [facility] [employer] Date [place]: 07/10/20 Time [place]: [place] Representative spoke with at [place]: [place] ([place]) Interventions    Readmission Risk Interventions Readmission Risk Prevention Plan 07/05/2020 04/09/2020 12/05/2019  Transportation Screening - Complete -  Medication Review Press photographer) - Referral to Pharmacy -  PCP or Specialist appointment within 3-5 days of discharge - Complete Complete  HRI or [place] - Complete (No Data)  SW Recovery Care/Counseling Consult - - Complete  Palliative Care Screening Not Applicable Complete Not Applicable  Skilled Nursing Facility Not Applicable Complete Complete  Some recent data might be hidden

## 2020-07-11 NOTE — Progress Notes (Signed)
Called and discussed discharge instructions with POA- Claudette Head listed in chart.   Discussed medications, follow up appointments and monitoring weight/ for fluid volume overload.

## 2020-07-11 NOTE — Progress Notes (Signed)
Mobility Specialist - Progress Note   07/11/20 1218  Mobility  Activity Transferred:  Bed to chair  Level of Assistance Minimal assist, patient does 75% or more  Assistive Device Front wheel walker  Distance Ambulated (ft) 3 ft  Mobility Response Tolerated well  Mobility performed by Mobility specialist  $Mobility charge 1 Mobility    Pre-mobility: 76 HR, 108/56 BP, 98% SpO2 Post-mobility: 83 HR, 115/49 BP, 96% SpO2   Per discussion w/ nurse, monitor pt's O2 while transferring to chair w/o utilizing 2L O2 [place]. Pt was in bed upon arrival. Pt agreed to transfer to recliner. Pt utilizing 2L O2 [place] at rest. O2 was removed during transition of supine to sit. O2 desat no less than 84%. 2L O2 [place] was given to pt to increase O2 before transfer. O2 immediately sat up > 94%. O2 was taken off right before transfer. After transferring to recliner, O2 desat to 88%. In conclusion, pt could not maintain O2 >90% w/o 2L O2 [place]. Overall, pt tolerated session well. Pt left in recliner utilizing 2L O2 [place], w/ O2 sitting at 96%. Chair alarm set, call bell and phone placed in reach. Nurse was notified.    Daleisa Halperin Mobility Specialist  07/11/20, 12:28 PM

## 2020-07-11 NOTE — Progress Notes (Signed)
Per MD request- tried to wean patient down to room air.   Unable to wean - Patient's O2 sats dropped while on RA to 85% at rest.  Attempted to do on twice.

## 2020-07-11 NOTE — Discharge Summary (Signed)
Physician Discharge Summary  Brett [name] VQQ:[id number] DOB: 17-Jul-1935 DOA: 07/04/2020  PCP: Perrin Maltese, MD  Admit date: 07/04/2020 Discharge date: 07/11/2020  Admitted From: home  Disposition:  Home w/ home health   Recommendations for Outpatient Follow-up:  1. Follow up with PCP in 1-2 weeks 2. F/u nephro, Dr. Juleen China, in 2-4 weeks  Home Health: yes Equipment/Devices: chronic oxygen   Discharge Condition: stable  CODE STATUS: DNR  Diet recommendation: Heart Healthy / Carb Modified  Brief/Interim Summary: HPI was taken from Dr. Francine Graven:  Brett [name] is a 85 y.o. male with medical history significant for chronic diastolic dysfunction CHF, coronary artery disease, hypertension, diabetes mellitus with complications of stage IV chronic kidney disease who presents from home via EMS for evaluation of shortness of breath.  Patient noted to be in respiratory distress with labored breathing and on room air at rest had pulse oximetry of 60%.  He was placed on 6 L of oxygen by nasal cannula without any improvement and was eventually transitioned to a nonrebreather mask. He has a wet cough and has bilateral lower extremity swelling.  He denies having any chest pain, diaphoresis or palpitations.  He denies having any fever or chills but complains of pain in his lower abdomen.  Denies having any frequency, nocturia or dysuria. Labs show a BUN of 34 creatinine of 2.5 consistent with patient's known history of chronic kidney disease, BNP of 816, hemoglobin of 7.3 and white count of 11.7 with predominant neutrophils. Chest x-ray reviewed by me shows bilateral pleural effusions. Twelve-lead EKG reviewed by me shows sinus rhythm   ED Course: Patient is an 85 year old Caucasian male who presents to the emergency room via EMS for evaluation of shortness of breath and found to be hypoxic requiring noninvasive mechanical ventilation to reduce work of breathing and improve oxygenation.  Chest  x-ray is consistent with congestive heart failure.  He will be admitted to the hospital for further evaluation.  Hospital Course from Dr. Lenise Herald 8/11-8/13/21: Pt presented w/ shortness of breath secondary to acute CHF exacerbation. Pt was treated w/ IV diuresis and then transitioned to po torsemide. Furthermore, pt was found to have IDA of unknown etiology and was give 1 unit of pRBCs & IV iron prior to me seeing the pt. Pt did not require anymore transfusions but did continue on po iron. Pt does take plavix daily but this was held inpatient. PT/OT saw the pt and recommended SNF initially but then on repeat eval therapy recommended home health. Home health was set up by CM prior to d/c. For more information, please see previous progress notes.   Discharge Diagnoses:  Principal Problem:   Acute on chronic diastolic CHF (congestive heart failure) (HCC) Active Problems:   COPD (chronic obstructive pulmonary disease) (HCC)   Essential hypertension   Acute respiratory failure with hypoxia (HCC)   Type 2 diabetes mellitus with stage 4 chronic kidney disease (HCC)   History of anemia due to CKD   AF (paroxysmal atrial fibrillation) (HCC)   Bilateral impacted cerumen   Epigastric abdominal pain  Epigastric abdominal pain: continue on protonix. X-ray flat and upright showed stool in the colon. Continue on lactulose.  Acute on chronic hypoxic respiratory failure: initially the patient required BiPAP on admission but it has since been weaned off. Continue on supplemental oxygen, currently on 2L [place]. Encourage incentive spirometry   Acute diastolic congestive heart failure: continue on torsemide. Monitor I/Os.   Iron deficiency anemia: s/p 1 unit  of pRBCs & IV iron. Continue on po iron supplements. Guaiac positive and continue to hold plavix. H&H are stable   PAF: continue on metoprolol. Likely not on anticoagulation secondary to high fall risk   DM2: last HbA1c 5.2. Continue on SSI w/  accuchecks.   CKDIIIb: as per nephro. Cr is labile. Baseline Cr is 1.79.  Will continue to monitor   Thrombocytopenia: etiology unclear. Trending up today. Will continue to monitor  History of CAD and pulmonary hypertension: continue to hold plavix w/ guaiac positive requiring transfusion. Continue metoprolol  HTN: continue on amlodipine and metoprolol  Cerumen impaction: continue on debrox otic solution. Refer to ENT as outpatient  Weakness: PT recs SNF initially, repeat eval recs home health   Discharge Instructions  Discharge Instructions    Diet - low sodium heart healthy   Complete by: As directed    Diet Carb Modified   Complete by: As directed    Discharge instructions   Complete by: As directed    F/u PCP in 1-2 weeks. F/u nephro, Dr. Juleen China, in 2-4 weeks   Increase activity slowly   Complete by: As directed      Allergies as of 07/11/2020   No Known Allergies     Medication List    STOP taking these medications   furosemide 20 MG tablet Commonly known as: LASIX     TAKE these medications   albuterol 108 (90 Base) MCG/ACT inhaler Commonly known as: VENTOLIN HFA Inhale 2 puffs into the lungs every 6 (six) hours as needed for wheezing or shortness of breath.   amLODipine 5 MG tablet Commonly known as: NORVASC Take 5 mg by mouth daily.   clopidogrel 75 MG tablet Commonly known as: PLAVIX Take 75 mg by mouth daily.   formoterol 20 MCG/2ML nebulizer solution Commonly known as: PERFOROMIST Use as directed   gabapentin 100 MG capsule Commonly known as: NEURONTIN Take 1 capsule by mouth 2 (two) times daily.   glimepiride 2 MG tablet Commonly known as: AMARYL Take 1 tablet (2 mg total) by mouth daily with breakfast. What changed:   how much to take  when to take this   hydrALAZINE 100 MG tablet Commonly known as: APRESOLINE Take 100 mg by mouth daily.   ipratropium-albuterol 0.5-2.5 (3) MG/3ML Soln Commonly known as: DUONEB J44.1  28ml  nebulizer every six hours as needed for shortness of breath What changed:   how much to take  how to take this  when to take this  reasons to take this  additional instructions   isosorbide mononitrate 30 MG 24 hr tablet Commonly known as: IMDUR Take 30 mg by mouth daily.   metoprolol 200 MG 24 hr tablet Commonly known as: TOPROL-XL Take 200 mg by mouth daily.   mometasone-formoterol 100-5 MCG/ACT Aero Commonly known as: DULERA Inhale 2 puffs into the lungs 2 (two) times daily.   pantoprazole 40 MG tablet Commonly known as: PROTONIX Take 40 mg by mouth daily.   polyethylene glycol 17 g packet Commonly known as: MIRALAX / GLYCOLAX Take 17 g by mouth daily as needed.   rosuvastatin 40 MG tablet Commonly known as: CRESTOR Take 40 mg by mouth daily.   tiotropium 18 MCG inhalation capsule Commonly known as: Spiriva HandiHaler Place 1 capsule (18 mcg total) into inhaler and inhale daily.   torsemide 10 MG tablet Commonly known as: DEMADEX Take 1 tablet (10 mg total) by mouth daily. Start taking on: July 12, 2020   Vitamin D (  Ergocalciferol) 1.25 MG (50000 UNIT) Caps capsule Commonly known as: DRISDOL Take 50,000 Units by mouth once a week.   Yupelri 175 MCG/3ML nebulizer solution Generic drug: revefenacin Inhale 175 mcg into the lungs daily.            Durable Medical Equipment  (From admission, onward)         Start     Ordered   07/11/20 1220  For home use only DME oxygen  Once       Question Answer Comment  Length of Need 12 Months   Liters per Minute 2   Frequency Continuous (stationary and portable oxygen unit needed)   Oxygen conserving device Yes   Oxygen delivery system Gas      07/11/20 1219          Contact information for follow-up providers    [facility] Follow up on 07/21/2020.   Specialty: Cardiology Why: at 3:00pm. Enter through the [place] entrance Contact information: [place] [address] [place] [phone]       Lavonia Dana, MD Follow up in 3 week(s).   Specialty: Nephrology Contact information: [address] [place] [address] [phone]            Contact information for after-discharge care    Destination    [facility] Preferred SNF .   Service: Skilled Nursing Contact information: [address] [place] [phone]                 No Known Allergies  Consultations:  Nephro, Dr. Juleen China   Procedures/Studies: DG Chest Portable 1 View  Result Date: 07/04/2020 CLINICAL DATA:  Shortness of breath EXAM: PORTABLE CHEST 1 VIEW COMPARISON:  04/13/2020 FINDINGS: Cardiac shadow remains enlarged. Increased central vascular congestion is noted with small effusions bilaterally. Mild parenchymal edema is noted as well. IMPRESSION: Increasing CHF. Electronically Signed   By: Inez Catalina M.D.   On: 07/04/2020 09:27   DG Abd Portable 2V  Result Date: 07/08/2020 CLINICAL DATA:  Epigastric pain.  Hypertension EXAM: PORTABLE ABDOMEN - 2 VIEW COMPARISON:  November 30, 2019. FINDINGS: Supine and upright images obtained. There is moderate stool throughout colon. There is no bowel dilatation or air-fluid to suggest bowel obstruction. No free air. There are occasional vascular calcifications in the pelvis. Lung bases are clear. IMPRESSION: Moderate stool in colon.  No bowel obstruction or free air. Electronically Signed   By: Lowella Grip III M.D.   On: 07/08/2020 11:25   (Echo, Carotid, EGD, Colonoscopy, ERCP)    Subjective: Pt c/o fatigue    Discharge Exam: Vitals:   07/11/20 1200 07/11/20 1345  BP: (!) 115/49 (!) 102/53  Pulse: 93 77  Resp: (!) 31 (!) 22  Temp:  97.9 F (36.6 C)  SpO2: (!) 88% 96%   Vitals:   07/11/20 0735 07/11/20 1123 07/11/20 1200 07/11/20 1345  BP: (!) 107/52 (!) 105/59 (!) 115/49 (!) 102/53  Pulse: 77  80 93 77  Resp: 17 16 (!) 31 (!) 22  Temp: (!) 97.5 F (36.4 C) 97.8 F (36.6 C)  97.9 F (36.6 C)  TempSrc: Oral Oral  Oral  SpO2: 99% 90% (!) 88% 96%  Weight:      Height:        General: Pt is alert, awake, not in acute distress. Frail appearing  Cardiovascular:S1/S2 +, no rubs, no gallops Respiratory: decreased  breath sounds b/l. No wheezes Abdominal: Soft, NT, ND, bowel sounds + Extremities:no cyanosis    The results of significant diagnostics from this hospitalization (including imaging, microbiology, ancillary and laboratory) are listed below for reference.     Microbiology: Recent Results (from the past 240 hour(s))  SARS Coronavirus 2 by RT PCR (hospital order, performed in [facility] hospital lab) Nasopharyngeal Nasopharyngeal Swab     Status: None   Collection Time: 07/04/20  9:13 AM   Specimen: Nasopharyngeal Swab  Result Value Ref Range Status   SARS Coronavirus 2 NEGATIVE NEGATIVE Final    Comment: (NOTE) SARS-CoV-2 target nucleic acids are NOT DETECTED.  The SARS-CoV-2 RNA is generally detectable in upper and lower respiratory specimens during the acute phase of infection. The lowest concentration of SARS-CoV-2 viral copies this assay can detect is 250 copies / mL. A negative result does not preclude SARS-CoV-2 infection and should not be used as the sole basis for treatment or other patient management decisions.  A negative result may occur with improper specimen collection / handling, submission of specimen other than nasopharyngeal swab, presence of viral mutation(s) within the areas targeted by this assay, and inadequate number of viral copies (<250 copies / mL). A negative result must be combined with clinical observations, patient history, and epidemiological information.  Fact Sheet for Patients:   StrictlyIdeas.no  Fact Sheet for Healthcare Providers: BankingDealers.co.za  This test is not yet  approved or  cleared by the [place] FDA and has been authorized for detection and/or diagnosis of SARS-CoV-2 by FDA under an Emergency Use Authorization (EUA).  This EUA will remain in effect (meaning this test can be used) for the duration of the COVID-19 declaration under Section 564(b)(1) of the Act, 21 U.S.C. section 360bbb-3(b)(1), unless the authorization is terminated or revoked sooner.  Performed at [facility], [address] [place], [place] [address]      Labs: BNP (last 3 results) Recent Labs    04/04/20 0446 04/10/20 0418 07/04/20 0912  BNP 591.0* 301.0* 660.6*   Basic Metabolic Panel: Recent Labs  Lab 07/07/20 0611 07/08/20 0459 07/09/20 0626 07/10/20 0637 07/11/20 0447  NA 141 140 142 139 140  K 4.0 4.0 4.0 4.1 4.1  CL 94* 94* 95* 93* 97*  CO2 34* 33* 35* 33* 31  GLUCOSE 103* 123* 129* 136* 151*  BUN 67* 71* 71* 83* 85*  CREATININE 2.96* 2.66* 2.70* 3.26* 3.24*  CALCIUM 7.7* 8.1* 8.4* 8.5* 8.4*   Liver Function Tests: No results for input(s): AST, ALT, ALKPHOS, BILITOT, PROT, ALBUMIN in the last 168 hours. No results for input(s): LIPASE, AMYLASE in the last 168 hours. No results for input(s): AMMONIA in the last 168 hours. CBC: Recent Labs  Lab 07/05/20 0438 07/05/20 0438 07/06/20 0705 07/07/20 0611 07/08/20 0459 07/10/20 0637 07/11/20 0447  WBC 8.5  --  8.3 7.0  --  8.4 8.5  HGB 6.4*   < > 7.3* 8.3* 8.8* 8.8* 8.8*  HCT 20.8*  --  23.9* 26.4*  --  28.6* 28.2*  MCV 90.4  --  90.9 88.6  --  91.4 90.1  PLT 100*  --  127* 138*  --  144* 135*   < > = values in this interval not displayed.   Cardiac Enzymes: No results for input(s): CKTOTAL, CKMB, CKMBINDEX, TROPONINI in the last 168 hours. BNP: Invalid input(s): POCBNP CBG: Recent Labs  Lab 07/07/20 2103 07/08/20 0813 07/08/20 1121 07/08/20 1648 07/08/20 2121  GLUCAP 196* 113*  174* 146* 185*   D-Dimer No results for input(s): DDIMER in the last 72 hours. Hgb  A1c No results for input(s): HGBA1C in the last 72 hours. Lipid Profile No results for input(s): CHOL, HDL, LDLCALC, TRIG, CHOLHDL, LDLDIRECT in the last 72 hours. Thyroid function studies No results for input(s): TSH, T4TOTAL, T3FREE, THYROIDAB in the last 72 hours.  Invalid input(s): FREET3 Anemia work up No results for input(s): VITAMINB12, FOLATE, FERRITIN, TIBC, IRON, RETICCTPCT in the last 72 hours. Urinalysis    Component Value Date/Time   COLORURINE YELLOW (A) 04/04/2020 0628   APPEARANCEUR CLEAR (A) 04/04/2020 0628   LABSPEC 1.011 04/04/2020 0628   PHURINE 6.0 04/04/2020 0628   GLUCOSEU 50 (A) 04/04/2020 0628   HGBUR NEGATIVE 04/04/2020 0628   BILIRUBINUR NEGATIVE 04/04/2020 0628   KETONESUR NEGATIVE 04/04/2020 0628   PROTEINUR 100 (A) 04/04/2020 0628   NITRITE NEGATIVE 04/04/2020 0628   LEUKOCYTESUR NEGATIVE 04/04/2020 0628   Sepsis Labs Invalid input(s): PROCALCITONIN,  WBC,  LACTICIDVEN Microbiology Recent Results (from the past 240 hour(s))  SARS Coronavirus 2 by RT PCR (hospital order, performed in [facility] hospital lab) Nasopharyngeal Nasopharyngeal Swab     Status: None   Collection Time: 07/04/20  9:13 AM   Specimen: Nasopharyngeal Swab  Result Value Ref Range Status   SARS Coronavirus 2 NEGATIVE NEGATIVE Final    Comment: (NOTE) SARS-CoV-2 target nucleic acids are NOT DETECTED.  The SARS-CoV-2 RNA is generally detectable in upper and lower respiratory specimens during the acute phase of infection. The lowest concentration of SARS-CoV-2 viral copies this assay can detect is 250 copies / mL. A negative result does not preclude SARS-CoV-2 infection and should not be used as the sole basis for treatment or other patient management decisions.  A negative result may occur with improper specimen collection / handling, submission of specimen other than nasopharyngeal swab, presence of viral mutation(s) within the areas targeted by this assay, and inadequate  number of viral copies (<250 copies / mL). A negative result must be combined with clinical observations, patient history, and epidemiological information.  Fact Sheet for Patients:   StrictlyIdeas.no  Fact Sheet for Healthcare Providers: BankingDealers.co.za  This test is not yet approved or  cleared by the [place] FDA and has been authorized for detection and/or diagnosis of SARS-CoV-2 by FDA under an Emergency Use Authorization (EUA).  This EUA will remain in effect (meaning this test can be used) for the duration of the COVID-19 declaration under Section 564(b)(1) of the Act, 21 U.S.C. section 360bbb-3(b)(1), unless the authorization is terminated or revoked sooner.  Performed at [facility], [address], [place], [place] [address]      Time coordinating discharge: Over 30 minutes  SIGNED:   Wyvonnia Dusky, MD  [facility] 07/11/2020, 1:56 PM Pager   If 7PM-7AM, please contact night-coverage [email or web address]

## 2020-07-11 NOTE — Care Management Important Message (Signed)
Important Message  Patient Details  Name: Brett [name] MRN: [id number] Date of Birth: 01-11-1935   Medicare Important Message Given:  Yes     Dannette Barbara 07/11/2020, 11:12 AM

## 2020-07-11 NOTE — Progress Notes (Signed)
[facility]  ROUNDING NOTE   Subjective:   Patient is hard of hearing. He denies any complaints.  Creatinine 3.24 (3.26) CO2 31 (33)  Objective:  Vital signs in last 24 hours:  Temp:  [97 F (36.1 C)-97.6 F (36.4 C)] 97.5 F (36.4 C) (08/13 0735) Pulse Rate:  [71-77] 77 (08/13 0735) Resp:  [17-23] 17 (08/13 0735) BP: (102-119)/(52-55) 107/52 (08/13 0735) SpO2:  [97 %-99 %] 99 % (08/13 0735)  Weight change:  Filed Weights   07/07/20 0500 07/08/20 0616 07/10/20 0421  Weight: 82.3 kg 78.8 kg 78.6 kg    Intake/Output: I/O last 3 completed shifts: In: 840 [P.O.:840] Out: 602 [Urine:600; Stool:2]   Intake/Output this shift:  No intake/output data recorded.  Physical Exam: General: NAD, laying in bed  Head: Normocephalic, atraumatic. Moist oral mucosal membranes  Eyes: Anicteric, PERRL  Neck: Supple, trachea midline  Lungs:  clear  Heart: Regular rate and rhythm  Abdomen:  Soft, nontender,   Extremities:  no peripheral edema.  Neurologic: Moving all four extremities. +Hard of hearing  Skin: No lesions        Basic Metabolic Panel: Recent Labs  Lab 07/07/20 0611 07/07/20 0611 07/08/20 0459 07/08/20 0459 07/09/20 0626 07/10/20 0637 07/11/20 0447  NA 141  --  140  --  142 139 140  K 4.0  --  4.0  --  4.0 4.1 4.1  CL 94*  --  94*  --  95* 93* 97*  CO2 34*  --  33*  --  35* 33* 31  GLUCOSE 103*  --  123*  --  129* 136* 151*  BUN 67*  --  71*  --  71* 83* 85*  CREATININE 2.96*  --  2.66*  --  2.70* 3.26* 3.24*  CALCIUM 7.7*   < > 8.1*   < > 8.4* 8.5* 8.4*   < > = values in this interval not displayed.    Liver Function Tests: No results for input(s): AST, ALT, ALKPHOS, BILITOT, PROT, ALBUMIN in the last 168 hours. No results for input(s): LIPASE, AMYLASE in the last 168 hours. No results for input(s): AMMONIA in the last 168 hours.  CBC: Recent Labs  Lab 07/05/20 0438 07/05/20 0438 07/06/20 0705 07/07/20 0611 07/08/20 0459  07/10/20 0637 07/11/20 0447  WBC 8.5  --  8.3 7.0  --  8.4 8.5  HGB 6.4*   < > 7.3* 8.3* 8.8* 8.8* 8.8*  HCT 20.8*  --  23.9* 26.4*  --  28.6* 28.2*  MCV 90.4  --  90.9 88.6  --  91.4 90.1  PLT 100*  --  127* 138*  --  144* 135*   < > = values in this interval not displayed.    Cardiac Enzymes: No results for input(s): CKTOTAL, CKMB, CKMBINDEX, TROPONINI in the last 168 hours.  BNP: Invalid input(s): POCBNP  CBG: Recent Labs  Lab 07/07/20 2103 07/08/20 0813 07/08/20 1121 07/08/20 1648 07/08/20 2121  GLUCAP 196* 113* 174* 146* 185*    Microbiology: Results for orders placed or performed during the hospital encounter of 07/04/20  SARS Coronavirus 2 by RT PCR (hospital order, performed in [facility] hospital lab) Nasopharyngeal Nasopharyngeal Swab     Status: None   Collection Time: 07/04/20  9:13 AM   Specimen: Nasopharyngeal Swab  Result Value Ref Range Status   SARS Coronavirus 2 NEGATIVE NEGATIVE Final    Comment: (NOTE) SARS-CoV-2 target nucleic acids are NOT DETECTED.  The SARS-CoV-2 RNA is  generally detectable in upper and lower respiratory specimens during the acute phase of infection. The lowest concentration of SARS-CoV-2 viral copies this assay can detect is 250 copies / mL. A negative result does not preclude SARS-CoV-2 infection and should not be used as the sole basis for treatment or other patient management decisions.  A negative result may occur with improper specimen collection / handling, submission of specimen other than nasopharyngeal swab, presence of viral mutation(s) within the areas targeted by this assay, and inadequate number of viral copies (<250 copies / mL). A negative result must be combined with clinical observations, patient history, and epidemiological information.  Fact Sheet for Patients:   StrictlyIdeas.no  Fact Sheet for Healthcare Providers: BankingDealers.co.za  This test is not  yet approved or  cleared by the [place] FDA and has been authorized for detection and/or diagnosis of SARS-CoV-2 by FDA under an Emergency Use Authorization (EUA).  This EUA will remain in effect (meaning this test can be used) for the duration of the COVID-19 declaration under Section 564(b)(1) of the Act, 21 U.S.C. section 360bbb-3(b)(1), unless the authorization is terminated or revoked sooner.  Performed at [facility], [address] [place], [place] [address]     Coagulation Studies: No results for input(s): LABPROT, INR in the last 72 hours.  Urinalysis: No results for input(s): COLORURINE, LABSPEC, PHURINE, GLUCOSEU, HGBUR, BILIRUBINUR, KETONESUR, PROTEINUR, UROBILINOGEN, NITRITE, LEUKOCYTESUR in the last 72 hours.  Invalid input(s): APPERANCEUR    Imaging: No results found.   Medications:   . sodium chloride     . amLODipine  5 mg Oral Daily  . carbamide peroxide  5 drop Both EARS BID  . ferrous sulfate  325 mg Oral BID WC  . gabapentin  100 mg Oral BID  . isosorbide mononitrate  30 mg Oral Daily  . lactulose  30 g Oral BID  . metoprolol  200 mg Oral Daily  . mometasone-formoterol  2 puff Inhalation BID  . pantoprazole  40 mg Oral BID  . polyethylene glycol  17 g Oral Daily  . rosuvastatin  40 mg Oral Daily  . sodium chloride flush  3 mL Intravenous Q12H  . tamsulosin  0.4 mg Oral QPC supper  . tiotropium  18 mcg Inhalation Daily  . torsemide  10 mg Oral Daily  . vitamin B-12  1,000 mcg Oral Daily  . Vitamin D (Ergocalciferol)  50,000 Units Oral Weekly   sodium chloride, acetaminophen, ipratropium-albuterol, ondansetron (ZOFRAN) IV, oxyCODONE, sodium chloride flush  Assessment/ Plan:  Brett [name] is a 85 y.o. white male with B-cell lymphoma, aortic regurgitation, coronary artery disease, congestive heart failure, diabetes mellitus type II, COPD, hypertension, peptic ulcer disease, peripheral vascular disease and hearing loss who  was admitted to [facility] 07/04/2020 for Acute respiratory failure with hypoxia ([place]) [P71.06] Acute diastolic CHF (congestive heart failure) ([place]) [I50.31] Acute on chronic congestive heart failure, unspecified heart failure type ([place]) [I50.9]  1. Acute renal failure on chronic kidney disease stage IIIB with proteinuria: baseline creatinine of 1.79, GFR of 35 on 12/05/19. Chronic Kidney disease secondary to diabetes and hypertension Acute renal failure secondary to acute cardiorenal syndrome.  No acute indication for dialysis.  Not on an ACE-I/ARB at home.   2. Hypertension with acute exacerbation of diastolic congestive heart failure. Now with metabolic alkalosis.  - Continue home regimen of amlodipine, isosorbide mononitrate, metoprolol and tamsulosin.  - Discharge on torsemide to 10mg  daily.   3. Anemia with chronic kidney  disease: with iron deficiency: normocytic. Status post PRBC transfusion.  Not currently a candidate for ESA with malignancy.  - continue PO iron supplements.   4. Diabetes mellitus type II with chronic kidney disease: insulin dependent. Hemoglobin A1c of 5.2%.   Will need nephrology follow up in 1-3 weeks.    LOS: 7 Brett [name] 8/13/202111:15 AM

## 2020-07-11 NOTE — Progress Notes (Signed)
SATURATION QUALIFICATIONS: (This note is used to comply with regulatory documentation for home oxygen)  Patient Saturations on Room Air at Rest = 85 %  Patient Saturations on Room Air while Ambulating = 87%  Patient Saturations on 2 Liters of oxygen while Ambulating = 93%

## 2020-07-20 NOTE — Progress Notes (Deleted)
Patient ID: Brett [name], male    DOB: Apr 19, 1935, 85 y.o.   MRN: [id number]  HPI  Mr Vandeberg is a 85 y/o male with a history of PVD, previous tobacco use, MR, anemia, hyperlipidemia, HTN, DM, CAD, COPD, carotid stenosis and chronic heart failure.  Echo report from 04/05/20 reviewed and showed an EF of 50-55% along with moderately elevated PA pressure, mild/moderate LAE and trivial MR.    Had a cardiac catheterization done on 07/13/16   Mid LAD to Dist LAD lesion, 40 %stenosed.  Prox Cx to Dist Cx lesion, 30 %stenosed.  Mid RCA to Dist RCA lesion, 30 %stenosed.                    Hemodynamic findings consistent with pulmonary hypertension.  Admitted on 07/04/20 due to shortness of breath. Initially given IV lasix with transition to oral diuretics. Needed oxygen and then placed on nonbreather mask. Given 1 unit of PRBC's along with oral iron. Plavix held while admitted and then resumed. PT/OT evaluations done. Discharged after 7 days.   He presents today for a follow-up visit although hasn't been seen since January 2018. He presents with a chief complaint of  Past Medical History:  Diagnosis Date  . Anemia   . Aortic regurgitation   . B-cell lymphoma ([place]) 2009   DX AT [facility]  . Bronchitis   . CAD (coronary artery disease)   . Carotid stenosis   . CHF (congestive heart failure) ([place])   . Diabetes mellitus without complication ([place])   . Diabetes mellitus, type 2 ([place])   . Emphysema of lung ([place])   . Essential hypertension   . History of chemotherapy   . Hyperlipidemia   . Hypertension   . IDA (iron deficiency anemia)   . Leg edema   . Meralgia paraesthetica   . Mitral regurgitation   . PUD (peptic ulcer disease)   . PVD (peripheral vascular disease) ([place])   . Tobacco abuse     Past Surgical History:  Procedure Laterality Date  . CARDIAC CATHETERIZATION  08/15/2007  . CARDIAC CATHETERIZATION Right 07/13/2016   Procedure: Right/Left Heart Cath and Coronary Angiography;   Surgeon: Dionisio David, MD;  Location: [facility];  Service: Cardiovascular;  Laterality: Right;  . COLONOSCOPY  03/2013  . ESOPHAGOGASTRODUODENOSCOPY  03/2013   Family History  Problem Relation Age of Onset  . CAD Mother   . Rheum arthritis Neg Hx   . Osteoarthritis Neg Hx   . Asthma Neg Hx   . Diabetes Neg Hx   . Cancer Neg Hx    Social History   Tobacco Use  . Smoking status: Former Smoker    Types: Cigarettes    Quit date: 08/30/2015    Years since quitting: 4.8  . Smokeless tobacco: Never Used  Substance Use Topics  . Alcohol use: No   No Known Allergies      Review of Systems  Constitutional: Positive for fatigue. Negative for appetite change.  HENT: Positive for hearing loss. Negative for congestion, postnasal drip and sore throat.   Eyes: Negative.   Respiratory: Positive for shortness of breath (minimal). Negative for cough, chest tightness and wheezing.   Cardiovascular: Negative for chest pain, palpitations and leg swelling.  Gastrointestinal: Negative for abdominal distention and abdominal pain.  Endocrine: Negative.   Genitourinary: Negative.   Musculoskeletal: Negative for back pain and neck pain.  Skin: Negative.   Allergic/Immunologic: Negative.   Neurological: Negative for  dizziness and light-headedness.  Hematological: Negative for adenopathy. Does not bruise/bleed easily.  Psychiatric/Behavioral: Positive for sleep disturbance (chronic difficulty with oxygen at 3L). Negative for dysphoric mood and suicidal ideas. The patient is not nervous/anxious.      Physical Exam Vitals and nursing note reviewed.  Constitutional:      Appearance: He is well-developed.  HENT:     Head: Normocephalic and atraumatic.     Right Ear: Decreased hearing noted.     Left Ear: Decreased hearing noted.  Eyes:     Conjunctiva/sclera: Conjunctivae normal.     Pupils: Pupils are equal, round, and reactive to light.  Neck:     Vascular: No JVD.   Cardiovascular:     Rate and Rhythm: Normal rate and regular rhythm.  Pulmonary:     Effort: Pulmonary effort is normal.     Breath sounds: No wheezing or rales.  Abdominal:     General: There is no distension.     Palpations: Abdomen is soft.     Tenderness: There is no abdominal tenderness.  Musculoskeletal:        General: No tenderness.     Cervical back: Normal range of motion and neck supple.  Skin:    General: Skin is warm and dry.  Neurological:     Mental Status: He is alert and oriented to person, place, and time.  Psychiatric:        Behavior: Behavior normal.        Thought Content: Thought content normal.     Assessment & Plan:  1: Chronic heart failure with preserved ejection fraction- - NYHA Class II - euvolemic today - continuing to weigh daily Reminded to call for an overnight weight gain of >2 pounds or a weekly weight gain of >5 pounds.  - sees cardiologist [name])  - BNP 07/04/20 was   2: HTN-  - BP  - saw his PCP [name]) 04/24/20 - BMP 07/11/20 reviewed and showed sodium 140, potassium 4.1, creatinine 3.24 and GFR 17  3: COPD- - Wearing oxygen at 3L only at bedtime  - No wheezing heard

## 2020-07-21 ENCOUNTER — Ambulatory Visit: Payer: Medicare PPO | Admitting: Family

## 2020-08-01 ENCOUNTER — Ambulatory Visit: Payer: Medicare PPO | Admitting: Family

## 2020-08-01 ENCOUNTER — Telehealth: Payer: Self-pay | Admitting: Family

## 2020-08-01 NOTE — Telephone Encounter (Signed)
Patient did not show for his [facility] appointment on 08/01/20. Will attempt to reschedule.

## 2020-10-29 DEATH — deceased

## 2022-04-07 IMAGING — DX DG CHEST 1V PORT
1 series · 1 of 1 positions shown · non-contrast
Comparison: Radiograph 04/04/2020, CT 01/21/2019

CLINICAL DATA: Dyspnea

EXAM:
PORTABLE CHEST 1 VIEW

[chest ap]
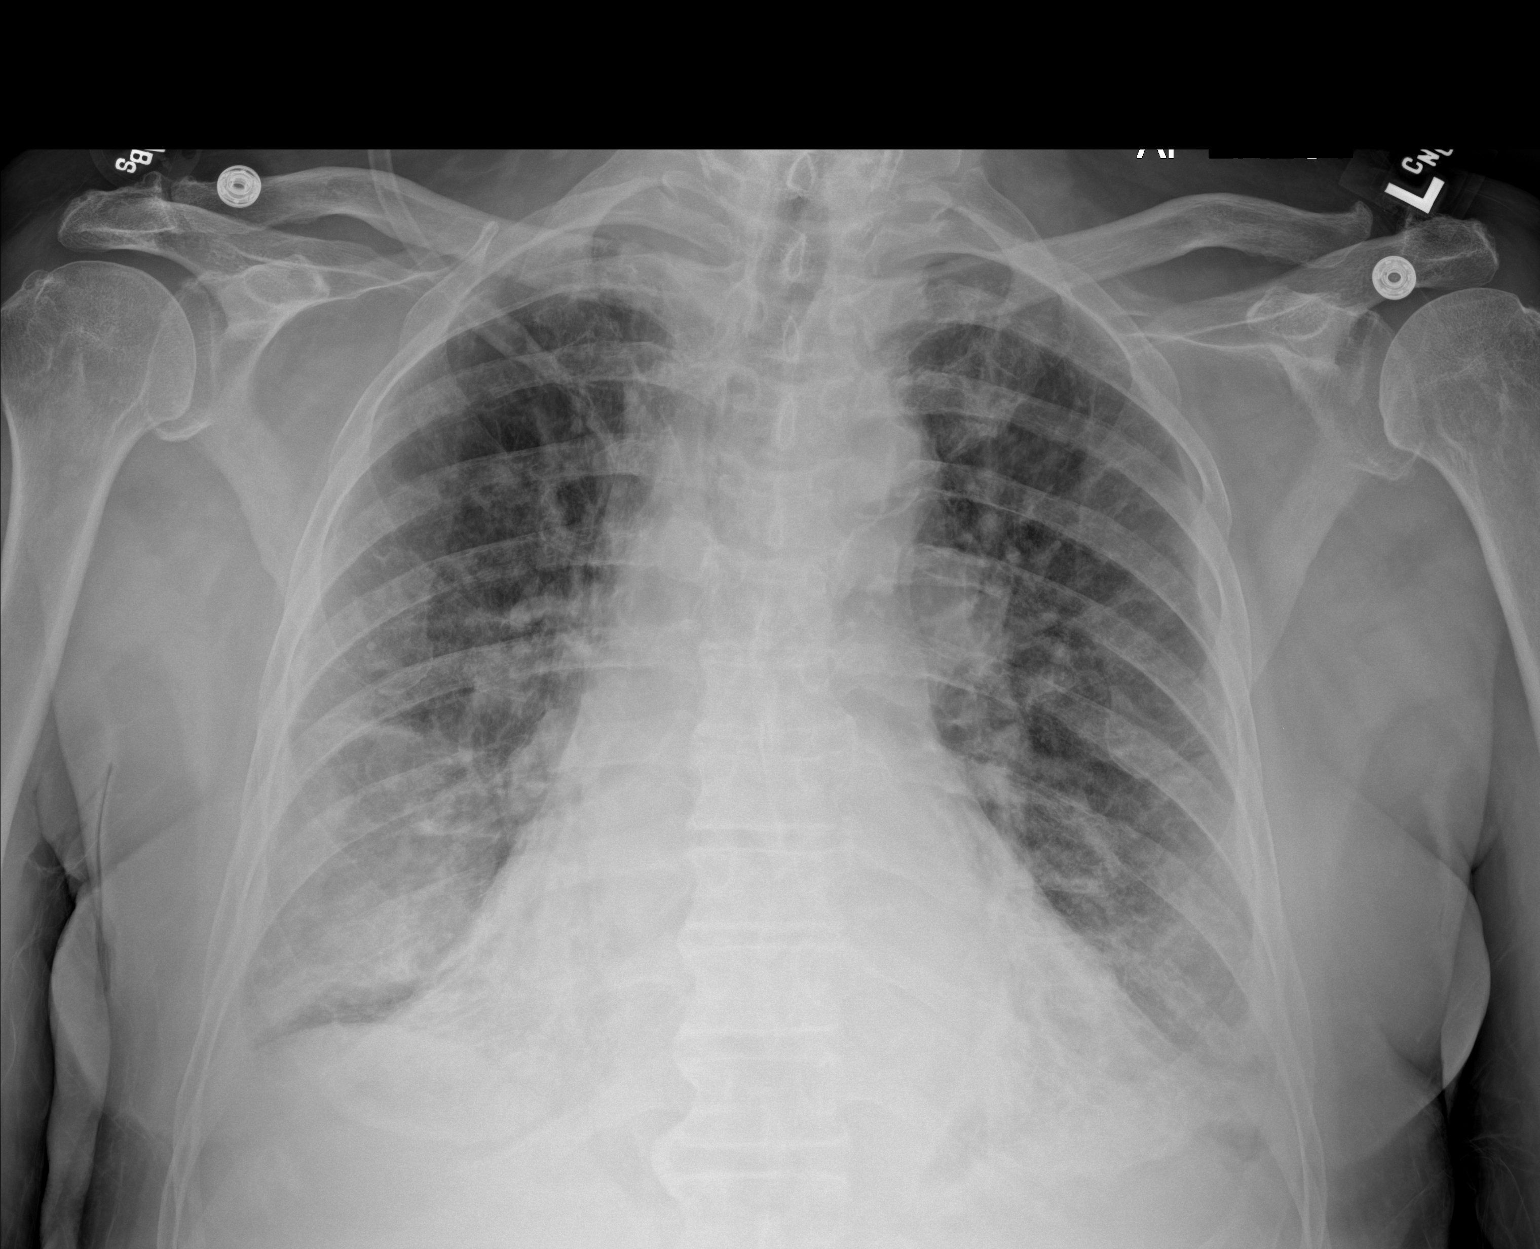

[1 of 1 positions shown; findings below may reference images not displayed]

FINDINGS: Diffuse interstitial opacities throughout the lungs with more
confluent opacity in the right mid to lower lung which is increased
from comparison exams. There is indistinctness of the pulmonary
vascularity as well as fissural thickening with bilateral pleural
effusions. No pneumothorax. Cardiomegaly is similar to prior likely
accentuated by abundant mediastinal fat seen on comparison CT. The
aorta is calcified. The remaining cardiomediastinal contours are
unremarkable. No acute osseous or soft tissue abnormality.
Degenerative changes are present in the imaged spine and shoulders.
Telemetry leads overlie the chest. Surgical clip at the base of the
left neck.
IMPRESSION: 1. Findings suggesting CHF with edema and bilateral effusions. More
confluent opacity in the right mid to lower lung could reflect
alveolar edema or pleural fluid though dense consolidation of
infection could have a similar appearance.
2. Stable cardiomegaly.

## 2022-07-01 IMAGING — DX DG CHEST 1V PORT
1 series · 1 of 1 positions shown · non-contrast
Comparison: 04/13/2020

CLINICAL DATA: Shortness of breath

EXAM:
PORTABLE CHEST 1 VIEW

[chest ap]
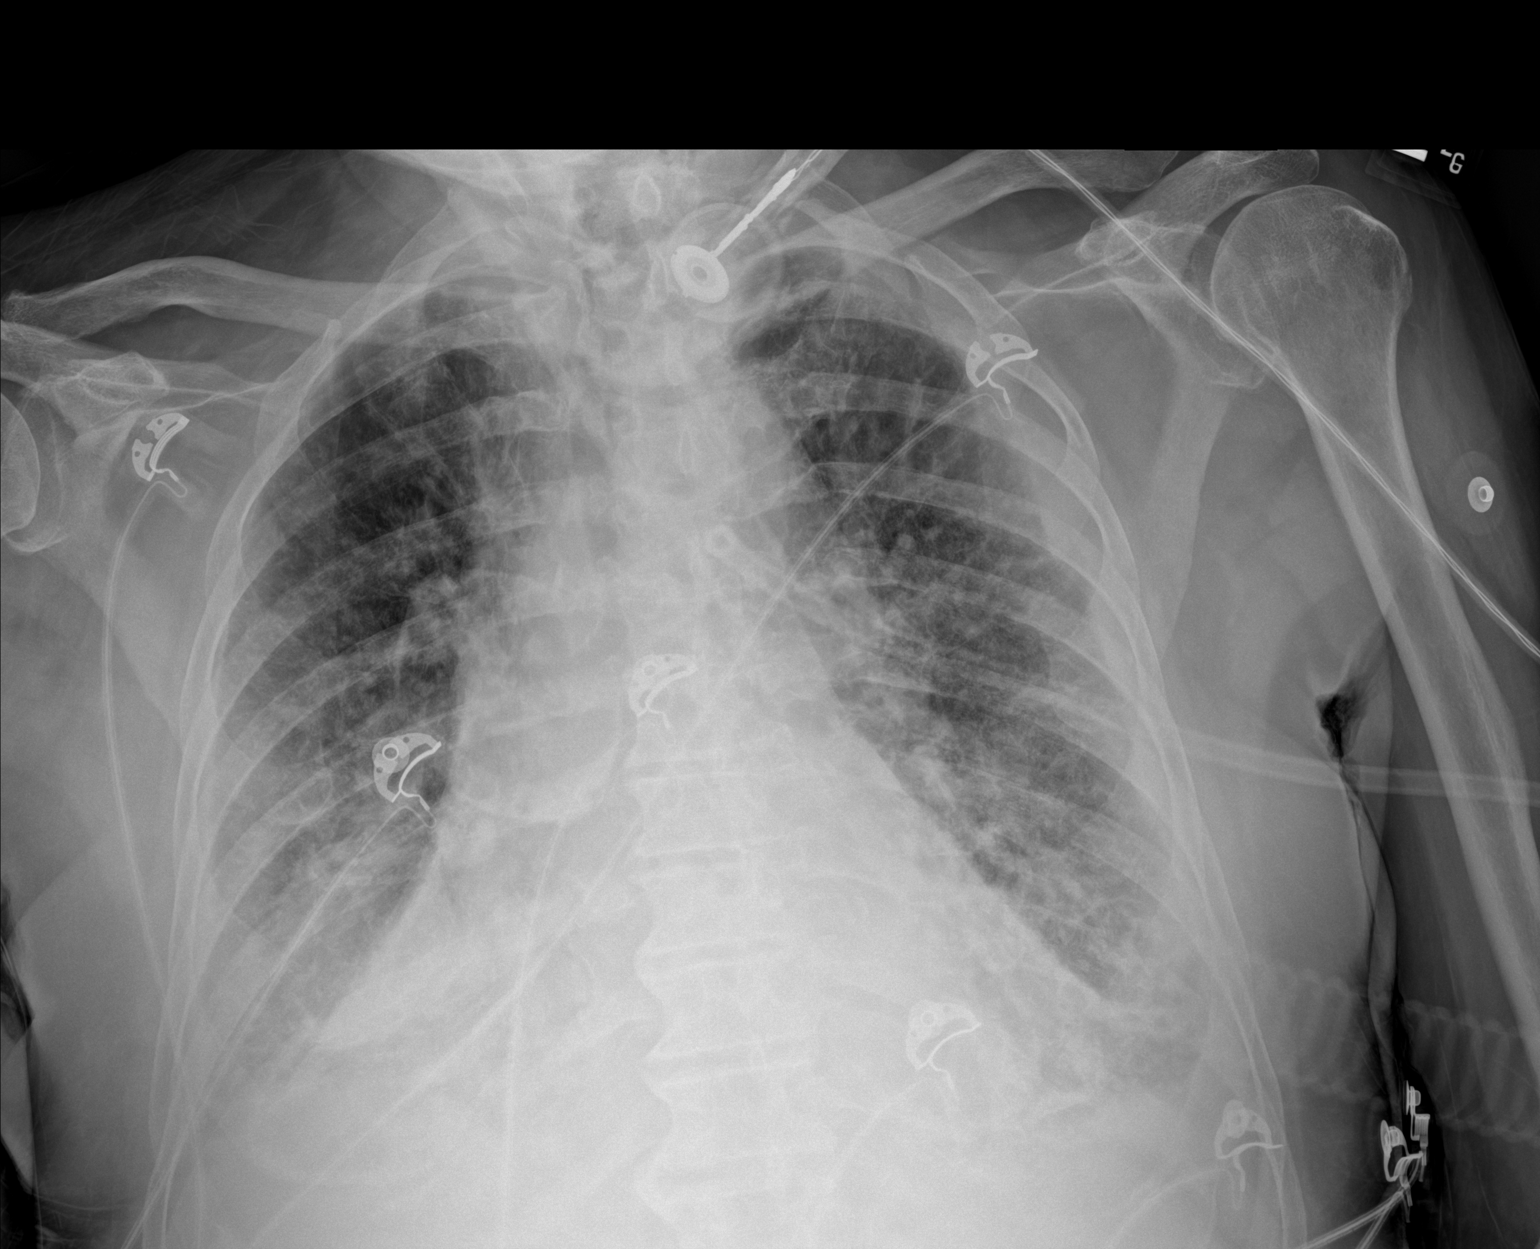

[1 of 1 positions shown; findings below may reference images not displayed]

FINDINGS: Cardiac shadow remains enlarged. Increased central vascular
congestion is noted with small effusions bilaterally. Mild
parenchymal edema is noted as well.
IMPRESSION: Increasing CHF.
